# Patient Record
Sex: Male | Born: 1980 | Race: Black or African American | Hispanic: No | Marital: Married | Smoking: Former smoker
Health system: Southern US, Community
[De-identification: ages and names within clinical notes are randomized; demographics above are authoritative.]

## PROBLEM LIST (undated history)

## (undated) ENCOUNTER — Emergency Department (HOSPITAL_COMMUNITY): Admission: EM | Payer: Self-pay | Source: Home / Self Care

## (undated) DIAGNOSIS — F209 Schizophrenia, unspecified: Secondary | ICD-10-CM

## (undated) DIAGNOSIS — I10 Essential (primary) hypertension: Secondary | ICD-10-CM

---

## 2002-01-07 ENCOUNTER — Encounter: Payer: Self-pay | Admitting: Emergency Medicine

## 2002-01-07 ENCOUNTER — Emergency Department (HOSPITAL_COMMUNITY): Admission: EM | Admit: 2002-01-07 | Discharge: 2002-01-08 | Payer: Self-pay | Admitting: Emergency Medicine

## 2003-02-15 ENCOUNTER — Emergency Department (HOSPITAL_COMMUNITY): Admission: EM | Admit: 2003-02-15 | Discharge: 2003-02-15 | Payer: Self-pay

## 2003-06-10 ENCOUNTER — Inpatient Hospital Stay (HOSPITAL_COMMUNITY): Admission: EM | Admit: 2003-06-10 | Discharge: 2003-06-12 | Payer: Self-pay | Admitting: Emergency Medicine

## 2003-06-16 ENCOUNTER — Encounter: Admission: RE | Admit: 2003-06-16 | Discharge: 2003-08-20 | Payer: Self-pay | Admitting: Orthopedic Surgery

## 2003-08-12 ENCOUNTER — Ambulatory Visit (HOSPITAL_BASED_OUTPATIENT_CLINIC_OR_DEPARTMENT_OTHER): Admission: RE | Admit: 2003-08-12 | Discharge: 2003-08-12 | Payer: Self-pay | Admitting: Orthopedic Surgery

## 2003-08-12 ENCOUNTER — Ambulatory Visit (HOSPITAL_COMMUNITY): Admission: RE | Admit: 2003-08-12 | Discharge: 2003-08-12 | Payer: Self-pay | Admitting: Orthopedic Surgery

## 2003-09-03 ENCOUNTER — Encounter: Admission: RE | Admit: 2003-09-03 | Discharge: 2003-12-02 | Payer: Self-pay | Admitting: Orthopedic Surgery

## 2003-09-23 ENCOUNTER — Emergency Department (HOSPITAL_COMMUNITY): Admission: EM | Admit: 2003-09-23 | Discharge: 2003-09-24 | Payer: Self-pay | Admitting: Emergency Medicine

## 2003-12-21 ENCOUNTER — Emergency Department (HOSPITAL_COMMUNITY): Admission: EM | Admit: 2003-12-21 | Discharge: 2003-12-21 | Payer: Self-pay | Admitting: Emergency Medicine

## 2004-02-01 ENCOUNTER — Emergency Department (HOSPITAL_COMMUNITY): Admission: EM | Admit: 2004-02-01 | Discharge: 2004-02-02 | Payer: Self-pay | Admitting: Emergency Medicine

## 2004-02-11 ENCOUNTER — Emergency Department (HOSPITAL_COMMUNITY): Admission: EM | Admit: 2004-02-11 | Discharge: 2004-02-11 | Payer: Self-pay

## 2004-05-30 ENCOUNTER — Emergency Department (HOSPITAL_COMMUNITY): Admission: EM | Admit: 2004-05-30 | Discharge: 2004-05-30 | Payer: Self-pay | Admitting: Emergency Medicine

## 2004-09-11 ENCOUNTER — Emergency Department (HOSPITAL_COMMUNITY): Admission: EM | Admit: 2004-09-11 | Discharge: 2004-09-11 | Payer: Self-pay | Admitting: Emergency Medicine

## 2004-10-19 ENCOUNTER — Emergency Department (HOSPITAL_COMMUNITY): Admission: EM | Admit: 2004-10-19 | Discharge: 2004-10-19 | Payer: Self-pay | Admitting: Emergency Medicine

## 2004-10-21 ENCOUNTER — Emergency Department (HOSPITAL_COMMUNITY): Admission: EM | Admit: 2004-10-21 | Discharge: 2004-10-21 | Payer: Self-pay | Admitting: Emergency Medicine

## 2004-10-23 ENCOUNTER — Emergency Department (HOSPITAL_COMMUNITY): Admission: EM | Admit: 2004-10-23 | Discharge: 2004-10-23 | Payer: Self-pay | Admitting: Emergency Medicine

## 2004-12-13 ENCOUNTER — Emergency Department (HOSPITAL_COMMUNITY): Admission: EM | Admit: 2004-12-13 | Discharge: 2004-12-13 | Payer: Self-pay | Admitting: Emergency Medicine

## 2005-03-29 IMAGING — CR DG ANKLE COMPLETE 3+V*R*
2 series · 2 of 2 positions shown · non-contrast
Comparison: 09/24/03.

CLINICAL DATA: Injury to ankle with pain, swelling.
 RIGHT ANKLE THREE VIEWS

[view not recorded (1 of 2)]
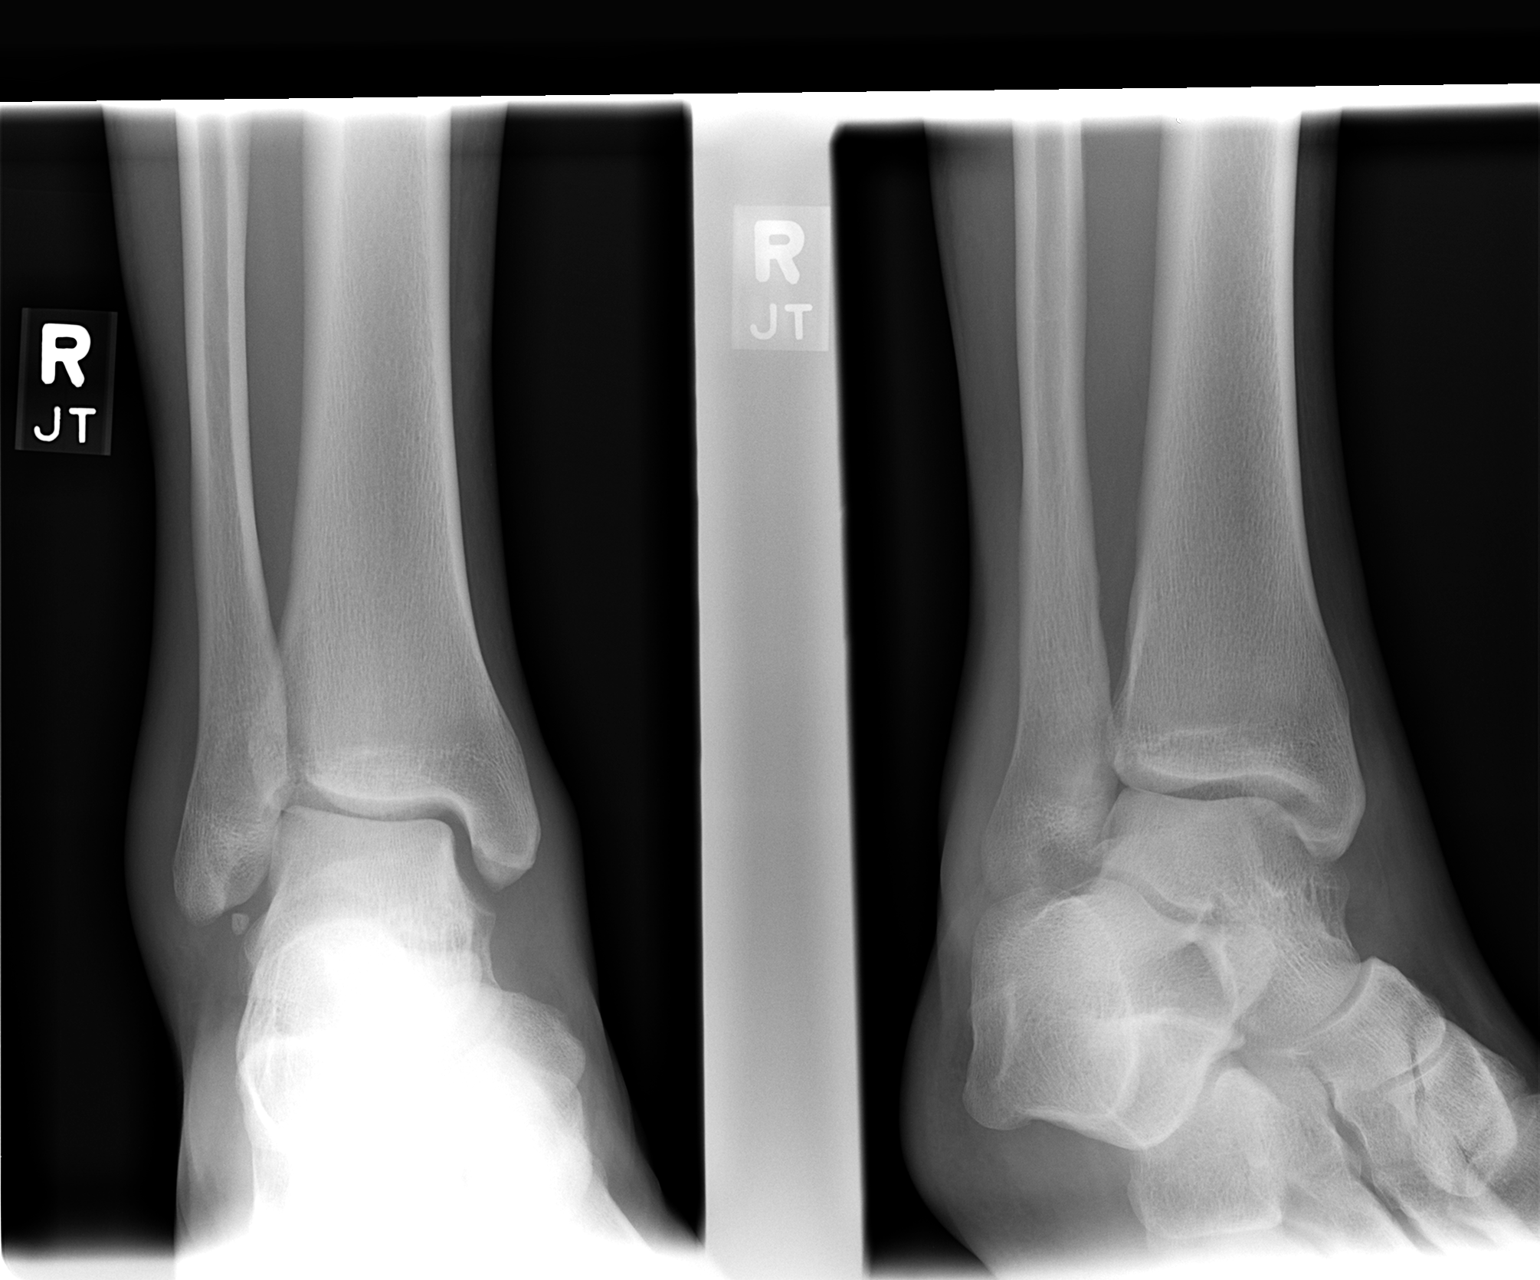

[view not recorded (2 of 2)]
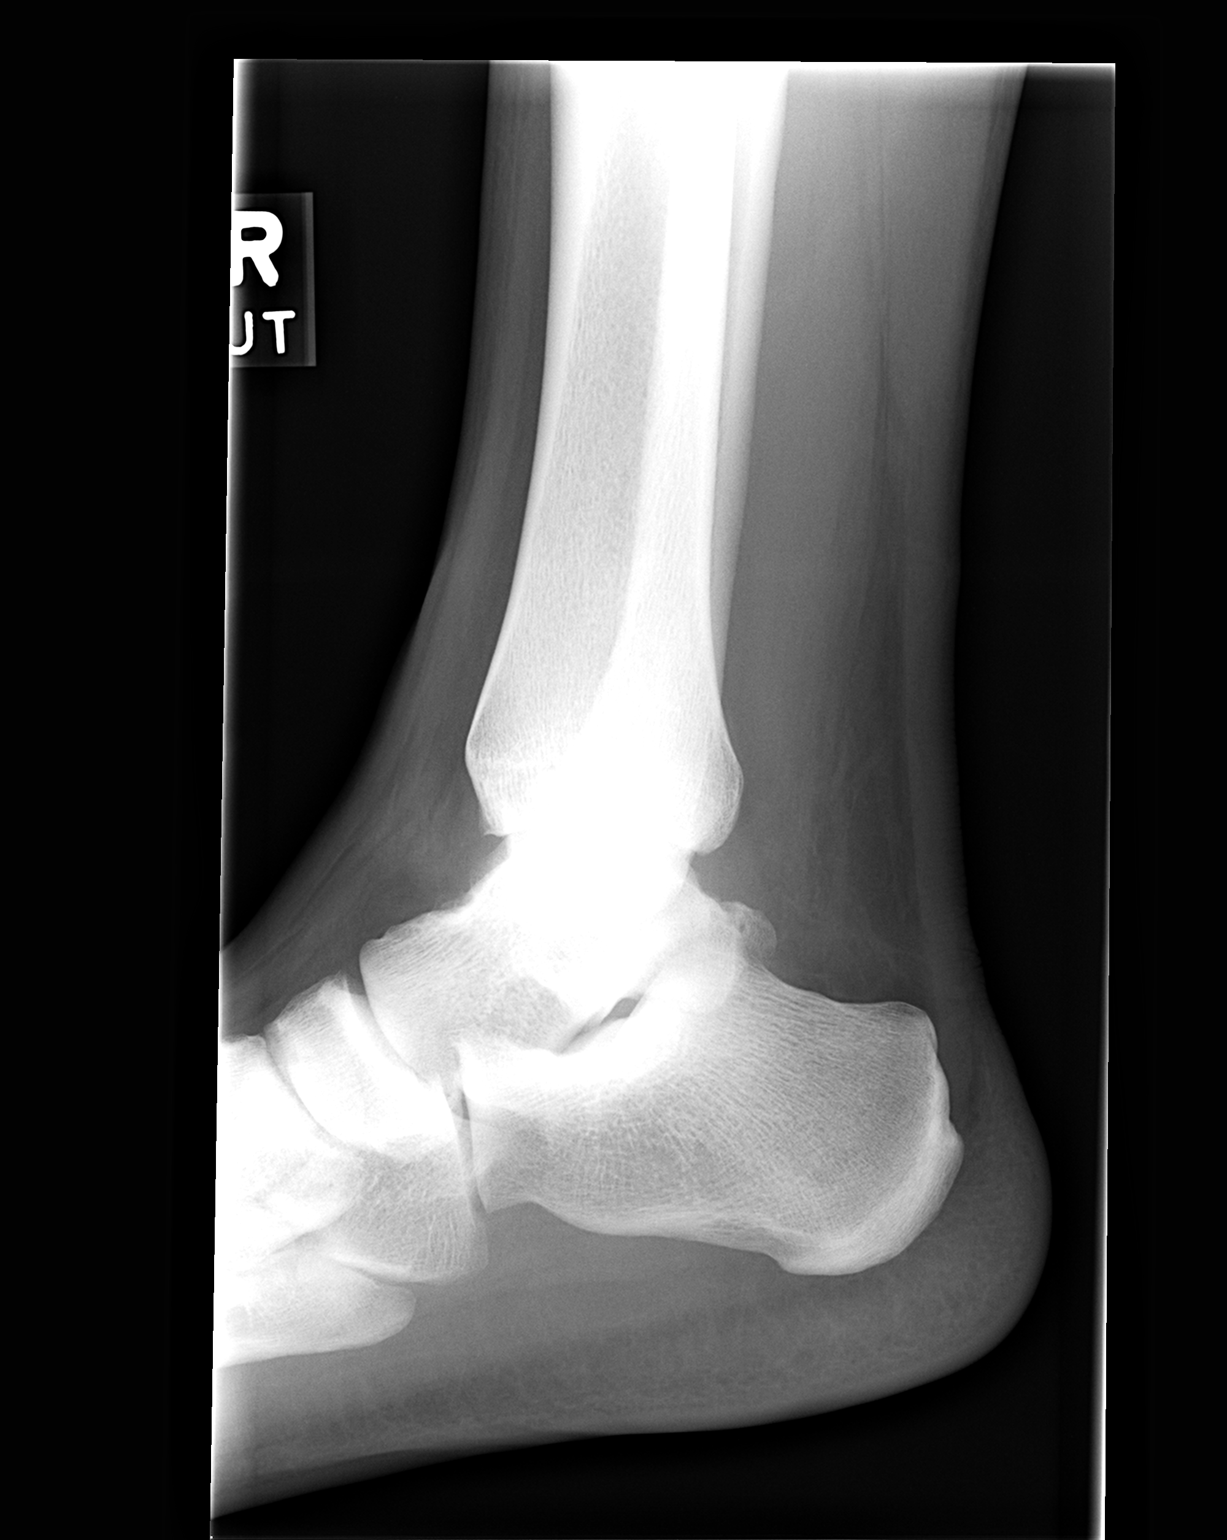

[2 of 2 positions shown; findings below may reference images not displayed]

Soft tissue swelling is noted without evidence of acute fracture, subluxation or dislocation.  Ankle effusion is noted.  Small bony density at the tip of the fibula is unchanged.
 IMPRESSION
 Soft tissue swelling and ankle effusion without evidence of acute bony abnormality.

## 2006-08-06 ENCOUNTER — Emergency Department (HOSPITAL_COMMUNITY): Admission: EM | Admit: 2006-08-06 | Discharge: 2006-08-06 | Payer: Self-pay | Admitting: Emergency Medicine

## 2006-08-20 ENCOUNTER — Emergency Department (HOSPITAL_COMMUNITY): Admission: EM | Admit: 2006-08-20 | Discharge: 2006-08-20 | Payer: Self-pay | Admitting: Emergency Medicine

## 2006-08-21 ENCOUNTER — Emergency Department (HOSPITAL_COMMUNITY): Admission: EM | Admit: 2006-08-21 | Discharge: 2006-08-22 | Payer: Self-pay | Admitting: Emergency Medicine

## 2006-09-24 ENCOUNTER — Emergency Department (HOSPITAL_COMMUNITY): Admission: EM | Admit: 2006-09-24 | Discharge: 2006-09-24 | Payer: Self-pay | Admitting: Emergency Medicine

## 2006-09-30 ENCOUNTER — Emergency Department (HOSPITAL_COMMUNITY): Admission: EM | Admit: 2006-09-30 | Discharge: 2006-09-30 | Payer: Self-pay | Admitting: Emergency Medicine

## 2019-02-20 ENCOUNTER — Encounter (HOSPITAL_COMMUNITY): Payer: Self-pay | Admitting: Emergency Medicine

## 2019-02-20 ENCOUNTER — Other Ambulatory Visit: Payer: Self-pay

## 2019-02-20 ENCOUNTER — Emergency Department (HOSPITAL_COMMUNITY)
Admission: EM | Admit: 2019-02-20 | Discharge: 2019-02-20 | Disposition: A | Discharge status: Home / Self Care | Payer: Self-pay | Attending: Emergency Medicine | Admitting: Emergency Medicine

## 2019-02-20 DIAGNOSIS — F17210 Nicotine dependence, cigarettes, uncomplicated: Secondary | ICD-10-CM | POA: Insufficient documentation

## 2019-02-20 DIAGNOSIS — X58XXXA Exposure to other specified factors, initial encounter: Secondary | ICD-10-CM | POA: Insufficient documentation

## 2019-02-20 DIAGNOSIS — Y929 Unspecified place or not applicable: Secondary | ICD-10-CM | POA: Insufficient documentation

## 2019-02-20 DIAGNOSIS — I10 Essential (primary) hypertension: Secondary | ICD-10-CM | POA: Insufficient documentation

## 2019-02-20 DIAGNOSIS — T18198A Other foreign object in esophagus causing other injury, initial encounter: Secondary | ICD-10-CM | POA: Insufficient documentation

## 2019-02-20 DIAGNOSIS — T18108A Unspecified foreign body in esophagus causing other injury, initial encounter: Secondary | ICD-10-CM

## 2019-02-20 DIAGNOSIS — Y9389 Activity, other specified: Secondary | ICD-10-CM | POA: Insufficient documentation

## 2019-02-20 DIAGNOSIS — Y998 Other external cause status: Secondary | ICD-10-CM | POA: Insufficient documentation

## 2019-02-20 HISTORY — DX: Essential (primary) hypertension: I10

## 2019-02-20 HISTORY — DX: Schizophrenia, unspecified: F20.9

## 2019-02-20 NOTE — ED Notes (Signed)
Pt arrived during downtime. Please refer to paper chart for triage and progress notes.

## 2019-02-20 NOTE — ED Provider Notes (Signed)
   [facility] DEPT Provider Note: Georgena Spurling, MD, FACEP  CSN: [id number] MRN: [id number] ARRIVAL: 02/20/19 at [facility]: RESB/RESB   CHIEF COMPLAINT  Foreign Body in [place]  02/20/19 2:00 AM Aaron Rubio is a 38 y.o. male with a history of schizophrenia.  He states he has a piece of sausage caught in his upper throat.  He is having discomfort associated with this which he rates as an 8 out of 10.  He has not been able to relieve the obstruction despite attempting to drink fluids or by vomiting.  He has had no difficulty breathing and EMS reports he has been talkative prior to arrival.    Past Medical History:  Diagnosis Date  . Hypertension   . Schizophrenia ([place])     History reviewed. No pertinent surgical history.  No family history on file.  Social History   Tobacco Use  . Smoking status: Current Every Day Smoker    Packs/day: 0.30    Types: Cigarettes  Substance Use Topics  . Alcohol use: Not Currently  . Drug use: Not Currently    Prior to Admission medications   Not on File    Allergies Patient has no allergy information on record.   REVIEW OF SYSTEMS  Negative except as noted here or in the History of Present Illness.   PHYSICAL EXAMINATION  Initial Vital Signs Blood pressure (!) 93/53, pulse 74, resp. rate (!) 24, SpO2 97 %.  Examination General: Well-developed, well-nourished male in no acute distress; appearance consistent with age of record HENT: normocephalic; atraumatic; no dysphonia; no stridor; no foreign body seen in oropharynx Eyes: Normal appearance Neck: supple Heart: regular rate and rhythm Lungs: clear to auscultation bilaterally Abdomen: soft; nondistended; nontender; bowel sounds present Extremities: No deformity; full range of motion Neurologic: Awake, alert; motor function intact in all extremities and symmetric; no facial droop Skin: Warm and dry Psychiatric: Mildly agitated    RESULTS  Summary of this visit's results, reviewed by myself:   EKG Interpretation  Date/Time:    Ventricular Rate:    PR Interval:    QRS Duration:   QT Interval:    QTC Calculation:   R Axis:     Text Interpretation:        Laboratory Studies: No results found for this or any previous visit (from the past 24 hour(s)). Imaging Studies: No results found.  ED COURSE and MDM  Nursing notes and initial vitals signs, including pulse oximetry, reviewed.  Vitals:   02/20/19 0537  BP: (!) 93/53  Pulse: 74  Resp: (!) 24  SpO2: 97%   6:08 AM Patient's obstruction relieved with IV glucagon.  He is now sleeping peacefully and has been able to drink fluids without difficulty.  PROCEDURES    ED DIAGNOSES     ICD-10-CM   1. Foreign body in esophagus, initial encounter  T18.108A        Shanon Rosser, MD 02/20/19 [phone]

## 2019-02-20 NOTE — ED Notes (Signed)
Pt given water. Able to tolerate well.

## 2021-07-14 ENCOUNTER — Emergency Department (HOSPITAL_COMMUNITY)
Admission: EM | Admit: 2021-07-14 | Discharge: 2021-07-15 | Disposition: A | Discharge status: Home / Self Care | Payer: Self-pay | Attending: Emergency Medicine | Admitting: Emergency Medicine

## 2021-07-14 ENCOUNTER — Other Ambulatory Visit: Payer: Self-pay

## 2021-07-14 ENCOUNTER — Emergency Department (HOSPITAL_COMMUNITY): Payer: Self-pay

## 2021-07-14 ENCOUNTER — Encounter (HOSPITAL_COMMUNITY): Payer: Self-pay

## 2021-07-14 DIAGNOSIS — R42 Dizziness and giddiness: Secondary | ICD-10-CM | POA: Insufficient documentation

## 2021-07-14 DIAGNOSIS — T148XXA Other injury of unspecified body region, initial encounter: Secondary | ICD-10-CM

## 2021-07-14 DIAGNOSIS — X58XXXA Exposure to other specified factors, initial encounter: Secondary | ICD-10-CM | POA: Insufficient documentation

## 2021-07-14 DIAGNOSIS — S90829A Blister (nonthermal), unspecified foot, initial encounter: Secondary | ICD-10-CM

## 2021-07-14 DIAGNOSIS — S90821A Blister (nonthermal), right foot, initial encounter: Secondary | ICD-10-CM | POA: Insufficient documentation

## 2021-07-14 DIAGNOSIS — S90822A Blister (nonthermal), left foot, initial encounter: Secondary | ICD-10-CM | POA: Insufficient documentation

## 2021-07-14 LAB — CBC WITH DIFFERENTIAL/PLATELET
Abs Immature Granulocytes: 0.1 10*3/uL — ABNORMAL HIGH (ref 0.00–0.07)
Basophils Absolute: 0.1 10*3/uL (ref 0.0–0.1)
Basophils Relative: 0 %
Eosinophils Absolute: 0 10*3/uL (ref 0.0–0.5)
Eosinophils Relative: 0 %
HCT: 46.2 % (ref 39.0–52.0)
Hemoglobin: 14.4 g/dL (ref 13.0–17.0)
Immature Granulocytes: 1 %
Lymphocytes Relative: 8 %
Lymphs Abs: 1.5 10*3/uL (ref 0.7–4.0)
MCH: 25.9 pg — ABNORMAL LOW (ref 26.0–34.0)
MCHC: 31.2 g/dL (ref 30.0–36.0)
MCV: 83.1 fL (ref 80.0–100.0)
Monocytes Absolute: 1.8 10*3/uL — ABNORMAL HIGH (ref 0.1–1.0)
Monocytes Relative: 10 %
Neutro Abs: 14 10*3/uL — ABNORMAL HIGH (ref 1.7–7.7)
Neutrophils Relative %: 81 %
Platelets: 222 10*3/uL (ref 150–400)
RBC: 5.56 MIL/uL (ref 4.22–5.81)
RDW: 13.1 % (ref 11.5–15.5)
WBC: 17.4 10*3/uL — ABNORMAL HIGH (ref 4.0–10.5)
nRBC: 0 % (ref 0.0–0.2)

## 2021-07-14 LAB — BASIC METABOLIC PANEL
Anion gap: 11 (ref 5–15)
BUN: 30 mg/dL — ABNORMAL HIGH (ref 6–20)
CO2: 22 mmol/L (ref 22–32)
Calcium: 9 mg/dL (ref 8.9–10.3)
Chloride: 104 mmol/L (ref 98–111)
Creatinine, Ser: 1.38 mg/dL — ABNORMAL HIGH (ref 0.61–1.24)
GFR, Estimated: >60 mL/min (ref >60)
Glucose, Bld: 98 mg/dL (ref 70–99)
Potassium: 4 mmol/L (ref 3.5–5.1)
Sodium: 137 mmol/L (ref 135–145)

## 2021-07-14 NOTE — ED Provider Triage Note (Signed)
Emergency Medicine Provider Triage Evaluation Note  Aaron Rubio , a 41 y.o. male  was evaluated in triage.  Pt complains of bilateral leg/foot pain.  Patient states that he was released from jail yesterday around 5:08 PM and walked here from either [place] or [place].  He states while walking he began experiencing pain.  Patient does not elaborate why he was walking here however states that once he started having pain he knew he needed to come to this specific ED for further evaluation of his pain.  He also states that he needs medications.  He states he needs 1 for sleep and 1 for blood pressure however he is unable to tell me what medication he is on.  He states that he was not released from jail with his medications and that someone needs to call to figure out what medicines that he is on.   Review of Systems  Positive: + foot/leg pain Negative: Unable to really decipher ROS. PT poor historian  Physical Exam  BP (!) 159/103 (BP Location: Left Arm)    Pulse (!) 134    Resp 18    SpO2 100%  Gen:   Awake, no distress   Resp:  Normal effort  MSK:   Moves extremities without difficulty  Other:  Blister noted to R toe between 1st and 2nd digits. Mild TTP throughout bilateral feet. 2+ DP Pulses bilaterally.  Tachycardic.   Medical Decision Making  Medically screening exam initiated at 4:47 PM.  Appropriate orders placed.  Wilmar Prabhakar was informed that the remainder of the evaluation will be completed by another provider, this initial triage assessment does not replace that evaluation, and the importance of remaining in the ED until their evaluation is complete.     Tanda Rockers, PA-C 07/14/21 1649

## 2021-07-14 NOTE — ED Triage Notes (Signed)
Pt reports he was released from a [place] jail yesterday 5pm and has been walking to [place] since yesterday. He states around 3 hours of walking yesterday evening he started having leg and foot pain and states it now hurts to walk and reports bruising to his feet. No bruises noted on his feet during triage assessment. Pt continues to change his story, hard to decipher what he is complaining of.

## 2021-07-14 NOTE — ED Provider Notes (Addendum)
[facility]-EMERGENCY DEPT Provider Note   CSN: [id number] Arrival date & time: 07/14/21  1531     History  Chief Complaint  Patient presents with   Dizziness    Leg pain   Leg Pain    Aaron Rubio is a 41 y.o. male.  HPI Patient presents to the ED after walking to [facility] from [facility].  Initially he complained about foot pain.  Later he stated that he was here just to get his medicines.  He was released from jail yesterday.  He cannot recall which medicines he is supposed to take.    Home Medications Prior to Admission medications   Not on File      Allergies    Patient has no known allergies.    Review of Systems   Review of Systems  All other systems reviewed and are negative.  Physical Exam Updated Vital Signs BP 97/60    Pulse (!) 102    Temp 98.5 F (36.9 C) (Oral)    Resp 18    Ht 6\' 2"  (1.88 m)    Wt 79.8 kg    SpO2 99%    BMI 22.60 kg/m  Physical Exam Vitals and nursing note reviewed.  Constitutional:      General: He is not in acute distress.    Appearance: He is well-developed. He is not ill-appearing or diaphoretic.  HENT:     Head: Normocephalic and atraumatic.     Right Ear: External ear normal.     Left Ear: External ear normal.  Eyes:     Conjunctiva/sclera: Conjunctivae normal.     Pupils: Pupils are equal, round, and reactive to light.  Neck:     Trachea: Phonation normal.  Cardiovascular:     Rate and Rhythm: Normal rate.  Pulmonary:     Effort: Pulmonary effort is normal.  Abdominal:     General: There is no distension.     Tenderness: There is abdominal tenderness.  Musculoskeletal:        General: Normal range of motion.     Cervical back: Normal range of motion and neck supple.  Skin:    General: Skin is warm and dry.     Comments: He has a blister on the right great toe and left plantar forefoot.  The blister is intact, not bleeding and around 2 to 3 cm.  Neurological:     Mental  Status: He is alert and oriented to person, place, and time.     Cranial Nerves: No cranial nerve deficit.     Sensory: No sensory deficit.     Motor: No abnormal muscle tone.     Coordination: Coordination normal.     Comments: No dysarthria or aphasia.  Psychiatric:        Mood and Affect: Mood normal.        Behavior: Behavior normal.        Thought Content: Thought content normal.        Judgment: Judgment normal.     Comments: No internal responsiveness    ED Results / Procedures / Treatments   Labs (all labs ordered are listed, but only abnormal results are displayed) Labs Reviewed  BASIC METABOLIC PANEL - Abnormal; Notable for the following components:      Result Value   BUN 30 (*)    Creatinine, Ser 1.38 (*)    All other components within normal limits  CBC WITH DIFFERENTIAL/PLATELET - Abnormal; Notable for the  following components:   WBC 17.4 (*)    MCH 25.9 (*)    Neutro Abs 14.0 (*)    Monocytes Absolute 1.8 (*)    Abs Immature Granulocytes 0.10 (*)    All other components within normal limits  URINALYSIS, ROUTINE W REFLEX MICROSCOPIC    EKG None  Radiology DG Foot Complete Left  Result Date: 07/14/2021 CLINICAL DATA:  Pain without trauma EXAM: LEFT FOOT - COMPLETE 3+ VIEW COMPARISON:  None. FINDINGS: Mild hallux valgus deformity. No acute fracture or dislocation. No periosteal reaction or callus deposition. IMPRESSION: No acute osseous abnormality. Electronically Signed   By: Jeronimo Greaves M.D.   On: 07/14/2021 17:30   DG Foot Complete Right  Result Date: 07/14/2021 CLINICAL DATA:  Bilateral foot pain EXAM: RIGHT FOOT COMPLETE - 3+ VIEW COMPARISON:  None. FINDINGS: There is no evidence of fracture or dislocation. There is no evidence of arthropathy or other focal bone abnormality. Soft tissues are unremarkable. IMPRESSION: Negative. Electronically Signed   By: Larose Hires D.O.   On: 07/14/2021 17:32    Procedures Procedures    Medications Ordered in  ED Medications - No data to display  ED Course/ Medical Decision Making/ A&P                           Medical Decision Making Patient complains of painful feet and being out of his medicine.  He has blisters on his feet.  Amount and/or Complexity of Data Reviewed Labs: ordered.    Details: Mild elevation of BUN and creatinine, White count high. No intervention needed. ECG/medicine tests: ordered and independent interpretation performed.    Details: No fractures Discussion of management or test interpretation with external provider(s): Patient was homeless, has walked long distance and arrived to the ED complaining of foot pain and being out of his medicine.  He is stable for discharge after assistance with medicine prescriptions.  Risk Prescription drug management. Diagnosis or treatment significantly limited by social determinants of health. Risk Details: Homelessness.  Required prescriptions.         Final Clinical Impression(s) / ED Diagnoses Final diagnoses:  Blister of foot, unspecified laterality, initial encounter    Rx / DC Orders ED Discharge Orders     None         Mancel Bale, MD 07/15/21 [phone]

## 2021-07-15 ENCOUNTER — Other Ambulatory Visit: Payer: Self-pay

## 2021-07-15 ENCOUNTER — Emergency Department (HOSPITAL_COMMUNITY)
Admission: EM | Admit: 2021-07-15 | Discharge: 2021-07-15 | Disposition: A | Discharge status: Home / Self Care | Payer: Self-pay | Attending: Emergency Medicine | Admitting: Emergency Medicine

## 2021-07-15 ENCOUNTER — Encounter (HOSPITAL_COMMUNITY): Payer: Self-pay

## 2021-07-15 DIAGNOSIS — E860 Dehydration: Secondary | ICD-10-CM | POA: Insufficient documentation

## 2021-07-15 DIAGNOSIS — R238 Other skin changes: Secondary | ICD-10-CM

## 2021-07-15 MED ORDER — BUSPIRONE HCL 30 MG PO TABS: 30.0000 mg | ORAL_TABLET | Freq: Two times a day (BID) | ORAL | 0 refills | Status: AC

## 2021-07-15 MED ORDER — BENZTROPINE MESYLATE 2 MG PO TABS: 2.0000 mg | ORAL_TABLET | Freq: Two times a day (BID) | ORAL | 0 refills | Status: DC

## 2021-07-15 MED ORDER — HYDROXYZINE HCL 25 MG PO TABS: 25.0000 mg | ORAL_TABLET | ORAL | 0 refills | Status: DC

## 2021-07-15 MED ORDER — METOPROLOL TARTRATE 25 MG PO TABS: 25.0000 mg | ORAL_TABLET | Freq: Every morning | ORAL | 0 refills | Status: DC

## 2021-07-15 NOTE — ED Notes (Signed)
Prescriptions reviewed along with discharge information.

## 2021-07-15 NOTE — ED Provider Notes (Signed)
[facility] DEPT Provider Note   CSN: [id number] Arrival date & time: 07/15/21  2115     History  Chief Complaint  Patient presents with   Dehydration    Graciano Nixson Butta is a 41 y.o. male.  41 year old male returns the emergency room with complaint of blisters to his toes, states that he has delicate skin.  Patient states he is recently released from jail and has nowhere to go.  Patient reports poor hydration and nutrition for several years.  Currently drinking a soda.  Seen in the emergency room and discharged early this morning.      Home Medications Prior to Admission medications   Medication Sig Start Date End Date Taking? Authorizing Provider  benztropine (COGENTIN) 2 MG tablet Take 1 tablet (2 mg total) by mouth 2 (two) times daily. 07/15/21 08/14/21  Long, Wonda Olds, MD  busPIRone (BUSPAR) 30 MG tablet Take 1 tablet (30 mg total) by mouth 2 (two) times daily. 07/15/21 08/14/21  Long, Wonda Olds, MD  hydrOXYzine (ATARAX) 25 MG tablet Take 1-2 tablets (25-50 mg total) by mouth See admin instructions. Take 25 mg by mouth in the morning and 50 mg at bedtime 07/15/21   Long, Wonda Olds, MD  metoprolol tartrate (LOPRESSOR) 25 MG tablet Take 1 tablet (25 mg total) by mouth in the morning. 07/15/21 08/14/21  Long, Wonda Olds, MD      Allergies    Patient has no known allergies.    Review of Systems   Review of Systems  Constitutional:  Negative for fever.  Respiratory:  Negative for shortness of breath.   Cardiovascular:  Negative for chest pain.  Gastrointestinal:  Negative for abdominal pain, nausea and vomiting.  Musculoskeletal:  Negative for arthralgias and myalgias.  Skin:  Positive for wound.  Allergic/Immunologic: Negative for immunocompromised state.  Neurological:  Negative for weakness.  Hematological:  Negative for adenopathy.  Psychiatric/Behavioral:  Negative for confusion.    Physical Exam Updated Vital Signs BP (!) 153/106 (BP  Location: Right Arm)    Pulse (!) 114    Temp 97.8 F (36.6 C) (Oral)    Resp 20    Ht 6\' 2"  (1.88 m)    Wt 81.6 kg    SpO2 100%    BMI 23.11 kg/m  Physical Exam Vitals and nursing note reviewed.  Constitutional:      General: He is not in acute distress.    Appearance: He is well-developed. He is not diaphoretic.  HENT:     Head: Normocephalic and atraumatic.  Cardiovascular:     Pulses: Normal pulses.  Pulmonary:     Effort: Pulmonary effort is normal.  Skin:    General: Skin is warm and dry.     Findings: No erythema or rash.     Comments: Blister to left and right great toe without evidence of secondary infection.  Neurological:     Mental Status: He is alert and oriented to person, place, and time.     Motor: No weakness.     Gait: Gait normal.  Psychiatric:        Behavior: Behavior normal.    ED Results / Procedures / Treatments   Labs (all labs ordered are listed, but only abnormal results are displayed) Labs Reviewed - No data to display  EKG None  Radiology DG Foot Complete Left  Result Date: 07/14/2021 CLINICAL DATA:  Pain without trauma EXAM: LEFT FOOT - COMPLETE 3+ VIEW COMPARISON:  None. FINDINGS: Mild hallux  valgus deformity. No acute fracture or dislocation. No periosteal reaction or callus deposition. IMPRESSION: No acute osseous abnormality. Electronically Signed   By: Abigail Miyamoto M.D.   On: 07/14/2021 17:30   DG Foot Complete Right  Result Date: 07/14/2021 CLINICAL DATA:  Bilateral foot pain EXAM: RIGHT FOOT COMPLETE - 3+ VIEW COMPARISON:  None. FINDINGS: There is no evidence of fracture or dislocation. There is no evidence of arthropathy or other focal bone abnormality. Soft tissues are unremarkable. IMPRESSION: Negative. Electronically Signed   By: Keane Police D.O.   On: 07/14/2021 17:32    Procedures Procedures    Medications Ordered in ED Medications - No data to display  ED Course/ Medical Decision Making/ A&P                            Medical Decision Making 41 year old male returns to the ER with ongoing complaint of blisters to his feet.  X-ray reviewed from yesterday, no acute osseous abnormalities.  Plan is to dress his wounds, provide socks and discharge with shelter resource list.  Patient states that he is here for dinner as he has nowhere else to go.          Final Clinical Impression(s) / ED Diagnoses Final diagnoses:  Blisters of multiple sites    Rx / DC Orders ED Discharge Orders     None         Roque Lias 07/15/21 2212    Daleen Bo, MD 07/15/21 2226

## 2021-07-15 NOTE — Discharge Instructions (Signed)
Try to wear socks and supportive shoes to help your blisters improve.  Follow-up with the doctor of your choice for further care and treatment as needed.

## 2021-07-15 NOTE — ED Triage Notes (Addendum)
Patient feeling dehydrated. Was seen yesterday. He blistered his feet very badly. He is trying to get stomach soother too. He is asking if we fed dinner for tonight yet.

## 2021-07-15 NOTE — ED Provider Notes (Signed)
Blood pressure (!) 95/52, pulse (!) 103, temperature 98.5 F (36.9 C), temperature source Oral, resp. rate 18, height 6\' 2"  (1.88 m), weight 79.8 kg, SpO2 98 %.  Assuming care from Dr. .  In short, Aaron Rubio is a 41 y.o. male with a chief complaint of Dizziness (Leg pain) and Leg Pain .  Refer to the original H&P for additional details.  The current plan of care is to follow up on medication list from jail and d/c with Rx and PCP follow up information.  04:00 AM  Patient's med list faxed over and added to our system. Provided Rx for 30 days of medication and contact information for local PCP. Patient is stable for discharge.     41, MD 07/15/21 [phone]

## 2021-07-26 DIAGNOSIS — I10 Essential (primary) hypertension: Secondary | ICD-10-CM | POA: Insufficient documentation

## 2021-10-19 DIAGNOSIS — F1590 Other stimulant use, unspecified, uncomplicated: Secondary | ICD-10-CM | POA: Insufficient documentation

## 2022-05-10 ENCOUNTER — Encounter (HOSPITAL_COMMUNITY): Payer: Self-pay

## 2022-05-10 ENCOUNTER — Other Ambulatory Visit: Payer: Self-pay

## 2022-05-10 ENCOUNTER — Emergency Department (HOSPITAL_COMMUNITY)
Admission: EM | Admit: 2022-05-10 | Discharge: 2022-05-10 | Discharge status: Against Medical Advice | Payer: Self-pay | Attending: Emergency Medicine | Admitting: Emergency Medicine

## 2022-05-10 DIAGNOSIS — W1839XA Other fall on same level, initial encounter: Secondary | ICD-10-CM | POA: Insufficient documentation

## 2022-05-10 DIAGNOSIS — F1490 Cocaine use, unspecified, uncomplicated: Secondary | ICD-10-CM | POA: Insufficient documentation

## 2022-05-10 DIAGNOSIS — Z5321 Procedure and treatment not carried out due to patient leaving prior to being seen by health care provider: Secondary | ICD-10-CM | POA: Insufficient documentation

## 2022-05-10 LAB — CBC
HCT: 42.4 % (ref 39.0–52.0)
Hemoglobin: 13.8 g/dL (ref 13.0–17.0)
MCH: 27.1 pg (ref 26.0–34.0)
MCHC: 32.5 g/dL (ref 30.0–36.0)
MCV: 83.3 fL (ref 80.0–100.0)
Platelets: 262 10*3/uL (ref 150–400)
RBC: 5.09 MIL/uL (ref 4.22–5.81)
RDW: 13 % (ref 11.5–15.5)
WBC: 10.7 10*3/uL — ABNORMAL HIGH (ref 4.0–10.5)
nRBC: 0 % (ref 0.0–0.2)

## 2022-05-10 LAB — COMPREHENSIVE METABOLIC PANEL
ALT: 27 U/L (ref 0–44)
AST: 48 U/L — ABNORMAL HIGH (ref 15–41)
Albumin: 4.2 g/dL (ref 3.5–5.0)
Alkaline Phosphatase: 71 U/L (ref 38–126)
Anion gap: 6 (ref 5–15)
BUN: 27 mg/dL — ABNORMAL HIGH (ref 6–20)
CO2: 28 mmol/L (ref 22–32)
Calcium: 9.3 mg/dL (ref 8.9–10.3)
Chloride: 107 mmol/L (ref 98–111)
Creatinine, Ser: 1.43 mg/dL — ABNORMAL HIGH (ref 0.61–1.24)
GFR, Estimated: >60 mL/min (ref >60)
Glucose, Bld: 123 mg/dL — ABNORMAL HIGH (ref 70–99)
Potassium: 4.5 mmol/L (ref 3.5–5.1)
Sodium: 141 mmol/L (ref 135–145)
Total Bilirubin: 0.6 mg/dL (ref 0.3–1.2)
Total Protein: 6.8 g/dL (ref 6.5–8.1)

## 2022-05-10 LAB — ETHANOL: Alcohol, Ethyl (B): <10 mg/dL (ref <10)

## 2022-05-10 NOTE — ED Triage Notes (Signed)
[facility] Staff called because patient was acting strange in the lobby. Pt admits to using cocaine. Pt having auditory and visual hallucinations. No aggressive behavior noted. VSS for EMS. Pt reports hurting all over and took the cocaine to help it. EMS states patient had a fall at ground level.

## 2022-05-10 NOTE — ED Notes (Signed)
Pt is not present in the lobby for vital reassessment. Pt called x3.

## 2022-05-10 NOTE — ED Notes (Signed)
Pt seen exiting the ED lobby.

## 2022-05-10 NOTE — ED Notes (Signed)
Pt prompted/reminded urine sample needed.

## 2022-05-11 ENCOUNTER — Emergency Department (HOSPITAL_COMMUNITY): Payer: Self-pay

## 2022-05-11 ENCOUNTER — Other Ambulatory Visit: Payer: Self-pay

## 2022-05-11 ENCOUNTER — Emergency Department (HOSPITAL_COMMUNITY)
Admission: EM | Admit: 2022-05-11 | Discharge: 2022-05-11 | Discharge status: Against Medical Advice | Payer: Self-pay | Attending: Emergency Medicine | Admitting: Emergency Medicine

## 2022-05-11 ENCOUNTER — Emergency Department (HOSPITAL_COMMUNITY)
Admission: EM | Admit: 2022-05-11 | Discharge: 2022-05-11 | Disposition: A | Discharge status: Home / Self Care | Payer: Self-pay | Attending: Emergency Medicine | Admitting: Emergency Medicine

## 2022-05-11 ENCOUNTER — Encounter (HOSPITAL_COMMUNITY): Payer: Self-pay | Admitting: *Deleted

## 2022-05-11 DIAGNOSIS — E860 Dehydration: Secondary | ICD-10-CM | POA: Insufficient documentation

## 2022-05-11 DIAGNOSIS — W19XXXA Unspecified fall, initial encounter: Secondary | ICD-10-CM | POA: Insufficient documentation

## 2022-05-11 DIAGNOSIS — Z5321 Procedure and treatment not carried out due to patient leaving prior to being seen by health care provider: Secondary | ICD-10-CM | POA: Insufficient documentation

## 2022-05-11 DIAGNOSIS — S39012A Strain of muscle, fascia and tendon of lower back, initial encounter: Secondary | ICD-10-CM | POA: Insufficient documentation

## 2022-05-11 DIAGNOSIS — Z79899 Other long term (current) drug therapy: Secondary | ICD-10-CM | POA: Insufficient documentation

## 2022-05-11 DIAGNOSIS — R55 Syncope and collapse: Secondary | ICD-10-CM | POA: Insufficient documentation

## 2022-05-11 DIAGNOSIS — I10 Essential (primary) hypertension: Secondary | ICD-10-CM | POA: Insufficient documentation

## 2022-05-11 LAB — CBC WITH DIFFERENTIAL/PLATELET
Abs Immature Granulocytes: 0.03 10*3/uL (ref 0.00–0.07)
Basophils Absolute: 0 10*3/uL (ref 0.0–0.1)
Basophils Relative: 0 %
Eosinophils Absolute: 0.2 10*3/uL (ref 0.0–0.5)
Eosinophils Relative: 2 %
HCT: 42.9 % (ref 39.0–52.0)
Hemoglobin: 13.4 g/dL (ref 13.0–17.0)
Immature Granulocytes: 0 %
Lymphocytes Relative: 7 %
Lymphs Abs: 0.6 10*3/uL — ABNORMAL LOW (ref 0.7–4.0)
MCH: 26.5 pg (ref 26.0–34.0)
MCHC: 31.2 g/dL (ref 30.0–36.0)
MCV: 85 fL (ref 80.0–100.0)
Monocytes Absolute: 0.7 10*3/uL (ref 0.1–1.0)
Monocytes Relative: 7 %
Neutro Abs: 7.5 10*3/uL (ref 1.7–7.7)
Neutrophils Relative %: 84 %
Platelets: 230 10*3/uL (ref 150–400)
RBC: 5.05 MIL/uL (ref 4.22–5.81)
RDW: 13 % (ref 11.5–15.5)
WBC: 9.1 10*3/uL (ref 4.0–10.5)
nRBC: 0 % (ref 0.0–0.2)

## 2022-05-11 LAB — COMPREHENSIVE METABOLIC PANEL
ALT: 31 U/L (ref 0–44)
AST: 60 U/L — ABNORMAL HIGH (ref 15–41)
Albumin: 4 g/dL (ref 3.5–5.0)
Alkaline Phosphatase: 58 U/L (ref 38–126)
Anion gap: 9 (ref 5–15)
BUN: 21 mg/dL — ABNORMAL HIGH (ref 6–20)
CO2: 24 mmol/L (ref 22–32)
Calcium: 9.2 mg/dL (ref 8.9–10.3)
Chloride: 108 mmol/L (ref 98–111)
Creatinine, Ser: 1.35 mg/dL — ABNORMAL HIGH (ref 0.61–1.24)
GFR, Estimated: >60 mL/min (ref >60)
Glucose, Bld: 91 mg/dL (ref 70–99)
Potassium: 4 mmol/L (ref 3.5–5.1)
Sodium: 141 mmol/L (ref 135–145)
Total Bilirubin: 0.9 mg/dL (ref 0.3–1.2)
Total Protein: 6.3 g/dL — ABNORMAL LOW (ref 6.5–8.1)

## 2022-05-11 LAB — TROPONIN I (HIGH SENSITIVITY): Troponin I (High Sensitivity): 9 ng/L (ref <18)

## 2022-05-11 MED ORDER — NAPROXEN 375 MG PO TABS: 375.0000 mg | ORAL_TABLET | Freq: Two times a day (BID) | ORAL | 0 refills | Status: DC

## 2022-05-11 MED ORDER — SODIUM CHLORIDE 0.9 % IV BOLUS (SEPSIS)
1000.0000 mL | Freq: Once | INTRAVENOUS | Status: AC
  Administered 2022-05-11: 1000 mL via INTRAVENOUS

## 2022-05-11 NOTE — ED Notes (Signed)
Pt provided discharge instructions and prescription information. Pt was given the opportunity to ask questions and questions were answered.   

## 2022-05-11 NOTE — ED Provider Notes (Signed)
[facility] Provider Note   CSN: [id number] Arrival date & time: 05/11/22  1637     History  No chief complaint on file.   [facility] is a 41 y.o. male.  HPI   Patient has a history of hypertension and schizophrenia according to the medical records.  Patient presents to the ED for evaluation of feeling weak and dehydrated.  Patient states he had a syncopal episode.  He thinks it may have been related to the heat.  He has not been eating or drinking much the last couple of days.  Patient denies having any acute mental health problems.  Denies feeling suicidal or homicidal.  Denies feeling depressed  Home Medications Prior to Admission medications   Medication Sig Start Date End Date Taking? Authorizing Provider  naproxen (NAPROSYN) 375 MG tablet Take 1 tablet (375 mg total) by mouth 2 (two) times daily. 05/11/22  Yes Linwood Dibbles, MD  benztropine (COGENTIN) 2 MG tablet Take 1 tablet (2 mg total) by mouth 2 (two) times daily. 07/15/21 08/14/21  Long, Arlyss Repress, MD  hydrOXYzine (ATARAX) 25 MG tablet Take 1-2 tablets (25-50 mg total) by mouth See admin instructions. Take 25 mg by mouth in the morning and 50 mg at bedtime 07/15/21   Long, Arlyss Repress, MD  metoprolol tartrate (LOPRESSOR) 25 MG tablet Take 1 tablet (25 mg total) by mouth in the morning. 07/15/21 08/14/21  Long, Arlyss Repress, MD      Allergies    Patient has no known allergies.    Review of Systems   Review of Systems  Physical Exam Updated Vital Signs BP 127/83   Pulse 90   Temp 99.5 F (37.5 C)   Resp 16   SpO2 100%  Physical Exam Vitals and nursing note reviewed.  Constitutional:      General: He is not in acute distress.    Appearance: He is well-developed.  HENT:     Head: Normocephalic and atraumatic.     Comments: Mucous membranes dry    Right Ear: External ear normal.     Left Ear: External ear normal.  Eyes:     General: No scleral icterus.       Right eye: No  discharge.        Left eye: No discharge.     Conjunctiva/sclera: Conjunctivae normal.  Neck:     Trachea: No tracheal deviation.  Cardiovascular:     Rate and Rhythm: Normal rate and regular rhythm.  Pulmonary:     Effort: Pulmonary effort is normal. No respiratory distress.     Breath sounds: Normal breath sounds. No stridor. No wheezing or rales.  Abdominal:     General: Bowel sounds are normal. There is no distension.     Palpations: Abdomen is soft.     Tenderness: There is no abdominal tenderness. There is no guarding or rebound.  Musculoskeletal:        General: No tenderness or deformity.     Cervical back: Neck supple.     Comments: Mild tenderness palpation lumbar spine right hip  Skin:    General: Skin is warm and dry.     Findings: No rash.  Neurological:     General: No focal deficit present.     Mental Status: He is alert.     Cranial Nerves: No cranial nerve deficit (no facial droop, extraocular movements intact, no slurred speech).     Sensory: No sensory deficit.  Motor: No abnormal muscle tone or seizure activity.     Coordination: Coordination normal.  Psychiatric:        Mood and Affect: Affect is not flat, angry or inappropriate.        Speech: Speech is not delayed, slurred or tangential.        Behavior: Behavior is not aggressive or hyperactive.     Comments: Patient's thought process is somewhat tangential.  Speech is more rapid at times although with direct discussion he will slow down and answer questions appropriately     ED Results / Procedures / Treatments   Labs (all labs ordered are listed, but only abnormal results are displayed) Labs Reviewed  COMPREHENSIVE METABOLIC PANEL - Abnormal; Notable for the following components:      Result Value   BUN 21 (*)    Creatinine, Ser 1.35 (*)    Total Protein 6.3 (*)    AST 60 (*)    All other components within normal limits  CBC WITH DIFFERENTIAL/PLATELET - Abnormal; Notable for the following  components:   Lymphs Abs 0.6 (*)    All other components within normal limits  TROPONIN I (HIGH SENSITIVITY)    EKG EKG Interpretation  Date/Time:  Thursday May 11 2022 18:10:17 EST Ventricular Rate:  95 PR Interval:  147 QRS Duration: 87 QT Interval:  383 QTC Calculation: 482 R Axis:   -8 Text Interpretation: Sinus rhythm Probable left atrial enlargement ST elev, probable normal early repol pattern Borderline prolonged QT interval No significant change since last tracing Confirmed by Linwood Dibbles [phone]) on 05/11/2022 7:47:41 PM  Radiology CT Head Wo Contrast  Result Date: 05/11/2022 CLINICAL DATA:  Mental status change EXAM: CT HEAD WITHOUT CONTRAST TECHNIQUE: Contiguous axial images were obtained from the base of the skull through the vertex without intravenous contrast. RADIATION DOSE REDUCTION: This exam was performed according to the departmental dose-optimization program which includes automated exposure control, adjustment of the mA and/or kV according to patient size and/or use of iterative reconstruction technique. COMPARISON:  09/25/2006 FINDINGS: Brain: No evidence of acute infarction, hemorrhage, mass, mass effect, or midline shift. No hydrocephalus or extra-axial fluid collection. Vascular: No hyperdense vessel. Skull: Normal. Negative for fracture or focal lesion. Sinuses/Orbits: Mucosal thickening in the left-greater-than-right maxillary sinus, ethmoid air cells, and right frontal sinus. The orbits are unremarkable. Other: The mastoid air cells are well aerated. IMPRESSION: No acute intracranial process. Electronically Signed   By: Wiliam Ke M.D.   On: 05/11/2022 20:41   DG Hip Unilat W or Wo Pelvis 2-3 Views Right  Result Date: 05/11/2022 CLINICAL DATA:  Fall, right hip pain EXAM: DG HIP (WITH OR WITHOUT PELVIS) 2-3V RIGHT COMPARISON:  None Available. FINDINGS: There is no evidence of hip fracture or dislocation. There is no evidence of arthropathy or other focal bone  abnormality. IMPRESSION: Negative. Electronically Signed   By: Helyn Numbers M.D.   On: 05/11/2022 19:55   DG Lumbar Spine Complete  Result Date: 05/11/2022 CLINICAL DATA:  Fall, back pain EXAM: LUMBAR SPINE - COMPLETE 4+ VIEW COMPARISON:  None Available. FINDINGS: There is no evidence of lumbar spine fracture. Alignment is normal. Intervertebral disc spaces are maintained. IMPRESSION: Negative. Electronically Signed   By: Helyn Numbers M.D.   On: 05/11/2022 19:53   DG Chest 1 View  Result Date: 05/11/2022 CLINICAL DATA:  Chest pain EXAM: CHEST  1 VIEW COMPARISON:  Chest 08/21/2006 FINDINGS: The heart size and mediastinal contours are within normal limits. Both lungs  are clear. The visualized skeletal structures are unremarkable. IMPRESSION: No active disease. Electronically Signed   By: Marlan Palau M.D.   On: 05/11/2022 17:44    Procedures Procedures    Medications Ordered in ED Medications  sodium chloride 0.9 % bolus 1,000 mL (1,000 mLs Intravenous New Bag/Given 05/11/22 1953)    ED Course/ Medical Decision Making/ A&P Clinical Course as of 05/11/22 2114  Thu May 11, 2022  2055 CBC with Differential(!) CBC normal [JK]  2055 Comprehensive metabolic panel(!) Metabolic panel does show increased BUN/creatinine, this is similar to previous values [JK]  2055 Head CT hip lumbar spine and chest x-ray without acute abnormalities [JK]    Clinical Course User Index [JK] Linwood Dibbles, MD                           Medical Decision Making Problems Addressed: Dehydration: acute illness or injury Strain of lumbar region, initial encounter: acute illness or injury  Amount and/or Complexity of Data Reviewed Labs: ordered. Decision-making details documented in ED Course. Radiology: ordered and independent interpretation performed.  Risk Prescription drug management.   Patient's prior records reviewed.  Appears to have a history of mental health problems.  Also has history of cocaine  use.  Patient initially stated he was hearing voices to triage staff.  Denies that to me.  Patient does not appear to be responding to internal stimuli on exam.  Patient denies having any acute depression or suicidal ideation.  He is not interested in seeing a mental health provider.  I do suspect the patient does have some underlying mental health issues but right now there is no criteria for involuntary commitment.  Patient has been cooperative.  X-rays do not show any acute findings.  Laboratory tests are unremarkable.  Recommend outpatient follow-up with primary care doctor possibly mental health provider if patient is amenable to this.  Evaluation and diagnostic testing in the emergency department does not suggest an emergent condition requiring admission or immediate intervention beyond what has been performed at this time.  The patient is safe for discharge and has been instructed to return immediately for worsening symptoms, change in symptoms or any other concerns.        Final Clinical Impression(s) / ED Diagnoses Final diagnoses:  Dehydration  Strain of lumbar region, initial encounter    Rx / DC Orders ED Discharge Orders          Ordered    naproxen (NAPROSYN) 375 MG tablet  2 times daily        05/11/22 2111              Linwood Dibbles, MD 05/11/22 2114

## 2022-05-11 NOTE — ED Notes (Signed)
Patient transported to X-ray 

## 2022-05-11 NOTE — Discharge Instructions (Addendum)
Make sure to stay well-hydrated.  Follow-up with a primary care doctor.

## 2022-05-11 NOTE — ED Notes (Signed)
Patient transported to CT 

## 2022-05-11 NOTE — ED Notes (Signed)
Patient heard calling for nurse from room, RN entered room and asked patient what he needed. Patient responded with tangential response regarding increase in gun violence, not seeing family, recently returning to town and eventually requested a sandwich and some chips.

## 2022-05-11 NOTE — ED Notes (Signed)
Pt refused EKG.

## 2022-05-11 NOTE — ED Triage Notes (Signed)
Pt says he is here to check his height, weight, and get some water. He denies hallucinations, si or hi. He is cooperative, denies drug or etoh use.

## 2022-05-11 NOTE — ED Triage Notes (Signed)
Pt BIB [facility] d/t a syncopal event. A/Ox2, EMS reports confusion & easily irritated. Would not allow them to put any "electricity on me because I am african american" so no 12L was done & they were able to get a manual BP & it was 114/70 & CBG 100. Does have Hx of substance use & reports he is in the "starvation stage" from no food in 3 days.

## 2022-05-11 NOTE — ED Provider Triage Note (Signed)
Emergency Medicine Provider Triage Evaluation Note  [facility] , a 41 y.o. male  was evaluated in triage.  Pt complains of syncopal event.  Most of history obtained via EMS given tangential nature of patient's history.  Patient reports a syncopal episode earlier today when standing up from a seated position.  He states that he has not eaten or drank anything in the past 2 days.  Reports cocaine use 3 days ago.  States he is unaware of whether or not he did decide.  Repeatedly states that he is hearing voices during exam that are not there.  Denies fever, chills, night sweats, chest pain, shortness of breath, abdominal pain, nausea, vomiting, urinary symptoms, change in bowel habits.  Patient refusing EKG because he is "Asian American."  Review of Systems  Positive:  Negative: See above  Physical Exam  BP 127/83   Pulse 90   Temp 99.5 F (37.5 C)   Resp 16   SpO2 100%  Gen:   Awake, no distress   Resp:  Normal effort  MSK:   Moves extremities without difficulty  Other:    Medical Decision Making  Medically screening exam initiated at 4:58 PM.  Appropriate orders placed.  [facility] was informed that the remainder of the evaluation will be completed by another provider, this initial triage assessment does not replace that evaluation, and the importance of remaining in the ED until their evaluation is complete.     [facility], [place] 05/11/22 1700

## 2022-05-12 ENCOUNTER — Emergency Department (HOSPITAL_COMMUNITY): Payer: Self-pay

## 2022-05-12 ENCOUNTER — Encounter (HOSPITAL_COMMUNITY): Payer: Self-pay

## 2022-05-12 ENCOUNTER — Emergency Department (HOSPITAL_COMMUNITY): Admission: EM | Admit: 2022-05-12 | Discharge: 2022-05-12 | Discharge status: Against Medical Advice | Payer: Self-pay

## 2022-05-12 ENCOUNTER — Other Ambulatory Visit: Payer: Self-pay

## 2022-05-12 ENCOUNTER — Emergency Department (HOSPITAL_COMMUNITY)
Admission: EM | Admit: 2022-05-12 | Discharge: 2022-05-12 | Discharge status: Against Medical Advice | Payer: Self-pay | Attending: Emergency Medicine | Admitting: Emergency Medicine

## 2022-05-12 DIAGNOSIS — Z5321 Procedure and treatment not carried out due to patient leaving prior to being seen by health care provider: Secondary | ICD-10-CM | POA: Insufficient documentation

## 2022-05-12 DIAGNOSIS — W19XXXA Unspecified fall, initial encounter: Secondary | ICD-10-CM | POA: Insufficient documentation

## 2022-05-12 DIAGNOSIS — M79652 Pain in left thigh: Secondary | ICD-10-CM | POA: Insufficient documentation

## 2022-05-12 NOTE — ED Notes (Signed)
Pt not answering to be roomed .  

## 2022-05-12 NOTE — ED Provider Triage Note (Signed)
Emergency Medicine Provider Triage Evaluation Note  Franko Hilliker , a 41 y.o. male  was evaluated in triage.  Pt complains of right thigh pain after fall yesterday.  Denies head injury or LOC.  States leg hurts to move or walk.  Denies numbness/tingling.  Review of Systems  Positive: Leg pain Negative: fever  Physical Exam  BP (!) 139/97 (BP Location: Right Arm)   Pulse 78   Temp 98.6 F (37 C) (Oral)   Resp 18   Ht 6\' 2"  (1.88 m)   Wt 83.9 kg   SpO2 100%   BMI 23.75 kg/m   Gen:   Awake, no distress   Resp:  Normal effort  MSK:   Moves extremities without difficulty  Other:  Leg without acute deformity or shortening, remains ambulatory in triage  Medical Decision Making  Medically screening exam initiated at 5:52 AM.  Appropriate orders placed.  Jyron Turman was informed that the remainder of the evaluation will be completed by another provider, this initial triage assessment does not replace that evaluation, and the importance of remaining in the ED until their evaluation is complete.  Leg pain s/p fall.  No deformity on exam, remains ambulatory.  X-ray ordered.   Ezzard Standing, PA-C 05/12/22 [phone]

## 2022-05-12 NOTE — ED Triage Notes (Signed)
Pt arrived POV from home c/o right leg/thigh pain that started yesterday after he lost his footing and fell. Pt states it is hard to walk. Pt ambulated into triage without any difficulty.

## 2022-05-13 ENCOUNTER — Other Ambulatory Visit: Payer: Self-pay

## 2022-05-13 ENCOUNTER — Emergency Department (HOSPITAL_COMMUNITY)
Admission: EM | Admit: 2022-05-13 | Discharge: 2022-05-13 | Disposition: A | Discharge status: Home / Self Care | Payer: Self-pay | Attending: Emergency Medicine | Admitting: Emergency Medicine

## 2022-05-13 ENCOUNTER — Encounter (HOSPITAL_COMMUNITY): Payer: Self-pay

## 2022-05-13 DIAGNOSIS — Z760 Encounter for issue of repeat prescription: Secondary | ICD-10-CM | POA: Insufficient documentation

## 2022-05-13 DIAGNOSIS — I10 Essential (primary) hypertension: Secondary | ICD-10-CM | POA: Insufficient documentation

## 2022-05-13 DIAGNOSIS — R109 Unspecified abdominal pain: Secondary | ICD-10-CM | POA: Insufficient documentation

## 2022-05-13 DIAGNOSIS — F909 Attention-deficit hyperactivity disorder, unspecified type: Secondary | ICD-10-CM | POA: Insufficient documentation

## 2022-05-13 DIAGNOSIS — M79604 Pain in right leg: Secondary | ICD-10-CM | POA: Insufficient documentation

## 2022-05-13 DIAGNOSIS — Z139 Encounter for screening, unspecified: Secondary | ICD-10-CM | POA: Insufficient documentation

## 2022-05-13 DIAGNOSIS — Z5900 Homelessness unspecified: Secondary | ICD-10-CM | POA: Insufficient documentation

## 2022-05-13 MED ORDER — IBUPROFEN 800 MG PO TABS
800.0000 mg | ORAL_TABLET | Freq: Once | ORAL | Status: AC
Start: 2022-05-13 — End: 2022-05-13
  Administered 2022-05-13: 800 mg via ORAL
  Filled 2022-05-13: qty 1

## 2022-05-13 NOTE — Discharge Instructions (Signed)
Please see resources provided at previous discharge for social work assistance

## 2022-05-13 NOTE — ED Provider Triage Note (Signed)
Emergency Medicine Provider Triage Evaluation Note  [facility] , a 41 y.o. male  was evaluated in triage.  Pt complains of wanting his clothes to be cleaned. Has been evaluated for leg pain and have BM after [employer]. Repeatedly states he is here for clean clothes. Was offered clean scrubs after discharge and declined them.  Review of Systems  Positive: Leg pain Negative: Chest pain, SOB  Physical Exam  BP (!) 154/88 (BP Location: Right Arm)   Pulse 78   Temp 98.2 F (36.8 C) (Oral)   Resp 18   SpO2 99%  Gen:   Awake, no distress   Resp:  Normal effort  MSK:   Moves extremities without difficulty   Medical Decision Making  Medically screening exam initiated at 8:33 PM.  Patient to be discharged.    Pete Pelt, [place] 05/13/22 2035

## 2022-05-13 NOTE — ED Provider Notes (Signed)
[facility] DEPT Provider Note   CSN: [id number] Arrival date & time: 05/13/22  1016     History  Chief Complaint  Patient presents with   Leg Pain    [facility] is a 41 y.o. male.  Patient presents to the hospital with a chief complaint of homelessness.  He states he is here because he has no place to stay and needs a place to have his lunch.  He does complain of right-sided leg pain but states that he does not think he needs evaluation at this time.  He requested an ibuprofen to help with the pain.  Past medical history significant for schizophrenia and hypertension  HPI     Home Medications Prior to Admission medications   Medication Sig Start Date End Date Taking? Authorizing Provider  benztropine (COGENTIN) 2 MG tablet Take 1 tablet (2 mg total) by mouth 2 (two) times daily. 07/15/21 08/14/21  Long, Wonda Olds, MD  hydrOXYzine (ATARAX) 25 MG tablet Take 1-2 tablets (25-50 mg total) by mouth See admin instructions. Take 25 mg by mouth in the morning and 50 mg at bedtime 07/15/21   Long, Wonda Olds, MD  metoprolol tartrate (LOPRESSOR) 25 MG tablet Take 1 tablet (25 mg total) by mouth in the morning. 07/15/21 08/14/21  Long, Wonda Olds, MD  naproxen (NAPROSYN) 375 MG tablet Take 1 tablet (375 mg total) by mouth 2 (two) times daily. 05/11/22   Dorie Rank, MD      Allergies    Patient has no known allergies.    Review of Systems   Review of Systems  Musculoskeletal:  Positive for myalgias.    Physical Exam Updated Vital Signs BP (!) 135/102 (BP Location: Right Arm)   Pulse 80   Temp 98.5 F (36.9 C) (Oral)   Resp 18   SpO2 100%  Physical Exam Vitals and nursing note reviewed.  HENT:     Head: Normocephalic and atraumatic.  Eyes:     Pupils: Pupils are equal, round, and reactive to light.  Cardiovascular:     Rate and Rhythm: Normal rate.  Pulmonary:     Effort: Pulmonary effort is normal. No respiratory distress.  Abdominal:      Palpations: Abdomen is soft.  Musculoskeletal:        General: No signs of injury.     Cervical back: Normal range of motion.  Skin:    General: Skin is dry.  Neurological:     Mental Status: He is alert.  Psychiatric:        Speech: Speech normal.        Behavior: Behavior normal.     ED Results / Procedures / Treatments   Labs (all labs ordered are listed, but only abnormal results are displayed) Labs Reviewed - No data to display  EKG None  Radiology DG Femur Min 2 Views Right  Result Date: 05/12/2022 CLINICAL DATA:  Fall.  Lateral right thigh pain after fall. EXAM: RIGHT FEMUR 2 VIEWS COMPARISON:  None Available. FINDINGS: There is no evidence of fracture or other focal bone lesions. Soft tissues are unremarkable. IMPRESSION: Negative two view femur radiographs. Electronically Signed   By: San Morelle M.D.   On: 05/12/2022 06:21   CT Head Wo Contrast  Result Date: 05/11/2022 CLINICAL DATA:  Mental status change EXAM: CT HEAD WITHOUT CONTRAST TECHNIQUE: Contiguous axial images were obtained from the base of the skull through the vertex without intravenous contrast. RADIATION DOSE REDUCTION: This exam was  performed according to the departmental dose-optimization program which includes automated exposure control, adjustment of the mA and/or kV according to patient size and/or use of iterative reconstruction technique. COMPARISON:  09/25/2006 FINDINGS: Brain: No evidence of acute infarction, hemorrhage, mass, mass effect, or midline shift. No hydrocephalus or extra-axial fluid collection. Vascular: No hyperdense vessel. Skull: Normal. Negative for fracture or focal lesion. Sinuses/Orbits: Mucosal thickening in the left-greater-than-right maxillary sinus, ethmoid air cells, and right frontal sinus. The orbits are unremarkable. Other: The mastoid air cells are well aerated. IMPRESSION: No acute intracranial process. Electronically Signed   By: Wiliam Ke M.D.   On: 05/11/2022  20:41    Procedures Procedures    Medications Ordered in ED Medications  ibuprofen (ADVIL) tablet 800 mg (800 mg Oral Given 05/13/22 1933)    ED Course/ Medical Decision Making/ A&P                           Medical Decision Making Risk Prescription drug management.   Patient presents to the hospital with chief concerns about homelessness.  Patient request placed to do laundry and request pressure close.  I explained that we only have paper scrubs for him to be able to change into at this time.  Patient declines these at this time.  He does complain of right-sided flank pain due to a previous trauma [place] of state.  I offered repeat imaging and evaluation and the patient declined.  He requested an ibuprofen which was provided.  Upon reassessment he was feeling somewhat better.  Being that this is the weekend social work is not available for in person consult.  Plan to discharge patient with resource packet for homeless shelters in the area.  Discharge        Final Clinical Impression(s) / ED Diagnoses Final diagnoses:  Right leg pain  Homeless    Rx / DC Orders ED Discharge Orders     None         Pamala Duffel 05/13/22 1934    Rolan Bucco, MD 05/13/22 2147

## 2022-05-13 NOTE — Discharge Instructions (Signed)
You were seen today due to right sided leg pain. Please take ibuprofen as needed. I have attached packets of information in regards to social services and homeless shelters in the area. Please reach out to these resources

## 2022-05-13 NOTE — ED Notes (Signed)
PT  ambulatory to hallway D, pt reports leg pain, pt states that he is homeless, resps even and unlabored.

## 2022-05-13 NOTE — ED Triage Notes (Signed)
Patient BIB [facility]. He stated his right leg is hurting. He said he was not receiving proper medical treatment from up [place]. Patient ambulatory in triage.

## 2022-05-13 NOTE — ED Triage Notes (Signed)
Pt complaining of having loose stools today after being released from [facility]. Wants refill for adhd medication.

## 2022-05-13 NOTE — ED Provider Notes (Signed)
[facility]-EMERGENCY DEPT Provider Note   CSN: [id number] Arrival date & time: 05/13/22  1952     History  Chief Complaint  Patient presents with   Abdominal Pain    [facility] is a 41 y.o. male here because he wants to have his clothes cleaned. States he was just here and wants his clothes cleaned. He has no complaints other than he wants his ADHD medicine filled.     Home Medications Prior to Admission medications   Medication Sig Start Date End Date Taking? Authorizing Provider  benztropine (COGENTIN) 2 MG tablet Take 1 tablet (2 mg total) by mouth 2 (two) times daily. 07/15/21 08/14/21  Long, Arlyss Repress, MD  hydrOXYzine (ATARAX) 25 MG tablet Take 1-2 tablets (25-50 mg total) by mouth See admin instructions. Take 25 mg by mouth in the morning and 50 mg at bedtime 07/15/21   Long, Arlyss Repress, MD  metoprolol tartrate (LOPRESSOR) 25 MG tablet Take 1 tablet (25 mg total) by mouth in the morning. 07/15/21 08/14/21  Long, Arlyss Repress, MD  naproxen (NAPROSYN) 375 MG tablet Take 1 tablet (375 mg total) by mouth 2 (two) times daily. 05/11/22   Linwood Dibbles, MD      Allergies    Patient has no known allergies.    Review of Systems   Review of Systems  Respiratory:  Negative for shortness of breath.   Gastrointestinal:  Negative for abdominal pain.  Psychiatric/Behavioral:  Negative for hallucinations.     Physical Exam Updated Vital Signs BP (!) 154/88 (BP Location: Right Arm)   Pulse 78   Temp 98.2 F (36.8 C) (Oral)   Resp 18   SpO2 99%  Physical Exam Vitals and nursing note reviewed.  Constitutional:      General: He is not in acute distress.    Appearance: He is well-developed.  HENT:     Head: Normocephalic and atraumatic.  Eyes:     Conjunctiva/sclera: Conjunctivae normal.  Cardiovascular:     Rate and Rhythm: Normal rate and regular rhythm.     Heart sounds: No murmur heard. Pulmonary:     Effort: Pulmonary effort is normal. No respiratory  distress.     Breath sounds: Normal breath sounds.  Abdominal:     Palpations: Abdomen is soft.     Tenderness: There is no abdominal tenderness.  Musculoskeletal:        General: No swelling.     Cervical back: Neck supple.  Skin:    General: Skin is warm and dry.     Capillary Refill: Capillary refill takes less than 2 seconds.  Neurological:     Mental Status: He is alert.  Psychiatric:        Mood and Affect: Mood normal.     ED Results / Procedures / Treatments   Labs (all labs ordered are listed, but only abnormal results are displayed) Labs Reviewed - No data to display  EKG None  Radiology DG Femur Min 2 Views Right  Result Date: 05/12/2022 CLINICAL DATA:  Fall.  Lateral right thigh pain after fall. EXAM: RIGHT FEMUR 2 VIEWS COMPARISON:  None Available. FINDINGS: There is no evidence of fracture or other focal bone lesions. Soft tissues are unremarkable. IMPRESSION: Negative two view femur radiographs. Electronically Signed   By: Marin Roberts M.D.   On: 05/12/2022 06:21    Procedures Procedures    Medications Ordered in ED Medications - No data to display  ED Course/ Medical Decision Making/  A&P                           Medical Decision Making Patient is a 41 y.o. male requesting for his clothes to be cleaned and ADHD medicine to be filled. He was just evaluated in the ED 2 hours prior. He has no medical complaints. I informed him that ADHD prescriptions are not filled. He was provided information on social services and discharged. He had no medical concerns.   Final Clinical Impression(s) / ED Diagnoses Final diagnoses:  Encounter for medical screening examination    Rx / DC Orders ED Discharge Orders     None         Pablo Mathurin, [place], PA 05/13/22 2041    Charlynne Pander, MD 05/13/22 2238

## 2022-05-16 ENCOUNTER — Other Ambulatory Visit: Payer: Self-pay

## 2022-05-16 ENCOUNTER — Encounter (HOSPITAL_COMMUNITY): Payer: Self-pay | Admitting: Emergency Medicine

## 2022-05-16 ENCOUNTER — Emergency Department (HOSPITAL_COMMUNITY)
Admission: EM | Admit: 2022-05-16 | Discharge: 2022-05-17 | Disposition: A | Discharge status: Home / Self Care | Payer: Self-pay | Attending: Emergency Medicine | Admitting: Emergency Medicine

## 2022-05-16 DIAGNOSIS — Z5900 Homelessness unspecified: Secondary | ICD-10-CM | POA: Insufficient documentation

## 2022-05-16 DIAGNOSIS — G4700 Insomnia, unspecified: Secondary | ICD-10-CM | POA: Insufficient documentation

## 2022-05-16 NOTE — ED Triage Notes (Signed)
Pt states that he just wants his BP checked and that he can't sleep because "its hard to sleep on the streets."

## 2022-05-17 ENCOUNTER — Encounter (HOSPITAL_COMMUNITY): Payer: Self-pay | Admitting: Emergency Medicine

## 2022-05-17 ENCOUNTER — Emergency Department (HOSPITAL_COMMUNITY)
Admission: EM | Admit: 2022-05-17 | Discharge: 2022-05-18 | Disposition: A | Discharge status: Home / Self Care | Payer: Self-pay | Attending: Emergency Medicine | Admitting: Emergency Medicine

## 2022-05-17 ENCOUNTER — Other Ambulatory Visit: Payer: Self-pay

## 2022-05-17 DIAGNOSIS — R799 Abnormal finding of blood chemistry, unspecified: Secondary | ICD-10-CM | POA: Insufficient documentation

## 2022-05-17 DIAGNOSIS — Z5900 Homelessness unspecified: Secondary | ICD-10-CM | POA: Insufficient documentation

## 2022-05-17 LAB — CBG MONITORING, ED: Glucose-Capillary: 76 mg/dL (ref 70–99)

## 2022-05-17 NOTE — ED Notes (Signed)
Called patient back to the triage cubby to recheck vitals and so Lawsing, EDP can see him to possibly discharge. Patient irritated with staff. Refusing to answer any questions asked about pain or anything else. Upset with his BP being checked. Called this nurse a cunt.

## 2022-05-17 NOTE — ED Triage Notes (Signed)
Pt states he wants his cbg checked.

## 2022-05-17 NOTE — ED Provider Notes (Signed)
[facility]-EMERGENCY DEPT Provider Note   CSN: [id number] Arrival date & time: 05/16/22  2121     History  Chief Complaint  Patient presents with   Insomnia    Aaron Rubio is a 41 y.o. male.   Insomnia     41 year old male with a hx of homelessness who presents to the ED with a complaint of homelessness. He does not have a PCP. He is requesting a bed. He is being evaluated in the lobby due to significant ED boarding at this time. He had initially requested a check of his BP. Denies any blurry vision, chest pain, abdominal pain, or any other complaints. Not fully cooperative with interview, no mild agitation due to waiting overnight. Denies any other complaints. Requesting assistance with transportation.  Home Medications Prior to Admission medications   Medication Sig Start Date End Date Taking? Authorizing Provider  benztropine (COGENTIN) 2 MG tablet Take 1 tablet (2 mg total) by mouth 2 (two) times daily. 07/15/21 08/14/21  Long, Arlyss Repress, MD  hydrOXYzine (ATARAX) 25 MG tablet Take 1-2 tablets (25-50 mg total) by mouth See admin instructions. Take 25 mg by mouth in the morning and 50 mg at bedtime 07/15/21   Long, Arlyss Repress, MD  metoprolol tartrate (LOPRESSOR) 25 MG tablet Take 1 tablet (25 mg total) by mouth in the morning. 07/15/21 08/14/21  Long, Arlyss Repress, MD  naproxen (NAPROSYN) 375 MG tablet Take 1 tablet (375 mg total) by mouth 2 (two) times daily. 05/11/22   Linwood Dibbles, MD      Allergies    Patient has no known allergies.    Review of Systems   Review of Systems  Psychiatric/Behavioral:  The patient has insomnia.   All other systems reviewed and are negative.   Physical Exam Updated Vital Signs BP 139/83 (BP Location: Right Arm)   Pulse 74   Temp 99.1 F (37.3 C) (Oral)   Resp 16   Ht 6\' 2"  (1.88 m)   Wt 83.9 kg   SpO2 100%   BMI 23.75 kg/m  Physical Exam Vitals and nursing note reviewed.  Constitutional:      General: He is not  in acute distress. HENT:     Head: Normocephalic and atraumatic.  Eyes:     Conjunctiva/sclera: Conjunctivae normal.     Pupils: Pupils are equal, round, and reactive to light.  Cardiovascular:     Rate and Rhythm: Normal rate and regular rhythm.  Pulmonary:     Effort: Pulmonary effort is normal. No respiratory distress.     Breath sounds: Normal breath sounds.  Abdominal:     General: There is no distension.     Tenderness: There is no guarding.  Musculoskeletal:        General: No deformity or signs of injury.     Cervical back: Neck supple.  Skin:    Findings: No lesion or rash.  Neurological:     General: No focal deficit present.     Mental Status: He is alert. Mental status is at baseline.  Psychiatric:        Mood and Affect: Mood normal.        Behavior: Behavior normal.        Thought Content: Thought content normal.     ED Results / Procedures / Treatments   Labs (all labs ordered are listed, but only abnormal results are displayed) Labs Reviewed - No data to display  EKG None  Radiology No  results found.  Procedures Procedures    Medications Ordered in ED Medications - No data to display  ED Course/ Medical Decision Making/ A&P                           Medical Decision Making    41 year old male with a hx of homelessness who presents to the ED with a complaint of homelessness. He does not have a PCP. He is requesting a bed. He is being evaluated in the lobby due to significant ED boarding at this time. He had initially requested a check of his BP. Denies any blurry vision, chest pain, abdominal pain, or any other complaints. Not fully cooperative with interview, no mild agitation due to waiting overnight. Denies any other complaints. Requesting assistance with transportation.  Homeless. Resources provided.  The patient has been appropriately medically screened and/or stabilized in the ED. I have low suspicion for any other emergent medical  condition which would require further screening, evaluation or treatment in the ED or require inpatient management.    Final Clinical Impression(s) / ED Diagnoses Final diagnoses:  Homelessness    Rx / DC Orders ED Discharge Orders     None         Ernie Avena, MD 05/17/22 [phone]

## 2022-05-18 ENCOUNTER — Encounter (HOSPITAL_COMMUNITY): Payer: Self-pay | Admitting: Emergency Medicine

## 2022-05-18 ENCOUNTER — Emergency Department (HOSPITAL_COMMUNITY)
Admission: EM | Admit: 2022-05-18 | Discharge: 2022-05-18 | Disposition: A | Discharge status: Home / Self Care | Payer: Self-pay | Attending: Emergency Medicine | Admitting: Emergency Medicine

## 2022-05-18 DIAGNOSIS — Z5900 Homelessness unspecified: Secondary | ICD-10-CM | POA: Insufficient documentation

## 2022-05-18 DIAGNOSIS — M79672 Pain in left foot: Secondary | ICD-10-CM | POA: Insufficient documentation

## 2022-05-18 DIAGNOSIS — Z765 Malingerer [conscious simulation]: Secondary | ICD-10-CM | POA: Insufficient documentation

## 2022-05-18 DIAGNOSIS — R682 Dry mouth, unspecified: Secondary | ICD-10-CM | POA: Insufficient documentation

## 2022-05-18 DIAGNOSIS — M79671 Pain in right foot: Secondary | ICD-10-CM | POA: Insufficient documentation

## 2022-05-18 MED ORDER — ACETAMINOPHEN 325 MG PO TABS
650.0000 mg | ORAL_TABLET | Freq: Once | ORAL | Status: AC
  Administered 2022-05-18: 650 mg via ORAL
  Filled 2022-05-18: qty 2

## 2022-05-18 NOTE — ED Notes (Signed)
Patient given a sandwich and meds. JRPRN

## 2022-05-18 NOTE — ED Triage Notes (Signed)
  Patient comes in with dry mouth and foot pain.  Patient states he has been walking around a lot and has bilateral foot pain.  Patient also states he hasn't had much to eat.  Patient is homeless.  Pain 6/10, aching in bilateral feet.  Denies any SI/HI. Denies ETOH/drug use.

## 2022-05-18 NOTE — Discharge Instructions (Signed)
I have given resources for places for you to go to help with your situation.  Return if development of any new or worsening symptoms.

## 2022-05-18 NOTE — ED Provider Notes (Signed)
[facility]-EMERGENCY DEPT Provider Note   CSN: [id number] Arrival date & time: 05/17/22  2349     History  Chief Complaint  Patient presents with   Wants CBG checked    Aaron Rubio is a 41 y.o. male.  Patient with no pertinent past medical history presents today initially requesting his blood sugar checked.  He denies any history of diabetes and denies any including polydipsia polyuria or abdominal pain.  When asked further, patient states that he is homeless and just needs a place to stay tonight.  Denies any other plans.  Denies SI/HI or auditory/visual hallucinations. Of note, patient was just here yesterday with similar requests.  The history is provided by the patient. No language interpreter was used.       Home Medications Prior to Admission medications   Medication Sig Start Date End Date Taking? Authorizing Provider  benztropine (COGENTIN) 2 MG tablet Take 1 tablet (2 mg total) by mouth 2 (two) times daily. 07/15/21 08/14/21  Long, Arlyss Repress, MD  hydrOXYzine (ATARAX) 25 MG tablet Take 1-2 tablets (25-50 mg total) by mouth See admin instructions. Take 25 mg by mouth in the morning and 50 mg at bedtime 07/15/21   Long, Arlyss Repress, MD  metoprolol tartrate (LOPRESSOR) 25 MG tablet Take 1 tablet (25 mg total) by mouth in the morning. 07/15/21 08/14/21  Long, Arlyss Repress, MD  naproxen (NAPROSYN) 375 MG tablet Take 1 tablet (375 mg total) by mouth 2 (two) times daily. 05/11/22   Linwood Dibbles, MD      Allergies    Patient has no known allergies.    Review of Systems   Review of Systems  All other systems reviewed and are negative.   Physical Exam Updated Vital Signs BP (!) 157/109   Pulse 73   Temp 98.9 F (37.2 C) (Oral)   Resp 16   SpO2 100%  Physical Exam Vitals and nursing note reviewed.  Constitutional:      General: He is not in acute distress.    Appearance: Normal appearance. He is normal weight. He is not ill-appearing, toxic-appearing or  diaphoretic.  HENT:     Head: Normocephalic and atraumatic.  Cardiovascular:     Rate and Rhythm: Normal rate.  Pulmonary:     Effort: Pulmonary effort is normal. No respiratory distress.  Musculoskeletal:        General: Normal range of motion.     Cervical back: Normal range of motion.  Skin:    General: Skin is warm and dry.  Neurological:     General: No focal deficit present.     Mental Status: He is alert.  Psychiatric:        Mood and Affect: Mood normal.        Behavior: Behavior normal.     ED Results / Procedures / Treatments   Labs (all labs ordered are listed, but only abnormal results are displayed) Labs Reviewed  CBG MONITORING, ED    EKG None  Radiology No results found.  Procedures Procedures    Medications Ordered in ED Medications - No data to display  ED Course/ Medical Decision Making/ A&P                           Medical Decision Making  Patient presents today requesting a CBG check.  Same is 77.  He denies history of diabetes or other complaints.  States that he just needs  a place to stay tonight given that he is homeless.  He is afebrile, nontoxic-appearing, and in no acute distress with reassuring vital signs.  Patient was just seen last night with similar symptoms.  Last laboratory evaluation was on 11/9 and was unremarkable for acute findings.  Patient is stable to be discharged at this time.  Will provide resources for shelters.  Patient is understanding and amenable with plan, educated on red flag symptoms of prompt return.  Patient discharged in stable condition.  Final Clinical Impression(s) / ED Diagnoses Final diagnoses:  Homelessness    Rx / DC Orders ED Discharge Orders     None     An After Visit Summary was printed and given to the patient.     Vear Clock 05/18/22 0006    Palumbo, April, MD 05/18/22 [phone]

## 2022-05-18 NOTE — ED Provider Notes (Signed)
[facility]-EMERGENCY DEPT Provider Note   CSN: [id number] Arrival date & time: 05/18/22  2037     History  Chief Complaint  Patient presents with   Dry mouth   Foot Pain    Aaron Rubio is a 41 y.o. male with past medical history significant for schizophrenia, homelessness, frequent emergency department visits who presents with concern for pain in bilateral feet, dry mouth, and needing a meal.  He does not have any acute complaints today.  He was discharged earlier this morning from the emergency department.   Foot Pain       Home Medications Prior to Admission medications   Medication Sig Start Date End Date Taking? Authorizing Provider  benztropine (COGENTIN) 2 MG tablet Take 1 tablet (2 mg total) by mouth 2 (two) times daily. 07/15/21 08/14/21  Long, Arlyss Repress, MD  hydrOXYzine (ATARAX) 25 MG tablet Take 1-2 tablets (25-50 mg total) by mouth See admin instructions. Take 25 mg by mouth in the morning and 50 mg at bedtime 07/15/21   Long, Arlyss Repress, MD  metoprolol tartrate (LOPRESSOR) 25 MG tablet Take 1 tablet (25 mg total) by mouth in the morning. 07/15/21 08/14/21  Long, Arlyss Repress, MD  naproxen (NAPROSYN) 375 MG tablet Take 1 tablet (375 mg total) by mouth 2 (two) times daily. 05/11/22   Linwood Dibbles, MD      Allergies    Patient has no known allergies.    Review of Systems   Review of Systems  All other systems reviewed and are negative.   Physical Exam Updated Vital Signs BP (!) 134/91 (BP Location: Right Arm)   Pulse 92   Temp 98.3 F (36.8 C) (Oral)   Resp 18   Ht 6\' 2"  (1.88 m)   Wt 83.9 kg   SpO2 100%   BMI 23.75 kg/m  Physical Exam Vitals and nursing note reviewed.  Constitutional:      General: He is not in acute distress.    Appearance: Normal appearance.  HENT:     Head: Normocephalic and atraumatic.  Eyes:     General:        Right eye: No discharge.        Left eye: No discharge.  Cardiovascular:     Rate and Rhythm:  Normal rate and regular rhythm.  Pulmonary:     Effort: Pulmonary effort is normal. No respiratory distress.  Musculoskeletal:        General: No deformity.     Comments: Normal appearance of bilateral feet  Skin:    General: Skin is warm and dry.     Comments: No evidence of open wounds, cellulitis  Neurological:     Mental Status: He is alert and oriented to person, place, and time.  Psychiatric:        Mood and Affect: Mood normal.        Behavior: Behavior normal.     ED Results / Procedures / Treatments   Labs (all labs ordered are listed, but only abnormal results are displayed) Labs Reviewed - No data to display  EKG None  Radiology No results found.  Procedures Procedures    Medications Ordered in ED Medications  acetaminophen (TYLENOL) tablet 650 mg (650 mg Oral Given 05/18/22 2131)    ED Course/ Medical Decision Making/ A&P                           Medical Decision Making  Risk OTC drugs.   This is a patient who is well-known to this emergency department with frequent visits for chronic foot pain, and other chronic conditions, normally wanting food, drink, and a bed for a few minutes.  When attempting to speak to the patient he reports some pain in feet, dry mouth.  He has been given some oral fluids, he is provided a sandwich.  His feet appear normal on my exam, with no evidence of cellulitis, open wounds, or other abnormalities.  Some of his answers are somewhat inappropriate but he does not appear in acute psychosis at this time.  He denies SI, HI, AVH.  Denies alcohol or drug use.  We will discharge in stable condition. Final Clinical Impression(s) / ED Diagnoses Final diagnoses:  Malingering  Pain in both feet  Dry mouth    Rx / DC Orders ED Discharge Orders     None         Olene Floss, PA-C 05/18/22 2139    Virgina Norfolk, DO 05/18/22 2206

## 2022-05-30 ENCOUNTER — Other Ambulatory Visit: Payer: Self-pay

## 2022-05-30 ENCOUNTER — Emergency Department (HOSPITAL_COMMUNITY)
Admission: EM | Admit: 2022-05-30 | Discharge: 2022-05-30 | Discharge status: Against Medical Advice | Payer: Self-pay | Attending: Emergency Medicine | Admitting: Emergency Medicine

## 2022-05-30 DIAGNOSIS — Z5900 Homelessness unspecified: Secondary | ICD-10-CM | POA: Insufficient documentation

## 2022-05-30 DIAGNOSIS — Z5321 Procedure and treatment not carried out due to patient leaving prior to being seen by health care provider: Secondary | ICD-10-CM | POA: Insufficient documentation

## 2022-05-30 DIAGNOSIS — R6883 Chills (without fever): Secondary | ICD-10-CM | POA: Insufficient documentation

## 2022-05-30 DIAGNOSIS — M791 Myalgia, unspecified site: Secondary | ICD-10-CM | POA: Insufficient documentation

## 2022-05-30 NOTE — ED Triage Notes (Signed)
Patient arrived with EMS from street reports pain at both arms and legs this evening due to cold exposure / ambulatory ( homeless) . Denies injury.

## 2022-05-30 NOTE — ED Provider Triage Note (Signed)
Emergency Medicine Provider Triage Evaluation Note  Jermal Dismuke , a 41 y.o. male  was evaluated in triage.  Pt complains of generalized body pain, chills after prolonged cold exposure due to homelessness tonight.  Review of Systems  Positive: As above Negative: chest pain syncope palpitations  Physical Exam  BP 130/86 (BP Location: Right Arm)   Pulse 89   Resp 18   SpO2 100%  Gen:   Awake, no distress   Resp:  Normal effort  MSK:   Moves extremities without difficulty  Other:  RRR no setters G.  Lungs CTA B.  Medical Decision Making  Medically screening exam initiated at 4:21 AM.  Appropriate orders placed.  Trason Shifflet was informed that the remainder of the evaluation will be completed by another provider, this initial triage assessment does not replace that evaluation, and the importance of remaining in the ED until their evaluation is complete.  Temp 98.2 F at time of my evaluation, orally.  Patient without evidence of hypothermia, and well-appearing at this time.  Will provide more warm blankets in the ED, patient to be reevaluated later this morning. This chart was dictated using voice recognition software, Dragon. Despite the best efforts of this provider to proofread and correct errors, errors may still occur which can change documentation meaning.    Paris Lore, PA-C 05/30/22 0423

## 2022-05-30 NOTE — ED Notes (Signed)
Pt cannot be found

## 2022-05-31 ENCOUNTER — Encounter (HOSPITAL_COMMUNITY): Payer: Self-pay | Admitting: Emergency Medicine

## 2022-05-31 ENCOUNTER — Emergency Department (HOSPITAL_COMMUNITY)
Admission: EM | Admit: 2022-05-31 | Discharge: 2022-05-31 | Disposition: A | Discharge status: Home / Self Care | Payer: Self-pay | Attending: Emergency Medicine | Admitting: Emergency Medicine

## 2022-05-31 ENCOUNTER — Emergency Department (HOSPITAL_COMMUNITY)
Admission: EM | Admit: 2022-05-31 | Discharge: 2022-06-01 | Disposition: A | Discharge status: Home / Self Care | Payer: Medicaid - Dental | Attending: Emergency Medicine | Admitting: Emergency Medicine

## 2022-05-31 ENCOUNTER — Emergency Department (HOSPITAL_COMMUNITY)
Admission: EM | Admit: 2022-05-31 | Discharge: 2022-05-31 | Disposition: A | Discharge status: Home / Self Care | Payer: Medicaid - Dental | Attending: Emergency Medicine | Admitting: Emergency Medicine

## 2022-05-31 ENCOUNTER — Other Ambulatory Visit: Payer: Self-pay

## 2022-05-31 DIAGNOSIS — I10 Essential (primary) hypertension: Secondary | ICD-10-CM | POA: Insufficient documentation

## 2022-05-31 DIAGNOSIS — Z1152 Encounter for screening for COVID-19: Secondary | ICD-10-CM | POA: Insufficient documentation

## 2022-05-31 DIAGNOSIS — R519 Headache, unspecified: Secondary | ICD-10-CM | POA: Insufficient documentation

## 2022-05-31 DIAGNOSIS — Z79899 Other long term (current) drug therapy: Secondary | ICD-10-CM | POA: Insufficient documentation

## 2022-05-31 DIAGNOSIS — Z5900 Homelessness unspecified: Secondary | ICD-10-CM | POA: Insufficient documentation

## 2022-05-31 DIAGNOSIS — J069 Acute upper respiratory infection, unspecified: Secondary | ICD-10-CM | POA: Insufficient documentation

## 2022-05-31 LAB — RESP PANEL BY RT-PCR (FLU A&B, COVID) ARPGX2
Influenza A by PCR: NEGATIVE
Influenza B by PCR: NEGATIVE
SARS Coronavirus 2 by RT PCR: NEGATIVE

## 2022-05-31 MED ORDER — ACETAMINOPHEN 500 MG PO TABS
1000.0000 mg | ORAL_TABLET | Freq: Once | ORAL | Status: DC
  Filled 2022-05-31: qty 2

## 2022-05-31 MED ORDER — ACETAMINOPHEN 325 MG PO TABS
325.0000 mg | ORAL_TABLET | Freq: Once | ORAL | Status: AC
  Administered 2022-05-31: 325 mg via ORAL
  Filled 2022-05-31: qty 1

## 2022-05-31 NOTE — ED Triage Notes (Signed)
PT reports wants head examined for headaches and shoulders and legs to be xrayed. He is stating he has some pulled muscle and charlie horses.

## 2022-05-31 NOTE — Discharge Instructions (Signed)
[facility] is available at local shelters due to low temps outside.

## 2022-05-31 NOTE — ED Provider Notes (Signed)
[facility]-EMERGENCY DEPT Provider Note   CSN: [id number] Arrival date & time: 05/31/22  0830     History  Chief Complaint  Patient presents with   URI    Aaron Rubio is a 41 y.o. male.   URI    41 year old male with past medical history significant for schizophrenia, HTN who presents to the emergency department with a chief complaint of URI symptoms.  The patient states that he has had a mildly productive cough for the last 2 days.  He endorses production of yellow sputum.  He denies any fevers or chills.  He denies any chest pain.  He was notably seen in the emergency department overnight at [facility] seeking food and shelter due to homelessness.  He had no acute complaints at that time.  Home Medications Prior to Admission medications   Medication Sig Start Date End Date Taking? Authorizing Provider  benztropine (COGENTIN) 2 MG tablet Take 1 tablet (2 mg total) by mouth 2 (two) times daily. 07/15/21 08/14/21  Long, Arlyss Repress, MD  hydrOXYzine (ATARAX) 25 MG tablet Take 1-2 tablets (25-50 mg total) by mouth See admin instructions. Take 25 mg by mouth in the morning and 50 mg at bedtime 07/15/21   Long, Arlyss Repress, MD  metoprolol tartrate (LOPRESSOR) 25 MG tablet Take 1 tablet (25 mg total) by mouth in the morning. 07/15/21 08/14/21  Long, Arlyss Repress, MD  naproxen (NAPROSYN) 375 MG tablet Take 1 tablet (375 mg total) by mouth 2 (two) times daily. 05/11/22   Linwood Dibbles, MD      Allergies    Patient has no known allergies.    Review of Systems   Review of Systems  All other systems reviewed and are negative.   Physical Exam Updated Vital Signs BP (!) 156/72   Pulse 66   Temp 98 F (36.7 C) (Oral)   Resp 18   Ht 6\' 2"  (1.88 m)   Wt 83.9 kg   SpO2 100%   BMI 23.75 kg/m  Physical Exam Vitals and nursing note reviewed.  Constitutional:      General: He is not in acute distress. HENT:     Head: Normocephalic and atraumatic.  Eyes:      Conjunctiva/sclera: Conjunctivae normal.     Pupils: Pupils are equal, round, and reactive to light.  Cardiovascular:     Rate and Rhythm: Normal rate and regular rhythm.     Heart sounds: Normal heart sounds.  Pulmonary:     Effort: Pulmonary effort is normal. No respiratory distress.     Breath sounds: Normal breath sounds. No wheezing, rhonchi or rales.  Abdominal:     General: There is no distension.     Tenderness: There is no guarding.  Musculoskeletal:        General: No deformity or signs of injury.     Cervical back: Neck supple.  Skin:    Findings: No lesion or rash.  Neurological:     General: No focal deficit present.     Mental Status: He is alert. Mental status is at baseline.  Psychiatric:        Attention and Perception: Attention and perception normal.        Mood and Affect: Mood and affect normal.        Speech: Speech normal.        Behavior: Behavior normal. Behavior is cooperative.        Thought Content: Thought content normal.  ED Results / Procedures / Treatments   Labs (all labs ordered are listed, but only abnormal results are displayed) Labs Reviewed  RESP PANEL BY RT-PCR (FLU A&B, COVID) [id number]    EKG None  Radiology No results found.  Procedures Procedures    Medications Ordered in ED Medications  acetaminophen (TYLENOL) tablet 325 mg (325 mg Oral Given 05/31/22 8563)    ED Course/ Medical Decision Making/ A&P                           Medical Decision Making Risk OTC drugs.    41 year old male with past medical history significant for schizophrenia, HTN who presents to the emergency department with a chief complaint of URI symptoms.  The patient states that he has had a mildly productive cough for the last 2 days.  He endorses production of yellow sputum.  He denies any fevers or chills.  He denies any chest pain.  He was notably seen in the emergency department overnight at [facility] seeking food and shelter due to  homelessness.  He had no acute complaints at that time.  On arrival, the patient was vitally stable.  Complaining of URI symptoms for the past 2 days.  Given the short duration of symptoms, I do not think a chest x-ray is warranted at this time.  Endorses a headache, cough, chills, body aches.  Differential includes viral URI, COVID-19, influenza, less likely bacterial pneumonia given the short duration of symptoms.  No other complaints at this time.  Was recently seen overnight at [facility] without complaint.  Will obtain COVID and influenza PCR testing.  Offered Tylenol for myalgias.  Psychiatrically does not appear to be responding to internal stimuli. No SI, HI, AVH. Asking for food and something to drink.  COVID-19 influenza PCR testing resulted negative.  Patient provided with food and drink and with no further complaints. Overall stable for discharge at this time.  Outpatient resources provided.   Final Clinical Impression(s) / ED Diagnoses Final diagnoses:  Upper respiratory tract infection, unspecified type  Homelessness    Rx / DC Orders ED Discharge Orders     None         Ernie Avena, MD 05/31/22 1029

## 2022-05-31 NOTE — ED Notes (Signed)
Pt called 3x no answer  

## 2022-05-31 NOTE — ED Triage Notes (Signed)
Pt arrives with c/o of needing a warm place to stay and some food to eat because he is currently homeless.

## 2022-05-31 NOTE — ED Notes (Signed)
Pt has received 3 diet ginger ale drinks, 2 cups of ice, 4 packs of graham crackers, and a ham sandwich since 9am this morning.

## 2022-05-31 NOTE — Discharge Instructions (Addendum)
Schizophrenia/Mental Health Resources ?National Suicide Prevention Lifeline ?1-800-273-TALK (8255) ?http://www.suicidepreventionlifeline.org/ ??988?-Mental Health Crisis Line ? ?National Schizophrenia Foundation ?https://sczaction.org/ ?National Institute of Mental Health ?1-866-615-6464 ?nimhinfo@nih.gov (e-mail) ?www.nimh.nih.gov ?Schizophrenia & Psychosis Action Alliance ?800-493-2094 ?info@sczaction.org ?https://sczaction.org/  ?5 Additional Healthline identified ?Best online Schizophrenia Support Groups? ?Students with Psychosis ? ?Schizophrenia Spectrum Support ? ?Supportiv ($30 monthly fee)  ? ?NAMI Connection Recovery Support Group ? ?Schizophrenia Alliance ? ?Local Resources: ?Outpatient:  ? ?[facility]:  ?([phone] ?[address]   [place], [place]  [address] ?  ? ? ?https://www.[place].gov/services/[place]-[facility]-centers#contact ? ? ?[facility]  ?[facility] [place] ?[address] ?[place], [place] [address] ?[phone] ? ?[place]  ?[facility] at [place] ?[address] ?[place], [place]  [address] ?[phone] ?Local Resources ?(Inpatient) ? ?[facility] ?[address]  ?[place], [place] [address] ?[phone] ? ?[facility] ?Behavioral Medicine Unit and Geriatric Psychiatric Unit   ?[address]  ?[place], [place] [address] ?[phone] ? ? ?[facility] ?https://naminwpiedmontnc.org/support-and-education/mental-health-education/ ? ?NAMI [place] ?https://namigu[place]ford.org/ ? ?Community Housing and Support Resources: ? ?[facility]eless[facility] ?https://pehgc.org/ ? ?Interactive Resource Center ?https://www.interactiveresourcecenter.org/ ? ?[facility] ?https://www.greensborourbanministry.org/ ? ?[facility] [place] (adult men's shelter) ?www.opendoorministrieshp.org ?[address] ?[place], [place] [address] ?336-885-0191 ?[facility] [place] [facility] ?[address] [place], [place] [address] ?[phone] ? ? ?VA's National Homeless Call Center ?1-877-4AID VET (1-877-424-3838) ? ?Veterans Crisis Line ?1-800-273-8255 press 1 ?Confidential chat-VeteransCrisisLine.net ?Or Text to 838255 ? ?United Way ?Call 211 or 1-888-892-1162 ?www.NC211.org ? ?Affordable Housing Resources in [place] ?nchousingsearch.com   ?1-877-428-8844 ? ?Shelters ? ?[facility] Housing Hotline ?336-691-9521 ?8:30am-5:30pm ?[facility] ?[address] ?[place], [place]  [address] ?336-886-5594 ?Male veterans 18+ with substance abuse issues ?Eligibility:  By Referral Only ? ?[facility] ?[address] ?[place], [place] [address] ?[phone] Ext. 347 ?Adult Men & Women ?Eligibility: Valid ID & Social Security Card ?www.greensborourbanministry.org ? ?[facility] ?[address] ?[place], [place]  [address] ?[phone] ?Male veterans 18+ with substance abuse issues ?Eligibility:  By Referral Only ? ?[facility] ?[address] ?[place], [place]  [address] ?[phone] ?Single women 18+ without dependents ?Open 6pm-8am ?Eligibility:  Valid ID & Social Security Card ?Call to check availability  ?http://westendministries.org/leslieshouse.aspx ? ?[facility] ?[address] ?[place], [place]  [address] ?[phone] ?Male veterans 18+ with substance abuse/mental ?health issues ?Eligibility:  By Referral Only ? ?[facility] ?[address] ?[place], [place] [address] ?[phone] ?Call to check availability ?Males 18+ ?Eligibility: Valid ID & Social Security Card ?www.odm-hp.org ? ?[facility] [place] ?[address] ?[place], [place] [address] ?[phone] ?Women 18+ & Families with children ?Eligibility:  Valid ID  & Criminal Background Check ?www.salvationarm[facility]olinas.org/commands/[place] ? ? ?[facility] (Care Management): [phone] ?[facility] [place] [facility]: Behavioral Health 24 Hour Phone Line 1-800-853-5163 ?NAMI Hotline 336-370-4264 ?NAMI [place] 919-788-0801 ? ?2-1-1 Referral Service ?United Way 211 ?Call 211 or 1-888-892-1162 ?www.NC211.org ?A free United Way, 24/7 telephone information and referral service to help link citizens who are seeking help with the community resources they need. In [place], [place] and [place] counties, just dial 211 from your phone. ?Mental Health Association in [place] 336-373-1402 ?Support groups for anxiety, depression and bipolar disorder, schizophrenia,   family and friends, aftermath of suicide, and mental wellness for Latinos (in Spanish). ?[facility] [place] 336-883-7480 ?Offers support groups, [facility] program offers. Psychological, vocational, educational and other rehabilitation services to those who suffer from mental health illness of the 18 and up (5 days a week/5hours a day), offer out patient services like diagnostic evaluation, comprehensive clinical assessments, individual counseling, group therapy, psycho-educational workshops, referral to other specialists, referral to a psychiatrist for an evaluation for medication, consultation, and outreach/training. ?[facility] ([facility]) ?([phone] ?Substance Abuse education, prevention and treatment (detox, assessments, intensive outpatient and inpatient counseling and programs). ?[facility] ?[place] (336) 370-0195, [place] (336) [phone] ?Support groups for posttraumatic stress, depression, and schizophrenia? as well as day programs for individuals with severe mental illness. ?[facility] [place] ([phone] ?[facility]-FREE-336-275-7896 ?[facility] 336-429-5600 or www.kellinfoundation.org ?Telehealth platform, Individual counseling across  the lifespan for both mental health and substance use, support groups, advocacy, case management, virtual villages, resource coordination. ?[facility]- 1-800-256-2452 ?[facility]-FREE-336-676-6840 or 1-800-853-5163 ?336-676-6849. Provides mental health services to all residents regardless of ability to pay. [address], [place]. [place] ?Domestic Violence Crisis Line ?[facility] ?Call for shelter and/or safety planning ?[facility] ?336-889-7273 (24/7) ?[facility] ?336-273-7273 (24/7) ? ?VA Homeless Hotline ?877-424-3838 ? ?Veterans Crisis Line ?1-800-273-8255 press 1 ?Confidential chat-VeteransCrisisLine.net ?Or Text to 838255 ? ?[facility] [place] [facility] ?[address] ?[place], [place] [address] ?[phone] ?Hours: Mon-Fri. 8am-5pm ?[facility] Mobile Crisis Management ?Mobile crisis response for mental health, substance abuse or intellectual/developmental disabilities ?1-877-626-1772 ? ? ? ? ?Disclaimer: This resource list is subject to change at any time and is a starting point for resource identification as of 07/01/2021.  ? ?

## 2022-05-31 NOTE — ED Triage Notes (Signed)
Pt reports chills, cough and headache x few days states "I just need a Tylenol" or something"

## 2022-05-31 NOTE — ED Provider Triage Note (Signed)
Emergency Medicine Provider Triage Evaluation Note  Aaron Rubio , a 41 y.o. male  was evaluated in triage.  Pt found in the lobby this evening, asking for sandwich but was noted to not be registered.  He checked in so he could get something to eat and a blanket.  He has no acute complaints.  Review of Systems  Positive: Homeless, hungry Negative: fever  Physical Exam  BP (!) 154/90 (BP Location: Right Arm)   Pulse 81   Temp 98.6 F (37 C)   Resp 16   SpO2 100%  Gen:   Awake, no distress   Resp:  Normal effort  MSK:   Moves extremities without difficulty  Other:    Medical Decision Making  Medically screening exam initiated at 1:41 AM.  Appropriate orders placed.  Aaron Rubio was informed that the remainder of the evaluation will be completed by another provider, this initial triage assessment does not replace that evaluation, and the importance of remaining in the ED until their evaluation is complete.  No acute complaints.  Homeless and hungry.   Garlon Hatchet, PA-C 05/31/22 [phone]

## 2022-05-31 NOTE — ED Provider Notes (Signed)
[facility] EMERGENCY DEPARTMENT Provider Note   CSN: [id number] Arrival date & time: 05/31/22  0124     History  Chief Complaint  Patient presents with   Homeless    Aaron Rubio is a 41 y.o. male.  The history is provided by the patient and medical records.   41 y.o. M here for food and shelter.  He only checked back in as it was found he had been sitting in the lobby for several hours without being registered as a patient.  He has no acute complaints.  He was given meal and drink.  Home Medications Prior to Admission medications   Medication Sig Start Date End Date Taking? Authorizing Provider  benztropine (COGENTIN) 2 MG tablet Take 1 tablet (2 mg total) by mouth 2 (two) times daily. 07/15/21 08/14/21  Long, Arlyss Repress, MD  hydrOXYzine (ATARAX) 25 MG tablet Take 1-2 tablets (25-50 mg total) by mouth See admin instructions. Take 25 mg by mouth in the morning and 50 mg at bedtime 07/15/21   Long, Arlyss Repress, MD  metoprolol tartrate (LOPRESSOR) 25 MG tablet Take 1 tablet (25 mg total) by mouth in the morning. 07/15/21 08/14/21  Long, Arlyss Repress, MD  naproxen (NAPROSYN) 375 MG tablet Take 1 tablet (375 mg total) by mouth 2 (two) times daily. 05/11/22   Linwood Dibbles, MD      Allergies    Patient has no known allergies.    Review of Systems   Review of Systems  Constitutional:        Hungry, homeless  All other systems reviewed and are negative.   Physical Exam Updated Vital Signs BP (!) 166/95 (BP Location: Right Arm)   Pulse 91   Temp 98.4 F (36.9 C) (Oral)   Resp 20   Ht 6\' 2"  (1.88 m)   Wt 83.9 kg   SpO2 100%   BMI 23.75 kg/m   Physical Exam Vitals and nursing note reviewed.  Constitutional:      Appearance: He is well-developed.     Comments: NAD, several bags of belongings with patient  HENT:     Head: Normocephalic and atraumatic.  Eyes:     Conjunctiva/sclera: Conjunctivae normal.     Pupils: Pupils are equal, round, and reactive to  light.  Cardiovascular:     Rate and Rhythm: Normal rate and regular rhythm.     Heart sounds: Normal heart sounds.  Pulmonary:     Effort: Pulmonary effort is normal.     Breath sounds: Normal breath sounds.  Abdominal:     General: Bowel sounds are normal.     Palpations: Abdomen is soft.  Musculoskeletal:        General: Normal range of motion.     Cervical back: Normal range of motion.  Skin:    General: Skin is warm and dry.  Neurological:     Mental Status: He is alert and oriented to person, place, and time.     ED Results / Procedures / Treatments   Labs (all labs ordered are listed, but only abnormal results are displayed) Labs Reviewed - No data to display  EKG None  Radiology No results found.  Procedures Procedures    Medications Ordered in ED Medications - No data to display  ED Course/ Medical Decision Making/ A&P                           Medical Decision Making  41 y.o. M here requesting food and shelter.  Currently homeless, has essentially been in the ER lobby all night.  No acute complaints.  Vitals are stable.  Stable for discharge.  Given local shelter information and bus pass.  Final Clinical Impression(s) / ED Diagnoses Final diagnoses:  Homeless    Rx / DC Orders ED Discharge Orders     None         Garlon Hatchet, PA-C 05/31/22 1194    Shon Baton, MD 06/01/22 [phone]

## 2022-06-01 ENCOUNTER — Emergency Department (HOSPITAL_COMMUNITY)
Admission: EM | Admit: 2022-06-01 | Discharge: 2022-06-01 | Disposition: A | Discharge status: Home / Self Care | Payer: Medicaid - Dental | Attending: Emergency Medicine | Admitting: Emergency Medicine

## 2022-06-01 ENCOUNTER — Other Ambulatory Visit: Payer: Self-pay

## 2022-06-01 ENCOUNTER — Telehealth (HOSPITAL_COMMUNITY): Payer: Self-pay

## 2022-06-01 DIAGNOSIS — M791 Myalgia, unspecified site: Secondary | ICD-10-CM | POA: Insufficient documentation

## 2022-06-01 DIAGNOSIS — Z5900 Homelessness unspecified: Secondary | ICD-10-CM | POA: Insufficient documentation

## 2022-06-01 MED ORDER — IBUPROFEN 800 MG PO TABS
800.0000 mg | ORAL_TABLET | Freq: Once | ORAL | Status: AC
  Administered 2022-06-01: 800 mg via ORAL
  Filled 2022-06-01: qty 1

## 2022-06-01 MED ORDER — HYDROXYZINE HCL 25 MG PO TABS
25.0000 mg | ORAL_TABLET | Freq: Once | ORAL | Status: AC
  Administered 2022-06-01: 25 mg via ORAL
  Filled 2022-06-01: qty 1

## 2022-06-01 NOTE — ED Triage Notes (Signed)
Pt BIB EMS with reports of body aches from walking.

## 2022-06-01 NOTE — Discharge Instructions (Signed)
He may take Tylenol and ibuprofen for body aches.  Please try and get in touch with your outpatient doctor.  We hope you feel better.

## 2022-06-01 NOTE — ED Provider Notes (Signed)
[facility]-EMERGENCY DEPT Provider Note   CSN: [id number] Arrival date & time: 06/01/22  2231     History  Chief Complaint  Patient presents with   Generalized Body Aches    Aaron Rubio is a 41 y.o. male presenting due to to body aches from walking and being cold.  HPI     Home Medications Prior to Admission medications   Medication Sig Start Date End Date Taking? Authorizing Provider  benztropine (COGENTIN) 2 MG tablet Take 1 tablet (2 mg total) by mouth 2 (two) times daily. 07/15/21 08/14/21  Long, Arlyss Repress, MD  hydrOXYzine (ATARAX) 25 MG tablet Take 1-2 tablets (25-50 mg total) by mouth See admin instructions. Take 25 mg by mouth in the morning and 50 mg at bedtime 07/15/21   Long, Arlyss Repress, MD  metoprolol tartrate (LOPRESSOR) 25 MG tablet Take 1 tablet (25 mg total) by mouth in the morning. 07/15/21 08/14/21  Long, Arlyss Repress, MD  naproxen (NAPROSYN) 375 MG tablet Take 1 tablet (375 mg total) by mouth 2 (two) times daily. 05/11/22   Linwood Dibbles, MD      Allergies    Patient has no known allergies.    Review of Systems   Review of Systems  Physical Exam Updated Vital Signs BP (!) 146/103 (BP Location: Right Arm)   Pulse 73   Temp 98.5 F (36.9 C) (Oral)   Resp 16   SpO2 98%  Physical Exam Vitals and nursing note reviewed.  Constitutional:      Appearance: Normal appearance.  HENT:     Head: Normocephalic and atraumatic.  Eyes:     General: No scleral icterus.    Conjunctiva/sclera: Conjunctivae normal.  Pulmonary:     Effort: Pulmonary effort is normal. No respiratory distress.  Skin:    Findings: No rash.  Neurological:     Mental Status: He is alert.  Psychiatric:        Mood and Affect: Mood normal.     Comments: Pressured speech     ED Results / Procedures / Treatments   Labs (all labs ordered are listed, but only abnormal results are displayed) Labs Reviewed - No data to display  EKG None  Radiology No results  found.  Procedures Procedures   Medications Ordered in ED Medications  hydrOXYzine (ATARAX) tablet 25 mg (has no administration in time range)  ibuprofen (ADVIL) tablet 800 mg (has no administration in time range)    ED Course/ Medical Decision Making/ A&P                           Medical Decision Making Risk Prescription drug management.   41 year old male discharged from the hospital today presenting due to body aches and feeling cold.  Speech is very pressured however does not appear to be responding to internal stimuli.  Medication: Given Atarax and ibuprofen  MDM/disposition: 41 year old male who presented today due to homelessness.  Per chart review he was seen earlier today and twice yesterday.  He says that he will be locked up if he is discharged.  I told him we do not have a medical indication to keep him in the hospital.  To be discharged at this time.  Final Clinical Impression(s) / ED Diagnoses Final diagnoses:  Homelessness    Rx / DC Orders ED Discharge Orders     None      Results and diagnoses were explained to the patient. Return  precautions discussed in full. Patient had no additional questions and expressed complete understanding.   This chart was dictated using voice recognition software.  Despite best efforts to proofread,  errors can occur which can change the documentation meaning.     Woodroe Chen 06/01/22 2244    Wynetta Fines, MD 06/01/22 2249

## 2022-06-01 NOTE — Discharge Instructions (Addendum)
I have given you resources for shelters within the area please follow-up.  I will see him resources for behavioral health unit please follow-up with them.  Come back to the emergency department if you develop chest pain, shortness of breath, severe abdominal pain, uncontrolled nausea, vomiting, diarrhea.

## 2022-06-01 NOTE — ED Provider Notes (Signed)
[facility] EMERGENCY DEPARTMENT Provider Note   CSN: [id number] Arrival date & time: 05/31/22  2150     History  Chief Complaint  Patient presents with   Headache    Aaron Rubio is a 41 y.o. male.  HPI   Patient with medical history including Rubio, Aaron Rubio, states this is not recent, and would like a CT scan of his Rubio, when asked him if he is having any headaches  he says he just  wants a scan.  He also notes that he has pain in his legs and arms as he has to walk around due to being homeless, no trauma,.  Patient is a poor historian and Requesting  imaging as well as something to eat or drink.  I reviewed patient's chart has been seen in the past, he was most recently seen today on 2 separate occasions, first time was at 1 AM, he was sitting in the lobby for hours and then told him that he just needed a place to stay and have some to eat and drink.  He was discharged home shortly after.  Patient rechecked and at 10:30 AM who complains of a URI-like symptoms, states he had a slight cough for 2 days, with yellow sputum, and a benign physical exam and was discharged again.    Home Medications Prior to Admission medications   Medication Sig Start Date End Date Taking? Authorizing Provider  benztropine (COGENTIN) 2 MG tablet Take 1 tablet (2 mg total) by mouth 2 (two) times daily. 07/15/21 08/14/21  Long, Arlyss Repress, MD  hydrOXYzine (ATARAX) 25 MG tablet Take 1-2 tablets (25-50 mg total) by mouth See admin instructions. Take 25 mg by mouth in the morning and 50 mg at bedtime 07/15/21   Long, Arlyss Repress, MD  metoprolol tartrate (LOPRESSOR) 25 MG tablet Take 1 tablet (25 mg total) by mouth in the morning. 07/15/21 08/14/21  Long, Arlyss Repress, MD  naproxen (NAPROSYN) 375 MG tablet Take 1 tablet (375 mg total) by mouth 2 (two) times daily.  05/11/22   Linwood Dibbles, MD      Allergies    Patient has no known allergies.    Review of Systems   Review of Systems  Constitutional:  Negative for chills and fever.  Respiratory:  Negative for shortness of breath.   Cardiovascular:  Negative for chest pain.  Gastrointestinal:  Negative for abdominal pain.  Neurological:  Negative for headaches.    Physical Exam Updated Vital Signs BP (!) 152/102 (BP Location: Left Arm)   Pulse 85   Temp (!) 97.4 F (36.3 C) (Oral)   Resp 16   SpO2 100%  Physical Exam Vitals and nursing note reviewed.  Constitutional:      General: He is not in acute distress.    Appearance: He is not ill-appearing.     Comments: Not ill-appearing, no acute distress  HENT:     Rubio: Normocephalic and atraumatic.     Comments: There is no deformity of the Rubio present no raccoon eyes or Battle sign noted.    Nose: No congestion.  Eyes:     Conjunctiva/sclera: Conjunctivae normal.  Cardiovascular:     Rate and Rhythm: Normal rate and regular rhythm.     Pulses: Normal pulses.  Pulmonary:     Effort: Pulmonary effort is normal.  Skin:  General: Skin is warm and dry.  Neurological:     Mental Status: He is alert.     Comments: No facial asymmetry no difficulty with word finding following two-step commands there is no unilateral weakness present.  Patient ambulating without difficulty.  Psychiatric:        Mood and Affect: Mood normal.     Comments: Not endorsing  suicidal /homicidal ideations does not appear to be respond to internal stimuli.     ED Results / Procedures / Treatments   Labs (all labs ordered are listed, but only abnormal results are displayed) Labs Reviewed - No data to display  EKG None  Radiology No results found.  Procedures Procedures    Medications Ordered in ED Medications - No data to display  ED Course/ Medical Decision Making/ A&P                           Medical Decision Making  This patient presents  to the ED for concern of multiple complaints, this involves an extensive number of treatment options, and is a complaint that carries with it a high risk of complications and morbidity.  The differential diagnosis includes CVA, intracranial Rubio bleed, psychiatric emergency    Additional history obtained:  Additional history obtained from N/A External records from outside source obtained and reviewed including previous ER notes   Co morbidities that complicate the patient evaluation  N/A  Social Determinants of Health:  Homeless    Lab Tests:  I Ordered, and personally interpreted labs.  The pertinent results include:  n/a   Imaging Studies ordered:  I ordered imaging studies including n/a  I independently visualized and interpreted imaging which showed n/a I agree with the radiologist interpretation   Cardiac Monitoring:  The patient was maintained on a cardiac monitor.  I personally viewed and interpreted the cardiac monitored which showed an underlying rhythm of: n/a   Medicines ordered and prescription drug management:  I ordered medication including n/a I have reviewed the patients home medicines and have made adjustments as needed  Critical Interventions:  M/a   Reevaluation:  Presents with multiple complaints, he is in no acute distress, he is asking for food and soda, will provide him with both and monitor.  Patient was allowed to resign in the ED until the morning, he is stable at this time and is ready for discharge.  Consultations Obtained:  N/a   Test Considered:  CT Rubio-deferred as my suspicion for intracranial Rubio bleed is low not on anticoag, no recent trauma, there is no focal deficit present my exam.    Rule out Suspicion for psychiatric emergency is low, does not appear to respond to internal stimuli, not endorsing suicidal homicidal ideations.  I doubt withdrawals as he is nontremulous my exam nontoxic appearing, vital signs are  reassuring    Dispostion and problem list  After consideration of the diagnostic results and the patients response to treatment, I feel that the patent would benefit from discharge.  Homeless-special patient is here because he is homeless, will provide him with resources for shelters within the area.            Final Clinical Impression(s) / ED Diagnoses Final diagnoses:  Homeless    Rx / DC Orders ED Discharge Orders     None         Barnie Del 06/01/22 3790    Sabas Sous, MD 06/01/22 [phone]

## 2022-06-01 NOTE — ED Notes (Signed)
Patient states his head isn't hurting any longer states after he got something to eat and drink earlier he felt better. However when he thought he may be discharged states his head is hurting and he needs any xray. Patient laughs inappropriately and is talking to the air.

## 2022-06-01 NOTE — Telephone Encounter (Signed)
Patient walked in requesting rehousing, phone cards, grocery cards, and multiple bus passes to travel. Patient had discharge papers from 05/11/22 along with RX scripts. Patient appeared to not be in a healthy state of mind. Patient was prompt to get medication management at the request of last provider in [facility]. Patient declined services provided to him at the time of walk in. Patient request multiple passes for the bus. Patient was informed that we only supply one pass per patient. Patient was given 1 pass.Patient verbally stated he understand he was declining service, and nothing further was needed at the time of walk in. FJE

## 2022-06-01 NOTE — ED Notes (Signed)
Discharge instructions given , patient refused to let RN go over instructions. Papers given .

## 2022-06-01 NOTE — ED Notes (Signed)
[place] sandwich and 2 cokes given.

## 2022-06-03 ENCOUNTER — Other Ambulatory Visit: Payer: Self-pay

## 2022-06-03 ENCOUNTER — Emergency Department (HOSPITAL_COMMUNITY)
Admission: EM | Admit: 2022-06-03 | Discharge: 2022-06-04 | Disposition: A | Discharge status: Home / Self Care | Payer: Medicaid Other | Attending: Emergency Medicine | Admitting: Emergency Medicine

## 2022-06-03 DIAGNOSIS — Z79899 Other long term (current) drug therapy: Secondary | ICD-10-CM | POA: Insufficient documentation

## 2022-06-03 DIAGNOSIS — J101 Influenza due to other identified influenza virus with other respiratory manifestations: Secondary | ICD-10-CM | POA: Insufficient documentation

## 2022-06-03 DIAGNOSIS — R519 Headache, unspecified: Secondary | ICD-10-CM | POA: Diagnosis present

## 2022-06-03 DIAGNOSIS — I10 Essential (primary) hypertension: Secondary | ICD-10-CM | POA: Insufficient documentation

## 2022-06-03 DIAGNOSIS — Z20822 Contact with and (suspected) exposure to covid-19: Secondary | ICD-10-CM | POA: Insufficient documentation

## 2022-06-03 NOTE — ED Triage Notes (Signed)
Pt presents with sore throat x 2 days. States tonight while smoking a cigarette he felt dizzy but has since subsided.

## 2022-06-04 ENCOUNTER — Emergency Department (HOSPITAL_COMMUNITY): Payer: Medicaid Other

## 2022-06-04 LAB — CBC WITH DIFFERENTIAL/PLATELET
Abs Immature Granulocytes: 0.02 10*3/uL (ref 0.00–0.07)
Basophils Absolute: 0 10*3/uL (ref 0.0–0.1)
Basophils Relative: 0 %
Eosinophils Absolute: 0.1 10*3/uL (ref 0.0–0.5)
Eosinophils Relative: 1 %
HCT: 38.6 % — ABNORMAL LOW (ref 39.0–52.0)
Hemoglobin: 12.5 g/dL — ABNORMAL LOW (ref 13.0–17.0)
Immature Granulocytes: 1 %
Lymphocytes Relative: 11 %
Lymphs Abs: 0.5 10*3/uL — ABNORMAL LOW (ref 0.7–4.0)
MCH: 27.2 pg (ref 26.0–34.0)
MCHC: 32.4 g/dL (ref 30.0–36.0)
MCV: 84.1 fL (ref 80.0–100.0)
Monocytes Absolute: 0.6 10*3/uL (ref 0.1–1.0)
Monocytes Relative: 15 %
Neutro Abs: 3.1 10*3/uL (ref 1.7–7.7)
Neutrophils Relative %: 72 %
Platelets: 169 10*3/uL (ref 150–400)
RBC: 4.59 MIL/uL (ref 4.22–5.81)
RDW: 13.2 % (ref 11.5–15.5)
WBC: 4.3 10*3/uL (ref 4.0–10.5)
nRBC: 0 % (ref 0.0–0.2)

## 2022-06-04 LAB — RESP PANEL BY RT-PCR (FLU A&B, COVID) ARPGX2
Influenza A by PCR: POSITIVE — AB
Influenza B by PCR: NEGATIVE
SARS Coronavirus 2 by RT PCR: NEGATIVE

## 2022-06-04 LAB — BASIC METABOLIC PANEL
Anion gap: 8 (ref 5–15)
BUN: 11 mg/dL (ref 6–20)
CO2: 25 mmol/L (ref 22–32)
Calcium: 8.9 mg/dL (ref 8.9–10.3)
Chloride: 106 mmol/L (ref 98–111)
Creatinine, Ser: 1.11 mg/dL (ref 0.61–1.24)
GFR, Estimated: >60 mL/min (ref >60)
Glucose, Bld: 90 mg/dL (ref 70–99)
Potassium: 3.4 mmol/L — ABNORMAL LOW (ref 3.5–5.1)
Sodium: 139 mmol/L (ref 135–145)

## 2022-06-04 MED ORDER — ONDANSETRON HCL 4 MG PO TABS: 4.0000 mg | ORAL_TABLET | Freq: Three times a day (TID) | ORAL | 0 refills | Status: DC | PRN

## 2022-06-04 MED ORDER — BENZONATATE 100 MG PO CAPS: 100.0000 mg | ORAL_CAPSULE | Freq: Three times a day (TID) | ORAL | 0 refills | Status: DC | PRN

## 2022-06-04 MED ORDER — CYCLOBENZAPRINE HCL 10 MG PO TABS: 10.0000 mg | ORAL_TABLET | Freq: Two times a day (BID) | ORAL | 0 refills | Status: DC | PRN

## 2022-06-04 NOTE — Discharge Instructions (Signed)
Your history, exam, work-up today confirmed you have influenza A as the cause of your symptoms today.  Your CT head was reassuring and your labs also did not show concerning findings.  Please use the cough medicine to help with your cough and you may also use the nausea medicine if you develop GI symptoms which can happen with the fluid.  Please rest and stay hydrated and follow-up with a primary doctor.  If any symptoms change or worsen acutely, please return to the nearest emergency department.

## 2022-06-04 NOTE — ED Provider Notes (Signed)
[facility] EMERGENCY DEPARTMENT Provider Note   CSN: [id number] Arrival date & time: 06/03/22  2310     History  Chief Complaint  Patient presents with   Sore Throat    Aaron Rubio is a 41 y.o. male.  The history is provided by the patient and medical records. No language interpreter was used.  Sore Throat This is a new problem. The current episode started more than 2 days ago. The problem occurs constantly. The problem has not changed since onset.Associated symptoms include headaches. Pertinent negatives include no chest pain, no abdominal pain and no shortness of breath. Nothing aggravates the symptoms. Nothing relieves the symptoms. He has tried nothing for the symptoms. The treatment provided no relief.       Home Medications Prior to Admission medications   Medication Sig Start Date End Date Taking? Authorizing Provider  benztropine (COGENTIN) 2 MG tablet Take 1 tablet (2 mg total) by mouth 2 (two) times daily. 07/15/21 08/14/21  Long, Arlyss Repress, MD  hydrOXYzine (ATARAX) 25 MG tablet Take 1-2 tablets (25-50 mg total) by mouth See admin instructions. Take 25 mg by mouth in the morning and 50 mg at bedtime 07/15/21   Long, Arlyss Repress, MD  metoprolol tartrate (LOPRESSOR) 25 MG tablet Take 1 tablet (25 mg total) by mouth in the morning. 07/15/21 08/14/21  Long, Arlyss Repress, MD  naproxen (NAPROSYN) 375 MG tablet Take 1 tablet (375 mg total) by mouth 2 (two) times daily. 05/11/22   Linwood Dibbles, MD      Allergies    Patient has no known allergies.    Review of Systems   Review of Systems  Constitutional:  Positive for chills, fatigue and fever.  HENT:  Positive for congestion.   Respiratory:  Positive for cough. Negative for chest tightness, shortness of breath and wheezing.   Cardiovascular:  Negative for chest pain, palpitations and leg swelling.  Gastrointestinal:  Negative for abdominal pain, constipation, diarrhea, nausea and vomiting.  Genitourinary:   Negative for dysuria and flank pain.  Musculoskeletal:  Negative for back pain and neck pain.  Skin:  Negative for rash.  Neurological:  Positive for headaches.  Psychiatric/Behavioral:  Negative for agitation and confusion.   All other systems reviewed and are negative.   Physical Exam Updated Vital Signs BP (!) 166/104 (BP Location: Right Arm)   Pulse 84   Temp 99.4 F (37.4 C) (Oral)   Resp 16   SpO2 100%  Physical Exam Vitals and nursing note reviewed.  Constitutional:      General: He is not in acute distress.    Appearance: He is well-developed. He is not ill-appearing, toxic-appearing or diaphoretic.  HENT:     Head: Normocephalic and atraumatic.     Nose: Congestion and rhinorrhea present.     Mouth/Throat:     Mouth: Mucous membranes are moist.     Pharynx: No oropharyngeal exudate or posterior oropharyngeal erythema.  Eyes:     Extraocular Movements: Extraocular movements intact.     Conjunctiva/sclera: Conjunctivae normal.     Pupils: Pupils are equal, round, and reactive to light.  Cardiovascular:     Rate and Rhythm: Normal rate and regular rhythm.     Pulses: Normal pulses.     Heart sounds: No murmur heard. Pulmonary:     Effort: Pulmonary effort is normal. No respiratory distress.     Breath sounds: Normal breath sounds. No wheezing, rhonchi or rales.  Chest:     Chest  wall: No tenderness.  Abdominal:     General: Abdomen is flat.     Palpations: Abdomen is soft.     Tenderness: There is no abdominal tenderness.  Musculoskeletal:        General: No swelling or tenderness.     Cervical back: Neck supple. No tenderness.     Right lower leg: No edema.     Left lower leg: No edema.  Skin:    General: Skin is warm and dry.     Capillary Refill: Capillary refill takes less than 2 seconds.     Findings: No erythema or rash.  Neurological:     General: No focal deficit present.     Mental Status: He is alert.  Psychiatric:        Mood and Affect: Mood  normal.     ED Results / Procedures / Treatments   Labs (all labs ordered are listed, but only abnormal results are displayed) Labs Reviewed  RESP PANEL BY RT-PCR (FLU A&B, COVID) ARPGX2 - Abnormal; Notable for the following components:      Result Value   Influenza A by PCR POSITIVE (*)    All other components within normal limits  CBC WITH DIFFERENTIAL/PLATELET - Abnormal; Notable for the following components:   Hemoglobin 12.5 (*)    HCT 38.6 (*)    Lymphs Abs 0.5 (*)    All other components within normal limits  BASIC METABOLIC PANEL - Abnormal; Notable for the following components:   Potassium 3.4 (*)    All other components within normal limits    EKG None  Radiology CT Head Wo Contrast  Result Date: 06/04/2022 CLINICAL DATA:  New onset headache EXAM: CT HEAD WITHOUT CONTRAST TECHNIQUE: Contiguous axial images were obtained from the base of the skull through the vertex without intravenous contrast. RADIATION DOSE REDUCTION: This exam was performed according to the departmental dose-optimization program which includes automated exposure control, adjustment of the mA and/or kV according to patient size and/or use of iterative reconstruction technique. COMPARISON:  05/11/2022 FINDINGS: Brain: There is no mass, hemorrhage or extra-axial collection. The size and configuration of the ventricles and extra-axial CSF spaces are normal. The brain parenchyma is normal, without acute or chronic infarction. Vascular: No abnormal hyperdensity of the major intracranial arteries or dural venous sinuses. No intracranial atherosclerosis. Skull: The visualized skull base, calvarium and extracranial soft tissues are normal. Sinuses/Orbits: No fluid levels or advanced mucosal thickening of the visualized paranasal sinuses. No mastoid or middle ear effusion. The orbits are normal. IMPRESSION: Normal head CT. Electronically Signed   By: Ulyses Jarred M.D.   On: 06/04/2022 01:21    Procedures Procedures     Medications Ordered in ED Medications - No data to display  ED Course/ Medical Decision Making/ A&P                           Medical Decision Making   Aaron Rubio is a 41 y.o. male with a past medical history significant for schizophrenia and hypertension who presents for continued URI symptoms for the last few days.  He reports that he was feeling some lightheadedness and headaches and checked in for patient.  Patient reports congestion, cough, and diffuse myalgias and malaise.  He has had the symptoms going on for the last several days.  He reports no nausea or vomiting but has been eating and drinking less.  He denies any acute trauma.  He  is not reporting current chest pain, shortness of breath, or focal neurologic deficits.  On exam, lungs clear and chest nontender.  He is coughing.  Abdomen nontender.  He is moving all extremities.  Mucous membranes are moist and he does not appear clinically dehydrated.  Patient otherwise resting comfortably with his cough.  Patient had work-up starting in triage over 9 hours ago.  Patient had a head CT that was reassuring and had labs that confirmed he does indeed have flu a.  He does not have leukocytosis and has mild anemia.  Metabolic panel overall reassuring.  Suspect his influenza is causing all of his symptoms.  Patient was informed this finding and he agrees this makes sense.  He agrees to getting a prescription for some cough medicine and will also give prescription for nausea medicine if he gets more nausea.  We will allow a p.o. challenge initially to make sure he can tolerate eating and drinking.  Given his reassuring work-up otherwise, not feel needs admission or further work-up at this time.  Patient agrees.  Anticipate discharge after he passes a p.o. challenge with prescription for nausea medicine and Tessalon.  We discussed that as his symptoms are going on for at least 4 days he is outside any window for Tamiflu at this  time.  Anticipate discharge shortly.  Patient will be discharged with a prescription for Tessalon and nausea medicine.  He will follow-up with PCP and understood return precautions.  Patient discharged in stable condition.        Final Clinical Impression(s) / ED Diagnoses Final diagnoses:  Influenza A    Clinical Impression: 1. Influenza A     Disposition: Discharge  Condition: Good  I have discussed the results, Dx and Tx plan with the pt(& family if present). He/she/they expressed understanding and agree(s) with the plan. Discharge instructions discussed at great length. Strict return precautions discussed and pt &/or family have verbalized understanding of the instructions. No further questions at time of discharge.    New Prescriptions   BENZONATATE (TESSALON) 100 MG CAPSULE    Take 1 capsule (100 mg total) by mouth 3 (three) times daily as needed for cough.   ONDANSETRON (ZOFRAN) 4 MG TABLET    Take 1 tablet (4 mg total) by mouth every 8 (eight) hours as needed for nausea or vomiting.    Follow Up: [facility] [place] [phone] Follow up PLEASE call and make a appointment to establish a primary care doctor, they offer finacial counselling and a pharmacy  [facility] [address] [place]        Kazia Grisanti, Gwenyth Allegra, MD 06/04/22 1036

## 2022-06-04 NOTE — ED Notes (Signed)
PO challenged passed.

## 2022-06-04 NOTE — ED Provider Triage Note (Signed)
Emergency Medicine Provider Triage Evaluation Note  Aaron Rubio , a 41 y.o. male  was evaluated in triage.  Patient with tree of homelessness, malingering, schizophrenia presents today for evaluation of multiple complaints including pharyngitis ongoing for couple days.  He also states he is having dizziness that started around 11 AM today and has persisted up till now.  He is not endorses pressure of his head along with this.  Denies visual change.  He also states he had issue with his balance.  Denies any weakness, numbness, or paresthesias.  Review of Systems  Positive: As above Negative: As above  Physical Exam  BP (!) 151/98 (BP Location: Right Arm)   Pulse 83   Temp 99.4 F (37.4 C)   Resp 16   SpO2 98%  Gen:   Awake, no distress   Resp:  Normal effort  MSK:   Moves extremities without difficulty  Other:  Cranial nerves III through XII intact.  Full range of motion of bilateral upper and lower extremities with 5/5 strength.  Tongue midline.  Without pronator drift.  Pupils equal round reactive to light.  Medical Decision Making  Medically screening exam initiated at 12:11 AM.  Appropriate orders placed.  Jani Ploeger was informed that the remainder of the evaluation will be completed by another provider, this initial triage assessment does not replace that evaluation, and the importance of remaining in the ED until their evaluation is complete.     Marita Kansas, PA-C 06/04/22 [phone]

## 2022-06-04 NOTE — ED Notes (Signed)
Pt provided with meal, beverage, and snacks.

## 2022-06-06 ENCOUNTER — Emergency Department (HOSPITAL_COMMUNITY): Payer: Medicaid Other

## 2022-06-06 ENCOUNTER — Emergency Department (HOSPITAL_COMMUNITY)
Admission: EM | Admit: 2022-06-06 | Discharge: 2022-06-07 | Disposition: A | Discharge status: Home / Self Care | Payer: Medicaid Other | Attending: Emergency Medicine | Admitting: Emergency Medicine

## 2022-06-06 ENCOUNTER — Other Ambulatory Visit: Payer: Self-pay

## 2022-06-06 ENCOUNTER — Encounter (HOSPITAL_COMMUNITY): Payer: Self-pay

## 2022-06-06 DIAGNOSIS — R5383 Other fatigue: Secondary | ICD-10-CM | POA: Diagnosis not present

## 2022-06-06 DIAGNOSIS — R531 Weakness: Secondary | ICD-10-CM | POA: Diagnosis not present

## 2022-06-06 DIAGNOSIS — R630 Anorexia: Secondary | ICD-10-CM | POA: Diagnosis not present

## 2022-06-06 DIAGNOSIS — R42 Dizziness and giddiness: Secondary | ICD-10-CM | POA: Diagnosis not present

## 2022-06-06 DIAGNOSIS — J029 Acute pharyngitis, unspecified: Secondary | ICD-10-CM | POA: Diagnosis not present

## 2022-06-06 DIAGNOSIS — Z5321 Procedure and treatment not carried out due to patient leaving prior to being seen by health care provider: Secondary | ICD-10-CM | POA: Diagnosis not present

## 2022-06-06 LAB — COMPREHENSIVE METABOLIC PANEL
ALT: 22 U/L (ref 0–44)
AST: 30 U/L (ref 15–41)
Albumin: 3.4 g/dL — ABNORMAL LOW (ref 3.5–5.0)
Alkaline Phosphatase: 57 U/L (ref 38–126)
Anion gap: 8 (ref 5–15)
BUN: 17 mg/dL (ref 6–20)
CO2: 23 mmol/L (ref 22–32)
Calcium: 8.4 mg/dL — ABNORMAL LOW (ref 8.9–10.3)
Chloride: 107 mmol/L (ref 98–111)
Creatinine, Ser: 1.09 mg/dL (ref 0.61–1.24)
GFR, Estimated: >60 mL/min (ref >60)
Glucose, Bld: 87 mg/dL (ref 70–99)
Potassium: 3.5 mmol/L (ref 3.5–5.1)
Sodium: 138 mmol/L (ref 135–145)
Total Bilirubin: 0.4 mg/dL (ref 0.3–1.2)
Total Protein: 5.8 g/dL — ABNORMAL LOW (ref 6.5–8.1)

## 2022-06-06 LAB — CBC WITH DIFFERENTIAL/PLATELET
Abs Immature Granulocytes: 0 10*3/uL (ref 0.00–0.07)
Basophils Absolute: 0 10*3/uL (ref 0.0–0.1)
Basophils Relative: 0 %
Eosinophils Absolute: 0 10*3/uL (ref 0.0–0.5)
Eosinophils Relative: 2 %
HCT: 38 % — ABNORMAL LOW (ref 39.0–52.0)
Hemoglobin: 12.1 g/dL — ABNORMAL LOW (ref 13.0–17.0)
Immature Granulocytes: 0 %
Lymphocytes Relative: 30 %
Lymphs Abs: 0.8 10*3/uL (ref 0.7–4.0)
MCH: 26.7 pg (ref 26.0–34.0)
MCHC: 31.8 g/dL (ref 30.0–36.0)
MCV: 83.7 fL (ref 80.0–100.0)
Monocytes Absolute: 0.4 10*3/uL (ref 0.1–1.0)
Monocytes Relative: 17 %
Neutro Abs: 1.3 10*3/uL — ABNORMAL LOW (ref 1.7–7.7)
Neutrophils Relative %: 51 %
Platelets: 142 10*3/uL — ABNORMAL LOW (ref 150–400)
RBC: 4.54 MIL/uL (ref 4.22–5.81)
RDW: 13.2 % (ref 11.5–15.5)
WBC: 2.5 10*3/uL — ABNORMAL LOW (ref 4.0–10.5)
nRBC: 0 % (ref 0.0–0.2)

## 2022-06-06 NOTE — ED Triage Notes (Signed)
Pt walked into triage complaining of body aches and needing a place to stay and some food   Pt with limited responses to questions from staff.

## 2022-06-06 NOTE — ED Provider Triage Note (Signed)
Emergency Medicine Provider Triage Evaluation Note  Aaron Rubio , a 42 y.o. male  was evaluated in triage.  Pt complains of generalized weakness, lightheadedness, sore throat, fatigue.  Diagnosed with flu 2 days ago.  Reports poor appetite with dizziness and lightheadedness.  No chest pain or shortness of breath.  No vomiting.  Interested in getting a place to stay..  Review of Systems  Positive: Fatigue, dizziness, lightheadedness, body aches and chills Negative: Chest pain  Physical Exam  There were no vitals taken for this visit. Gen:   Awake, no distress   Resp:  Normal effort  MSK:   Moves extremities without difficulty  Other:  Generalized weakness  Medical Decision Making  Medically screening exam initiated at 9:31 PM.  Appropriate orders placed.  Aaron Rubio was informed that the remainder of the evaluation will be completed by another provider, this initial triage assessment does not replace that evaluation, and the importance of remaining in the ED until their evaluation is complete.  Flu positive with weakness and lightheadedness   Aaron Octave, MD 06/06/22 2132

## 2022-06-07 ENCOUNTER — Emergency Department (HOSPITAL_COMMUNITY): Payer: Medicaid Other

## 2022-06-07 ENCOUNTER — Emergency Department (HOSPITAL_COMMUNITY)
Admission: EM | Admit: 2022-06-07 | Discharge: 2022-06-07 | Disposition: A | Discharge status: Home / Self Care | Payer: Medicaid Other | Attending: Emergency Medicine | Admitting: Emergency Medicine

## 2022-06-07 ENCOUNTER — Other Ambulatory Visit: Payer: Self-pay

## 2022-06-07 ENCOUNTER — Emergency Department (HOSPITAL_COMMUNITY)
Admission: EM | Admit: 2022-06-07 | Discharge: 2022-06-08 | Disposition: A | Discharge status: Home / Self Care | Payer: Medicaid Other | Source: Home / Self Care | Attending: Emergency Medicine | Admitting: Emergency Medicine

## 2022-06-07 DIAGNOSIS — I10 Essential (primary) hypertension: Secondary | ICD-10-CM | POA: Insufficient documentation

## 2022-06-07 DIAGNOSIS — J111 Influenza due to unidentified influenza virus with other respiratory manifestations: Secondary | ICD-10-CM

## 2022-06-07 DIAGNOSIS — Z5900 Homelessness unspecified: Secondary | ICD-10-CM | POA: Insufficient documentation

## 2022-06-07 DIAGNOSIS — Z79899 Other long term (current) drug therapy: Secondary | ICD-10-CM | POA: Insufficient documentation

## 2022-06-07 DIAGNOSIS — J101 Influenza due to other identified influenza virus with other respiratory manifestations: Secondary | ICD-10-CM | POA: Insufficient documentation

## 2022-06-07 NOTE — ED Triage Notes (Signed)
Patient reports productive cough for several days .

## 2022-06-07 NOTE — ED Provider Notes (Signed)
[facility] DEPT Provider Note   CSN: [id number] Arrival date & time: 06/07/22  [id number]     History  Chief Complaint  Patient presents with   Influenza    Aaron Rubio is a 41 y.o. male.   Influenza    Patient with history of unstable housing, hypertension, schizophrenia presents to the ED due to chills.  Patient states she was diagnosed with the flu recently, states she is still feeling cold outside requesting somewhere warm to be.  Denies any chest pain or shortness of breath.  Home Medications Prior to Admission medications   Medication Sig Start Date End Date Taking? Authorizing Provider  benzonatate (TESSALON) 100 MG capsule Take 1 capsule (100 mg total) by mouth 3 (three) times daily as needed for cough. 06/04/22   Tegeler, Gwenyth Allegra, MD  benztropine (COGENTIN) 2 MG tablet Take 1 tablet (2 mg total) by mouth 2 (two) times daily. 07/15/21 08/14/21  Long, Wonda Olds, MD  cyclobenzaprine (FLEXERIL) 10 MG tablet Take 1 tablet (10 mg total) by mouth 2 (two) times daily as needed for muscle spasms. 06/04/22   Tegeler, Gwenyth Allegra, MD  hydrOXYzine (ATARAX) 25 MG tablet Take 1-2 tablets (25-50 mg total) by mouth See admin instructions. Take 25 mg by mouth in the morning and 50 mg at bedtime 07/15/21   Long, Wonda Olds, MD  metoprolol tartrate (LOPRESSOR) 25 MG tablet Take 1 tablet (25 mg total) by mouth in the morning. 07/15/21 08/14/21  Long, Wonda Olds, MD  naproxen (NAPROSYN) 375 MG tablet Take 1 tablet (375 mg total) by mouth 2 (two) times daily. 05/11/22   Dorie Rank, MD  ondansetron (ZOFRAN) 4 MG tablet Take 1 tablet (4 mg total) by mouth every 8 (eight) hours as needed for nausea or vomiting. 06/04/22   Tegeler, Gwenyth Allegra, MD      Allergies    Patient has no known allergies.    Review of Systems   Review of Systems  Physical Exam Updated Vital Signs BP (!) 144/102 (BP Location: Left Arm)   Pulse 72   Temp 99 F (37.2 C) (Oral)   Resp 18    SpO2 100%  Physical Exam Vitals and nursing note reviewed. Exam conducted with a chaperone present.  Constitutional:      Appearance: Normal appearance.  HENT:     Head: Normocephalic and atraumatic.  Eyes:     General: No scleral icterus.       Right eye: No discharge.        Left eye: No discharge.     Extraocular Movements: Extraocular movements intact.     Pupils: Pupils are equal, round, and reactive to light.  Cardiovascular:     Rate and Rhythm: Normal rate and regular rhythm.     Pulses: Normal pulses.     Heart sounds: Normal heart sounds. No murmur heard.    No friction rub. No gallop.  Pulmonary:     Effort: Pulmonary effort is normal. No respiratory distress.     Breath sounds: Normal breath sounds.  Abdominal:     General: Abdomen is flat. Bowel sounds are normal. There is no distension.     Palpations: Abdomen is soft.     Tenderness: There is no abdominal tenderness.  Skin:    General: Skin is warm and dry.     Coloration: Skin is not jaundiced.  Neurological:     Mental Status: He is alert. Mental status is at baseline.  Coordination: Coordination normal.     ED Results / Procedures / Treatments   Labs (all labs ordered are listed, but only abnormal results are displayed) Labs Reviewed - No data to display  EKG None  Radiology DG Chest 2 View  Result Date: 06/06/2022 CLINICAL DATA:  Cough and fever. Body aches. EXAM: CHEST - 2 VIEW COMPARISON:  05/11/2022 FINDINGS: The cardiomediastinal contours are normal. Minimal patchy right infrahilar airspace disease. Pulmonary vasculature is normal. No pleural effusion or pneumothorax. No acute osseous abnormalities are seen. IMPRESSION: Minimal patchy right infrahilar airspace disease, atelectasis versus pneumonia. Electronically Signed   By: Narda Rutherford M.D.   On: 06/06/2022 22:07    Procedures Procedures    Medications Ordered in ED Medications - No data to display  ED Course/ Medical Decision  Making/ A&P                           Medical Decision Making  This is a 41 year old male with unfortunate history of homelessness presenting to the ED due to chills.  Patient was diagnosed with influenza A 06/03/2022, he states it is cold outside and he is requesting somewhere to stay because he has the flu.    He does not have any chest pain or shortness of breath or systemic symptoms, unfortunately I do think there is a bit of malingering secondary to desire for warmth as it is cold outside. Shelter Facilities manager.  He is not hypoxic, vital signs are stable and I do not not think any additional work-up is indicated at this time.        Final Clinical Impression(s) / ED Diagnoses Final diagnoses:  Influenza    Rx / DC Orders ED Discharge Orders     None         Theron Arista, Cordelia Poche 06/07/22 1944    Glynn Octave, MD 06/07/22 [phone]

## 2022-06-07 NOTE — ED Triage Notes (Signed)
Pt reports he was dx with the flu couple days ago. Pt reports dry mouth and cough.

## 2022-06-07 NOTE — ED Provider Triage Note (Signed)
Emergency Medicine Provider Triage Evaluation Note  Aaron Rubio , a 41 y.o. male  was evaluated in triage.  Pt complains of cough and fever.  Homeless.  Recent flu diagnosis.  Review of Systems  Positive: Fever, cough, fatigue Negative: vomiting  Physical Exam  BP (!) 140/87 (BP Location: Right Arm)   Pulse 77   Temp 98.6 F (37 C)   Resp 18   SpO2 98%  Gen:   Awake, no distress   Resp:  Normal effort  MSK:   Moves extremities without difficulty  Other:    Medical Decision Making  Medically screening exam initiated at 10:09 PM.  Appropriate orders placed.  Aaron Rubio was informed that the remainder of the evaluation will be completed by another provider, this initial triage assessment does not replace that evaluation, and the importance of remaining in the ED until their evaluation is complete.     Roxy Horseman, PA-C 06/07/22 2210

## 2022-06-08 MED ORDER — IBUPROFEN 800 MG PO TABS
800.0000 mg | ORAL_TABLET | Freq: Once | ORAL | Status: AC
  Administered 2022-06-08: 800 mg via ORAL
  Filled 2022-06-08: qty 1

## 2022-06-08 NOTE — ED Notes (Signed)
Patient states he is still dealing with the flu, c/o generalized bodyaches was seen at another hospital yest for same.

## 2022-06-08 NOTE — Discharge Instructions (Addendum)
Your chest x-ray shows an improving process.  We recommend Tylenol and/or ibuprofen for management of symptoms as an outpatient.  Follow-up with a primary doctor.  Return for new or concerning symptoms.

## 2022-06-08 NOTE — ED Provider Notes (Signed)
[facility] EMERGENCY DEPARTMENT Provider Note   CSN: [id number] Arrival date & time: 06/07/22  2151     History  Chief Complaint  Patient presents with   Cough    Aaron Rubio is a 41 y.o. male.  41 year old male presents to the emergency department complaining of generalized bodyaches.  He was diagnosed with the flu 4 days ago.  He has not been taking any medications because he cannot afford it.  He is presently homeless.  He denies any pain shortness of breath, chest pain, vomiting.  The history is provided by the patient. No language interpreter was used.  Cough      Home Medications Prior to Admission medications   Medication Sig Start Date End Date Taking? Authorizing Provider  benzonatate (TESSALON) 100 MG capsule Take 1 capsule (100 mg total) by mouth 3 (three) times daily as needed for cough. 06/04/22   Tegeler, Canary Brim, MD  benztropine (COGENTIN) 2 MG tablet Take 1 tablet (2 mg total) by mouth 2 (two) times daily. 07/15/21 08/14/21  Long, Arlyss Repress, MD  cyclobenzaprine (FLEXERIL) 10 MG tablet Take 1 tablet (10 mg total) by mouth 2 (two) times daily as needed for muscle spasms. 06/04/22   Tegeler, Canary Brim, MD  hydrOXYzine (ATARAX) 25 MG tablet Take 1-2 tablets (25-50 mg total) by mouth See admin instructions. Take 25 mg by mouth in the morning and 50 mg at bedtime 07/15/21   Long, Arlyss Repress, MD  metoprolol tartrate (LOPRESSOR) 25 MG tablet Take 1 tablet (25 mg total) by mouth in the morning. 07/15/21 08/14/21  Long, Arlyss Repress, MD  naproxen (NAPROSYN) 375 MG tablet Take 1 tablet (375 mg total) by mouth 2 (two) times daily. 05/11/22   Linwood Dibbles, MD  ondansetron (ZOFRAN) 4 MG tablet Take 1 tablet (4 mg total) by mouth every 8 (eight) hours as needed for nausea or vomiting. 06/04/22   Tegeler, Canary Brim, MD      Allergies    Patient has no known allergies.    Review of Systems   Review of Systems  Respiratory:  Positive for cough.   Ten  systems reviewed and are negative for acute change, except as noted in the HPI.    Physical Exam Updated Vital Signs BP 139/83 (BP Location: Right Arm)   Pulse 64   Temp 97.9 F (36.6 C)   Resp 16   SpO2 95%   Physical Exam Vitals and nursing note reviewed.  Constitutional:      General: He is not in acute distress.    Appearance: He is well-developed. He is not diaphoretic.     Comments: Nontoxic appearing and in NAD  HENT:     Head: Normocephalic and atraumatic.  Eyes:     General: No scleral icterus.    Conjunctiva/sclera: Conjunctivae normal.  Cardiovascular:     Rate and Rhythm: Normal rate and regular rhythm.     Pulses: Normal pulses.  Pulmonary:     Effort: Pulmonary effort is normal. No respiratory distress.     Breath sounds: No stridor. No wheezing or rales.     Comments: Lungs CTAB. Respirations even and unlabored. Musculoskeletal:        General: Normal range of motion.     Cervical back: Normal range of motion.  Skin:    General: Skin is warm and dry.     Coloration: Skin is not pale.     Findings: No erythema or rash.  Neurological:  Mental Status: He is alert and oriented to person, place, and time.     Coordination: Coordination normal.  Psychiatric:        Behavior: Behavior normal.     ED Results / Procedures / Treatments   Labs (all labs ordered are listed, but only abnormal results are displayed) Labs Reviewed - No data to display  EKG None  Radiology DG Chest 2 View  Result Date: 06/07/2022 CLINICAL DATA:  Cough and fever. EXAM: CHEST - 2 VIEW COMPARISON:  06/06/2022. FINDINGS: The heart size and mediastinal contours are within normal limits. Minimal airspace disease is noted in the medial aspect of the right lung base, improved from the prior exam. The left lung is clear. No effusion or pneumothorax. No acute osseous abnormality. IMPRESSION: Mild airspace disease at the right lung base, slightly improved from the prior exam.  Electronically Signed   By: Thornell Sartorius M.D.   On: 06/07/2022 22:28   DG Chest 2 View  Result Date: 06/06/2022 CLINICAL DATA:  Cough and fever. Body aches. EXAM: CHEST - 2 VIEW COMPARISON:  05/11/2022 FINDINGS: The cardiomediastinal contours are normal. Minimal patchy right infrahilar airspace disease. Pulmonary vasculature is normal. No pleural effusion or pneumothorax. No acute osseous abnormalities are seen. IMPRESSION: Minimal patchy right infrahilar airspace disease, atelectasis versus pneumonia. Electronically Signed   By: Narda Rutherford M.D.   On: 06/06/2022 22:07    Procedures Procedures    Medications Ordered in ED Medications  ibuprofen (ADVIL) tablet 800 mg (has no administration in time range)    ED Course/ Medical Decision Making/ A&P                           Medical Decision Making Risk Prescription drug management.   This patient presents to the ED for concern of body aches, this involves an extensive number of treatment options, and is a complaint that carries with it a high risk of complications and morbidity.  The differential diagnosis includes flu-like illness vs rhabdomyolysis vs muscle strain   Co morbidities that complicate the patient evaluation  HTN Schizophrenia   Additional history obtained:  External records from outside source obtained and reviewed including positive influenza test on 06/04/22   Imaging Studies ordered:  I ordered imaging studies including CXR  I independently visualized and interpreted imaging which showed improved appearance of respiratory illness compared to 48 hours prior. I agree with the radiologist interpretation   Cardiac Monitoring:  The patient was maintained on a cardiac monitor.  I personally viewed and interpreted the cardiac monitored which showed an underlying rhythm of: NSR   Medicines ordered and prescription drug management:  I ordered medication including ibuprofen for body aches  Reevaluation of  the patient after these medicines showed that the patient stayed the same I have reviewed the patients home medicines and have made adjustments as needed   Test Considered:  CK   Reevaluation:  After the interventions noted above, I reevaluated the patient and found that they have :stayed the same   Social Determinants of Health:  Homelessness    Dispostion:  After consideration of the diagnostic results and the patients response to treatment, I feel that the patent would benefit from continued outpatient supportive measures. Encouraged ibuprofen or APAP for fever, body aches. Return precautions discussed and provided. Patient discharged in stable condition with no unaddressed concerns.          Final Clinical Impression(s) / ED Diagnoses Final  diagnoses:  Influenza    Rx / DC Orders ED Discharge Orders     None         Antony Madura, PA-C 06/08/22 4098    Marily Memos, MD 06/08/22 [phone]

## 2022-06-08 NOTE — ED Notes (Signed)
Patient discharged however upset that we don't have any [place] sandwiches in the department at present.

## 2022-06-15 ENCOUNTER — Other Ambulatory Visit: Payer: Self-pay

## 2022-06-15 ENCOUNTER — Emergency Department (HOSPITAL_COMMUNITY)
Admission: EM | Admit: 2022-06-15 | Discharge: 2022-06-15 | Disposition: A | Discharge status: Home / Self Care | Payer: Medicaid Other | Attending: Emergency Medicine | Admitting: Emergency Medicine

## 2022-06-15 ENCOUNTER — Emergency Department (HOSPITAL_COMMUNITY): Payer: Medicaid Other

## 2022-06-15 DIAGNOSIS — J45909 Unspecified asthma, uncomplicated: Secondary | ICD-10-CM | POA: Insufficient documentation

## 2022-06-15 DIAGNOSIS — R0602 Shortness of breath: Secondary | ICD-10-CM | POA: Diagnosis present

## 2022-06-15 DIAGNOSIS — Z5900 Homelessness unspecified: Secondary | ICD-10-CM | POA: Diagnosis not present

## 2022-06-15 DIAGNOSIS — J181 Lobar pneumonia, unspecified organism: Secondary | ICD-10-CM | POA: Diagnosis not present

## 2022-06-15 DIAGNOSIS — J189 Pneumonia, unspecified organism: Secondary | ICD-10-CM

## 2022-06-15 LAB — CBC WITH DIFFERENTIAL/PLATELET
Abs Immature Granulocytes: 0.06 10*3/uL (ref 0.00–0.07)
Basophils Absolute: 0 10*3/uL (ref 0.0–0.1)
Basophils Relative: 1 %
Eosinophils Absolute: 0.5 10*3/uL (ref 0.0–0.5)
Eosinophils Relative: 9 %
HCT: 38.1 % — ABNORMAL LOW (ref 39.0–52.0)
Hemoglobin: 12.1 g/dL — ABNORMAL LOW (ref 13.0–17.0)
Immature Granulocytes: 1 %
Lymphocytes Relative: 20 %
Lymphs Abs: 1.2 10*3/uL (ref 0.7–4.0)
MCH: 26.2 pg (ref 26.0–34.0)
MCHC: 31.8 g/dL (ref 30.0–36.0)
MCV: 82.6 fL (ref 80.0–100.0)
Monocytes Absolute: 0.8 10*3/uL (ref 0.1–1.0)
Monocytes Relative: 14 %
Neutro Abs: 3.2 10*3/uL (ref 1.7–7.7)
Neutrophils Relative %: 55 %
Platelets: 263 10*3/uL (ref 150–400)
RBC: 4.61 MIL/uL (ref 4.22–5.81)
RDW: 12.8 % (ref 11.5–15.5)
WBC: 5.9 10*3/uL (ref 4.0–10.5)
nRBC: 0 % (ref 0.0–0.2)

## 2022-06-15 LAB — BASIC METABOLIC PANEL
Anion gap: 5 (ref 5–15)
BUN: 9 mg/dL (ref 6–20)
CO2: 29 mmol/L (ref 22–32)
Calcium: 8.7 mg/dL — ABNORMAL LOW (ref 8.9–10.3)
Chloride: 106 mmol/L (ref 98–111)
Creatinine, Ser: 1.05 mg/dL (ref 0.61–1.24)
GFR, Estimated: >60 mL/min (ref >60)
Glucose, Bld: 83 mg/dL (ref 70–99)
Potassium: 3.3 mmol/L — ABNORMAL LOW (ref 3.5–5.1)
Sodium: 140 mmol/L (ref 135–145)

## 2022-06-15 LAB — TROPONIN I (HIGH SENSITIVITY)
Troponin I (High Sensitivity): 7 ng/L (ref <18)
Troponin I (High Sensitivity): 5 ng/L (ref <18)

## 2022-06-15 MED ORDER — POTASSIUM CHLORIDE CRYS ER 20 MEQ PO TBCR
40.0000 meq | EXTENDED_RELEASE_TABLET | Freq: Once | ORAL | Status: AC
  Administered 2022-06-15: 40 meq via ORAL
  Filled 2022-06-15: qty 2

## 2022-06-15 MED ORDER — AZITHROMYCIN 250 MG PO TABS
500.0000 mg | ORAL_TABLET | Freq: Once | ORAL | Status: AC
  Administered 2022-06-15: 500 mg via ORAL
  Filled 2022-06-15: qty 2

## 2022-06-15 MED ORDER — AZITHROMYCIN 250 MG PO TABS: 250.0000 mg | ORAL_TABLET | Freq: Every day | ORAL | 0 refills | Status: DC

## 2022-06-15 MED ORDER — ONDANSETRON 4 MG PO TBDP: 4.0000 mg | ORAL_TABLET | Freq: Three times a day (TID) | ORAL | 0 refills | Status: DC | PRN

## 2022-06-15 NOTE — ED Provider Notes (Signed)
[facility] EMERGENCY DEPARTMENT Provider Note   CSN: [id number] Arrival date & time: 06/15/22  0008     History  Chief Complaint  Patient presents with   Influenza    Aaron Rubio is a 41 y.o. male.  HPI 41 year old male who is homeless presents with cough and shortness of breath.  He states that he has flu and pneumonia.  His timeframe is a little odd as he states this all has been going on for the past couple days but then when I discussed his most recent ED evaluations he admits that he has been dealing with the flu for several weeks but feels like all of his symptoms are worse over the last couple days.  He is having cough, shortness of breath, on and off chest pain, vomiting, chills and numbness in fingers and toes.  He states that being outside of the hospital seems to be making all of his symptoms worse.  He denies any significant past medical history though he states he thinks he had asthma as a child.  Home Medications Prior to Admission medications   Medication Sig Start Date End Date Taking? Authorizing Provider  azithromycin (ZITHROMAX) 250 MG tablet Take 1 tablet (250 mg total) by mouth daily. 06/16/22  Yes Pricilla Loveless, MD  ondansetron (ZOFRAN-ODT) 4 MG disintegrating tablet Take 1 tablet (4 mg total) by mouth every 8 (eight) hours as needed for nausea or vomiting. 06/15/22  Yes Pricilla Loveless, MD  benzonatate (TESSALON) 100 MG capsule Take 1 capsule (100 mg total) by mouth 3 (three) times daily as needed for cough. 06/04/22   Tegeler, Canary Brim, MD  benztropine (COGENTIN) 2 MG tablet Take 1 tablet (2 mg total) by mouth 2 (two) times daily. 07/15/21 08/14/21  Long, Arlyss Repress, MD  cyclobenzaprine (FLEXERIL) 10 MG tablet Take 1 tablet (10 mg total) by mouth 2 (two) times daily as needed for muscle spasms. 06/04/22   Tegeler, Canary Brim, MD  hydrOXYzine (ATARAX) 25 MG tablet Take 1-2 tablets (25-50 mg total) by mouth See admin instructions. Take  25 mg by mouth in the morning and 50 mg at bedtime 07/15/21   Long, Arlyss Repress, MD  metoprolol tartrate (LOPRESSOR) 25 MG tablet Take 1 tablet (25 mg total) by mouth in the morning. 07/15/21 08/14/21  Long, Arlyss Repress, MD  naproxen (NAPROSYN) 375 MG tablet Take 1 tablet (375 mg total) by mouth 2 (two) times daily. 05/11/22   Linwood Dibbles, MD      Allergies    Patient has no known allergies.    Review of Systems   Review of Systems  Constitutional:  Positive for chills and fever.  HENT:  Positive for rhinorrhea.   Respiratory:  Positive for cough and shortness of breath.   Cardiovascular:  Positive for chest pain.  Gastrointestinal:  Positive for vomiting. Negative for abdominal pain.    Physical Exam Updated Vital Signs BP (!) 142/98 (BP Location: Left Arm)   Pulse 64   Temp 98.3 F (36.8 C)   Resp 18   SpO2 100%  Physical Exam Vitals and nursing note reviewed.  Constitutional:      General: He is not in acute distress.    Appearance: He is well-developed. He is not ill-appearing or diaphoretic.  HENT:     Head: Normocephalic and atraumatic.  Cardiovascular:     Rate and Rhythm: Normal rate and regular rhythm.     Heart sounds: Normal heart sounds.  Pulmonary:  Effort: Pulmonary effort is normal.     Breath sounds: Normal breath sounds. No wheezing.  Abdominal:     General: There is no distension.     Palpations: Abdomen is soft.     Tenderness: There is no abdominal tenderness.  Skin:    General: Skin is warm and dry.  Neurological:     Mental Status: He is alert.     ED Results / Procedures / Treatments   Labs (all labs ordered are listed, but only abnormal results are displayed) Labs Reviewed  BASIC METABOLIC PANEL - Abnormal; Notable for the following components:      Result Value   Potassium 3.3 (*)    Calcium 8.7 (*)    All other components within normal limits  CBC WITH DIFFERENTIAL/PLATELET - Abnormal; Notable for the following components:   Hemoglobin 12.1  (*)    HCT 38.1 (*)    All other components within normal limits  TROPONIN I (HIGH SENSITIVITY)  TROPONIN I (HIGH SENSITIVITY)    EKG EKG Interpretation  Date/Time:  Thursday June 15 2022 00:02:42 EST Ventricular Rate:  66 PR Interval:  152 QRS Duration: 86 QT Interval:  436 QTC Calculation: 457 R Axis:   28 Text Interpretation: Normal sinus rhythm with sinus arrhythmia Nonspecific T wave abnormality  nonspecific T waves similar to Jun 06 2022 Confirmed by Pricilla Loveless [phone]) on 06/15/2022 8:28:29 AM  Radiology DG Chest 2 View  Result Date: 06/15/2022 CLINICAL DATA:  Cough pain EXAM: CHEST - 2 VIEW COMPARISON:  06/07/2022 FINDINGS: Improving right infrahilar airspace disease. No new or progressive consolidation. Normal heart size and mediastinal contours. No pneumothorax or pleural effusion. Stable osseous structures. IMPRESSION: Improving right infrahilar airspace disease. No new abnormality. Electronically Signed   By: Narda Rutherford M.D.   On: 06/15/2022 01:24    Procedures Procedures    Medications Ordered in ED Medications  potassium chloride SA (KLOR-CON M) CR tablet 40 mEq (has no administration in time range)  azithromycin (ZITHROMAX) tablet 500 mg (has no administration in time range)    ED Course/ Medical Decision Making/ A&P                           Medical Decision Making Amount and/or Complexity of Data Reviewed External Data Reviewed: notes. Labs:     Details: Labs show no leukocytosis.  Chronic anemia.  Mild hypokalemia.  Normal troponins. Radiology: independent interpretation performed.    Details: Subtle right middle lobe infiltrate, similar or improved compared to prior. ECG/medicine tests: independent interpretation performed.    Details: No acute ischemia.  Risk Prescription drug management. Diagnosis or treatment significantly limited by social determinants of health.   Patient presents with concern for pneumonia/flu.  Chart review  shows he tested positive for the flu at the beginning of this month, almost 2 weeks ago.  He has been in and out of the ER ever since.  He is hemodynamically stable here, has a benign abdominal exam and is not hypoxic.  He questionably has pneumonia on top of his previous influenza.  However typically that would cause him to be quite ill which she does not appear to be.  However think is reasonable with a subtle infiltrate on the x-ray to treat with a short course of antibiotics.  Will treat with azithromycin and will give ondansetron prescription.  He is asking for food, will be given food and discharged.  Otherwise, will give homeless shelter resources  and advise of return precautions.        Final Clinical Impression(s) / ED Diagnoses Final diagnoses:  Community acquired pneumonia of right middle lobe of lung    Rx / DC Orders ED Discharge Orders          Ordered    azithromycin (ZITHROMAX) 250 MG tablet  Daily        06/15/22 0846    ondansetron (ZOFRAN-ODT) 4 MG disintegrating tablet  Every 8 hours PRN        06/15/22 0846              Pricilla Loveless, MD 06/15/22 [phone]

## 2022-06-15 NOTE — ED Triage Notes (Signed)
Pt dx with flu 2 days ago. Px cough meds but did not get it filled. Reports SOB.

## 2022-06-15 NOTE — ED Provider Triage Note (Signed)
Emergency Medicine Provider Triage Evaluation Note  Rey Dansby , a 41 y.o. male  was evaluated in triage.  Pt complains of URI-like symptoms, going on for about a week's time, states he was diagnosed with the flu last week and his symptoms have not resolved, he states he has productive sputum, states he will chest pain, states he feels slightly short of breath, no leg swelling, no cardiac history no history PEs or DVTs currently on hormone therapy..  Review of Systems  Positive: Cough, chest pain Negative: Abdominal pain nausea vomiting  Physical Exam  BP (!) 140/85 (BP Location: Right Arm)   Pulse 61   Temp 98.3 F (36.8 C)   Resp 19   SpO2 100%  Gen:   Awake, no distress   Resp:  Normal effort  MSK:   Moves extremities without difficulty  Other:    Medical Decision Making  Medically screening exam initiated at 12:56 AM.  Appropriate orders placed.  Robben Jagiello was informed that the remainder of the evaluation will be completed by another provider, this initial triage assessment does not replace that evaluation, and the importance of remaining in the ED until their evaluation is complete.  Lab or imaging have been ordered will need further workup.   Carroll Sage, PA-C 06/15/22 [phone]

## 2022-06-15 NOTE — Discharge Instructions (Addendum)
You were given your first dose of antibiotics here in the emergency department.  Start your antibiotic prescription (azithromycin) tomorrow, 12/15.  If you develop high fever, uncontrolled vomiting, coughing up blood, or any other new/concerning symptoms then return to the ER for evaluation.

## 2022-06-17 ENCOUNTER — Emergency Department (HOSPITAL_COMMUNITY)
Admission: EM | Admit: 2022-06-17 | Discharge: 2022-06-17 | Disposition: A | Discharge status: Home / Self Care | Payer: Medicaid Other | Attending: Emergency Medicine | Admitting: Emergency Medicine

## 2022-06-17 ENCOUNTER — Other Ambulatory Visit: Payer: Self-pay

## 2022-06-17 DIAGNOSIS — Z5900 Homelessness unspecified: Secondary | ICD-10-CM | POA: Diagnosis not present

## 2022-06-17 DIAGNOSIS — Z765 Malingerer [conscious simulation]: Secondary | ICD-10-CM | POA: Insufficient documentation

## 2022-06-17 DIAGNOSIS — X31XXXA Exposure to excessive natural cold, initial encounter: Secondary | ICD-10-CM | POA: Diagnosis not present

## 2022-06-17 NOTE — ED Triage Notes (Signed)
Patient arrived with EMS from street , homeless , no complaints , reports needs a place to stay /exposed to cold weather this evening .

## 2022-06-17 NOTE — Discharge Instructions (Signed)
Please use the resource guide for shelters that I printed for you it is attached to the back of this packet

## 2022-06-17 NOTE — ED Provider Notes (Signed)
[facility] EMERGENCY DEPARTMENT Provider Note   CSN: [id number] Arrival date & time: 06/17/22  0301     History  Chief Complaint  Patient presents with   Cold Exposure ( Homeless)     Aaron Rubio is a 41 y.o. male.  HPI  Patient is a 41 year old male presenting emergency room today with complaints of feeling cold.  He denies any pain lightheadedness dizziness shortness of breath cough congestion or fevers.  No other associate symptoms.     Home Medications Prior to Admission medications   Medication Sig Start Date End Date Taking? Authorizing Provider  azithromycin (ZITHROMAX) 250 MG tablet Take 1 tablet (250 mg total) by mouth daily. 06/16/22   Pricilla Loveless, MD  benzonatate (TESSALON) 100 MG capsule Take 1 capsule (100 mg total) by mouth 3 (three) times daily as needed for cough. 06/04/22   Tegeler, Canary Brim, MD  benztropine (COGENTIN) 2 MG tablet Take 1 tablet (2 mg total) by mouth 2 (two) times daily. 07/15/21 08/14/21  Long, Arlyss Repress, MD  cyclobenzaprine (FLEXERIL) 10 MG tablet Take 1 tablet (10 mg total) by mouth 2 (two) times daily as needed for muscle spasms. 06/04/22   Tegeler, Canary Brim, MD  hydrOXYzine (ATARAX) 25 MG tablet Take 1-2 tablets (25-50 mg total) by mouth See admin instructions. Take 25 mg by mouth in the morning and 50 mg at bedtime 07/15/21   Long, Arlyss Repress, MD  metoprolol tartrate (LOPRESSOR) 25 MG tablet Take 1 tablet (25 mg total) by mouth in the morning. 07/15/21 08/14/21  Long, Arlyss Repress, MD  naproxen (NAPROSYN) 375 MG tablet Take 1 tablet (375 mg total) by mouth 2 (two) times daily. 05/11/22   Linwood Dibbles, MD  ondansetron (ZOFRAN-ODT) 4 MG disintegrating tablet Take 1 tablet (4 mg total) by mouth every 8 (eight) hours as needed for nausea or vomiting. 06/15/22   Pricilla Loveless, MD      Allergies    Patient has no known allergies.    Review of Systems   Review of Systems  Physical Exam Updated Vital Signs BP  139/88   Pulse 84   Temp 98 F (36.7 C)   Resp 17   SpO2 98%  Physical Exam Vitals and nursing note reviewed.  Constitutional:      General: He is not in acute distress. HENT:     Head: Normocephalic and atraumatic.     Nose: Nose normal.  Eyes:     General: No scleral icterus. Cardiovascular:     Rate and Rhythm: Normal rate and regular rhythm.     Pulses: Normal pulses.     Heart sounds: Normal heart sounds.  Pulmonary:     Effort: Pulmonary effort is normal. No respiratory distress.     Breath sounds: No wheezing.  Abdominal:     Palpations: Abdomen is soft.     Tenderness: There is no abdominal tenderness.     Comments: NTTP   Musculoskeletal:     Cervical back: Normal range of motion.     Right lower leg: No edema.     Left lower leg: No edema.  Skin:    General: Skin is warm and dry.     Capillary Refill: Capillary refill takes less than 2 seconds.     Comments: Skin is warm, cap refill less than 2 seconds  Neurological:     Mental Status: He is alert. Mental status is at baseline.  Psychiatric:  Mood and Affect: Mood normal.        Behavior: Behavior normal.     ED Results / Procedures / Treatments   Labs (all labs ordered are listed, but only abnormal results are displayed) Labs Reviewed - No data to display  EKG None  Radiology No results found.  Procedures Procedures    Medications Ordered in ED Medications - No data to display  ED Course/ Medical Decision Making/ A&P                           Medical Decision Making  Patient is a 41 year old male presenting emergency room today with complaints of feeling cold.  He denies any pain lightheadedness dizziness shortness of breath cough congestion or fevers.  No other associate symptoms.  Per my chart review patient has been printed the shelter resource guide in the past it seems that most recently he was provided since 12/6  Patient is alert and oriented.  He is nontoxic-appearing  has warm skin and is moving all extremities well.  Oral mucosa is moist.  I printed patient a copy of the shelter resource list.  Will discharge patient home.   He notably was requesting I call a PCP he has seen to ask him to come to the ER to see him. Upon informing him that he will be discharged he began endorsing hypothermia as well as saying that he had a seizure earlier.  I reviewed patient's past ER visits and seems that he comes to the ER regularly.  He has been acutely psychotic at times which she does not appear to be experiencing at this time.  He also has numerous visits for homelessness and also for malingering which is consistent w his presentation at this time. Return precautions discussed. DC from ED.   Final Clinical Impression(s) / ED Diagnoses Final diagnoses:  Malingering    Rx / DC Orders ED Discharge Orders     None         Gailen Shelter, [place] 06/17/22 0536    Palumbo, April, MD 06/17/22 [phone]

## 2022-06-18 ENCOUNTER — Emergency Department (HOSPITAL_COMMUNITY)
Admission: EM | Admit: 2022-06-18 | Discharge: 2022-06-18 | Disposition: A | Discharge status: Home / Self Care | Payer: Medicaid Other | Attending: Emergency Medicine | Admitting: Emergency Medicine

## 2022-06-18 ENCOUNTER — Other Ambulatory Visit: Payer: Self-pay

## 2022-06-18 DIAGNOSIS — Z5900 Homelessness unspecified: Secondary | ICD-10-CM | POA: Insufficient documentation

## 2022-06-18 DIAGNOSIS — I10 Essential (primary) hypertension: Secondary | ICD-10-CM | POA: Insufficient documentation

## 2022-06-18 DIAGNOSIS — Z79899 Other long term (current) drug therapy: Secondary | ICD-10-CM | POA: Insufficient documentation

## 2022-06-18 DIAGNOSIS — R519 Headache, unspecified: Secondary | ICD-10-CM | POA: Insufficient documentation

## 2022-06-18 DIAGNOSIS — Z765 Malingerer [conscious simulation]: Secondary | ICD-10-CM | POA: Diagnosis present

## 2022-06-18 MED ORDER — ACETAMINOPHEN 500 MG PO TABS
1000.0000 mg | ORAL_TABLET | ORAL | Status: AC
  Administered 2022-06-18: 1000 mg via ORAL
  Filled 2022-06-18: qty 2

## 2022-06-18 NOTE — ED Notes (Signed)
Patient arrived with EMS from street requesting a place to stay due to cold weather .

## 2022-06-18 NOTE — ED Provider Notes (Signed)
[facility] EMERGENCY DEPARTMENT Provider Note   CSN: [id number] Arrival date & time: 06/18/22  0122     History  Chief Complaint  Patient presents with   Cold Exposure    Aaron Rubio is a 41 y.o. male.  HPI  Patient is a 41 year old male history of schizophrenia hypertension homelessness and malingering  He has presented to the emergency room today states he has no pain but that he is cold he is currently living outside because he is homeless.  I have provided him the shelter resource handout yesterday.  He is not particularly forthcoming about why he is resistant to staying in a shelter.    Home Medications Prior to Admission medications   Medication Sig Start Date End Date Taking? Authorizing Provider  azithromycin (ZITHROMAX) 250 MG tablet Take 1 tablet (250 mg total) by mouth daily. 06/16/22   Pricilla Loveless, MD  benzonatate (TESSALON) 100 MG capsule Take 1 capsule (100 mg total) by mouth 3 (three) times daily as needed for cough. 06/04/22   Tegeler, Canary Brim, MD  benztropine (COGENTIN) 2 MG tablet Take 1 tablet (2 mg total) by mouth 2 (two) times daily. 07/15/21 08/14/21  Long, Arlyss Repress, MD  cyclobenzaprine (FLEXERIL) 10 MG tablet Take 1 tablet (10 mg total) by mouth 2 (two) times daily as needed for muscle spasms. 06/04/22   Tegeler, Canary Brim, MD  hydrOXYzine (ATARAX) 25 MG tablet Take 1-2 tablets (25-50 mg total) by mouth See admin instructions. Take 25 mg by mouth in the morning and 50 mg at bedtime 07/15/21   Long, Arlyss Repress, MD  metoprolol tartrate (LOPRESSOR) 25 MG tablet Take 1 tablet (25 mg total) by mouth in the morning. 07/15/21 08/14/21  Long, Arlyss Repress, MD  naproxen (NAPROSYN) 375 MG tablet Take 1 tablet (375 mg total) by mouth 2 (two) times daily. 05/11/22   Linwood Dibbles, MD  ondansetron (ZOFRAN-ODT) 4 MG disintegrating tablet Take 1 tablet (4 mg total) by mouth every 8 (eight) hours as needed for nausea or vomiting. 06/15/22    Pricilla Loveless, MD      Allergies    Patient has no known allergies.    Review of Systems   Review of Systems  Physical Exam Updated Vital Signs BP (!) 142/109   Pulse 90   Temp 98.5 F (36.9 C)   Resp 16   SpO2 100%  Physical Exam Vitals and nursing note reviewed.  Constitutional:      General: He is not in acute distress. HENT:     Head: Normocephalic and atraumatic.     Nose: Nose normal.     Mouth/Throat:     Mouth: Mucous membranes are moist.  Eyes:     General: No scleral icterus. Cardiovascular:     Rate and Rhythm: Normal rate and regular rhythm.     Pulses: Normal pulses.     Heart sounds: Normal heart sounds.  Pulmonary:     Effort: Pulmonary effort is normal.  Abdominal:     Palpations: Abdomen is soft.     Tenderness: There is no abdominal tenderness.  Musculoskeletal:     Cervical back: Normal range of motion.     Right lower leg: No edema.     Left lower leg: No edema.  Skin:    General: Skin is warm and dry.     Capillary Refill: Capillary refill takes less than 2 seconds.  Neurological:     Mental Status: He is alert. Mental status  is at baseline.  Psychiatric:        Mood and Affect: Mood normal.        Behavior: Behavior normal.     Comments: Pleasant, euthymic, not erratic behavior     ED Results / Procedures / Treatments   Labs (all labs ordered are listed, but only abnormal results are displayed) Labs Reviewed - No data to display  EKG None  Radiology No results found.  Procedures Procedures    Medications Ordered in ED Medications  acetaminophen (TYLENOL) tablet 1,000 mg (1,000 mg Oral Given 06/18/22 0215)    ED Course/ Medical Decision Making/ A&P                           Medical Decision Making Risk OTC drugs.  Patient is a 41 year old male history of schizophrenia hypertension homelessness and malingering  He has presented to the emergency room today states he has no pain but that he is cold he is currently  living outside because he is homeless.  I have provided him the shelter resource handout yesterday.  He is not particularly forthcoming about why he is resistant to staying in a shelter.   On further questioning patient states that he does have a mild headache we will provide him with 1 g of Tylenol, [place] sandwich, something to drink and discharged to the waiting room.  He is he understands that he will need to leave the waiting room in the morning.     Final Clinical Impression(s) / ED Diagnoses Final diagnoses:  Malingering    Rx / DC Orders ED Discharge Orders     None         Gailen Shelter, [place] 06/18/22 0233    Melene Plan, DO 06/18/22 [phone]

## 2022-06-19 ENCOUNTER — Emergency Department (HOSPITAL_COMMUNITY)
Admission: EM | Admit: 2022-06-19 | Discharge: 2022-06-20 | Disposition: A | Discharge status: Home / Self Care | Payer: Medicaid Other | Attending: Emergency Medicine | Admitting: Emergency Medicine

## 2022-06-19 DIAGNOSIS — M25552 Pain in left hip: Secondary | ICD-10-CM | POA: Insufficient documentation

## 2022-06-19 DIAGNOSIS — Z79899 Other long term (current) drug therapy: Secondary | ICD-10-CM | POA: Diagnosis not present

## 2022-06-19 DIAGNOSIS — X501XXA Overexertion from prolonged static or awkward postures, initial encounter: Secondary | ICD-10-CM | POA: Insufficient documentation

## 2022-06-19 DIAGNOSIS — Y9301 Activity, walking, marching and hiking: Secondary | ICD-10-CM | POA: Insufficient documentation

## 2022-06-19 DIAGNOSIS — I10 Essential (primary) hypertension: Secondary | ICD-10-CM | POA: Insufficient documentation

## 2022-06-19 NOTE — ED Triage Notes (Signed)
Pt to ED reports was "kicked out" this morning from the ED, homeless, reports exposed to cold weather. Argumentative in triage.

## 2022-06-20 ENCOUNTER — Encounter (HOSPITAL_COMMUNITY): Payer: Self-pay

## 2022-06-20 ENCOUNTER — Emergency Department (HOSPITAL_COMMUNITY)
Admission: EM | Admit: 2022-06-20 | Discharge: 2022-06-20 | Disposition: A | Discharge status: Home / Self Care | Payer: Medicaid Other | Source: Home / Self Care

## 2022-06-20 ENCOUNTER — Other Ambulatory Visit: Payer: Self-pay

## 2022-06-20 ENCOUNTER — Emergency Department (HOSPITAL_COMMUNITY)
Admission: EM | Admit: 2022-06-20 | Discharge: 2022-06-20 | Disposition: A | Discharge status: Home / Self Care | Payer: Medicaid Other | Attending: Emergency Medicine | Admitting: Emergency Medicine

## 2022-06-20 DIAGNOSIS — Z0279 Encounter for issue of other medical certificate: Secondary | ICD-10-CM | POA: Insufficient documentation

## 2022-06-20 DIAGNOSIS — Z5900 Homelessness unspecified: Secondary | ICD-10-CM | POA: Diagnosis not present

## 2022-06-20 DIAGNOSIS — Z765 Malingerer [conscious simulation]: Secondary | ICD-10-CM | POA: Insufficient documentation

## 2022-06-20 DIAGNOSIS — M25552 Pain in left hip: Secondary | ICD-10-CM | POA: Insufficient documentation

## 2022-06-20 DIAGNOSIS — M545 Low back pain, unspecified: Secondary | ICD-10-CM | POA: Diagnosis not present

## 2022-06-20 DIAGNOSIS — F209 Schizophrenia, unspecified: Secondary | ICD-10-CM | POA: Insufficient documentation

## 2022-06-20 MED ORDER — NAPROXEN 500 MG PO TABS: 500.0000 mg | ORAL_TABLET | Freq: Two times a day (BID) | ORAL | 0 refills | Status: DC

## 2022-06-20 MED ORDER — NAPROXEN 250 MG PO TABS
500.0000 mg | ORAL_TABLET | Freq: Once | ORAL | Status: AC
  Administered 2022-06-20: 500 mg via ORAL
  Filled 2022-06-20: qty 2

## 2022-06-20 NOTE — Discharge Instructions (Signed)
Seen today for your hip pain.  You are given medications for pain.  Pick up your prescription already have the pharmacy, come back to the ER for new or worsening symptoms.

## 2022-06-20 NOTE — Discharge Instructions (Addendum)
Please take tylenol/ibuprofen for pain. Use resource guide shelters to find a shelter and avoid cold weather. I recommend close follow-up with PCP for reevaluation.  Please do not hesitate to return to emergency department if worrisome signs symptoms we discussed become apparent.

## 2022-06-20 NOTE — ED Notes (Signed)
Patient is screaming out to another patient that was d/c . Pt is currently sitting at this time.

## 2022-06-20 NOTE — ED Provider Notes (Signed)
[facility] EMERGENCY DEPARTMENT Provider Note   CSN: [id number] Arrival date & time: 06/19/22  2005     History  No chief complaint on file.   Aaron Rubio is a 41 y.o. male with a past medical history of hypertension, schizophrenia presenting to the Emergency Department for evaluation of left hip pain.  Patient reports left hip pain started yesterday when he was walking, he twisted his left hip and it has been hurting since.  Patient will recently discharge 2 days ago. States he is homeless and feeling cold outside.  Shelter resources and was given to patient.  HPI    Past Medical History:  Diagnosis Date   Hypertension    Schizophrenia (HCC)    No past surgical history on file.   Home Medications Prior to Admission medications   Medication Sig Start Date End Date Taking? Authorizing Provider  naproxen (NAPROSYN) 500 MG tablet Take 1 tablet (500 mg total) by mouth 2 (two) times daily. 06/20/22  Yes Jeanelle Malling, PA  azithromycin (ZITHROMAX) 250 MG tablet Take 1 tablet (250 mg total) by mouth daily. 06/16/22   Pricilla Loveless, MD  benzonatate (TESSALON) 100 MG capsule Take 1 capsule (100 mg total) by mouth 3 (three) times daily as needed for cough. 06/04/22   Tegeler, Canary Brim, MD  benztropine (COGENTIN) 2 MG tablet Take 1 tablet (2 mg total) by mouth 2 (two) times daily. 07/15/21 08/14/21  Long, Arlyss Repress, MD  cyclobenzaprine (FLEXERIL) 10 MG tablet Take 1 tablet (10 mg total) by mouth 2 (two) times daily as needed for muscle spasms. 06/04/22   Tegeler, Canary Brim, MD  hydrOXYzine (ATARAX) 25 MG tablet Take 1-2 tablets (25-50 mg total) by mouth See admin instructions. Take 25 mg by mouth in the morning and 50 mg at bedtime 07/15/21   Long, Arlyss Repress, MD  metoprolol tartrate (LOPRESSOR) 25 MG tablet Take 1 tablet (25 mg total) by mouth in the morning. 07/15/21 08/14/21  Long, Arlyss Repress, MD  naproxen (NAPROSYN) 375 MG tablet Take 1 tablet (375 mg total) by mouth  2 (two) times daily. 05/11/22   Linwood Dibbles, MD  ondansetron (ZOFRAN-ODT) 4 MG disintegrating tablet Take 1 tablet (4 mg total) by mouth every 8 (eight) hours as needed for nausea or vomiting. 06/15/22   Pricilla Loveless, MD      Allergies    Patient has no known allergies.    Review of Systems   Negative except as per HPI.   Physical Exam Updated Vital Signs BP (!) 136/98 (BP Location: Right Arm)   Pulse 62   Temp 98 F (36.7 C) (Oral)   Resp 16   Ht 6\' 1"  (1.854 m)   Wt 81.2 kg   SpO2 100%   BMI 23.62 kg/m  Physical Exam Vitals and nursing note reviewed.  Constitutional:      Appearance: Normal appearance.  HENT:     Head: Normocephalic and atraumatic.     Mouth/Throat:     Mouth: Mucous membranes are moist.  Eyes:     General: No scleral icterus. Cardiovascular:     Rate and Rhythm: Normal rate and regular rhythm.     Pulses: Normal pulses.     Heart sounds: Normal heart sounds.  Pulmonary:     Effort: Pulmonary effort is normal.     Breath sounds: Normal breath sounds.  Abdominal:     General: Abdomen is flat.     Palpations: Abdomen is soft.  Tenderness: There is no abdominal tenderness.  Musculoskeletal:        General: No deformity.     Comments: TTP to L hip.  Skin:    General: Skin is warm.     Findings: No rash.  Neurological:     General: No focal deficit present.     Mental Status: He is alert.  Psychiatric:        Mood and Affect: Mood normal.     ED Results / Procedures / Treatments   Labs (all labs ordered are listed, but only abnormal results are displayed) Labs Reviewed - No data to display  EKG None  Radiology No results found.  Procedures Procedures    Medications Ordered in ED Medications  naproxen (NAPROSYN) tablet 500 mg (500 mg Oral Given 06/20/22 1216)    ED Course/ Medical Decision Making/ A&P                           Medical Decision Making Risk Prescription drug management.   This patient presents to the  ED for left hip pain, this involves an extensive number of treatment options, and is a complaint that carries with a high risk of complications and morbidity.  The differential diagnosis includes musculoskeletal pain, hip fracture, dislocation.  This is not an exhaustive list.  Comorbidities that complicate the patient evaluation See HPI  Social determinants of health NA  Additional history obtained: External records from outside source obtained and reviewed including: Chart review including previous notes, labs, imaging.  Cardiac monitoring/EKG: The patient was maintained on a cardiac monitor.  I personally reviewed and interpreted the cardiac monitor which showed an underlying rhythm of: Sinus rhythm.  Lab tests:   Imaging studies:   Problem list/ ED course/ Critical interventions/ Medical management: HPI: See above Vital signs within normal range and stable throughout visit. Laboratory/imaging studies significant for: See above. On physical examination, patient is afebrile and appears in no acute distress. Vial sign stable throughout his visit in the ER. Patient complaining of left hip pain which began yesterday when he was walking outside under a bridge. He denies having any fall. There was mild tenderness to palpation to the left hip.  Patient was able to stand and ambulate.  I do not feel imaging studies is needed at this point given patient had no fall or apparent injury. Based on patient's clinical presentations and laboratory/imaging studies I suspect pain.  I ordered naproxen for pain.  Patient has been in and out of the ER frequently recently.  I provided shelter resources document so patient can find a shelter after this visit. I have reviewed the patient home medicines and have made adjustments as needed.  Consultations obtained:  Disposition Continued outpatient therapy. Follow-up with PCP recommended for reevaluation of symptoms. Treatment plan discussed with patient.  Pt  acknowledged understanding was agreeable to the plan. Worrisome signs and symptoms were discussed with patient, and patient acknowledged understanding to return to the ED if they noticed these signs and symptoms. Patient was stable upon discharge.   This chart was dictated using voice recognition software.  Despite best efforts to proofread,  errors can occur which can change the documentation meaning.          Final Clinical Impression(s) / ED Diagnoses Final diagnoses:  Left hip pain    Rx / DC Orders ED Discharge Orders          Ordered    naproxen (  NAPROSYN) 500 MG tablet  2 times daily        06/20/22 1222              Jeanelle Malling, [place] 06/20/22 1350    Mardene Sayer, MD 06/20/22 1538

## 2022-06-20 NOTE — ED Notes (Signed)
Pt A&OX4 ambulatory at d/c with independent steady gait. Pt verbalized understanding of d/c instructions and follow up care. 

## 2022-06-20 NOTE — ED Triage Notes (Signed)
Pt arrives with multiple complaints: L hip popped two days ago while he was walking and is still painful, R great toenail is regrowing after falling off and is causing him pain, and lower back pain.

## 2022-06-20 NOTE — ED Provider Notes (Signed)
[facility] EMERGENCY DEPARTMENT Provider Note   CSN: [id number] Arrival date & time: 06/20/22  1939     History  No chief complaint on file.   Aaron Rubio is a 41 y.o. male who presents emergency department with multiple complaints. The patient has had 26 visits in the last 6 months.  He was seen earlier today and discharged.  He returns today for the second time.  Patient states that he wants a head CT and a chest x-ray.  He states that he needs to be given antibiotics.  He was recently diagnosed with the flu.  He has not had any change in his symptoms.  He wants a sandwich.  HPI     Home Medications Prior to Admission medications   Medication Sig Start Date End Date Taking? Authorizing Provider  azithromycin (ZITHROMAX) 250 MG tablet Take 1 tablet (250 mg total) by mouth daily. 06/16/22   Pricilla Loveless, MD  benzonatate (TESSALON) 100 MG capsule Take 1 capsule (100 mg total) by mouth 3 (three) times daily as needed for cough. 06/04/22   Tegeler, Canary Brim, MD  benztropine (COGENTIN) 2 MG tablet Take 1 tablet (2 mg total) by mouth 2 (two) times daily. 07/15/21 08/14/21  Long, Arlyss Repress, MD  cyclobenzaprine (FLEXERIL) 10 MG tablet Take 1 tablet (10 mg total) by mouth 2 (two) times daily as needed for muscle spasms. 06/04/22   Tegeler, Canary Brim, MD  hydrOXYzine (ATARAX) 25 MG tablet Take 1-2 tablets (25-50 mg total) by mouth See admin instructions. Take 25 mg by mouth in the morning and 50 mg at bedtime 07/15/21   Long, Arlyss Repress, MD  metoprolol tartrate (LOPRESSOR) 25 MG tablet Take 1 tablet (25 mg total) by mouth in the morning. 07/15/21 08/14/21  Long, Arlyss Repress, MD  naproxen (NAPROSYN) 375 MG tablet Take 1 tablet (375 mg total) by mouth 2 (two) times daily. 05/11/22   Linwood Dibbles, MD  naproxen (NAPROSYN) 500 MG tablet Take 1 tablet (500 mg total) by mouth 2 (two) times daily. 06/20/22   Jeanelle Malling, PA  ondansetron (ZOFRAN-ODT) 4 MG disintegrating tablet Take 1  tablet (4 mg total) by mouth every 8 (eight) hours as needed for nausea or vomiting. 06/15/22   Pricilla Loveless, MD      Allergies    Patient has no known allergies.    Review of Systems   Review of Systems  Physical Exam Updated Vital Signs BP (!) 141/90 (BP Location: Right Arm)   Pulse 60   Temp 98.2 F (36.8 C)   Resp 15   SpO2 100%  Physical Exam Vitals and nursing note reviewed.  Constitutional:      General: He is not in acute distress.    Appearance: He is well-developed. He is not diaphoretic.  HENT:     Head: Normocephalic and atraumatic.  Eyes:     General: No scleral icterus.    Conjunctiva/sclera: Conjunctivae normal.  Cardiovascular:     Rate and Rhythm: Normal rate and regular rhythm.     Heart sounds: Normal heart sounds.  Pulmonary:     Effort: Pulmonary effort is normal. No respiratory distress.     Breath sounds: Normal breath sounds.  Abdominal:     Palpations: Abdomen is soft.     Tenderness: There is no abdominal tenderness.  Musculoskeletal:     Cervical back: Normal range of motion and neck supple.  Skin:    General: Skin is warm and dry.  Neurological:  Mental Status: He is alert.  Psychiatric:        Behavior: Behavior normal.     ED Results / Procedures / Treatments   Labs (all labs ordered are listed, but only abnormal results are displayed) Labs Reviewed - No data to display  EKG None  Radiology No results found.  Procedures Procedures    Medications Ordered in ED Medications - No data to display  ED Course/ Medical Decision Making/ A&P                           Medical Decision Making  Patient is slightly disorganized and tangential.  He does not have any complaints that I think we can help him with in the ER.  I advised him that if he is cold he may sit in the lobby and have given him outpatient resources.  Suggested that he purchase over-the-counter DayQuil and NyQuil for his cold symptoms        Final  Clinical Impression(s) / ED Diagnoses Final diagnoses:  Malingering    Rx / DC Orders ED Discharge Orders     None         Arthor Captain, PA-C 06/20/22 2228    Benjiman Core, MD 06/20/22 [phone]

## 2022-06-20 NOTE — Discharge Instructions (Signed)
Please go to the [facility].  They have homeless services and a health clinic as a warming center.

## 2022-06-20 NOTE — ED Triage Notes (Signed)
Patient reports wanting his head scanned and a [place] sandwich. Patient was recently discharged and states he was sent by his Dr.

## 2022-06-20 NOTE — ED Notes (Signed)
Discharge instructions discussed with pt. Verbalized understanding. VSS. No questions or concerns regarding discharge  

## 2022-06-20 NOTE — ED Provider Notes (Signed)
[facility] EMERGENCY DEPARTMENT Provider Note   CSN: [id number] Arrival date & time: 06/20/22  1715     History  Chief Complaint  Patient presents with   Hip Pain   Toe Injury   Back Pain    Aaron Rubio is a 41 y.o. male.  This is a 41 year old male with history of schizophrenia who presents the ED.  He is complaining of left hip pain.  He states a couple days ago it popped when he was walking and has been having some soreness with walking since then.  He was seen yesterday for the same thing.  He states that he has medications to the pharmacy but did not pick them up and wants something here for pain asking for something to eat because he has not had anything to eat today.  He denies fever or chills, denies any trauma to the area.  Reports some mild low back pain.  He denies trauma.  He is able to ambulate without difficulty, he denies saddle anesthesia or paresthesia, no bowel or bladder incontinence.  He has no or chills, and no weight loss.   Hip Pain Pertinent negatives include no chest pain, no abdominal pain and no shortness of breath.  Back Pain Associated symptoms: no abdominal pain, no chest pain, no dysuria and no fever        Home Medications Prior to Admission medications   Medication Sig Start Date End Date Taking? Authorizing Provider  azithromycin (ZITHROMAX) 250 MG tablet Take 1 tablet (250 mg total) by mouth daily. 06/16/22   Pricilla Loveless, MD  benzonatate (TESSALON) 100 MG capsule Take 1 capsule (100 mg total) by mouth 3 (three) times daily as needed for cough. 06/04/22   Tegeler, Canary Brim, MD  benztropine (COGENTIN) 2 MG tablet Take 1 tablet (2 mg total) by mouth 2 (two) times daily. 07/15/21 08/14/21  Long, Arlyss Repress, MD  cyclobenzaprine (FLEXERIL) 10 MG tablet Take 1 tablet (10 mg total) by mouth 2 (two) times daily as needed for muscle spasms. 06/04/22   Tegeler, Canary Brim, MD  hydrOXYzine (ATARAX) 25 MG tablet Take 1-2  tablets (25-50 mg total) by mouth See admin instructions. Take 25 mg by mouth in the morning and 50 mg at bedtime 07/15/21   Long, Arlyss Repress, MD  metoprolol tartrate (LOPRESSOR) 25 MG tablet Take 1 tablet (25 mg total) by mouth in the morning. 07/15/21 08/14/21  Long, Arlyss Repress, MD  naproxen (NAPROSYN) 375 MG tablet Take 1 tablet (375 mg total) by mouth 2 (two) times daily. 05/11/22   Linwood Dibbles, MD  naproxen (NAPROSYN) 500 MG tablet Take 1 tablet (500 mg total) by mouth 2 (two) times daily. 06/20/22   Jeanelle Malling, PA  ondansetron (ZOFRAN-ODT) 4 MG disintegrating tablet Take 1 tablet (4 mg total) by mouth every 8 (eight) hours as needed for nausea or vomiting. 06/15/22   Pricilla Loveless, MD      Allergies    Patient has no known allergies.    Review of Systems   Review of Systems  Constitutional:  Negative for chills and fever.  HENT:  Negative for ear pain and sore throat.   Eyes:  Negative for pain and visual disturbance.  Respiratory:  Negative for cough and shortness of breath.   Cardiovascular:  Negative for chest pain and palpitations.  Gastrointestinal:  Negative for abdominal pain and vomiting.  Genitourinary:  Negative for dysuria and hematuria.  Musculoskeletal:  Positive for back pain. Negative for  arthralgias.  Skin:  Negative for color change and rash.  Neurological:  Negative for seizures and syncope.  All other systems reviewed and are negative.   Physical Exam Updated Vital Signs BP (!) 162/103 (BP Location: Right Arm)   Pulse 74   Temp 98.3 F (36.8 C) (Oral)   Resp 16   SpO2 100%  Physical Exam Vitals and nursing note reviewed.  Constitutional:      General: He is not in acute distress.    Appearance: He is well-developed.  HENT:     Head: Normocephalic and atraumatic.  Eyes:     Conjunctiva/sclera: Conjunctivae normal.  Cardiovascular:     Rate and Rhythm: Normal rate and regular rhythm.     Heart sounds: No murmur heard. Pulmonary:     Effort: Pulmonary effort  is normal. No respiratory distress.     Breath sounds: Normal breath sounds.  Abdominal:     Palpations: Abdomen is soft.     Tenderness: There is no abdominal tenderness.  Musculoskeletal:        General: No swelling.     Cervical back: Neck supple.     Comments: No pain or swelling on palpation to the left hip.  Patient can fully flex and extend internally and externally rotate without difficulty but states he does have some mild pain with flexion.  No tenderness of knee or ankle.  DP and PT pulses intact, sensation intact. Number spine is nontender with no swelling.  Normal range of motion with mild pain on flexion  Skin:    General: Skin is warm and dry.     Capillary Refill: Capillary refill takes less than 2 seconds.  Neurological:     General: No focal deficit present.     Mental Status: He is alert.     Sensory: No sensory deficit.     Motor: No weakness.     Coordination: Coordination normal.     Gait: Gait normal.  Psychiatric:        Mood and Affect: Mood normal.     ED Results / Procedures / Treatments   Labs (all labs ordered are listed, but only abnormal results are displayed) Labs Reviewed - No data to display  EKG None  Radiology No results found.  Procedures Procedures    Medications Ordered in ED Medications  naproxen (NAPROSYN) tablet 500 mg (has no administration in time range)    ED Course/ Medical Decision Making/ A&P                           Medical Decision Making This patient presents to the ED for concern of left hip pain, low back pain, this involves an extensive number of treatment options, and is a complaint that carries with it a high risk of complications and morbidity.  The differential diagnosis includes sprain, strain, fracture, HNP, other   Co morbidities that complicate the patient evaluation  Schizophrenia  Cardiac Monitoring: / EKG:    Problem List / ED Course / Critical interventions / Medication management  Left  hip pain and low back pain, no trauma, seen yesterday for the same.  He had no imaging yesterday I ordered medication including Naprosyn for pain.  He was given food as requested.  He was seen this week also due to being cold and homeless, he was given homeless shelter resources at that time.  Reevaluation of the patient after these medicines showed that the patient stayed  the same I have reviewed the patients home medicines and have made adjustments as needed   Social Determinants of Health: Patient is homeless   Test / Admission - Considered:  Considered imaging as patient was here yesterday and did not have an x-ray though he had no new trauma or trauma initially at all.  He is able to ambulate, there is no sign of infection, he came in today only because he did not get his medications to the pharmacy and is requesting a dose of pain medication and something to eat.    Risk Prescription drug management.           Final Clinical Impression(s) / ED Diagnoses Final diagnoses:  Pain of left hip    Rx / DC Orders ED Discharge Orders     None         Josem Kaufmann 06/20/22 1740    Benjiman Core, MD 06/20/22 [phone]

## 2022-10-21 IMAGING — CR DG FOOT COMPLETE 3+V*R*
3 series · 3 of 3 positions shown · non-contrast
Comparison: None.

CLINICAL DATA: Bilateral foot pain

EXAM:
RIGHT FOOT COMPLETE - 3+ VIEW

[x foot ap right]
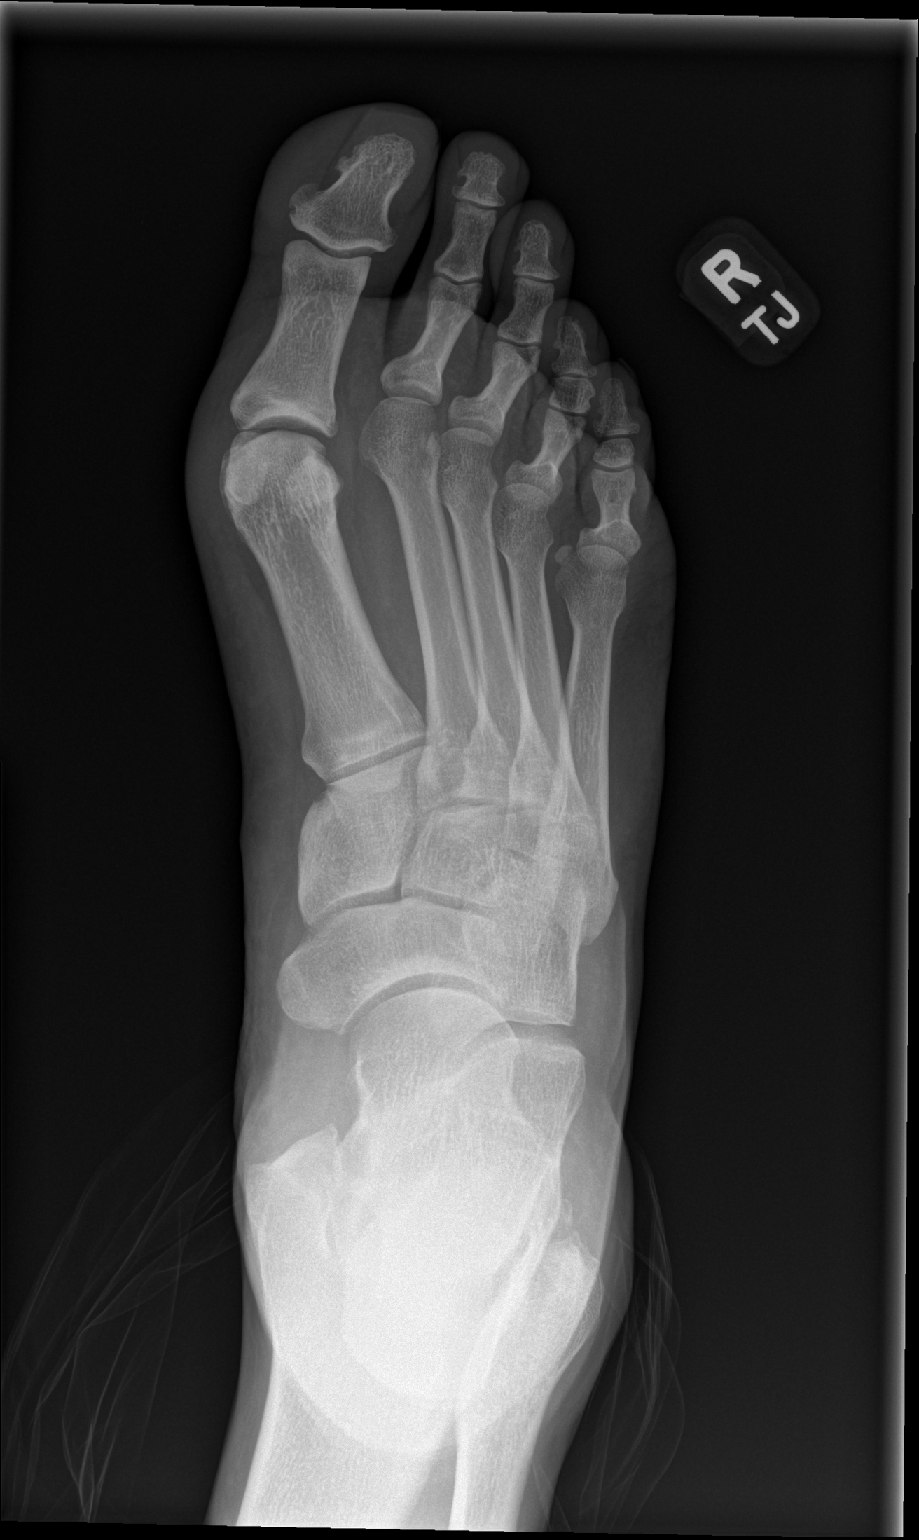

[x foot obl right]
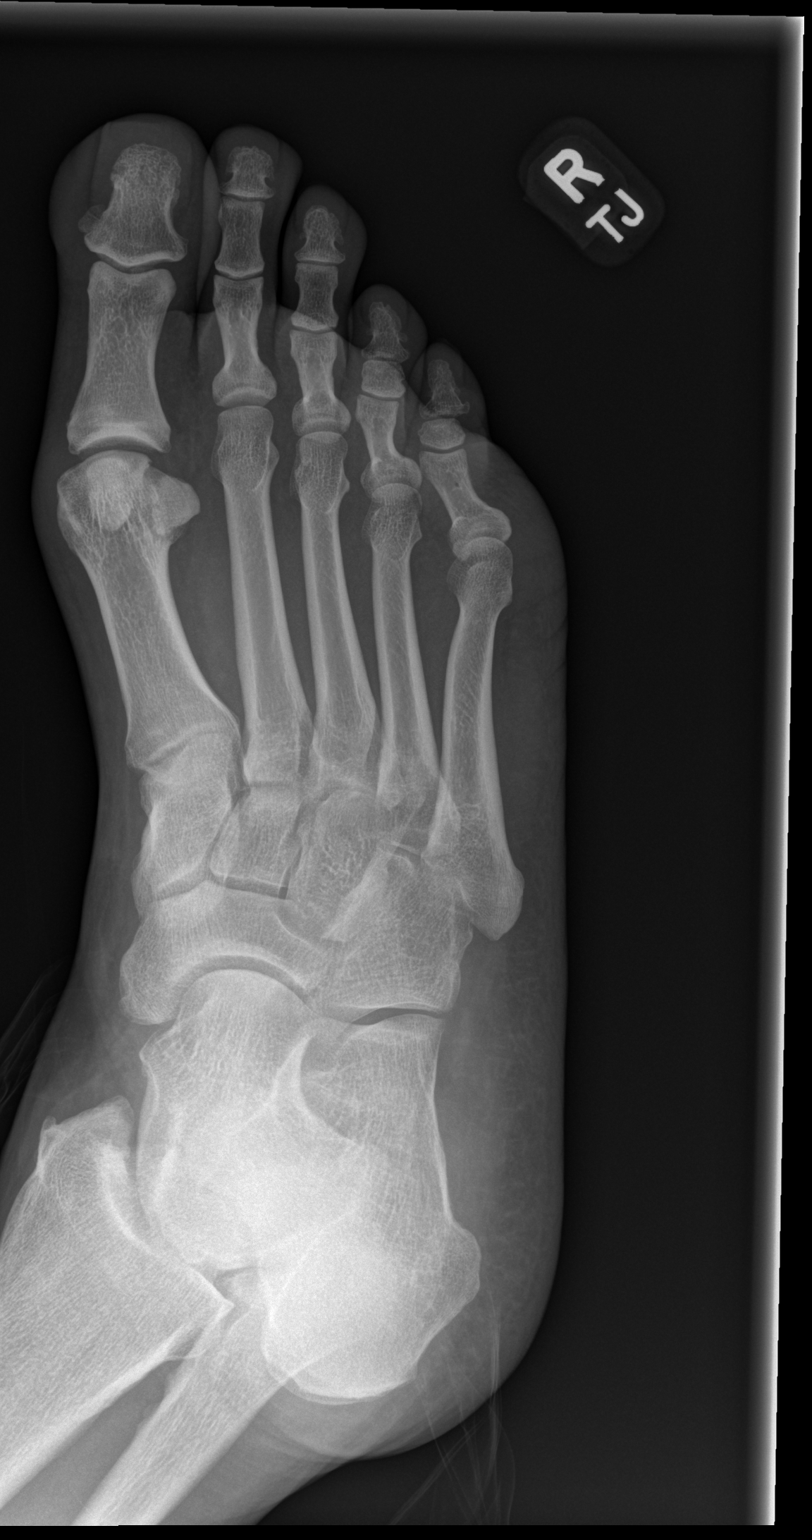

[x foot lat right]
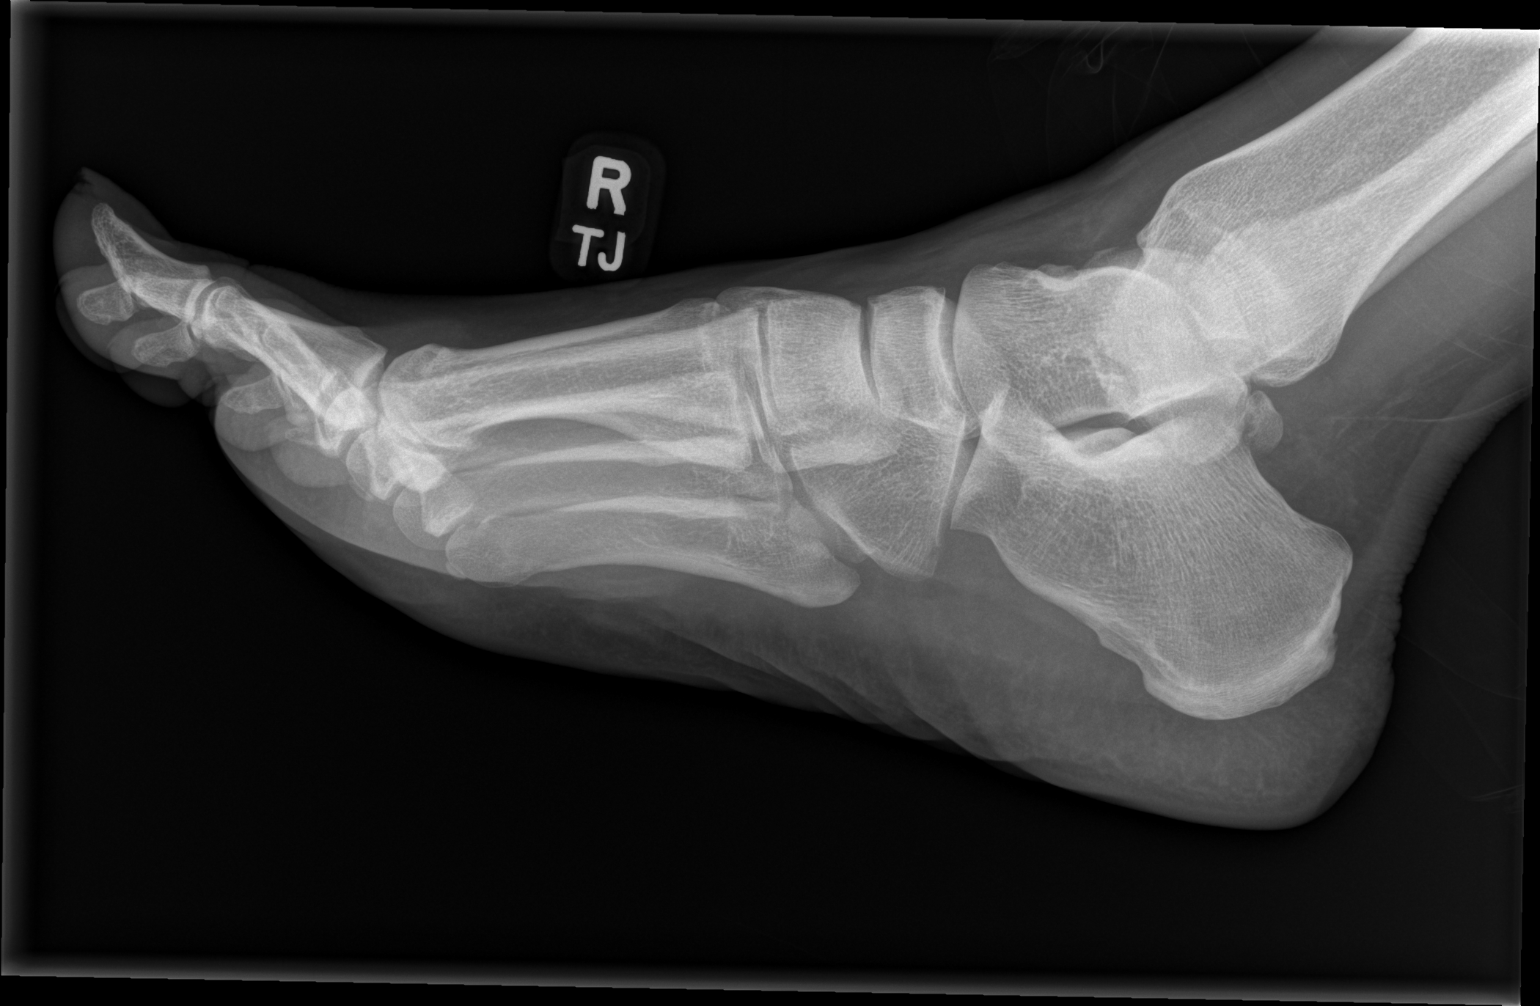

[3 of 3 positions shown; findings below may reference images not displayed]

FINDINGS: There is no evidence of fracture or dislocation. There is no
evidence of arthropathy or other focal bone abnormality. Soft
tissues are unremarkable.
IMPRESSION: Negative.

## 2023-04-25 ENCOUNTER — Emergency Department (HOSPITAL_COMMUNITY)
Admission: EM | Admit: 2023-04-25 | Discharge: 2023-04-25 | Discharge status: Against Medical Advice | Payer: Medicaid - Dental

## 2023-04-26 ENCOUNTER — Encounter (HOSPITAL_COMMUNITY): Payer: Self-pay | Admitting: Emergency Medicine

## 2023-04-26 ENCOUNTER — Other Ambulatory Visit: Payer: Self-pay

## 2023-04-26 ENCOUNTER — Emergency Department (HOSPITAL_COMMUNITY)
Admission: EM | Admit: 2023-04-26 | Discharge: 2023-04-26 | Disposition: A | Discharge status: Home / Self Care | Payer: Medicaid - Dental | Attending: Emergency Medicine | Admitting: Emergency Medicine

## 2023-04-26 DIAGNOSIS — Z20822 Contact with and (suspected) exposure to covid-19: Secondary | ICD-10-CM | POA: Insufficient documentation

## 2023-04-26 DIAGNOSIS — F209 Schizophrenia, unspecified: Secondary | ICD-10-CM | POA: Insufficient documentation

## 2023-04-26 DIAGNOSIS — F17210 Nicotine dependence, cigarettes, uncomplicated: Secondary | ICD-10-CM | POA: Insufficient documentation

## 2023-04-26 DIAGNOSIS — I10 Essential (primary) hypertension: Secondary | ICD-10-CM | POA: Insufficient documentation

## 2023-04-26 LAB — RESP PANEL BY RT-PCR (RSV, FLU A&B, COVID)  RVPGX2
Influenza A by PCR: NEGATIVE
Influenza B by PCR: NEGATIVE
Resp Syncytial Virus by PCR: NEGATIVE
SARS Coronavirus 2 by RT PCR: NEGATIVE

## 2023-04-26 NOTE — ED Provider Notes (Signed)
EMERGENCY DEPARTMENT AT [facility] Provider Note  CSN: [id number] Arrival date & time: 04/26/23 1954  Chief Complaint(s) Covid test  HPI Aaron Rubio is a 42 y.o. male history of hypertension, schizophrenia presenting to the emergency department requesting COVID test.  He reports that he wanted to get tested for COVID because "I am wearing the same clothes and I am worried about developing a virus".    Denies any trouble breathing, chest pain, abdominal pain, headaches, or any other new symptoms.  No runny nose or sore throat.   Past Medical History Past Medical History:  Diagnosis Date   Hypertension    Schizophrenia (HCC)    There are no problems to display for this patient.  Home Medication(s) Prior to Admission medications   Medication Sig Start Date End Date Taking? Authorizing Provider  azithromycin (ZITHROMAX) 250 MG tablet Take 1 tablet (250 mg total) by mouth daily. 06/16/22   Pricilla Loveless, MD  benzonatate (TESSALON) 100 MG capsule Take 1 capsule (100 mg total) by mouth 3 (three) times daily as needed for cough. 06/04/22   Tegeler, Canary Brim, MD  benztropine (COGENTIN) 2 MG tablet Take 1 tablet (2 mg total) by mouth 2 (two) times daily. 07/15/21 08/14/21  Long, Arlyss Repress, MD  cyclobenzaprine (FLEXERIL) 10 MG tablet Take 1 tablet (10 mg total) by mouth 2 (two) times daily as needed for muscle spasms. 06/04/22   Tegeler, Canary Brim, MD  hydrOXYzine (ATARAX) 25 MG tablet Take 1-2 tablets (25-50 mg total) by mouth See admin instructions. Take 25 mg by mouth in the morning and 50 mg at bedtime 07/15/21   Long, Arlyss Repress, MD  metoprolol tartrate (LOPRESSOR) 25 MG tablet Take 1 tablet (25 mg total) by mouth in the morning. 07/15/21 08/14/21  Long, Arlyss Repress, MD  naproxen (NAPROSYN) 375 MG tablet Take 1 tablet (375 mg total) by mouth 2 (two) times daily. 05/11/22   Linwood Dibbles, MD  naproxen (NAPROSYN) 500 MG tablet Take 1 tablet (500 mg total) by mouth 2  (two) times daily. 06/20/22   Jeanelle Malling, PA  ondansetron (ZOFRAN-ODT) 4 MG disintegrating tablet Take 1 tablet (4 mg total) by mouth every 8 (eight) hours as needed for nausea or vomiting. 06/15/22   Pricilla Loveless, MD                                                                                                                                    Past Surgical History History reviewed. No pertinent surgical history. Family History History reviewed. No pertinent family history.  Social History Social History   Tobacco Use   Smoking status: Every Day    Current packs/day: 0.30    Types: Cigarettes  Substance Use Topics   Alcohol use: Not Currently   Drug use: Not Currently   Allergies Patient has no known allergies.  Review of Systems Review of Systems  All other  systems reviewed and are negative.   Physical Exam Vital Signs  I have reviewed the triage vital signs BP (!) 139/99 (BP Location: Left Arm)   Pulse (!) 103   Temp 98.6 F (37 C) (Oral)   Resp 18   SpO2 100%  Physical Exam Vitals and nursing note reviewed.  Constitutional:      General: He is not in acute distress.    Appearance: Normal appearance.  HENT:     Mouth/Throat:     Mouth: Mucous membranes are moist.  Eyes:     Conjunctiva/sclera: Conjunctivae normal.  Cardiovascular:     Rate and Rhythm: Normal rate and regular rhythm.  Pulmonary:     Effort: Pulmonary effort is normal. No respiratory distress.     Breath sounds: Normal breath sounds.  Abdominal:     General: Abdomen is flat.     Palpations: Abdomen is soft.     Tenderness: There is no abdominal tenderness.  Musculoskeletal:     Right lower leg: No edema.     Left lower leg: No edema.  Skin:    General: Skin is warm and dry.     Capillary Refill: Capillary refill takes less than 2 seconds.  Neurological:     Mental Status: He is alert and oriented to person, place, and time. Mental status is at baseline.  Psychiatric:        Mood and  Affect: Mood normal.        Behavior: Behavior normal.     Comments: Mildly disorganized train of thought, but no homicidal or suicidal ideation, able to attend to conversation     ED Results and Treatments Labs (all labs ordered are listed, but only abnormal results are displayed) Labs Reviewed  RESP PANEL BY RT-PCR (RSV, FLU A&B, COVID)  RVPGX2                                                                                                                          Radiology No results found.  Pertinent labs & imaging results that were available during my care of the patient were reviewed by me and considered in my medical decision making (see MDM for details).  Medications Ordered in ED Medications - No data to display  Procedures Procedures  (including critical care time)  Medical Decision Making / ED Course   MDM:  42 year old with history of schizophrenia presenting requesting COVID test.  He does not really seem to have any medical symptoms.  His train of thought is a little disorganized but he is well-groomed and able to attend to conversation.  No suicidal or homicidal ideation.  COVID test was ordered.   Initially with mild tachycardia which improved on recheck.  Encouraged fluid intake.  Patient provided with food and fluids to drink. Will discharge patient to home. All questions answered. Patient comfortable with plan of discharge. Return precautions discussed with patient and specified on the after visit summary.      Additional history obtained:  -External records from outside source obtained and reviewed including: Chart review including previous notes, labs, imaging, consultation notes including prior ER visits    Lab Tests: -I ordered, reviewed, and interpreted labs.   The pertinent results include:   Labs Reviewed  RESP PANEL  BY RT-PCR (RSV, FLU A&B, COVID)  RVPGX2     Medicines ordered and prescription drug management: No orders of the defined types were placed in this encounter.   -I have reviewed the patients home medicines and have made adjustments as needed   Social Determinants of Health:  Diagnosis or treatment significantly limited by social determinants of health: homelessness  Co morbidities that complicate the patient evaluation  Past Medical History:  Diagnosis Date   Hypertension    Schizophrenia (HCC)       Dispostion: Disposition decision including need for hospitalization was considered, and patient discharged from emergency department.    Final Clinical Impression(s) / ED Diagnoses Final diagnoses:  Encounter for laboratory testing for COVID-19 virus  Chronic schizophrenia (HCC)     This chart was dictated using voice recognition software.  Despite best efforts to proofread,  errors can occur which can change the documentation meaning.    Lonell Grandchild, MD 04/26/23 2018

## 2023-04-26 NOTE — ED Triage Notes (Signed)
Pt arriving with request for covid test. No symptoms reported.

## 2023-04-26 NOTE — Discharge Instructions (Signed)
We tested you for COVID. Please check the results in mychart. Drink lots of fluids.

## 2023-04-27 ENCOUNTER — Other Ambulatory Visit: Payer: Self-pay

## 2023-04-27 ENCOUNTER — Emergency Department (HOSPITAL_COMMUNITY)
Admission: EM | Admit: 2023-04-27 | Discharge: 2023-04-28 | Disposition: A | Discharge status: Home / Self Care | Payer: Medicaid Other | Attending: Emergency Medicine | Admitting: Emergency Medicine

## 2023-04-27 DIAGNOSIS — M545 Low back pain, unspecified: Secondary | ICD-10-CM | POA: Insufficient documentation

## 2023-04-27 NOTE — ED Triage Notes (Signed)
Patient reports pain at lower back this week from lying on hard floor , denies injury or fall /ambulatory . No urinary discomfort .

## 2023-04-28 ENCOUNTER — Encounter (HOSPITAL_COMMUNITY): Payer: Self-pay

## 2023-04-28 MED ORDER — ACETAMINOPHEN 500 MG PO TABS
1000.0000 mg | ORAL_TABLET | Freq: Once | ORAL | Status: AC
  Administered 2023-04-28: 1000 mg via ORAL
  Filled 2023-04-28: qty 2

## 2023-04-28 MED ORDER — IBUPROFEN 800 MG PO TABS
800.0000 mg | ORAL_TABLET | Freq: Once | ORAL | Status: AC
  Administered 2023-04-28: 800 mg via ORAL
  Filled 2023-04-28: qty 1

## 2023-04-28 NOTE — ED Provider Notes (Signed)
[place] EMERGENCY DEPARTMENT AT [facility] Provider Note   CSN: [id number] Arrival date & time: 04/27/23  2054     History  Chief Complaint  Patient presents with   Back Pain    Aaron Rubio is a 42 y.o. male.  Presents to the emergency department stating that he is having pain in his lower back.  Patient also concerned because he had an episode of diarrhea after eating [employer] today.  Patient denies direct trauma to the back.  He thinks it might be from sleeping on the floor.  Pain does not radiate to legs, no change in bowel or bladder function.  No loss of strength or sensation in lower extremities.       Home Medications Prior to Admission medications   Medication Sig Start Date End Date Taking? Authorizing Provider  azithromycin (ZITHROMAX) 250 MG tablet Take 1 tablet (250 mg total) by mouth daily. 06/16/22   Pricilla Loveless, MD  benzonatate (TESSALON) 100 MG capsule Take 1 capsule (100 mg total) by mouth 3 (three) times daily as needed for cough. 06/04/22   Tegeler, Canary Brim, MD  benztropine (COGENTIN) 2 MG tablet Take 1 tablet (2 mg total) by mouth 2 (two) times daily. 07/15/21 08/14/21  Long, Arlyss Repress, MD  cyclobenzaprine (FLEXERIL) 10 MG tablet Take 1 tablet (10 mg total) by mouth 2 (two) times daily as needed for muscle spasms. 06/04/22   Tegeler, Canary Brim, MD  hydrOXYzine (ATARAX) 25 MG tablet Take 1-2 tablets (25-50 mg total) by mouth See admin instructions. Take 25 mg by mouth in the morning and 50 mg at bedtime 07/15/21   Long, Arlyss Repress, MD  metoprolol tartrate (LOPRESSOR) 25 MG tablet Take 1 tablet (25 mg total) by mouth in the morning. 07/15/21 08/14/21  Long, Arlyss Repress, MD  naproxen (NAPROSYN) 375 MG tablet Take 1 tablet (375 mg total) by mouth 2 (two) times daily. 05/11/22   Linwood Dibbles, MD  naproxen (NAPROSYN) 500 MG tablet Take 1 tablet (500 mg total) by mouth 2 (two) times daily. 06/20/22   Jeanelle Malling, PA  ondansetron (ZOFRAN-ODT) 4 MG  disintegrating tablet Take 1 tablet (4 mg total) by mouth every 8 (eight) hours as needed for nausea or vomiting. 06/15/22   Pricilla Loveless, MD      Allergies    Patient has no known allergies.    Review of Systems   Review of Systems  Physical Exam Updated Vital Signs BP 125/83   Pulse 92   Temp 98.3 F (36.8 C)   Resp 14   Ht 6\' 1"  (1.854 m)   Wt 81 kg   SpO2 97%   BMI 23.56 kg/m  Physical Exam Vitals and nursing note reviewed.  Constitutional:      General: He is not in acute distress.    Appearance: He is well-developed.  HENT:     Head: Normocephalic and atraumatic.     Mouth/Throat:     Mouth: Mucous membranes are moist.  Eyes:     General: Vision grossly intact. Gaze aligned appropriately.     Extraocular Movements: Extraocular movements intact.     Conjunctiva/sclera: Conjunctivae normal.  Cardiovascular:     Rate and Rhythm: Normal rate and regular rhythm.     Pulses: Normal pulses.     Heart sounds: Normal heart sounds, S1 normal and S2 normal. No murmur heard.    No friction rub. No gallop.  Pulmonary:     Effort: Pulmonary effort is normal.  No respiratory distress.     Breath sounds: Normal breath sounds.  Abdominal:     Palpations: Abdomen is soft.     Tenderness: There is no abdominal tenderness. There is no guarding or rebound.     Hernia: No hernia is present.  Musculoskeletal:        General: No swelling.     Cervical back: Full passive range of motion without pain, normal range of motion and neck supple. No pain with movement, spinous process tenderness or muscular tenderness. Normal range of motion.       Back:     Right lower leg: No edema.     Left lower leg: No edema.  Skin:    General: Skin is warm and dry.     Capillary Refill: Capillary refill takes less than 2 seconds.     Findings: No ecchymosis, erythema, lesion or wound.  Neurological:     Mental Status: He is alert and oriented to person, place, and time.     GCS: GCS eye  subscore is 4. GCS verbal subscore is 5. GCS motor subscore is 6.     Cranial Nerves: Cranial nerves 2-12 are intact.     Sensory: Sensation is intact.     Motor: Motor function is intact. No weakness or abnormal muscle tone.     Coordination: Coordination is intact.  Psychiatric:        Mood and Affect: Mood normal.        Speech: Speech normal.        Behavior: Behavior normal.     ED Results / Procedures / Treatments   Labs (all labs ordered are listed, but only abnormal results are displayed) Labs Reviewed - No data to display  EKG None  Radiology No results found.  Procedures Procedures    Medications Ordered in ED Medications  ibuprofen (ADVIL) tablet 800 mg (800 mg Oral Given 04/28/23 0419)  acetaminophen (TYLENOL) tablet 1,000 mg (1,000 mg Oral Given 04/28/23 0419)    ED Course/ Medical Decision Making/ A&P                                 Medical Decision Making  Examination reveals tenderness in the area of the tailbone.  No deformity palpated.  No induration fluctuance or signs of abscess.  Patient is homeless.  He is asking for food.  I suspect he is here because he has nowhere else to go.  He appears well, vital signs unremarkable.  Examination without concerning features.  Reassured, fed.        Final Clinical Impression(s) / ED Diagnoses Final diagnoses:  Acute midline low back pain without sciatica    Rx / DC Orders ED Discharge Orders     None         Allenmichael Mcpartlin, Canary Brim, MD 04/28/23 [phone]

## 2023-04-29 ENCOUNTER — Other Ambulatory Visit: Payer: Self-pay

## 2023-04-29 ENCOUNTER — Encounter (HOSPITAL_COMMUNITY): Payer: Self-pay

## 2023-04-29 ENCOUNTER — Emergency Department (HOSPITAL_COMMUNITY): Payer: Medicaid Other

## 2023-04-29 ENCOUNTER — Emergency Department (HOSPITAL_COMMUNITY)
Admission: EM | Admit: 2023-04-29 | Discharge: 2023-04-29 | Disposition: A | Discharge status: Home / Self Care | Payer: Medicaid Other | Attending: Emergency Medicine | Admitting: Emergency Medicine

## 2023-04-29 DIAGNOSIS — M545 Low back pain, unspecified: Secondary | ICD-10-CM | POA: Insufficient documentation

## 2023-04-29 DIAGNOSIS — R42 Dizziness and giddiness: Secondary | ICD-10-CM | POA: Diagnosis present

## 2023-04-29 DIAGNOSIS — M7918 Myalgia, other site: Secondary | ICD-10-CM

## 2023-04-29 LAB — CBC
HCT: 42.9 % (ref 39.0–52.0)
Hemoglobin: 13.7 g/dL (ref 13.0–17.0)
MCH: 27.5 pg (ref 26.0–34.0)
MCHC: 31.9 g/dL (ref 30.0–36.0)
MCV: 86 fL (ref 80.0–100.0)
Platelets: 219 10*3/uL (ref 150–400)
RBC: 4.99 MIL/uL (ref 4.22–5.81)
RDW: 12.6 % (ref 11.5–15.5)
WBC: 9.9 10*3/uL (ref 4.0–10.5)
nRBC: 0 % (ref 0.0–0.2)

## 2023-04-29 LAB — BASIC METABOLIC PANEL
Anion gap: 11 (ref 5–15)
BUN: 20 mg/dL (ref 6–20)
CO2: 28 mmol/L (ref 22–32)
Calcium: 9.5 mg/dL (ref 8.9–10.3)
Chloride: 101 mmol/L (ref 98–111)
Creatinine, Ser: 1.09 mg/dL (ref 0.61–1.24)
GFR, Estimated: >60 mL/min (ref >60)
Glucose, Bld: 88 mg/dL (ref 70–99)
Potassium: 3.4 mmol/L — ABNORMAL LOW (ref 3.5–5.1)
Sodium: 140 mmol/L (ref 135–145)

## 2023-04-29 MED ORDER — NAPROXEN 500 MG PO TABS
500.0000 mg | ORAL_TABLET | Freq: Once | ORAL | Status: AC
  Administered 2023-04-29: 500 mg via ORAL
  Filled 2023-04-29: qty 1

## 2023-04-29 MED ORDER — NAPROXEN 375 MG PO TABS: 375.0000 mg | ORAL_TABLET | Freq: Two times a day (BID) | ORAL | 0 refills | Status: DC

## 2023-04-29 NOTE — ED Notes (Signed)
Pt made aware of needing urine sample.

## 2023-04-29 NOTE — Discharge Instructions (Signed)
You have been seen and discharged from the emergency department.  Your x-rays were normal.  Your blood work was normal.  You were given a dose of the pain medicine here in the department and a prescription has been sent.  Please establish care with an orthopedic/spine doctor for further evaluation of chronic low back pain.  Follow-up with your primary provider for further evaluation and further care. Take home medications as prescribed. If you have any worsening symptoms or further concerns for your health please return to an emergency department for further evaluation.

## 2023-04-29 NOTE — ED Provider Notes (Signed)
[place] EMERGENCY DEPARTMENT AT [facility] [address] [address] Provider Note   CSN: [id number] Arrival date & time: 04/29/23  5284     History  Chief Complaint  Patient presents with   Tailbone Pain   Dizziness    Aaron Rubio is a 42 y.o. male.  HPI   42 year old male presents emergency department complaining of right sided SI/tailbone discomfort.  This has been a chronic complaint for the patient.  Patient has been seen for similar complaints in the past.  He has not established outpatient care with orthopedic/spine.  He does not have any advancing red flag symptoms including difficulty with urination, saddle anesthesia, numbness or weakness of the lower extremities.  He is ambulating at baseline.  He denies any acute trauma to the area.  Otherwise has been feeling slightly fatigued but denies any acute complaints including headache, chest pain, shortness of breath, fever.  Home Medications Prior to Admission medications   Medication Sig Start Date End Date Taking? Authorizing Provider  azithromycin (ZITHROMAX) 250 MG tablet Take 1 tablet (250 mg total) by mouth daily. 06/16/22   Pricilla Loveless, MD  benzonatate (TESSALON) 100 MG capsule Take 1 capsule (100 mg total) by mouth 3 (three) times daily as needed for cough. 06/04/22   Tegeler, Canary Brim, MD  benztropine (COGENTIN) 2 MG tablet Take 1 tablet (2 mg total) by mouth 2 (two) times daily. 07/15/21 08/14/21  Long, Arlyss Repress, MD  cyclobenzaprine (FLEXERIL) 10 MG tablet Take 1 tablet (10 mg total) by mouth 2 (two) times daily as needed for muscle spasms. 06/04/22   Tegeler, Canary Brim, MD  hydrOXYzine (ATARAX) 25 MG tablet Take 1-2 tablets (25-50 mg total) by mouth See admin instructions. Take 25 mg by mouth in the morning and 50 mg at bedtime 07/15/21   Long, Arlyss Repress, MD  metoprolol tartrate (LOPRESSOR) 25 MG tablet Take 1 tablet (25 mg total) by mouth in the morning. 07/15/21 08/14/21  Long, Arlyss Repress, MD  naproxen (NAPROSYN)  375 MG tablet Take 1 tablet (375 mg total) by mouth 2 (two) times daily. 05/11/22   Linwood Dibbles, MD  naproxen (NAPROSYN) 500 MG tablet Take 1 tablet (500 mg total) by mouth 2 (two) times daily. 06/20/22   Jeanelle Malling, PA  ondansetron (ZOFRAN-ODT) 4 MG disintegrating tablet Take 1 tablet (4 mg total) by mouth every 8 (eight) hours as needed for nausea or vomiting. 06/15/22   Pricilla Loveless, MD      Allergies    Patient has no known allergies.    Review of Systems   Review of Systems  Constitutional:  Negative for fever.  Respiratory:  Negative for shortness of breath.   Cardiovascular:  Negative for chest pain.  Gastrointestinal:  Negative for abdominal pain, diarrhea and vomiting.  Genitourinary:  Negative for difficulty urinating.  Musculoskeletal:  Positive for back pain.  Skin:  Negative for rash.  Neurological:  Negative for weakness, numbness and headaches.    Physical Exam Updated Vital Signs BP (!) 161/105 (BP Location: Left Arm)   Pulse 60   Temp 97.8 F (36.6 C) (Oral)   Resp 18   Ht 6\' 2"  (1.88 m)   Wt 84.8 kg   SpO2 100%   BMI 24.01 kg/m  Physical Exam Vitals and nursing note reviewed.  Constitutional:      General: He is not in acute distress.    Appearance: Normal appearance.  HENT:     Head: Normocephalic.     Mouth/Throat:  Mouth: Mucous membranes are moist.  Cardiovascular:     Rate and Rhythm: Normal rate.  Pulmonary:     Effort: Pulmonary effort is normal. No respiratory distress.  Abdominal:     Palpations: Abdomen is soft.     Tenderness: There is no abdominal tenderness.  Musculoskeletal:     Comments: Mild tenderness to palpation in the right but looks around the SI area.  No midline spinal tenderness to palpation, pelvis is stable and nontender, he has full range of motion and is neuro vascularly intact in the lower extremities.  Skin:    General: Skin is warm.  Neurological:     Mental Status: He is alert and oriented to person, place, and  time. Mental status is at baseline.  Psychiatric:        Mood and Affect: Mood normal.     ED Results / Procedures / Treatments   Labs (all labs ordered are listed, but only abnormal results are displayed) Labs Reviewed  BASIC METABOLIC PANEL - Abnormal; Notable for the following components:      Result Value   Potassium 3.4 (*)    All other components within normal limits  CBC  URINALYSIS, ROUTINE W REFLEX MICROSCOPIC    EKG EKG Interpretation Date/Time:  Sunday April 29 2023 06:41:03 EDT Ventricular Rate:  76 PR Interval:  153 QRS Duration:  87 QT Interval:  406 QTC Calculation: 457 R Axis:   -3  Text Interpretation: Sinus rhythm Confirmed by Coralee Pesa [phone]) on 04/29/2023 7:54:51 AM  Radiology DG Sacrum/Coccyx  Result Date: 04/29/2023 CLINICAL DATA:  42 year old male with history of pain in the tailbone since yesterday evening. EXAM: SACRUM AND COCCYX - 2+ VIEW COMPARISON:  No priors. FINDINGS: There is no evidence of fracture or other focal bone lesions. IMPRESSION: Negative. Electronically Signed   By: Trudie Reed M.D.   On: 04/29/2023 07:40    Procedures Procedures    Medications Ordered in ED Medications - No data to display  ED Course/ Medical Decision Making/ A&P                                 Medical Decision Making Amount and/or Complexity of Data Reviewed Labs: ordered. Radiology: ordered.  Risk Prescription drug management.   42 year old male presents emergency department with lower back/buttock pain.  This appears to be a chronic complaint for the patient.  He has reproducible tenderness in the right lower back and buttock/SI area.  He has no other red flag symptoms involving the lower back/lower extremities.  Vitals are normal and stable.  He is neurovascularly intact.  Has been ambulatory per baseline.  Had some nonspecific complaints earlier but his blood work is normal.  Has been able to tolerate food/drink and a dose of  naproxen.  Feels improved.  I have encouraged the patient to establish care with orthopedics/spine for his ongoing lower back/buttocks complaints.  Patient at this time appears safe and stable for discharge and close outpatient follow up. Discharge plan and strict return to ED precautions discussed, patient verbalizes understanding and agreement.        Final Clinical Impression(s) / ED Diagnoses Final diagnoses:  None    Rx / DC Orders ED Discharge Orders     None         Rozelle Logan, DO 04/29/23 1033

## 2023-04-29 NOTE — ED Triage Notes (Signed)
Pt BIB [facility] d/t tail bone pain and pt states he has had some dizziness since mid day yesterday.  Pt has hz of anemia.

## 2023-04-29 NOTE — ED Notes (Signed)
Ambulated with pt in the hallway.

## 2023-05-03 ENCOUNTER — Emergency Department (HOSPITAL_COMMUNITY): Payer: Medicaid Other

## 2023-05-03 ENCOUNTER — Encounter (HOSPITAL_COMMUNITY): Payer: Self-pay

## 2023-05-03 ENCOUNTER — Other Ambulatory Visit: Payer: Self-pay

## 2023-05-03 ENCOUNTER — Emergency Department (HOSPITAL_COMMUNITY)
Admission: EM | Admit: 2023-05-03 | Discharge: 2023-05-04 | Disposition: A | Discharge status: Home / Self Care | Payer: Medicaid Other | Attending: Emergency Medicine | Admitting: Emergency Medicine

## 2023-05-03 DIAGNOSIS — J029 Acute pharyngitis, unspecified: Secondary | ICD-10-CM | POA: Diagnosis present

## 2023-05-03 DIAGNOSIS — Z5901 Sheltered homelessness: Secondary | ICD-10-CM | POA: Insufficient documentation

## 2023-05-03 DIAGNOSIS — F1910 Other psychoactive substance abuse, uncomplicated: Secondary | ICD-10-CM | POA: Insufficient documentation

## 2023-05-03 DIAGNOSIS — Z79899 Other long term (current) drug therapy: Secondary | ICD-10-CM | POA: Diagnosis not present

## 2023-05-03 DIAGNOSIS — Z1152 Encounter for screening for COVID-19: Secondary | ICD-10-CM | POA: Diagnosis not present

## 2023-05-03 DIAGNOSIS — F17200 Nicotine dependence, unspecified, uncomplicated: Secondary | ICD-10-CM | POA: Diagnosis not present

## 2023-05-03 DIAGNOSIS — J069 Acute upper respiratory infection, unspecified: Secondary | ICD-10-CM | POA: Diagnosis not present

## 2023-05-03 DIAGNOSIS — R440 Auditory hallucinations: Secondary | ICD-10-CM | POA: Insufficient documentation

## 2023-05-03 DIAGNOSIS — I10 Essential (primary) hypertension: Secondary | ICD-10-CM | POA: Diagnosis not present

## 2023-05-03 LAB — SARS CORONAVIRUS 2 BY RT PCR: SARS Coronavirus 2 by RT PCR: NEGATIVE

## 2023-05-03 MED ORDER — BENZONATATE 100 MG PO CAPS: 100.0000 mg | ORAL_CAPSULE | Freq: Three times a day (TID) | ORAL | 0 refills | Status: DC

## 2023-05-03 MED ORDER — IBUPROFEN 400 MG PO TABS
600.0000 mg | ORAL_TABLET | Freq: Once | ORAL | Status: AC
  Administered 2023-05-04: 600 mg via ORAL
  Filled 2023-05-03: qty 1

## 2023-05-03 NOTE — Discharge Instructions (Signed)
Your symptoms today are consistent with an upper respiratory infection.  I have prescribed Tessalon Perles to help with cough.  Please take over-the-counter medicine for the body aches including acetaminophen or ibuprofen.  If you develop any life-threatening symptoms please return to the emergency department.

## 2023-05-03 NOTE — ED Triage Notes (Signed)
Pt is coming in with covid/flu like symptoms with unspecified time of when they started, has no chest pain no shortness of breath and is otherwise cooperative in triage.

## 2023-05-03 NOTE — ED Provider Notes (Signed)
[name] Provider Note   CSN: [id number] Arrival date & time: 05/03/23  2047     History  Chief Complaint  Patient presents with   Cough    [name] is a 42 y.o. male.  Patient presents to the emergency department complaining of cough, body aches, sore throat for the past 2 days.  He denies any known fevers.  Patient denies chest pain, shortness of breath, known sick contacts.  Past medical history significant for schizophrenia, hypertension, tobacco usage   Cough      Home Medications Prior to Admission medications   Medication Sig Start Date End Date Taking? Authorizing Provider  benzonatate (TESSALON) 100 MG capsule Take 1 capsule (100 mg total) by mouth every 8 (eight) hours. 05/03/23  Yes Darrick Grinder, PA-C  azithromycin (ZITHROMAX) 250 MG tablet Take 1 tablet (250 mg total) by mouth daily. 06/16/22   Pricilla Loveless, MD  benztropine (COGENTIN) 2 MG tablet Take 1 tablet (2 mg total) by mouth 2 (two) times daily. 07/15/21 08/14/21  Long, Arlyss Repress, MD  cyclobenzaprine (FLEXERIL) 10 MG tablet Take 1 tablet (10 mg total) by mouth 2 (two) times daily as needed for muscle spasms. 06/04/22   Tegeler, Canary Brim, MD  hydrOXYzine (ATARAX) 25 MG tablet Take 1-2 tablets (25-50 mg total) by mouth See admin instructions. Take 25 mg by mouth in the morning and 50 mg at bedtime 07/15/21   Long, Arlyss Repress, MD  metoprolol tartrate (LOPRESSOR) 25 MG tablet Take 1 tablet (25 mg total) by mouth in the morning. 07/15/21 08/14/21  Long, Arlyss Repress, MD  naproxen (NAPROSYN) 375 MG tablet Take 1 tablet (375 mg total) by mouth 2 (two) times daily. 04/29/23   Horton, Danford Bad M, DO  ondansetron (ZOFRAN-ODT) 4 MG disintegrating tablet Take 1 tablet (4 mg total) by mouth every 8 (eight) hours as needed for nausea or vomiting. 06/15/22   Pricilla Loveless, MD      Allergies    Patient has no known allergies.    Review of Systems   Review of  Systems  Respiratory:  Positive for cough.     Physical Exam Updated Vital Signs BP (!) 131/93   Pulse 91   Temp 98.2 F (36.8 C)   Resp 14   SpO2 100%  Physical Exam Vitals and nursing note reviewed.  Constitutional:      General: He is not in acute distress.    Appearance: He is well-developed.  HENT:     Head: Normocephalic and atraumatic.     Mouth/Throat:     Pharynx: Oropharynx is clear. No oropharyngeal exudate or posterior oropharyngeal erythema.  Eyes:     Conjunctiva/sclera: Conjunctivae normal.  Cardiovascular:     Rate and Rhythm: Normal rate and regular rhythm.  Pulmonary:     Effort: Pulmonary effort is normal. No respiratory distress.     Breath sounds: Normal breath sounds.  Musculoskeletal:        General: No swelling.     Cervical back: Neck supple.  Skin:    General: Skin is warm and dry.     Capillary Refill: Capillary refill takes less than 2 seconds.  Neurological:     Mental Status: He is alert.  Psychiatric:        Mood and Affect: Mood normal.     ED Results / Procedures / Treatments   Labs (all labs ordered are listed, but only abnormal results are displayed) Labs Reviewed  SARS CORONAVIRUS 2 BY RT PCR    EKG None  Radiology DG Chest 2 View  Result Date: 05/03/2023 CLINICAL DATA:  Cough, shortness of breath and left-sided chest pain x 14 hours. EXAM: CHEST - 2 VIEW COMPARISON:  June 15, 2022 FINDINGS: The heart size and mediastinal contours are within normal limits. Both lungs are clear. The visualized skeletal structures are unremarkable. IMPRESSION: No active cardiopulmonary disease. Electronically Signed   By: Aram Candela M.D.   On: 05/03/2023 22:43    Procedures Procedures    Medications Ordered in ED Medications - No data to display  ED Course/ Medical Decision Making/ A&P                                 Medical Decision Making Amount and/or Complexity of Data Reviewed Radiology: ordered.   This patient  presents to the ED for concern of bodyaches, sore throat, cough, this involves an extensive number of treatment options, and is a complaint that carries with it a high risk of complications and morbidity.  The differential diagnosis includes COVID, influenza, other viral illness, strep throat, others   Co morbidities that complicate the patient evaluation  Schizophrenia, hypertension   Additional history obtained:   External records from outside source obtained and reviewed including emergency department notes.  Patient with 4 previous emergency department visits over the past 8 days including visit for laboratory testing, midline low back pain, buttock pain   Lab Tests:  I Ordered, and personally interpreted labs.  The pertinent results include: Negative COVID test.  No tonsillar exudate, no cervical lymphadenopathy, cough present.  Centor score 0.  No indication for strep testing or antibiotics.  Plan to discharge with prescription for Tessalon Perles and recommendations for supportive care at home.   Imaging Studies ordered:  I ordered imaging studies including chest x-ray I independently visualized and interpreted imaging which showed no acute findings I agree with the radiologist interpretation    Social Determinants of Health:  Patient has no health insurance, is a daily smoker   Test / Admission - Considered:  Patient with symptoms consistent with upper respiratory infection.  No red flag symptoms at this time.         Final Clinical Impression(s) / ED Diagnoses Final diagnoses:  Acute upper respiratory infection    Rx / DC Orders ED Discharge Orders          Ordered    benzonatate (TESSALON) 100 MG capsule  Every 8 hours        05/03/23 2258              Pamala Duffel 05/03/23 2300    Jacalyn Lefevre, MD 05/04/23 1900

## 2023-05-04 ENCOUNTER — Emergency Department (HOSPITAL_COMMUNITY)
Admission: EM | Admit: 2023-05-04 | Discharge: 2023-05-05 | Disposition: A | Discharge status: Home / Self Care | Payer: Medicaid Other | Attending: Emergency Medicine | Admitting: Emergency Medicine

## 2023-05-04 DIAGNOSIS — R443 Hallucinations, unspecified: Secondary | ICD-10-CM

## 2023-05-04 DIAGNOSIS — F1910 Other psychoactive substance abuse, uncomplicated: Secondary | ICD-10-CM | POA: Insufficient documentation

## 2023-05-04 DIAGNOSIS — R440 Auditory hallucinations: Secondary | ICD-10-CM | POA: Insufficient documentation

## 2023-05-04 DIAGNOSIS — F29 Unspecified psychosis not due to a substance or known physiological condition: Secondary | ICD-10-CM

## 2023-05-04 LAB — CBC WITH DIFFERENTIAL/PLATELET
Abs Immature Granulocytes: 0.04 10*3/uL (ref 0.00–0.07)
Basophils Absolute: 0.1 10*3/uL (ref 0.0–0.1)
Basophils Relative: 1 %
Eosinophils Absolute: 0.5 10*3/uL (ref 0.0–0.5)
Eosinophils Relative: 6 %
HCT: 40.7 % (ref 39.0–52.0)
Hemoglobin: 12.6 g/dL — ABNORMAL LOW (ref 13.0–17.0)
Immature Granulocytes: 1 %
Lymphocytes Relative: 19 %
Lymphs Abs: 1.5 10*3/uL (ref 0.7–4.0)
MCH: 27.3 pg (ref 26.0–34.0)
MCHC: 31 g/dL (ref 30.0–36.0)
MCV: 88.3 fL (ref 80.0–100.0)
Monocytes Absolute: 0.6 10*3/uL (ref 0.1–1.0)
Monocytes Relative: 8 %
Neutro Abs: 5.1 10*3/uL (ref 1.7–7.7)
Neutrophils Relative %: 65 %
Platelets: 226 10*3/uL (ref 150–400)
RBC: 4.61 MIL/uL (ref 4.22–5.81)
RDW: 12.6 % (ref 11.5–15.5)
WBC: 7.8 10*3/uL (ref 4.0–10.5)
nRBC: 0 % (ref 0.0–0.2)

## 2023-05-04 LAB — ETHANOL: Alcohol, Ethyl (B): <10 mg/dL (ref <10)

## 2023-05-04 LAB — COMPREHENSIVE METABOLIC PANEL
ALT: 44 U/L (ref 0–44)
AST: 59 U/L — ABNORMAL HIGH (ref 15–41)
Albumin: 3.7 g/dL (ref 3.5–5.0)
Alkaline Phosphatase: 55 U/L (ref 38–126)
Anion gap: 10 (ref 5–15)
BUN: 18 mg/dL (ref 6–20)
CO2: 28 mmol/L (ref 22–32)
Calcium: 9.5 mg/dL (ref 8.9–10.3)
Chloride: 98 mmol/L (ref 98–111)
Creatinine, Ser: 1.11 mg/dL (ref 0.61–1.24)
GFR, Estimated: >60 mL/min (ref >60)
Glucose, Bld: 110 mg/dL — ABNORMAL HIGH (ref 70–99)
Potassium: 3.7 mmol/L (ref 3.5–5.1)
Sodium: 136 mmol/L (ref 135–145)
Total Bilirubin: 0.6 mg/dL (ref 0.3–1.2)
Total Protein: 6.1 g/dL — ABNORMAL LOW (ref 6.5–8.1)

## 2023-05-04 NOTE — ED Notes (Signed)
Gave pt ham sandwich and ginger ale

## 2023-05-04 NOTE — BH Assessment (Signed)
Clinician spoke to [facility] IRIS to complete TTS assessment. Clinician provided pt's name MRN, location, age, provider name and room number. Secure message will be sent for the time of the assessment. Secure message completed.      Redmond Pulling, MS, [facility], [facility] Triage Specialist

## 2023-05-04 NOTE — ED Triage Notes (Signed)
Patient here from street reporting auditory hallucinations. Hx of schizophrenia.

## 2023-05-04 NOTE — ED Provider Notes (Addendum)
[place] EMERGENCY DEPARTMENT AT [facility] Provider Note   CSN: [id number] Arrival date & time: 05/04/23  1749     History  Chief Complaint  Patient presents with   Hallucinations    Fermin Minas Bonser is a 42 y.o. male.  Pt is a 42 yo male with pmhx significant for schizophrenia.  Pt is not taking his psych medications.  He said he was having a lot of auditory hallucinations.  He said he's been talking back to them.  He denies si.         Home Medications Prior to Admission medications   Medication Sig Start Date End Date Taking? Authorizing Provider  TYLENOL 500 MG tablet Take 500-1,000 mg by mouth every 6 (six) hours as needed for mild pain (pain score 1-3) or headache.   Yes [provider]  azithromycin (ZITHROMAX) 250 MG tablet Take 1 tablet (250 mg total) by mouth daily. Patient not taking: Reported on 05/04/2023 06/16/22   Pricilla Loveless, MD  benzonatate (TESSALON) 100 MG capsule Take 1 capsule (100 mg total) by mouth every 8 (eight) hours. Patient not taking: Reported on 05/04/2023 05/03/23   Darrick Grinder, PA-C  benztropine (COGENTIN) 2 MG tablet Take 1 tablet (2 mg total) by mouth 2 (two) times daily. Patient not taking: Reported on 05/04/2023 07/15/21 05/04/23  Long, Arlyss Repress, MD  cyclobenzaprine (FLEXERIL) 10 MG tablet Take 1 tablet (10 mg total) by mouth 2 (two) times daily as needed for muscle spasms. Patient not taking: Reported on 05/04/2023 06/04/22   Tegeler, Canary Brim, MD  hydrOXYzine (ATARAX) 25 MG tablet Take 1-2 tablets (25-50 mg total) by mouth See admin instructions. Take 25 mg by mouth in the morning and 50 mg at bedtime Patient not taking: Reported on 05/04/2023 07/15/21   Long, Arlyss Repress, MD  metoprolol tartrate (LOPRESSOR) 25 MG tablet Take 1 tablet (25 mg total) by mouth in the morning. Patient not taking: Reported on 05/04/2023 07/15/21 05/04/23  Maia Plan, MD  naproxen (NAPROSYN) 375 MG tablet Take 1 tablet (375 mg  total) by mouth 2 (two) times daily. Patient not taking: Reported on 05/04/2023 04/29/23   Horton, Danford Bad M, DO  ondansetron (ZOFRAN-ODT) 4 MG disintegrating tablet Take 1 tablet (4 mg total) by mouth every 8 (eight) hours as needed for nausea or vomiting. Patient not taking: Reported on 05/04/2023 06/15/22   Pricilla Loveless, MD      Allergies    Patient has no known allergies.    Review of Systems   Review of Systems  Psychiatric/Behavioral:  Positive for hallucinations.   All other systems reviewed and are negative.   Physical Exam Updated Vital Signs BP (!) 138/93 (BP Location: Right Arm)   Pulse 94   Temp 98.7 F (37.1 C) (Oral)   Resp 18   SpO2 99%  Physical Exam Vitals and nursing note reviewed.  Constitutional:      Appearance: Normal appearance.  HENT:     Head: Normocephalic and atraumatic.     Right Ear: External ear normal.     Left Ear: External ear normal.     Nose: Nose normal.     Mouth/Throat:     Mouth: Mucous membranes are moist.     Pharynx: Oropharynx is clear.  Eyes:     Extraocular Movements: Extraocular movements intact.     Conjunctiva/sclera: Conjunctivae normal.     Pupils: Pupils are equal, round, and reactive to light.  Cardiovascular:  Rate and Rhythm: Normal rate and regular rhythm.     Pulses: Normal pulses.     Heart sounds: Normal heart sounds.  Pulmonary:     Effort: Pulmonary effort is normal.     Breath sounds: Normal breath sounds.  Abdominal:     General: Abdomen is flat. Bowel sounds are normal.     Palpations: Abdomen is soft.  Musculoskeletal:        General: Normal range of motion.     Cervical back: Normal range of motion and neck supple.  Skin:    General: Skin is warm.     Capillary Refill: Capillary refill takes less than 2 seconds.  Neurological:     General: No focal deficit present.     Mental Status: He is alert and oriented to person, place, and time.  Psychiatric:        Attention and Perception: He  perceives auditory hallucinations.     ED Results / Procedures / Treatments   Labs (all labs ordered are listed, but only abnormal results are displayed) Labs Reviewed  COMPREHENSIVE METABOLIC PANEL - Abnormal; Notable for the following components:      Result Value   Glucose, Bld 110 (*)    Total Protein 6.1 (*)    AST 59 (*)    All other components within normal limits  CBC WITH DIFFERENTIAL/PLATELET - Abnormal; Notable for the following components:   Hemoglobin 12.6 (*)    All other components within normal limits  ETHANOL  RAPID URINE DRUG SCREEN, HOSP PERFORMED  CBC WITH DIFFERENTIAL/PLATELET    EKG None  Radiology DG Chest 2 View  Result Date: 05/03/2023 CLINICAL DATA:  Cough, shortness of breath and left-sided chest pain x 14 hours. EXAM: CHEST - 2 VIEW COMPARISON:  June 15, 2022 FINDINGS: The heart size and mediastinal contours are within normal limits. Both lungs are clear. The visualized skeletal structures are unremarkable. IMPRESSION: No active cardiopulmonary disease. Electronically Signed   By: Aram Candela M.D.   On: 05/03/2023 22:43    Procedures Procedures    Medications Ordered in ED Medications - No data to display  ED Course/ Medical Decision Making/ A&P                                 Medical Decision Making Amount and/or Complexity of Data Reviewed Labs: ordered.   This patient presents to the ED for concern of hallucinations, this involves an extensive number of treatment options, and is a complaint that carries with it a high risk of complications and morbidity.  The differential diagnosis includes schizophrenia, infection, electrolyte abn   Co morbidities that complicate the patient evaluation  schizophrenia   Additional history obtained:  Additional history obtained from epic chart review  Lab Tests:  I Ordered, and personally interpreted labs.  The pertinent results include:  cbc nl, cmp nl    Medicines ordered and  prescription drug management:  I have reviewed the patients home medicines and have made adjustments as needed   Consultations Obtained:  I requested consultation with TTS,  and discussed lab and imaging findings as well as pertinent plan -consult pending   Problem List / ED Course:  Hallucinations:  pt said he has a hx of schizophrenia, but this is questionable.  Psych has seen him and done a chart review.  She thinks it is due to polysubstance abuse.  At this time, pt does not  meet criteria for inpatient.  He is stable for d/c.  Return if worse.    Reevaluation:  After the interventions noted above, I reevaluated the patient and found that they have :improved   Social Determinants of Health:  Lives at home   Dispostion:  home        Final Clinical Impression(s) / ED Diagnoses Final diagnoses:  Polysubstance abuse (HCC)  Hallucinations    Rx / DC Orders ED Discharge Orders     None         Jacalyn Lefevre, MD 05/04/23 2004    Jacalyn Lefevre, MD 05/04/23 2340

## 2023-05-04 NOTE — Consult Note (Signed)
[facility] Consult Note  Patient Name: Aaron Rubio MRN: [id number] DOB: 1981-04-18 DATE OF Consult: 05/04/2023  PRIMARY PSYCHIATRIC DIAGNOSES  1.  Unspecified induced psychosis 2.  R/o substance induced psychosis  3.  R/o secondary gain   RECOMMENDATIONS  Recommendations: Medication recommendations: None at this time Non-Medication/therapeutic recommendations: -Resources for shelter; Resources for outpatient therapy and psychiatry; Resources for outpatient substance use treatment; crisis line information; ED return precautions  Is inpatient psychiatric hospitalization recommended for this patient? No (Explain why): Denies suicidal and homicidal ideation, linear and goal directed, able to articulate a plan for self care Follow-Up Telepsychiatry C/L services: We will sign off for now. Please re-consult our service if needed for any concerning changes in the patient's condition, discharge planning, or questions. Communication: Treatment team members (and family members if applicable) who were involved in treatment/care discussions and planning, and with whom we spoke or engaged with via secure text/chat, include the following: Dr. Particia Nearing  Thank you for involving [place] in the care of this patient. If you have any additional questions or concerns, please call [phone] and ask for me or the provider on-call.  TELEPSYCHIATRY ATTESTATION & CONSENT  As the provider for this telehealth consult, I attest that I verified the patient's identity using two separate identifiers, introduced myself to the patient, provided my credentials, disclosed my location, and performed this encounter via a HIPAA-compliant, real-time, face-to-face, two-way, interactive audio and video platform and with the full consent and agreement of the patient (or guardian as applicable.)  Patient physical location: ED in [facility] provider physical location: home office in state of [place]    Video start time: 2309 EST Video end time: 2326 EST   IDENTIFYING DATA  Aaron Rubio is a 42 y.o. year-old male for whom a psychiatric consultation has been ordered by the primary provider. The patient was identified using two separate identifiers.  CHIEF COMPLAINT/REASON FOR CONSULT  Auditory hallucinations   HISTORY OF PRESENT ILLNESS (HPI)  Aaron Rubio is a 42 year old male, houseless, with a history of schizophrenia, substance induced psychosis, alcohol, opioid, and stimulant use disorders, and hypertension who presents to the ED endorsing auditory hallucinations. Per chart review, patient with frequent ED presentations  at multiple emergency rooms for pain and shelter seeking with escalation of behavior when his perceived needs are not met. No UDS or ethanol resulted. Psychiatry consulted for disposition recommendations.   On evaluation, patient mostly cooperative, becomes irritable with questioning and indicates he wants to sleep rather than participate, labile, linear, goal-directed, not appearing internally preoccupied, not responding to internal stimuli, alert and oriented x 4. Patient states he came to the ED because, "I'm hearing voices and talking to myself." Patient reports he has been living in a shelter but he learned that shelter is no longer letting people stay there so he came to the ED. Patient reports prior to ED presentation he was talking to himself "very loud and out of place." Patient states of the voices, "I'm being told what I am doing as I am doing it." Patient denies command auditory hallucinations. Patient states, "This has been going on but I am coming clean about it now." Patient does not know the reason he decided to present to the ED tonight. Patient denies paranoia, ideas of reference. Patient denies depressed mood, other depressive symptoms. Denies suicidal ideation, intent, and plan. Denies symptoms consistent with mania/hypomania, homicidal ideation. Patient  reports he has not been on medication in many months, does  not know exactly how long. Reports he was previously getting medication when he was in jail, uncertain what he was taking. Patient endorses the use of cocaine, last use today, states he uses daily, does not know how much he uses. Uses meth, does not know when he last used. States he uses Percocet that he buys on the street, does not know the amount he uses, reports he uses every few days. Reports he drinks 2-3 beers daily. Patient declines substance use treatment. States he thinks he can stay at a different shelter.     PAST PSYCHIATRIC HISTORY  Current psych meds: Denies  Prior psych meds: Haldol, cogentin  Outpatient: Denies currently  Inpatient: Once  Non-suicidal self injury: Denies  Suicide attempts: Denies Violence: Yes Drugs/alcohol: cocaine, last use today, states he uses daily, does not know how much he uses. Uses meth, does not know when he last used. Uses Percocet that he buys on the street, does not know the amount he uses, reports he uses every few days. Reports he drinks 2-3 beers daily.  Otherwise as per HPI above.  PAST MEDICAL HISTORY  Past Medical History:  Diagnosis Date   Hypertension    Schizophrenia [facility])      HOME MEDICATIONS  Facility Ordered Medications  Medication   [COMPLETED] ibuprofen (ADVIL) tablet 600 mg   PTA Medications  Medication Sig   benztropine (COGENTIN) 2 MG tablet Take 1 tablet (2 mg total) by mouth 2 (two) times daily. (Patient not taking: Reported on 05/04/2023)   hydrOXYzine (ATARAX) 25 MG tablet Take 1-2 tablets (25-50 mg total) by mouth See admin instructions. Take 25 mg by mouth in the morning and 50 mg at bedtime (Patient not taking: Reported on 05/04/2023)   metoprolol tartrate (LOPRESSOR) 25 MG tablet Take 1 tablet (25 mg total) by mouth in the morning. (Patient not taking: Reported on 05/04/2023)   cyclobenzaprine (FLEXERIL) 10 MG tablet Take 1 tablet (10 mg total) by mouth 2 (two)  times daily as needed for muscle spasms. (Patient not taking: Reported on 05/04/2023)   azithromycin (ZITHROMAX) 250 MG tablet Take 1 tablet (250 mg total) by mouth daily. (Patient not taking: Reported on 05/04/2023)   ondansetron (ZOFRAN-ODT) 4 MG disintegrating tablet Take 1 tablet (4 mg total) by mouth every 8 (eight) hours as needed for nausea or vomiting. (Patient not taking: Reported on 05/04/2023)   naproxen (NAPROSYN) 375 MG tablet Take 1 tablet (375 mg total) by mouth 2 (two) times daily. (Patient not taking: Reported on 05/04/2023)   benzonatate (TESSALON) 100 MG capsule Take 1 capsule (100 mg total) by mouth every 8 (eight) hours. (Patient not taking: Reported on 05/04/2023)     ALLERGIES  No Known Allergies  SOCIAL & SUBSTANCE USE HISTORY  Social History   Socioeconomic History   Marital status: Single    Spouse name: Not on file   Number of children: Not on file   Years of education: Not on file   Highest education level: Not on file  Occupational History   Not on file  Tobacco Use   Smoking status: Every Day    Current packs/day: 0.30    Types: Cigarettes   Smokeless tobacco: Not on file  Substance and Sexual Activity   Alcohol use: Not Currently   Drug use: Not Currently   Sexual activity: Not on file  Other Topics Concern   Not on file  Social History Narrative   Not on file   Social Determinants of Health  Financial Resource Strain: Not on file  Food Insecurity: Not on file  Transportation Needs: Not on file  Physical Activity: Not on file  Stress: Not on file  Social Connections: Unknown (05/08/2022)   Received from [facility]   Social Network    Social Network: Not on file   Social History   Tobacco Use  Smoking Status Every Day   Current packs/day: 0.30   Types: Cigarettes  Smokeless Tobacco Not on file   Social History   Substance and Sexual Activity  Alcohol Use Not Currently   Social History   Substance and Sexual Activity  Drug Use  Not Currently    Additional pertinent information  cocaine, last use today, states he uses daily, does not know how much he uses. Uses meth, does not know when he last used. States he uses Percocet that he buys on the street, does not know the amount he uses, reports he uses every few days. Reports he drinks 2-3 beers daily.   FAMILY HISTORY   Family Psychiatric History (if known):  Denies   MENTAL STATUS EXAM (MSE)  Presentation  General Appearance:  Appropriate for Environment  Eye Contact: Intermittent   Speech: Normal Rate  Speech Volume: Normal   Mood and Affect  Mood: Mildly irritable  Affect: Labile   Thought Process  Thought Processes: Coherent  Descriptions of Associations: Intact  Orientation: Full (Time, Place and Person)  Thought Content: Computation  History of Schizophrenia/Schizoaffective disorder: Yes Duration of Psychotic Symptoms: Unknown Hallucinations: Hallucinations: Auditory  Ideas of Reference: None  Suicidal Thoughts: Suicidal Thoughts: No  Homicidal Thoughts: Homicidal Thoughts: No   Sensorium  Memory: Immediate Good; Recent Good; Remote Good  Judgment: Fair  Insight: Fair   Art therapist  Concentration: Good  Attention Span: Good  Recall: Good  Fund of Knowledge: Fair  Language: Good   Psychomotor Activity  Psychomotor Activity: Psychomotor Activity: Normal   Assets  Assets: Communication Skills   VITALS  Blood pressure (!) 138/93, pulse 94, temperature 98.7 F (37.1 C), temperature source Oral, resp. rate 18, SpO2 99%.  LABS  Admission on 05/04/2023  Component Date Value Ref Range Status   Sodium 05/04/2023 136  135 - 145 mmol/L Final   Potassium 05/04/2023 3.7  3.5 - 5.1 mmol/L Final   Chloride 05/04/2023 98  98 - 111 mmol/L Final   CO2 05/04/2023 28  22 - 32 mmol/L Final   Glucose, Bld 05/04/2023 110 (H)  70 - 99 mg/dL Final   Glucose reference range applies only to samples  taken after fasting for at least 8 hours.   BUN 05/04/2023 18  6 - 20 mg/dL Final   Creatinine, Ser 05/04/2023 1.11  0.61 - 1.24 mg/dL Final   Calcium 16/04/9603 9.5  8.9 - 10.3 mg/dL Final   Total Protein 54/03/8118 6.1 (L)  6.5 - 8.1 g/dL Final   Albumin 14/78/2956 3.7  3.5 - 5.0 g/dL Final   AST 21/30/8657 59 (H)  15 - 41 U/L Final   ALT 05/04/2023 44  0 - 44 U/L Final   Alkaline Phosphatase 05/04/2023 55  38 - 126 U/L Final   Total Bilirubin 05/04/2023 0.6  0.3 - 1.2 mg/dL Final   GFR, Estimated 05/04/2023 >60  >60 mL/min Final   Comment: (NOTE) Calculated using the CKD-EPI Creatinine Equation (2021)    Anion gap 05/04/2023 10  5 - 15 Final   Performed at [facility], [address] [place], [place] [address]  Alcohol, Ethyl (B) 05/04/2023 <10  <10 mg/dL Final   Comment: (NOTE) Lowest detectable limit for serum alcohol is 10 mg/dL.  For medical purposes only. Performed at [facility], [address] [place], [place] [address]    WBC 05/04/2023 7.8  4.0 - 10.5 K/uL Final   RBC 05/04/2023 4.61  4.22 - 5.81 MIL/uL Final   Hemoglobin 05/04/2023 12.6 (L)  13.0 - 17.0 g/dL Final   HCT 13/24/4010 40.7  39.0 - 52.0 % Final   MCV 05/04/2023 88.3  80.0 - 100.0 fL Final   MCH 05/04/2023 27.3  26.0 - 34.0 pg Final   MCHC 05/04/2023 31.0  30.0 - 36.0 g/dL Final   RDW 27/25/3664 12.6  11.5 - 15.5 % Final   Platelets 05/04/2023 226  150 - 400 K/uL Final   nRBC 05/04/2023 0.0  0.0 - 0.2 % Final   Neutrophils Relative % 05/04/2023 65  % Final   Neutro Abs 05/04/2023 5.1  1.7 - 7.7 K/uL Final   Lymphocytes Relative 05/04/2023 19  % Final   Lymphs Abs 05/04/2023 1.5  0.7 - 4.0 K/uL Final   Monocytes Relative 05/04/2023 8  % Final   Monocytes Absolute 05/04/2023 0.6  0.1 - 1.0 K/uL Final   Eosinophils Relative 05/04/2023 6  % Final   Eosinophils Absolute 05/04/2023 0.5  0.0 - 0.5 K/uL Final   Basophils Relative 05/04/2023 1  % Final   Basophils  Absolute 05/04/2023 0.1  0.0 - 0.1 K/uL Final   Immature Granulocytes 05/04/2023 1  % Final   Abs Immature Granulocytes 05/04/2023 0.04  0.00 - 0.07 K/uL Final   Performed at [facility], [address] [place], [place] [address]  Admission on 05/03/2023, Discharged on 05/04/2023  Component Date Value Ref Range Status   SARS Coronavirus 2 by RT PCR 05/03/2023 NEGATIVE  NEGATIVE Final   Performed at [facility], [address], [place], [place] [address]    PSYCHIATRIC REVIEW OF SYSTEMS (ROS)  ROS: Notable for the following relevant positive findings: Review of Systems  Psychiatric/Behavioral:  Positive for hallucinations and substance abuse.     Additional findings:      Musculoskeletal: No abnormal movements observed      Gait & Station: Laying/Sitting      Pain Screening: Denies      Nutrition & Dental Concerns: None  RISK FORMULATION/ASSESSMENT  Is the patient experiencing any suicidal or homicidal ideations: No       Explain if yes: Denies suicidal and homicidal ideation  Protective factors considered for safety management: Help seeking, ability to access resources, future oriented, identifies reasons to live   Risk factors/concerns considered for safety management:  Substance abuse/dependence Male gender Unmarried  Is there a safety management plan with the patient and treatment team to minimize risk factors and promote protective factors: Yes           Explain: Resources for shelter; resources for outpatient therapy and psychiatry; resources for outpatient substance use treatment; crisis line information; ED return precautions  Is crisis care placement or psychiatric hospitalization recommended: No     Based on my current evaluation and risk assessment, patient is determined at this time to be at:  Low risk  *RISK ASSESSMENT Risk assessment is a dynamic process; it is possible that this patient's condition, and risk level, may change. This  should be re-evaluated and managed over time as appropriate. Please re-consult psychiatric consult services if additional assistance is  needed in terms of risk assessment and management. If your team decides to discharge this patient, please advise the patient how to best access emergency psychiatric services, or to call 911, if their condition worsens or they feel unsafe in any way.  Aaron Rubio is a 42 year old male, houseless, with a history of schizophrenia, substance induced psychosis, alcohol, opioid, and stimulant use disorders, and hypertension who presents to the ED endorsing auditory hallucinations. Per chart review, patient with frequent ED presentations  at multiple emergency rooms for pain and shelter seeking with escalation of behavior when his perceived needs are not met. No UDS or ethanol resulted. Psychiatry consulted for disposition recommendations. On evaluation, patient mostly cooperative, becomes irritable with questioning and indicates he wants to sleep rather than participate, labile, linear, goal-directed, not appearing internally preoccupied, not responding to internal stimuli, alert and oriented x 4. Patient endorses auditory hallucinations and noticing himself responding to internal stimuli, which notably he is not doing on exam. Patient denies command hallucinations. Denies symptoms consistent with depression, mania/hypomania. Denies paranoia, ideas of reference. Denies suicidal and homicidal ideation. Patient endorses daily cocaine use, intermittent meth use, Percocet use every few days, alcohol use daily. Patient declines substance use treatment. States he thinks he can stay at a different shelter. Patient's presentation is concerning for unspecified psychosis, rule out substance induced psychosis, rule out secondary gain. Patient with report of auditory hallucinations, no obvious signs hallucinations, linear, organized, not appearing internally preoccupied or responding and without  other affective or psychotic symptoms, suspect contribution to presentation from substance use and secondary gain, but cannot rule out primary psychotic illness. Patient denies suicidal ideation, intent, and plan. Patient currently low risk for suicide due to protective factors including help seeking, ability to access resources, future oriented, identifies reasons to live. Therefore, inpatient psychiatric hospitalization is not recommended.    Adria Dill, MD Telepsychiatry Consult Services

## 2023-05-05 ENCOUNTER — Emergency Department (HOSPITAL_COMMUNITY)
Admission: EM | Admit: 2023-05-05 | Discharge: 2023-05-05 | Disposition: A | Discharge status: Home / Self Care | Payer: Medicaid Other | Attending: Emergency Medicine | Admitting: Emergency Medicine

## 2023-05-05 ENCOUNTER — Encounter (HOSPITAL_COMMUNITY): Payer: Self-pay

## 2023-05-05 ENCOUNTER — Other Ambulatory Visit: Payer: Self-pay

## 2023-05-05 DIAGNOSIS — R45851 Suicidal ideations: Secondary | ICD-10-CM

## 2023-05-05 DIAGNOSIS — F19959 Other psychoactive substance use, unspecified with psychoactive substance-induced psychotic disorder, unspecified: Secondary | ICD-10-CM | POA: Diagnosis present

## 2023-05-05 DIAGNOSIS — F29 Unspecified psychosis not due to a substance or known physiological condition: Secondary | ICD-10-CM

## 2023-05-05 DIAGNOSIS — F1910 Other psychoactive substance abuse, uncomplicated: Secondary | ICD-10-CM

## 2023-05-05 NOTE — ED Notes (Signed)
Pollina MD at bedside. 

## 2023-05-05 NOTE — ED Provider Notes (Signed)
[place] EMERGENCY DEPARTMENT AT [facility] Provider Note   CSN: [id number] Arrival date & time: 05/05/23  0153     History  Chief Complaint  Patient presents with   Psychiatric Evaluation    Aaron Rubio is a 42 y.o. male.  Return to the emergency department stating "I am going through something".  Patient reports "I do not feel safe".  Patient reports that he is feeling suicidal, has attempted suicide in the past.       Home Medications Prior to Admission medications   Medication Sig Start Date End Date Taking? Authorizing Provider  azithromycin (ZITHROMAX) 250 MG tablet Take 1 tablet (250 mg total) by mouth daily. Patient not taking: Reported on 05/04/2023 06/16/22   Pricilla Loveless, MD  benzonatate (TESSALON) 100 MG capsule Take 1 capsule (100 mg total) by mouth every 8 (eight) hours. Patient not taking: Reported on 05/04/2023 05/03/23   Darrick Grinder, PA-C  benztropine (COGENTIN) 2 MG tablet Take 1 tablet (2 mg total) by mouth 2 (two) times daily. Patient not taking: Reported on 05/04/2023 07/15/21 05/04/23  Long, Arlyss Repress, MD  cyclobenzaprine (FLEXERIL) 10 MG tablet Take 1 tablet (10 mg total) by mouth 2 (two) times daily as needed for muscle spasms. Patient not taking: Reported on 05/04/2023 06/04/22   Tegeler, Canary Brim, MD  hydrOXYzine (ATARAX) 25 MG tablet Take 1-2 tablets (25-50 mg total) by mouth See admin instructions. Take 25 mg by mouth in the morning and 50 mg at bedtime Patient not taking: Reported on 05/04/2023 07/15/21   Long, Arlyss Repress, MD  metoprolol tartrate (LOPRESSOR) 25 MG tablet Take 1 tablet (25 mg total) by mouth in the morning. Patient not taking: Reported on 05/04/2023 07/15/21 05/04/23  Maia Plan, MD  naproxen (NAPROSYN) 375 MG tablet Take 1 tablet (375 mg total) by mouth 2 (two) times daily. Patient not taking: Reported on 05/04/2023 04/29/23   Horton, Danford Bad M, DO  ondansetron (ZOFRAN-ODT) 4 MG disintegrating tablet Take 1  tablet (4 mg total) by mouth every 8 (eight) hours as needed for nausea or vomiting. Patient not taking: Reported on 05/04/2023 06/15/22   Pricilla Loveless, MD  TYLENOL 500 MG tablet Take 500-1,000 mg by mouth every 6 (six) hours as needed for mild pain (pain score 1-3) or headache.    [provider]      Allergies    Patient has no known allergies.    Review of Systems   Review of Systems  Physical Exam Updated Vital Signs BP (!) 136/97 (BP Location: Right Arm)   Pulse 93   Temp 98.1 F (36.7 C)   Resp 16   SpO2 99%  Physical Exam Vitals and nursing note reviewed.  Constitutional:      General: He is not in acute distress.    Appearance: He is well-developed.  HENT:     Head: Normocephalic and atraumatic.     Mouth/Throat:     Mouth: Mucous membranes are moist.  Eyes:     General: Vision grossly intact. Gaze aligned appropriately.     Extraocular Movements: Extraocular movements intact.     Conjunctiva/sclera: Conjunctivae normal.  Cardiovascular:     Rate and Rhythm: Normal rate and regular rhythm.     Pulses: Normal pulses.     Heart sounds: Normal heart sounds, S1 normal and S2 normal. No murmur heard.    No friction rub. No gallop.  Pulmonary:     Effort: Pulmonary effort is normal. No  respiratory distress.     Breath sounds: Normal breath sounds.  Abdominal:     Palpations: Abdomen is soft.     Tenderness: There is no abdominal tenderness. There is no guarding or rebound.     Hernia: No hernia is present.  Musculoskeletal:        General: No swelling.     Cervical back: Full passive range of motion without pain, normal range of motion and neck supple. No pain with movement, spinous process tenderness or muscular tenderness. Normal range of motion.     Right lower leg: No edema.     Left lower leg: No edema.  Skin:    General: Skin is warm and dry.     Capillary Refill: Capillary refill takes less than 2 seconds.     Findings: No ecchymosis, erythema,  lesion or wound.  Neurological:     Mental Status: He is alert and oriented to person, place, and time.     GCS: GCS eye subscore is 4. GCS verbal subscore is 5. GCS motor subscore is 6.     Cranial Nerves: Cranial nerves 2-12 are intact.     Sensory: Sensation is intact.     Motor: Motor function is intact. No weakness or abnormal muscle tone.     Coordination: Coordination is intact.  Psychiatric:        Mood and Affect: Mood normal.        Speech: Speech normal.        Behavior: Behavior normal.        Thought Content: Thought content includes suicidal ideation.     ED Results / Procedures / Treatments   Labs (all labs ordered are listed, but only abnormal results are displayed) Labs Reviewed  CBC WITH DIFFERENTIAL/PLATELET  COMPREHENSIVE METABOLIC PANEL  ETHANOL  RAPID URINE DRUG SCREEN, HOSP PERFORMED    EKG None  Radiology DG Chest 2 View  Result Date: 05/03/2023 CLINICAL DATA:  Cough, shortness of breath and left-sided chest pain x 14 hours. EXAM: CHEST - 2 VIEW COMPARISON:  June 15, 2022 FINDINGS: The heart size and mediastinal contours are within normal limits. Both lungs are clear. The visualized skeletal structures are unremarkable. IMPRESSION: No active cardiopulmonary disease. Electronically Signed   By: Aram Candela M.D.   On: 05/03/2023 22:43    Procedures Procedures    Medications Ordered in ED Medications - No data to display  ED Course/ Medical Decision Making/ A&P                                 Medical Decision Making Amount and/or Complexity of Data Reviewed Labs: ordered.   Patient presents with thoughts of harming himself.  He reports that he is feeling suicidal and has attempted suicide in the past.  Patient with auditory hallucinations, seen at [facility] yesterday for same.  Patient reports that he does not feel safe.        Final Clinical Impression(s) / ED Diagnoses Final diagnoses:  Suicidal ideation    Rx / DC  Orders ED Discharge Orders     None         Leyland Kenna, Canary Brim, MD 05/05/23 [phone]

## 2023-05-05 NOTE — ED Notes (Signed)
Pt provided with discharge instructions and belongings.  Pt asking about breakfast tray, will allow pt to wait for breakfast before d/c.

## 2023-05-05 NOTE — Consult Note (Signed)
[facility] Consult Note  Patient Name: Aaron Rubio MRN: [id number] DOB: [birth year] DATE OF Consult: 05/05/2023  PRIMARY PSYCHIATRIC DIAGNOSES  1.  Unspecified induced psychosis 2.  R/o substance induced psychosis  3.  R/o secondary gain  4, Passive SI without a plan or intent  RECOMMENDATIONS  Patient's current condition does not meet the criteria for psychiatric admission.  Non-Medication/therapeutic recommendations: Initiate referral outpatient mental health and substance abuse treatment.  Provider the patient with resources for outpatient services, crisis line and shelter options.  Communication: Treatment team members (and family members if applicable) who were involved in treatment/care discussions and planning, and with whom we spoke or engaged with via secure text/chat, include the following:  treatment team Delford Field RN, [place] RN, Blinda Leatherwood MD, Marylene Land [facility])  Thank you for involving [place] in the care of this patient. If you have any additional questions or concerns, please call [phone] and ask for me or the provider on-call.  TELEPSYCHIATRY ATTESTATION & CONSENT  As the provider for this telehealth consult, I attest that I verified the patient's identity using two separate identifiers, introduced myself to the patient, provided my credentials, disclosed my location, and performed this encounter via a HIPAA-compliant, real-time, face-to-face, two-way, interactive audio and video platform and with the full consent and agreement of the patient (or guardian as applicable.)  Patient physical location: [facility]. Telehealth provider physical location: home office in state of [place].  Video start time: 0523 (Central Time) Video end time: 0535 (Central Time)  IDENTIFYING DATA  Aaron Rubio is a 42 y.o. year-old male for whom a psychiatric consultation has been ordered by the primary provider. The patient was identified using two separate identifiers.  CHIEF  COMPLAINT/REASON FOR CONSULT  Experiencing that life is not important due to condition Decisions being made by unknown people Feeling lost and seeking help Expressing thoughts of self-harm and not liking living anymore but does not report having a plan or intent. Feeling threatened by others forcing their ways upon him  HISTORY OF PRESENT ILLNESS (HPI)  Per ER notes, Return to the emergency department stating "I am going through something".  Patient reports "I do not feel safe".  Patient reports that he is feeling suicidal, has attempted suicide in the past.   Patient is a 42 year old male who presented with complaints of feeling that his life is not important and experiencing a sense of loss of control over decisions being made by others. He described a pervasive sense of being surrounded by people he does not recognize, which has led to significant distress. The patient expressed feelings of wanting to harm himself due to the overwhelming nature of these experiences and a belief that he is a threat to others because of his inability to control these external influences.   The patient reported a history of schizophrenia and paranoia, stating that he has been trying to manage these conditions throughout his life but feels that his efforts are no longer effective. Despite these claims, he did not exhibit signs of acute psychosis, such as responding to internal stimuli or disorganized behavior, during the evaluation.   The patient was recently discharged from [facility] ER earlier tonight and is currently seeking further assistance at [facility]. He expressed frustration with the care he received at previous facilities, feeling that his concerns were not adequately addressed. He mentioned a desire for help but was unable to clearly articulate the specific type of assistance he needs, often speaking in a circular and evasive  manner.   It was noted that the patient appears to be homeless and has been  moving between emergency rooms seeking help. He denied being comfortable with the idea of staying in a shelter, expressing a preference for a more stable living situation. He reports having passive suicidal ideations but denies having plan or intent. Despite his claims of schizophrenia and paranoia, his presentation did not align with acute psychiatric symptoms that would necessitate inpatient admission.   PAST PSYCHIATRIC HISTORY  Patient reports having a history of schizophrenia and paranoia. He mentioned being previously treated at [facility] and [facility]. Has a history of being prescribed Haldol, Cogentin Per chart review, has no history of suicide attempt Has a history of using Percocet, cocaine and methamphetamines He was recently discharged from [facility] ER before presenting at [facility].  PAST MEDICAL HISTORY  Past Medical History:  Diagnosis Date   Hypertension    Schizophrenia (HCC)      HOME MEDICATIONS  PTA Medications  Medication Sig   benztropine (COGENTIN) 2 MG tablet Take 1 tablet (2 mg total) by mouth 2 (two) times daily. (Patient not taking: Reported on 05/04/2023)   hydrOXYzine (ATARAX) 25 MG tablet Take 1-2 tablets (25-50 mg total) by mouth See admin instructions. Take 25 mg by mouth in the morning and 50 mg at bedtime (Patient not taking: Reported on 05/04/2023)   metoprolol tartrate (LOPRESSOR) 25 MG tablet Take 1 tablet (25 mg total) by mouth in the morning. (Patient not taking: Reported on 05/04/2023)   cyclobenzaprine (FLEXERIL) 10 MG tablet Take 1 tablet (10 mg total) by mouth 2 (two) times daily as needed for muscle spasms. (Patient not taking: Reported on 05/04/2023)   azithromycin (ZITHROMAX) 250 MG tablet Take 1 tablet (250 mg total) by mouth daily. (Patient not taking: Reported on 05/04/2023)   ondansetron (ZOFRAN-ODT) 4 MG disintegrating tablet Take 1 tablet (4 mg total) by mouth every 8 (eight) hours as needed for nausea or vomiting. (Patient  not taking: Reported on 05/04/2023)   naproxen (NAPROSYN) 375 MG tablet Take 1 tablet (375 mg total) by mouth 2 (two) times daily. (Patient not taking: Reported on 05/04/2023)   benzonatate (TESSALON) 100 MG capsule Take 1 capsule (100 mg total) by mouth every 8 (eight) hours. (Patient not taking: Reported on 05/04/2023)   TYLENOL 500 MG tablet Take 500-1,000 mg by mouth every 6 (six) hours as needed for mild pain (pain score 1-3) or headache.     ALLERGIES  No Known Allergies  SOCIAL & SUBSTANCE USE HISTORY  Social History   Socioeconomic History   Marital status: Single    Spouse name: Not on file   Number of children: Not on file   Years of education: Not on file   Highest education level: Not on file  Occupational History   Not on file  Tobacco Use   Smoking status: Every Day    Current packs/day: 0.30    Types: Cigarettes   Smokeless tobacco: Not on file  Substance and Sexual Activity   Alcohol use: Not Currently   Drug use: Not Currently   Sexual activity: Not on file  Other Topics Concern   Not on file  Social History Narrative   Not on file   Social Determinants of Health   Financial Resource Strain: Not on file  Food Insecurity: Not on file  Transportation Needs: Not on file  Physical Activity: Not on file  Stress: Not on file  Social Connections: Unknown (05/08/2022)  Received from [facility]   Social Network    Social Network: Not on file   Social History   Tobacco Use  Smoking Status Every Day   Current packs/day: 0.30   Types: Cigarettes  Smokeless Tobacco Not on file   Social History   Substance and Sexual Activity  Alcohol Use Not Currently   Social History   Substance and Sexual Activity  Drug Use Not Currently    Additional pertinent information Per chart review, uses cocaine daily, uses meth and percocet off the street  FAMILY HISTORY  History reviewed. No pertinent family history. Family Psychiatric History (if known):  per chart  review none  MENTAL STATUS EXAM (MSE)  Presentation  General Appearance:  Appropriate for Environment  Eye Contact: Fleeting  Speech: Clear and Coherent  Speech Volume: Normal  Handedness:No data recorded  Mood and Affect  Mood: Irritable  Affect: Congruent   Thought Process  Thought Processes: Coherent  Descriptions of Associations: Intact  Orientation: Full (Time, Place and Person)  Thought Content: Logical  History of Schizophrenia/Schizoaffective disorder:No data recorded Duration of Psychotic Symptoms:No data recorded Hallucinations: Hallucinations: None  Ideas of Reference: -- (reports paranoia)  Suicidal Thoughts: Suicidal Thoughts: Yes, Passive SI Passive Intent and/or Plan: Without Intent; Without Plan  Homicidal Thoughts: Homicidal Thoughts: No   Sensorium  Memory: Immediate Good; Recent Good; Remote Good  Judgment: Fair  Insight: Fair   Art therapist  Concentration: Good  Attention Span: Good  Recall: Good  Fund of Knowledge: Fair  Language: Good   Psychomotor Activity  Psychomotor Activity: Psychomotor Activity: Normal   Assets  Assets: Communication Skills   Sleep  Sleep:No data recorded  VITALS  Blood pressure (!) 136/97, pulse 93, temperature 98.1 F (36.7 C), resp. rate 16, SpO2 99%.  LABS  Admission on 05/04/2023, Discharged on 05/05/2023  Component Date Value Ref Range Status   Sodium 05/04/2023 136  135 - 145 mmol/L Final   Potassium 05/04/2023 3.7  3.5 - 5.1 mmol/L Final   Chloride 05/04/2023 98  98 - 111 mmol/L Final   CO2 05/04/2023 28  22 - 32 mmol/L Final   Glucose, Bld 05/04/2023 110 (H)  70 - 99 mg/dL Final   Glucose reference range applies only to samples taken after fasting for at least 8 hours.   BUN 05/04/2023 18  6 - 20 mg/dL Final   Creatinine, Ser 05/04/2023 1.11  0.61 - 1.24 mg/dL Final   Calcium 16/04/9603 9.5  8.9 - 10.3 mg/dL Final   Total Protein 54/03/8118 6.1 (L)   6.5 - 8.1 g/dL Final   Albumin 14/78/2956 3.7  3.5 - 5.0 g/dL Final   AST 21/30/8657 59 (H)  15 - 41 U/L Final   ALT 05/04/2023 44  0 - 44 U/L Final   Alkaline Phosphatase 05/04/2023 55  38 - 126 U/L Final   Total Bilirubin 05/04/2023 0.6  0.3 - 1.2 mg/dL Final   GFR, Estimated 05/04/2023 >60  >60 mL/min Final   Comment: (NOTE) Calculated using the CKD-EPI Creatinine Equation (2021)    Anion gap 05/04/2023 10  5 - 15 Final   Performed at [facility], [address], [place], [place] [address]   Alcohol, Ethyl (B) 05/04/2023 <10  <10 mg/dL Final   Comment: (NOTE) Lowest detectable limit for serum alcohol is 10 mg/dL.  For medical purposes only. Performed at [facility], [address], [place], [place] [address]    WBC 05/04/2023 7.8  4.0 -  10.5 K/uL Final   RBC 05/04/2023 4.61  4.22 - 5.81 MIL/uL Final   Hemoglobin 05/04/2023 12.6 (L)  13.0 - 17.0 g/dL Final   HCT 16/04/9603 40.7  39.0 - 52.0 % Final   MCV 05/04/2023 88.3  80.0 - 100.0 fL Final   MCH 05/04/2023 27.3  26.0 - 34.0 pg Final   MCHC 05/04/2023 31.0  30.0 - 36.0 g/dL Final   RDW 54/03/8118 12.6  11.5 - 15.5 % Final   Platelets 05/04/2023 226  150 - 400 K/uL Final   nRBC 05/04/2023 0.0  0.0 - 0.2 % Final   Neutrophils Relative % 05/04/2023 65  % Final   Neutro Abs 05/04/2023 5.1  1.7 - 7.7 K/uL Final   Lymphocytes Relative 05/04/2023 19  % Final   Lymphs Abs 05/04/2023 1.5  0.7 - 4.0 K/uL Final   Monocytes Relative 05/04/2023 8  % Final   Monocytes Absolute 05/04/2023 0.6  0.1 - 1.0 K/uL Final   Eosinophils Relative 05/04/2023 6  % Final   Eosinophils Absolute 05/04/2023 0.5  0.0 - 0.5 K/uL Final   Basophils Relative 05/04/2023 1  % Final   Basophils Absolute 05/04/2023 0.1  0.0 - 0.1 K/uL Final   Immature Granulocytes 05/04/2023 1  % Final   Abs Immature Granulocytes 05/04/2023 0.04  0.00 - 0.07 K/uL Final   Performed at [facility], [address]  [place], [place] [address]    PSYCHIATRIC REVIEW OF SYSTEMS (ROS)  Psychiatric: reports paranoia,  Reports passive suicidal ideation without plan or intent  Additional findings:      Musculoskeletal: No abnormal movements observed      Gait & Station: Laying/Sitting      Pain Screening: Denies      Nutrition & Dental Concerns:  none reported  RISK FORMULATION/ASSESSMENT  Is the patient experiencing any suicidal or homicidal ideations: Yes       Explain if yes: Reports passive suicidal ideation without plan or intent  Protective factors considered for safety management: access to appropriate clinical interventions, ability to seek help, able to communicate needs.  Risk factors/concerns considered for safety management:  Substance abuse/dependence Male gender Unmarried  Is there a safety management plan with the patient and treatment team to minimize risk factors and promote protective factors: Yes           Explain: outpatient services, resources for shelter, crisis line Is crisis care placement or psychiatric hospitalization recommended: No     Based on my current evaluation and risk assessment, patient is determined at this time to be at:  Moderate Risk  *RISK ASSESSMENT Risk assessment is a dynamic process; it is possible that this patient's condition, and risk level, may change. This should be re-evaluated and managed over time as appropriate. Please re-consult psychiatric consult services if additional assistance is needed in terms of risk assessment and management. If your team decides to discharge this patient, please advise the patient how to best access emergency psychiatric services, or to call 911, if their condition worsens or they feel unsafe in any way.  The patient is a 42 year old male who is currently unemployed and homeless experiencing distress due to his condition. He mentioned a history of paranoia and feeling threatened by others. Despite his claims of schizophrenia and  paranoia, his presentation did not align with acute psychiatric symptoms that would necessitate inpatient admission. He also has a history of substance use, specifically cocaine, meth and Percocet. The patient expressed passive SI but  reports no plan or intent. Recommendations includes providing the patient with resources for outpatient services and shelter options. Patient's current condition does not meet the criteria for psychiatric admission, and outpatient care would be more appropriate for his needs.   Norval Morton, NP Telepsychiatry Consult Services

## 2023-05-05 NOTE — ED Triage Notes (Signed)
Pt presents via EMS c/o suicidal ideations. Reports having auditory hallucinations. Pt Calm and cooperative at this time. Reports evaluated at [facility] yesterday and wanting a 2nd opinion. Pt A&O x4. Ambulatory to triage.

## 2023-05-05 NOTE — ED Notes (Signed)
Pt dressed in hospital attire, belongings collected, and wanded by security.

## 2023-05-05 NOTE — ED Provider Notes (Signed)
Behavioral health evaluation has been reviewed.  Patient felt to be safe for discharge.   Gilda Crease, MD 05/05/23 [phone]

## 2023-05-05 NOTE — BH Assessment (Signed)
Clinician spoke to [facility] to complete TTS assessment. Clinician provided pt's name MRN, location, age, provider name and room number. Secure message will be sent for the time of the assessment. Secure message completed.     Redmond Pulling, MS, [facility], [facility] Triage Specialist

## 2023-05-06 ENCOUNTER — Emergency Department (HOSPITAL_COMMUNITY): Payer: Medicaid Other

## 2023-05-06 ENCOUNTER — Other Ambulatory Visit: Payer: Self-pay

## 2023-05-06 ENCOUNTER — Encounter (HOSPITAL_COMMUNITY): Payer: Self-pay | Admitting: *Deleted

## 2023-05-06 ENCOUNTER — Emergency Department (HOSPITAL_COMMUNITY)
Admission: EM | Admit: 2023-05-06 | Discharge: 2023-05-07 | Disposition: A | Discharge status: Home / Self Care | Payer: Medicaid Other | Attending: Emergency Medicine | Admitting: Emergency Medicine

## 2023-05-06 DIAGNOSIS — R059 Cough, unspecified: Secondary | ICD-10-CM | POA: Diagnosis present

## 2023-05-06 DIAGNOSIS — F17210 Nicotine dependence, cigarettes, uncomplicated: Secondary | ICD-10-CM | POA: Diagnosis not present

## 2023-05-06 DIAGNOSIS — I10 Essential (primary) hypertension: Secondary | ICD-10-CM | POA: Diagnosis not present

## 2023-05-06 DIAGNOSIS — J069 Acute upper respiratory infection, unspecified: Secondary | ICD-10-CM | POA: Insufficient documentation

## 2023-05-06 NOTE — ED Triage Notes (Signed)
The pt has had a cough for one week and he has nasal drainage. No temp

## 2023-05-07 MED ORDER — NAPROXEN 375 MG PO TABS: 375.0000 mg | ORAL_TABLET | Freq: Two times a day (BID) | ORAL | 0 refills | Status: DC

## 2023-05-07 MED ORDER — METOPROLOL TARTRATE 25 MG PO TABS: 25.0000 mg | ORAL_TABLET | Freq: Every morning | ORAL | 0 refills | Status: DC

## 2023-05-07 NOTE — ED Provider Notes (Signed)
[facility] Emergency Department Provider Note MRN:  [id number]  Arrival date & time: 05/07/23     Chief Complaint   Cough   History of Present Illness   Aaron Rubio is a 42 y.o. year-old male with a history of hypertension, schizophrenia presenting to the ED with chief complaint of cough.  Cough and stuffy nose and runny nose for 2 or 3 days.  Blood pressure has been high, has not been taking his medicines.  No shortness of breath, no other complaints.  Review of Systems  A thorough review of systems was obtained and all systems are negative except as noted in the HPI and PMH.   Patient's Health History    Past Medical History:  Diagnosis Date   Hypertension    Schizophrenia (HCC)     History reviewed. No pertinent surgical history.  History reviewed. No pertinent family history.  Social History   Socioeconomic History   Marital status: Single    Spouse name: Not on file   Number of children: Not on file   Years of education: Not on file   Highest education level: Not on file  Occupational History   Not on file  Tobacco Use   Smoking status: Every Day    Current packs/day: 0.30    Types: Cigarettes   Smokeless tobacco: Not on file  Substance and Sexual Activity   Alcohol use: Not Currently   Drug use: Not Currently   Sexual activity: Not on file  Other Topics Concern   Not on file  Social History Narrative   Not on file   Social Determinants of Health   Financial Resource Strain: Not on file  Food Insecurity: Not on file  Transportation Needs: Not on file  Physical Activity: Not on file  Stress: Not on file  Social Connections: Unknown (05/08/2022)   Received from [facility]   Social Network    Social Network: Not on file  Intimate Partner Violence: Unknown (05/08/2022)   Received from [facility]   HITS    Physically Hurt: Not on file    Insult or Talk Down To: Not on file    Threaten Physical Harm: Not on file     Scream or Curse: Not on file     Physical Exam   Vitals:   05/06/23 2300  BP: (!) 178/106  Pulse: 90  Resp: 16  Temp: 97.7 F (36.5 C)  SpO2: 100%    CONSTITUTIONAL: Well-appearing, NAD NEURO/PSYCH:  Alert and oriented x 3, no focal deficits EYES:  eyes equal and reactive ENT/NECK:  no LAD, no JVD CARDIO: Regular rate, well-perfused, normal S1 and S2 PULM:  CTAB no wheezing or rhonchi GI/GU:  non-distended, non-tender MSK/SPINE:  No gross deformities, no edema SKIN:  no rash, atraumatic   *Additional and/or pertinent findings included in MDM below  Diagnostic and Interventional Summary    EKG Interpretation Date/Time:    Ventricular Rate:    PR Interval:    QRS Duration:    QT Interval:    QTC Calculation:   R Axis:      Text Interpretation:         Labs Reviewed - No data to display  DG Chest 2 View  Final Result      Medications - No data to display   Procedures  /  Critical Care Procedures  ED Course and Medical Decision Making  Initial Impression and Ddx Patient is resting comfortably in no acute distress,  hypertensive but otherwise reassuring vital signs.  Not having any chest pain or shortness of breath, no leg pain or swelling, symptoms seem most suggestive of a URI.  Right nasal turbinates are a bit inflamed.  Has some mild sore throat but the oropharynx appears normal.  Lungs are clear.  Past medical/surgical history that increases complexity of ED encounter: Schizophrenia, frequent ED visitor  Interpretation of Diagnostics I personally reviewed the Chest Xray and my interpretation is as follows: No lobar opacity or pneumothorax    Patient Reassessment and Ultimate Disposition/Management     Discharge  Patient management required discussion with the following services or consulting groups:  None  Complexity of Problems Addressed Acute complicated illness or Injury  Additional Data Reviewed and Analyzed Further history obtained  from: Prior ED visit notes and Prior labs/imaging results  Additional Factors Impacting ED Encounter Risk Prescriptions  Elmer Sow. Pilar Plate, MD [facility] Emergency Medicine [facility] [email or web address]  Final Clinical Impressions(s) / ED Diagnoses     ICD-10-CM   1. Upper respiratory tract infection, unspecified type  J06.9       ED Discharge Orders          Ordered    naproxen (NAPROSYN) 375 MG tablet  2 times daily        05/07/23 0055    metoprolol tartrate (LOPRESSOR) 25 MG tablet  Every morning        05/07/23 0055             Discharge Instructions Discussed with and Provided to Patient:    Discharge Instructions      You were evaluated in the Emergency Department and after careful evaluation, we did not find any emergent condition requiring admission or further testing in the hospital.  Your exam/testing today was overall reassuring.  Symptoms likely due to an upper respiratory infection.  Take the Naprosyn anti-inflammatory twice daily for discomfort.  Restart your metoprolol.  Please return to the Emergency Department if you experience any worsening of your condition.  Thank you for allowing [place] to be a part of your care.       Sabas Sous, MD 05/07/23 [phone]

## 2023-05-07 NOTE — Discharge Instructions (Signed)
You were evaluated in the Emergency Department and after careful evaluation, we did not find any emergent condition requiring admission or further testing in the hospital.  Your exam/testing today was overall reassuring.  Symptoms likely due to an upper respiratory infection.  Take the Naprosyn anti-inflammatory twice daily for discomfort.  Restart your metoprolol.  Please return to the Emergency Department if you experience any worsening of your condition.  Thank you for allowing [place] to be a part of your care.

## 2023-05-07 NOTE — ED Notes (Signed)
Attempted to discharge pt, pt denied to get discharged stated that provider had told him he needed abx, pt has been informed by EDP that he does not need abx and his chest xray came back normal. Pt has prescription of naproxen and blood pressure medication that he can pick up at his pharmacy. Pt stated that he needs to be seen by a provider in regarding about his rash on his face and in his nose, pt did not have a rash on his face that this RN could see. EDP made aware. Another RN attempted to discharge pt, and he was discharged in a wheel chair, but pt is A/Ox4 and ambulatory since he was able to ambulate to the bathroom.

## 2023-05-09 ENCOUNTER — Emergency Department (HOSPITAL_COMMUNITY)
Admission: EM | Admit: 2023-05-09 | Discharge: 2023-05-09 | Disposition: A | Discharge status: Home / Self Care | Payer: Medicaid Other | Attending: Emergency Medicine | Admitting: Emergency Medicine

## 2023-05-09 ENCOUNTER — Other Ambulatory Visit: Payer: Self-pay

## 2023-05-09 DIAGNOSIS — M545 Low back pain, unspecified: Secondary | ICD-10-CM | POA: Diagnosis present

## 2023-05-09 DIAGNOSIS — I10 Essential (primary) hypertension: Secondary | ICD-10-CM | POA: Insufficient documentation

## 2023-05-09 DIAGNOSIS — Z79899 Other long term (current) drug therapy: Secondary | ICD-10-CM | POA: Insufficient documentation

## 2023-05-09 MED ORDER — IBUPROFEN 400 MG PO TABS
600.0000 mg | ORAL_TABLET | Freq: Once | ORAL | Status: AC
  Administered 2023-05-09: 600 mg via ORAL
  Filled 2023-05-09: qty 1

## 2023-05-09 MED ORDER — NAPROXEN 500 MG PO TABS: 500.0000 mg | ORAL_TABLET | Freq: Two times a day (BID) | ORAL | 0 refills | Status: DC

## 2023-05-09 MED ORDER — LIDOCAINE 5 % EX PTCH
1.0000 | MEDICATED_PATCH | CUTANEOUS | Status: DC
  Administered 2023-05-09: 1 via TRANSDERMAL
  Filled 2023-05-09: qty 1

## 2023-05-09 NOTE — ED Notes (Addendum)
Food/drink provided, pt would like to speak to provider again, message sent

## 2023-05-09 NOTE — Discharge Instructions (Signed)
It was a pleasure taking care of you today.  As discussed, your x-ray on 10/27 did not show any broken bones.  You were treated with pain medication here in the ER.  I am sending you home with pain medication.  Take as needed for pain.  You may also purchase over-the-counter Lidoderm patches and Voltaren gel for added pain relief.  I have included the number of [facility].  Please call to schedule an appointment to establish care.  Return to the ER for new or worsening symptoms.

## 2023-05-09 NOTE — ED Triage Notes (Signed)
Pt c/o buttocks pain since 2015. No injury/trauma.

## 2023-05-09 NOTE — ED Provider Notes (Signed)
[place] EMERGENCY DEPARTMENT AT [facility] Provider Note   CSN: [id number] Arrival date & time: 05/09/23  8657     History  No chief complaint on file.   Aaron Rubio is a 42 y.o. male with a past medical history significant for schizophrenia, hypertension, and polysubstance abuse who presents to the ED due to "tailbone pain" since 2015.  No recent injury.  Patient states it hurts because he was sitting at [place] bus stop overnight.  No fever or chills.  Normal bowel movements.  No recent rectal penetration.  Seen on 10/27 for the same complaint where he had an x-ray at that time which was negative.  History obtained from patient and past medical records. No interpreter used during encounter.       Home Medications Prior to Admission medications   Medication Sig Start Date End Date Taking? Authorizing Provider  naproxen (NAPROSYN) 500 MG tablet Take 1 tablet (500 mg total) by mouth 2 (two) times daily. 05/09/23  Yes Mikaya Bunner C, PA-C  azithromycin (ZITHROMAX) 250 MG tablet Take 1 tablet (250 mg total) by mouth daily. Patient not taking: Reported on 05/04/2023 06/16/22   Pricilla Loveless, MD  benzonatate (TESSALON) 100 MG capsule Take 1 capsule (100 mg total) by mouth every 8 (eight) hours. Patient not taking: Reported on 05/04/2023 05/03/23   Darrick Grinder, PA-C  benztropine (COGENTIN) 2 MG tablet Take 1 tablet (2 mg total) by mouth 2 (two) times daily. Patient not taking: Reported on 05/04/2023 07/15/21 05/04/23  Long, Arlyss Repress, MD  cyclobenzaprine (FLEXERIL) 10 MG tablet Take 1 tablet (10 mg total) by mouth 2 (two) times daily as needed for muscle spasms. Patient not taking: Reported on 05/04/2023 06/04/22   Tegeler, Canary Brim, MD  hydrOXYzine (ATARAX) 25 MG tablet Take 1-2 tablets (25-50 mg total) by mouth See admin instructions. Take 25 mg by mouth in the morning and 50 mg at bedtime Patient not taking: Reported on 05/04/2023 07/15/21   Long, Arlyss Repress, MD   metoprolol tartrate (LOPRESSOR) 25 MG tablet Take 1 tablet (25 mg total) by mouth in the morning. 05/07/23 06/06/23  Sabas Sous, MD  naproxen (NAPROSYN) 375 MG tablet Take 1 tablet (375 mg total) by mouth 2 (two) times daily. 05/07/23   Sabas Sous, MD  ondansetron (ZOFRAN-ODT) 4 MG disintegrating tablet Take 1 tablet (4 mg total) by mouth every 8 (eight) hours as needed for nausea or vomiting. Patient not taking: Reported on 05/04/2023 06/15/22   Pricilla Loveless, MD  TYLENOL 500 MG tablet Take 500-1,000 mg by mouth every 6 (six) hours as needed for mild pain (pain score 1-3) or headache.    [provider]      Allergies    Patient has no known allergies.    Review of Systems   Review of Systems  Constitutional:  Negative for fever.  Gastrointestinal:  Negative for abdominal pain.  Musculoskeletal:  Positive for back pain. Negative for gait problem.    Physical Exam Updated Vital Signs BP (!) 136/101 (BP Location: Right Arm)   Pulse 85   Temp 98.1 F (36.7 C) (Oral)   Resp 18   Ht 6\' 2"  (1.88 m)   Wt 84.4 kg   SpO2 100%   BMI 23.88 kg/m  Physical Exam Vitals and nursing note reviewed.  Constitutional:      General: He is not in acute distress.    Appearance: He is not ill-appearing.  HENT:  Head: Normocephalic.  Eyes:     Pupils: Pupils are equal, round, and reactive to light.  Cardiovascular:     Rate and Rhythm: Normal rate and regular rhythm.     Pulses: Normal pulses.     Heart sounds: Normal heart sounds. No murmur heard.    No friction rub. No gallop.  Pulmonary:     Effort: Pulmonary effort is normal.     Breath sounds: Normal breath sounds.  Abdominal:     General: Abdomen is flat. There is no distension.     Palpations: Abdomen is soft.     Tenderness: There is no abdominal tenderness. There is no guarding or rebound.  Genitourinary:    Comments: Rectal exam performed with chaperone in room.  No evidence of an abscess. Musculoskeletal:         General: Normal range of motion.     Cervical back: Neck supple.     Comments: No thoracic or lumbar midline tenderness.  Moving all 4 extremities without difficulty.  Skin:    General: Skin is warm and dry.  Neurological:     General: No focal deficit present.     Mental Status: He is alert.  Psychiatric:        Mood and Affect: Mood normal.        Behavior: Behavior normal.     ED Results / Procedures / Treatments   Labs (all labs ordered are listed, but only abnormal results are displayed) Labs Reviewed - No data to display  EKG None  Radiology No results found.  Procedures Procedures    Medications Ordered in ED Medications  lidocaine (LIDODERM) 5 % 1 patch (has no administration in time range)  ibuprofen (ADVIL) tablet 600 mg (has no administration in time range)    ED Course/ Medical Decision Making/ A&P                                 Medical Decision Making  42 year old male presents to the ED due to low back/tailbone pain since 2015. No recent injury. Seen on 10/27 for the same complaint where his x-ray was negative for any bony fractures.  Patient notes it hurts more today because he was sitting at the bus stop all night.  Denies saddle anesthesia, bowel/bladder incontinence, lower extremity numbness/tingling, lower extremity weakness.  No tenderness on exam.  Moving all 4 extremities without difficulty.  No abscess noted on exam.  Patient given ibuprofen and Lidoderm patch placed.  Food given to patient.  Low suspicion for cauda equina or central cord compression.  No infectious symptoms to suggest infectious etiology.  Patient discharged with pain medication and follow-up.  Patient stable for discharge. Strict ED precautions discussed with patient. Patient states understanding and agrees to plan. Patient discharged home in no acute distress and stable vitals         Final Clinical Impression(s) / ED Diagnoses Final diagnoses:  Acute bilateral low  back pain without sciatica    Rx / DC Orders ED Discharge Orders          Ordered    naproxen (NAPROSYN) 500 MG tablet  2 times daily        05/09/23 0725              Mannie Stabile, PA-C 05/09/23 0727    Glendora Score, MD 05/10/23 1204

## 2023-05-12 ENCOUNTER — Encounter (HOSPITAL_COMMUNITY): Payer: Self-pay

## 2023-05-12 ENCOUNTER — Emergency Department (HOSPITAL_COMMUNITY)
Admission: EM | Admit: 2023-05-12 | Discharge: 2023-05-13 | Disposition: A | Discharge status: Home / Self Care | Payer: Medicaid Other | Attending: Emergency Medicine | Admitting: Emergency Medicine

## 2023-05-12 DIAGNOSIS — Z1152 Encounter for screening for COVID-19: Secondary | ICD-10-CM | POA: Diagnosis not present

## 2023-05-12 DIAGNOSIS — R6883 Chills (without fever): Secondary | ICD-10-CM | POA: Insufficient documentation

## 2023-05-12 DIAGNOSIS — D649 Anemia, unspecified: Secondary | ICD-10-CM | POA: Diagnosis not present

## 2023-05-12 DIAGNOSIS — R0981 Nasal congestion: Secondary | ICD-10-CM | POA: Insufficient documentation

## 2023-05-12 DIAGNOSIS — I10 Essential (primary) hypertension: Secondary | ICD-10-CM | POA: Diagnosis not present

## 2023-05-12 DIAGNOSIS — R059 Cough, unspecified: Secondary | ICD-10-CM | POA: Diagnosis not present

## 2023-05-12 DIAGNOSIS — Z79899 Other long term (current) drug therapy: Secondary | ICD-10-CM | POA: Diagnosis not present

## 2023-05-12 DIAGNOSIS — M791 Myalgia, unspecified site: Secondary | ICD-10-CM | POA: Diagnosis present

## 2023-05-12 DIAGNOSIS — B349 Viral infection, unspecified: Secondary | ICD-10-CM | POA: Diagnosis not present

## 2023-05-12 DIAGNOSIS — Z5900 Homelessness unspecified: Secondary | ICD-10-CM | POA: Diagnosis not present

## 2023-05-12 DIAGNOSIS — R6889 Other general symptoms and signs: Secondary | ICD-10-CM

## 2023-05-12 DIAGNOSIS — J3489 Other specified disorders of nose and nasal sinuses: Secondary | ICD-10-CM | POA: Insufficient documentation

## 2023-05-12 DIAGNOSIS — Z765 Malingerer [conscious simulation]: Secondary | ICD-10-CM | POA: Insufficient documentation

## 2023-05-12 NOTE — ED Triage Notes (Signed)
Patient arrives stating he has delicate skin and has pain and general body aches. Patient has flight of ideas and rushed speech. Denies SI/HI.

## 2023-05-13 ENCOUNTER — Emergency Department (HOSPITAL_COMMUNITY)
Admission: EM | Admit: 2023-05-13 | Discharge: 2023-05-14 | Disposition: A | Discharge status: Home / Self Care | Payer: Medicaid Other | Source: Home / Self Care | Attending: Emergency Medicine | Admitting: Emergency Medicine

## 2023-05-13 ENCOUNTER — Emergency Department (HOSPITAL_COMMUNITY): Payer: Medicaid Other

## 2023-05-13 ENCOUNTER — Other Ambulatory Visit: Payer: Self-pay

## 2023-05-13 DIAGNOSIS — Z765 Malingerer [conscious simulation]: Secondary | ICD-10-CM | POA: Insufficient documentation

## 2023-05-13 DIAGNOSIS — Z79899 Other long term (current) drug therapy: Secondary | ICD-10-CM | POA: Insufficient documentation

## 2023-05-13 DIAGNOSIS — Z5900 Homelessness unspecified: Secondary | ICD-10-CM | POA: Insufficient documentation

## 2023-05-13 DIAGNOSIS — B349 Viral infection, unspecified: Secondary | ICD-10-CM | POA: Insufficient documentation

## 2023-05-13 DIAGNOSIS — D649 Anemia, unspecified: Secondary | ICD-10-CM | POA: Insufficient documentation

## 2023-05-13 DIAGNOSIS — I10 Essential (primary) hypertension: Secondary | ICD-10-CM | POA: Insufficient documentation

## 2023-05-13 LAB — CBC
HCT: 40.1 % (ref 39.0–52.0)
Hemoglobin: 12.5 g/dL — ABNORMAL LOW (ref 13.0–17.0)
MCH: 27.2 pg (ref 26.0–34.0)
MCHC: 31.2 g/dL (ref 30.0–36.0)
MCV: 87.4 fL (ref 80.0–100.0)
Platelets: 308 10*3/uL (ref 150–400)
RBC: 4.59 MIL/uL (ref 4.22–5.81)
RDW: 12.9 % (ref 11.5–15.5)
WBC: 6.8 10*3/uL (ref 4.0–10.5)
nRBC: 0 % (ref 0.0–0.2)

## 2023-05-13 LAB — BASIC METABOLIC PANEL
Anion gap: 10 (ref 5–15)
BUN: 17 mg/dL (ref 6–20)
CO2: 24 mmol/L (ref 22–32)
Calcium: 9.2 mg/dL (ref 8.9–10.3)
Chloride: 105 mmol/L (ref 98–111)
Creatinine, Ser: 1.02 mg/dL (ref 0.61–1.24)
GFR, Estimated: >60 mL/min (ref >60)
Glucose, Bld: 108 mg/dL — ABNORMAL HIGH (ref 70–99)
Potassium: 3.8 mmol/L (ref 3.5–5.1)
Sodium: 139 mmol/L (ref 135–145)

## 2023-05-13 LAB — RESP PANEL BY RT-PCR (RSV, FLU A&B, COVID)  RVPGX2
Influenza A by PCR: NEGATIVE
Influenza B by PCR: NEGATIVE
Resp Syncytial Virus by PCR: NEGATIVE
SARS Coronavirus 2 by RT PCR: NEGATIVE

## 2023-05-13 MED ORDER — IBUPROFEN 200 MG PO TABS
600.0000 mg | ORAL_TABLET | Freq: Once | ORAL | Status: AC
  Administered 2023-05-13: 600 mg via ORAL
  Filled 2023-05-13: qty 3

## 2023-05-13 NOTE — ED Provider Notes (Signed)
[place] EMERGENCY DEPARTMENT AT [facility] Provider Note   CSN: [id number] Arrival date & time: 05/12/23  2200     History  Chief Complaint  Patient presents with   Generalized Body Aches    Aaron Rubio is a 42 y.o. male.  HPI Patient presents for flulike symptoms.  Medical history includes HTN, schizophrenia, polysubstance abuse.  He has had frequent ED visits.  Most recently, he was seen 4 days ago for tailbone pain.  Today, patient endorses runny nose, congestion, cough, chills.  He has pain in his heels which he attributes to walking throughout the days.  He denies any other new symptoms.  He does ask for food.    Home Medications Prior to Admission medications   Medication Sig Start Date End Date Taking? Authorizing Provider  azithromycin (ZITHROMAX) 250 MG tablet Take 1 tablet (250 mg total) by mouth daily. Patient not taking: Reported on 05/04/2023 06/16/22   Pricilla Loveless, MD  benzonatate (TESSALON) 100 MG capsule Take 1 capsule (100 mg total) by mouth every 8 (eight) hours. Patient not taking: Reported on 05/04/2023 05/03/23   Darrick Grinder, PA-C  benztropine (COGENTIN) 2 MG tablet Take 1 tablet (2 mg total) by mouth 2 (two) times daily. Patient not taking: Reported on 05/04/2023 07/15/21 05/04/23  Long, Arlyss Repress, MD  cyclobenzaprine (FLEXERIL) 10 MG tablet Take 1 tablet (10 mg total) by mouth 2 (two) times daily as needed for muscle spasms. Patient not taking: Reported on 05/04/2023 06/04/22   Tegeler, Canary Brim, MD  hydrOXYzine (ATARAX) 25 MG tablet Take 1-2 tablets (25-50 mg total) by mouth See admin instructions. Take 25 mg by mouth in the morning and 50 mg at bedtime Patient not taking: Reported on 05/04/2023 07/15/21   Long, Arlyss Repress, MD  metoprolol tartrate (LOPRESSOR) 25 MG tablet Take 1 tablet (25 mg total) by mouth in the morning. 05/07/23 06/06/23  Sabas Sous, MD  naproxen (NAPROSYN) 375 MG tablet Take 1 tablet (375 mg total) by mouth  2 (two) times daily. 05/07/23   Sabas Sous, MD  naproxen (NAPROSYN) 500 MG tablet Take 1 tablet (500 mg total) by mouth 2 (two) times daily. 05/09/23   Mannie Stabile, PA-C  ondansetron (ZOFRAN-ODT) 4 MG disintegrating tablet Take 1 tablet (4 mg total) by mouth every 8 (eight) hours as needed for nausea or vomiting. Patient not taking: Reported on 05/04/2023 06/15/22   Pricilla Loveless, MD  TYLENOL 500 MG tablet Take 500-1,000 mg by mouth every 6 (six) hours as needed for mild pain (pain score 1-3) or headache.    [provider]      Allergies    Patient has no known allergies.    Review of Systems   Review of Systems  Constitutional:  Positive for chills.  HENT:  Positive for congestion and rhinorrhea.   Respiratory:  Positive for cough.   Musculoskeletal:  Positive for myalgias.  All other systems reviewed and are negative.   Physical Exam Updated Vital Signs BP (!) 140/96 (BP Location: Left Arm)   Pulse 99   Temp 97.7 F (36.5 C) (Oral)   Resp 18   SpO2 100%  Physical Exam Vitals and nursing note reviewed.  Constitutional:      General: He is not in acute distress.    Appearance: Normal appearance. He is well-developed. He is not ill-appearing, toxic-appearing or diaphoretic.  HENT:     Head: Normocephalic and atraumatic.     Right Ear:  External ear normal.     Left Ear: External ear normal.     Nose: Nose normal.     Mouth/Throat:     Mouth: Mucous membranes are moist.  Eyes:     Extraocular Movements: Extraocular movements intact.     Conjunctiva/sclera: Conjunctivae normal.  Cardiovascular:     Rate and Rhythm: Normal rate and regular rhythm.  Pulmonary:     Effort: Pulmonary effort is normal. No respiratory distress.     Breath sounds: No wheezing or rales.  Abdominal:     General: There is no distension.     Palpations: Abdomen is soft.     Tenderness: There is no abdominal tenderness.  Musculoskeletal:        General: No swelling or  deformity. Normal range of motion.     Cervical back: Normal range of motion and neck supple.     Comments: Mild swelling to plantar aspects of both feet.  Skin is intact.  Skin:    General: Skin is warm and dry.     Coloration: Skin is not jaundiced or pale.  Neurological:     General: No focal deficit present.     Mental Status: He is alert and oriented to person, place, and time.  Psychiatric:        Mood and Affect: Mood normal.        Behavior: Behavior normal.     ED Results / Procedures / Treatments   Labs (all labs ordered are listed, but only abnormal results are displayed) Labs Reviewed  RESP PANEL BY RT-PCR (RSV, FLU A&B, COVID)  [id number]    EKG None  Radiology No results found.  Procedures Procedures    Medications Ordered in ED Medications  ibuprofen (ADVIL) tablet 600 mg (600 mg Oral Given 05/13/23 0610)    ED Course/ Medical Decision Making/ A&P                                 Medical Decision Making Amount and/or Complexity of Data Reviewed Radiology: ordered.  Risk OTC drugs.   Patient presents to the ED endorsing recent flulike symptoms.  Symptoms include cough, congestion, rhinorrhea, chills, and myalgias.  Vital signs on arrival are notable for hypertension.  Patient is well-appearing on exam.  Breathing is unlabored.  He endorses pain in the bilateral heels.  He does have some swelling to the plantar aspects of feet but skin is intact and not concerning for infection.  Viral swab and chest x-ray were ordered.  Ibuprofen was ordered for symptomatic relief.  Patient requested food, which was provided.  COVID/flu testing were negative.  Chest x-ray unremarkable.  Patient was discharged in stable condition.        Final Clinical Impression(s) / ED Diagnoses Final diagnoses:  Flu-like symptoms    Rx / DC Orders ED Discharge Orders     None         Gloris Manchester, MD 05/13/23 [phone]

## 2023-05-13 NOTE — Discharge Instructions (Signed)
Your COVID and flu testing were negative.  X-ray does not show pneumonia.  Take ibuprofen and Tylenol as needed for chills and aches.  Return to the emergency department for any new or worsening symptoms of concern.

## 2023-05-13 NOTE — ED Triage Notes (Addendum)
Pt arrives to ED c/o cough and congestion x 4 days. Pt reports that sputum is green in color. Pt also endorses body aches. Pt afebrile and denies CP and SOB

## 2023-05-14 MED ORDER — ACETAMINOPHEN 325 MG PO TABS
650.0000 mg | ORAL_TABLET | Freq: Once | ORAL | Status: AC
  Administered 2023-05-14: 650 mg via ORAL
  Filled 2023-05-14: qty 2

## 2023-05-14 NOTE — ED Provider Notes (Signed)
[place] EMERGENCY DEPARTMENT AT [facility] Provider Note   CSN: [id number] Arrival date & time: 05/13/23  1913     History  Chief Complaint  Patient presents with   Cough    Aaron Rubio is a 42 y.o. male with hx of polysubstance use and homelessness who presents as his 6th visit this month with concern for viral symptoms x 4 days, for which he has been seen multiple times in the past few days. States this evening that he was cold outside, feeling poorly, and the blister on the bottom of his left foot was hurting prompting ED visit. States he just needs some rest because he can't sit still outside because he is homeless and it is cold.     HPI     Home Medications Prior to Admission medications   Medication Sig Start Date End Date Taking? Authorizing Provider  azithromycin (ZITHROMAX) 250 MG tablet Take 1 tablet (250 mg total) by mouth daily. Patient not taking: Reported on 05/04/2023 06/16/22   Pricilla Loveless, MD  benzonatate (TESSALON) 100 MG capsule Take 1 capsule (100 mg total) by mouth every 8 (eight) hours. Patient not taking: Reported on 05/04/2023 05/03/23   Darrick Grinder, PA-C  benztropine (COGENTIN) 2 MG tablet Take 1 tablet (2 mg total) by mouth 2 (two) times daily. Patient not taking: Reported on 05/04/2023 07/15/21 05/04/23  Long, Arlyss Repress, MD  cyclobenzaprine (FLEXERIL) 10 MG tablet Take 1 tablet (10 mg total) by mouth 2 (two) times daily as needed for muscle spasms. Patient not taking: Reported on 05/04/2023 06/04/22   Tegeler, Canary Brim, MD  hydrOXYzine (ATARAX) 25 MG tablet Take 1-2 tablets (25-50 mg total) by mouth See admin instructions. Take 25 mg by mouth in the morning and 50 mg at bedtime Patient not taking: Reported on 05/04/2023 07/15/21   Long, Arlyss Repress, MD  metoprolol tartrate (LOPRESSOR) 25 MG tablet Take 1 tablet (25 mg total) by mouth in the morning. 05/07/23 06/06/23  Sabas Sous, MD  naproxen (NAPROSYN) 375 MG tablet Take 1  tablet (375 mg total) by mouth 2 (two) times daily. 05/07/23   Sabas Sous, MD  naproxen (NAPROSYN) 500 MG tablet Take 1 tablet (500 mg total) by mouth 2 (two) times daily. 05/09/23   Mannie Stabile, PA-C  ondansetron (ZOFRAN-ODT) 4 MG disintegrating tablet Take 1 tablet (4 mg total) by mouth every 8 (eight) hours as needed for nausea or vomiting. Patient not taking: Reported on 05/04/2023 06/15/22   Pricilla Loveless, MD  TYLENOL 500 MG tablet Take 500-1,000 mg by mouth every 6 (six) hours as needed for mild pain (pain score 1-3) or headache.    [provider]      Allergies    Patient has no known allergies.    Review of Systems   Review of Systems  Constitutional:  Positive for chills and fatigue.  HENT:  Positive for congestion.   Respiratory:  Positive for cough. Negative for shortness of breath.   Cardiovascular: Negative.   Musculoskeletal:  Positive for myalgias.    Physical Exam Updated Vital Signs BP (!) 133/98 (BP Location: Left Arm)   Pulse 73   Temp 98.4 F (36.9 C) (Oral)   Resp 17   Ht 6\' 2"  (1.88 m)   Wt 98.4 kg   SpO2 100%   BMI 27.86 kg/m  Physical Exam Vitals and nursing note reviewed.  Constitutional:      Appearance: He is not ill-appearing or  toxic-appearing.  HENT:     Head: Normocephalic and atraumatic.     Mouth/Throat:     Mouth: Mucous membranes are moist.     Pharynx: No oropharyngeal exudate or posterior oropharyngeal erythema.  Eyes:     General:        Right eye: No discharge.        Left eye: No discharge.     Conjunctiva/sclera: Conjunctivae normal.  Cardiovascular:     Rate and Rhythm: Normal rate and regular rhythm.     Pulses: Normal pulses.     Heart sounds: Normal heart sounds. No murmur heard. Pulmonary:     Effort: Pulmonary effort is normal. No respiratory distress.     Breath sounds: Normal breath sounds. No wheezing or rales.  Abdominal:     General: Bowel sounds are normal. There is no distension.      Palpations: Abdomen is soft.     Tenderness: There is no abdominal tenderness. There is no guarding or rebound.  Musculoskeletal:        General: No deformity.     Cervical back: Neck supple.     Right lower leg: No edema.     Left lower leg: No edema.       Feet:  Skin:    General: Skin is warm and dry.     Capillary Refill: Capillary refill takes less than 2 seconds.  Neurological:     General: No focal deficit present.     Mental Status: He is alert and oriented to person, place, and time. Mental status is at baseline.  Psychiatric:        Mood and Affect: Mood normal.     ED Results / Procedures / Treatments   Labs (all labs ordered are listed, but only abnormal results are displayed) Labs Reviewed  CBC - Abnormal; Notable for the following components:      Result Value   Hemoglobin 12.5 (*)    All other components within normal limits  BASIC METABOLIC PANEL - Abnormal; Notable for the following components:   Glucose, Bld 108 (*)    All other components within normal limits    EKG None  Radiology DG Chest 1 View  Result Date: 05/13/2023 CLINICAL DATA:  Cough and congestion x4 days. EXAM: CHEST  1 VIEW COMPARISON:  May 13, 2023 FINDINGS: The heart size and mediastinal contours are within normal limits. Both lungs are clear. The visualized skeletal structures are unremarkable. IMPRESSION: No acute cardiopulmonary disease. Electronically Signed   By: Aram Candela M.D.   On: 05/13/2023 20:51   DG Chest Portable 1 View  Result Date: 05/13/2023 CLINICAL DATA:  Cough.  05/06/2023 EXAM: PORTABLE CHEST 1 VIEW COMPARISON:  05/06/2023 FINDINGS: The heart size and mediastinal contours are within normal limits. Both lungs are clear. The visualized skeletal structures are unremarkable. IMPRESSION: No active disease. Electronically Signed   By: Kennith Center M.D.   On: 05/13/2023 07:43    Procedures Procedures    Medications Ordered in ED Medications  acetaminophen  (TYLENOL) tablet 650 mg (650 mg Oral Given 05/14/23 0058)    ED Course/ Medical Decision Making/ A&P                                 Medical Decision Making 42 y/o male with homelessness who presents with concern for viral syndrome and proceed with a place to sleep.  HTN on intake, VS  Signs otherwise normal.  CouldHypertensive on intake, vital, and abdominal exams are benign.  Patient is well-appearing.  Blistering of on the left foot without evidence of infection.    Amount and/or Complexity of Data Reviewed Labs: ordered.    Details:   CBC with mild anemia with hemoglobin of 12.5 near patient's baseline.  BMP unremarkable.  Chest x-ray negative for acute cardiopulmonary disease, visualized by this provider.  Patient did have RVP yesterday which was pan negative. Radiology: ordered.  Risk OTC drugs.   Clinical picture consistent with acute viral syndrome without evidence of emergent underlying etiology that would warrant further ED workup or inpatient management.  Also feel there is a heavy component of malingering with pursuit of secondary gain of place to sleep.  Patient offered something to eat, however given his reassuring workup and physical exam, no further reason to keep him in the emergency department at this time.  Aaron Rubio  voiced understanding of his medical evaluation and treatment plan. Each of their questions answered to their expressed satisfaction.  Return precautions were given.  Patient is well-appearing, stable, and was discharged in good condition.  This chart was dictated using voice recognition software, Dragon. Despite the best efforts of this provider to proofread and correct errors, errors may still occur which can change documentation meaning.         Final Clinical Impression(s) / ED Diagnoses Final diagnoses:  Viral syndrome  Homelessness  Malingering    Rx / DC Orders ED Discharge Orders     None         Sherrilee Gilles 05/14/23 0321    Mesner, Barbara Cower, MD 05/14/23 2257

## 2023-05-14 NOTE — ED Notes (Signed)
AVS provided by edp was discussed with pt. Pt verbalized understanding with no additional questions at this time.

## 2023-05-14 NOTE — ED Notes (Signed)
Pt provided with curlex per pa request

## 2023-05-14 NOTE — Discharge Instructions (Signed)
Please see the attached housing resources. Return to the ER with any new severe symptoms.

## 2023-05-15 ENCOUNTER — Other Ambulatory Visit: Payer: Self-pay

## 2023-05-15 ENCOUNTER — Emergency Department (HOSPITAL_COMMUNITY)
Admission: EM | Admit: 2023-05-15 | Discharge: 2023-05-16 | Disposition: A | Discharge status: Home / Self Care | Payer: Medicaid Other | Attending: Emergency Medicine | Admitting: Emergency Medicine

## 2023-05-15 ENCOUNTER — Encounter (HOSPITAL_COMMUNITY): Payer: Self-pay

## 2023-05-15 DIAGNOSIS — F29 Unspecified psychosis not due to a substance or known physiological condition: Secondary | ICD-10-CM | POA: Diagnosis not present

## 2023-05-15 DIAGNOSIS — I10 Essential (primary) hypertension: Secondary | ICD-10-CM | POA: Insufficient documentation

## 2023-05-15 DIAGNOSIS — R45851 Suicidal ideations: Secondary | ICD-10-CM | POA: Insufficient documentation

## 2023-05-15 DIAGNOSIS — Z79899 Other long term (current) drug therapy: Secondary | ICD-10-CM | POA: Insufficient documentation

## 2023-05-15 DIAGNOSIS — F22 Delusional disorders: Secondary | ICD-10-CM | POA: Diagnosis present

## 2023-05-15 DIAGNOSIS — F17210 Nicotine dependence, cigarettes, uncomplicated: Secondary | ICD-10-CM | POA: Insufficient documentation

## 2023-05-15 LAB — CBC WITH DIFFERENTIAL/PLATELET
Abs Immature Granulocytes: 0.03 10*3/uL (ref 0.00–0.07)
Basophils Absolute: 0 10*3/uL (ref 0.0–0.1)
Basophils Relative: 1 %
Eosinophils Absolute: 0.1 10*3/uL (ref 0.0–0.5)
Eosinophils Relative: 2 %
HCT: 39.2 % (ref 39.0–52.0)
Hemoglobin: 12.3 g/dL — ABNORMAL LOW (ref 13.0–17.0)
Immature Granulocytes: 0 %
Lymphocytes Relative: 20 %
Lymphs Abs: 1.5 10*3/uL (ref 0.7–4.0)
MCH: 27.9 pg (ref 26.0–34.0)
MCHC: 31.4 g/dL (ref 30.0–36.0)
MCV: 88.9 fL (ref 80.0–100.0)
Monocytes Absolute: 0.7 10*3/uL (ref 0.1–1.0)
Monocytes Relative: 9 %
Neutro Abs: 5.1 10*3/uL (ref 1.7–7.7)
Neutrophils Relative %: 68 %
Platelets: 273 10*3/uL (ref 150–400)
RBC: 4.41 MIL/uL (ref 4.22–5.81)
RDW: 12.8 % (ref 11.5–15.5)
WBC: 7.5 10*3/uL (ref 4.0–10.5)
nRBC: 0 % (ref 0.0–0.2)

## 2023-05-15 LAB — BASIC METABOLIC PANEL
Anion gap: 8 (ref 5–15)
BUN: 8 mg/dL (ref 6–20)
CO2: 27 mmol/L (ref 22–32)
Calcium: 9.1 mg/dL (ref 8.9–10.3)
Chloride: 102 mmol/L (ref 98–111)
Creatinine, Ser: 0.95 mg/dL (ref 0.61–1.24)
GFR, Estimated: >60 mL/min (ref >60)
Glucose, Bld: 89 mg/dL (ref 70–99)
Potassium: 3.5 mmol/L (ref 3.5–5.1)
Sodium: 137 mmol/L (ref 135–145)

## 2023-05-15 LAB — ACETAMINOPHEN LEVEL: Acetaminophen (Tylenol), Serum: <10 ug/mL — ABNORMAL LOW (ref 10–30)

## 2023-05-15 LAB — SALICYLATE LEVEL: Salicylate Lvl: <7 mg/dL — ABNORMAL LOW (ref 7.0–30.0)

## 2023-05-15 LAB — ETHANOL: Alcohol, Ethyl (B): <10 mg/dL (ref <10)

## 2023-05-15 NOTE — ED Notes (Signed)
Provider at bedside

## 2023-05-15 NOTE — ED Provider Notes (Signed)
[facility] AT [facility] Provider Note   CSN: [id number] Arrival date & time: 05/15/23  1846     History  Chief Complaint  Patient presents with   Psychiatric Evaluation    Aaron Rubio is a 42 y.o. male.  42 year old male with prior medical history as detailed below presents for evaluation.  Patient is requesting psychiatric evaluation.  Patient reports increased paranoia.  He reports increased thoughts of self-harm.  He denies any specific plan to hurt himself.  He is here voluntarily.  The history is provided by the patient and medical records.       Home Medications Prior to Admission medications   Medication Sig Start Date End Date Taking? Authorizing Provider  azithromycin (ZITHROMAX) 250 MG tablet Take 1 tablet (250 mg total) by mouth daily. Patient not taking: Reported on 05/04/2023 06/16/22   Pricilla Loveless, MD  benzonatate (TESSALON) 100 MG capsule Take 1 capsule (100 mg total) by mouth every 8 (eight) hours. Patient not taking: Reported on 05/04/2023 05/03/23   Darrick Grinder, PA-C  benztropine (COGENTIN) 2 MG tablet Take 1 tablet (2 mg total) by mouth 2 (two) times daily. Patient not taking: Reported on 05/04/2023 07/15/21 05/04/23  Long, Arlyss Repress, MD  cyclobenzaprine (FLEXERIL) 10 MG tablet Take 1 tablet (10 mg total) by mouth 2 (two) times daily as needed for muscle spasms. Patient not taking: Reported on 05/04/2023 06/04/22   Tegeler, Canary Brim, MD  hydrOXYzine (ATARAX) 25 MG tablet Take 1-2 tablets (25-50 mg total) by mouth See admin instructions. Take 25 mg by mouth in the morning and 50 mg at bedtime Patient not taking: Reported on 05/04/2023 07/15/21   Long, Arlyss Repress, MD  metoprolol tartrate (LOPRESSOR) 25 MG tablet Take 1 tablet (25 mg total) by mouth in the morning. 05/07/23 06/06/23  Sabas Sous, MD  naproxen (NAPROSYN) 375 MG tablet Take 1 tablet (375 mg total) by mouth 2 (two) times daily. 05/07/23   Sabas Sous, MD  naproxen (NAPROSYN) 500 MG tablet Take 1 tablet (500 mg total) by mouth 2 (two) times daily. 05/09/23   Mannie Stabile, PA-C  ondansetron (ZOFRAN-ODT) 4 MG disintegrating tablet Take 1 tablet (4 mg total) by mouth every 8 (eight) hours as needed for nausea or vomiting. Patient not taking: Reported on 05/04/2023 06/15/22   Pricilla Loveless, MD  TYLENOL 500 MG tablet Take 500-1,000 mg by mouth every 6 (six) hours as needed for mild pain (pain score 1-3) or headache.    [provider]      Allergies    Patient has no known allergies.    Review of Systems   Review of Systems  All other systems reviewed and are negative.   Physical Exam Updated Vital Signs BP (!) 153/101 (BP Location: Left Arm)   Pulse 89   Temp 99.1 F (37.3 C) (Oral)   Resp 20   Wt 98 kg   SpO2 100%   BMI 27.74 kg/m  Physical Exam Vitals and nursing note reviewed.  Constitutional:      General: He is not in acute distress.    Appearance: Normal appearance. He is well-developed.  HENT:     Head: Normocephalic and atraumatic.  Eyes:     Conjunctiva/sclera: Conjunctivae normal.     Pupils: Pupils are equal, round, and reactive to light.  Cardiovascular:     Rate and Rhythm: Normal rate and regular rhythm.     Heart sounds: Normal heart  sounds.  Pulmonary:     Effort: Pulmonary effort is normal. No respiratory distress.     Breath sounds: Normal breath sounds.  Abdominal:     General: There is no distension.     Palpations: Abdomen is soft.     Tenderness: There is no abdominal tenderness.  Musculoskeletal:        General: No deformity. Normal range of motion.     Cervical back: Normal range of motion and neck supple.  Skin:    General: Skin is warm and dry.  Neurological:     General: No focal deficit present.     Mental Status: He is alert and oriented to person, place, and time.     ED Results / Procedures / Treatments   Labs (all labs ordered are listed, but only  abnormal results are displayed) Labs Reviewed  CBC WITH DIFFERENTIAL/PLATELET - Abnormal; Notable for the following components:      Result Value   Hemoglobin 12.3 (*)    All other components within normal limits  ACETAMINOPHEN LEVEL - Abnormal; Notable for the following components:   Acetaminophen (Tylenol), Serum <10 (*)    All other components within normal limits  SALICYLATE LEVEL - Abnormal; Notable for the following components:   Salicylate Lvl <7.0 (*)    All other components within normal limits  ETHANOL  BASIC METABOLIC PANEL  RAPID URINE DRUG SCREEN, HOSP PERFORMED    EKG None  Radiology No results found.  Procedures Procedures    Medications Ordered in ED Medications - No data to display  ED Course/ Medical Decision Making/ A&P                                 Medical Decision Making Amount and/or Complexity of Data Reviewed Labs: ordered.    Medical Screen Complete  This patient presented to the ED with complaint of paranoia, suicidal ideation.  This complaint involves an extensive number of treatment options. The initial differential diagnosis includes, but is not limited to, mental health emergency  This presentation is: Acute, Chronic, Self-Limited, Previously Undiagnosed, Uncertain Prognosis, Complicated, Systemic Symptoms, and Threat to Life/Bodily Function  Patient is presenting with complaint of increased paranoia and increased thoughts of self-harm.  Patient is medically clear for pending psychiatric evaluation.  Final disposition dependent upon TTS/psychiatric plan of care.  Additional history obtained:  External records from outside sources obtained and reviewed including prior ED visits and prior Inpatient records.    Problem List / ED Course:  Paranoia, suicidal ideation   Reevaluation:  After the interventions noted above, I reevaluated the patient and found that they have: stayed the same  Disposition:  After consideration  of the diagnostic results and the patients response to treatment, I feel that the patent would benefit from psychiatric evaluation.          Final Clinical Impression(s) / ED Diagnoses Final diagnoses:  Suicidal ideation    Rx / DC Orders ED Discharge Orders     None         Wynetta Fines, MD 05/15/23 2052

## 2023-05-15 NOTE — ED Triage Notes (Signed)
C/o auditory hallucinations that make him feel worthless.  Pt states "the voices make me feel suicidal but I dont think I can do that."  Denies HI Hx schizophrenia.

## 2023-05-15 NOTE — ED Notes (Addendum)
Pt being escorted top TCU by Bobby,RN.

## 2023-05-16 DIAGNOSIS — F29 Unspecified psychosis not due to a substance or known physiological condition: Secondary | ICD-10-CM

## 2023-05-16 NOTE — Consult Note (Addendum)
[facility] Consult Note  Patient Name: Aaron Rubio MRN: [id number] DOB: 1980/07/29 DATE OF Consult: 05/16/2023  PRIMARY PSYCHIATRIC DIAGNOSES  1.  Unspecified psychosis 2.  R/o substance induced psychosis  3.  R/o secondary gain  RECOMMENDATIONS  Recommendations: Medication recommendations: None at this time Non-Medication/therapeutic recommendations: -Resources for shelter; Resources for outpatient therapy and psychiatry; Resources for outpatient substance use treatment; crisis line information; ED return precautions  Is inpatient psychiatric hospitalization recommended for this patient? No (Explain why): Passive suicidal ideation, able to articulate a plan for self care outside of the hospital, does not meet criteria for inpatient psychiatric hospitalization  Follow-Up Telepsychiatry C/L services: We will sign off for now. Please re-consult our service if needed for any concerning changes in the patient's condition, discharge planning, or questions. Communication: Treatment team members (and family members if applicable) who were involved in treatment/care discussions and planning, and with whom we spoke or engaged with via secure text/chat, include the following: Dr. Eudelia Bunch; Reita Cliche   Thank you for involving [place] in the care of this patient. If you have any additional questions or concerns, please call [phone] and ask for me or the provider on-call.  TELEPSYCHIATRY ATTESTATION & CONSENT  As the provider for this telehealth consult, I attest that I verified the patient's identity using two separate identifiers, introduced myself to the patient, provided my credentials, disclosed my location, and performed this encounter via a HIPAA-compliant, real-time, face-to-face, two-way, interactive audio and video platform and with the full consent and agreement of the patient (or guardian as applicable.)  Patient physical location: ED in [facility]  Telehealth provider  physical location: home office in state of [place]   Video start time: 0010 AM EST Video end time: 0031 AM EST   IDENTIFYING DATA  Aaron Rubio is a 42 y.o. year-old male for whom a psychiatric consultation has been ordered by the primary provider. The patient was identified using two separate identifiers.  CHIEF COMPLAINT/REASON FOR CONSULT  Auditory hallucinations and passive suicidal ideation    HISTORY OF PRESENT ILLNESS (HPI)  Aaron Rubio is a 42 year old male, houseless, with a history of schizophrenia, substance induced psychosis, alcohol, opioid, and stimulant use disorders, and hypertension who presents to the ED endorsing auditory hallucinations and passive suicidal ideation. Patient with frequent ED presentations at multiple emergency rooms for pain and shelter seeking with escalation of behavior when his perceived needs are not met. Seen by this writer 11/1 and when told he was being discharged escalated his behavior and said he was suicidal with a plan. He then re-presented to another [facility] ED and was evaluated by a different psychiatrist and discharged. No UDS resulted, ethanol negative. Psychiatry consulted for disposition recommendations.   On evaluation, patient noted to be resting comfortably with his eyes closed, in no distress.Patient calm, cooperative, normoverbal, euthymic, linear with some underlying thought disorganization, not appearing internally preoccupied, not responding to internal stimuli, alert and oriented x 4. Patient reports he has been on the streets for a couple of months. He states, "I realized I am hearing voices and I'm playing a dangerous game with my life." Patient states, "I need someone to rely on to vent about my life." When inquire about how patient is in danger, he states he walked up to an argument earlier. Endorses vague thoughts that he might be in danger from an unknown set of people. Patient endorses passive suicidal ideation, denies  suicidal intent and plan. When inquire about auditory  hallucinations, patient describes auditory hallucinations and struggles to describe what the voices say to him. He states, "Life is not simple and you shouldn't give up." Denies command auditory hallucinations. Patient denies depressed mood, hopelessness, other depressive symptoms. Denies symptoms consistent with mania/hypomania, paranoia, homicidal ideation. Patient reports he used cocaine today, uses as often as he can. Denies the use of alcohol or percocet in the last several day. Patient reports he was last taking medication for his mental health > 1 year ago when [place]. Patient states he doesn't know if medication was helpful. Patient reports recently one shelter was closed and the other shelter said that you have to have an ID to stay there. Patient states he was thinking of going to the jail to get a printout of himself so he had a photo ID. Patient states his body hurts "because I've been running it down on the streets."    PAST PSYCHIATRIC HISTORY  Current psych meds: Denies  Prior psych meds: Haldol, cogentin, Buspar Outpatient: Denies currently  Inpatient: Once  Non-suicidal self injury: Denies  Suicide attempts: Denies Violence: Yes Drugs/alcohol: cocaine, last use today, states he uses daily, does not know how much he uses. Uses meth, does not know when he last used. Uses Percocet that he buys on the street, does not know the amount he uses, reports he uses every few days. Reports he drinks 2-3 beers daily, denies alcohol use today  Otherwise as per HPI above.  PAST MEDICAL HISTORY  Past Medical History:  Diagnosis Date   Hypertension    Schizophrenia (HCC)    Stimulant use disorder Alcohol use disorder Opioid use disorder  HOME MEDICATIONS  PTA Medications  Medication Sig   hydrOXYzine (ATARAX) 25 MG tablet Take 1-2 tablets (25-50 mg total) by mouth See admin instructions. Take 25 mg by mouth  in the morning and 50 mg at bedtime (Patient not taking: Reported on 05/04/2023)   cyclobenzaprine (FLEXERIL) 10 MG tablet Take 1 tablet (10 mg total) by mouth 2 (two) times daily as needed for muscle spasms. (Patient not taking: Reported on 05/04/2023)   azithromycin (ZITHROMAX) 250 MG tablet Take 1 tablet (250 mg total) by mouth daily. (Patient not taking: Reported on 05/04/2023)   ondansetron (ZOFRAN-ODT) 4 MG disintegrating tablet Take 1 tablet (4 mg total) by mouth every 8 (eight) hours as needed for nausea or vomiting. (Patient not taking: Reported on 05/04/2023)   benzonatate (TESSALON) 100 MG capsule Take 1 capsule (100 mg total) by mouth every 8 (eight) hours. (Patient not taking: Reported on 05/04/2023)   TYLENOL 500 MG tablet Take 500-1,000 mg by mouth every 6 (six) hours as needed for mild pain (pain score 1-3) or headache.   naproxen (NAPROSYN) 375 MG tablet Take 1 tablet (375 mg total) by mouth 2 (two) times daily.   metoprolol tartrate (LOPRESSOR) 25 MG tablet Take 1 tablet (25 mg total) by mouth in the morning.   naproxen (NAPROSYN) 500 MG tablet Take 1 tablet (500 mg total) by mouth 2 (two) times daily.     ALLERGIES  No Known Allergies  SOCIAL & SUBSTANCE USE HISTORY  Social History   Socioeconomic History   Marital status: Single    Spouse name: Not on file   Number of children: Not on file   Years of education: Not on file   Highest education level: Not on file  Occupational History   Not on file  Tobacco Use  Smoking status: Every Day    Current packs/day: 0.30    Types: Cigarettes   Smokeless tobacco: Not on file  Substance and Sexual Activity   Alcohol use: Not Currently   Drug use: Not Currently   Sexual activity: Not on file  Other Topics Concern   Not on file  Social History Narrative   Not on file   Social Determinants of Health   Financial Resource Strain: Not on file  Food Insecurity: Not on file  Transportation Needs: Not on file  Physical  Activity: Not on file  Stress: Not on file  Social Connections: Unknown (05/08/2022)   [facility]   Social Network    Social Network: Not on file   Social History   Tobacco Use  Smoking Status Every Day   Current packs/day: 0.30   Types: Cigarettes  Smokeless Tobacco Not on file   Social History   Substance and Sexual Activity  Alcohol Use Not Currently   Social History   Substance and Sexual Activity  Drug Use Not Currently    Additional pertinent information cocaine, last use today, states he uses daily, does not know how much he uses. Uses meth, does not know when he last used. Uses Percocet that he buys on the street, does not know the amount he uses, reports he uses every few days. Reports he drinks 2-3 beers daily, denies alcohol use today    FAMILY HISTORY   Family Psychiatric History (if known):  Denies   MENTAL STATUS EXAM (MSE)  Presentation  General Appearance:  Appropriate for Environment  Eye Contact: Poor  Speech: Normal Rate  Speech Volume: Normal   Mood and Affect  Mood: Euthymic  Affect: Appropriate   Thought Process  Thought Processes: Coherent  Descriptions of Associations: Intact  Orientation: Full (Time, Place and Person)  Thought Content: Computation  History of Schizophrenia/Schizoaffective disorder: Yes Duration of Psychotic Symptoms: Unknown Hallucinations: Hallucinations: Auditory  Ideas of Reference: Denies   Suicidal Thoughts: Suicidal Thoughts: Yes, Passive  Homicidal Thoughts: Homicidal Thoughts: No   Sensorium  Memory: Immediate Good; Recent Good; Remote Good  Judgment: Fair  Insight: Shallow   Executive Functions  Concentration: Fair  Attention Span: Fair  Recall: Fair  Fund of Knowledge: Fair  Language: Good   Psychomotor Activity  Psychomotor Activity: Psychomotor Activity: Normal   Assets  Assets: Communication Skills    VITALS  Blood pressure  (!) 153/101, pulse 89, temperature 99.1 F (37.3 C), temperature source Oral, resp. rate 20, weight 98 kg, SpO2 100%.  LABS  Admission on 05/15/2023  Component Date Value Ref Range Status   WBC 05/15/2023 7.5  4.0 - 10.5 K/uL Final   RBC 05/15/2023 4.41  4.22 - 5.81 MIL/uL Final   Hemoglobin 05/15/2023 12.3 (L)  13.0 - 17.0 g/dL Final   HCT 75/64/3329 39.2  39.0 - 52.0 % Final   MCV 05/15/2023 88.9  80.0 - 100.0 fL Final   MCH 05/15/2023 27.9  26.0 - 34.0 pg Final   MCHC 05/15/2023 31.4  30.0 - 36.0 g/dL Final   RDW 51/88/4166 12.8  11.5 - 15.5 % Final   Platelets 05/15/2023 273  150 - 400 K/uL Final   nRBC 05/15/2023 0.0  0.0 - 0.2 % Final   Neutrophils Relative % 05/15/2023 68  % Final   Neutro Abs 05/15/2023 5.1  1.7 - 7.7 K/uL Final   Lymphocytes Relative 05/15/2023 20  % Final   Lymphs Abs 05/15/2023 1.5  0.7 -  4.0 K/uL Final   Monocytes Relative 05/15/2023 9  % Final   Monocytes Absolute 05/15/2023 0.7  0.1 - 1.0 K/uL Final   Eosinophils Relative 05/15/2023 2  % Final   Eosinophils Absolute 05/15/2023 0.1  0.0 - 0.5 K/uL Final   Basophils Relative 05/15/2023 1  % Final   Basophils Absolute 05/15/2023 0.0  0.0 - 0.1 K/uL Final   Immature Granulocytes 05/15/2023 0  % Final   Abs Immature Granulocytes 05/15/2023 0.03  0.00 - 0.07 K/uL Final   Performed at [facility], [address] [place], [place] [address]   Alcohol, Ethyl (B) 05/15/2023 <10  <10 mg/dL Final   Comment: (NOTE) Lowest detectable limit for serum alcohol is 10 mg/dL.  For medical purposes only. Performed at [facility], [address], [place], [place] [address]    Sodium 05/15/2023 137  135 - 145 mmol/L Final   Potassium 05/15/2023 3.5  3.5 - 5.1 mmol/L Final   Chloride 05/15/2023 102  98 - 111 mmol/L Final   CO2 05/15/2023 27  22 - 32 mmol/L Final   Glucose, Bld 05/15/2023 89  70 - 99 mg/dL Final   Glucose reference range applies only to samples taken after fasting  for at least 8 hours.   BUN 05/15/2023 8  6 - 20 mg/dL Final   Creatinine, Ser 05/15/2023 0.95  0.61 - 1.24 mg/dL Final   Calcium 09/81/1914 9.1  8.9 - 10.3 mg/dL Final   GFR, Estimated 05/15/2023 >60  >60 mL/min Final   Comment: (NOTE) Calculated using the CKD-EPI Creatinine Equation (2021)    Anion gap 05/15/2023 8  5 - 15 Final   Performed at [facility], [address], [place], [place] [address]   Acetaminophen (Tylenol), Serum 05/15/2023 <10 (L)  10 - 30 ug/mL Final   Comment: (NOTE) Therapeutic concentrations vary significantly. A range of 10-30 ug/mL  may be an effective concentration for many patients. However, some  are best treated at concentrations outside of this range. Acetaminophen concentrations >150 ug/mL at 4 hours after ingestion  and >50 ug/mL at 12 hours after ingestion are often associated with  toxic reactions.  Performed at [facility], [address] [place], [place] [address]    Salicylate Lvl 05/15/2023 <7.0 (L)  7.0 - 30.0 mg/dL Final   Performed at [facility], [address], [place], [place] [address]    PSYCHIATRIC REVIEW OF SYSTEMS (ROS)  ROS: Notable for the following relevant positive findings: Review of Systems  Psychiatric/Behavioral:  Positive for hallucinations, substance abuse and suicidal ideas.     Additional findings:      Musculoskeletal: No abnormal movements observed      Gait & Station: Laying/Sitting      Pain Screening: Denies      Nutrition & Dental Concerns: Denies  RISK FORMULATION/ASSESSMENT  Is the patient experiencing any suicidal or homicidal ideations: Yes       Explain if yes: Endorses passive suicidal ideation, denies suicidal intent and plan. Denies homicidal ideation  Protective factors considered for safety management: Help seeking, ability to access resources, future oriented, identifies reasons to live   Risk factors/concerns considered for safety  management:  Substance abuse/dependence Male gender Unmarried  Is there a safety management plan with the patient and treatment team to minimize risk factors and promote protective factors: Yes           Explain: Resources for shelter; resources for outpatient  therapy and psychiatry; resources for outpatient substance use treatment; crisis line information; ED return precautions  Is crisis care placement or psychiatric hospitalization recommended: No     Based on my current evaluation and risk assessment, patient is determined at this time to be at:  Low risk  *RISK ASSESSMENT Risk assessment is a dynamic process; it is possible that this patient's condition, and risk level, may change. This should be re-evaluated and managed over time as appropriate. Please re-consult psychiatric consult services if additional assistance is needed in terms of risk assessment and management. If your team decides to discharge this patient, please advise the patient how to best access emergency psychiatric services, or to call 911, if their condition worsens or they feel unsafe in any way.  Aaron Rubio is a 42 year old male, houseless, with a history of schizophrenia, substance induced psychosis, alcohol, opioid, and stimulant use disorders, and hypertension who presents to the ED endorsing auditory hallucinations and passive suicidal ideation. Patient with frequent ED presentations at multiple emergency rooms for pain and shelter seeking with escalation of behavior when his perceived needs are not met. Seen by this writer 11/1 and when told he was being discharged escalated his behavior and said he was suicidal with a plan. He then re-presented to another [facility] hospital ED and was evaluated by a different psychiatrist and discharged. No UDS resulted, ethanol negative. Psychiatry consulted for disposition recommendations. On evaluation, patient noted to be resting comfortably with his eyes closed, in no distress.Patient  calm, cooperative, normoverbal, euthymic, linear with some underlying thought disorganization, not appearing internally preoccupied, not responding to internal stimuli, alert and oriented x 4. Patient endorses vague auditory hallucinations and paranoia. Denies command auditory hallucinations. Endorses passive suicidal ideation, denies suicidal intent and plan. Reports cocaine use today. States he has been unable to find shelter because he does not have an ID which the shelter requires. Patient's presentation concerning for unspecified psychosis with likely contribution from substance use and concern for secondary gain, with some evidence of thought disorganization, endorses auditory hallucinations though does not appear internally preoccupied, not responding to internal stimuli, and endorses vague paranoia. Patient reports passive suicidal ideation, denies suicidal intent and plan. Patient is a low risk for suicide due to protective factors including help seeking, ability to access resources, future oriented, identifies reasons to live. Patient able to articulate a plan for self care, not currently grossly disorganized. Therefore, inpatient psychiatric hospitalization is not recommended.    Adria Dill, MD Telepsychiatry Consult Services

## 2023-05-16 NOTE — ED Provider Notes (Signed)
I assumed care of this patient from previous provider.  Please see their note for further details of history, exam, and MDM.   Briefly patient is a 42 y.o. male who presented for psych evaluation.  Patient was medically cleared by Dr. Rodena Medin.  Evaluated by telepsychiatry and cleared for discharge.    The patient appears reasonably screened and/or stabilized for discharge and I doubt any other medical condition or other [facility] requiring further screening, evaluation, or treatment in the ED at this time. I have discussed the findings, Dx and Tx plan with the patient/family who expressed understanding and agree(s) with the plan. Discharge instructions discussed at length. The patient/family was given strict return precautions who verbalized understanding of the instructions. No further questions at time of discharge.  Disposition: Discharge  Condition: Good  ED Discharge Orders     None        Follow Up: [facility] Address: [address] [place], [place] [address] Hours: Open 24 hours Monday through Sunday Phone: [phone] Go to  as needed      Nira Conn, MD 05/16/23 [phone]

## 2023-05-17 ENCOUNTER — Encounter (HOSPITAL_COMMUNITY): Payer: Self-pay

## 2023-05-17 ENCOUNTER — Emergency Department (HOSPITAL_COMMUNITY)
Admission: EM | Admit: 2023-05-17 | Discharge: 2023-05-17 | Disposition: A | Discharge status: Home / Self Care | Payer: Medicaid Other | Attending: Emergency Medicine | Admitting: Emergency Medicine

## 2023-05-17 ENCOUNTER — Other Ambulatory Visit: Payer: Self-pay

## 2023-05-17 DIAGNOSIS — Z79899 Other long term (current) drug therapy: Secondary | ICD-10-CM | POA: Diagnosis not present

## 2023-05-17 DIAGNOSIS — R45851 Suicidal ideations: Secondary | ICD-10-CM | POA: Insufficient documentation

## 2023-05-17 DIAGNOSIS — I10 Essential (primary) hypertension: Secondary | ICD-10-CM | POA: Diagnosis not present

## 2023-05-17 DIAGNOSIS — R440 Auditory hallucinations: Secondary | ICD-10-CM | POA: Insufficient documentation

## 2023-05-17 DIAGNOSIS — F29 Unspecified psychosis not due to a substance or known physiological condition: Secondary | ICD-10-CM | POA: Diagnosis not present

## 2023-05-17 LAB — CBC
HCT: 39.6 % (ref 39.0–52.0)
Hemoglobin: 12.2 g/dL — ABNORMAL LOW (ref 13.0–17.0)
MCH: 27.4 pg (ref 26.0–34.0)
MCHC: 30.8 g/dL (ref 30.0–36.0)
MCV: 88.8 fL (ref 80.0–100.0)
Platelets: 263 10*3/uL (ref 150–400)
RBC: 4.46 MIL/uL (ref 4.22–5.81)
RDW: 12.9 % (ref 11.5–15.5)
WBC: 6.2 10*3/uL (ref 4.0–10.5)
nRBC: 0 % (ref 0.0–0.2)

## 2023-05-17 LAB — COMPREHENSIVE METABOLIC PANEL
ALT: 27 U/L (ref 0–44)
AST: 29 U/L (ref 15–41)
Albumin: 3.1 g/dL — ABNORMAL LOW (ref 3.5–5.0)
Alkaline Phosphatase: 61 U/L (ref 38–126)
Anion gap: 6 (ref 5–15)
BUN: 15 mg/dL (ref 6–20)
CO2: 26 mmol/L (ref 22–32)
Calcium: 8.7 mg/dL — ABNORMAL LOW (ref 8.9–10.3)
Chloride: 105 mmol/L (ref 98–111)
Creatinine, Ser: 1.29 mg/dL — ABNORMAL HIGH (ref 0.61–1.24)
GFR, Estimated: >60 mL/min (ref >60)
Glucose, Bld: 85 mg/dL (ref 70–99)
Potassium: 3.5 mmol/L (ref 3.5–5.1)
Sodium: 137 mmol/L (ref 135–145)
Total Bilirubin: 0.4 mg/dL (ref <1.2)
Total Protein: 5.6 g/dL — ABNORMAL LOW (ref 6.5–8.1)

## 2023-05-17 LAB — ETHANOL: Alcohol, Ethyl (B): <10 mg/dL (ref <10)

## 2023-05-17 NOTE — ED Provider Notes (Signed)
[place] EMERGENCY DEPARTMENT AT [facility] Provider Note  CSN: [id number] Arrival date & time: 05/17/23 0025  Chief Complaint(s) Psychiatric Evaluation  HPI Aaron Rubio is a 42 y.o. male with a past medical history listed below who presents to the emergency department for several days of suicidal ideations and auditory hallucinations.  No active plan and patient states that he is not brave enough to go through with it.  Denies any other physical complaints.  This is the patient is 13 ER visit in the last 3 weeks for various chief complaints.  He was actually seen and evaluated by psychiatry within the last 48 hours for the same and cleared for outpatient management.  HPI  Past Medical History Past Medical History:  Diagnosis Date   Hypertension    Schizophrenia [facility])    Patient Active Problem List   Diagnosis Date Noted   Passive suicidal ideations 05/05/2023   Psychosis (HCC) 05/05/2023   Polysubstance abuse (HCC) 05/05/2023   Home Medication(s) Prior to Admission medications   Medication Sig Start Date End Date Taking? Authorizing Provider  azithromycin (ZITHROMAX) 250 MG tablet Take 1 tablet (250 mg total) by mouth daily. Patient not taking: Reported on 05/04/2023 06/16/22   Pricilla Loveless, MD  benzonatate (TESSALON) 100 MG capsule Take 1 capsule (100 mg total) by mouth every 8 (eight) hours. Patient not taking: Reported on 05/04/2023 05/03/23   Darrick Grinder, PA-C  benztropine (COGENTIN) 2 MG tablet Take 1 tablet (2 mg total) by mouth 2 (two) times daily. Patient not taking: Reported on 05/04/2023 07/15/21 05/04/23  Long, Arlyss Repress, MD  cyclobenzaprine (FLEXERIL) 10 MG tablet Take 1 tablet (10 mg total) by mouth 2 (two) times daily as needed for muscle spasms. Patient not taking: Reported on 05/04/2023 06/04/22   Tegeler, Canary Brim, MD  hydrOXYzine (ATARAX) 25 MG tablet Take 1-2 tablets (25-50 mg total) by mouth See admin instructions. Take 25 mg by  mouth in the morning and 50 mg at bedtime Patient not taking: Reported on 05/04/2023 07/15/21   Long, Arlyss Repress, MD  metoprolol tartrate (LOPRESSOR) 25 MG tablet Take 1 tablet (25 mg total) by mouth in the morning. 05/07/23 06/06/23  Sabas Sous, MD  naproxen (NAPROSYN) 375 MG tablet Take 1 tablet (375 mg total) by mouth 2 (two) times daily. 05/07/23   Sabas Sous, MD  naproxen (NAPROSYN) 500 MG tablet Take 1 tablet (500 mg total) by mouth 2 (two) times daily. 05/09/23   Mannie Stabile, PA-C  ondansetron (ZOFRAN-ODT) 4 MG disintegrating tablet Take 1 tablet (4 mg total) by mouth every 8 (eight) hours as needed for nausea or vomiting. Patient not taking: Reported on 05/04/2023 06/15/22   Pricilla Loveless, MD  TYLENOL 500 MG tablet Take 500-1,000 mg by mouth every 6 (six) hours as needed for mild pain (pain score 1-3) or headache.    [provider]  Allergies Patient has no known allergies.  Review of Systems Review of Systems As noted in HPI  Physical Exam Vital Signs  I have reviewed the triage vital signs BP (!) 149/107 (BP Location: Right Arm)   Pulse 78   Temp 98.3 F (36.8 C)   Resp 16   SpO2 100%   Physical Exam Vitals reviewed.  Constitutional:      General: He is not in acute distress.    Appearance: He is well-developed. He is not diaphoretic.  HENT:     Head: Normocephalic and atraumatic.     Right Ear: External ear normal.     Left Ear: External ear normal.     Nose: Nose normal.     Mouth/Throat:     Mouth: Mucous membranes are moist.  Eyes:     General: No scleral icterus.    Conjunctiva/sclera: Conjunctivae normal.  Neck:     Trachea: Phonation normal.  Cardiovascular:     Rate and Rhythm: Normal rate and regular rhythm.  Pulmonary:     Effort: Pulmonary effort is normal. No respiratory distress.     Breath sounds:  No stridor.  Abdominal:     General: There is no distension.  Musculoskeletal:        General: Normal range of motion.     Cervical back: Normal range of motion.  Neurological:     Mental Status: He is alert and oriented to person, place, and time.  Psychiatric:        Behavior: Behavior normal.     ED Results and Treatments Labs (all labs ordered are listed, but only abnormal results are displayed) Labs Reviewed  COMPREHENSIVE METABOLIC PANEL - Abnormal; Notable for the following components:      Result Value   Creatinine, Ser 1.29 (*)    Calcium 8.7 (*)    Total Protein 5.6 (*)    Albumin 3.1 (*)    All other components within normal limits  CBC - Abnormal; Notable for the following components:   Hemoglobin 12.2 (*)    All other components within normal limits  ETHANOL  RAPID URINE DRUG SCREEN, HOSP PERFORMED                                                                                                                         EKG  EKG Interpretation Date/Time:    Ventricular Rate:    PR Interval:    QRS Duration:    QT Interval:    QTC Calculation:   R Axis:      Text Interpretation:         Radiology No results found.  Medications Ordered in ED Medications - No data to display Procedures Procedures  (including critical care time) Medical Decision Making / ED Course   Medical Decision Making Amount and/or Complexity of Data Reviewed Labs: ordered.    Patient presents for passive SI and hallucinations. Patient has already been cleared by psychiatry for the same within  the last 24 hours. Frequent visits raise concern for secondary gain for sheltering. Patient does not appear to be an immediate threat to himself or others at this time. No need for repeat [facility] emergent evaluation in the ER at this time. Medical screening labs from triage are reassuring.      Final Clinical Impression(s) / ED Diagnoses Final diagnoses:  Suicidal ideation   The  patient appears reasonably screened and/or stabilized for discharge and I doubt any other medical condition or other [facility] requiring further screening, evaluation, or treatment in the ED at this time. I have discussed the findings, Dx and Tx plan with the patient/family who expressed understanding and agree(s) with the plan. Discharge instructions discussed at length. The patient/family was given strict return precautions who verbalized understanding of the instructions. No further questions at time of discharge.  Disposition: Discharge  Condition: Good  ED Discharge Orders     None        Follow Up: [facility] Address: [address] [place], [place] [address] Hours: Open 24 hours Monday through Sunday Phone: [phone] Go to  as needed    This chart was dictated using voice recognition software.  Despite best efforts to proofread,  errors can occur which can change the documentation meaning.    Nira Conn, MD 05/17/23 [phone]

## 2023-05-17 NOTE — ED Triage Notes (Addendum)
Patient complaint of auditory hallucinations x 1 day.   Patient reports voices are sometimes telling him to do things but patient will not elaborate.   Patient also wants abrasion on right cheek evaluated.   Patient denies SI & HI thoughts.   Patient seen at [facility] on 05/15/23 for same.

## 2023-05-17 NOTE — ED Notes (Signed)
Pt wanded by PD.

## 2023-05-19 ENCOUNTER — Emergency Department (HOSPITAL_COMMUNITY): Payer: Medicaid Other

## 2023-05-19 ENCOUNTER — Emergency Department (HOSPITAL_COMMUNITY)
Admission: EM | Admit: 2023-05-19 | Discharge: 2023-05-19 | Disposition: A | Discharge status: Home / Self Care | Payer: No Typology Code available for payment source | Attending: Emergency Medicine | Admitting: Emergency Medicine

## 2023-05-19 ENCOUNTER — Other Ambulatory Visit: Payer: Self-pay

## 2023-05-19 ENCOUNTER — Encounter (HOSPITAL_COMMUNITY): Payer: Self-pay | Admitting: Emergency Medicine

## 2023-05-19 ENCOUNTER — Emergency Department (HOSPITAL_COMMUNITY)
Admission: EM | Admit: 2023-05-19 | Discharge: 2023-05-19 | Disposition: A | Discharge status: Home / Self Care | Payer: Medicaid Other | Attending: Emergency Medicine | Admitting: Emergency Medicine

## 2023-05-19 DIAGNOSIS — G8929 Other chronic pain: Secondary | ICD-10-CM | POA: Diagnosis not present

## 2023-05-19 DIAGNOSIS — M79605 Pain in left leg: Secondary | ICD-10-CM | POA: Diagnosis present

## 2023-05-19 DIAGNOSIS — M79604 Pain in right leg: Secondary | ICD-10-CM | POA: Diagnosis not present

## 2023-05-19 DIAGNOSIS — Z79899 Other long term (current) drug therapy: Secondary | ICD-10-CM | POA: Diagnosis not present

## 2023-05-19 DIAGNOSIS — M545 Low back pain, unspecified: Secondary | ICD-10-CM | POA: Insufficient documentation

## 2023-05-19 DIAGNOSIS — I10 Essential (primary) hypertension: Secondary | ICD-10-CM

## 2023-05-19 DIAGNOSIS — R030 Elevated blood-pressure reading, without diagnosis of hypertension: Secondary | ICD-10-CM

## 2023-05-19 DIAGNOSIS — R600 Localized edema: Secondary | ICD-10-CM | POA: Insufficient documentation

## 2023-05-19 DIAGNOSIS — R7989 Other specified abnormal findings of blood chemistry: Secondary | ICD-10-CM | POA: Diagnosis not present

## 2023-05-19 LAB — CBC WITH DIFFERENTIAL/PLATELET
Abs Immature Granulocytes: 0.02 10*3/uL (ref 0.00–0.07)
Basophils Absolute: 0.1 10*3/uL (ref 0.0–0.1)
Basophils Relative: 1 %
Eosinophils Absolute: 0.3 10*3/uL (ref 0.0–0.5)
Eosinophils Relative: 5 %
HCT: 40 % (ref 39.0–52.0)
Hemoglobin: 12.3 g/dL — ABNORMAL LOW (ref 13.0–17.0)
Immature Granulocytes: 0 %
Lymphocytes Relative: 26 %
Lymphs Abs: 1.5 10*3/uL (ref 0.7–4.0)
MCH: 26.7 pg (ref 26.0–34.0)
MCHC: 30.8 g/dL (ref 30.0–36.0)
MCV: 86.8 fL (ref 80.0–100.0)
Monocytes Absolute: 0.6 10*3/uL (ref 0.1–1.0)
Monocytes Relative: 11 %
Neutro Abs: 3.3 10*3/uL (ref 1.7–7.7)
Neutrophils Relative %: 57 %
Platelets: 225 10*3/uL (ref 150–400)
RBC: 4.61 MIL/uL (ref 4.22–5.81)
RDW: 12.9 % (ref 11.5–15.5)
WBC: 5.8 10*3/uL (ref 4.0–10.5)
nRBC: 0 % (ref 0.0–0.2)

## 2023-05-19 LAB — BASIC METABOLIC PANEL
Anion gap: 6 (ref 5–15)
BUN: 13 mg/dL (ref 6–20)
CO2: 28 mmol/L (ref 22–32)
Calcium: 9.1 mg/dL (ref 8.9–10.3)
Chloride: 105 mmol/L (ref 98–111)
Creatinine, Ser: 0.97 mg/dL (ref 0.61–1.24)
GFR, Estimated: >60 mL/min (ref >60)
Glucose, Bld: 86 mg/dL (ref 70–99)
Potassium: 3.8 mmol/L (ref 3.5–5.1)
Sodium: 139 mmol/L (ref 135–145)

## 2023-05-19 LAB — CBG MONITORING, ED: Glucose-Capillary: 76 mg/dL (ref 70–99)

## 2023-05-19 LAB — BRAIN NATRIURETIC PEPTIDE: B Natriuretic Peptide: 9.5 pg/mL (ref 0.0–100.0)

## 2023-05-19 LAB — TROPONIN I (HIGH SENSITIVITY)
Troponin I (High Sensitivity): 20 ng/L — ABNORMAL HIGH (ref <18)
Troponin I (High Sensitivity): 18 ng/L — ABNORMAL HIGH (ref <18)

## 2023-05-19 MED ORDER — ACETAMINOPHEN 500 MG PO TABS
1000.0000 mg | ORAL_TABLET | Freq: Once | ORAL | Status: AC
  Administered 2023-05-19: 1000 mg via ORAL
  Filled 2023-05-19: qty 2

## 2023-05-19 MED ORDER — IBUPROFEN 400 MG PO TABS
600.0000 mg | ORAL_TABLET | Freq: Once | ORAL | Status: AC
  Administered 2023-05-19: 600 mg via ORAL
  Filled 2023-05-19: qty 1

## 2023-05-19 MED ORDER — NAPROXEN 500 MG PO TABS: 500.0000 mg | ORAL_TABLET | Freq: Two times a day (BID) | ORAL | 0 refills | Status: DC

## 2023-05-19 NOTE — Discharge Instructions (Addendum)
Please read and follow all provided instructions.  Your diagnoses today include:  1. Chronic low back pain without sciatica, unspecified back pain laterality   2. Bilateral lower extremity edema   3. Elevated troponin     Tests performed today include: An EKG of your heart: no problems A chest x-ray was normal Cardiac enzymes - a blood test for heart muscle damage were slightly high, but not rising Blood counts and electrolytes Blood test for heart failure was normal Vital signs. See below for your results today.   Medications prescribed:  Naproxen - anti-inflammatory pain medication Do not exceed 500mg  naproxen every 12 hours, take with food  You have been prescribed an anti-inflammatory medication or NSAID. Take with food. Take smallest effective dose for the shortest duration needed for your pain. Stop taking if you experience stomach pain or vomiting.   Take any prescribed medications only as directed.  Follow-up instructions: Please follow-up with your primary care provider as soon as you can for further evaluation of your symptoms.   Return instructions:  SEEK IMMEDIATE MEDICAL ATTENTION IF: You have severe chest pain, especially if the pain is crushing or pressure-like and spreads to the arms, back, neck, or jaw, or if you have sweating, nausea or vomiting, or trouble with breathing. THIS IS AN EMERGENCY. Do not wait to see if the pain will go away. Get medical help at once. Call 911. DO NOT drive yourself to the hospital.  Your chest pain gets worse and does not go away after a few minutes of rest.  You have an attack of chest pain lasting longer than what you usually experience.  You have significant dizziness, if you pass out, or have trouble walking.  You have chest pain not typical of your usual pain for which you originally saw your caregiver.  You have any other emergent concerns regarding your health.  Your vital signs today were: BP 130/75 (BP Location: Right Arm)    Pulse 84   Temp 98.4 F (36.9 C)   Resp 16   Ht 6\' 2"  (1.88 m)   Wt 90.7 kg   SpO2 100%   BMI 25.68 kg/m  If your blood pressure (BP) was elevated above 135/85 this visit, please have this repeated by your doctor within one month. --------------

## 2023-05-19 NOTE — ED Provider Notes (Signed)
[place] EMERGENCY DEPARTMENT AT [facility] Provider Note   CSN: [id number] Arrival date & time: 05/19/23  2042     History  Chief Complaint  Patient presents with   Leg Swelling    Procopio Leonce Jett is a 42 y.o. male.  Patient c/o chronic intermittent pain to bilateral feet and legs. States when he is up on feet a lot he will get pain.  Symptoms occur even at rest - not related to walking, no claudication. Denies any focal area of swelling or redness. Denies specific injury. No back or radicular pain. No leg numbness or weakness. No fever or chills.   The history is provided by the patient and medical records. The history is limited by the condition of the patient.       Home Medications Prior to Admission medications   Medication Sig Start Date End Date Taking? Authorizing Provider  metoprolol tartrate (LOPRESSOR) 25 MG tablet Take 1 tablet (25 mg total) by mouth in the morning. 05/07/23 06/06/23  Sabas Sous, MD  naproxen (NAPROSYN) 500 MG tablet Take 1 tablet (500 mg total) by mouth 2 (two) times daily. 05/19/23   Renne Crigler, PA-C  TYLENOL 500 MG tablet Take 500-1,000 mg by mouth every 6 (six) hours as needed for mild pain (pain score 1-3) or headache.    [provider]      Allergies    Patient has no known allergies.    Review of Systems   Review of Systems  Constitutional:  Negative for fever.  Respiratory:  Negative for shortness of breath.   Cardiovascular:  Negative for chest pain.  Musculoskeletal:  Negative for back pain and neck pain.  Skin:  Negative for rash.  Neurological:  Negative for weakness and numbness.    Physical Exam Updated Vital Signs BP (!) 142/96   Pulse 76   Temp 98.2 F (36.8 C) (Oral)   Resp 15   Ht 1.88 m (6\' 2" )   Wt 81.6 kg   SpO2 100%   BMI 23.11 kg/m  Physical Exam Vitals and nursing note reviewed.  Constitutional:      Appearance: Normal appearance. He is well-developed.  HENT:      Head: Atraumatic.     Nose: Nose normal.     Mouth/Throat:     Mouth: Mucous membranes are moist.  Eyes:     General: No scleral icterus.    Conjunctiva/sclera: Conjunctivae normal.  Neck:     Trachea: No tracheal deviation.  Cardiovascular:     Rate and Rhythm: Normal rate.     Pulses: Normal pulses.  Pulmonary:     Effort: Pulmonary effort is normal. No accessory muscle usage or respiratory distress.     Breath sounds: Normal breath sounds.  Musculoskeletal:        General: No swelling or tenderness.     Cervical back: Neck supple.     Comments: Bilateral legs and feet of normal color and warmth. No swelling noted. Distal pulses palp bil. Good rom bil lower extremities without pain. No focal bony tenderness.   Skin:    General: Skin is warm and dry.     Findings: No rash.  Neurological:     Mental Status: He is alert.     Comments: Alert, speech clear. Motor/sens grossly intact bil. Steady gait.   Psychiatric:        Mood and Affect: Mood normal.     ED Results / Procedures / Treatments  Labs (all labs ordered are listed, but only abnormal results are displayed) Labs Reviewed - No data to display  EKG None  Radiology DG Chest 2 View  Result Date: 05/19/2023 CLINICAL DATA:  42 year old male with history of shortness of breath and cough. EXAM: CHEST - 2 VIEW COMPARISON:  Chest x-ray 05/13/2023. FINDINGS: Lung volumes are normal. No consolidative airspace disease. No pleural effusions. No pneumothorax. No pulmonary nodule or mass noted. Pulmonary vasculature and the cardiomediastinal silhouette are within normal limits. IMPRESSION: No radiographic evidence of acute cardiopulmonary disease. Electronically Signed   By: Trudie Reed M.D.   On: 05/19/2023 07:47    Procedures Procedures    Medications Ordered in ED Medications  acetaminophen (TYLENOL) tablet 1,000 mg (has no administration in time range)    ED Course/ Medical Decision Making/ A&P                                  Medical Decision Making Problems Addressed: Bilateral leg pain: acute illness or injury    Details: Acute/chronic Elevated blood pressure reading: acute illness or injury Essential hypertension: chronic illness or injury with exacerbation, progression, or side effects of treatment that poses a threat to life or bodily functions  Amount and/or Complexity of Data Reviewed External Data Reviewed: labs and notes. Labs: ordered. Decision-making details documented in ED Course.  Risk OTC drugs.   Reviewed nursing notes and prior charts for additional history.   Recent labs from today reviewed/interpreted by me - chem normal. Wbc and hct normal. Bnp normal.   Acetaminophen po. Po fluids/food.   Pt appears stable for d/c.   Rec pcp f/u.          Final Clinical Impression(s) / ED Diagnoses Final diagnoses:  None    Rx / DC Orders ED Discharge Orders     None         Cathren Laine, MD 05/19/23 2130

## 2023-05-19 NOTE — ED Triage Notes (Signed)
Pt reports leg swelling since yesterday. Reports hx of HTN but not taking medications for it. Pt reports some SOB with exertion and cough. Denies recent fevers.

## 2023-05-19 NOTE — ED Provider Notes (Signed)
[place] EMERGENCY DEPARTMENT AT [facility] Provider Note   CSN: [id number] Arrival date & time: 05/19/23  7829     History  Chief Complaint  Patient presents with   Leg Swelling    Aaron Rubio is a 42 y.o. male.  Patient presents to the emergency department today for evaluation of leg swelling and lower back pain.  Patient reports left-sided lower back pain.  He is concerned that he has something wrong with his tailbone.  He had imaging about 3 weeks ago which was negative.  Denies any new falls or injuries.  He also states that he has had some swelling of his legs.  Endorses standing a lot.  No chest pain or shortness of breath.  No history of heart failure. Patient denies risk factors for pulmonary embolism including: unilateral leg swelling, history of DVT/PE/other blood clots, use of exogenous hormones, recent immobilizations, recent surgery, recent travel (>4hr segment), malignancy, hemoptysis. Patient denies warning symptoms of back pain including: fecal incontinence, urinary retention or overflow incontinence, night sweats, waking from sleep with back pain, unexplained fevers or weight loss, h/o cancer, IVDU, recent trauma.            Home Medications Prior to Admission medications   Medication Sig Start Date End Date Taking? Authorizing Provider  azithromycin (ZITHROMAX) 250 MG tablet Take 1 tablet (250 mg total) by mouth daily. Patient not taking: Reported on 05/04/2023 06/16/22   Pricilla Loveless, MD  benzonatate (TESSALON) 100 MG capsule Take 1 capsule (100 mg total) by mouth every 8 (eight) hours. Patient not taking: Reported on 05/04/2023 05/03/23   Darrick Grinder, PA-C  benztropine (COGENTIN) 2 MG tablet Take 1 tablet (2 mg total) by mouth 2 (two) times daily. Patient not taking: Reported on 05/04/2023 07/15/21 05/04/23  Long, Arlyss Repress, MD  cyclobenzaprine (FLEXERIL) 10 MG tablet Take 1 tablet (10 mg total) by mouth 2 (two) times daily as needed  for muscle spasms. Patient not taking: Reported on 05/04/2023 06/04/22   Tegeler, Canary Brim, MD  hydrOXYzine (ATARAX) 25 MG tablet Take 1-2 tablets (25-50 mg total) by mouth See admin instructions. Take 25 mg by mouth in the morning and 50 mg at bedtime Patient not taking: Reported on 05/04/2023 07/15/21   Long, Arlyss Repress, MD  metoprolol tartrate (LOPRESSOR) 25 MG tablet Take 1 tablet (25 mg total) by mouth in the morning. 05/07/23 06/06/23  Sabas Sous, MD  naproxen (NAPROSYN) 375 MG tablet Take 1 tablet (375 mg total) by mouth 2 (two) times daily. 05/07/23   Sabas Sous, MD  naproxen (NAPROSYN) 500 MG tablet Take 1 tablet (500 mg total) by mouth 2 (two) times daily. 05/09/23   Mannie Stabile, PA-C  ondansetron (ZOFRAN-ODT) 4 MG disintegrating tablet Take 1 tablet (4 mg total) by mouth every 8 (eight) hours as needed for nausea or vomiting. Patient not taking: Reported on 05/04/2023 06/15/22   Pricilla Loveless, MD  TYLENOL 500 MG tablet Take 500-1,000 mg by mouth every 6 (six) hours as needed for mild pain (pain score 1-3) or headache.    [provider]      Allergies    Patient has no known allergies.    Review of Systems   Review of Systems  Physical Exam Updated Vital Signs BP (!) 146/108 (BP Location: Right Arm)   Pulse 86   Temp 98.4 F (36.9 C)   Resp 16   Ht 6\' 2"  (1.88 m)   Wt 90.7  kg   SpO2 100%   BMI 25.68 kg/m  Physical Exam Vitals and nursing note reviewed.  Constitutional:      General: He is not in acute distress.    Appearance: He is well-developed.  HENT:     Head: Normocephalic and atraumatic.  Eyes:     General:        Right eye: No discharge.        Left eye: No discharge.     Conjunctiva/sclera: Conjunctivae normal.  Cardiovascular:     Rate and Rhythm: Normal rate and regular rhythm.     Heart sounds: Normal heart sounds.  Pulmonary:     Effort: Pulmonary effort is normal.     Breath sounds: Normal breath sounds.  Abdominal:      Palpations: Abdomen is soft.     Tenderness: There is no abdominal tenderness. There is no right CVA tenderness or left CVA tenderness.  Musculoskeletal:        General: Normal range of motion.     Cervical back: Normal range of motion and neck supple.     Lumbar back: Tenderness present. No bony tenderness. Normal range of motion.       Back:     Right lower leg: Edema present.     Left lower leg: Edema present.     Comments: Patient with trace-1+ pitting edema to the bilateral ankles and lower legs.  No erythema.  No tenderness.  Left sided paraspinous tenderness in the sacral area.  No sign of abscess.  No skin signs of trauma.  No point tenderness over the lumbar spine or sacrum.  Skin:    General: Skin is warm and dry.  Neurological:     Mental Status: He is alert.     Sensory: No sensory deficit.     Motor: No abnormal muscle tone.     Deep Tendon Reflexes: Reflexes are normal and symmetric.     Comments: 5/5 strength in entire lower extremities bilaterally. No sensation deficit.   Psychiatric:        Mood and Affect: Mood normal.     ED Results / Procedures / Treatments   Labs (all labs ordered are listed, but only abnormal results are displayed) Labs Reviewed  CBC WITH DIFFERENTIAL/PLATELET - Abnormal; Notable for the following components:      Result Value   Hemoglobin 12.3 (*)    All other components within normal limits  TROPONIN I (HIGH SENSITIVITY) - Abnormal; Notable for the following components:   Troponin I (High Sensitivity) 20 (*)    All other components within normal limits  TROPONIN I (HIGH SENSITIVITY) - Abnormal; Notable for the following components:   Troponin I (High Sensitivity) 18 (*)    All other components within normal limits  BASIC METABOLIC PANEL  BRAIN NATRIURETIC PEPTIDE    EKG EKG Interpretation Date/Time:  Saturday May 19 2023 07:28:36 EST Ventricular Rate:  70 PR Interval:  180 QRS Duration:  84 QT Interval:  398 QTC  Calculation: 429 R Axis:   14  Text Interpretation: Normal sinus rhythm Normal ECG When compared with ECG of 05-May-2023 02:08, No significant change since last tracing Confirmed by Meridee Score [phone]) on 05/19/2023 7:45:00 AM  Radiology DG Chest 2 View  Result Date: 05/19/2023 CLINICAL DATA:  42 year old male with history of shortness of breath and cough. EXAM: CHEST - 2 VIEW COMPARISON:  Chest x-ray 05/13/2023. FINDINGS: Lung volumes are normal. No consolidative airspace disease. No pleural effusions. No pneumothorax.  No pulmonary nodule or mass noted. Pulmonary vasculature and the cardiomediastinal silhouette are within normal limits. IMPRESSION: No radiographic evidence of acute cardiopulmonary disease. Electronically Signed   By: Trudie Reed M.D.   On: 05/19/2023 07:47    Procedures Procedures    Medications Ordered in ED Medications  ibuprofen (ADVIL) tablet 600 mg (has no administration in time range)    ED Course/ Medical Decision Making/ A&P    Patient seen and examined. History obtained directly from patient. Work-up including labs, imaging, EKG ordered in triage, if performed, were reviewed.    Labs/EKG: Independently reviewed and interpreted.  This included: CBC with differential, shows normal white blood cell count and hemoglobin slightly low at 12.3 which is patient's baseline; basic metabolic panel is unremarkable; troponin elevated at 20.  Will need to check second troponin.  EKG without ischemic findings.  Imaging: Independently visualized and interpreted.  This included: Chest x-ray, agree negative.  Medications/Fluids: Ordered: P.o. ibuprofen  Most recent vital signs reviewed and are as follows: BP (!) 146/108 (BP Location: Right Arm)   Pulse 86   Temp 98.4 F (36.9 C)   Resp 16   Ht 6\' 2"  (1.88 m)   Wt 90.7 kg   SpO2 100%   BMI 25.68 kg/m   Initial impression: Lower leg swelling, bilateral, low concern for bilateral DVT.  No signs of  cellulitis.  Likely dependent edema.  Also lower back pain without red flags.  Will treat routinely.  Patient denies chest pain or shortness of breath or other potential anginal equivalents, but given elevated troponin will need to check second and will send BNP.  10:36 AM Reassessment performed. Patient appears stable, resting.  Labs personally reviewed and interpreted including: Troponin flat 20 >> 18.  BNP was normal at 9.5.  Reviewed pertinent lab work and imaging with patient at bedside. Questions answered.   Most current vital signs reviewed and are as follows: BP 130/75 (BP Location: Right Arm)   Pulse 84   Temp 98.4 F (36.9 C)   Resp 16   Ht 6\' 2"  (1.88 m)   Wt 90.7 kg   SpO2 100%   BMI 25.68 kg/m   Plan: Discharge to home.   Prescriptions written for: Naproxen  Other home care instructions discussed: Elevate legs  ED return instructions discussed: New or worsening symptoms, development of chest pain or shortness of breath.  Follow-up instructions discussed: Patient encouraged to follow-up with their PCP in 3 days.                                 Medical Decision Making Amount and/or Complexity of Data Reviewed Labs: ordered. Radiology: ordered.  Risk Prescription drug management.   Patient with lower back and sacral pain.  He has been seen for this in the past.  He had x-rays which were negative about a month ago.  No red flags lower back pain today.  He also complains of bilateral lower extremity edema.  Admits to spending a lot of time on his feet.  No signs of cellulitis.  Low concern for bilateral DVT.  Troponin was ordered in triage which was found to be elevated at 20.  This was rechecked and found to be flat with second value 18.  Given lower extremity edema, BNP was checked and this was normal.  Patient does not appear to be fluid overloaded.  His lungs are clear on auscultation and x-ray  is clear without signs of edema.  Feel that edema is likely dependent  in nature.  Encourage PCP follow-up.  Encouraged NSAIDs for lower back pain.  Normal renal function.  Liver function was not checked however do not suspect liver dysfunction given no history of cirrhosis.  LFTs were normal 2 days ago.        Final Clinical Impression(s) / ED Diagnoses Final diagnoses:  Chronic low back pain without sciatica, unspecified back pain laterality  Bilateral lower extremity edema  Elevated troponin    Rx / DC Orders ED Discharge Orders          Ordered    naproxen (NAPROSYN) 500 MG tablet  2 times daily        05/19/23 1013              Renne Crigler, PA-C 05/19/23 1040    Terrilee Files, MD 05/19/23 [phone]

## 2023-05-19 NOTE — ED Triage Notes (Signed)
Patient bib ems complaining of bilateral leg swelling and foot pain for 2-3 days. Patient seen at [facility] earlier today for same but states pain came back after walking outside.   BP 142/120 CBG 65, Spo2 99%

## 2023-05-19 NOTE — Discharge Instructions (Addendum)
It was our pleasure to provide your ER care today - we hope that you feel better.  Elevate legs when not up and about. Keep legs feel warm/dry. Take acetaminophen as need.   Your blood pressure is high today - follow up with primary care doctor in the next 1-2 weeks.   Return to ER if worse, new symptoms, fevers, severe leg pain, chest pain, trouble breathing, or other concern.

## 2023-05-19 NOTE — ED Notes (Signed)
Pt did not want to stay had to go to work.

## 2023-05-20 ENCOUNTER — Encounter (HOSPITAL_COMMUNITY): Payer: Self-pay

## 2023-05-20 ENCOUNTER — Ambulatory Visit (HOSPITAL_COMMUNITY)
Admission: EM | Admit: 2023-05-20 | Discharge: 2023-05-20 | Disposition: A | Discharge status: Home / Self Care | Payer: Medicaid Other | Attending: Psychiatry | Admitting: Psychiatry

## 2023-05-20 ENCOUNTER — Emergency Department (HOSPITAL_COMMUNITY)
Admission: EM | Admit: 2023-05-20 | Discharge: 2023-05-20 | Disposition: A | Discharge status: Home / Self Care | Payer: Medicaid Other | Attending: Emergency Medicine | Admitting: Emergency Medicine

## 2023-05-20 DIAGNOSIS — F32A Depression, unspecified: Secondary | ICD-10-CM | POA: Insufficient documentation

## 2023-05-20 DIAGNOSIS — G8929 Other chronic pain: Secondary | ICD-10-CM | POA: Insufficient documentation

## 2023-05-20 DIAGNOSIS — I10 Essential (primary) hypertension: Secondary | ICD-10-CM | POA: Insufficient documentation

## 2023-05-20 DIAGNOSIS — M7989 Other specified soft tissue disorders: Secondary | ICD-10-CM

## 2023-05-20 DIAGNOSIS — M79662 Pain in left lower leg: Secondary | ICD-10-CM | POA: Insufficient documentation

## 2023-05-20 DIAGNOSIS — K0889 Other specified disorders of teeth and supporting structures: Secondary | ICD-10-CM | POA: Diagnosis not present

## 2023-05-20 DIAGNOSIS — R5383 Other fatigue: Secondary | ICD-10-CM | POA: Insufficient documentation

## 2023-05-20 DIAGNOSIS — M79671 Pain in right foot: Secondary | ICD-10-CM

## 2023-05-20 DIAGNOSIS — M79604 Pain in right leg: Secondary | ICD-10-CM | POA: Insufficient documentation

## 2023-05-20 DIAGNOSIS — Z5900 Homelessness unspecified: Secondary | ICD-10-CM | POA: Insufficient documentation

## 2023-05-20 DIAGNOSIS — R2243 Localized swelling, mass and lump, lower limb, bilateral: Secondary | ICD-10-CM | POA: Insufficient documentation

## 2023-05-20 DIAGNOSIS — F209 Schizophrenia, unspecified: Secondary | ICD-10-CM | POA: Insufficient documentation

## 2023-05-20 DIAGNOSIS — M79661 Pain in right lower leg: Secondary | ICD-10-CM | POA: Diagnosis not present

## 2023-05-20 DIAGNOSIS — M79605 Pain in left leg: Secondary | ICD-10-CM | POA: Insufficient documentation

## 2023-05-20 DIAGNOSIS — Z79899 Other long term (current) drug therapy: Secondary | ICD-10-CM | POA: Insufficient documentation

## 2023-05-20 MED ORDER — IBUPROFEN 800 MG PO TABS
800.0000 mg | ORAL_TABLET | Freq: Once | ORAL | Status: AC
  Administered 2023-05-20: 800 mg via ORAL
  Filled 2023-05-20: qty 1

## 2023-05-20 MED ORDER — IBUPROFEN 200 MG PO TABS
600.0000 mg | ORAL_TABLET | Freq: Once | ORAL | Status: AC
  Administered 2023-05-20: 600 mg via ORAL
  Filled 2023-05-20: qty 3

## 2023-05-20 NOTE — Discharge Instructions (Addendum)
Please arrive tomorrow morning at 7:30am for a walk in appointment in the outpatient clinic upstairs.

## 2023-05-20 NOTE — ED Provider Notes (Signed)
  [place] EMERGENCY DEPARTMENT AT [facility] Provider Note   CSN: [id number] Arrival date & time: 05/20/23  1821     History {Add pertinent medical, surgical, social history, OB history to HPI:1} Chief Complaint  Patient presents with  . Leg Pain  . Leg Swelling    Aaron Rubio is a 42 y.o. male.   Leg Pain      Home Medications Prior to Admission medications   Medication Sig Start Date End Date Taking? Authorizing Provider  metoprolol tartrate (LOPRESSOR) 25 MG tablet Take 1 tablet (25 mg total) by mouth in the morning. 05/07/23 06/06/23  Sabas Sous, MD  naproxen (NAPROSYN) 500 MG tablet Take 1 tablet (500 mg total) by mouth 2 (two) times daily. 05/19/23   Renne Crigler, PA-C  TYLENOL 500 MG tablet Take 500-1,000 mg by mouth every 6 (six) hours as needed for mild pain (pain score 1-3) or headache.    [provider]      Allergies    Patient has no known allergies.    Review of Systems   Review of Systems  Physical Exam Updated Vital Signs BP (!) 154/93   Pulse 70   Temp 98.2 F (36.8 C) (Oral)   Resp 16   Ht 6\' 2"  (1.88 m)   Wt 81.6 kg   SpO2 98%   BMI 23.11 kg/m  Physical Exam  ED Results / Procedures / Treatments   Labs (all labs ordered are listed, but only abnormal results are displayed) Labs Reviewed - No data to display  EKG None  Radiology DG Chest 2 View  Result Date: 05/19/2023 CLINICAL DATA:  42 year old male with history of shortness of breath and cough. EXAM: CHEST - 2 VIEW COMPARISON:  Chest x-ray 05/13/2023. FINDINGS: Lung volumes are normal. No consolidative airspace disease. No pleural effusions. No pneumothorax. No pulmonary nodule or mass noted. Pulmonary vasculature and the cardiomediastinal silhouette are within normal limits. IMPRESSION: No radiographic evidence of acute cardiopulmonary disease. Electronically Signed   By: Trudie Reed M.D.   On: 05/19/2023 07:47    Procedures Procedures   {Document cardiac monitor, telemetry assessment procedure when appropriate:1}  Medications Ordered in ED Medications  ibuprofen (ADVIL) tablet 800 mg (has no administration in time range)    ED Course/ Medical Decision Making/ A&P   {   Click here for ABCD2, HEART and other calculatorsREFRESH Note before signing :1}                              Medical Decision Making Risk Prescription drug management.   ***  {Document critical care time when appropriate:1} {Document review of labs and clinical decision tools ie heart score, Chads2Vasc2 etc:1}  {Document your independent review of radiology images, and any outside records:1} {Document your discussion with family members, caretakers, and with consultants:1} {Document social determinants of health affecting pt's care:1} {Document your decision making why or why not admission, treatments were needed:1} Final Clinical Impression(s) / ED Diagnoses Final diagnoses:  Chronic pain of both lower extremities  Pain, dental    Rx / DC Orders ED Discharge Orders     None

## 2023-05-20 NOTE — ED Provider Notes (Signed)
Behavioral Health Urgent Care Medical Screening Exam  Patient Name: Eliott Amor MRN: [id number] Date of Evaluation: 05/20/23 Chief Complaint:  "I'm dealing with some depression" Diagnosis:  Final diagnoses:  Depression, unspecified depression type  Homelessness    History of Present illness: Harsha Alcivar Hepburn 42 y.o., male patient presented to [facility] as a walk in with complaints of fatigue, bilateral lower extremity pain and "dealing with some depression".  Patient says he spent 16 years in prison and has been in the [place] area since July.  He is homeless and is frustrated that the emergency departments are not letting him stay.  He has made more than 14 trips to the emergency department in the last 3 weeks.  Patient said he thinks he was diagnosed with schizophrenia, but was unable to offer details of symptoms or treatments.  Ezzard Standing, 42 y.o., male patient seen face to face by this provider, consulted with Dr. Dairl Ponder; and chart reviewed on 05/20/23.  On evaluation Harden Purgason reports he came to [facility] after a visit to [facility] Emergency Department earlier today.  Patient complained of chronic bilateral lower extremity pain, and lower back pain.  He also stated he is dealing with some depression and fatigue.  Patient expressed frustration that the hospitals won't let him stay.  He says he doesn't drink alcohol, but endorsed some cocaine use a couple days ago.  He said it is not a regular thing for him.  Patient said he is frustrated that he needs to use a phone an people don't want to help him.  He said he wants to get a job and doesn't know how to do that.  Patient is educated about outpatient services in general, outpatient walk in appointments at [facility] specifically, provided a list of area shelter resources, and provided information on IOP programs.     Patient specifically stated he was not suicidal or homicidal. He said "no, nothing like that".  He said  he is not experiencing any hallucinations.  Patient did ask what things people say when they are admitted.  Patient was told that staff "assess for admission criteria" and refused to offer specific suggestions.    During evaluation Demarko Lumbard is sitting in a chair in no acute distress.  He is alert, oriented x 4, calm, cooperative and attentive.  His mood is euthymic with congruent affect.  He has normal speech, and behavior.  Objectively there is no evidence of psychosis/mania or delusional thinking.  Patient is able to converse coherently, goal directed thoughts, no distractibility, or pre-occupation.  He denies suicidal/self-harm/homicidal ideation, psychosis, and paranoia.  Patient answered questions appropriately.    Patient is not suicidal or homicidal.  He is not psychotic and he is not intoxicated.  He does not meet criteria for inpatient psychiatric hospitalization.  Patient is psychiatrically cleared.  Flowsheet Row ED from 05/20/2023 in [facility] Most recent reading at 05/20/2023  2:40 PM ED from 05/20/2023 in [facility] Emergency Department at [facility] Most recent reading at 05/20/2023  7:19 AM ED from 05/19/2023 in [facility] Emergency Department at [facility] Most recent reading at 05/19/2023  8:52 PM  C-SSRS RISK CATEGORY No Risk No Risk No Risk       Psychiatric Specialty Exam  Presentation  General Appearance:Appropriate for Environment  Eye Contact:Good  Speech:Clear and Coherent; Normal Rate  Speech Volume:Normal  Handedness:Right   Mood and Affect  Mood: Euthymic  Affect:  Appropriate   Thought Process  Thought Processes: Coherent  Descriptions of Associations:Intact  Orientation:Full (Time, Place and Person)  Thought Content:Logical    Hallucinations:None  Ideas of Reference:None  Suicidal Thoughts:No Without Intent; Without Plan  Homicidal Thoughts:No   Sensorium   Memory: Immediate Good; Recent Good; Remote Good  Judgment: Fair  Insight: Shallow   Executive Functions  Concentration: Fair  Attention Span: Fair  Recall: Fair  Fund of Knowledge: Fair  Language: Fair   Psychomotor Activity  Psychomotor Activity: Normal   Assets  Assets: Manufacturing systems engineer; Leisure Time   Sleep  Sleep: Fair  Number of hours:  5   Physical Exam: Physical Exam Vitals and nursing note reviewed.  Eyes:     Pupils: Pupils are equal, round, and reactive to light.  Pulmonary:     Effort: Pulmonary effort is normal.  Skin:    General: Skin is dry.  Neurological:     Mental Status: He is alert and oriented to person, place, and time.    Review of Systems  Musculoskeletal:  Positive for back pain.       Bilateral lower extremity pain.  Psychiatric/Behavioral:  Positive for depression.   All other systems reviewed and are negative.  Blood pressure 139/77, pulse 80, temperature 98.6 F (37 C), temperature source Oral, resp. rate 18, SpO2 100%. There is no height or weight on file to calculate BMI.  Musculoskeletal: Strength & Muscle Tone: within normal limits Gait & Station: normal Patient leans: N/A   [facility] MSE Discharge Disposition for Follow up and Recommendations: Based on my evaluation the patient does not appear to have an emergency medical condition and can be discharged with resources and follow up care in outpatient services for Medication Management and Individual Therapy   Thomes Lolling, NP 05/20/2023, 2:54 PM

## 2023-05-20 NOTE — ED Triage Notes (Signed)
Pt continues to c/o recurrent BLE pain/swelling and nail discoloration x5 days.  Pain score 7/10.  Pt has been seen numerous times for same, including this morning.   Pt reports "I was seen at the [facility] and they told me to come back to the ED to spend the night."

## 2023-05-20 NOTE — Progress Notes (Signed)
   05/20/23 1422  [facility] Triage Screening (Walk-ins at [facility] only)  How Did You Hear About [place]? Hospital Discharge  What Is the Reason for Your Visit/Call Today? Aaron Rubio is a 42 year old male who presents voluntarily to [facility] unaccompanied seeking an evaluation. Pt states he was referred to this facility by [name] for psychiatric services. He reports he was seen at [facility] and discharged on 11/14. Pt states he is trying to get re-established with psychiatric services because he was incarcerated for 16 years and was released last year. He states he has not been established with outpatient or psychiatry services. Pt states he believes he was diagnosed with Schizophrenia but is unsure of the specific diagnosis. Pt reports fatigue and leg swelling from walking. Pt reports cocaine use 2 days ago. He denies any other substance use. Pt states he is homeless. Pt denies SI/HI and AVH.  How Long Has This Been Causing You Problems? <Week  Have You Recently Had Any Thoughts About Hurting Yourself? No  Are You Planning to Commit Suicide/Harm Yourself At This time? No  Have you Recently Had Thoughts About Hurting Someone Karolee Ohs? No  Are You Planning To Harm Someone At This Time? No  Are you currently experiencing any auditory, visual or other hallucinations? No  Have You Used Any Alcohol or Drugs in the Past 24 Hours? No  How long ago did you use Drugs or Alcohol? 2 days ago  What Did You Use and How Much? cocaine use  Do you have any current medical co-morbidities that require immediate attention? No  Clinician description of patient physical appearance/behavior: calm, cooperative  What Do You Feel Would Help You the Most Today? Treatment for Depression or other mood problem;Housing Assistance  If access to [facility] Urgent Care was not available, would you have sought care in the Emergency Department? No  Determination of Need Routine (7 days)  Options For Referral Outpatient Therapy;Medication Management

## 2023-05-20 NOTE — ED Triage Notes (Addendum)
Pt c/o recurrent bilateral low extremity swelling and nail discoloration x5days.  Pain score 10/10.  Pt has been seen numerous times for same.    Pt easily speaking full sentences.  NAD noted.

## 2023-05-20 NOTE — Discharge Instructions (Signed)
Please follow-up with [facility] wellness when you are able.  Keep your legs elevated when able.  Use over-the-counter medications such as Tylenol, ibuprofen or Aleve for pain.  Try to keep your feet dry.

## 2023-05-20 NOTE — ED Provider Notes (Signed)
[place] EMERGENCY DEPARTMENT AT [facility] Provider Note   CSN: [id number] Arrival date & time: 05/20/23  0703     History  Chief Complaint  Patient presents with   Leg Pain    Aaron Rubio is a 42 y.o. male.  Patient presents to the emergency department today for evaluation of continued lower extremity swelling and bilateral foot pain.  Patient was seen by myself yesterday morning and had lab workup performed which showed mildly elevated troponins but flat trend, and normal BNP.  Patient was seen in the emergency department last night and discharged.  He slept in the lobby and then when asked to leave this morning, he checked back in with the same complaint.  Denies any new symptoms.  He reports bilateral foot pain.  He is concerned that his feet and toenails are discolored.  He states that hurts to walk.  Patient currently has housing instability.       Home Medications Prior to Admission medications   Medication Sig Start Date End Date Taking? Authorizing Provider  metoprolol tartrate (LOPRESSOR) 25 MG tablet Take 1 tablet (25 mg total) by mouth in the morning. 05/07/23 06/06/23  Sabas Sous, MD  naproxen (NAPROSYN) 500 MG tablet Take 1 tablet (500 mg total) by mouth 2 (two) times daily. 05/19/23   Renne Crigler, PA-C  TYLENOL 500 MG tablet Take 500-1,000 mg by mouth every 6 (six) hours as needed for mild pain (pain score 1-3) or headache.    [provider]      Allergies    Patient has no known allergies.    Review of Systems   Review of Systems  Physical Exam Updated Vital Signs Ht 6\' 2"  (1.88 m)   Wt 81.6 kg   BMI 23.11 kg/m  Physical Exam Vitals and nursing note reviewed.  Constitutional:      Appearance: He is well-developed.  HENT:     Head: Normocephalic and atraumatic.  Eyes:     Conjunctiva/sclera: Conjunctivae normal.  Cardiovascular:     Pulses:          Dorsalis pedis pulses are 2+ on the right side and 2+ on the  left side.  Pulmonary:     Effort: No respiratory distress.  Musculoskeletal:     Cervical back: Normal range of motion and neck supple.  Skin:    General: Skin is warm and dry.     Comments: Mild erythema of bilateral feet without signs of cellulitis.  Patient has chronic appearing discoloration, possible subungual hematoma that is not acute, of the great toes and second toes bilaterally.  I do not see any significant tinea pedis.  Do not see any open wounds or sores.  Patient has trace to 1+ edema of the feet and ankles which is consistent with exam yesterday.  No signs of lymphangitis.  Neurological:     Mental Status: He is alert.     ED Results / Procedures / Treatments   Labs (all labs ordered are listed, but only abnormal results are displayed) Labs Reviewed - No data to display  EKG None  Radiology DG Chest 2 View  Result Date: 05/19/2023 CLINICAL DATA:  42 year old male with history of shortness of breath and cough. EXAM: CHEST - 2 VIEW COMPARISON:  Chest x-ray 05/13/2023. FINDINGS: Lung volumes are normal. No consolidative airspace disease. No pleural effusions. No pneumothorax. No pulmonary nodule or mass noted. Pulmonary vasculature and the cardiomediastinal silhouette are within normal limits. IMPRESSION: No  radiographic evidence of acute cardiopulmonary disease. Electronically Signed   By: Trudie Reed M.D.   On: 05/19/2023 07:47    Procedures Procedures    Medications Ordered in ED Medications  ibuprofen (ADVIL) tablet 600 mg (600 mg Oral Given 05/20/23 3244)    ED Course/ Medical Decision Making/ A&P    Patient seen and examined. History obtained directly from patient.  Labs/EKG: None ordered  Imaging: None ordered  Medications/Fluids: Ordered: Ibuprofen 600 mg p.o.  Most recent vital signs reviewed and are as follows: BP (!) 150/95 (BP Location: Left Arm)   Pulse 69   Temp 98.3 F (36.8 C)   Resp 16   Ht 6\' 2"  (1.88 m)   Wt 81.6 kg   SpO2  100%   BMI 23.11 kg/m   Initial impression: Bilateral foot pain and lower extremity edema, chronic  Home treatment plan: NSAIDs, elevation of the legs  Return instructions discussed with patient: New or worsening symptoms  Follow-up instructions discussed with patient: Community health and wellness                                Medical Decision Making Risk OTC drugs.   Patient with bilateral foot pain and lower extremity swelling.  Likely dependent edema.  Low concern for bilateral DVT.  No significant signs of cellulitis today.  Lower extremities are well-perfused.  No open sores or wounds noted.  No indication for antibiotics.  Patient's condition is affected by his housing instability which makes it difficult to obtain routine care.  Discussed with patient that I do not feel he has an emergency medical condition today that would require further stabilization or workup.  Will plan for discharge.        Final Clinical Impression(s) / ED Diagnoses Final diagnoses:  Pain in both feet  Leg swelling    Rx / DC Orders ED Discharge Orders     None         Renne Crigler, PA-C 05/20/23 0102    Wynetta Fines, MD 05/20/23 [phone]

## 2023-05-20 NOTE — ED Notes (Signed)
ED Provider at bedside. 

## 2023-10-19 ENCOUNTER — Other Ambulatory Visit: Payer: Self-pay

## 2023-10-19 ENCOUNTER — Ambulatory Visit (HOSPITAL_COMMUNITY)
Admission: EM | Admit: 2023-10-19 | Discharge: 2023-10-22 | Disposition: A | Discharge status: Home / Self Care | Attending: Psychiatry | Admitting: Psychiatry

## 2023-10-19 DIAGNOSIS — Z560 Unemployment, unspecified: Secondary | ICD-10-CM | POA: Insufficient documentation

## 2023-10-19 DIAGNOSIS — Z5900 Homelessness unspecified: Secondary | ICD-10-CM | POA: Insufficient documentation

## 2023-10-19 DIAGNOSIS — Z652 Problems related to release from prison: Secondary | ICD-10-CM | POA: Insufficient documentation

## 2023-10-19 DIAGNOSIS — R9431 Abnormal electrocardiogram [ECG] [EKG]: Secondary | ICD-10-CM | POA: Insufficient documentation

## 2023-10-19 DIAGNOSIS — Z79899 Other long term (current) drug therapy: Secondary | ICD-10-CM | POA: Insufficient documentation

## 2023-10-19 DIAGNOSIS — F29 Unspecified psychosis not due to a substance or known physiological condition: Secondary | ICD-10-CM | POA: Insufficient documentation

## 2023-10-19 LAB — CBC WITH DIFFERENTIAL/PLATELET
Abs Immature Granulocytes: 0.1 10*3/uL — ABNORMAL HIGH (ref 0.00–0.07)
Basophils Absolute: 0.1 10*3/uL (ref 0.0–0.1)
Basophils Relative: 1 %
Eosinophils Absolute: 0.6 10*3/uL — ABNORMAL HIGH (ref 0.0–0.5)
Eosinophils Relative: 5 %
HCT: 46.1 % (ref 39.0–52.0)
Hemoglobin: 14.6 g/dL (ref 13.0–17.0)
Immature Granulocytes: 1 %
Lymphocytes Relative: 21 %
Lymphs Abs: 2.3 10*3/uL (ref 0.7–4.0)
MCH: 26.1 pg (ref 26.0–34.0)
MCHC: 31.7 g/dL (ref 30.0–36.0)
MCV: 82.5 fL (ref 80.0–100.0)
Monocytes Absolute: 1 10*3/uL (ref 0.1–1.0)
Monocytes Relative: 10 %
Neutro Abs: 6.8 10*3/uL (ref 1.7–7.7)
Neutrophils Relative %: 62 %
Platelets: 275 10*3/uL (ref 150–400)
RBC: 5.59 MIL/uL (ref 4.22–5.81)
RDW: 13.2 % (ref 11.5–15.5)
WBC: 10.8 10*3/uL — ABNORMAL HIGH (ref 4.0–10.5)
nRBC: 0 % (ref 0.0–0.2)

## 2023-10-19 LAB — COMPREHENSIVE METABOLIC PANEL WITH GFR
ALT: 74 U/L — ABNORMAL HIGH (ref 0–44)
AST: 117 U/L — ABNORMAL HIGH (ref 15–41)
Albumin: 4.3 g/dL (ref 3.5–5.0)
Alkaline Phosphatase: 55 U/L (ref 38–126)
Anion gap: 12 (ref 5–15)
BUN: 15 mg/dL (ref 6–20)
CO2: 28 mmol/L (ref 22–32)
Calcium: 10.6 mg/dL — ABNORMAL HIGH (ref 8.9–10.3)
Chloride: 100 mmol/L (ref 98–111)
Creatinine, Ser: 1.19 mg/dL (ref 0.61–1.24)
GFR, Estimated: >60 mL/min (ref >60)
Glucose, Bld: 81 mg/dL (ref 70–99)
Potassium: 4.8 mmol/L (ref 3.5–5.1)
Sodium: 140 mmol/L (ref 135–145)
Total Bilirubin: 0.9 mg/dL (ref 0.0–1.2)
Total Protein: 7 g/dL (ref 6.5–8.1)

## 2023-10-19 LAB — MAGNESIUM: Magnesium: 2.2 mg/dL (ref 1.7–2.4)

## 2023-10-19 LAB — LIPID PANEL
Cholesterol: 152 mg/dL (ref 0–200)
HDL: 57 mg/dL (ref >40)
LDL Cholesterol: 66 mg/dL (ref 0–99)
Total CHOL/HDL Ratio: 2.7 ratio
Triglycerides: 144 mg/dL (ref <150)
VLDL: 29 mg/dL (ref 0–40)

## 2023-10-19 MED ORDER — LORAZEPAM 2 MG/ML IJ SOLN: 2.0000 mg | Freq: Three times a day (TID) | INTRAMUSCULAR | Status: DC | PRN

## 2023-10-19 MED ORDER — DIPHENHYDRAMINE HCL 50 MG/ML IJ SOLN: 50.0000 mg | Freq: Three times a day (TID) | INTRAMUSCULAR | Status: DC | PRN

## 2023-10-19 MED ORDER — HYDROXYZINE HCL 25 MG PO TABS
25.0000 mg | ORAL_TABLET | Freq: Three times a day (TID) | ORAL | Status: DC | PRN
  Administered 2023-10-20: 25 mg via ORAL
  Filled 2023-10-19: qty 1

## 2023-10-19 MED ORDER — LORAZEPAM 1 MG PO TABS
2.0000 mg | ORAL_TABLET | Freq: Once | ORAL | Status: AC
  Administered 2023-10-19: 2 mg via ORAL
  Filled 2023-10-19: qty 2

## 2023-10-19 MED ORDER — MAGNESIUM HYDROXIDE 400 MG/5ML PO SUSP: 30.0000 mL | Freq: Every day | ORAL | Status: DC | PRN

## 2023-10-19 MED ORDER — HALOPERIDOL 5 MG PO TABS: 5.0000 mg | ORAL_TABLET | Freq: Three times a day (TID) | ORAL | Status: DC | PRN

## 2023-10-19 MED ORDER — ALUM & MAG HYDROXIDE-SIMETH 200-200-20 MG/5ML PO SUSP: 30.0000 mL | ORAL | Status: DC | PRN

## 2023-10-19 MED ORDER — DIPHENHYDRAMINE HCL 50 MG PO CAPS: 50.0000 mg | ORAL_CAPSULE | Freq: Three times a day (TID) | ORAL | Status: DC | PRN

## 2023-10-19 MED ORDER — ACETAMINOPHEN 325 MG PO TABS: 650.0000 mg | ORAL_TABLET | Freq: Four times a day (QID) | ORAL | Status: DC | PRN

## 2023-10-19 MED ORDER — TRAZODONE HCL 50 MG PO TABS: 50.0000 mg | ORAL_TABLET | Freq: Every evening | ORAL | Status: DC | PRN

## 2023-10-19 MED ORDER — HALOPERIDOL LACTATE 5 MG/ML IJ SOLN: 5.0000 mg | Freq: Three times a day (TID) | INTRAMUSCULAR | Status: DC | PRN

## 2023-10-19 MED ORDER — HALOPERIDOL LACTATE 5 MG/ML IJ SOLN: 10.0000 mg | Freq: Three times a day (TID) | INTRAMUSCULAR | Status: DC | PRN

## 2023-10-19 MED ORDER — HALOPERIDOL 5 MG PO TABS
5.0000 mg | ORAL_TABLET | Freq: Two times a day (BID) | ORAL | Status: DC
  Administered 2023-10-19: 5 mg via ORAL
  Filled 2023-10-19: qty 1

## 2023-10-19 MED ORDER — MIRTAZAPINE 15 MG PO TABS
15.0000 mg | ORAL_TABLET | Freq: Every day | ORAL | Status: DC
  Administered 2023-10-19 – 2023-10-21 (×3): 15 mg via ORAL
  Filled 2023-10-19 (×3): qty 1
  Filled 2023-10-19: qty 7

## 2023-10-19 NOTE — ED Notes (Signed)
 Patient resting quietly in bed with eyes closed. Respirations equal and unlabored, skin warm and dry, NAD. Routine safety checks conducted according to facility protocol. Will continue to monitor for safety.

## 2023-10-19 NOTE — ED Notes (Signed)
 Patient A&Ox4. Patient reports passive SI. Patient states "If I had a plan I could not complete it in here". . Patient contract for safety while on unit. Patient denies HI and AVH. Patient oriented to unit. Meal and beverage given. Patient denies any physical complaints when asked. No acute distress noted. Support and encouragement provided. Routine safety checks conducted according to facility protocol. Encouraged patient to notify staff if thoughts of harm toward self or others arise. Patient verbalize understanding and agreement. Will continue to monitor for safety.

## 2023-10-19 NOTE — ED Provider Notes (Signed)
 [facility] Continuous Assessment Admission H&P  Date: 10/20/23 Patient Name: Aaron Rubio MRN: [id number] Chief Complaint: suicidal ideation  Diagnoses:  Final diagnoses:  Psychosis, unspecified psychosis type (HCC)  Homelessness    HPI:  Aaron Rubio is a 43 y/o male with a history of psychosis, schizophrenia polysubstance abuse, homelessness presenting to [facility] as a walk in unaccompanied with complaints of hallucinations, suicidal ideations and homicidal ideations.   Aaron Rubio, 43 y.o., male patient seen face to face by this Nurse Practitioner and chart reviewed on 10/20/23.  On evaluation Aaron Rubio presents with rambling speech, is very labile and appears to have some thought blocking.  Patient does not answer questions directly and was hyper focused on this writer during the assessment. When asked about his upbringing and where he lived he started questioning this provider about where she lives and wanted to know if I was from this country. Patient made bizarre statements about Trump. Bush and JFK. Patient stated that he saw shadows and the shadows were putting spiders on his arms and then pointed to his arms.  Patient states that he has not been sleeping, eating and that he does not have any family.  Per [facility] review [facility], [facility] with suicidal thoughts with a plan to hang himself.  Patient was recommended for inpatient treatment at that time.  Is unclear if patient received inpatient psychiatric treatment at that time or not.  Patient's medications are listed as Haldol  5 mg twice daily, Remeron  15 mg nightly and Cogentin  1 mg twice daily. Will restart patient medications.   During evaluation Aaron Rubio is sitting in the assessment room in no acute distress.  He is alert, oriented x 4, calm, cooperative and attentive. His mood is agitated with congruent affect.  He has delayed speech and appears  to have thought blocking and is easily distracted. Patient appears to be responding to internal stimuli and is paranoid that shadows are putting spiders on his skin.   Patient recommended for inpatient treatment, but will be admitted to [facility] continuous observation for crisis management, safety and stabilization until an inpatient bed can be identified.    Total Time spent with patient: 20 minutes  Musculoskeletal  Strength & Muscle Tone: within normal limits Gait & Station: normal Patient leans: N/A  Psychiatric Specialty Exam  Presentation General Appearance:  Casual  Eye Contact: Fleeting  Speech: Slow  Speech Volume: Normal  Handedness: Right   Mood and Affect  Mood: Labile  Affect: Blunt   Thought Process  Thought Processes: Disorganized  Descriptions of Associations:Loose  Orientation:Partial  Thought Content:Delusions; Paranoid Ideation; Tangential; Scattered; Illogical  Diagnosis of Schizophrenia or Schizoaffective disorder in past: Yes  Duration of Psychotic Symptoms: N/A  Hallucinations:Hallucinations: Tactile (shadows putting spiders on his skin)  Ideas of Reference:Paranoia  Suicidal Thoughts:Suicidal Thoughts: No  Homicidal Thoughts:Homicidal Thoughts: No   Sensorium  Memory: Immediate Fair  Judgment: Poor  Insight: Poor   Executive Functions  Concentration: Poor  Attention Span: Poor  Recall: Poor  Fund of Knowledge: Poor  Language: Poor   Psychomotor Activity  Psychomotor Activity: Psychomotor Activity: Normal   Assets  Assets: Manufacturing systems engineer; Physical Health; Resilience   Sleep  Sleep: Sleep: Poor Number of Hours of Sleep: 3   Nutritional Assessment (For OBS and FBC admissions only) Has the patient had a weight loss or gain of 10 pounds or more in the last 3 months?:  No Has the patient had a decrease in food intake/or appetite?: No Does the patient have dental problems?: No Does the  patient have eating habits or behaviors that may be indicators of an eating disorder including binging or inducing vomiting?: No Has the patient recently lost weight without trying?: 0 Has the patient been eating poorly because of a decreased appetite?: 0 Malnutrition Screening Tool Score: 0    Physical Exam HENT:     Head: Normocephalic.     Nose: Nose normal.  Eyes:     Pupils: Pupils are equal, round, and reactive to light.  Cardiovascular:     Rate and Rhythm: Normal rate.  Pulmonary:     Effort: Pulmonary effort is normal.  Abdominal:     General: Abdomen is flat.  Musculoskeletal:        General: Normal range of motion.  Skin:    General: Skin is warm.  Neurological:     Mental Status: He is alert and oriented to person, place, and time.  Psychiatric:        Attention and Perception: He is inattentive.        Mood and Affect: Affect is labile.        Speech: Speech is delayed and tangential.        Behavior: Behavior is agitated and slowed.        Thought Content: Thought content is paranoid and delusional.        Judgment: Judgment is impulsive.    Review of Systems  Constitutional: Negative.   HENT: Negative.    Eyes: Negative.   Respiratory: Negative.    Cardiovascular: Negative.   Gastrointestinal: Negative.   Genitourinary: Negative.   Musculoskeletal: Negative.   Skin: Negative.   Neurological: Negative.   Endo/Heme/Allergies: Negative.   Psychiatric/Behavioral:  Positive for hallucinations.     Blood pressure 126/86, pulse 89, temperature 98.3 F (36.8 C), temperature source Oral, resp. rate 20, SpO2 100%. There is no height or weight on file to calculate BMI.  Past Psychiatric History: Psychosis, polysubstance abuse, schizophrenia  Is the patient at risk to self? No  Has the patient been a risk to self in the past 6 months? No .    Has the patient been a risk to self within the distant past? No   Is the patient a risk to others? No   Has the  patient been a risk to others in the past 6 months? No   Has the patient been a risk to others within the distant past? No   Past Medical History: Hypertension,   Family History: Unknown family history  Social History: 43 year old male, homeless, recently released from prison  Last Labs:  Admission on 10/19/2023  Component Date Value Ref Range Status   WBC 10/19/2023 10.8 (H)  4.0 - 10.5 K/uL Final   RBC 10/19/2023 5.59  4.22 - 5.81 MIL/uL Final   Hemoglobin 10/19/2023 14.6  13.0 - 17.0 g/dL Final   HCT 16/04/9603 46.1  39.0 - 52.0 % Final   MCV 10/19/2023 82.5  80.0 - 100.0 fL Final   MCH 10/19/2023 26.1  26.0 - 34.0 pg Final   MCHC 10/19/2023 31.7  30.0 - 36.0 g/dL Final   RDW 54/03/8118 13.2  11.5 - 15.5 % Final   Platelets 10/19/2023 275  150 - 400 K/uL Final   nRBC 10/19/2023 0.0  0.0 - 0.2 % Final   Neutrophils Relative % 10/19/2023 62  % Final   Neutro Abs 10/19/2023  6.8  1.7 - 7.7 K/uL Final   Lymphocytes Relative 10/19/2023 21  % Final   Lymphs Abs 10/19/2023 2.3  0.7 - 4.0 K/uL Final   Monocytes Relative 10/19/2023 10  % Final   Monocytes Absolute 10/19/2023 1.0  0.1 - 1.0 K/uL Final   Eosinophils Relative 10/19/2023 5  % Final   Eosinophils Absolute 10/19/2023 0.6 (H)  0.0 - 0.5 K/uL Final   Basophils Relative 10/19/2023 1  % Final   Basophils Absolute 10/19/2023 0.1  0.0 - 0.1 K/uL Final   Immature Granulocytes 10/19/2023 1  % Final   Abs Immature Granulocytes 10/19/2023 0.10 (H)  0.00 - 0.07 K/uL Final   Performed at [facility], [address] [place], [place] [address]   Sodium 10/19/2023 140  135 - 145 mmol/L Final   Potassium 10/19/2023 4.8  3.5 - 5.1 mmol/L Final   Chloride 10/19/2023 100  98 - 111 mmol/L Final   CO2 10/19/2023 28  22 - 32 mmol/L Final   Glucose, Bld 10/19/2023 81  70 - 99 mg/dL Final   Glucose reference range applies only to samples taken after fasting for at least 8 hours.   BUN 10/19/2023 15  6 - 20 mg/dL Final   Creatinine, Ser  10/19/2023 1.19  0.61 - 1.24 mg/dL Final   Calcium 60/45/4098 10.6 (H)  8.9 - 10.3 mg/dL Final   Total Protein 11/91/4782 7.0  6.5 - 8.1 g/dL Final   Albumin 95/62/1308 4.3  3.5 - 5.0 g/dL Final   AST 65/78/4696 117 (H)  15 - 41 U/L Final   ALT 10/19/2023 74 (H)  0 - 44 U/L Final   Alkaline Phosphatase 10/19/2023 55  38 - 126 U/L Final   Total Bilirubin 10/19/2023 0.9  0.0 - 1.2 mg/dL Final   GFR, Estimated 10/19/2023 >60  >60 mL/min Final   Comment: (NOTE) Calculated using the CKD-EPI Creatinine Equation (2021)    Anion gap 10/19/2023 12  5 - 15 Final   Performed at [facility], [address], [place], [place] [address]   Cholesterol 10/19/2023 152  0 - 200 mg/dL Final   Triglycerides 41/32/4401 144  <150 mg/dL Final   HDL 02/72/5366 57  >40 mg/dL Final   Total CHOL/HDL Ratio 10/19/2023 2.7  RATIO Final   VLDL 10/19/2023 29  0 - 40 mg/dL Final   LDL Cholesterol 10/19/2023 66  0 - 99 mg/dL Final   Comment:        Total Cholesterol/HDL:CHD Risk Coronary Heart Disease Risk Table                     Men   Women  1/2 Average Risk   3.4   3.3  Average Risk       5.0   4.4  2 X Average Risk   9.6   7.1  3 X Average Risk  23.4   11.0        Use the calculated Patient Ratio above and the CHD Risk Table to determine the patient's CHD Risk.        ATP III CLASSIFICATION (LDL):  <100     mg/dL   Optimal  440-347  mg/dL   Near or Above                    Optimal  130-159  mg/dL   Borderline  425-956  mg/dL   High  >387  mg/dL   Very High Performed at [facility], [address], [place], [place] [address]    Magnesium  10/19/2023 2.2  1.7 - 2.4 mg/dL Final   Performed at [facility], [address], [place], [place] [address]  Admission on 05/19/2023, Discharged on 05/19/2023  Component Date Value Ref Range Status   Glucose-Capillary 05/19/2023 76  70 - 99 mg/dL Final   Glucose reference range applies only to samples taken after fasting for at least 8 hours.   Admission on 05/19/2023, Discharged on 05/19/2023  Component Date Value Ref Range Status   WBC 05/19/2023 5.8  4.0 - 10.5 K/uL Final   RBC 05/19/2023 4.61  4.22 - 5.81 MIL/uL Final   Hemoglobin 05/19/2023 12.3 (L)  13.0 - 17.0 g/dL Final   HCT 47/82/9562 40.0  39.0 - 52.0 % Final   MCV 05/19/2023 86.8  80.0 - 100.0 fL Final   MCH 05/19/2023 26.7  26.0 - 34.0 pg Final   MCHC 05/19/2023 30.8  30.0 - 36.0 g/dL Final   RDW 13/01/6577 12.9  11.5 - 15.5 % Final   Platelets 05/19/2023 225  150 - 400 K/uL Final   nRBC 05/19/2023 0.0  0.0 - 0.2 % Final   Neutrophils Relative % 05/19/2023 57  % Final   Neutro Abs 05/19/2023 3.3  1.7 - 7.7 K/uL Final   Lymphocytes Relative 05/19/2023 26  % Final   Lymphs Abs 05/19/2023 1.5  0.7 - 4.0 K/uL Final   Monocytes Relative 05/19/2023 11  % Final   Monocytes Absolute 05/19/2023 0.6  0.1 - 1.0 K/uL Final   Eosinophils Relative 05/19/2023 5  % Final   Eosinophils Absolute 05/19/2023 0.3  0.0 - 0.5 K/uL Final   Basophils Relative 05/19/2023 1  % Final   Basophils Absolute 05/19/2023 0.1  0.0 - 0.1 K/uL Final   Immature Granulocytes 05/19/2023 0  % Final   Abs Immature Granulocytes 05/19/2023 0.02  0.00 - 0.07 K/uL Final   Performed at [facility], [address], [place], [place] [address]   Sodium 05/19/2023 139  135 - 145 mmol/L Final   Potassium 05/19/2023 3.8  3.5 - 5.1 mmol/L Final   Chloride 05/19/2023 105  98 - 111 mmol/L Final   CO2 05/19/2023 28  22 - 32 mmol/L Final   Glucose, Bld 05/19/2023 86  70 - 99 mg/dL Final   Glucose reference range applies only to samples taken after fasting for at least 8 hours.   BUN 05/19/2023 13  6 - 20 mg/dL Final   Creatinine, Ser 05/19/2023 0.97  0.61 - 1.24 mg/dL Final   Calcium 95/28/4132 9.1  8.9 - 10.3 mg/dL Final   GFR, Estimated 05/19/2023 >60  >60 mL/min Final   Comment: (NOTE) Calculated using the CKD-EPI Creatinine Equation (2021)    Anion gap 05/19/2023 6  5 - 15 Final   Performed at [facility], [address] [place], [place] [address]   Troponin I (High Sensitivity) 05/19/2023 20 (H)  <18 ng/L Final   Comment: (NOTE) Elevated high sensitivity troponin I (hsTnI) values and significant  changes across serial measurements may suggest ACS but many other  chronic and acute conditions are known to elevate hsTnI results.  Refer to the "Links" section for chest pain algorithms and additional  guidance. Performed at [facility], [address], [place], [place] [address]    B Natriuretic Peptide 05/19/2023 9.5  0.0 - 100.0 pg/mL  Final   Performed at [facility], [address], [place], [place] [address]   Troponin I (High Sensitivity) 05/19/2023 18 (H)  <18 ng/L Final   Comment: (NOTE) Elevated high sensitivity troponin I (hsTnI) values and significant  changes across serial measurements may suggest ACS but many other  chronic and acute conditions are known to elevate hsTnI results.  Refer to the "Links" section for chest pain algorithms and additional  guidance. Performed at [facility], [address], [place], [place] [address]   Admission on 05/17/2023, Discharged on 05/17/2023  Component Date Value Ref Range Status   Sodium 05/17/2023 137  135 - 145 mmol/L Final   Potassium 05/17/2023 3.5  3.5 - 5.1 mmol/L Final   Chloride 05/17/2023 105  98 - 111 mmol/L Final   CO2 05/17/2023 26  22 - 32 mmol/L Final   Glucose, Bld 05/17/2023 85  70 - 99 mg/dL Final   Glucose reference range applies only to samples taken after fasting for at least 8 hours.   BUN 05/17/2023 15  6 - 20 mg/dL Final   Creatinine, Ser 05/17/2023 1.29 (H)  0.61 - 1.24 mg/dL Final   Calcium 65/78/4696 8.7 (L)  8.9 - 10.3 mg/dL Final   Total Protein 29/52/8413 5.6 (L)  6.5 - 8.1 g/dL Final   Albumin 24/40/1027 3.1 (L)  3.5 - 5.0 g/dL Final   AST 25/36/6440 29  15 - 41 U/L Final   ALT 05/17/2023 27  0 - 44 U/L Final   Alkaline Phosphatase 05/17/2023 61  38 - 126 U/L Final    Total Bilirubin 05/17/2023 0.4  <1.2 mg/dL Final   GFR, Estimated 05/17/2023 >60  >60 mL/min Final   Comment: (NOTE) Calculated using the CKD-EPI Creatinine Equation (2021)    Anion gap 05/17/2023 6  5 - 15 Final   Performed at [facility], [address] [place], [place] [address]   Alcohol, Ethyl (B) 05/17/2023 <10  <10 mg/dL Final   Comment: (NOTE) Lowest detectable limit for serum alcohol is 10 mg/dL.  For medical purposes only. Performed at [facility], [address], [place], [place] [address]    WBC 05/17/2023 6.2  4.0 - 10.5 K/uL Final   RBC 05/17/2023 4.46  4.22 - 5.81 MIL/uL Final   Hemoglobin 05/17/2023 12.2 (L)  13.0 - 17.0 g/dL Final   HCT 87/56/4332 39.6  39.0 - 52.0 % Final   MCV 05/17/2023 88.8  80.0 - 100.0 fL Final   MCH 05/17/2023 27.4  26.0 - 34.0 pg Final   MCHC 05/17/2023 30.8  30.0 - 36.0 g/dL Final   RDW 95/18/8416 12.9  11.5 - 15.5 % Final   Platelets 05/17/2023 263  150 - 400 K/uL Final   nRBC 05/17/2023 0.0  0.0 - 0.2 % Final   Performed at [facility], [address], [place], [place] [address]  Admission on 05/15/2023, Discharged on 05/16/2023  Component Date Value Ref Range Status   WBC 05/15/2023 7.5  4.0 - 10.5 K/uL Final   RBC 05/15/2023 4.41  4.22 - 5.81 MIL/uL Final   Hemoglobin 05/15/2023 12.3 (L)  13.0 - 17.0 g/dL Final   HCT 16/07/930 39.2  39.0 - 52.0 % Final   MCV 05/15/2023 88.9  80.0 - 100.0 fL Final   MCH 05/15/2023 27.9  26.0 - 34.0 pg Final   MCHC 05/15/2023 31.4  30.0 - 36.0 g/dL Final   RDW 35/57/3220 12.8  11.5 - 15.5 % Final   Platelets 05/15/2023 273  150 - 400 K/uL Final   nRBC 05/15/2023 0.0  0.0 - 0.2 % Final   Neutrophils Relative % 05/15/2023 68  % Final   Neutro Abs 05/15/2023 5.1  1.7 - 7.7 K/uL Final   Lymphocytes Relative 05/15/2023 20  % Final   Lymphs Abs 05/15/2023 1.5  0.7 - 4.0 K/uL Final   Monocytes Relative 05/15/2023 9  % Final   Monocytes Absolute 05/15/2023 0.7  0.1 - 1.0 K/uL Final    Eosinophils Relative 05/15/2023 2  % Final   Eosinophils Absolute 05/15/2023 0.1  0.0 - 0.5 K/uL Final   Basophils Relative 05/15/2023 1  % Final   Basophils Absolute 05/15/2023 0.0  0.0 - 0.1 K/uL Final   Immature Granulocytes 05/15/2023 0  % Final   Abs Immature Granulocytes 05/15/2023 0.03  0.00 - 0.07 K/uL Final   Performed at [facility], [address] [place], [place] [address]   Alcohol, Ethyl (B) 05/15/2023 <10  <10 mg/dL Final   Comment: (NOTE) Lowest detectable limit for serum alcohol is 10 mg/dL.  For medical purposes only. Performed at [facility], [address], [place], [place] [address]    Sodium 05/15/2023 137  135 - 145 mmol/L Final   Potassium 05/15/2023 3.5  3.5 - 5.1 mmol/L Final   Chloride 05/15/2023 102  98 - 111 mmol/L Final   CO2 05/15/2023 27  22 - 32 mmol/L Final   Glucose, Bld 05/15/2023 89  70 - 99 mg/dL Final   Glucose reference range applies only to samples taken after fasting for at least 8 hours.   BUN 05/15/2023 8  6 - 20 mg/dL Final   Creatinine, Ser 05/15/2023 0.95  0.61 - 1.24 mg/dL Final   Calcium 09/81/1914 9.1  8.9 - 10.3 mg/dL Final   GFR, Estimated 05/15/2023 >60  >60 mL/min Final   Comment: (NOTE) Calculated using the CKD-EPI Creatinine Equation (2021)    Anion gap 05/15/2023 8  5 - 15 Final   Performed at [facility], [address], [place], [place] [address]   Acetaminophen  (Tylenol ), Serum 05/15/2023 <10 (L)  10 - 30 ug/mL Final   Comment: (NOTE) Therapeutic concentrations vary significantly. A range of 10-30 ug/mL  may be an effective concentration for many patients. However, some  are best treated at concentrations outside of this range. Acetaminophen  concentrations >150 ug/mL at 4 hours after ingestion  and >50 ug/mL at 12 hours after ingestion are often associated with  toxic reactions.  Performed at [facility], [address] [place], [place] [address]    Salicylate Lvl 05/15/2023 <7.0 (L)  7.0 - 30.0 mg/dL Final   Performed at [facility], [address] [place], [place] [address]  Admission on 05/13/2023, Discharged on 05/14/2023  Component Date Value Ref Range Status   WBC 05/13/2023 6.8  4.0 - 10.5 K/uL Final   RBC 05/13/2023 4.59  4.22 - 5.81 MIL/uL Final   Hemoglobin 05/13/2023 12.5 (L)  13.0 - 17.0 g/dL Final   HCT 46/96/2952 40.1  39.0 - 52.0 % Final   MCV 05/13/2023 87.4  80.0 - 100.0 fL Final   MCH 05/13/2023 27.2  26.0 - 34.0 pg Final   MCHC 05/13/2023 31.2  30.0 - 36.0 g/dL Final   RDW 84/13/2440 12.9  11.5 - 15.5 % Final   Platelets 05/13/2023 308  150 - 400 K/uL Final  nRBC 05/13/2023 0.0  0.0 - 0.2 % Final   Performed at [facility], [address] [place], [place] [address]   Sodium 05/13/2023 139  135 - 145 mmol/L Final   Potassium 05/13/2023 3.8  3.5 - 5.1 mmol/L Final   Chloride 05/13/2023 105  98 - 111 mmol/L Final   CO2 05/13/2023 24  22 - 32 mmol/L Final   Glucose, Bld 05/13/2023 108 (H)  70 - 99 mg/dL Final   Glucose reference range applies only to samples taken after fasting for at least 8 hours.   BUN 05/13/2023 17  6 - 20 mg/dL Final   Creatinine, Ser 05/13/2023 1.02  0.61 - 1.24 mg/dL Final   Calcium 62/13/0865 9.2  8.9 - 10.3 mg/dL Final   GFR, Estimated 05/13/2023 >60  >60 mL/min Final   Comment: (NOTE) Calculated using the CKD-EPI Creatinine Equation (2021)    Anion gap 05/13/2023 10  5 - 15 Final   Performed at [facility], [address] [place], [place] [address]  Admission on 05/12/2023, Discharged on 05/13/2023  Component Date Value Ref Range Status   SARS Coronavirus 2 by RT PCR 05/13/2023 NEGATIVE  NEGATIVE Final   Comment: (NOTE) SARS-CoV-2 target nucleic acids are NOT DETECTED.  The SARS-CoV-2 RNA is generally detectable in upper respiratory specimens during the acute phase of infection. The lowest concentration of  SARS-CoV-2 viral copies this assay can detect is 138 copies/mL. A negative result does not preclude SARS-Cov-2 infection and should not be used as the sole basis for treatment or other patient management decisions. A negative result may occur with  improper specimen collection/handling, submission of specimen other than nasopharyngeal swab, presence of viral mutation(s) within the areas targeted by this assay, and inadequate number of viral copies(<138 copies/mL). A negative result must be combined with clinical observations, patient history, and epidemiological information. The expected result is Negative.  Fact Sheet for Patients:  BloggerCourse.com  Fact Sheet for Healthcare Providers:  SeriousBroker.it  This test is no                          t yet approved or cleared by the [place]  FDA and  has been authorized for detection and/or diagnosis of SARS-CoV-2 by FDA under an Emergency Use Authorization (EUA). This EUA will remain  in effect (meaning this test can be used) for the duration of the COVID-19 declaration under Section 564(b)(1) of the Act, 21 U.S.C.section 360bbb-3(b)(1), unless the authorization is terminated  or revoked sooner.       Influenza A by PCR 05/13/2023 NEGATIVE  NEGATIVE Final   Influenza B by PCR 05/13/2023 NEGATIVE  NEGATIVE Final   Comment: (NOTE) The Xpert Xpress SARS-CoV-2/FLU/RSV plus assay is intended as an aid in the diagnosis of influenza from Nasopharyngeal swab specimens and should not be used as a sole basis for treatment. Nasal washings and aspirates are unacceptable for Xpert Xpress SARS-CoV-2/FLU/RSV testing.  Fact Sheet for Patients: BloggerCourse.com  Fact Sheet for Healthcare Providers: SeriousBroker.it  This test is not yet approved or cleared by the [place]  FDA and has been authorized for detection and/or diagnosis of  SARS-CoV-2 by FDA under an Emergency Use Authorization (EUA). This EUA will remain in effect (meaning this test can be used) for the duration of the COVID-19 declaration under Section 564(b)(1) of the Act, 21 U.S.C. section 360bbb-3(b)(1), unless the authorization is terminated or revoked.     Resp Syncytial  Virus by PCR 05/13/2023 NEGATIVE  NEGATIVE Final   Comment: (NOTE) Fact Sheet for Patients: BloggerCourse.com  Fact Sheet for Healthcare Providers: SeriousBroker.it  This test is not yet approved or cleared by the [place]  FDA and has been authorized for detection and/or diagnosis of SARS-CoV-2 by FDA under an Emergency Use Authorization (EUA). This EUA will remain in effect (meaning this test can be used) for the duration of the COVID-19 declaration under Section 564(b)(1) of the Act, 21 U.S.C. section 360bbb-3(b)(1), unless the authorization is terminated or revoked.  Performed at [facility], [address] [place], [place] [address]   Admission on 05/04/2023, Discharged on 05/05/2023  Component Date Value Ref Range Status   Sodium 05/04/2023 136  135 - 145 mmol/L Final   Potassium 05/04/2023 3.7  3.5 - 5.1 mmol/L Final   Chloride 05/04/2023 98  98 - 111 mmol/L Final   CO2 05/04/2023 28  22 - 32 mmol/L Final   Glucose, Bld 05/04/2023 110 (H)  70 - 99 mg/dL Final   Glucose reference range applies only to samples taken after fasting for at least 8 hours.   BUN 05/04/2023 18  6 - 20 mg/dL Final   Creatinine, Ser 05/04/2023 1.11  0.61 - 1.24 mg/dL Final   Calcium 60/45/4098 9.5  8.9 - 10.3 mg/dL Final   Total Protein 11/91/4782 6.1 (L)  6.5 - 8.1 g/dL Final   Albumin 95/62/1308 3.7  3.5 - 5.0 g/dL Final   AST 65/78/4696 59 (H)  15 - 41 U/L Final   ALT 05/04/2023 44  0 - 44 U/L Final   Alkaline Phosphatase 05/04/2023 55  38 - 126 U/L Final   Total Bilirubin 05/04/2023 0.6  0.3 - 1.2 mg/dL Final    GFR, Estimated 05/04/2023 >60  >60 mL/min Final   Comment: (NOTE) Calculated using the CKD-EPI Creatinine Equation (2021)    Anion gap 05/04/2023 10  5 - 15 Final   Performed at [facility], [address] [place], [place] [address]   Alcohol, Ethyl (B) 05/04/2023 <10  <10 mg/dL Final   Comment: (NOTE) Lowest detectable limit for serum alcohol is 10 mg/dL.  For medical purposes only. Performed at [facility], [address] [place], [place] [address]    WBC 05/04/2023 7.8  4.0 - 10.5 K/uL Final   RBC 05/04/2023 4.61  4.22 - 5.81 MIL/uL Final   Hemoglobin 05/04/2023 12.6 (L)  13.0 - 17.0 g/dL Final   HCT 40/04/2724 40.7  39.0 - 52.0 % Final   MCV 05/04/2023 88.3  80.0 - 100.0 fL Final   MCH 05/04/2023 27.3  26.0 - 34.0 pg Final   MCHC 05/04/2023 31.0  30.0 - 36.0 g/dL Final   RDW 36/64/4034 12.6  11.5 - 15.5 % Final   Platelets 05/04/2023 226  150 - 400 K/uL Final   nRBC 05/04/2023 0.0  0.0 - 0.2 % Final   Neutrophils Relative % 05/04/2023 65  % Final   Neutro Abs 05/04/2023 5.1  1.7 - 7.7 K/uL Final   Lymphocytes Relative 05/04/2023 19  % Final   Lymphs Abs 05/04/2023 1.5  0.7 - 4.0 K/uL Final   Monocytes Relative 05/04/2023 8  % Final   Monocytes Absolute 05/04/2023 0.6  0.1 - 1.0 K/uL Final   Eosinophils Relative 05/04/2023 6  % Final   Eosinophils Absolute 05/04/2023 0.5  0.0 - 0.5 K/uL Final   Basophils Relative 05/04/2023 1  % Final   Basophils  Absolute 05/04/2023 0.1  0.0 - 0.1 K/uL Final   Immature Granulocytes 05/04/2023 1  % Final   Abs Immature Granulocytes 05/04/2023 0.04  0.00 - 0.07 K/uL Final   Performed at [facility], [address] [place], [place] [address]  Admission on 05/03/2023, Discharged on 05/04/2023  Component Date Value Ref Range Status   SARS Coronavirus 2 by RT PCR 05/03/2023 NEGATIVE  NEGATIVE Final   Performed at [facility], [address], [place], [place] [address]   Admission on 04/29/2023, Discharged on 04/29/2023  Component Date Value Ref Range Status   Sodium 04/29/2023 140  135 - 145 mmol/L Final   Potassium 04/29/2023 3.4 (L)  3.5 - 5.1 mmol/L Final   Chloride 04/29/2023 101  98 - 111 mmol/L Final   CO2 04/29/2023 28  22 - 32 mmol/L Final   Glucose, Bld 04/29/2023 88  70 - 99 mg/dL Final   Glucose reference range applies only to samples taken after fasting for at least 8 hours.   BUN 04/29/2023 20  6 - 20 mg/dL Final   Creatinine, Ser 04/29/2023 1.09  0.61 - 1.24 mg/dL Final   Calcium 09/81/1914 9.5  8.9 - 10.3 mg/dL Final   GFR, Estimated 04/29/2023 >60  >60 mL/min Final   Comment: (NOTE) Calculated using the CKD-EPI Creatinine Equation (2021)    Anion gap 04/29/2023 11  5 - 15 Final   Performed at [facility], [address] [place], [place] [address]   WBC 04/29/2023 9.9  4.0 - 10.5 K/uL Final   RBC 04/29/2023 4.99  4.22 - 5.81 MIL/uL Final   Hemoglobin 04/29/2023 13.7  13.0 - 17.0 g/dL Final   HCT 62/13/0865 42.9  39.0 - 52.0 % Final   MCV 04/29/2023 86.0  80.0 - 100.0 fL Final   MCH 04/29/2023 27.5  26.0 - 34.0 pg Final   MCHC 04/29/2023 31.9  30.0 - 36.0 g/dL Final   RDW 78/46/9629 12.6  11.5 - 15.5 % Final   Platelets 04/29/2023 219  150 - 400 K/uL Final   nRBC 04/29/2023 0.0  0.0 - 0.2 % Final   Performed at [facility], [address] [place], [place] [address]  There may be more visits with results that are not included.    Allergies: Patient has no known allergies.  Medications:  Facility Ordered Medications  Medication   acetaminophen  (TYLENOL ) tablet 650 mg   alum & mag hydroxide-simeth (MAALOX/MYLANTA) 200-200-20 MG/5ML suspension 30 mL   magnesium  hydroxide (MILK OF MAGNESIA) suspension 30 mL   haloperidol  (HALDOL ) tablet 5 mg   And   diphenhydrAMINE  (BENADRYL ) capsule 50 mg   haloperidol  lactate (HALDOL ) injection 5 mg   And   diphenhydrAMINE  (BENADRYL ) injection 50 mg    And   LORazepam  (ATIVAN ) injection 2 mg   haloperidol  lactate (HALDOL ) injection 10 mg   And   diphenhydrAMINE  (BENADRYL ) injection 50 mg   And   LORazepam  (ATIVAN ) injection 2 mg   traZODone  (DESYREL ) tablet 50 mg   hydrOXYzine  (ATARAX ) tablet 25 mg   haloperidol  (HALDOL ) tablet 5 mg   [COMPLETED] LORazepam  (ATIVAN ) tablet 2 mg   mirtazapine  (REMERON ) tablet 15 mg   PTA Medications  Medication Sig   TYLENOL  500 MG tablet Take 500-1,000 mg by mouth every 6 (six) hours as needed for mild pain (pain score 1-3) or headache.   naproxen  (NAPROSYN ) 500 MG tablet Take 1 tablet (500 mg total) by mouth 2 (  two) times daily.      Medical Decision Making  Aaron Rubio, 42 y.o., male patient seen face to face by this Nurse Practitioner and chart reviewed on 10/20/23.  On evaluation Aaron Rubio presents with rambling speech, is very labile and appears to have some thought blocking.  Patient does not answer questions directly and was hyper focused on this writer during the assessment. When asked about his upbringing and where he lived he started questioning this provider about where she lives and wanted to know if I was from this country. Patient made bizarre statement about Trump. Bush and JFK. Patient stated that he saw shadows and the shadows were putting spiders on his arms and then pointed to his arms.  Patient states that he has not been sleeping, eating and that he does not have any family.    Recommendations  Based on my evaluation the patient does not appear to have an emergency medical condition. Patient recommended for inpatient treatment, but will be admitted to [facility] continuous observation for crisis management, safety and stabilization until an inpatient bed can be identified.   Vijay Durflinger E Kristyne Woodring, NP 10/20/23  5:42 AM

## 2023-10-19 NOTE — Progress Notes (Signed)
   10/19/23 1834  [facility] Triage Screening (Walk-ins at [facility] only)  How Did You Hear About Us ? Self  What Is the Reason for Your Visit/Call Today? Aaron Rubio is a 43 year old male presenting to [facility] unaccompanied. Pt reports he is hallucinating at this time. PT states "I am seeing spiders all over myself". Pt denies substance use in the past 24 hours. Pt endorses SI and HI currently. Pt also is seeing things and hearing voices. Pt is looking for inpatient.  How Long Has This Been Causing You Problems? > than 6 months  Have You Recently Had Any Thoughts About Hurting Yourself? Yes  How long ago did you have thoughts about hurting yourself? today  Are You Planning to Commit Suicide/Harm Yourself At This time? Yes  Have you Recently Had Thoughts About Hurting Someone [name]? Yes  How long ago did you have thoughts of harming others? today  Are You Planning To Harm Someone At This Time? No  Physical Abuse Denies  Verbal Abuse Denies  Sexual Abuse Denies  Exploitation of patient/patient's resources Denies  Self-Neglect Denies  Possible abuse reported to: Other (Comment)  Are you currently experiencing any auditory, visual or other hallucinations? No  Have You Used Any Alcohol or Drugs in the Past 24 Hours? No  Do you have any current medical co-morbidities that require immediate attention? No  What Do You Feel Would Help You the Most Today? Treatment for Depression or other mood problem  If access to [facility] was not available, would you have sought care in the Emergency Department? No  Determination of Need Urgent (48 hours)  Options For Referral Inpatient Hospitalization  Determination of Need filed? Yes

## 2023-10-19 NOTE — BH Assessment (Addendum)
 Comprehensive Clinical Assessment (CCA) Note  10/19/2023 Aaron Rubio [id number]  Disposition: Aaron Olp, NP recommends inpatient treatment. CSW to seek placement.   The patient demonstrates the following risk factors for suicide: Chronic risk factors for suicide include: psychiatric disorder of Psychosis, unspecified . Acute risk factors for suicide include:  Pt did not answer SI questions during the assessment . Protective factors for this patient include:  UTA . Considering these factors, the overall suicide risk at this point appears to be high. Patient is not appropriate for outpatient follow up.  Aaron Rubio is a 43 year old male who presents voluntary and unaccompanied to [facility] ([facility]). Pt reports, it's been a Therapist, Music of a day, design by certain people against people. Pt reports, he's in a crisis, shadows putting spiders on him, don't know if kids listen to Democracy; Bush, JFK, Trump. Pt reports, people are trying to put ointments on my mouth, there's a canister with stool in it. 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 Clinician asked the pt if he was suicidal, pt replied, Witchery...have you ever seen the [facility], being hunted by someone. Pt reports, he was [employer] as a kid. Clinician asked the pt if he wants to hurt someone, pt replied, NP (while the NP was present.) Pt was focused on the Nurse Practitioner (NP), her role, what she does, the pt asked questions about time. Pt asked the NP random questions such as where are you from, are you from this country, do you know the DEA in [place] and [place] , as responses. Pt reports, he [place] on the [address], from [place] to [place] . Pt did not answer questions related to SI, self-injurious behaviors and access to weapons .   Pt's UDS is pending. Pt reports, he's not taking his medications but was unable to provide  information of what he was taking how long he's been off he's medications. Pt has previous inpatient admissions.   During the assessment, the pt was poor historian he did not answer many questions. Pt presents disorganized, thought blocking, fair eye contact and slow speech. Pt's mood was labile. Pt's affect was blunted. Pt's insight and judgement are poor.   Chief Complaint:  Chief Complaint  Patient presents with   Hallucinations   Homicidal   Suicidal   Visit Diagnosis: Psychosis, unspecified.   CCA Screening, Triage and Referral (STR)  Patient Reported Information How did you hear about us ? Self  What Is the Reason for Your Visit/Call Today? Aaron Rubio is a 43 year old male presenting to [facility] unaccompanied. Pt reports he is hallucinating at this time. PT states I am seeing spiders all over myself. Pt denies substance use in the past 24 hours. Pt endorses SI and HI currently. Pt also is seeing things and hearing voices. Pt is looking for inpatient.  How Long Has This Been Causing You Problems? > than 6 months  What Do You Feel Would Help You the Most Today? Treatment for Depression or other mood problem   Have You Recently Had Any Thoughts About Hurting Yourself? Yes  Are You Planning to Commit Suicide/Harm Yourself At This time? Yes   Flowsheet Row ED from 10/19/2023 in [facility] Most recent reading at 10/19/2023 10:22 PM ED from 05/20/2023 in [facility] Emergency Department at [facility] Most recent reading at 05/20/2023  7:07 PM ED from 05/20/2023 in [facility] Most recent reading at 05/20/2023  2:40 PM  C-SSRS RISK CATEGORY High Risk No Risk No Risk       Have you Recently Had Thoughts About Hurting Someone [name]? Yes  Are You Planning to Harm Someone at This Time? No  Explanation: Pt reports, the NP.   Have You Used Any Alcohol or Drugs in the Past 24 Hours? No  How Long Ago Did You Use Drugs or  Alcohol? NA What Did You Use and How Much? cocaine use   Do You Currently Have a Therapist/Psychiatrist? No  Name of Therapist/Psychiatrist:    Have You Been Recently Discharged From Any Office Practice or Programs? No  Explanation of Discharge From Practice/Program: UTA    CCA Screening Triage Referral Assessment Type of Contact: Face-to-Face  Telemedicine Service Delivery:   Is this Initial or Reassessment?   Date Telepsych consult ordered in CHL:    Time Telepsych consult ordered in CHL:    Location of Assessment: [facility] Services  Provider Location: [facility] Assessment Services   Collateral Involvement: None.   Does Patient Have a Automotive Engineer Guardian? No  Legal Guardian Contact Information: Pt is his own guardian.  Copy of Legal Guardianship Form: -- (Pt is his own guardian.)  Legal Guardian Notified of Arrival: -- (Pt is his own guardian.)  Legal Guardian Notified of Pending Discharge: -- (Pt is his own guardian.)  If Minor and Not Living with Parent(s), Who has Custody? Pt is an adult.  Is CPS involved or ever been involved? -- (UTA)  Is APS involved or ever been involved? -- (UTA)   Patient Determined To Be At Risk for Harm To Self or Others Based on Review of Patient Reported Information or Presenting Complaint? -- (UTA)  Method: -- (UTA)  Availability of Means: -- (UTA)  Intent: -- (UTA)  Notification Required: Identifiable person is aware (During the assessment pt was asked if he wanted to hurt anyone, pt reports, the NP (with the NP present.))  Additional Information for Danger to Others Potential: -- (Delusional.)  Additional Comments for Danger to Others Potential: Pt is disorganized.  Are There Guns or Other Weapons in Your Home? -- (UTA)  Types of Guns/Weapons: UTA  Are These Weapons Safely Secured?                            -- (UTA)  Who Could Verify You Are Able To Have These Secured: UTA  Do You Have any  Outstanding Charges, Pending Court Dates, Parole/Probation? UTA  Contacted To Inform of Risk of Harm To Self or Others: Other: Comment (UTA)    Does Patient Present under Involuntary Commitment? -- Aaron Rubio)    [place] of Residence: [place]   Patient Currently Receiving the Following Services: Not Receiving Services   Determination of Need: Urgent (48 hours)   Options For Referral: Inpatient Hospitalization     CCA Biopsychosocial Patient Reported Schizophrenia/Schizoaffective Diagnosis in Past: Yes   Strengths: Pt reports, he came in because he's in a crisis.   Mental Health Symptoms Depression:  Sleep (too much or little); Irritability; Difficulty Concentrating; Increase/decrease in appetite   Duration of Depressive symptoms: Duration of Depressive Symptoms: N/A   Mania:  Irritability; Racing thoughts   Anxiety:   Tension; Restlessness   Psychosis:  Delusions; Hallucinations   Duration of Psychotic symptoms: Duration of Psychotic Symptoms: N/A   Trauma:  -- (UTA)   Obsessions:  Poor insight   Compulsions:  Poor Insight   Inattention:  Disorganized   Hyperactivity/Impulsivity:  Feeling of restlessness   Oppositional/Defiant Behaviors:  Argumentative   Emotional Irregularity:  Potentially harmful impulsivity   Other Mood/Personality Symptoms:  UTA    Mental Status Exam Appearance and self-care  Stature:  Tall   Weight:  Average weight   Clothing:  Casual   Grooming:  Normal   Cosmetic use:  None   Posture/gait:  Slumped   Motor activity:  Restless   Sensorium  Attention:  Confused   Concentration:  Focuses on irrelevancies   Orientation:  Person; Place   Recall/memory:  Defective in Immediate   Affect and Mood  Affect:  Blunted   Mood:  Other (Comment) (Labile.)   Relating  Eye contact:  Staring (At times.)   Facial expression:  -- (Blunted.)   Attitude toward examiner:  Guarded; Irritable   Thought and Language  Speech  flow: Slow   Thought content:  Delusions   Preoccupation:  Other (Comment) (Delusions.)   Hallucinations:  Tactile   Organization:  Disorganized   Company Secretary of Knowledge:  Poor   Intelligence:  Average   Abstraction:  Concrete   Judgement:  Poor   Reality Testing:  Distorted   Insight:  Lacking   Decision Making:  Impulsive   Social Functioning  Social Maturity:  Impulsive   Social Judgement:  Chief Of Staff; Heedless   Stress  Stressors:  Other (Comment) (UTA)   Coping Ability:  Deficient supports   Skill Deficits:  Communication; Decision making; Interpersonal; Responsibility; Self-control; Self-care   Supports:  Support needed     Religion: Religion/Spirituality Are You A Religious Person?:  (UTA) How Might This Affect Treatment?: UTA  Leisure/Recreation: Leisure / Recreation Do You Have Hobbies?:  (UTA)  Exercise/Diet: Exercise/Diet Do You Exercise?:  (UTA) Have You Gained or Lost A Significant Amount of Weight in the Past Six Months?:  (UTA) Do You Follow a Special Diet?:  (UTA) Do You Have Any Trouble Sleeping?: Yes Explanation of Sleeping Difficulties: Pt reports, not getting much sleep.   CCA Employment/Education Employment/Work Situation: Employment / Work Situation Employment Situation:  INDUSTRIAL/PRODUCT DESIGNER) Patient's Job has Been Impacted by Current Illness:  (UTA) Has Patient ever Been in the [employer]?:  (UTA)  Education: Education Is Patient Currently Attending School?:  (UTA) Last Grade Completed:  (UTA) Did You Attend College?:  (UTA) Did You Have An Individualized Education Program (IIEP):  (UTA) Did You Have Any Difficulty At School?:  (UTA) Patient's Education Has Been Impacted by Current Illness:  (UTA)   CCA Family/Childhood History Family and Relationship History: Family history Marital status: Other (comment) (UTA) Does patient have children?:  (UTA)  Childhood History:  Childhood History By whom was/is the  patient raised?: Other (Comment) (UTA) Did patient suffer any verbal/emotional/physical/sexual abuse as a child?:  (UTA) Did patient suffer from severe childhood neglect?:  (UTA) Has patient ever been sexually abused/assaulted/raped as an adolescent or adult?:  (UTA) Was the patient ever a victim of a crime or a disaster?:  (UTA) Witnessed domestic violence?:  (UTA) Has patient been affected by domestic violence as an adult?:  (UTA)   CCA Substance Use Alcohol/Drug Use: Alcohol / Drug Use Pain Medications: See MAR Prescriptions: See MAR Over the Counter: See MAR History of alcohol / drug use?:  (UTA) Longest period of sobriety (when/how long): UTA Negative Consequences of Use:  (UTA) Withdrawal Symptoms:  (UTA)    ASAM's:  Six  Dimensions of Multidimensional Assessment  Dimension 1:  Acute Intoxication and/or Withdrawal Potential:      Dimension 2:  Biomedical Conditions and Complications:      Dimension 3:  Emotional, Behavioral, or Cognitive Conditions and Complications:     Dimension 4:  Readiness to Change:     Dimension 5:  Relapse, Continued use, or Continued Problem Potential:     Dimension 6:  Recovery/Living Environment:     ASAM Severity Score:    ASAM Recommended Level of Treatment:     Substance use Disorder (SUD)    Recommendations for Services/Supports/Treatments: Recommendations for Services/Supports/Treatments Recommendations For Services/Supports/Treatments: Inpatient Hospitalization  Disposition Recommendation per psychiatric provider: We recommend inpatient psychiatric hospitalization when medically cleared. Patient is under voluntary admission status at this time; please IVC if attempts to leave hospital.   DSM5 Diagnoses: Patient Active Problem List   Diagnosis Date Noted   Passive suicidal ideations 05/05/2023   Psychosis (HCC) 05/05/2023   Polysubstance abuse (HCC) 05/05/2023     Referrals to Alternative Service(s): Referred to Alternative  Service(s):   Place:   Date:   Time:    Referred to Alternative Service(s):   Place:   Date:   Time:    Referred to Alternative Service(s):   Place:   Date:   Time:    Referred to Alternative Service(s):   Place:   Date:   Time:     Jackson JONETTA Broach, [facility] Comprehensive Clinical Assessment (CCA) Screening, Triage and Referral Note  10/19/2023 Aaron Rubio [id number]  Chief Complaint:  Chief Complaint  Patient presents with   Hallucinations   Homicidal   Suicidal   Visit Diagnosis:   Patient Reported Information How did you hear about us ? Self  What Is the Reason for Your Visit/Call Today? Aaron Rubio is a 43 year old male presenting to [facility] unaccompanied. Pt reports he is hallucinating at this time. PT states I am seeing spiders all over myself. Pt denies substance use in the past 24 hours. Pt endorses SI and HI currently. Pt also is seeing things and hearing voices. Pt is looking for inpatient.  How Long Has This Been Causing You Problems? > than 6 months  What Do You Feel Would Help You the Most Today? Treatment for Depression or other mood problem   Have You Recently Had Any Thoughts About Hurting Yourself? Yes  Are You Planning to Commit Suicide/Harm Yourself At This time? Yes   Have you Recently Had Thoughts About Hurting Someone [name]? Yes  Are You Planning to Harm Someone at This Time? No  Explanation: Pt reports, the NP.   Have You Used Any Alcohol or Drugs in the Past 24 Hours? No  How Long Ago Did You Use Drugs or Alcohol? UTA What Did You Use and How Much? cocaine use   Do You Currently Have a Therapist/Psychiatrist? No  Name of Therapist/Psychiatrist: No.  Have You Been Recently Discharged From Any Office Practice or Programs? No  Explanation of Discharge From Practice/Program: NA   CCA Screening Triage Referral Assessment Type of Contact: Face-to-Face  Telemedicine Service Delivery:   Is this Initial or Reassessment?   Date Telepsych consult  ordered in CHL:    Time Telepsych consult ordered in CHL:    Location of Assessment: [facility] Assessment Services  Provider Location: [facility] Assessment Services    Collateral Involvement: None.   Does Patient Have a Automotive Engineer Guardian? No. Name and Contact of Legal Guardian: Pt is his own  guardian.  If Minor and Not Living with Parent(s), Who has Custody? Pt is an adult.  Is CPS involved or ever been involved? -- (UTA)  Is APS involved or ever been involved? -- (UTA)   Patient Determined To Be At Risk for Harm To Self or Others Based on Review of Patient Reported Information or Presenting Complaint? -- (UTA)  Method: -- (UTA)  Availability of Means: -- (UTA)  Intent: -- (UTA)  Notification Required: Identifiable person is aware (During the assessment pt was asked if he wanted to hurt anyone, pt reports, the NP (with the NP present.))  Additional Information for Danger to Others Potential: -- (Delusional.)  Additional Comments for Danger to Others Potential: Pt is disorganized.  Are There Guns or Other Weapons in Your Home? -- (UTA)  Types of Guns/Weapons: UTA  Are These Weapons Safely Secured?                            -- (UTA)  Who Could Verify You Are Able To Have These Secured: UTA  Do You Have any Outstanding Charges, Pending Court Dates, Parole/Probation? UTA  Contacted To Inform of Risk of Harm To Self or Others: Other: Comment (UTA)   Does Patient Present under Involuntary Commitment? -- Aaron Rubio)    [place] of Residence: [place]   Patient Currently Receiving the Following Services: Not Receiving Services   Determination of Need: Urgent (48 hours)   Options For Referral: Inpatient Hospitalization   Disposition Recommendation per psychiatric provider: We recommend inpatient psychiatric hospitalization when medically cleared. Patient is under voluntary admission status at this time; please IVC if attempts to leave hospital.  Jackson JONETTA Broach, [facility]   Jackson JONETTA Broach, MS, [facility], [facility] Triage Specialist [phone]

## 2023-10-20 ENCOUNTER — Encounter (HOSPITAL_COMMUNITY): Payer: Self-pay | Admitting: Emergency Medicine

## 2023-10-20 LAB — POCT URINE DRUG SCREEN - MANUAL ENTRY (I-SCREEN)
POC Amphetamine UR: NOT DETECTED
POC Buprenorphine (BUP): NOT DETECTED
POC Cocaine UR: NOT DETECTED
POC Marijuana UR: NOT DETECTED
POC Methadone UR: NOT DETECTED
POC Methamphetamine UR: NOT DETECTED
POC Morphine: NOT DETECTED
POC Oxazepam (BZO): NOT DETECTED
POC Oxycodone UR: NOT DETECTED
POC Secobarbital (BAR): NOT DETECTED

## 2023-10-20 LAB — SARS CORONAVIRUS 2 BY RT PCR: SARS Coronavirus 2 by RT PCR: NEGATIVE

## 2023-10-20 MED ORDER — OLANZAPINE 10 MG IM SOLR: 5.0000 mg | Freq: Three times a day (TID) | INTRAMUSCULAR | Status: DC | PRN

## 2023-10-20 MED ORDER — NAPROXEN 500 MG PO TABS: 500.0000 mg | ORAL_TABLET | Freq: Two times a day (BID) | ORAL | Status: DC | PRN

## 2023-10-20 MED ORDER — OLANZAPINE 10 MG IM SOLR: 10.0000 mg | Freq: Three times a day (TID) | INTRAMUSCULAR | Status: DC | PRN

## 2023-10-20 MED ORDER — QUETIAPINE FUMARATE 25 MG PO TABS
25.0000 mg | ORAL_TABLET | Freq: Two times a day (BID) | ORAL | Status: DC
  Administered 2023-10-20 – 2023-10-22 (×5): 25 mg via ORAL
  Filled 2023-10-20: qty 1
  Filled 2023-10-20: qty 14
  Filled 2023-10-20 (×4): qty 1

## 2023-10-20 MED ORDER — CYCLOBENZAPRINE HCL 10 MG PO TABS
5.0000 mg | ORAL_TABLET | Freq: Two times a day (BID) | ORAL | Status: DC | PRN
Start: 2023-10-20 — End: 2023-10-22
  Administered 2023-10-20: 5 mg via ORAL
  Filled 2023-10-20: qty 1

## 2023-10-20 MED ORDER — OLANZAPINE 5 MG PO TBDP
5.0000 mg | ORAL_TABLET | Freq: Three times a day (TID) | ORAL | Status: DC | PRN
  Administered 2023-10-20: 5 mg via ORAL
  Filled 2023-10-20: qty 1

## 2023-10-20 MED ORDER — BENZTROPINE MESYLATE 1 MG PO TABS
1.0000 mg | ORAL_TABLET | Freq: Two times a day (BID) | ORAL | Status: DC
Start: 2023-10-20 — End: 2023-10-21
  Administered 2023-10-20 (×2): 1 mg via ORAL
  Filled 2023-10-20 (×2): qty 1

## 2023-10-20 NOTE — ED Notes (Signed)
Pt resting quietly.  Breathing even and unlabored. In view of nursing station.  

## 2023-10-20 NOTE — ED Notes (Signed)
 Pt is in the restroom he is calm and cooperative

## 2023-10-20 NOTE — ED Notes (Signed)
Pt resting quietly.  Breathing even and unlabored. No distress noted.   

## 2023-10-20 NOTE — ED Notes (Signed)
 Patient resting quietly in bed with eyes closed. Respirations equal and unlabored, skin warm and dry, NAD. Routine safety checks conducted according to facility protocol. Will continue to monitor for safety.

## 2023-10-20 NOTE — ED Notes (Signed)
 Pt sitting up in bed eating dinner.  No distress noted.

## 2023-10-20 NOTE — Progress Notes (Signed)
 Patient has been denied by [facility] due to no appropriate beds available. Patient meets BH inpatient criteria per [name], NP. Patient has been faxed out to the following facilities:   [facility] [address] [place] [address] [phone]  [facility] [address] Johnella Naas [place] [address] [phone]  [facility] [address], [place] [address] [phone]  [facility] [address] [place], [place] [address] [phone]  [facility] Patient Pl[facility], [place] [phone]  [facility] [address] [place] [address] [phone]  [facility] [address] [place] [address] [phone]  [facility] EFAX [address], [place] [phone]  [facility] [address] [place] [address] [phone]  [facility] [address] [place] [address] [phone]  [facility] [address] [place], [place] [address] [phone]  [facility] [address] [place] [address] [phone]  [facility] [address], [place] [address] [phone]  [facility] [address] [place] [address] [phone]  [facility] [address] [place] [address] [phone]  [facility] [address], [place] [address] [phone]  [facility] [address] [place] [address] [phone]  [facility] Josephina Nicks [place] [address] [phone]  [facility] [address], [place] [address] [phone]   Phares Brasher, MSW, LCSW-A  11:15 AM 10/20/2023

## 2023-10-20 NOTE — ED Notes (Signed)
 Pt calm and cooperative pt has been assessed and observed sitting on recliner he denies SI/HI/AVH no pain or distress will continue to monitor for safety
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
## 2023-10-21 ENCOUNTER — Other Ambulatory Visit: Payer: Self-pay

## 2023-10-21 NOTE — ED Notes (Signed)
 Pt resting in bed, eating dinner. No s/sx of distress. No further concerns at this time.

## 2023-10-21 NOTE — ED Notes (Signed)
 Pt sleeping@this  time breathing even and unlabored will continue to monitor for safety

## 2023-10-21 NOTE — Progress Notes (Signed)
 Patient has been denied by [facility] due to no appropriate beds available. Patient meets BH inpatient criteria per Aaron Alpers, NP. Patient has been faxed out to the following facilities:   [facility] [address] [place] [address] [phone]  [facility] [address] Johnella Naas [place] [address] [phone]  [facility] [address] [place] [address] [phone]  [facility] [address] [place], [place] [address] [phone]  [facility] Patient Placement [facility], [place] [phone]  [facility] [address] [place] [address] [phone]  [facility] [address] [place] [address] [phone]  [facility] EFAX [address] [place] [phone]  [facility] [address], [name] [address] [phone]  [facility] [address] [place] [address] [phone]  [facility] [address] [place], [place] [address] [phone]  [facility] [address] [place] [address] [phone]  [facility] [address], [place] [address] [phone]  [facility] [address] [place] [address] [phone]  [facility] [address] [place] [address] [phone]  [facility] [address] [place] [address] [phone]  [facility] [address] [place] [address] [phone]  [facility] [address] [facility] Josephina Nicks [place] [address] [phone]  [facility] [address], [place] [address] [phone]   Phares Brasher, MSW, LCSW-A  10:10 AM 10/21/2023

## 2023-10-21 NOTE — ED Notes (Signed)
 Pt awake, eating lunch. Sitting in bed, no s/sx of distress. No further concerns.

## 2023-10-21 NOTE — ED Notes (Signed)
 Pt lying back in bed, resting. Resp even and unlabored. No s/sx of distress. No concerns at this time.

## 2023-10-21 NOTE — ED Provider Notes (Addendum)
 Behavioral Health Progress Note  Date and Time: 10/21/2023 9:11 AM Name: Aaron Rubio MRN:  [id number]  HPI: Aaron Rubio is a 43 y.o. AA male with a  prior mental health history of Schizophrenia who initially presented to this urgent care on 4/18 with complaints of tactile & visual hallucinations. Noted to disorganized on admission, & recommended for inpatient behavioral health treatment and stabilization of his mental status.  Pt was restarted on his home medications by admitting provider: Haldol  5 mg twice daily, Remeron  15 mg nightly and Cogentin  1 mg twice daily. On 4/19, pt refused Haldol  stating that this medication had been discontinued, and it was discontinued and Seroquel  25 mg BID for mood stabilization started, which was confirmed on chart review by provider.  Today's patient assessment: On assessment today, pt is lying in bed with no signs of distress, he is calm, but answering questions at times in a non linear manner, presents with delusions of persecution, is circumstantial, presents with visual & tactile hallucinations; talks about someone he met yesterday putting "spiders all over me because he was making an approval of something." He states "he did not make himself seen, but I seen the spiders on me. He did that to harm me." He talks about hearing voices prior to arrival to this facility, but states that he has not seen any since being here. He talks about being in [place]  at another mental heaLth facility, and that when he left from there, he took the [place] bus to [place], and from [place], he took a train to [place] because he "needed to do something at the social services here." Pt shares that he does not have any family members in this area.  Pt reports a good appetite, reports a poor sleep quality, states that he has not been taking medications since leaving facility in [facility], but states that his medications are Seroquel , Remeron  & Tegretol. Writer  educated patient on the fact that he has been recommended for inpatient behavioral heALth hospitalization for treatment and stabilization of his mental status, and he verbalizes understanding and is agreeable to go on a voluntary basis. Pt denies SI/HI/AH at this time. He is presenting with paranoia & delusions of persecution-that person who put spiders on him is out to harm him.  Continuing to recommend inpatient behavioral health hospitalization for treatment and stabilization of mental status. Continuing current medication regimen while awaiting an inpatient bed.  Diagnosis:  Final diagnoses:  Psychosis, unspecified psychosis type (HCC)  Homelessness   Total Time spent with patient: 45 minutes  Past Psychiatric History: Schizophrenia  Past Medical History: Denies  Family History: not provided Family Psychiatric  History: not provided Social History: Denies substance use, homeless.  Additional Social History:    Pain Medications: See MAR Prescriptions: See MAR Over the Counter: See MAR History of alcohol / drug use?:  (UTA) Longest period of sobriety (when/how long): UTA Negative Consequences of Use:  (UTA) Withdrawal Symptoms:  (UTA)   Sleep: Poor  Appetite:  Fair  Current Medications:  Current Facility-Administered Medications  Medication Dose Route Frequency Provider Last Rate Last Admin   alum & mag hydroxide-simeth (MAALOX/MYLANTA) 200-200-20 MG/5ML suspension 30 mL  30 mL Oral Q4H PRN Bobbitt, Shalon E, NP       cyclobenzaprine  (FLEXERIL ) tablet 5 mg  5 mg Oral BID PRN Brent, Amanda C, NP   5 mg at 10/20/23 6962   hydrOXYzine  (ATARAX ) tablet 25 mg  25 mg Oral TID PRN Bobbitt, Shalon  E, NP   25 mg at 10/20/23 3086   magnesium  hydroxide (MILK OF MAGNESIA) suspension 30 mL  30 mL Oral Daily PRN Bobbitt, Shalon E, NP       mirtazapine  (REMERON ) tablet 15 mg  15 mg Oral QHS Bobbitt, Shalon E, NP   15 mg at 10/20/23 2113   naproxen  (NAPROSYN ) tablet 500 mg  500 mg Oral BID  PRN Brent, Amanda C, NP       OLANZapine  (ZYPREXA ) injection 10 mg  10 mg Intramuscular TID PRN Brent, Amanda C, NP       OLANZapine  (ZYPREXA ) injection 5 mg  5 mg Intramuscular TID PRN Brent, Amanda C, NP       OLANZapine  zydis (ZYPREXA ) disintegrating tablet 5 mg  5 mg Oral TID PRN Brent, Amanda C, NP   5 mg at 10/20/23 2113   QUEtiapine  (SEROQUEL ) tablet 25 mg  25 mg Oral BID Brent, Amanda C, NP   25 mg at 10/20/23 2113   Current Outpatient Medications  Medication Sig Dispense Refill   mirtazapine  (REMERON ) 7.5 MG tablet Take 7.5 mg by mouth at bedtime.     QUEtiapine  Fumarate (SEROQUEL  PO) Take by mouth 2 (two) times daily. Patient unable to specify a dose      Labs  Lab Results:  Admission on 10/19/2023  Component Date Value Ref Range Status   WBC 10/19/2023 10.8 (H)  4.0 - 10.5 K/uL Final   RBC 10/19/2023 5.59  4.22 - 5.81 MIL/uL Final   Hemoglobin 10/19/2023 14.6  13.0 - 17.0 g/dL Final   HCT 57/84/6962 46.1  39.0 - 52.0 % Final   MCV 10/19/2023 82.5  80.0 - 100.0 fL Final   MCH 10/19/2023 26.1  26.0 - 34.0 pg Final   MCHC 10/19/2023 31.7  30.0 - 36.0 g/dL Final   RDW 95/28/4132 13.2  11.5 - 15.5 % Final   Platelets 10/19/2023 275  150 - 400 K/uL Final   nRBC 10/19/2023 0.0  0.0 - 0.2 % Final   Neutrophils Relative % 10/19/2023 62  % Final   Neutro Abs 10/19/2023 6.8  1.7 - 7.7 K/uL Final   Lymphocytes Relative 10/19/2023 21  % Final   Lymphs Abs 10/19/2023 2.3  0.7 - 4.0 K/uL Final   Monocytes Relative 10/19/2023 10  % Final   Monocytes Absolute 10/19/2023 1.0  0.1 - 1.0 K/uL Final   Eosinophils Relative 10/19/2023 5  % Final   Eosinophils Absolute 10/19/2023 0.6 (H)  0.0 - 0.5 K/uL Final   Basophils Relative 10/19/2023 1  % Final   Basophils Absolute 10/19/2023 0.1  0.0 - 0.1 K/uL Final   Immature Granulocytes 10/19/2023 1  % Final   Abs Immature Granulocytes 10/19/2023 0.10 (H)  0.00 - 0.07 K/uL Final   Performed at [facility], [address] [place],  [place] [address]   Sodium 10/19/2023 140  135 - 145 mmol/L Final   Potassium 10/19/2023 4.8  3.5 - 5.1 mmol/L Final   Chloride 10/19/2023 100  98 - 111 mmol/L Final   CO2 10/19/2023 28  22 - 32 mmol/L Final   Glucose, Bld 10/19/2023 81  70 - 99 mg/dL Final   Glucose reference range applies only to samples taken after fasting for at least 8 hours.   BUN 10/19/2023 15  6 - 20 mg/dL Final   Creatinine, Ser 10/19/2023 1.19  0.61 - 1.24 mg/dL Final   Calcium 27/25/3664 10.6 (H)  8.9 - 10.3 mg/dL Final  Total Protein 10/19/2023 7.0  6.5 - 8.1 g/dL Final   Albumin 82/95/6213 4.3  3.5 - 5.0 g/dL Final   AST 08/65/7846 117 (H)  15 - 41 U/L Final   ALT 10/19/2023 74 (H)  0 - 44 U/L Final   Alkaline Phosphatase 10/19/2023 55  38 - 126 U/L Final   Total Bilirubin 10/19/2023 0.9  0.0 - 1.2 mg/dL Final   GFR, Estimated 10/19/2023 >60  >60 mL/min Final   Comment: (NOTE) Calculated using the CKD-EPI Creatinine Equation (2021)    Anion gap 10/19/2023 12  5 - 15 Final   Performed at [facility], [address], [place], [place] [address]   Cholesterol 10/19/2023 152  0 - 200 mg/dL Final   Triglycerides 28/41/3244 144  <150 mg/dL Final   HDL 07/05/7251 57  >40 mg/dL Final   Total CHOL/HDL Ratio 10/19/2023 2.7  RATIO Final   VLDL 10/19/2023 29  0 - 40 mg/dL Final   LDL Cholesterol 10/19/2023 66  0 - 99 mg/dL Final   Comment:        Total Cholesterol/HDL:CHD Risk Coronary Heart Disease Risk Table                     Men   Women  1/2 Average Risk   3.4   3.3  Average Risk       5.0   4.4  2 X Average Risk   9.6   7.1  3 X Average Risk  23.4   11.0        Use the calculated Patient Ratio above and the CHD Risk Table to determine the patient's CHD Risk.        ATP III CLASSIFICATION (LDL):  <100     mg/dL   Optimal  664-403  mg/dL   Near or Above                    Optimal  130-159  mg/dL   Borderline  474-259  mg/dL   High  >563     mg/dL   Very High Performed at [facility],  [address] [place], [place] [address]    Magnesium  10/19/2023 2.2  1.7 - 2.4 mg/dL Final   Performed at [facility], [address] [place], [place] [address]   POC Amphetamine UR 10/20/2023 None Detected  NONE DETECTED (Cut Off Level 1000 ng/mL) Final   POC Secobarbital (BAR) 10/20/2023 None Detected  NONE DETECTED (Cut Off Level 300 ng/mL) Final   POC Buprenorphine (BUP) 10/20/2023 None Detected  NONE DETECTED (Cut Off Level 10 ng/mL) Final   POC Oxazepam (BZO) 10/20/2023 None Detected  NONE DETECTED (Cut Off Level 300 ng/mL) Final   POC Cocaine UR 10/20/2023 None Detected  NONE DETECTED (Cut Off Level 300 ng/mL) Final   POC Methamphetamine UR 10/20/2023 None Detected  NONE DETECTED (Cut Off Level 1000 ng/mL) Final   POC Morphine 10/20/2023 None Detected  NONE DETECTED (Cut Off Level 300 ng/mL) Final   POC Methadone UR 10/20/2023 None Detected  NONE DETECTED (Cut Off Level 300 ng/mL) Final   POC Oxycodone UR 10/20/2023 None Detected  NONE DETECTED (Cut Off Level 100 ng/mL) Final   POC Marijuana UR 10/20/2023 None Detected  NONE DETECTED (Cut Off Level 50 ng/mL) Final   SARS Coronavirus 2 by RT PCR 10/20/2023 NEGATIVE  NEGATIVE Final   Performed at [facility], [address] [place],  [place] [address]  Admission on 05/19/2023, Discharged on 05/19/2023  Component Date Value Ref Range Status   Glucose-Capillary 05/19/2023 76  70 - 99 mg/dL Final   Glucose reference range applies only to samples taken after fasting for at least 8 hours.  Admission on 05/19/2023, Discharged on 05/19/2023  Component Date Value Ref Range Status   WBC 05/19/2023 5.8  4.0 - 10.5 K/uL Final   RBC 05/19/2023 4.61  4.22 - 5.81 MIL/uL Final   Hemoglobin 05/19/2023 12.3 (L)  13.0 - 17.0 g/dL Final   HCT 53/66/4403 40.0  39.0 - 52.0 % Final   MCV 05/19/2023 86.8  80.0 - 100.0 fL Final   MCH 05/19/2023 26.7  26.0 - 34.0 pg Final   MCHC 05/19/2023 30.8  30.0 - 36.0 g/dL Final   RDW 47/42/5956 12.9   11.5 - 15.5 % Final   Platelets 05/19/2023 225  150 - 400 K/uL Final   nRBC 05/19/2023 0.0  0.0 - 0.2 % Final   Neutrophils Relative % 05/19/2023 57  % Final   Neutro Abs 05/19/2023 3.3  1.7 - 7.7 K/uL Final   Lymphocytes Relative 05/19/2023 26  % Final   Lymphs Abs 05/19/2023 1.5  0.7 - 4.0 K/uL Final   Monocytes Relative 05/19/2023 11  % Final   Monocytes Absolute 05/19/2023 0.6  0.1 - 1.0 K/uL Final   Eosinophils Relative 05/19/2023 5  % Final   Eosinophils Absolute 05/19/2023 0.3  0.0 - 0.5 K/uL Final   Basophils Relative 05/19/2023 1  % Final   Basophils Absolute 05/19/2023 0.1  0.0 - 0.1 K/uL Final   Immature Granulocytes 05/19/2023 0  % Final   Abs Immature Granulocytes 05/19/2023 0.02  0.00 - 0.07 K/uL Final   Performed at [facility], [address], [place], [place] [address]   Sodium 05/19/2023 139  135 - 145 mmol/L Final   Potassium 05/19/2023 3.8  3.5 - 5.1 mmol/L Final   Chloride 05/19/2023 105  98 - 111 mmol/L Final   CO2 05/19/2023 28  22 - 32 mmol/L Final   Glucose, Bld 05/19/2023 86  70 - 99 mg/dL Final   Glucose reference range applies only to samples taken after fasting for at least 8 hours.   BUN 05/19/2023 13  6 - 20 mg/dL Final   Creatinine, Ser 05/19/2023 0.97  0.61 - 1.24 mg/dL Final   Calcium 43/32/9518 9.1  8.9 - 10.3 mg/dL Final   GFR, Estimated 05/19/2023 >60  >60 mL/min Final   Comment: (NOTE) Calculated using the CKD-EPI Creatinine Equation (2021)    Anion gap 05/19/2023 6  5 - 15 Final   Performed at [facility], [address] [place], [place] [address]   Troponin I (High Sensitivity) 05/19/2023 20 (H)  <18 ng/L Final   Comment: (NOTE) Elevated high sensitivity troponin I (hsTnI) values and significant  changes across serial measurements may suggest ACS but many other  chronic and acute conditions are known to elevate hsTnI results.  Refer to the "Links" section for chest pain algorithms and additional  guidance. Performed at [facility], [address] [place], [place] [address]    B Natriuretic Peptide 05/19/2023 9.5  0.0 - 100.0 pg/mL Final   Performed at [facility], [address] [place], [place] [address]   Troponin I (High Sensitivity) 05/19/2023 18 (H)  <18 ng/L Final   Comment: (NOTE) Elevated high sensitivity troponin I (hsTnI) values and significant  changes across serial measurements may suggest ACS but many other  chronic and acute conditions are known to elevate hsTnI results.  Refer to the "Links" section for chest pain algorithms and additional  guidance. Performed at [facility], [address] [place], [place] [address]   Admission on 05/17/2023, Discharged on 05/17/2023  Component Date Value Ref Range Status   Sodium 05/17/2023 137  135 - 145 mmol/L Final   Potassium 05/17/2023 3.5  3.5 - 5.1 mmol/L Final   Chloride 05/17/2023 105  98 - 111 mmol/L Final   CO2 05/17/2023 26  22 - 32 mmol/L Final   Glucose, Bld 05/17/2023 85  70 - 99 mg/dL Final   Glucose reference range applies only to samples taken after fasting for at least 8 hours.   BUN 05/17/2023 15  6 - 20 mg/dL Final   Creatinine, Ser 05/17/2023 1.29 (H)  0.61 - 1.24 mg/dL Final   Calcium 57/84/6962 8.7 (L)  8.9 - 10.3 mg/dL Final   Total Protein 95/28/4132 5.6 (L)  6.5 - 8.1 g/dL Final   Albumin 44/07/270 3.1 (L)  3.5 - 5.0 g/dL Final   AST 53/66/4403 29  15 - 41 U/L Final   ALT 05/17/2023 27  0 - 44 U/L Final   Alkaline Phosphatase 05/17/2023 61  38 - 126 U/L Final   Total Bilirubin 05/17/2023 0.4  <1.2 mg/dL Final   GFR, Estimated 05/17/2023 >60  >60 mL/min Final   Comment: (NOTE) Calculated using the CKD-EPI Creatinine Equation (2021)    Anion gap 05/17/2023 6  5 - 15 Final   Performed at [facility], [address] [place], [place] [address]   Alcohol, Ethyl (B) 05/17/2023 <10  <10 mg/dL Final   Comment: (NOTE) Lowest detectable limit for serum alcohol is 10 mg/dL.  For medical purposes  only. Performed at [facility], [address] [place], [place] [address]    WBC 05/17/2023 6.2  4.0 - 10.5 K/uL Final   RBC 05/17/2023 4.46  4.22 - 5.81 MIL/uL Final   Hemoglobin 05/17/2023 12.2 (L)  13.0 - 17.0 g/dL Final   HCT 75/64/3329 39.6  39.0 - 52.0 % Final   MCV 05/17/2023 88.8  80.0 - 100.0 fL Final   MCH 05/17/2023 27.4  26.0 - 34.0 pg Final   MCHC 05/17/2023 30.8  30.0 - 36.0 g/dL Final   RDW 51/88/4166 12.9  11.5 - 15.5 % Final   Platelets 05/17/2023 263  150 - 400 K/uL Final   nRBC 05/17/2023 0.0  0.0 - 0.2 % Final   Performed at [facility], [address] [place], [place] [address]  Admission on 05/15/2023, Discharged on 05/16/2023  Component Date Value Ref Range Status   WBC 05/15/2023 7.5  4.0 - 10.5 K/uL Final   RBC 05/15/2023 4.41  4.22 - 5.81 MIL/uL Final   Hemoglobin 05/15/2023 12.3 (L)  13.0 - 17.0 g/dL Final   HCT 60/04/9322 39.2  39.0 - 52.0 % Final   MCV 05/15/2023 88.9  80.0 - 100.0 fL Final   MCH 05/15/2023 27.9  26.0 - 34.0 pg Final   MCHC 05/15/2023 31.4  30.0 - 36.0 g/dL Final   RDW 55/73/2202 12.8  11.5 - 15.5 % Final   Platelets 05/15/2023 273  150 - 400 K/uL Final   nRBC 05/15/2023 0.0  0.0 - 0.2 % Final   Neutrophils Relative % 05/15/2023 68  % Final   Neutro Abs 05/15/2023 5.1  1.7 - 7.7 K/uL Final   Lymphocytes Relative 05/15/2023 20  % Final   Lymphs Abs 05/15/2023 1.5  0.7 - 4.0 K/uL Final   Monocytes Relative 05/15/2023 9  % Final   Monocytes Absolute 05/15/2023 0.7  0.1 - 1.0 K/uL Final   Eosinophils Relative 05/15/2023 2  % Final   Eosinophils Absolute 05/15/2023 0.1  0.0 - 0.5 K/uL Final   Basophils Relative 05/15/2023 1  % Final   Basophils Absolute 05/15/2023 0.0  0.0 - 0.1 K/uL Final   Immature Granulocytes 05/15/2023 0  % Final   Abs Immature Granulocytes 05/15/2023 0.03  0.00 - 0.07 K/uL Final   Performed at [facility], [address] [place], [place] [address]   Alcohol, Ethyl (B)  05/15/2023 <10  <10 mg/dL Final   Comment: (NOTE) Lowest detectable limit for serum alcohol is 10 mg/dL.  For medical purposes only. Performed at [facility], [address], [place], [place] [address]    Sodium 05/15/2023 137  135 - 145 mmol/L Final   Potassium 05/15/2023 3.5  3.5 - 5.1 mmol/L Final   Chloride 05/15/2023 102  98 - 111 mmol/L Final   CO2 05/15/2023 27  22 - 32 mmol/L Final   Glucose, Bld 05/15/2023 89  70 - 99 mg/dL Final   Glucose reference range applies only to samples taken after fasting for at least 8 hours.   BUN 05/15/2023 8  6 - 20 mg/dL Final   Creatinine, Ser 05/15/2023 0.95  0.61 - 1.24 mg/dL Final   Calcium 09/81/1914 9.1  8.9 - 10.3 mg/dL Final   GFR, Estimated 05/15/2023 >60  >60 mL/min Final   Comment: (NOTE) Calculated using the CKD-EPI Creatinine Equation (2021)    Anion gap 05/15/2023 8  5 - 15 Final   Performed at [facility], [address] [place], [place] [address]   Acetaminophen  (Tylenol ), Serum 05/15/2023 <10 (L)  10 - 30 ug/mL Final   Comment: (NOTE) Therapeutic concentrations vary significantly. A range of 10-30 ug/mL  may be an effective concentration for many patients. However, some  are best treated at concentrations outside of this range. Acetaminophen  concentrations >150 ug/mL at 4 hours after ingestion  and >50 ug/mL at 12 hours after ingestion are often associated with  toxic reactions.  Performed at [facility], [address] [place], [place] [address]    Salicylate Lvl 05/15/2023 <7.0 (L)  7.0 - 30.0 mg/dL Final   Performed at [facility], [address] [place], [place] [address]  Admission on 05/13/2023, Discharged on 05/14/2023  Component Date Value Ref Range Status   WBC 05/13/2023 6.8  4.0 - 10.5 K/uL Final   RBC 05/13/2023 4.59  4.22 - 5.81 MIL/uL Final   Hemoglobin 05/13/2023 12.5 (L)  13.0 - 17.0 g/dL Final   HCT 46/96/2952 40.1   39.0 - 52.0 % Final   MCV 05/13/2023 87.4  80.0 - 100.0 fL Final   MCH 05/13/2023 27.2  26.0 - 34.0 pg Final   MCHC 05/13/2023 31.2  30.0 - 36.0 g/dL Final   RDW 84/13/2440 12.9  11.5 - 15.5 % Final   Platelets 05/13/2023 308  150 - 400 K/uL Final   nRBC 05/13/2023 0.0  0.0 - 0.2 % Final   Performed at [facility], [address], [place], [place] [address]   Sodium 05/13/2023 139  135 - 145 mmol/L Final   Potassium 05/13/2023 3.8  3.5 -  5.1 mmol/L Final   Chloride 05/13/2023 105  98 - 111 mmol/L Final   CO2 05/13/2023 24  22 - 32 mmol/L Final   Glucose, Bld 05/13/2023 108 (H)  70 - 99 mg/dL Final   Glucose reference range applies only to samples taken after fasting for at least 8 hours.   BUN 05/13/2023 17  6 - 20 mg/dL Final   Creatinine, Ser 05/13/2023 1.02  0.61 - 1.24 mg/dL Final   Calcium 21/30/8657 9.2  8.9 - 10.3 mg/dL Final   GFR, Estimated 05/13/2023 >60  >60 mL/min Final   Comment: (NOTE) Calculated using the CKD-EPI Creatinine Equation (2021)    Anion gap 05/13/2023 10  5 - 15 Final   Performed at [facility], [address] [place], [place] [address]  Admission on 05/12/2023, Discharged on 05/13/2023  Component Date Value Ref Range Status   SARS Coronavirus 2 by RT PCR 05/13/2023 NEGATIVE  NEGATIVE Final   Comment: (NOTE) SARS-CoV-2 target nucleic acids are NOT DETECTED.  The SARS-CoV-2 RNA is generally detectable in upper respiratory specimens during the acute phase of infection. The lowest concentration of SARS-CoV-2 viral copies this assay can detect is 138 copies/mL. A negative result does not preclude SARS-Cov-2 infection and should not be used as the sole basis for treatment or other patient management decisions. A negative result may occur with  improper specimen collection/handling, submission of specimen other than nasopharyngeal swab, presence of viral mutation(s) within the areas targeted by this assay, and inadequate number of  viral copies(<138 copies/mL). A negative result must be combined with clinical observations, patient history, and epidemiological information. The expected result is Negative.  Fact Sheet for Patients:  BloggerCourse.com  Fact Sheet for Healthcare Providers:  SeriousBroker.it  This test is no                          t yet approved or cleared by the [place]  FDA and  has been authorized for detection and/or diagnosis of SARS-CoV-2 by FDA under an Emergency Use Authorization (EUA). This EUA will remain  in effect (meaning this test can be used) for the duration of the COVID-19 declaration under Section 564(b)(1) of the Act, 21 U.S.C.section 360bbb-3(b)(1), unless the authorization is terminated  or revoked sooner.       Influenza A by PCR 05/13/2023 NEGATIVE  NEGATIVE Final   Influenza B by PCR 05/13/2023 NEGATIVE  NEGATIVE Final   Comment: (NOTE) The Xpert Xpress SARS-CoV-2/FLU/RSV plus assay is intended as an aid in the diagnosis of influenza from Nasopharyngeal swab specimens and should not be used as a sole basis for treatment. Nasal washings and aspirates are unacceptable for Xpert Xpress SARS-CoV-2/FLU/RSV testing.  Fact Sheet for Patients: BloggerCourse.com  Fact Sheet for Healthcare Providers: SeriousBroker.it  This test is not yet approved or cleared by the [place]  FDA and has been authorized for detection and/or diagnosis of SARS-CoV-2 by FDA under an Emergency Use Authorization (EUA). This EUA will remain in effect (meaning this test can be used) for the duration of the COVID-19 declaration under Section 564(b)(1) of the Act, 21 U.S.C. section 360bbb-3(b)(1), unless the authorization is terminated or revoked.     Resp Syncytial Virus by PCR 05/13/2023 NEGATIVE  NEGATIVE Final   Comment: (NOTE) Fact Sheet for  Patients: BloggerCourse.com  Fact Sheet for Healthcare Providers: SeriousBroker.it  This test is not yet approved or cleared by the [place]  FDA and has been authorized for  detection and/or diagnosis of SARS-CoV-2 by FDA under an Emergency Use Authorization (EUA). This EUA will remain in effect (meaning this test can be used) for the duration of the COVID-19 declaration under Section 564(b)(1) of the Act, 21 U.S.C. section 360bbb-3(b)(1), unless the authorization is terminated or revoked.  Performed at [facility], [address], [place], [place] [address]   Admission on 05/04/2023, Discharged on 05/05/2023  Component Date Value Ref Range Status   Sodium 05/04/2023 136  135 - 145 mmol/L Final   Potassium 05/04/2023 3.7  3.5 - 5.1 mmol/L Final   Chloride 05/04/2023 98  98 - 111 mmol/L Final   CO2 05/04/2023 28  22 - 32 mmol/L Final   Glucose, Bld 05/04/2023 110 (H)  70 - 99 mg/dL Final   Glucose reference range applies only to samples taken after fasting for at least 8 hours.   BUN 05/04/2023 18  6 - 20 mg/dL Final   Creatinine, Ser 05/04/2023 1.11  0.61 - 1.24 mg/dL Final   Calcium 60/45/4098 9.5  8.9 - 10.3 mg/dL Final   Total Protein 11/91/4782 6.1 (L)  6.5 - 8.1 g/dL Final   Albumin 95/62/1308 3.7  3.5 - 5.0 g/dL Final   AST 65/78/4696 59 (H)  15 - 41 U/L Final   ALT 05/04/2023 44  0 - 44 U/L Final   Alkaline Phosphatase 05/04/2023 55  38 - 126 U/L Final   Total Bilirubin 05/04/2023 0.6  0.3 - 1.2 mg/dL Final   GFR, Estimated 05/04/2023 >60  >60 mL/min Final   Comment: (NOTE) Calculated using the CKD-EPI Creatinine Equation (2021)    Anion gap 05/04/2023 10  5 - 15 Final   Performed at [facility], [address] [place], [place] [address]   Alcohol, Ethyl (B) 05/04/2023 <10  <10 mg/dL Final   Comment: (NOTE) Lowest detectable limit for serum alcohol is 10 mg/dL.  For medical  purposes only. Performed at [facility], [address], [place], [place] [address]    WBC 05/04/2023 7.8  4.0 - 10.5 K/uL Final   RBC 05/04/2023 4.61  4.22 - 5.81 MIL/uL Final   Hemoglobin 05/04/2023 12.6 (L)  13.0 - 17.0 g/dL Final   HCT 40/04/2724 40.7  39.0 - 52.0 % Final   MCV 05/04/2023 88.3  80.0 - 100.0 fL Final   MCH 05/04/2023 27.3  26.0 - 34.0 pg Final   MCHC 05/04/2023 31.0  30.0 - 36.0 g/dL Final   RDW 36/64/4034 12.6  11.5 - 15.5 % Final   Platelets 05/04/2023 226  150 - 400 K/uL Final   nRBC 05/04/2023 0.0  0.0 - 0.2 % Final   Neutrophils Relative % 05/04/2023 65  % Final   Neutro Abs 05/04/2023 5.1  1.7 - 7.7 K/uL Final   Lymphocytes Relative 05/04/2023 19  % Final   Lymphs Abs 05/04/2023 1.5  0.7 - 4.0 K/uL Final   Monocytes Relative 05/04/2023 8  % Final   Monocytes Absolute 05/04/2023 0.6  0.1 - 1.0 K/uL Final   Eosinophils Relative 05/04/2023 6  % Final   Eosinophils Absolute 05/04/2023 0.5  0.0 - 0.5 K/uL Final   Basophils Relative 05/04/2023 1  % Final   Basophils Absolute 05/04/2023 0.1  0.0 - 0.1 K/uL Final   Immature Granulocytes 05/04/2023 1  % Final   Abs Immature Granulocytes 05/04/2023 0.04  0.00 - 0.07 K/uL Final   Performed at [facility], [address]  [place], [place] [address]  Admission on 05/03/2023, Discharged on 05/04/2023  Component Date Value Ref Range Status   SARS Coronavirus 2 by RT PCR 05/03/2023 NEGATIVE  NEGATIVE Final   Performed at [facility], [address] [place], [place] [address]  Admission on 04/29/2023, Discharged on 04/29/2023  Component Date Value Ref Range Status   Sodium 04/29/2023 140  135 - 145 mmol/L Final   Potassium 04/29/2023 3.4 (L)  3.5 - 5.1 mmol/L Final   Chloride 04/29/2023 101  98 - 111 mmol/L Final   CO2 04/29/2023 28  22 - 32 mmol/L Final   Glucose, Bld 04/29/2023 88  70 - 99 mg/dL Final   Glucose reference range applies only to samples taken after fasting  for at least 8 hours.   BUN 04/29/2023 20  6 - 20 mg/dL Final   Creatinine, Ser 04/29/2023 1.09  0.61 - 1.24 mg/dL Final   Calcium 30/86/5784 9.5  8.9 - 10.3 mg/dL Final   GFR, Estimated 04/29/2023 >60  >60 mL/min Final   Comment: (NOTE) Calculated using the CKD-EPI Creatinine Equation (2021)    Anion gap 04/29/2023 11  5 - 15 Final   Performed at [facility], [address] [place], [place] [address]   WBC 04/29/2023 9.9  4.0 - 10.5 K/uL Final   RBC 04/29/2023 4.99  4.22 - 5.81 MIL/uL Final   Hemoglobin 04/29/2023 13.7  13.0 - 17.0 g/dL Final   HCT 52/84/1324 42.9  39.0 - 52.0 % Final   MCV 04/29/2023 86.0  80.0 - 100.0 fL Final   MCH 04/29/2023 27.5  26.0 - 34.0 pg Final   MCHC 04/29/2023 31.9  30.0 - 36.0 g/dL Final   RDW 40/04/2724 12.6  11.5 - 15.5 % Final   Platelets 04/29/2023 219  150 - 400 K/uL Final   nRBC 04/29/2023 0.0  0.0 - 0.2 % Final   Performed at [facility], [address], [place], [place] [address]  There may be more visits with results that are not included.   Blood Alcohol level:  Lab Results  Component Value Date   ETH <10 05/17/2023   ETH <10 05/15/2023   Metabolic Disorder Labs: No results found for: "HGBA1C", "MPG" No results found for: "PROLACTIN" Lab Results  Component Value Date   CHOL 152 10/19/2023   TRIG 144 10/19/2023   HDL 57 10/19/2023   CHOLHDL 2.7 10/19/2023   VLDL 29 10/19/2023   LDLCALC 66 10/19/2023   Therapeutic Lab Levels: No results found for: "LITHIUM" No results found for: "VALPROATE" No results found for: "CBMZ"  Physical Findings   Flowsheet Row ED from 10/19/2023 in [facility] Most recent reading at 10/19/2023 10:22 PM ED from 05/20/2023 in [facility] Emergency Department at [facility] Most recent reading at 05/20/2023  7:07 PM ED from 05/20/2023 in [facility] Most recent reading at 05/20/2023  2:40 PM   C-SSRS RISK CATEGORY High Risk No Risk No Risk       Musculoskeletal  Strength & Muscle Tone: within normal limits Gait & Station: normal Patient leans: N/A  Psychiatric Specialty Exam  Presentation  General Appearance:  Casual; Disheveled  Eye Contact: Fair  Speech: Normal Rate  Speech Volume: Decreased  Handedness: Right  Mood and Affect  Mood: Depressed  Affect: Congruent  Thought Process  Thought Processes: Disorganized  Descriptions of Associations:Circumstantial  Orientation:Partial  Thought Content:Illogical  Diagnosis of Schizophrenia or Schizoaffective disorder  in past: Yes  Duration of Psychotic Symptoms: Greater than six months   Hallucinations:Hallucinations: Visual; Tactile Description of Visual Hallucinations: seeing and feeling spiders crawling on him  Ideas of Reference:Paranoia; Percusatory  Suicidal Thoughts:Suicidal Thoughts: No SI Passive Intent and/or Plan: Without Intent; Without Plan  Homicidal Thoughts:Homicidal Thoughts: No  Sensorium  Memory: Immediate Fair  Judgment: Poor  Insight: Poor  Executive Functions  Concentration: Poor  Attention Span: Poor  Recall: [employer] of Knowledge: Fair  Language: Poor   Psychomotor Activity  Psychomotor Activity: Psychomotor Activity: Normal  Assets  Assets: Resilience  Sleep  Sleep: Sleep: Poor Number of Hours of Sleep: 6  No data recorded  Physical Exam  Physical Exam Constitutional:      Appearance: Normal appearance.  Musculoskeletal:        General: Normal range of motion.     Cervical back: Normal range of motion.  Neurological:     General: No focal deficit present.     Mental Status: He is alert.    Review of Systems  Psychiatric/Behavioral:  Positive for depression and hallucinations. Negative for memory loss, substance abuse and suicidal ideas. The patient is nervous/anxious and has insomnia.   All other systems reviewed and are  negative.  Blood pressure 103/60, pulse 76, temperature 98.3 F (36.8 C), temperature source Oral, resp. rate 18, SpO2 98%. There is no height or weight on file to calculate BMI.  Treatment Plan Summary: Daily contact with patient to assess and evaluate symptoms and progress in treatment and Medication management Continuing to recommend inpatient behavioral health hospitalization for treatment and stabilization of mental status. Continuing current medication regimen while awaiting an inpatient bed: -Continue Remeron  15 mg nightly for sleep/depressive symptoms -Continue Seroquel  25 mg BID for mood stabilization -Continue Zyprexa  PRN-See MAR for details -Continue Hydroxyzine  25 mg TID PRN for anxiety -Continue Flexeril  PRN -Discontinue Cogentin  1 mg since Haldol  was discontinued -Continue Naproxen /MOM/Maalox PRN-See MAR for details  Robet Chiquito, NP 10/21/2023 9:11 AM

## 2023-10-21 NOTE — ED Notes (Signed)
Pt laying in bed calm and cooperative no pain or distress noted will continue to monitor for safety

## 2023-10-22 MED ORDER — QUETIAPINE FUMARATE 25 MG PO TABS: 25.0000 mg | ORAL_TABLET | Freq: Every day | ORAL | Status: DC

## 2023-10-22 MED ORDER — QUETIAPINE FUMARATE 25 MG PO TABS: 25.0000 mg | ORAL_TABLET | Freq: Two times a day (BID) | ORAL | 0 refills | Status: DC

## 2023-10-22 MED ORDER — MIRTAZAPINE 15 MG PO TABS: 15.0000 mg | ORAL_TABLET | Freq: Every day | ORAL | 0 refills | Status: DC

## 2023-10-22 MED ORDER — MIRTAZAPINE 15 MG PO TABS
15.0000 mg | ORAL_TABLET | Freq: Every day | ORAL | 0 refills | Status: DC
Start: 2023-10-22 — End: 2023-10-31

## 2023-10-22 NOTE — ED Notes (Signed)
 Pt sleeping@this  time breathing even and unlabored will continue to monitor for safety

## 2023-10-22 NOTE — ED Notes (Signed)
 Patient discharged in no acute distress. Belongings returned from secured locker. AVS given and reviewed. 7-day prescriptions given. Pt advised to follow-up upstairs this week as he only has one week of samples and two weeks supply was sent to the pharmacy. Pt voiced understanding. Bus pass provided.

## 2023-10-22 NOTE — ED Provider Notes (Signed)
 FBC/OBS ASAP Discharge Summary  Date and Time: 10/22/2023 10:50 PM  Name: Aaron Rubio  MRN:  [id number]   Discharge Diagnoses:  Final diagnoses:  Psychosis, unspecified psychosis type [facility])  Homelessness   HPI: Aaron Rubio is a 43 y.o. AA male with a  prior mental health history of Schizophrenia who initially presented to this urgent care on 4/18 with complaints of tactile & visual hallucinations .  Was admitted to the continuous assessment unit and patient initially recommended for inpatient psychiatric admission and was faxed out.  Face-to-face by this provider, chart reviewed, and case consulted with Dr. Docia Freeman on 10/22/2023  Subjective:   Currently on assessment patient is observed laying in his bed awake.  He is pleasant upon approach.  He is alert/oriented x 4, cooperative, and attentive.  He explains that he was in a mental health facility in [place]  and upon discharge he requested a bus ticket ended up in the [place] area.  He has family in the area of which he has not seen since coming to the area.  States once he arrived in [place] he requested to be brought to a mental health facility because he was out of medications.  He admits upon admission he was having some visual and tactile hallucinations but states that only happens when he feels overwhelmed.  Reports there have been people while on the unit that have been loud and difficult for staff to control and this has caused him much distress.  However yesterday he states they move the disruptive people onto the other side of the unit and it has been helpful.  He is currently denying any auditory or visual hallucinations.  He is continuing to deny any suicidal homicidal ideations.  He denies access to firearms/weapons.  He is able to verbally contract for safety.  Has any concerns with appetite or sleep.  He admits to feeling some paranoia throughout the day but states that is normal for him.  He does not appear  to be responding to internal/external stimuli.  Discussed recommendation of inpatient psychiatric admission and patient is declining.  He is requesting to be discharged.  States, "I have to meet up with my family, I have lots to do".  He currently does not meet any criteria for involuntary commitment  Does not want any collateral to be contacted.  He has not spoken to his family since he got out of prison in January and does not want them to be contacted by this provider.  Stay Summary:   Aaron Rubio was admitted to observation for story and tactile hallucinations.  He was treated with the following medications Seroquel  25 mg twice daily and Remeron  15 mg nightly.Medications were tolerated with no adverse reactions.  Aaron Rubio was discharged with current medication and was instructed on how to take medications as prescribed.  He was provided 7-day sample of medications and instructed to follow-up [facility] for open access walk-in hours.   Upon completion of this admission the Aaron Rubio was both mentally and medically stable for discharge denying suicidal/homicidal ideation, auditory/visual/tactile hallucinations, delusional thoughts and paranoia.      Total Time spent with patient: 20 minutes  Past Psychiatric History: see H&P Past Medical History: see H&P Family History: see H&P Family Psychiatric History: see H&P Social History: see H&P Tobacco Cessation:  N/A, patient does not currently use tobacco products  Current Medications:  No current facility-administered medications for this encounter.   Current Outpatient  Medications  Medication Sig Dispense Refill   mirtazapine  (REMERON ) 15 MG tablet Take 1 tablet (15 mg total) by mouth at bedtime. 7 tablet 0   QUEtiapine  (SEROQUEL ) 25 MG tablet Take 1 tablet (25 mg total) by mouth at bedtime.     mirtazapine  (REMERON ) 15 MG tablet Take 1 tablet (15 mg total) by mouth at bedtime. 14 tablet 0   QUEtiapine  (SEROQUEL )  25 MG tablet Take 1 tablet (25 mg total) by mouth 2 (two) times daily. 28 tablet 0    PTA Medications:  PTA Medications  Medication Sig   mirtazapine  (REMERON ) 15 MG tablet Take 1 tablet (15 mg total) by mouth at bedtime.   QUEtiapine  (SEROQUEL ) 25 MG tablet Take 1 tablet (25 mg total) by mouth at bedtime.   mirtazapine  (REMERON ) 15 MG tablet Take 1 tablet (15 mg total) by mouth at bedtime.   QUEtiapine  (SEROQUEL ) 25 MG tablet Take 1 tablet (25 mg total) by mouth 2 (two) times daily.   Facility Ordered Medications  Medication   [COMPLETED] LORazepam  (ATIVAN ) tablet 2 mg        No data to display          Flowsheet Row ED from 10/19/2023 in [facility] Most recent reading at 10/19/2023 10:22 PM ED from 05/20/2023 in [facility] Emergency Department at [facility] Most recent reading at 05/20/2023  7:07 PM ED from 05/20/2023 in [facility] Most recent reading at 05/20/2023  2:40 PM  C-SSRS RISK CATEGORY High Risk No Risk No Risk       Musculoskeletal  Strength & Muscle Tone: within normal limits Gait & Station: normal Patient leans: N/A  Psychiatric Specialty Exam  Presentation  General Appearance:  Appropriate for Environment; Casual  Eye Contact: Good  Speech: Clear and Coherent; Normal Rate  Speech Volume: Normal  Handedness: Right   Mood and Affect  Mood: Anxious; Depressed  Affect: Congruent   Thought Process  Thought Processes: Coherent  Descriptions of Associations:Circumstantial  Orientation:Full (Time, Place and Person)  Thought Content:Logical  Diagnosis of Schizophrenia or Schizoaffective disorder in past: Yes  Duration of Psychotic Symptoms: Greater than six months   Hallucinations:Hallucinations: None Description of Visual Hallucinations: seeing and feeling spiders crawling on him  Ideas of Reference:None  Suicidal Thoughts:Suicidal Thoughts: No  Homicidal  Thoughts:Homicidal Thoughts: No   Sensorium  Memory: Immediate Fair; Recent Fair; Remote Fair  Judgment: Good  Insight: Good   Executive Functions  Concentration: Good  Attention Span: Good  Recall: Good  Fund of Knowledge: Good  Language: Good   Psychomotor Activity  Psychomotor Activity: Psychomotor Activity: Normal   Assets  Assets: Resilience; Social Support; Talents/Skills   Sleep  Sleep: Sleep: Fair   No data recorded  Physical Exam  Physical Exam Vitals and nursing note reviewed.  Constitutional:      Appearance: Normal appearance.  Eyes:     General:        Right eye: No discharge.        Left eye: No discharge.  Pulmonary:     Effort: Pulmonary effort is normal.  Musculoskeletal:        General: Normal range of motion.     Cervical back: Normal range of motion.  Skin:    Coloration: Skin is not jaundiced or pale.  Neurological:     Mental Status: He is alert and oriented to person, place, and time.  Psychiatric:        Attention  and Perception: Attention and perception normal.        Mood and Affect: Mood is anxious.        Speech: Speech normal.        Behavior: Behavior is cooperative.        Thought Content: Thought content normal.        Cognition and Memory: Cognition normal.        Judgment: Judgment is impulsive.    Review of Systems  Constitutional:  Negative for chills and fever.  HENT:  Negative for hearing loss.   Respiratory:  Negative for cough and shortness of breath.   Cardiovascular:  Negative for chest pain.  Musculoskeletal: Negative.   Psychiatric/Behavioral:  The patient is nervous/anxious.    Blood pressure 133/84, pulse 65, temperature 98 F (36.7 C), temperature source Oral, resp. rate 19, SpO2 98%. There is no height or weight on file to calculate BMI.  Demographic Factors:  Male, Low socioeconomic status, and Unemployed  Loss Factors: Legal issues and Financial problems/change in socioeconomic  status  Historical Factors: Impulsivity  Risk Reduction Factors:   Sense of responsibility to family, Positive social support, Positive therapeutic relationship, and Positive coping skills or problem solving skills  Continued Clinical Symptoms:  Severe Anxiety and/or Agitation Depression:   Impulsivity  Cognitive Features That Contribute To Risk:  None    Suicide Risk:  Minimal: No identifiable suicidal ideation.  Patients presenting with no risk factors but with morbid ruminations; may be classified as minimal risk based on the severity of the depressive symptoms  Plan Of Care/Follow-up recommendations:  Activity:  as tolerated  Diet:  regular  Disposition: Discharge patient  Provided outpatient psychiatric resources for medication management and therapy.  Also discussed open access walk-in hours for [facility] on second floor  Provided 7-day sample for Seroquel  25 mg twice daily and Remeron  15 mg nightly.   Costella Dirks, NP 10/22/2023, 10:50 PM

## 2023-10-22 NOTE — Discharge Instructions (Signed)
 Discharge recommendations:   Medications: Patient is to take medications as prescribed. The patient or patient's guardian is to contact a medical professional and/or outpatient provider to address any new side effects that develop. The patient or the patient's guardian should update outpatient providers of any new medications and/or medication changes.    Outpatient Follow up: Please review list of outpatient resources for psychiatry and counseling. Please follow up with your primary care provider for all medical related needs.    Therapy: We recommend that patient participate in individual therapy to address mental health concerns.   Atypical antipsychotics: If you are prescribed an atypical antipsychotic, it is recommended that your height, weight, BMI, blood pressure, fasting lipid panel, and fasting blood sugar be monitored by your outpatient providers.  Safety:   The following safety precautions should be taken:   No sharp objects. This includes scissors, razors, scrapers, and putty knives.   Chemicals should be removed and locked up.   Medications should be removed and locked up.   Weapons should be removed and locked up. This includes firearms, knives and instruments that can be used to cause injury.   The patient should abstain from use of illicit substances/drugs and abuse of any medications.  If symptoms worsen or do not continue to improve or if the patient becomes actively suicidal or homicidal then it is recommended that the patient return to the closest hospital emergency department, the [facility], or call 911 for further evaluation and treatment. National Suicide Prevention Lifeline 1-800-SUICIDE or 207-281-0968.  About 988 988 offers 24/7 access to trained crisis counselors who can help people experiencing mental health-related distress. People can call or text 988 or chat 988lifeline.org for themselves or if they are worried about a  loved one who may need crisis support.   [facility]  [facility] [address] [place], [place], [address] [phone] phone  Offers food and emergency or transitional housing to men, women, or families in need. Clients participate in programs and workshops developed to promote self-sufficiency and personal development.Call or walk in. Applications are accepted Monday, Wednesday, and Friday by appointment only. Need photo ID and proof of income.  [facility] [address], [place], [place] [address] [phone] Population served: Adult men & women (58 years old and older, able to perform activities for daily living) Documents required: Valid ID & Social Security Card   [facility] Florance Hun [address], [place], [place]  [address] [phone] Population served: Male veterans 18+ with substance abuse/mental health issues Eligibility: By referral only  [facility] [address] [place], [place] [address] [phone] Population served: Males 18+ Documents required: Valid ID & Social Security Card  If you are in need of housing through the shelter, contact the [employer] at [phone].              Based on what you have shared, a list of resources for outpatient therapy and psychiatry is provided below to get you started back on treatment.  It is imperative that you follow through with treatment within 5-7 days from the day of discharge to prevent any further risk to your safety or mental well-being.  You are not limited to the list provided.  In case of an urgent crisis, you may contact the Mobile Crisis Unit with [facility] at [phone]) [phone].        Outpatient Services for Therapy and  Medication Management for [facility] [address], [place], [address] [phone] phone  New Patient Assessment/Therapy Walk-ins Monday and  Wednesday: 8am until slots are full. Every 1st and 2nd Friday: 1pm - 5pm  NO ASSESSMENT/THERAPY WALK-INS ON TUESDAYS OR THURSDAYS  New Patient Psychiatry/Medication Management Walk-ins Monday-Friday: 8am-11am  For all walk-ins, we ask that you arrive by 7:30am because patient will be seen in the order of arrival.  Availability is limited; therefore, you may not be seen on the same day that you walk-in.  Our goal is to serve and meet the needs of our community to the best of our ability.   [facility] [address] [place], [place], [address] [phone] phone  [facility] [address] [place], [place], [address] [phone] phone [phone] phone ([address], Anthem/Elevance, [address], [place], [address], [address], Healthy [place], [place], [place], [address], [employer], [place], UHC, [employer], [place], Out of Network)  [employer], [place] [address] [place], [place], [address] [phone] phone ([place], Anthem/Elevance, [employer] Options/Carelon, BCBS, [address], [place], [place], [place], [place], [employer], [place], [place], [place], [facility])  [employer] [address] [place], [place], [address] [phone] phone (Medicaid, ask about other insurance)  [facility] [address], [address] [place], [place], [address] [phone] phone [phone] fax ([address] [place] , [place], [place], [place] Health Choice, UHC, General Electric, Self-Pay)  Sarah Lempka [address] [place], [place], [address] [phone] phone ([name] Anthem/Elevance, [address], [address], [place], [employer], [place], [place], [place], [employer], [place], [place], [place], [facility])  [facility] - 6-8 MONTH WAIT FOR THERAPY; [facility]NAG[facility] [address], [address] [place], [place],  [address] [phone] phone ([address] [employer] [address]  [place], [address], [place], [place], Friday Health Plans, [address], BCBS Healthy [place], [place], [address], [place], [place], [place], [place], Tricare, UHC, Safeco Corporation, [place])  [facility] [address] [place], [place], [address] [phone] phone  [facility] [address] [place], [place], [address] [phone] phone  [facility] [address], [address] [place], [place], [address] [phone] phone  [facility] of the [facility] - THERAPY ONLY [address]  [place], [place], [address] [phone] phone  [facility], [place] [address], [place], [address] [phone] phone  [facility] [address] [place], [place], [address] [phone] phone [phone] fax  [facility] [address] [place], [place], [address] [phone] phone [phone] fax  [facility] [phone] [address] [place], [place], [address] [phone] phone  Arnold Bicker [address] [place], [place], [address]  [phone] phone  [facility]  (Adults Only) [address] [place], [place], [address]  [phone] phone [phone] fax

## 2023-10-22 NOTE — ED Notes (Signed)
 Patient A&Ox4. Has been calm, cooperative, and appropriate with peers. He denies intent to harm self/others. Denies A/VH. Patient denies any physical pain or discomfort. No acute distress observed. Routine safety checks conducted according to facility protocol. Patient agreed to notify staff should thoughts of harm toward self or others arise. We will continue to monitor for safety.

## 2023-10-24 ENCOUNTER — Ambulatory Visit (HOSPITAL_COMMUNITY)
Admission: EM | Admit: 2023-10-24 | Discharge: 2023-10-25 | Disposition: A | Discharge status: Other Acute Inpatient Hospital | Attending: Nurse Practitioner | Admitting: Nurse Practitioner

## 2023-10-24 ENCOUNTER — Other Ambulatory Visit: Payer: Self-pay

## 2023-10-24 DIAGNOSIS — Z91148 Patient's other noncompliance with medication regimen for other reason: Secondary | ICD-10-CM | POA: Diagnosis not present

## 2023-10-24 DIAGNOSIS — Z560 Unemployment, unspecified: Secondary | ICD-10-CM | POA: Insufficient documentation

## 2023-10-24 DIAGNOSIS — Z5900 Homelessness unspecified: Secondary | ICD-10-CM | POA: Diagnosis not present

## 2023-10-24 DIAGNOSIS — Z79899 Other long term (current) drug therapy: Secondary | ICD-10-CM | POA: Insufficient documentation

## 2023-10-24 DIAGNOSIS — F201 Disorganized schizophrenia: Secondary | ICD-10-CM | POA: Insufficient documentation

## 2023-10-24 DIAGNOSIS — R45851 Suicidal ideations: Secondary | ICD-10-CM | POA: Insufficient documentation

## 2023-10-24 LAB — POCT URINE DRUG SCREEN - MANUAL ENTRY (I-SCREEN)
POC Amphetamine UR: NOT DETECTED
POC Buprenorphine (BUP): NOT DETECTED
POC Cocaine UR: POSITIVE — AB
POC Marijuana UR: NOT DETECTED
POC Methadone UR: NOT DETECTED
POC Methamphetamine UR: NOT DETECTED
POC Morphine: NOT DETECTED
POC Oxazepam (BZO): NOT DETECTED
POC Oxycodone UR: NOT DETECTED
POC Secobarbital (BAR): NOT DETECTED

## 2023-10-24 MED ORDER — LORAZEPAM 2 MG/ML IJ SOLN: 2.0000 mg | Freq: Three times a day (TID) | INTRAMUSCULAR | Status: DC | PRN

## 2023-10-24 MED ORDER — ACETAMINOPHEN 325 MG PO TABS: 650.0000 mg | ORAL_TABLET | Freq: Four times a day (QID) | ORAL | Status: DC | PRN

## 2023-10-24 MED ORDER — HALOPERIDOL LACTATE 5 MG/ML IJ SOLN: 10.0000 mg | Freq: Three times a day (TID) | INTRAMUSCULAR | Status: DC | PRN

## 2023-10-24 MED ORDER — MIRTAZAPINE 15 MG PO TABS
15.0000 mg | ORAL_TABLET | Freq: Every day | ORAL | Status: DC
  Administered 2023-10-24: 15 mg via ORAL
  Filled 2023-10-24: qty 1

## 2023-10-24 MED ORDER — ALUM & MAG HYDROXIDE-SIMETH 200-200-20 MG/5ML PO SUSP: 30.0000 mL | ORAL | Status: DC | PRN

## 2023-10-24 MED ORDER — DIPHENHYDRAMINE HCL 50 MG PO CAPS: 50.0000 mg | ORAL_CAPSULE | Freq: Three times a day (TID) | ORAL | Status: DC | PRN

## 2023-10-24 MED ORDER — DIPHENHYDRAMINE HCL 50 MG/ML IJ SOLN: 50.0000 mg | Freq: Three times a day (TID) | INTRAMUSCULAR | Status: DC | PRN

## 2023-10-24 MED ORDER — HALOPERIDOL LACTATE 5 MG/ML IJ SOLN: 5.0000 mg | Freq: Three times a day (TID) | INTRAMUSCULAR | Status: DC | PRN

## 2023-10-24 MED ORDER — TRAZODONE HCL 50 MG PO TABS: 50.0000 mg | ORAL_TABLET | Freq: Every evening | ORAL | Status: DC | PRN

## 2023-10-24 MED ORDER — HALOPERIDOL 5 MG PO TABS
5.0000 mg | ORAL_TABLET | Freq: Three times a day (TID) | ORAL | Status: DC | PRN
  Administered 2023-10-24: 5 mg via ORAL
  Filled 2023-10-24: qty 1

## 2023-10-24 MED ORDER — HYDROXYZINE HCL 25 MG PO TABS: 25.0000 mg | ORAL_TABLET | Freq: Three times a day (TID) | ORAL | Status: DC | PRN

## 2023-10-24 MED ORDER — MAGNESIUM HYDROXIDE 400 MG/5ML PO SUSP: 30.0000 mL | Freq: Every day | ORAL | Status: DC | PRN

## 2023-10-24 MED ORDER — QUETIAPINE FUMARATE 25 MG PO TABS
25.0000 mg | ORAL_TABLET | Freq: Two times a day (BID) | ORAL | Status: DC
  Administered 2023-10-24 – 2023-10-25 (×3): 25 mg via ORAL
  Filled 2023-10-24 (×3): qty 1
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
## 2023-10-24 NOTE — ED Provider Notes (Signed)
 [facility] Continuous Assessment Admission H&P  Date: 10/24/23 Patient Name: Aaron Rubio MRN: [id number] Chief Complaint: " I ate the plan"  Diagnoses:  Final diagnoses:  Disorganized schizophrenia (HCC)  Homelessness  Non compliance w medication regimen    HPI: Aaron Rubio is a 43 year old male with psychiatric history of schizophrenia, who presented voluntarily as a walk in to [facility] with complaints of homeless and SI.  Patient was seen face to face by this provider and chart reviewed. Per chart review, patient was admitted to the [facility] between 4/18-4/21 due to complaints of visual and Tactile hallucinations.  Patient has had 20 ED visits in the past 6  months with different medical and psychiatric complaints. When stabilized, he will benefit from an ACT Team and Long acting injectable antipsychotic medication as it appears he is not medication compliant with oral meds, and does not follow up with treatment recommendations.   He is homeless and not established with an outpatient psychiatric provider. He denies illicit substance use tonight.   On presentation appears to be responding to internal stimuli as he is observed mumbling and talking to himself continuously and staring wide eyed at this provider. He is inattentive and veers off on an incoherent tangent while talking to provider. He is asked the purpose of his [facility] visit, and he responds " I tried to survive being homeless, people stole my information.........incoherent ramble".   Patient is asked when was the last time he took his medications and the name of his medications and he responds "I don't know".   Patient was asked about his recent [facility] stay between 10/19/23-10/22/23, and he responds " I haven't been here before, I just got out of [place], [place] in April........I'm trying to figure out how to get more medicine".   Patient is asked about his plan after his [facility] stay, and he responds " I ate the  plan.............".  He however denies SI/HI and when asked if he was experiencing AVH, he responds "no, I'm talking to myself...., more incoherent rambling".   On evaluation, patient is alert, oriented to name, and cooperative with provider. Speech is clear and illogical. Pt appears disheveled. Eye contact is bizarre. Mood is anxious, affect is congruent with mood. Thought process is disorganized and thought content is tangential. Pt denies SI/HI/AVH. There is objective indication that the patient is responding to internal stimuli as he is observed talking and mumbling to himself continuously during the assessment. No delusions elicited during this assessment.     Total Time spent with patient: 30 minutes  Musculoskeletal  Strength & Muscle Tone: within normal limits Gait & Station: normal Patient leans: N/A  Psychiatric Specialty Exam  Presentation General Appearance:  Disheveled  Eye Contact: Other (comment) (Bizarre)  Speech: Normal Rate  Speech Volume: Normal  Handedness: Right   Mood and Affect  Mood: Anxious  Affect: Congruent   Thought Process  Thought Processes: Disorganized  Descriptions of Associations:Tangential  Orientation:Partial  Thought Content:Illogical; Tangential  Diagnosis of Schizophrenia or Schizoaffective disorder in past: Yes  Duration of Psychotic Symptoms: Greater than six months  Hallucinations:Hallucinations: None  Ideas of Reference:None  Suicidal Thoughts:Suicidal Thoughts: No  Homicidal Thoughts:Homicidal Thoughts: No   Sensorium  Memory: Immediate Poor  Judgment: Poor  Insight: Lacking   Executive Functions  Concentration: Poor  Attention Span: Poor  Recall: Poor  Fund of Knowledge: Poor  Language: Poor   Psychomotor Activity  Psychomotor Activity: Psychomotor Activity: Mannerisms   Assets  Assets:  Desire for Improvement   Sleep  Sleep: Sleep: Poor   Nutritional Assessment (For OBS  and FBC admissions only) Has the patient had a weight loss or gain of 10 pounds or more in the last 3 months?: No Has the patient had a decrease in food intake/or appetite?: No Does the patient have dental problems?: No Does the patient have eating habits or behaviors that may be indicators of an eating disorder including binging or inducing vomiting?: No Has the patient recently lost weight without trying?: 0 Has the patient been eating poorly because of a decreased appetite?: 0 Malnutrition Screening Tool Score: 0    Physical Exam Constitutional:      Appearance: He is not toxic-appearing or diaphoretic.  HENT:     Nose: No congestion.  Pulmonary:     Effort: No respiratory distress.  Chest:     Chest wall: No tenderness.  Neurological:     Mental Status: He is alert. Mental status is at baseline.  Psychiatric:        Attention and Perception: Attention and perception normal.        Mood and Affect: Mood is anxious. Affect is inappropriate.        Speech: Speech normal.        Behavior: Behavior is cooperative.    Review of Systems  Constitutional:  Negative for chills, diaphoresis and fever.  HENT:  Negative for congestion.   Respiratory:  Negative for cough, shortness of breath and wheezing.   Cardiovascular:  Negative for chest pain and palpitations.  Gastrointestinal:  Negative for diarrhea, nausea and vomiting.  Neurological:  Negative for dizziness, seizures and headaches.  Psychiatric/Behavioral:  The patient is nervous/anxious.     Blood pressure 107/83, pulse (!) 107, temperature 98.3 F (36.8 C), temperature source Oral, resp. rate 18, SpO2 99%. There is no height or weight on file to calculate BMI.  Past Psychiatric History: See H & P   Is the patient at risk to self? Yes  Has the patient been a risk to self in the past 6 months? Yes .    Has the patient been a risk to self within the distant past? Yes   Is the patient a risk to others? Yes   Has the  patient been a risk to others in the past 6 months? Yes   Has the patient been a risk to others within the distant past? Yes   Past Medical History: See Chart  Family History: N/A  Social History: N/A  Last Labs:  Admission on 10/19/2023, Discharged on 10/22/2023  Component Date Value Ref Range Status   WBC 10/19/2023 10.8 (H)  4.0 - 10.5 K/uL Final   RBC 10/19/2023 5.59  4.22 - 5.81 MIL/uL Final   Hemoglobin 10/19/2023 14.6  13.0 - 17.0 g/dL Final   HCT 16/04/9603 46.1  39.0 - 52.0 % Final   MCV 10/19/2023 82.5  80.0 - 100.0 fL Final   MCH 10/19/2023 26.1  26.0 - 34.0 pg Final   MCHC 10/19/2023 31.7  30.0 - 36.0 g/dL Final   RDW 54/03/8118 13.2  11.5 - 15.5 % Final   Platelets 10/19/2023 275  150 - 400 K/uL Final   nRBC 10/19/2023 0.0  0.0 - 0.2 % Final   Neutrophils Relative % 10/19/2023 62  % Final   Neutro Abs 10/19/2023 6.8  1.7 - 7.7 K/uL Final   Lymphocytes Relative 10/19/2023 21  % Final   Lymphs Abs 10/19/2023 2.3  0.7 -  4.0 K/uL Final   Monocytes Relative 10/19/2023 10  % Final   Monocytes Absolute 10/19/2023 1.0  0.1 - 1.0 K/uL Final   Eosinophils Relative 10/19/2023 5  % Final   Eosinophils Absolute 10/19/2023 0.6 (H)  0.0 - 0.5 K/uL Final   Basophils Relative 10/19/2023 1  % Final   Basophils Absolute 10/19/2023 0.1  0.0 - 0.1 K/uL Final   Immature Granulocytes 10/19/2023 1  % Final   Abs Immature Granulocytes 10/19/2023 0.10 (H)  0.00 - 0.07 K/uL Final   Performed at [facility], [address] [place], [place] [address]   Sodium 10/19/2023 140  135 - 145 mmol/L Final   Potassium 10/19/2023 4.8  3.5 - 5.1 mmol/L Final   Chloride 10/19/2023 100  98 - 111 mmol/L Final   CO2 10/19/2023 28  22 - 32 mmol/L Final   Glucose, Bld 10/19/2023 81  70 - 99 mg/dL Final   Glucose reference range applies only to samples taken after fasting for at least 8 hours.   BUN 10/19/2023 15  6 - 20 mg/dL Final   Creatinine, Ser 10/19/2023 1.19  0.61 - 1.24 mg/dL Final    Calcium 96/29/5284 10.6 (H)  8.9 - 10.3 mg/dL Final   Total Protein 13/24/4010 7.0  6.5 - 8.1 g/dL Final   Albumin 27/25/3664 4.3  3.5 - 5.0 g/dL Final   AST 40/34/7425 117 (H)  15 - 41 U/L Final   ALT 10/19/2023 74 (H)  0 - 44 U/L Final   Alkaline Phosphatase 10/19/2023 55  38 - 126 U/L Final   Total Bilirubin 10/19/2023 0.9  0.0 - 1.2 mg/dL Final   GFR, Estimated 10/19/2023 >60  >60 mL/min Final   Comment: (NOTE) Calculated using the CKD-EPI Creatinine Equation (2021)    Anion gap 10/19/2023 12  5 - 15 Final   Performed at [facility], [address] [place], [place] [address]   Cholesterol 10/19/2023 152  0 - 200 mg/dL Final   Triglycerides 75/64/3329 144  <150 mg/dL Final   HDL 51/88/4166 57  >40 mg/dL Final   Total CHOL/HDL Ratio 10/19/2023 2.7  RATIO Final   VLDL 10/19/2023 29  0 - 40 mg/dL Final   LDL Cholesterol 10/19/2023 66  0 - 99 mg/dL Final   Comment:        Total Cholesterol/HDL:CHD Risk Coronary Heart Disease Risk Table                     Men   Women  1/2 Average Risk   3.4   3.3  Average Risk       5.0   4.4  2 X Average Risk   9.6   7.1  3 X Average Risk  23.4   11.0        Use the calculated Patient Ratio above and the CHD Risk Table to determine the patient's CHD Risk.        ATP III CLASSIFICATION (LDL):  <100     mg/dL   Optimal  063-016  mg/dL   Near or Above                    Optimal  130-159  mg/dL   Borderline  010-932  mg/dL   High  >355     mg/dL   Very High Performed at [facility], [address], [place], [place] [address]    Magnesium  10/19/2023 2.2  1.7 - 2.4 mg/dL Final   Performed at [facility], [address], [place], [place] [address]   POC Amphetamine UR 10/20/2023 None Detected  NONE DETECTED (Cut Off Level 1000 ng/mL) Final   POC Secobarbital (BAR) 10/20/2023 None Detected  NONE DETECTED (Cut Off Level 300 ng/mL) Final   POC Buprenorphine (BUP) 10/20/2023 None Detected  NONE DETECTED (Cut Off Level 10 ng/mL)  Final   POC Oxazepam (BZO) 10/20/2023 None Detected  NONE DETECTED (Cut Off Level 300 ng/mL) Final   POC Cocaine UR 10/20/2023 None Detected  NONE DETECTED (Cut Off Level 300 ng/mL) Final   POC Methamphetamine UR 10/20/2023 None Detected  NONE DETECTED (Cut Off Level 1000 ng/mL) Final   POC Morphine 10/20/2023 None Detected  NONE DETECTED (Cut Off Level 300 ng/mL) Final   POC Methadone UR 10/20/2023 None Detected  NONE DETECTED (Cut Off Level 300 ng/mL) Final   POC Oxycodone UR 10/20/2023 None Detected  NONE DETECTED (Cut Off Level 100 ng/mL) Final   POC Marijuana UR 10/20/2023 None Detected  NONE DETECTED (Cut Off Level 50 ng/mL) Final   SARS Coronavirus 2 by RT PCR 10/20/2023 NEGATIVE  NEGATIVE Final   Performed at [facility], [address] [place], [place] [address]  Admission on 05/19/2023, Discharged on 05/19/2023  Component Date Value Ref Range Status   Glucose-Capillary 05/19/2023 76  70 - 99 mg/dL Final   Glucose reference range applies only to samples taken after fasting for at least 8 hours.  Admission on 05/19/2023, Discharged on 05/19/2023  Component Date Value Ref Range Status   WBC 05/19/2023 5.8  4.0 - 10.5 K/uL Final   RBC 05/19/2023 4.61  4.22 - 5.81 MIL/uL Final   Hemoglobin 05/19/2023 12.3 (L)  13.0 - 17.0 g/dL Final   HCT 95/28/4132 40.0  39.0 - 52.0 % Final   MCV 05/19/2023 86.8  80.0 - 100.0 fL Final   MCH 05/19/2023 26.7  26.0 - 34.0 pg Final   MCHC 05/19/2023 30.8  30.0 - 36.0 g/dL Final   RDW 44/07/270 12.9  11.5 - 15.5 % Final   Platelets 05/19/2023 225  150 - 400 K/uL Final   nRBC 05/19/2023 0.0  0.0 - 0.2 % Final   Neutrophils Relative % 05/19/2023 57  % Final   Neutro Abs 05/19/2023 3.3  1.7 - 7.7 K/uL Final   Lymphocytes Relative 05/19/2023 26  % Final   Lymphs Abs 05/19/2023 1.5  0.7 - 4.0 K/uL Final   Monocytes Relative 05/19/2023 11  % Final   Monocytes Absolute 05/19/2023 0.6  0.1 - 1.0 K/uL Final   Eosinophils Relative 05/19/2023 5  %  Final   Eosinophils Absolute 05/19/2023 0.3  0.0 - 0.5 K/uL Final   Basophils Relative 05/19/2023 1  % Final   Basophils Absolute 05/19/2023 0.1  0.0 - 0.1 K/uL Final   Immature Granulocytes 05/19/2023 0  % Final   Abs Immature Granulocytes 05/19/2023 0.02  0.00 - 0.07 K/uL Final   Performed at [facility], [address] [place], [place] [address]   Sodium 05/19/2023 139  135 - 145 mmol/L Final   Potassium 05/19/2023 3.8  3.5 - 5.1 mmol/L Final   Chloride 05/19/2023 105  98 - 111 mmol/L Final   CO2 05/19/2023 28  22 - 32 mmol/L Final   Glucose, Bld 05/19/2023 86  70 - 99 mg/dL Final   Glucose reference range applies only to samples taken after fasting for at least  8 hours.   BUN 05/19/2023 13  6 - 20 mg/dL Final   Creatinine, Ser 05/19/2023 0.97  0.61 - 1.24 mg/dL Final   Calcium 40/98/1191 9.1  8.9 - 10.3 mg/dL Final   GFR, Estimated 05/19/2023 >60  >60 mL/min Final   Comment: (NOTE) Calculated using the CKD-EPI Creatinine Equation (2021)    Anion gap 05/19/2023 6  5 - 15 Final   Performed at [facility], [address], [place], [place] [address]   Troponin I (High Sensitivity) 05/19/2023 20 (H)  <18 ng/L Final   Comment: (NOTE) Elevated high sensitivity troponin I (hsTnI) values and significant  changes across serial measurements may suggest ACS but many other  chronic and acute conditions are known to elevate hsTnI results.  Refer to the "Links" section for chest pain algorithms and additional  guidance. Performed at [facility], [address] [place], [place] [address]    B Natriuretic Peptide 05/19/2023 9.5  0.0 - 100.0 pg/mL Final   Performed at [facility], [address], [place], [place] [address]   Troponin I (High Sensitivity) 05/19/2023 18 (H)  <18 ng/L Final   Comment: (NOTE) Elevated high sensitivity troponin I (hsTnI) values and significant  changes across serial measurements may suggest ACS but many other  chronic and acute  conditions are known to elevate hsTnI results.  Refer to the "Links" section for chest pain algorithms and additional  guidance. Performed at [facility], [address], [place], [place] [address]   Admission on 05/17/2023, Discharged on 05/17/2023  Component Date Value Ref Range Status   Sodium 05/17/2023 137  135 - 145 mmol/L Final   Potassium 05/17/2023 3.5  3.5 - 5.1 mmol/L Final   Chloride 05/17/2023 105  98 - 111 mmol/L Final   CO2 05/17/2023 26  22 - 32 mmol/L Final   Glucose, Bld 05/17/2023 85  70 - 99 mg/dL Final   Glucose reference range applies only to samples taken after fasting for at least 8 hours.   BUN 05/17/2023 15  6 - 20 mg/dL Final   Creatinine, Ser 05/17/2023 1.29 (H)  0.61 - 1.24 mg/dL Final   Calcium 29/52/8413 8.7 (L)  8.9 - 10.3 mg/dL Final   Total Protein 24/40/1027 5.6 (L)  6.5 - 8.1 g/dL Final   Albumin 25/36/6440 3.1 (L)  3.5 - 5.0 g/dL Final   AST 34/74/2595 29  15 - 41 U/L Final   ALT 05/17/2023 27  0 - 44 U/L Final   Alkaline Phosphatase 05/17/2023 61  38 - 126 U/L Final   Total Bilirubin 05/17/2023 0.4  <1.2 mg/dL Final   GFR, Estimated 05/17/2023 >60  >60 mL/min Final   Comment: (NOTE) Calculated using the CKD-EPI Creatinine Equation (2021)    Anion gap 05/17/2023 6  5 - 15 Final   Performed at [facility], [address] [place], [place] [address]   Alcohol, Ethyl (B) 05/17/2023 <10  <10 mg/dL Final   Comment: (NOTE) Lowest detectable limit for serum alcohol is 10 mg/dL.  For medical purposes only. Performed at [facility], [address] [place], [place] [address]    WBC 05/17/2023 6.2  4.0 - 10.5 K/uL Final   RBC 05/17/2023 4.46  4.22 - 5.81 MIL/uL Final   Hemoglobin 05/17/2023 12.2 (L)  13.0 - 17.0 g/dL Final   HCT 95/18/8416 39.6  39.0 - 52.0 % Final   MCV 05/17/2023 88.8  80.0 -  100.0 fL Final   MCH 05/17/2023 27.4  26.0 - 34.0 pg Final   MCHC 05/17/2023 30.8  30.0 - 36.0 g/dL Final   RDW 78/29/5621 12.9  11.5  - 15.5 % Final   Platelets 05/17/2023 263  150 - 400 K/uL Final   nRBC 05/17/2023 0.0  0.0 - 0.2 % Final   Performed at [facility], [address] [place], [place] [address]  Admission on 05/15/2023, Discharged on 05/16/2023  Component Date Value Ref Range Status   WBC 05/15/2023 7.5  4.0 - 10.5 K/uL Final   RBC 05/15/2023 4.41  4.22 - 5.81 MIL/uL Final   Hemoglobin 05/15/2023 12.3 (L)  13.0 - 17.0 g/dL Final   HCT 78/46/9629 39.2  39.0 - 52.0 % Final   MCV 05/15/2023 88.9  80.0 - 100.0 fL Final   MCH 05/15/2023 27.9  26.0 - 34.0 pg Final   MCHC 05/15/2023 31.4  30.0 - 36.0 g/dL Final   RDW 52/84/1324 12.8  11.5 - 15.5 % Final   Platelets 05/15/2023 273  150 - 400 K/uL Final   nRBC 05/15/2023 0.0  0.0 - 0.2 % Final   Neutrophils Relative % 05/15/2023 68  % Final   Neutro Abs 05/15/2023 5.1  1.7 - 7.7 K/uL Final   Lymphocytes Relative 05/15/2023 20  % Final   Lymphs Abs 05/15/2023 1.5  0.7 - 4.0 K/uL Final   Monocytes Relative 05/15/2023 9  % Final   Monocytes Absolute 05/15/2023 0.7  0.1 - 1.0 K/uL Final   Eosinophils Relative 05/15/2023 2  % Final   Eosinophils Absolute 05/15/2023 0.1  0.0 - 0.5 K/uL Final   Basophils Relative 05/15/2023 1  % Final   Basophils Absolute 05/15/2023 0.0  0.0 - 0.1 K/uL Final   Immature Granulocytes 05/15/2023 0  % Final   Abs Immature Granulocytes 05/15/2023 0.03  0.00 - 0.07 K/uL Final   Performed at [facility], [address], [place], [place] [address]   Alcohol, Ethyl (B) 05/15/2023 <10  <10 mg/dL Final   Comment: (NOTE) Lowest detectable limit for serum alcohol is 10 mg/dL.  For medical purposes only. Performed at [facility], [address], [place], [place] [address]    Sodium 05/15/2023 137  135 - 145 mmol/L Final   Potassium 05/15/2023 3.5  3.5 - 5.1 mmol/L Final   Chloride 05/15/2023 102  98 - 111 mmol/L Final   CO2 05/15/2023 27  22 - 32 mmol/L Final   Glucose, Bld 05/15/2023 89  70 -  99 mg/dL Final   Glucose reference range applies only to samples taken after fasting for at least 8 hours.   BUN 05/15/2023 8  6 - 20 mg/dL Final   Creatinine, Ser 05/15/2023 0.95  0.61 - 1.24 mg/dL Final   Calcium 64/40/3474 9.1  8.9 - 10.3 mg/dL Final   GFR, Estimated 05/15/2023 >60  >60 mL/min Final   Comment: (NOTE) Calculated using the CKD-EPI Creatinine Equation (2021)    Anion gap 05/15/2023 8  5 - 15 Final   Performed at [facility], [address] [place], [place] [address]   Acetaminophen  (Tylenol ), Serum 05/15/2023 <10 (L)  10 - 30 ug/mL Final   Comment: (NOTE) Therapeutic concentrations vary significantly. A range of 10-30 ug/mL  may be an effective concentration for many patients. However, some  are best treated at concentrations outside of this range. Acetaminophen  concentrations >150 ug/mL at 4 hours after ingestion  and >  50 ug/mL at 12 hours after ingestion are often associated with  toxic reactions.  Performed at [facility], [address] [place], [place] [address]    Salicylate Lvl 05/15/2023 <7.0 (L)  7.0 - 30.0 mg/dL Final   Performed at [facility], [address] [place], [place] [address]  Admission on 05/13/2023, Discharged on 05/14/2023  Component Date Value Ref Range Status   WBC 05/13/2023 6.8  4.0 - 10.5 K/uL Final   RBC 05/13/2023 4.59  4.22 - 5.81 MIL/uL Final   Hemoglobin 05/13/2023 12.5 (L)  13.0 - 17.0 g/dL Final   HCT 09/81/1914 40.1  39.0 - 52.0 % Final   MCV 05/13/2023 87.4  80.0 - 100.0 fL Final   MCH 05/13/2023 27.2  26.0 - 34.0 pg Final   MCHC 05/13/2023 31.2  30.0 - 36.0 g/dL Final   RDW 78/29/5621 12.9  11.5 - 15.5 % Final   Platelets 05/13/2023 308  150 - 400 K/uL Final   nRBC 05/13/2023 0.0  0.0 - 0.2 % Final   Performed at [facility], [address], [place], [place] [address]   Sodium 05/13/2023 139  135 - 145 mmol/L Final   Potassium 05/13/2023 3.8  3.5 - 5.1  mmol/L Final   Chloride 05/13/2023 105  98 - 111 mmol/L Final   CO2 05/13/2023 24  22 - 32 mmol/L Final   Glucose, Bld 05/13/2023 108 (H)  70 - 99 mg/dL Final   Glucose reference range applies only to samples taken after fasting for at least 8 hours.   BUN 05/13/2023 17  6 - 20 mg/dL Final   Creatinine, Ser 05/13/2023 1.02  0.61 - 1.24 mg/dL Final   Calcium 78/46/9629 9.2  8.9 - 10.3 mg/dL Final   GFR, Estimated 05/13/2023 >60  >60 mL/min Final   Comment: (NOTE) Calculated using the CKD-EPI Creatinine Equation (2021)    Anion gap 05/13/2023 10  5 - 15 Final   Performed at [facility], [address], [place], [place] [address]  Admission on 05/12/2023, Discharged on 05/13/2023  Component Date Value Ref Range Status   SARS Coronavirus 2 by RT PCR 05/13/2023 NEGATIVE  NEGATIVE Final   Comment: (NOTE) SARS-CoV-2 target nucleic acids are NOT DETECTED.  The SARS-CoV-2 RNA is generally detectable in upper respiratory specimens during the acute phase of infection. The lowest concentration of SARS-CoV-2 viral copies this assay can detect is 138 copies/mL. A negative result does not preclude SARS-Cov-2 infection and should not be used as the sole basis for treatment or other patient management decisions. A negative result may occur with  improper specimen collection/handling, submission of specimen other than nasopharyngeal swab, presence of viral mutation(s) within the areas targeted by this assay, and inadequate number of viral copies(<138 copies/mL). A negative result must be combined with clinical observations, patient history, and epidemiological information. The expected result is Negative.  Fact Sheet for Patients:  BloggerCourse.com  Fact Sheet for Healthcare Providers:  SeriousBroker.it  This test is no                          t yet approved or cleared by the [place]  FDA and  has been authorized for detection  and/or diagnosis of SARS-CoV-2 by FDA under an Emergency Use Authorization (EUA). This EUA will remain  in effect (meaning this test can be used) for the duration of the COVID-19 declaration under Section 564(b)(1) of  the Act, 21 U.S.C.section 360bbb-3(b)(1), unless the authorization is terminated  or revoked sooner.       Influenza A by PCR 05/13/2023 NEGATIVE  NEGATIVE Final   Influenza B by PCR 05/13/2023 NEGATIVE  NEGATIVE Final   Comment: (NOTE) The Xpert Xpress SARS-CoV-2/FLU/RSV plus assay is intended as an aid in the diagnosis of influenza from Nasopharyngeal swab specimens and should not be used as a sole basis for treatment. Nasal washings and aspirates are unacceptable for Xpert Xpress SARS-CoV-2/FLU/RSV testing.  Fact Sheet for Patients: BloggerCourse.com  Fact Sheet for Healthcare Providers: SeriousBroker.it  This test is not yet approved or cleared by the [place]  FDA and has been authorized for detection and/or diagnosis of SARS-CoV-2 by FDA under an Emergency Use Authorization (EUA). This EUA will remain in effect (meaning this test can be used) for the duration of the COVID-19 declaration under Section 564(b)(1) of the Act, 21 U.S.C. section 360bbb-3(b)(1), unless the authorization is terminated or revoked.     Resp Syncytial Virus by PCR 05/13/2023 NEGATIVE  NEGATIVE Final   Comment: (NOTE) Fact Sheet for Patients: BloggerCourse.com  Fact Sheet for Healthcare Providers: SeriousBroker.it  This test is not yet approved or cleared by the [place]  FDA and has been authorized for detection and/or diagnosis of SARS-CoV-2 by FDA under an Emergency Use Authorization (EUA). This EUA will remain in effect (meaning this test can be used) for the duration of the COVID-19 declaration under Section 564(b)(1) of the Act, 21 U.S.C. section 360bbb-3(b)(1), unless  the authorization is terminated or revoked.  Performed at [facility], [address], [place], [place] [address]   Admission on 05/04/2023, Discharged on 05/05/2023  Component Date Value Ref Range Status   Sodium 05/04/2023 136  135 - 145 mmol/L Final   Potassium 05/04/2023 3.7  3.5 - 5.1 mmol/L Final   Chloride 05/04/2023 98  98 - 111 mmol/L Final   CO2 05/04/2023 28  22 - 32 mmol/L Final   Glucose, Bld 05/04/2023 110 (H)  70 - 99 mg/dL Final   Glucose reference range applies only to samples taken after fasting for at least 8 hours.   BUN 05/04/2023 18  6 - 20 mg/dL Final   Creatinine, Ser 05/04/2023 1.11  0.61 - 1.24 mg/dL Final   Calcium 40/98/1191 9.5  8.9 - 10.3 mg/dL Final   Total Protein 47/82/9562 6.1 (L)  6.5 - 8.1 g/dL Final   Albumin 13/01/6577 3.7  3.5 - 5.0 g/dL Final   AST 46/96/2952 59 (H)  15 - 41 U/L Final   ALT 05/04/2023 44  0 - 44 U/L Final   Alkaline Phosphatase 05/04/2023 55  38 - 126 U/L Final   Total Bilirubin 05/04/2023 0.6  0.3 - 1.2 mg/dL Final   GFR, Estimated 05/04/2023 >60  >60 mL/min Final   Comment: (NOTE) Calculated using the CKD-EPI Creatinine Equation (2021)    Anion gap 05/04/2023 10  5 - 15 Final   Performed at [facility], [address] [place], [place] [address]   Alcohol, Ethyl (B) 05/04/2023 <10  <10 mg/dL Final   Comment: (NOTE) Lowest detectable limit for serum alcohol is 10 mg/dL.  For medical purposes only. Performed at [facility], [address] [place], [place] [address]    WBC 05/04/2023 7.8  4.0 - 10.5 K/uL Final   RBC 05/04/2023 4.61  4.22 - 5.81 MIL/uL Final   Hemoglobin 05/04/2023 12.6 (L)  13.0 - 17.0 g/dL Final  HCT 05/04/2023 40.7  39.0 - 52.0 % Final   MCV 05/04/2023 88.3  80.0 - 100.0 fL Final   MCH 05/04/2023 27.3  26.0 - 34.0 pg Final   MCHC 05/04/2023 31.0  30.0 - 36.0 g/dL Final   RDW 67/34/1937 12.6  11.5 - 15.5 % Final   Platelets 05/04/2023 226   150 - 400 K/uL Final   nRBC 05/04/2023 0.0  0.0 - 0.2 % Final   Neutrophils Relative % 05/04/2023 65  % Final   Neutro Abs 05/04/2023 5.1  1.7 - 7.7 K/uL Final   Lymphocytes Relative 05/04/2023 19  % Final   Lymphs Abs 05/04/2023 1.5  0.7 - 4.0 K/uL Final   Monocytes Relative 05/04/2023 8  % Final   Monocytes Absolute 05/04/2023 0.6  0.1 - 1.0 K/uL Final   Eosinophils Relative 05/04/2023 6  % Final   Eosinophils Absolute 05/04/2023 0.5  0.0 - 0.5 K/uL Final   Basophils Relative 05/04/2023 1  % Final   Basophils Absolute 05/04/2023 0.1  0.0 - 0.1 K/uL Final   Immature Granulocytes 05/04/2023 1  % Final   Abs Immature Granulocytes 05/04/2023 0.04  0.00 - 0.07 K/uL Final   Performed at [facility], [address], [place], [place] [address]  Admission on 05/03/2023, Discharged on 05/04/2023  Component Date Value Ref Range Status   SARS Coronavirus 2 by RT PCR 05/03/2023 NEGATIVE  NEGATIVE Final   Performed at [facility], [address], [place], [place] [address]  Admission on 04/29/2023, Discharged on 04/29/2023  Component Date Value Ref Range Status   Sodium 04/29/2023 140  135 - 145 mmol/L Final   Potassium 04/29/2023 3.4 (L)  3.5 - 5.1 mmol/L Final   Chloride 04/29/2023 101  98 - 111 mmol/L Final   CO2 04/29/2023 28  22 - 32 mmol/L Final   Glucose, Bld 04/29/2023 88  70 - 99 mg/dL Final   Glucose reference range applies only to samples taken after fasting for at least 8 hours.   BUN 04/29/2023 20  6 - 20 mg/dL Final   Creatinine, Ser 04/29/2023 1.09  0.61 - 1.24 mg/dL Final   Calcium 29/92/4268 9.5  8.9 - 10.3 mg/dL Final   GFR, Estimated 04/29/2023 >60  >60 mL/min Final   Comment: (NOTE) Calculated using the CKD-EPI Creatinine Equation (2021)    Anion gap 04/29/2023 11  5 - 15 Final   Performed at [facility], [address], [place], [place] [address]   WBC 04/29/2023 9.9  4.0 - 10.5 K/uL Final   RBC 04/29/2023 4.99  4.22 - 5.81  MIL/uL Final   Hemoglobin 04/29/2023 13.7  13.0 - 17.0 g/dL Final   HCT 22/29/7989 42.9  39.0 - 52.0 % Final   MCV 04/29/2023 86.0  80.0 - 100.0 fL Final   MCH 04/29/2023 27.5  26.0 - 34.0 pg Final   MCHC 04/29/2023 31.9  30.0 - 36.0 g/dL Final   RDW 21/19/4174 12.6  11.5 - 15.5 % Final   Platelets 04/29/2023 219  150 - 400 K/uL Final   nRBC 04/29/2023 0.0  0.0 - 0.2 % Final   Performed at [facility], [address] [place], [place] [address]  There may be more visits with results that are not included.    Allergies: Patient has no known allergies.  Medications:  Facility Ordered Medications  Medication   acetaminophen  (TYLENOL ) tablet 650 mg   alum & mag hydroxide-simeth (MAALOX/MYLANTA)  200-200-20 MG/5ML suspension 30 mL   magnesium  hydroxide (MILK OF MAGNESIA) suspension 30 mL   haloperidol  (HALDOL ) tablet 5 mg   And   diphenhydrAMINE  (BENADRYL ) capsule 50 mg   haloperidol  lactate (HALDOL ) injection 5 mg   And   diphenhydrAMINE  (BENADRYL ) injection 50 mg   And   LORazepam  (ATIVAN ) injection 2 mg   haloperidol  lactate (HALDOL ) injection 10 mg   And   diphenhydrAMINE  (BENADRYL ) injection 50 mg   And   LORazepam  (ATIVAN ) injection 2 mg   hydrOXYzine  (ATARAX ) tablet 25 mg   traZODone  (DESYREL ) tablet 50 mg   QUEtiapine  (SEROQUEL ) tablet 25 mg   mirtazapine  (REMERON ) tablet 15 mg   PTA Medications  Medication Sig   mirtazapine  (REMERON ) 15 MG tablet Take 1 tablet (15 mg total) by mouth at bedtime.   QUEtiapine  (SEROQUEL ) 25 MG tablet Take 1 tablet (25 mg total) by mouth 2 (two) times daily.   mirtazapine  (REMERON ) 15 MG tablet Take 1 tablet (15 mg total) by mouth at bedtime.   QUEtiapine  (SEROQUEL ) 25 MG tablet Take 1 tablet (25 mg total) by mouth at bedtime.      Medical Decision Making  Recommend inpatient psychiatric admission for stabilization and treatment.  Patient presents as very disorganized with rambling speech.  He is also inattentive  and unable to remember when he last took his medications after he was discharged from the [facility] 4/21. He is currently experiencing homelessness and denies illicit substance use.  Given his current presentation, patient is a danger to himself and others and will benefit from inpatient psychiatric hospitalization.  When stabilized, patient will benefit from and ACT Team and use of LAI due to non-compliance with oral medications and treatment plan after discharge. He has history of frequent ED/UC visits.  Patient will be admitted to the continuous observation unit for safety monitoring pending transfer to an inpatient psychiatric unit. LCSW will seek bed placement.  Patient had labs drawn recently while inpatient at the [facility] between 10/19/23-10/22/23.  Lab Orders         POCT Urine Drug Screen - (I-Screen)      Home medications restarted -Seroquel  25 mg p.o. twice daily for mood stabilization/schizophrenia -Remeron  50 mg p.o. daily at bedtime depressive symptoms  Other PRNs -Trazodone  50 mg p.o. nightly as needed insomnia -Atarax  25 mg p.o. 3 times daily as needed anxiety -MOM 30 mL p.o. daily as needed constipation -Maalox 30 mL p.o. daily as needed indigestion -Tylenol  650 mg p.o. every 6 hours as needed pain  - As needed agitation protocol medications   Recommendations  Based on my evaluation the patient does not appear to have an emergency medical condition.  Recommend inpatient psychiatric admission for stabilization and treatment.  Sandralee Crow, NP 10/24/23  6:08 AM

## 2023-10-24 NOTE — ED Notes (Signed)
 Pt is currently asleep, resp are even and unlabored. There are no apparent s/sx of distress. No further concerns at this time.
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
## 2023-10-24 NOTE — ED Notes (Signed)
 Pts BP still low, rechecked twice. NP Brian Campanile made aware of latest BP readings.

## 2023-10-24 NOTE — ED Notes (Signed)
 Attempting to push PO fluids for pt.

## 2023-10-24 NOTE — ED Notes (Signed)
 Pt sleeping@this  time breathing even and unlabored will continue to monitor for safety

## 2023-10-24 NOTE — ED Notes (Signed)
 Pt resting in bed, eating dinner. Denies pain or any concerns. No s/sx of distress at this time.

## 2023-10-24 NOTE — ED Notes (Signed)
 Patient is sitting in bed, A/O x4, patient appears to be in no acute distress , respirations are even and unlabored, will continue to monitor patient for safety.

## 2023-10-24 NOTE — ED Provider Notes (Signed)
 Behavioral Health Progress Note  Date and Time: 10/24/2023 3:26 PM Name: Aaron Rubio MRN:  [id number]  Subjective:   Aaron Rubio is a 43 year old male with psychiatric history of schizophrenia, who presented voluntarily as a walk in to [facility] with complaints of homeless and SI.   Patient is seen and examined face-to-face sitting up on bed.  Chart reviewed and findings shared with the treatment team and consults with the attending psychiatrist Dr. Gertrude Kuba.  As per chart review, pt is currently homeless and non-compliant with medications, and has had 20 ED visits in the past 6  months with different medical and psychiatric complaints. Patient may benefit from an ACT Team and Long acting injectable antipsychotic medication when stabilized.  Objective: Patient presents alert and oriented to name and place only, but disorganized and scattered. He appears disheveled & malodorous. Speech is disorganized. He reports anxiety as #7/10 and depression as #8/10, with 10 being high severity. He reports appetite is good and slept about 8 hours last night. Thought process and thought content illogical, with delusional thinking and paranoia. He denies SI, HI, or AVH. Reports last auditory hallucination was 2 days ago, when he heard voices telling him he is no good and needed to kill himself.  Diagnosis:  Final diagnoses:  Disorganized schizophrenia (HCC)  Homelessness  Non compliance w medication regimen   Total Time spent with patient: 45 minutes  Past Psychiatric History: See H&P Past Medical History: See H&P Family History: See H&P Family Psychiatric  History: See H&P Social History: See H&P  Additional Social History:    Pain Medications: See MAR Prescriptions: See MAR Over the Counter: See MAR History of alcohol / drug use?:  (UTA) Longest period of sobriety (when/how long): UTA Negative Consequences of Use:  (UTA) Withdrawal Symptoms:  (UTA)   Sleep: Good  Appetite:   Good  Current Medications:  Current Facility-Administered Medications  Medication Dose Route Frequency Provider Last Rate Last Admin   acetaminophen  (TYLENOL ) tablet 650 mg  650 mg Oral Q6H PRN Onuoha, Chinwendu V, NP       alum & mag hydroxide-simeth (MAALOX/MYLANTA) 200-200-20 MG/5ML suspension 30 mL  30 mL Oral Q4H PRN Onuoha, Chinwendu V, NP       haloperidol  (HALDOL ) tablet 5 mg  5 mg Oral TID PRN Onuoha, Chinwendu V, NP   5 mg at 10/24/23 0554   And   diphenhydrAMINE  (BENADRYL ) capsule 50 mg  50 mg Oral TID PRN Onuoha, Chinwendu V, NP       haloperidol  lactate (HALDOL ) injection 5 mg  5 mg Intramuscular TID PRN Onuoha, Chinwendu V, NP       And   diphenhydrAMINE  (BENADRYL ) injection 50 mg  50 mg Intramuscular TID PRN Onuoha, Chinwendu V, NP       And   LORazepam  (ATIVAN ) injection 2 mg  2 mg Intramuscular TID PRN Onuoha, Chinwendu V, NP       haloperidol  lactate (HALDOL ) injection 10 mg  10 mg Intramuscular TID PRN Onuoha, Chinwendu V, NP       And   diphenhydrAMINE  (BENADRYL ) injection 50 mg  50 mg Intramuscular TID PRN Onuoha, Chinwendu V, NP       And   LORazepam  (ATIVAN ) injection 2 mg  2 mg Intramuscular TID PRN Onuoha, Chinwendu V, NP       hydrOXYzine  (ATARAX ) tablet 25 mg  25 mg Oral TID PRN Onuoha, Chinwendu V, NP       magnesium  hydroxide (MILK OF MAGNESIA)  suspension 30 mL  30 mL Oral Daily PRN Onuoha, Chinwendu V, NP       mirtazapine  (REMERON ) tablet 15 mg  15 mg Oral QHS Onuoha, Chinwendu V, NP       QUEtiapine  (SEROQUEL ) tablet 25 mg  25 mg Oral BID Onuoha, Chinwendu V, NP   25 mg at 10/24/23 0554   traZODone  (DESYREL ) tablet 50 mg  50 mg Oral QHS PRN Onuoha, Chinwendu V, NP       Current Outpatient Medications  Medication Sig Dispense Refill   mirtazapine  (REMERON ) 15 MG tablet Take 1 tablet (15 mg total) by mouth at bedtime. 14 tablet 0   QUEtiapine  (SEROQUEL ) 25 MG tablet Take 1 tablet (25 mg total) by mouth 2 (two) times daily. 28 tablet 0   Labs  Lab Results:   Admission on 10/19/2023, Discharged on 10/22/2023  Component Date Value Ref Range Status   WBC 10/19/2023 10.8 (H)  4.0 - 10.5 K/uL Final   RBC 10/19/2023 5.59  4.22 - 5.81 MIL/uL Final   Hemoglobin 10/19/2023 14.6  13.0 - 17.0 g/dL Final   HCT 40/98/1191 46.1  39.0 - 52.0 % Final   MCV 10/19/2023 82.5  80.0 - 100.0 fL Final   MCH 10/19/2023 26.1  26.0 - 34.0 pg Final   MCHC 10/19/2023 31.7  30.0 - 36.0 g/dL Final   RDW 47/82/9562 13.2  11.5 - 15.5 % Final   Platelets 10/19/2023 275  150 - 400 K/uL Final   nRBC 10/19/2023 0.0  0.0 - 0.2 % Final   Neutrophils Relative % 10/19/2023 62  % Final   Neutro Abs 10/19/2023 6.8  1.7 - 7.7 K/uL Final   Lymphocytes Relative 10/19/2023 21  % Final   Lymphs Abs 10/19/2023 2.3  0.7 - 4.0 K/uL Final   Monocytes Relative 10/19/2023 10  % Final   Monocytes Absolute 10/19/2023 1.0  0.1 - 1.0 K/uL Final   Eosinophils Relative 10/19/2023 5  % Final   Eosinophils Absolute 10/19/2023 0.6 (H)  0.0 - 0.5 K/uL Final   Basophils Relative 10/19/2023 1  % Final   Basophils Absolute 10/19/2023 0.1  0.0 - 0.1 K/uL Final   Immature Granulocytes 10/19/2023 1  % Final   Abs Immature Granulocytes 10/19/2023 0.10 (H)  0.00 - 0.07 K/uL Final   Performed at [facility], [address], [place], [place] [address]   Sodium 10/19/2023 140  135 - 145 mmol/L Final   Potassium 10/19/2023 4.8  3.5 - 5.1 mmol/L Final   Chloride 10/19/2023 100  98 - 111 mmol/L Final   CO2 10/19/2023 28  22 - 32 mmol/L Final   Glucose, Bld 10/19/2023 81  70 - 99 mg/dL Final   Glucose reference range applies only to samples taken after fasting for at least 8 hours.   BUN 10/19/2023 15  6 - 20 mg/dL Final   Creatinine, Ser 10/19/2023 1.19  0.61 - 1.24 mg/dL Final   Calcium 57/84/6962 10.6 (H)  8.9 - 10.3 mg/dL Final   Total Protein 95/28/4132 7.0  6.5 - 8.1 g/dL Final   Albumin 44/07/270 4.3  3.5 - 5.0 g/dL Final   AST 53/66/4403 117 (H)  15 - 41 U/L Final   ALT 10/19/2023 74 (H)  0 -  44 U/L Final   Alkaline Phosphatase 10/19/2023 55  38 - 126 U/L Final   Total Bilirubin 10/19/2023 0.9  0.0 - 1.2 mg/dL Final   GFR, Estimated 10/19/2023 >60  >60 mL/min Final  Comment: (NOTE) Calculated using the CKD-EPI Creatinine Equation (2021)    Anion gap 10/19/2023 12  5 - 15 Final   Performed at [facility], [address], [place], [place] [address]   Cholesterol 10/19/2023 152  0 - 200 mg/dL Final   Triglycerides 60/45/4098 144  <150 mg/dL Final   HDL 11/91/4782 57  >40 mg/dL Final   Total CHOL/HDL Ratio 10/19/2023 2.7  RATIO Final   VLDL 10/19/2023 29  0 - 40 mg/dL Final   LDL Cholesterol 10/19/2023 66  0 - 99 mg/dL Final   Comment:        Total Cholesterol/HDL:CHD Risk Coronary Heart Disease Risk Table                     Men   Women  1/2 Average Risk   3.4   3.3  Average Risk       5.0   4.4  2 X Average Risk   9.6   7.1  3 X Average Risk  23.4   11.0        Use the calculated Patient Ratio above and the CHD Risk Table to determine the patient's CHD Risk.        ATP III CLASSIFICATION (LDL):  <100     mg/dL   Optimal  956-213  mg/dL   Near or Above                    Optimal  130-159  mg/dL   Borderline  086-578  mg/dL   High  >469     mg/dL   Very High Performed at [facility], [address], [place], [place] [address]    Magnesium  10/19/2023 2.2  1.7 - 2.4 mg/dL Final   Performed at [facility], [address], [place], [place] [address]   POC Amphetamine UR 10/20/2023 None Detected  NONE DETECTED (Cut Off Level 1000 ng/mL) Final   POC Secobarbital (BAR) 10/20/2023 None Detected  NONE DETECTED (Cut Off Level 300 ng/mL) Final   POC Buprenorphine (BUP) 10/20/2023 None Detected  NONE DETECTED (Cut Off Level 10 ng/mL) Final   POC Oxazepam (BZO) 10/20/2023 None Detected  NONE DETECTED (Cut Off Level 300 ng/mL) Final   POC Cocaine UR 10/20/2023 None Detected  NONE DETECTED (Cut Off Level 300 ng/mL) Final   POC Methamphetamine UR 10/20/2023  None Detected  NONE DETECTED (Cut Off Level 1000 ng/mL) Final   POC Morphine 10/20/2023 None Detected  NONE DETECTED (Cut Off Level 300 ng/mL) Final   POC Methadone UR 10/20/2023 None Detected  NONE DETECTED (Cut Off Level 300 ng/mL) Final   POC Oxycodone UR 10/20/2023 None Detected  NONE DETECTED (Cut Off Level 100 ng/mL) Final   POC Marijuana UR 10/20/2023 None Detected  NONE DETECTED (Cut Off Level 50 ng/mL) Final   SARS Coronavirus 2 by RT PCR 10/20/2023 NEGATIVE  NEGATIVE Final   Performed at [facility], [address], [place], [place] [address]  Admission on 05/19/2023, Discharged on 05/19/2023  Component Date Value Ref Range Status   Glucose-Capillary 05/19/2023 76  70 - 99 mg/dL Final   Glucose reference range applies only to samples taken after fasting for at least 8 hours.  Admission on 05/19/2023, Discharged on 05/19/2023  Component Date Value Ref Range Status   WBC 05/19/2023 5.8  4.0 - 10.5 K/uL Final   RBC 05/19/2023 4.61  4.22 - 5.81 MIL/uL Final  Hemoglobin 05/19/2023 12.3 (L)  13.0 - 17.0 g/dL Final   HCT 16/04/9603 40.0  39.0 - 52.0 % Final   MCV 05/19/2023 86.8  80.0 - 100.0 fL Final   MCH 05/19/2023 26.7  26.0 - 34.0 pg Final   MCHC 05/19/2023 30.8  30.0 - 36.0 g/dL Final   RDW 54/03/8118 12.9  11.5 - 15.5 % Final   Platelets 05/19/2023 225  150 - 400 K/uL Final   nRBC 05/19/2023 0.0  0.0 - 0.2 % Final   Neutrophils Relative % 05/19/2023 57  % Final   Neutro Abs 05/19/2023 3.3  1.7 - 7.7 K/uL Final   Lymphocytes Relative 05/19/2023 26  % Final   Lymphs Abs 05/19/2023 1.5  0.7 - 4.0 K/uL Final   Monocytes Relative 05/19/2023 11  % Final   Monocytes Absolute 05/19/2023 0.6  0.1 - 1.0 K/uL Final   Eosinophils Relative 05/19/2023 5  % Final   Eosinophils Absolute 05/19/2023 0.3  0.0 - 0.5 K/uL Final   Basophils Relative 05/19/2023 1  % Final   Basophils Absolute 05/19/2023 0.1  0.0 - 0.1 K/uL Final   Immature Granulocytes 05/19/2023 0  % Final   Abs  Immature Granulocytes 05/19/2023 0.02  0.00 - 0.07 K/uL Final   Performed at [facility], [address], [place], [place] [address]   Sodium 05/19/2023 139  135 - 145 mmol/L Final   Potassium 05/19/2023 3.8  3.5 - 5.1 mmol/L Final   Chloride 05/19/2023 105  98 - 111 mmol/L Final   CO2 05/19/2023 28  22 - 32 mmol/L Final   Glucose, Bld 05/19/2023 86  70 - 99 mg/dL Final   Glucose reference range applies only to samples taken after fasting for at least 8 hours.   BUN 05/19/2023 13  6 - 20 mg/dL Final   Creatinine, Ser 05/19/2023 0.97  0.61 - 1.24 mg/dL Final   Calcium 95/62/1308 9.1  8.9 - 10.3 mg/dL Final   GFR, Estimated 05/19/2023 >60  >60 mL/min Final   Comment: (NOTE) Calculated using the CKD-EPI Creatinine Equation (2021)    Anion gap 05/19/2023 6  5 - 15 Final   Performed at [facility], [address] [place], [place] [address]   Troponin I (High Sensitivity) 05/19/2023 20 (H)  <18 ng/L Final   Comment: (NOTE) Elevated high sensitivity troponin I (hsTnI) values and significant  changes across serial measurements may suggest ACS but many other  chronic and acute conditions are known to elevate hsTnI results.  Refer to the "Links" section for chest pain algorithms and additional  guidance. Performed at [facility], [address] [place], [place] [address]    B Natriuretic Peptide 05/19/2023 9.5  0.0 - 100.0 pg/mL Final   Performed at [facility], [address] [place], [place] [address]   Troponin I (High Sensitivity) 05/19/2023 18 (H)  <18 ng/L Final   Comment: (NOTE) Elevated high sensitivity troponin I (hsTnI) values and significant  changes across serial measurements may suggest ACS but many other  chronic and acute conditions are known to elevate hsTnI results.  Refer to the "Links" section for chest pain algorithms and additional  guidance. Performed at [facility], [address] [place], [place] [address]   Admission on  05/17/2023, Discharged on 05/17/2023  Component Date Value Ref Range Status   Sodium 05/17/2023 137  135 - 145 mmol/L Final   Potassium 05/17/2023 3.5  3.5 -  5.1 mmol/L Final   Chloride 05/17/2023 105  98 - 111 mmol/L Final   CO2 05/17/2023 26  22 - 32 mmol/L Final   Glucose, Bld 05/17/2023 85  70 - 99 mg/dL Final   Glucose reference range applies only to samples taken after fasting for at least 8 hours.   BUN 05/17/2023 15  6 - 20 mg/dL Final   Creatinine, Ser 05/17/2023 1.29 (H)  0.61 - 1.24 mg/dL Final   Calcium 09/81/1914 8.7 (L)  8.9 - 10.3 mg/dL Final   Total Protein 78/29/5621 5.6 (L)  6.5 - 8.1 g/dL Final   Albumin 30/86/5784 3.1 (L)  3.5 - 5.0 g/dL Final   AST 69/62/9528 29  15 - 41 U/L Final   ALT 05/17/2023 27  0 - 44 U/L Final   Alkaline Phosphatase 05/17/2023 61  38 - 126 U/L Final   Total Bilirubin 05/17/2023 0.4  <1.2 mg/dL Final   GFR, Estimated 05/17/2023 >60  >60 mL/min Final   Comment: (NOTE) Calculated using the CKD-EPI Creatinine Equation (2021)    Anion gap 05/17/2023 6  5 - 15 Final   Performed at [facility], [address], [place], [place] [address]   Alcohol, Ethyl (B) 05/17/2023 <10  <10 mg/dL Final   Comment: (NOTE) Lowest detectable limit for serum alcohol is 10 mg/dL.  For medical purposes only. Performed at [facility], [address] [place], [place] [address]    WBC 05/17/2023 6.2  4.0 - 10.5 K/uL Final   RBC 05/17/2023 4.46  4.22 - 5.81 MIL/uL Final   Hemoglobin 05/17/2023 12.2 (L)  13.0 - 17.0 g/dL Final   HCT 72/53/6644 39.6  39.0 - 52.0 % Final   MCV 05/17/2023 88.8  80.0 - 100.0 fL Final   MCH 05/17/2023 27.4  26.0 - 34.0 pg Final   MCHC 05/17/2023 30.8  30.0 - 36.0 g/dL Final   RDW 03/47/4259 12.9  11.5 - 15.5 % Final   Platelets 05/17/2023 263  150 - 400 K/uL Final   nRBC 05/17/2023 0.0  0.0 - 0.2 % Final   Performed at [facility], [address], [place], [place] [address]  Admission on 05/15/2023, Discharged  on 05/16/2023  Component Date Value Ref Range Status   WBC 05/15/2023 7.5  4.0 - 10.5 K/uL Final   RBC 05/15/2023 4.41  4.22 - 5.81 MIL/uL Final   Hemoglobin 05/15/2023 12.3 (L)  13.0 - 17.0 g/dL Final   HCT 56/43/3295 39.2  39.0 - 52.0 % Final   MCV 05/15/2023 88.9  80.0 - 100.0 fL Final   MCH 05/15/2023 27.9  26.0 - 34.0 pg Final   MCHC 05/15/2023 31.4  30.0 - 36.0 g/dL Final   RDW 18/84/1660 12.8  11.5 - 15.5 % Final   Platelets 05/15/2023 273  150 - 400 K/uL Final   nRBC 05/15/2023 0.0  0.0 - 0.2 % Final   Neutrophils Relative % 05/15/2023 68  % Final   Neutro Abs 05/15/2023 5.1  1.7 - 7.7 K/uL Final   Lymphocytes Relative 05/15/2023 20  % Final   Lymphs Abs 05/15/2023 1.5  0.7 - 4.0 K/uL Final   Monocytes Relative 05/15/2023 9  % Final   Monocytes Absolute 05/15/2023 0.7  0.1 - 1.0 K/uL Final   Eosinophils Relative 05/15/2023 2  % Final   Eosinophils Absolute 05/15/2023 0.1  0.0 - 0.5 K/uL Final   Basophils Relative 05/15/2023 1  % Final   Basophils Absolute  05/15/2023 0.0  0.0 - 0.1 K/uL Final   Immature Granulocytes 05/15/2023 0  % Final   Abs Immature Granulocytes 05/15/2023 0.03  0.00 - 0.07 K/uL Final   Performed at [facility], [address] [place], [place] [address]   Alcohol, Ethyl (B) 05/15/2023 <10  <10 mg/dL Final   Comment: (NOTE) Lowest detectable limit for serum alcohol is 10 mg/dL.  For medical purposes only. Performed at [facility], [address], [place], [place] [address]    Sodium 05/15/2023 137  135 - 145 mmol/L Final   Potassium 05/15/2023 3.5  3.5 - 5.1 mmol/L Final   Chloride 05/15/2023 102  98 - 111 mmol/L Final   CO2 05/15/2023 27  22 - 32 mmol/L Final   Glucose, Bld 05/15/2023 89  70 - 99 mg/dL Final   Glucose reference range applies only to samples taken after fasting for at least 8 hours.   BUN 05/15/2023 8  6 - 20 mg/dL Final   Creatinine, Ser 05/15/2023 0.95  0.61 - 1.24 mg/dL Final   Calcium  09/81/1914 9.1  8.9 - 10.3 mg/dL Final   GFR, Estimated 05/15/2023 >60  >60 mL/min Final   Comment: (NOTE) Calculated using the CKD-EPI Creatinine Equation (2021)    Anion gap 05/15/2023 8  5 - 15 Final   Performed at [facility], [address], [place], [place] [address]   Acetaminophen  (Tylenol ), Serum 05/15/2023 <10 (L)  10 - 30 ug/mL Final   Comment: (NOTE) Therapeutic concentrations vary significantly. A range of 10-30 ug/mL  may be an effective concentration for many patients. However, some  are best treated at concentrations outside of this range. Acetaminophen  concentrations >150 ug/mL at 4 hours after ingestion  and >50 ug/mL at 12 hours after ingestion are often associated with  toxic reactions.  Performed at [facility], [address] [place], [place] [address]    Salicylate Lvl 05/15/2023 <7.0 (L)  7.0 - 30.0 mg/dL Final   Performed at [facility], [address] [place], [place] [address]  Admission on 05/13/2023, Discharged on 05/14/2023  Component Date Value Ref Range Status   WBC 05/13/2023 6.8  4.0 - 10.5 K/uL Final   RBC 05/13/2023 4.59  4.22 - 5.81 MIL/uL Final   Hemoglobin 05/13/2023 12.5 (L)  13.0 - 17.0 g/dL Final   HCT 46/96/2952 40.1  39.0 - 52.0 % Final   MCV 05/13/2023 87.4  80.0 - 100.0 fL Final   MCH 05/13/2023 27.2  26.0 - 34.0 pg Final   MCHC 05/13/2023 31.2  30.0 - 36.0 g/dL Final   RDW 84/13/2440 12.9  11.5 - 15.5 % Final   Platelets 05/13/2023 308  150 - 400 K/uL Final   nRBC 05/13/2023 0.0  0.0 - 0.2 % Final   Performed at [facility], [address], [place], [place] [address]   Sodium 05/13/2023 139  135 - 145 mmol/L Final   Potassium 05/13/2023 3.8  3.5 - 5.1 mmol/L Final   Chloride 05/13/2023 105  98 - 111 mmol/L Final   CO2 05/13/2023 24  22 - 32 mmol/L Final   Glucose, Bld 05/13/2023 108 (H)  70 - 99 mg/dL Final   Glucose reference range applies only to samples taken  after fasting for at least 8 hours.   BUN 05/13/2023 17  6 - 20 mg/dL Final   Creatinine, Ser 05/13/2023 1.02  0.61 - 1.24 mg/dL Final  Calcium 05/13/2023 9.2  8.9 - 10.3 mg/dL Final   GFR, Estimated 05/13/2023 >60  >60 mL/min Final   Comment: (NOTE) Calculated using the CKD-EPI Creatinine Equation (2021)    Anion gap 05/13/2023 10  5 - 15 Final   Performed at [facility], [address], [place], [place] [address]  Admission on 05/12/2023, Discharged on 05/13/2023  Component Date Value Ref Range Status   SARS Coronavirus 2 by RT PCR 05/13/2023 NEGATIVE  NEGATIVE Final   Comment: (NOTE) SARS-CoV-2 target nucleic acids are NOT DETECTED.  The SARS-CoV-2 RNA is generally detectable in upper respiratory specimens during the acute phase of infection. The lowest concentration of SARS-CoV-2 viral copies this assay can detect is 138 copies/mL. A negative result does not preclude SARS-Cov-2 infection and should not be used as the sole basis for treatment or other patient management decisions. A negative result may occur with  improper specimen collection/handling, submission of specimen other than nasopharyngeal swab, presence of viral mutation(s) within the areas targeted by this assay, and inadequate number of viral copies(<138 copies/mL). A negative result must be combined with clinical observations, patient history, and epidemiological information. The expected result is Negative.  Fact Sheet for Patients:  BloggerCourse.com  Fact Sheet for Healthcare Providers:  SeriousBroker.it  This test is no                          t yet approved or cleared by the [place]  FDA and  has been authorized for detection and/or diagnosis of SARS-CoV-2 by FDA under an Emergency Use Authorization (EUA). This EUA will remain  in effect (meaning this test can be used) for the duration of the COVID-19 declaration under Section 564(b)(1) of  the Act, 21 U.S.C.section 360bbb-3(b)(1), unless the authorization is terminated  or revoked sooner.       Influenza A by PCR 05/13/2023 NEGATIVE  NEGATIVE Final   Influenza B by PCR 05/13/2023 NEGATIVE  NEGATIVE Final   Comment: (NOTE) The Xpert Xpress SARS-CoV-2/FLU/RSV plus assay is intended as an aid in the diagnosis of influenza from Nasopharyngeal swab specimens and should not be used as a sole basis for treatment. Nasal washings and aspirates are unacceptable for Xpert Xpress SARS-CoV-2/FLU/RSV testing.  Fact Sheet for Patients: BloggerCourse.com  Fact Sheet for Healthcare Providers: SeriousBroker.it  This test is not yet approved or cleared by the [place]  FDA and has been authorized for detection and/or diagnosis of SARS-CoV-2 by FDA under an Emergency Use Authorization (EUA). This EUA will remain in effect (meaning this test can be used) for the duration of the COVID-19 declaration under Section 564(b)(1) of the Act, 21 U.S.C. section 360bbb-3(b)(1), unless the authorization is terminated or revoked.     Resp Syncytial Virus by PCR 05/13/2023 NEGATIVE  NEGATIVE Final   Comment: (NOTE) Fact Sheet for Patients: BloggerCourse.com  Fact Sheet for Healthcare Providers: SeriousBroker.it  This test is not yet approved or cleared by the [place]  FDA and has been authorized for detection and/or diagnosis of SARS-CoV-2 by FDA under an Emergency Use Authorization (EUA). This EUA will remain in effect (meaning this test can be used) for the duration of the COVID-19 declaration under Section 564(b)(1) of the Act, 21 U.S.C. section 360bbb-3(b)(1), unless the authorization is terminated or revoked.  Performed at [facility], [address], [place], [place] [address]   Admission on 05/04/2023, Discharged on 05/05/2023  Component Date Value Ref  Range Status  Sodium 05/04/2023 136  135 - 145 mmol/L Final   Potassium 05/04/2023 3.7  3.5 - 5.1 mmol/L Final   Chloride 05/04/2023 98  98 - 111 mmol/L Final   CO2 05/04/2023 28  22 - 32 mmol/L Final   Glucose, Bld 05/04/2023 110 (H)  70 - 99 mg/dL Final   Glucose reference range applies only to samples taken after fasting for at least 8 hours.   BUN 05/04/2023 18  6 - 20 mg/dL Final   Creatinine, Ser 05/04/2023 1.11  0.61 - 1.24 mg/dL Final   Calcium 16/04/9603 9.5  8.9 - 10.3 mg/dL Final   Total Protein 54/03/8118 6.1 (L)  6.5 - 8.1 g/dL Final   Albumin 14/78/2956 3.7  3.5 - 5.0 g/dL Final   AST 21/30/8657 59 (H)  15 - 41 U/L Final   ALT 05/04/2023 44  0 - 44 U/L Final   Alkaline Phosphatase 05/04/2023 55  38 - 126 U/L Final   Total Bilirubin 05/04/2023 0.6  0.3 - 1.2 mg/dL Final   GFR, Estimated 05/04/2023 >60  >60 mL/min Final   Comment: (NOTE) Calculated using the CKD-EPI Creatinine Equation (2021)    Anion gap 05/04/2023 10  5 - 15 Final   Performed at [facility], [address] [place], [place] [address]   Alcohol, Ethyl (B) 05/04/2023 <10  <10 mg/dL Final   Comment: (NOTE) Lowest detectable limit for serum alcohol is 10 mg/dL.  For medical purposes only. Performed at [facility], [address], [place], [place] [address]    WBC 05/04/2023 7.8  4.0 - 10.5 K/uL Final   RBC 05/04/2023 4.61  4.22 - 5.81 MIL/uL Final   Hemoglobin 05/04/2023 12.6 (L)  13.0 - 17.0 g/dL Final   HCT 41/32/4401 40.7  39.0 - 52.0 % Final   MCV 05/04/2023 88.3  80.0 - 100.0 fL Final   MCH 05/04/2023 27.3  26.0 - 34.0 pg Final   MCHC 05/04/2023 31.0  30.0 - 36.0 g/dL Final   RDW 02/72/5366 12.6  11.5 - 15.5 % Final   Platelets 05/04/2023 226  150 - 400 K/uL Final   nRBC 05/04/2023 0.0  0.0 - 0.2 % Final   Neutrophils Relative % 05/04/2023 65  % Final   Neutro Abs 05/04/2023 5.1  1.7 - 7.7 K/uL Final   Lymphocytes Relative 05/04/2023 19  % Final    Lymphs Abs 05/04/2023 1.5  0.7 - 4.0 K/uL Final   Monocytes Relative 05/04/2023 8  % Final   Monocytes Absolute 05/04/2023 0.6  0.1 - 1.0 K/uL Final   Eosinophils Relative 05/04/2023 6  % Final   Eosinophils Absolute 05/04/2023 0.5  0.0 - 0.5 K/uL Final   Basophils Relative 05/04/2023 1  % Final   Basophils Absolute 05/04/2023 0.1  0.0 - 0.1 K/uL Final   Immature Granulocytes 05/04/2023 1  % Final   Abs Immature Granulocytes 05/04/2023 0.04  0.00 - 0.07 K/uL Final   Performed at [facility], [address] [place], [place] [address]  Admission on 05/03/2023, Discharged on 05/04/2023  Component Date Value Ref Range Status   SARS Coronavirus 2 by RT PCR 05/03/2023 NEGATIVE  NEGATIVE Final   Performed at [facility], [address], [place], [place] [address]  Admission on 04/29/2023, Discharged on 04/29/2023  Component Date Value Ref Range Status   Sodium 04/29/2023 140  135 - 145 mmol/L Final   Potassium 04/29/2023 3.4 (L)  3.5 - 5.1  mmol/L Final   Chloride 04/29/2023 101  98 - 111 mmol/L Final   CO2 04/29/2023 28  22 - 32 mmol/L Final   Glucose, Bld 04/29/2023 88  70 - 99 mg/dL Final   Glucose reference range applies only to samples taken after fasting for at least 8 hours.   BUN 04/29/2023 20  6 - 20 mg/dL Final   Creatinine, Ser 04/29/2023 1.09  0.61 - 1.24 mg/dL Final   Calcium 61/95/0932 9.5  8.9 - 10.3 mg/dL Final   GFR, Estimated 04/29/2023 >60  >60 mL/min Final   Comment: (NOTE) Calculated using the CKD-EPI Creatinine Equation (2021)    Anion gap 04/29/2023 11  5 - 15 Final   Performed at [facility], [address] [place], [place] [address]   WBC 04/29/2023 9.9  4.0 - 10.5 K/uL Final   RBC 04/29/2023 4.99  4.22 - 5.81 MIL/uL Final   Hemoglobin 04/29/2023 13.7  13.0 - 17.0 g/dL Final   HCT 58/03/9832 42.9  39.0 - 52.0 % Final   MCV 04/29/2023 86.0  80.0 - 100.0 fL Final   MCH 04/29/2023 27.5  26.0 - 34.0 pg Final   MCHC  04/29/2023 31.9  30.0 - 36.0 g/dL Final   RDW 82/50/5397 12.6  11.5 - 15.5 % Final   Platelets 04/29/2023 219  150 - 400 K/uL Final   nRBC 04/29/2023 0.0  0.0 - 0.2 % Final   Performed at [facility], [address] [place], [place] [address]  There may be more visits with results that are not included.   Blood Alcohol level:  Lab Results  Component Value Date   ETH <10 05/17/2023   ETH <10 05/15/2023   Metabolic Disorder Labs: No results found for: "HGBA1C", "MPG" No results found for: "PROLACTIN" Lab Results  Component Value Date   CHOL 152 10/19/2023   TRIG 144 10/19/2023   HDL 57 10/19/2023   CHOLHDL 2.7 10/19/2023   VLDL 29 10/19/2023   LDLCALC 66 10/19/2023   Therapeutic Lab Levels: No results found for: "LITHIUM" No results found for: "VALPROATE" No results found for: "CBMZ"  Physical Findings   Flowsheet Row ED from 10/24/2023 in [facility] ED from 10/19/2023 in [facility] ED from 05/20/2023 in [facility] Emergency Department at [facility]  C-SSRS RISK CATEGORY No Risk High Risk No Risk      Musculoskeletal  Strength & Muscle Tone: within normal limits Gait & Station: normal Patient leans: N/A  Psychiatric Specialty Exam  Presentation  General Appearance:  Disheveled  Eye Contact: Other (comment) (Bizarre)  Speech: Normal Rate  Speech Volume: Normal  Handedness: Right  Mood and Affect  Mood: Anxious  Affect: Congruent  Thought Process  Thought Processes: Disorganized  Descriptions of Associations:Tangential  Orientation:Partial  Thought Content:Illogical; Tangential  Diagnosis of Schizophrenia or Schizoaffective disorder in past: Yes  Duration of Psychotic Symptoms: Greater than six months   Hallucinations:Hallucinations: None  Ideas of Reference:None  Suicidal Thoughts:Suicidal Thoughts: No  Homicidal Thoughts:Homicidal Thoughts:  No  Sensorium  Memory: Immediate Poor  Judgment: Poor  Insight: Lacking  Executive Functions  Concentration: Poor  Attention Span: Poor  Recall: Poor  Fund of Knowledge: Poor  Language: Poor  Psychomotor Activity  Psychomotor Activity: Psychomotor Activity: Mannerisms  Assets  Assets: Desire for Improvement  Sleep  Sleep: Sleep: Poor  Nutritional Assessment (For OBS and FBC admissions only) Has the patient had a weight loss or  gain of 10 pounds or more in the last 3 months?: No Has the patient had a decrease in food intake/or appetite?: No Does the patient have dental problems?: No Does the patient have eating habits or behaviors that may be indicators of an eating disorder including binging or inducing vomiting?: No Has the patient recently lost weight without trying?: 0 Has the patient been eating poorly because of a decreased appetite?: 0 Malnutrition Screening Tool Score: 0   Physical Exam  Physical Exam Vitals and nursing note reviewed.  Constitutional:      Appearance: He is normal weight.  HENT:     Head: Normocephalic.     Right Ear: External ear normal.     Left Ear: External ear normal.     Nose: Nose normal.     Mouth/Throat:     Mouth: Mucous membranes are moist.  Eyes:     Extraocular Movements: Extraocular movements intact.  Cardiovascular:     Rate and Rhythm: Normal rate.     Pulses: Normal pulses.  Pulmonary:     Effort: Pulmonary effort is normal.  Abdominal:     Comments: Deferred  Genitourinary:    Comments: Deferred  Musculoskeletal:        General: Normal range of motion.     Cervical back: Normal range of motion.  Skin:    General: Skin is warm.  Neurological:     General: No focal deficit present.     Mental Status: He is alert and oriented to person, place, and time.  Psychiatric:        Mood and Affect: Mood normal.        Behavior: Behavior normal.    Review of Systems  Constitutional:  Negative for  chills and fever.  HENT:  Negative for sore throat.   Eyes:  Negative for blurred vision.  Respiratory:  Negative for cough, sputum production, shortness of breath and wheezing.   Cardiovascular:  Negative for chest pain and palpitations.  Gastrointestinal:  Negative for heartburn and nausea.  Genitourinary:  Negative for dysuria, frequency and urgency.  Musculoskeletal:  Negative for myalgias.  Skin:  Negative for rash.  Neurological:  Negative for dizziness and headaches.  Endo/Heme/Allergies:        See allergy listing   Blood pressure 109/68, pulse 92, temperature 99.3 F (37.4 C), temperature source Oral, resp. rate 18, SpO2 100%. There is no height or weight on file to calculate BMI.  Treatment Plan Summary: Daily contact with patient to assess and evaluate symptoms and progress in treatment and Medication management  Treatment Plan: See [facility]  Laurence Pons, FNP 10/24/2023 3:26 PM

## 2023-10-25 ENCOUNTER — Encounter (HOSPITAL_COMMUNITY): Payer: Self-pay | Admitting: Psychiatry

## 2023-10-25 ENCOUNTER — Other Ambulatory Visit: Payer: Self-pay

## 2023-10-25 ENCOUNTER — Inpatient Hospital Stay (HOSPITAL_COMMUNITY)
Admission: AD | Admit: 2023-10-25 | Discharge: 2023-10-31 | DRG: 885 | Disposition: A | Discharge status: Home / Self Care | Source: Intra-hospital | Attending: Psychiatry | Admitting: Psychiatry

## 2023-10-25 DIAGNOSIS — G4700 Insomnia, unspecified: Secondary | ICD-10-CM | POA: Diagnosis present

## 2023-10-25 DIAGNOSIS — F200 Paranoid schizophrenia: Secondary | ICD-10-CM | POA: Diagnosis present

## 2023-10-25 DIAGNOSIS — F17210 Nicotine dependence, cigarettes, uncomplicated: Secondary | ICD-10-CM | POA: Diagnosis present

## 2023-10-25 DIAGNOSIS — I10 Essential (primary) hypertension: Secondary | ICD-10-CM | POA: Diagnosis present

## 2023-10-25 DIAGNOSIS — Z5900 Homelessness unspecified: Secondary | ICD-10-CM

## 2023-10-25 DIAGNOSIS — F4323 Adjustment disorder with mixed anxiety and depressed mood: Secondary | ICD-10-CM | POA: Diagnosis present

## 2023-10-25 DIAGNOSIS — F1490 Cocaine use, unspecified, uncomplicated: Secondary | ICD-10-CM | POA: Diagnosis present

## 2023-10-25 DIAGNOSIS — R45851 Suicidal ideations: Secondary | ICD-10-CM | POA: Diagnosis present

## 2023-10-25 DIAGNOSIS — F203 Undifferentiated schizophrenia: Principal | ICD-10-CM | POA: Insufficient documentation

## 2023-10-25 DIAGNOSIS — F19959 Other psychoactive substance use, unspecified with psychoactive substance-induced psychotic disorder, unspecified: Secondary | ICD-10-CM | POA: Diagnosis present

## 2023-10-25 DIAGNOSIS — Z79899 Other long term (current) drug therapy: Secondary | ICD-10-CM

## 2023-10-25 DIAGNOSIS — Z91199 Patient's noncompliance with other medical treatment and regimen due to unspecified reason: Secondary | ICD-10-CM

## 2023-10-25 DIAGNOSIS — Z5941 Food insecurity: Secondary | ICD-10-CM

## 2023-10-25 MED ORDER — DIPHENHYDRAMINE HCL 50 MG/ML IJ SOLN: 50.0000 mg | Freq: Three times a day (TID) | INTRAMUSCULAR | Status: DC | PRN

## 2023-10-25 MED ORDER — ALUM & MAG HYDROXIDE-SIMETH 200-200-20 MG/5ML PO SUSP: 30.0000 mL | ORAL | Status: DC | PRN

## 2023-10-25 MED ORDER — MIRTAZAPINE 15 MG PO TABS
15.0000 mg | ORAL_TABLET | Freq: Every day | ORAL | Status: DC
  Administered 2023-10-25: 15 mg via ORAL
  Filled 2023-10-25 (×4): qty 1

## 2023-10-25 MED ORDER — TRAZODONE HCL 50 MG PO TABS
50.0000 mg | ORAL_TABLET | Freq: Every evening | ORAL | Status: DC | PRN
  Administered 2023-10-27 – 2023-10-30 (×4): 50 mg via ORAL
  Filled 2023-10-25: qty 14
  Filled 2023-10-25 (×5): qty 1

## 2023-10-25 MED ORDER — DIPHENHYDRAMINE HCL 25 MG PO CAPS: 50.0000 mg | ORAL_CAPSULE | Freq: Three times a day (TID) | ORAL | Status: DC | PRN

## 2023-10-25 MED ORDER — ACETAMINOPHEN 325 MG PO TABS: 650.0000 mg | ORAL_TABLET | Freq: Four times a day (QID) | ORAL | Status: DC | PRN

## 2023-10-25 MED ORDER — HALOPERIDOL LACTATE 5 MG/ML IJ SOLN: 5.0000 mg | Freq: Three times a day (TID) | INTRAMUSCULAR | Status: DC | PRN

## 2023-10-25 MED ORDER — QUETIAPINE FUMARATE 25 MG PO TABS
25.0000 mg | ORAL_TABLET | Freq: Two times a day (BID) | ORAL | Status: DC
  Administered 2023-10-25 – 2023-10-31 (×12): 25 mg via ORAL
  Filled 2023-10-25 (×7): qty 1
  Filled 2023-10-25: qty 28
  Filled 2023-10-25 (×6): qty 1
  Filled 2023-10-25: qty 28
  Filled 2023-10-25 (×3): qty 1

## 2023-10-25 MED ORDER — HYDROXYZINE HCL 25 MG PO TABS
25.0000 mg | ORAL_TABLET | Freq: Three times a day (TID) | ORAL | Status: DC | PRN
  Administered 2023-10-25 – 2023-10-30 (×3): 25 mg via ORAL
  Filled 2023-10-25 (×2): qty 1
  Filled 2023-10-25: qty 20
  Filled 2023-10-25: qty 1

## 2023-10-25 MED ORDER — MAGNESIUM HYDROXIDE 400 MG/5ML PO SUSP: 30.0000 mL | Freq: Every day | ORAL | Status: DC | PRN

## 2023-10-25 MED ORDER — LORAZEPAM 2 MG/ML IJ SOLN: 2.0000 mg | Freq: Three times a day (TID) | INTRAMUSCULAR | Status: DC | PRN

## 2023-10-25 MED ORDER — HALOPERIDOL LACTATE 5 MG/ML IJ SOLN: 10.0000 mg | Freq: Three times a day (TID) | INTRAMUSCULAR | Status: DC | PRN

## 2023-10-25 MED ORDER — HALOPERIDOL 5 MG PO TABS: 5.0000 mg | ORAL_TABLET | Freq: Three times a day (TID) | ORAL | Status: DC | PRN

## 2023-10-25 NOTE — ED Provider Notes (Signed)
 FBC/OBS ASAP Discharge Summary  Date and Time: 10/25/2023 11:43 AM  Name: Aaron Rubio  MRN:  [id number]   Discharge Diagnoses:  Final diagnoses:  Disorganized schizophrenia (HCC)  Homelessness  Non compliance w medication regimen   Subjective: Aaron Rubio is a 43 year old male with psychiatric history of disorganized schizophrenia, who presented voluntarily as a walk in to [facility] with complaints of homeless and SI.   Stay Summary: Patient presented as a walk-in on 10/19/2023 to [facility] behavioral health urgent care with prior psychiatric diagnoses significant for disorganized schizophrenia, homelessness, noncompliance to medication regimen, for worsening suicidal ideations.  Patient was admitted for observation and treated with Seroquel  and Remeron  for mood stabilization.  Patient continued to be evaluated for safety every 15 minutes until appropriate placement at the inpatient psychiatric hospital becomes available.  Total Time spent with patient: 45 minutes  Past Psychiatric History: See H&P Past Medical History: See H&P Family History: See H&P Family Psychiatric History: See NP Social History: See H Tobacco Cessation:  N/A, patient does not currently use tobacco products  Current Medications:  Current Facility-Administered Medications  Medication Dose Route Frequency Provider Last Rate Last Admin   acetaminophen  (TYLENOL ) tablet 650 mg  650 mg Oral Q6H PRN Onuoha, Chinwendu V, NP       alum & mag hydroxide-simeth (MAALOX/MYLANTA) 200-200-20 MG/5ML suspension 30 mL  30 mL Oral Q4H PRN Onuoha, Chinwendu V, NP       haloperidol  (HALDOL ) tablet 5 mg  5 mg Oral TID PRN Onuoha, Chinwendu V, NP   5 mg at 10/24/23 0554   And   diphenhydrAMINE  (BENADRYL ) capsule 50 mg  50 mg Oral TID PRN Onuoha, Chinwendu V, NP       haloperidol  lactate (HALDOL ) injection 5 mg  5 mg Intramuscular TID PRN Onuoha, Chinwendu V, NP       And   diphenhydrAMINE  (BENADRYL ) injection 50 mg  50  mg Intramuscular TID PRN Onuoha, Chinwendu V, NP       And   LORazepam  (ATIVAN ) injection 2 mg  2 mg Intramuscular TID PRN Onuoha, Chinwendu V, NP       haloperidol  lactate (HALDOL ) injection 10 mg  10 mg Intramuscular TID PRN Onuoha, Chinwendu V, NP       And   diphenhydrAMINE  (BENADRYL ) injection 50 mg  50 mg Intramuscular TID PRN Onuoha, Chinwendu V, NP       And   LORazepam  (ATIVAN ) injection 2 mg  2 mg Intramuscular TID PRN Onuoha, Chinwendu V, NP       hydrOXYzine  (ATARAX ) tablet 25 mg  25 mg Oral TID PRN Onuoha, Chinwendu V, NP       magnesium  hydroxide (MILK OF MAGNESIA) suspension 30 mL  30 mL Oral Daily PRN Onuoha, Chinwendu V, NP       mirtazapine  (REMERON ) tablet 15 mg  15 mg Oral QHS Onuoha, Chinwendu V, NP   15 mg at 10/24/23 2116   QUEtiapine  (SEROQUEL ) tablet 25 mg  25 mg Oral BID Onuoha, Chinwendu V, NP   25 mg at 10/25/23 0940   traZODone  (DESYREL ) tablet 50 mg  50 mg Oral QHS PRN Onuoha, Chinwendu V, NP       Current Outpatient Medications  Medication Sig Dispense Refill   mirtazapine  (REMERON ) 15 MG tablet Take 1 tablet (15 mg total) by mouth at bedtime. 14 tablet 0   QUEtiapine  (SEROQUEL ) 25 MG tablet Take 1 tablet (25 mg total) by mouth 2 (two) times daily. 28  tablet 0    PTA Medications:  Facility Ordered Medications  Medication   acetaminophen  (TYLENOL ) tablet 650 mg   alum & mag hydroxide-simeth (MAALOX/MYLANTA) 200-200-20 MG/5ML suspension 30 mL   magnesium  hydroxide (MILK OF MAGNESIA) suspension 30 mL   haloperidol  (HALDOL ) tablet 5 mg   And   diphenhydrAMINE  (BENADRYL ) capsule 50 mg   haloperidol  lactate (HALDOL ) injection 5 mg   And   diphenhydrAMINE  (BENADRYL ) injection 50 mg   And   LORazepam  (ATIVAN ) injection 2 mg   haloperidol  lactate (HALDOL ) injection 10 mg   And   diphenhydrAMINE  (BENADRYL ) injection 50 mg   And   LORazepam  (ATIVAN ) injection 2 mg   hydrOXYzine  (ATARAX ) tablet 25 mg   traZODone  (DESYREL ) tablet 50 mg   QUEtiapine   (SEROQUEL ) tablet 25 mg   mirtazapine  (REMERON ) tablet 15 mg   PTA Medications  Medication Sig   mirtazapine  (REMERON ) 15 MG tablet Take 1 tablet (15 mg total) by mouth at bedtime.   QUEtiapine  (SEROQUEL ) 25 MG tablet Take 1 tablet (25 mg total) by mouth 2 (two) times daily.        No data to display          Flowsheet Row ED from 10/24/2023 in [facility] ED from 10/19/2023 in [facility] ED from 05/20/2023 in [facility] Emergency Department at [facility]  C-SSRS RISK CATEGORY No Risk High Risk No Risk       Musculoskeletal  Strength & Muscle Tone: within normal limits Gait & Station: normal Patient leans: N/A  Psychiatric Specialty Exam  Presentation  General Appearance:  Disheveled  Eye Contact: Fair  Speech: Clear and Coherent  Speech Volume: Normal  Handedness: Right  Mood and Affect  Mood: Anxious; Depressed; Dysphoric  Affect: Congruent; Constricted  Thought Process  Thought Processes: Disorganized (Patient reporst "I cannot think for myself")  Descriptions of Associations:Tangential  Orientation:Partial  Thought Content:Illogical  Diagnosis of Schizophrenia or Schizoaffective disorder in past: Yes  Duration of Psychotic Symptoms: Greater than six months   Hallucinations:Hallucinations: None Description of Visual Hallucinations: Patient reports, "seeing cars driving back and forth"  Ideas of Reference:None  Suicidal Thoughts:Suicidal Thoughts: No SI Passive Intent and/or Plan: -- (Denies)  Homicidal Thoughts:Homicidal Thoughts: No  Sensorium  Memory: Immediate Poor; Recent Poor (Patient reports," I cannot think for myself")  Judgment: Poor  Insight: Poor  Executive Functions  Concentration: Fair  Attention Span: Fair  Recall: Poor  Fund of Knowledge: Poor  Language: Fair  Psychomotor Activity  Psychomotor Activity: Psychomotor Activity:  Normal  Assets  Assets: Physical Health; Resilience  Sleep  Sleep: Sleep: Good Number of Hours of Sleep: 8  Nutritional Assessment (For OBS and FBC admissions only) Has the patient had a weight loss or gain of 10 pounds or more in the last 3 months?: No Has the patient had a decrease in food intake/or appetite?: No Does the patient have dental problems?: No Does the patient have eating habits or behaviors that may be indicators of an eating disorder including binging or inducing vomiting?: No Has the patient recently lost weight without trying?: 0 Has the patient been eating poorly because of a decreased appetite?: 0 Malnutrition Screening Tool Score: 0   Physical Exam  Physical Exam Vitals and nursing note reviewed.  Constitutional:      General: He is not in acute distress.    Appearance: He is not ill-appearing or toxic-appearing.  HENT:     Head: Normocephalic.  Right Ear: External ear normal.     Left Ear: External ear normal.     Nose: Nose normal.     Mouth/Throat:     Mouth: Mucous membranes are moist.     Pharynx: Oropharynx is clear.  Eyes:     Extraocular Movements: Extraocular movements intact.  Cardiovascular:     Rate and Rhythm: Normal rate.     Pulses: Normal pulses.  Pulmonary:     Effort: Pulmonary effort is normal.  Abdominal:     Comments: Deferred  Genitourinary:    Comments: Deferred Musculoskeletal:        General: Normal range of motion.     Cervical back: Normal range of motion.  Skin:    General: Skin is warm.  Neurological:     General: No focal deficit present.     Mental Status: He is alert and oriented to person, place, and time.  Psychiatric:        Mood and Affect: Mood normal.        Behavior: Behavior normal.    Review of Systems  Constitutional:  Negative for chills and fever.  HENT:  Negative for sore throat.   Eyes:  Negative for blurred vision.  Respiratory:  Negative for cough, sputum production, shortness of  breath and wheezing.   Cardiovascular:  Negative for chest pain and palpitations.  Gastrointestinal:  Negative for heartburn, nausea and vomiting.  Genitourinary:  Negative for dysuria.  Musculoskeletal:  Negative for myalgias.  Skin:  Negative for itching and rash.  Neurological:  Negative for dizziness and headaches.  Endo/Heme/Allergies:        See allergy listing  Psychiatric/Behavioral:  Positive for depression. Negative for hallucinations, substance abuse and suicidal ideas. The patient is nervous/anxious. The patient does not have insomnia.    Blood pressure (!) 126/94, pulse 78, temperature 98.1 F (36.7 C), temperature source Oral, resp. rate 18, SpO2 99%. There is no height or weight on file to calculate BMI.  Demographic Factors:  Male, Low socioeconomic status, and Unemployed  Loss Factors: Financial problems/change in socioeconomic status  Historical Factors: NA  Risk Reduction Factors:   NA  Continued Clinical Symptoms:  Depression:   Anhedonia Delusional Severe Schizophrenia:   Command hallucinatons Paranoid or undifferentiated type More than one psychiatric diagnosis Previous Psychiatric Diagnoses and Treatments Medical Diagnoses and Treatments/Surgeries  Cognitive Features That Contribute To Risk:  Polarized thinking    Suicide Risk:  Severe:  Frequent, intense, and enduring suicidal ideation, specific plan, no subjective intent, but some objective markers of intent (i.e., choice of lethal method), the method is accessible, some limited preparatory behavior, evidence of impaired self-control, severe dysphoria/symptomatology, multiple risk factors present, and few if any protective factors, particularly a lack of social support.  Plan Of Care/Follow-up recommendations:  Discharge Recommendations:  The patient is being discharged to [facility]. Patient is to take his discharge medications as ordered.  See follow up above. We  recommend that he participates in individual therapy to target uncontrollable agitation and substance abuse.  We recommend that he participates in therapy to improve communication skills and conflict resolution skills.  We recommend that he get AIMS scale, height, weight, blood pressure, fasting lipid panel, fasting blood sugar in three months from discharge as he's on atypical antipsychotics.  Patient will benefit from monitoring of recurrent suicidal ideation since patient is on antidepressant medication. The patient should abstain from all illicit substances and alcohol.  If the patient's symptoms worsen or  do not continue to improve or if the patient becomes actively suicidal or homicidal then it is recommended that the patient return to the closest hospital emergency room or call 911 for further evaluation and treatment. National Suicide Prevention Lifeline 1800-SUICIDE or [phone]) 076-9853. Please follow up with your primary medical doctor for all other medical needs.  The patient has been educated on the possible side effects to medications and he is to contact a medical professional and inform outpatient provider of any new side effects of medication. He is to take regular diet and activity as tolerated.  Will benefit from moderate daily exercise. Patient was educated about removing/locking any firearms, medications or dangerous products from the home. Activity:  As tolerated Diet:  Regular  Disposition: Discharged to [facility]  Dorinda Garland [place], [place] 10/25/2023, 11:43 AM

## 2023-10-25 NOTE — [facility] Group Notes (Signed)
Patient did not attend the Wrap-up group. 

## 2023-10-25 NOTE — Plan of Care (Signed)
   Problem: Activity: Goal: Sleeping patterns will improve Outcome: Progressing   Problem: Safety: Goal: Periods of time without injury will increase Outcome: Progressing

## 2023-10-25 NOTE — ED Notes (Signed)
 Patient discharged to [facility]. Transported by [employer].

## 2023-10-25 NOTE — Progress Notes (Signed)
   10/25/23 1600  Psych Admission Type (Psych Patients Only)  Admission Status Voluntary  Psychosocial Assessment  Patient Complaints Depression  Eye Contact Fair  Facial Expression Flat  Affect Flat  Speech Logical/coherent  Interaction Poor  Motor Activity Slow  Appearance/Hygiene In scrubs  Behavior Characteristics Cooperative;Appropriate to situation  Mood Pleasant  Thought Process  Coherency WDL  Content WDL  Delusions None reported or observed  Perception WDL  Hallucination None reported or observed  Judgment Impaired  Confusion None  Danger to Self  Current suicidal ideation? Denies  Agreement Not to Harm Self Yes  Description of Agreement verbal  Danger to Others  Danger to Others None reported or observed

## 2023-10-25 NOTE — ED Notes (Signed)
 Patient A&Ox4. He has been calm, cooperative, and appropriate with peers. He endorses passive SI with no plan and intermittent auditor hallucinations.Denies. Patient denies any physical pain or discomfort. No acute distress observed. Routine safety checks conducted according to facility protocol. Patient agreed to notify staff should thoughts of harm toward self or others arise. We will continue to monitor for safety.

## 2023-10-25 NOTE — ED Notes (Signed)
 Call placed X 2 to give report. Will try again.

## 2023-10-25 NOTE — Plan of Care (Signed)
   Problem: Education: Goal: Knowledge of [facility] Education information/materials will improve Outcome: Progressing Goal: Emotional status will improve Outcome: Progressing Goal: Mental status will improve Outcome: Progressing Goal: Verbalization of understanding the information provided will improve Outcome: Progressing   Problem: Activity: Goal: Interest or engagement in activities will improve Outcome: Progressing Goal: Sleeping patterns will improve Outcome: Progressing   Problem: Coping: Goal: Ability to verbalize frustrations and anger appropriately will improve Outcome: Progressing

## 2023-10-25 NOTE — ED Notes (Signed)
 Report given to Alexa, RN. [facility] called to transport pt.

## 2023-10-25 NOTE — Progress Notes (Signed)
   10/25/23 2215  Psych Admission Type (Psych Patients Only)  Admission Status Voluntary  Psychosocial Assessment  Patient Complaints Suspiciousness  Eye Contact Avertive  Facial Expression Flat  Affect Flat  Speech Logical/coherent  Interaction Forwards little  Motor Activity Slow  Appearance/Hygiene In scrubs  Behavior Characteristics Appropriate to situation  Mood Suspicious;Preoccupied  Aggressive Behavior  Effect No apparent injury  Thought Process  Coherency WDL  Content WDL  Delusions WDL  Perception WDL  Hallucination None reported or observed  Judgment WDL  Confusion WDL  Danger to Self  Current suicidal ideation? Denies  Danger to Others  Danger to Others None reported or observed

## 2023-10-26 ENCOUNTER — Encounter (HOSPITAL_COMMUNITY): Payer: Self-pay

## 2023-10-26 DIAGNOSIS — F203 Undifferentiated schizophrenia: Principal | ICD-10-CM | POA: Insufficient documentation

## 2023-10-26 DIAGNOSIS — F4323 Adjustment disorder with mixed anxiety and depressed mood: Secondary | ICD-10-CM | POA: Diagnosis present

## 2023-10-26 MED ORDER — QUETIAPINE FUMARATE 100 MG PO TABS
100.0000 mg | ORAL_TABLET | Freq: Every day | ORAL | Status: DC
  Administered 2023-10-26 – 2023-10-27 (×2): 100 mg via ORAL
  Filled 2023-10-26 (×3): qty 1

## 2023-10-26 MED ORDER — FLUOXETINE HCL 10 MG PO CAPS
10.0000 mg | ORAL_CAPSULE | Freq: Every day | ORAL | Status: DC
Start: 2023-10-26 — End: 2023-10-31
  Administered 2023-10-26 – 2023-10-31 (×6): 10 mg via ORAL
  Filled 2023-10-26 (×4): qty 1
  Filled 2023-10-26: qty 14
  Filled 2023-10-26 (×4): qty 1

## 2023-10-26 NOTE — [facility] Group Notes (Signed)
 Psychoeducational Group Note  Date:  10/26/2023 Time:  2000  Group Topic/Focus:  Alcoholics Anonymous Meeting  Participation Level: Did Not Attend  Participation Quality:  Not Applicable  Affect:  Not Applicable  Cognitive:  Not Applicable  Insight:  Not Applicable  Engagement in Group: Not Applicable  Additional Comments:  Did not attend.  Catharine Clock 10/26/2023, 10:25 PM

## 2023-10-26 NOTE — Progress Notes (Signed)
   10/26/23 1650  Spiritual Encounters  Type of Visit Initial  Care provided to: Patient  Referral source Clinical staff   I met briefly with Mr. Aaron Rubio to offer spiritual care support.  [name] appreciated the visit but was reserved. I established a relationship of care and let him know that I would follow up at earliest opportunity if desired. I offered encouragement and let him know support remains available; staff may page chaplain over weekend at number below.  Amyla Heffner L. Minetta Aly, M.Div [phone]

## 2023-10-26 NOTE — Plan of Care (Signed)
   Problem: Activity: Goal: Interest or engagement in activities will improve Outcome: Progressing   Problem: Coping: Goal: Ability to verbalize frustrations and anger appropriately will improve Outcome: Progressing   Problem: Safety: Goal: Periods of time without injury will increase Outcome: Progressing

## 2023-10-26 NOTE — H&P (Signed)
 Psychiatric Admission Assessment Adult  Patient Identification: Aaron Rubio  MRN:  [id number]  Date of Evaluation:  10/26/2023  Chief Complaint: Suicidal ideations & auditory hallucinations.  Principal Diagnosis: Undifferentiated schizophrenia (HCC)  Diagnosis:  Principal Problem:   Undifferentiated schizophrenia (HCC) Active Problems:   Adjustment disorder with mixed anxiety and depressed mood  History of Present Illness: This is the first psychiatric admission in this [facility] for this 43 year old African-American male with probable history of mental health issues/substance abuse issues as well. Patient was admitted to the [facility] from the [facility] with complaints of auditory hallucinations and suicidal ideations. He was admitted at the observational unit at [facility] where he was observed and treated for 4 days prior to his arrival to [facility] for further psychiatric evaluation/treatments. Patient current lab results has been reviewed.  His liver enzymes, both (AST & ALT) were elevated.  However, there were no results for BAL. His UDS was positive for cocaine, although patient denies any drug use including alcohol.  His answers to the assessment questions seem a bit evaded/guarded. During this evaluation, patient reports,   "I walked to the [facility] yesterday by myself. I have been going through depression since coming out of penitentiary last year, 2024.  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 I have been struggling to adjust to life outside the prison system since coming out of prison last year. I have people that are supportive, I have run them off because I did not know how to talk to people anymore.  I just was not communicative like I should.  That is what prison does to you or anyone that has been there. I have siblings, but they are living in [place]  area. I live here in [place] .  When I was in prison, I was taking medications, Haldol  & Cogentin  for hearing voices and talking to myself.  I also had mood swings while in prison.  I was diagnosed with schizophrenia in while prison in 2015.  The reason was because I was talking to myself, seeing stuff and hearing voices. The Haldol  in prison with Cogentin  did not help me with my mood or other symptoms I was having at the time. That is why they switched me to Seroquel  & BuSpar . The Seroquel  I was taking in the morning & at nighttime because it does help me with my mood & sleep. But I did not see any need use for the BuSpar  because it was not helping, so I stopped taking it. I was also on mirtazapine  which helped me to sleep as well. I was also on Percocet because the lower part of my back is always hurting.  I have been depressed off and on many years. I do not use any drugs or substances including alcohol. I need to get back on my Seroquel  and I want you guess to be patient with me so that you can figure out what exactly is going on with me & help.  Objective: Aaron Rubio presents alert, oriented x 3 & away of situation.  He presents with a restricted/flat affect. He is making a fair eye contact. Chart review reports indicated that patient may have been at the [facility] for over 5 days prior to being transferred to the [facility]. However, during this evaluation, he reports that he just walked up to the [facility] yesterday.  Although complaining of psychiatric symptoms, patient's admission here may be to his own secondary gain. Chart review also indicated that the patient is  homeless. It is very unsure whether patient lives in [place]  or passing by. His answers to the assessment questions seem vague. He currently denies any suicidal or homicidal ideations, AVH, delusional thoughts or paranoia. He does not appear to be responding to any internal stimuli. His UDS was positive for UDS. This case is discussed with the attending psychiatrist.  The treatment team has decided to start patient on Seroquel  100 mg at nighttime, he was already on Seroquel  25 mg twice daily for  agitation.  Because he is complaining of depression, we added Prozac  10 mg to his plan of care to manage his depressive symptoms.  At this time, patient is unable to explain the reason his liver enzymes were elevated. He denies any other symptoms or concerns. Will recheck his CMP. Will also obtain HGBa1c, Urinalysis & TSH.  Associated Signs/Symptoms:  Depression Symptoms:  depressed mood, insomnia, hopelessness, anxiety,  (Hypo) Manic Symptoms:  Irritable Mood,  Anxiety Symptoms:  Excessive Worry,  Psychotic Symptoms:   Patient reports hx of AVH, but denies any AVH during this evaluation.  PTSD Symptoms: Denies any PTSD symptoms or events. NA  Total Time spent with patient: 1.5 hours  Past Psychiatric History: Schizophrenia since  . Is the patient at risk to self? No.  Has the patient been a risk to self in the past 6 months? Yes.    Has the patient been a risk to self within the distant past? No.  Is the patient a risk to others? No.  Has the patient been a risk to others in the past 6 months? No.  Has the patient been a risk to others within the distant past? No.   [place] Scale:  Flowsheet Row [facility] ([facility]) [facility] from 10/24/2023 in [facility] ED from 10/19/2023 in [facility]  C-SSRS RISK CATEGORY Low Risk No Risk High Risk      Prior Inpatient Therapy: No. If yes, describe: Patient denies.  Prior Outpatient Therapy: Yes.   If yes, describe: Says he unable to remember.   Alcohol Screening: 1. How often do you have a drink containing alcohol?: Never 2. How many drinks containing alcohol do you have on a typical day when you are drinking?: 1 or 2 3. How often do you have six or more drinks on one occasion?: Never AUDIT-C Score: 0 4. How often during the last year have you found that you were not able to stop drinking once you had started?: Never 5.  How often during the last year have you failed to do what was normally expected from you because of drinking?: Never 6. How often during the last year have you needed a first drink in the morning to get yourself going after a heavy drinking session?: Never 7. How often during the last year have you had a feeling of guilt of remorse after drinking?: Never 8. How often during the last year have you been unable to remember what happened the night before because you had been drinking?: Never 9. Have you or someone else been injured as a result of your drinking?: No 10. Has a relative or friend or a doctor or another health worker been concerned about your drinking or suggested you cut down?: No Alcohol Use Disorder Identification Test Final Score (AUDIT): 0 Alcohol Brief Interventions/Follow-up: Alcohol education/Brief advice  Substance Abuse History in the last 12 months:  Yes.    Consequences of  Substance Abuse: Discussed with patient during this admission evaluation. Medical Consequences:  Liver damage, Possible death by overdose Legal Consequences:  Arrests, jail time, Loss of driving privilege. Family Consequences:  Family discord, divorce and or separation.  Previous Psychotropic Medications: Yes   Psychological Evaluations: Yes   Past Medical History:  Past Medical History:  Diagnosis Date   Hypertension    Schizophrenia (HCC)    History reviewed. No pertinent surgical history.  Family History: History reviewed. No pertinent family history.  Family Psychiatric  History: Patient denies any familial hx of mental illnesses,  Tobacco Screening:  Social History   Tobacco Use  Smoking Status Every Day   Current packs/day: 0.30   Types: Cigarettes  Smokeless Tobacco Not on file    BH Tobacco Counseling     Are you interested in Tobacco Cessation Medications?  No, patient refused Counseled patient on smoking cessation:  Refused/Declined practical counseling Reason Tobacco  Screening Not Completed: Patient Refused Screening       Social History:  Social History   Substance and Sexual Activity  Alcohol Use Not Currently     Social History   Substance and Sexual Activity  Drug Use Not Currently    Additional Social History:  Allergies:  No Known Allergies Lab Results:  Results for orders placed or performed during the hospital encounter of 10/24/23 (from the past 48 hours)  POCT Urine Drug Screen - (I-Screen)     Status: Abnormal   Collection Time: 10/24/23 10:01 PM  Result Value Ref Range   POC Amphetamine UR None Detected NONE DETECTED (Cut Off Level 1000 ng/mL)   POC Secobarbital (BAR) None Detected NONE DETECTED (Cut Off Level 300 ng/mL)   POC Buprenorphine (BUP) None Detected NONE DETECTED (Cut Off Level 10 ng/mL)   POC Oxazepam (BZO) None Detected NONE DETECTED (Cut Off Level 300 ng/mL)   POC Cocaine UR Positive (A) NONE DETECTED (Cut Off Level 300 ng/mL)   POC Methamphetamine UR None Detected NONE DETECTED (Cut Off Level 1000 ng/mL)   POC Morphine None Detected NONE DETECTED (Cut Off Level 300 ng/mL)   POC Methadone UR None Detected NONE DETECTED (Cut Off Level 300 ng/mL)   POC Oxycodone UR None Detected NONE DETECTED (Cut Off Level 100 ng/mL)   POC Marijuana UR None Detected NONE DETECTED (Cut Off Level 50 ng/mL)    Blood Alcohol level:  Lab Results  Component Value Date   ETH <10 05/17/2023   ETH <10 05/15/2023    Metabolic Disorder Labs:  No results found for: "HGBA1C", "MPG" No results found for: "PROLACTIN" Lab Results  Component Value Date   CHOL 152 10/19/2023   TRIG 144 10/19/2023   HDL 57 10/19/2023   CHOLHDL 2.7 10/19/2023   VLDL 29 10/19/2023   LDLCALC 66 10/19/2023    Current Medications: Current Facility-Administered Medications  Medication Dose Route Frequency Provider Last Rate Last Admin   acetaminophen  (TYLENOL ) tablet 650 mg  650 mg Oral Q6H PRN Ntuen, Tina C, FNP       alum & mag hydroxide-simeth  (MAALOX/MYLANTA) 200-200-20 MG/5ML suspension 30 mL  30 mL Oral Q4H PRN Ntuen, Tina C, FNP       haloperidol  (HALDOL ) tablet 5 mg  5 mg Oral TID PRN Ntuen, Tina C, FNP       And   diphenhydrAMINE  (BENADRYL ) capsule 50 mg  50 mg Oral TID PRN Ntuen, Tina C, FNP       haloperidol  lactate (HALDOL ) injection 10 mg  10  mg Intramuscular TID PRN Ntuen, Tina C, FNP       And   diphenhydrAMINE  (BENADRYL ) injection 50 mg  50 mg Intramuscular TID PRN Ntuen, Tina C, FNP       And   LORazepam  (ATIVAN ) injection 2 mg  2 mg Intramuscular TID PRN Ntuen, Tina C, FNP       haloperidol  lactate (HALDOL ) injection 5 mg  5 mg Intramuscular TID PRN Ntuen, Tina C, FNP       And   diphenhydrAMINE  (BENADRYL ) injection 50 mg  50 mg Intramuscular TID PRN Ntuen, Tina C, FNP       And   LORazepam  (ATIVAN ) injection 2 mg  2 mg Intramuscular TID PRN Ntuen, Tina C, FNP       FLUoxetine  (PROZAC ) capsule 10 mg  10 mg Oral Daily [place] Giammona I, NP   10 mg at 10/26/23 1525   hydrOXYzine  (ATARAX ) tablet 25 mg  25 mg Oral TID PRN Ntuen, Tina C, FNP   25 mg at 10/25/23 2113   magnesium  hydroxide (MILK OF MAGNESIA) suspension 30 mL  30 mL Oral Daily PRN Ntuen, Tina C, FNP       QUEtiapine  (SEROQUEL ) tablet 100 mg  100 mg Oral QHS Skippy Marhefka I, NP       QUEtiapine  (SEROQUEL ) tablet 25 mg  25 mg Oral BID Ntuen, Tina C, FNP   25 mg at 10/26/23 0827   traZODone  (DESYREL ) tablet 50 mg  50 mg Oral QHS PRN Ntuen, Tina C, FNP       PTA Medications: Medications Prior to Admission  Medication Sig Dispense Refill Last Dose/Taking   mirtazapine  (REMERON ) 15 MG tablet Take 1 tablet (15 mg total) by mouth at bedtime. 14 tablet 0    QUEtiapine  (SEROQUEL ) 25 MG tablet Take 1 tablet (25 mg total) by mouth 2 (two) times daily. 28 tablet 0    Musculoskeletal: Strength & Muscle Tone: within normal limits Gait & Station: normal Patient leans: N/A  Psychiatric Specialty Exam:  Presentation  General Appearance:  Casual; Fairly Groomed  Eye  Contact: Fair  Speech: Clear and Coherent; Normal Rate  Speech Volume: Normal  Handedness: Right   Mood and Affect  Mood: Depressed; Anxious  Affect: Congruent   Thought Process  Thought Processes: Coherent; Goal Directed; Linear   Days.Duration of Psychotic Symptoms: Greater than   Past Diagnosis of Schizophrenia or Psychoactive disorder: Yes  Descriptions of Associations:Intact  Orientation:Full (Time, Place and Person)  Thought Content:Logical  Hallucinations:Hallucinations: None Description of Visual Hallucinations: Denies.  Ideas of Reference:None  Suicidal Thoughts:Suicidal Thoughts: No SI Passive Intent and/or Plan: -- (Denies)  Homicidal Thoughts:Homicidal Thoughts: No   Sensorium  Memory: Immediate Good; Recent Good; Remote Good  Judgment: Fair  Insight: Fair   Art therapist  Concentration: Good  Attention Span: Good  Recall: Good  Fund of Knowledge: Fair  Language: Good   Psychomotor Activity  Psychomotor Activity: Psychomotor Activity: Normal   Assets  Assets: Desire for Improvement; Communication Skills; Physical Health   Sleep  Sleep: Sleep: Good Number of Hours of Sleep: 8.75  Physical Exam: Physical Exam Vitals and nursing note reviewed.  HENT:     Head: Normocephalic.     Nose: Nose normal.  Cardiovascular:     Rate and Rhythm: Normal rate.     Pulses: Normal pulses.  Pulmonary:     Effort: Pulmonary effort is normal.  Genitourinary:    Comments: Deferred Musculoskeletal:        General:  Normal range of motion.     Cervical back: Normal range of motion.  Skin:    General: Skin is dry.  Neurological:     General: No focal deficit present.     Mental Status: He is alert and oriented to person, place, and time.    Review of Systems  Constitutional:  Negative for chills and fever.  HENT:  Negative for congestion and sore throat.   Respiratory:  Negative for cough, shortness of breath  and wheezing.   Cardiovascular:  Negative for chest pain and palpitations.  Gastrointestinal:  Negative for abdominal pain, constipation, diarrhea, heartburn, nausea and vomiting.  Genitourinary:  Negative for dysuria.  Musculoskeletal:  Negative for joint pain and myalgias.  Neurological:  Negative for dizziness, tingling, tremors, sensory change, speech change, focal weakness, seizures, loss of consciousness, weakness and headaches.  Endo/Heme/Allergies:        NKDA  Psychiatric/Behavioral:  Positive for depression. Negative for suicidal ideas.    Blood pressure 119/75, pulse 84, temperature 98.7 F (37.1 C), temperature source Oral, resp. rate 18, height 6\' 2"  (1.88 m), weight 98.9 kg, SpO2 99%. Body mass index is 27.99 kg/m.  Treatment Plan Summary: Daily contact with patient to assess and evaluate symptoms and progress in treatment and Medication management.   Principal/active diagnoses.  Principal Problem: Undifferentiated schizophrenia.  Major depressive disorder with psychotic features..  Rule out:  Bipolar disorder, depressed-type.  Substance induced psychosis. Adjustment disorder with mixed anxiety and depressed mood  Plan: The risks/benefits/side-effects/alternatives to the medications in use were discussed in detail with the patient and time was given for patient's questions. The patient consents to medication trial.   -Initiated Seroquel  100 mg po Q bedtime for mood control.  -Initiated Fluoxetine  10 mg po daily for depression. -Continue Seroquel  25 mg po bid for day time agitation.  -Continue Trazodone  50 mg po Q bedtime for insomnia.  -Continue Hydroxyzine  25 mg po tid prn for anxiety.   Agitation protocols.  -Continue as recommended. See MAR.  Labs: To be obtained; TSH. CMP, Hgba1c, U/a.  Other PRNS -Continue Tylenol  650 mg every 6 hours PRN for mild pain -Continue Maalox 30 ml Q 4 hrs PRN for indigestion -Continue MOM 30 ml po Q 6 hrs for  constipation  Safety and Monitoring: Voluntary admission to inpatient psychiatric unit for safety, stabilization and treatment Daily contact with patient to assess and evaluate symptoms and progress in treatment Patient's case to be discussed in multi-disciplinary team meeting Observation Level : q15 minute checks Vital signs: q12 hours Precautions: Safety  Discharge Planning: Social work and case management to assist with discharge planning and identification of hospital follow-up needs prior to discharge Estimated LOS: 5-7 days Discharge Concerns: Need to establish a safety plan; Medication compliance and effectiveness Discharge Goals: Return home with outpatient referrals for mental health follow-up including medication management/psychotherapy  Observation Level/Precautions:  15 minute checks  Laboratory:   Per ED  Psychotherapy: Enrolled in the group sessions.  Medications: See MAR.   Consultations: As needed.  Discharge Concerns: Safety, mood stability.  Estimated LOS: 3-5 days.  Other: NA.    Physician Treatment Plan for Primary Diagnosis: Undifferentiated schizophrenia (HCC) Long Term Goal(s): Improvement in symptoms so as ready for discharge  Short Term Goals: Ability to identify changes in lifestyle to reduce recurrence of condition will improve, Ability to verbalize feelings will improve, Ability to disclose and discuss suicidal ideas, and Ability to demonstrate self-control will improve  Physician Treatment Plan for Secondary Diagnosis:  Principal Problem:   Undifferentiated schizophrenia (HCC) Active Problems:   Adjustment disorder with mixed anxiety and depressed mood  Long Term Goal(s): Improvement in symptoms so as ready for discharge  Short Term Goals: Ability to identify changes in lifestyle to reduce recurrence of condition will improve, Ability to verbalize feelings will improve, Ability to disclose and discuss suicidal ideas, and Ability to demonstrate  self-control will improve  I certify that inpatient services furnished can reasonably be expected to improve the patient's condition.    Asuncion Layer, NP, pmhnp, fnp-bc. 4/25/20254:13 PM - days.

## 2023-10-26 NOTE — Progress Notes (Signed)
  Aaron Rubio   Type of Note: Social Services  Pt given number and address to social services to call and check status on disability check.  Signed:  Daimien Patmon, LCSW-A 10/26/2023  1:55 PM

## 2023-10-26 NOTE — Progress Notes (Signed)
   10/26/23 0800  Psych Admission Type (Psych Patients Only)  Admission Status Voluntary  Psychosocial Assessment  Patient Complaints Suspiciousness;Isolation;Depression  Eye Contact Avertive  Facial Expression Flat  Affect Sad;Flat  Speech Logical/coherent  Interaction Cautious;Guarded  Motor Activity Slow  Appearance/Hygiene In scrubs  Behavior Characteristics Appropriate to situation  Mood Suspicious;Preoccupied  Aggressive Behavior  Effect No apparent injury  Thought Process  Coherency WDL  Content WDL  Delusions WDL  Perception WDL  Hallucination None reported or observed  Judgment WDL  Confusion WDL  Danger to Self  Current suicidal ideation? Denies  Agreement Not to Harm Self Yes  Description of Agreement Verbal  Danger to Others  Danger to Others None reported or observed

## 2023-10-26 NOTE — BH IP Treatment Plan (Signed)
 Interdisciplinary Treatment and Diagnostic Plan Update  10/26/2023 Time of Session: 10:45 AM Aaron Rubio MRN: [id number]  Principal Diagnosis: Undifferentiated schizophrenia [facility])  Secondary Diagnoses: Principal Problem:   Undifferentiated schizophrenia (HCC) Active Problems:   Adjustment disorder with mixed anxiety and depressed mood   Current Medications:  Current Facility-Administered Medications  Medication Dose Route Frequency Provider Last Rate Last Admin   acetaminophen  (TYLENOL ) tablet 650 mg  650 mg Oral Q6H PRN Ntuen, Tina C, FNP       alum & mag hydroxide-simeth (MAALOX/MYLANTA) 200-200-20 MG/5ML suspension 30 mL  30 mL Oral Q4H PRN Ntuen, Tina C, FNP       haloperidol  (HALDOL ) tablet 5 mg  5 mg Oral TID PRN Ntuen, Tina C, FNP       And   diphenhydrAMINE  (BENADRYL ) capsule 50 mg  50 mg Oral TID PRN Ntuen, Tina C, FNP       haloperidol  lactate (HALDOL ) injection 10 mg  10 mg Intramuscular TID PRN Ntuen, Tina C, FNP       And   diphenhydrAMINE  (BENADRYL ) injection 50 mg  50 mg Intramuscular TID PRN Ntuen, Tina C, FNP       And   LORazepam  (ATIVAN ) injection 2 mg  2 mg Intramuscular TID PRN Ntuen, Tina C, FNP       haloperidol  lactate (HALDOL ) injection 5 mg  5 mg Intramuscular TID PRN Ntuen, Tina C, FNP       And   diphenhydrAMINE  (BENADRYL ) injection 50 mg  50 mg Intramuscular TID PRN Ntuen, Tina C, FNP       And   LORazepam  (ATIVAN ) injection 2 mg  2 mg Intramuscular TID PRN Ntuen, Tina C, FNP       FLUoxetine  (PROZAC ) capsule 10 mg  10 mg Oral Daily Nwoko, Agnes I, NP   10 mg at 10/26/23 1525   hydrOXYzine  (ATARAX ) tablet 25 mg  25 mg Oral TID PRN Ntuen, Tina C, FNP   25 mg at 10/25/23 2113   magnesium  hydroxide (MILK OF MAGNESIA) suspension 30 mL  30 mL Oral Daily PRN Ntuen, Tina C, FNP       QUEtiapine  (SEROQUEL ) tablet 100 mg  100 mg Oral QHS Nwoko, Agnes I, NP       QUEtiapine  (SEROQUEL ) tablet 25 mg  25 mg Oral BID Ntuen, Tina C, FNP   25 mg at 10/26/23  3086   traZODone  (DESYREL ) tablet 50 mg  50 mg Oral QHS PRN Ntuen, Tina C, FNP       PTA Medications: Medications Prior to Admission  Medication Sig Dispense Refill Last Dose/Taking   mirtazapine  (REMERON ) 15 MG tablet Take 1 tablet (15 mg total) by mouth at bedtime. 14 tablet 0    QUEtiapine  (SEROQUEL ) 25 MG tablet Take 1 tablet (25 mg total) by mouth 2 (two) times daily. 28 tablet 0     Patient Stressors:    Patient Strengths:    Treatment Modalities: Medication Management, Group therapy, Case management,  1 to 1 session with clinician, Psychoeducation, Recreational therapy.   Physician Treatment Plan for Primary Diagnosis: Undifferentiated schizophrenia (HCC) Long Term Goal(s): Improvement in symptoms so as ready for discharge   Short Term Goals: Ability to identify changes in lifestyle to reduce recurrence of condition will improve Ability to verbalize feelings will improve Ability to disclose and discuss suicidal ideas Ability to demonstrate self-control will improve  Medication Management: Evaluate patient's response, side effects, and tolerance of medication regimen.  Therapeutic Interventions: 1 to 1  sessions, Unit Group sessions and Medication administration.  Evaluation of Outcomes: Not Progressing  Physician Treatment Plan for Secondary Diagnosis: Principal Problem:   Undifferentiated schizophrenia (HCC) Active Problems:   Adjustment disorder with mixed anxiety and depressed mood  Long Term Goal(s): Improvement in symptoms so as ready for discharge   Short Term Goals: Ability to identify changes in lifestyle to reduce recurrence of condition will improve Ability to verbalize feelings will improve Ability to disclose and discuss suicidal ideas Ability to demonstrate self-control will improve     Medication Management: Evaluate patient's response, side effects, and tolerance of medication regimen.  Therapeutic Interventions: 1 to 1 sessions, Unit Group sessions  and Medication administration.  Evaluation of Outcomes: Not Progressing   RN Treatment Plan for Primary Diagnosis: Undifferentiated schizophrenia (HCC) Long Term Goal(s): Knowledge of disease and therapeutic regimen to maintain health will improve  Short Term Goals: Ability to remain free from injury will improve, Ability to verbalize frustration and anger appropriately will improve, Ability to demonstrate self-control, Ability to participate in decision making will improve, Ability to verbalize feelings will improve, Ability to disclose and discuss suicidal ideas, Ability to identify and develop effective coping behaviors will improve, and Compliance with prescribed medications will improve  Medication Management: RN will administer medications as ordered by provider, will assess and evaluate patient's response and provide education to patient for prescribed medication. RN will report any adverse and/or side effects to prescribing provider.  Therapeutic Interventions: 1 on 1 counseling sessions, Psychoeducation, Medication administration, Evaluate responses to treatment, Monitor vital signs and CBGs as ordered, Perform/monitor CIWA, COWS, AIMS and Fall Risk screenings as ordered, Perform wound care treatments as ordered.  Evaluation of Outcomes: Not Progressing   LCSW Treatment Plan for Primary Diagnosis: Undifferentiated schizophrenia (HCC) Long Term Goal(s): Safe transition to appropriate next level of care at discharge, Engage patient in therapeutic group addressing interpersonal concerns.  Short Term Goals: Engage patient in aftercare planning with referrals and resources, Increase social support, Increase ability to appropriately verbalize feelings, Increase emotional regulation, Facilitate acceptance of mental health diagnosis and concerns, Facilitate patient progression through stages of change regarding substance use diagnoses and concerns, Identify triggers associated with mental  health/substance abuse issues, and Increase skills for wellness and recovery  Therapeutic Interventions: Assess for all discharge needs, 1 to 1 time with Social worker, Explore available resources and support systems, Assess for adequacy in community support network, Educate family and significant other(s) on suicide prevention, Complete Psychosocial Assessment, Interpersonal group therapy.  Evaluation of Outcomes: Not Progressing   Progress in Treatment: Attending groups: No. Participating in groups: No. Taking medication as prescribed: Yes. Toleration medication: Yes. Family/Significant other contact made: patient declined consents Patient understands diagnosis: Yes. Discussing patient identified problems/goals with staff: Yes. Medical problems stabilized or resolved: Yes. Denies suicidal/homicidal ideation: Yes. Issues/concerns per patient self-inventory: No.  New problem(s) identified:  No  New Short Term/Long Term Goal(s):    medication stabilization, elimination of SI thoughts, development of comprehensive mental wellness plan.   Patient Goals:  "I want to re-establish contact with my family and focus on giving and receiving encouragement."    Discharge Plan or Barriers:  Patient recently admitted. CSW will continue to follow and assess for appropriate referrals and possible discharge planning.     Reason for Continuation of Hospitalization: Hallucinations Medication stabilization Suicidal ideation  Estimated Length of Stay:  5 - 7 days  Last 3 [place] Suicide Severity Risk Score: Flowsheet Row [facility]24/2023 in [facility] from 10[facility]19/2023 in [facility]  C-SSRS RISK CATEGORY Low Risk No Risk High Risk       Last [facility] 2/9 Scores:     No data to display          Scribe for Treatment Team: Rudell Marlowe O Tressie Ragin,  LCSWA 10/26/2023 5:37 PM

## 2023-10-26 NOTE — Group Note (Signed)
 Date:  10/26/2023 Time:  1:34 PM  Group Topic/Focus:  Goals Group:   The focus of this group is to help patients establish daily goals to achieve during treatment and discuss how the patient can incorporate goal setting into their daily lives to aide in recovery. Orientation:   The focus of this group is to educate the patient on the purpose and policies of crisis stabilization and provide a format to answer questions about their admission.  The group details unit policies and expectations of patients while admitted.    Participation Level:  Did Not Attend    [name] 10/26/2023, 1:34 PM

## 2023-10-26 NOTE — BHH Suicide Risk Assessment (Addendum)
 Suicide Risk Assessment  Admission Assessment    [facility] Admission Suicide Risk Assessment   Nursing information obtained from:     Demographic factors:  Male, Low socioeconomic status, Unemployed  Current Mental Status:  Suicidal ideation indicated by others  Loss Factors:  Financial problems / change in socioeconomic status  Historical Factors:  Impulsivity  Risk Reduction Factors:  NA  Total Time spent with patient: 1.5 hours  Principal Problem: <principal problem not specified>  Diagnosis:  Active Problems:   * No active hospital problems. *  Subjective Data: See H&P.  Continued Clinical Symptoms:  Alcohol Use Disorder Identification Test Final Score (AUDIT): 0 The "Alcohol Use Disorders Identification Test", Guidelines for Use in Primary Care, Second Edition.  World Science writer [facility]). Score between 0-7:  no or low risk or alcohol related problems. Score between 8-15:  moderate risk of alcohol related problems. Score between 16-19:  high risk of alcohol related problems. Score 20 or above:  warrants further diagnostic evaluation for alcohol dependence and treatment.  CLINICAL FACTORS:   Depression:   Insomnia Schizophrenia:   Depressive state Chronic Pain More than one psychiatric diagnosis Previous Psychiatric Diagnoses and Treatments  Musculoskeletal: Strength & Muscle Tone: within normal limits Gait & Station: normal Patient leans: N/A  Psychiatric Specialty Exam:  Presentation  General Appearance:  Disheveled  Eye Contact: Fair  Speech: Clear and Coherent  Speech Volume: Normal  Handedness: Right   Mood and Affect  Mood: Anxious; Depressed; Dysphoric  Affect: Congruent; Constricted   Thought Process  Thought Processes: Disorganized (Patient reporst "I cannot think for myself")  Descriptions of Associations:Tangential  Orientation:Partial  Thought Content:Illogical  History of Schizophrenia/Schizoaffective  disorder:Yes  Duration of Psychotic Symptoms:Greater than six months  Hallucinations:Hallucinations: None Description of Visual Hallucinations: Patient reports, "seeing cars driving back and forth"  Ideas of Reference:None  Suicidal Thoughts:Suicidal Thoughts: No SI Passive Intent and/or Plan: -- (Denies)  Homicidal Thoughts:Homicidal Thoughts: No   Sensorium  Memory: Immediate Poor; Recent Poor (Patient reports," I cannot think for myself")  Judgment: Poor  Insight: Poor   Executive Functions  Concentration: Fair  Attention Span: Fair  Recall: Poor  Fund of Knowledge: Poor  Language: Fair  Psychomotor Activity  Psychomotor Activity: Psychomotor Activity: Normal  Assets  Assets: Physical Health; Resilience  Sleep  Sleep: Sleep: Good Number of Hours of Sleep: 8  Physical Exam: See H&P.  Blood pressure 119/75, pulse 84, temperature 98.7 F (37.1 C), temperature source Oral, resp. rate 18, height 6\' 2"  (1.88 m), weight 98.9 kg, SpO2 99%. Body mass index is 27.99 kg/m.  COGNITIVE FEATURES THAT CONTRIBUTE TO RISK:  None    SUICIDE RISK:   Moderate:  Frequent suicidal ideation with limited intensity, and duration, some specificity in terms of plans, no associated intent, good self-control, limited dysphoria/symptomatology, some risk factors present, and identifiable protective factors, including available and accessible social support.  PLAN OF CARE: See H&P.  I certify that inpatient services furnished can reasonably be expected to improve the patient's condition.   Asuncion Layer, NP, pmhno, fnp-bc. 10/26/2023, 11:50 AM

## 2023-10-26 NOTE — Group Note (Signed)
 Recreation Therapy Group Note   Group Topic:Communication  Group Date: 10/26/2023 Start Time: 1610 End Time: 1004 Facilitators: Malley Hauter-McCall, LRT,CTRS Location: [address] Dayroom   Group Topic: Communication, Problem Solving   Goal Area(s) Addresses:  Patient will effectively listen to complete activity.  Patient will identify communication skills used to make activity successful.  Patient will identify how skills used during activity can be used to reach post d/c goals.    Intervention: Building surveyor Activity - Geometric pattern cards, pencils, blank paper    Activity: Geometric Drawings.  Three volunteers from the peer group will be shown an abstract picture with a particular arrangement of geometrical shapes.  Each round, one 'speaker' will describe the pattern, as accurately as possible without revealing the image to the group.  The remaining group members will listen and draw the picture to reflect how it is described to them. Patients with the role of 'listener' cannot ask clarifying questions but, may request that the speaker repeat a direction. Once the drawings are complete, the presenter will show the rest of the group the picture and compare how close each person came to drawing the picture. LRT will facilitate a post-activity discussion regarding effective communication and the importance of planning, listening, and asking for clarification in daily interactions with others.  Education: Environmental consultant, Active listening, Support systems, Discharge planning  Education Outcome: Acknowledges understanding/In group clarification offered/Needs additional education.    Affect/Mood: N/A   Participation Level: Did not attend    Clinical Observations/Individualized Feedback:    Plan: Continue to engage patient in RT group sessions 2-3x/week.   Aaron Rubio, LRT,CTRS 10/26/2023 12:47 PM

## 2023-10-26 NOTE — Progress Notes (Signed)
 Patient alert and oriented. Denies SI, HI, AVH, and pain. Patient endorses depression rated 7/10. Scheduled medications administered to patient, per provider orders. Support and encouragement provided. Routine safety checks conducted every 15 minutes. Patient verbally contracts for safety and remains safe on the unit.    10/26/23 2117  Psych Admission Type (Psych Patients Only)  Admission Status Voluntary  Psychosocial Assessment  Patient Complaints Suspiciousness;Isolation;Depression  Eye Contact Brief  Facial Expression Flat  Affect Flat  Speech Logical/coherent  Interaction Cautious;Guarded  Motor Activity Slow  Appearance/Hygiene Unremarkable  Behavior Characteristics Cooperative  Mood Suspicious;Preoccupied;Depressed  Thought Process  Coherency WDL  Content WDL  Delusions WDL  Perception WDL  Hallucination None reported or observed  Judgment WDL  Confusion WDL  Danger to Self  Current suicidal ideation? Denies  Self-Injurious Behavior No self-injurious ideation or behavior indicators observed or expressed   Agreement Not to Harm Self Yes  Description of Agreement verbal  Danger to Others  Danger to Others None reported or observed

## 2023-10-26 NOTE — Group Note (Signed)
 Date:  10/26/2023 Time:  1:46 PM  Group Topic/Focus:  Healthy Communication:   The focus of this group is to discuss boundaries and healthy ways to communicate with others.    Participation Level:  Did Not Attend   [name] 10/26/2023, 1:46 PM

## 2023-10-26 NOTE — BHH Counselor (Signed)
 Adult Comprehensive Assessment  Patient ID: Aaron Rubio, male   DOB: Apr 28, 1981, 43 y.o.   MRN: [id number]  Information Source: Information source: Patient  Current Stressors:  Patient states their primary concerns and needs for treatment are:: "Depression and suicidal thoughts" Patient states their goals for this hospitilization and ongoing recovery are:: "I want to work on anger and letting things go that I need to, finding stability" Educational / Learning stressors: None reported Employment / Job issues: None reported Family Relationships: "They really aren't there for me" Financial / Lack of resources (include bankruptcy): "Yeah" Housing / Lack of housing: Homeless "I sleep at the bus stop sometimes" Physical health (include injuries & life threatening diseases): "Lower back pain" Social relationships: None reported Substance abuse: None reported Bereavement / Loss: None reported  Living/Environment/Situation:  Living Arrangements: Alone Living conditions (as described by patient or guardian): Homeless Who else lives in the home?: NA How long has patient lived in current situation?: Since 2019 What is atmosphere in current home: Chaotic, Dangerous, Temporary  Family History:  Marital status: Single Are you sexually active?: No Has your sexual activity been affected by drugs, alcohol, medication, or emotional stress?: No Does patient have children?: No  Childhood History:  Additional childhood history information: Raised by great grandparents Description of patient's relationship with caregiver when they were a child: "It was fair" Patient's description of current relationship with people who raised him/her: "They are alright" How were you disciplined when you got in trouble as a child/adolescent?: UTA Does patient have siblings?: No Did patient suffer any verbal/emotional/physical/sexual abuse as a child?: No Did patient suffer from severe childhood neglect?:  No Has patient ever been sexually abused/assaulted/raped as an adolescent or adult?: No Was the patient ever a victim of a crime or a disaster?: No Witnessed domestic violence?: No Has patient been affected by domestic violence as an adult?: No  Education:  Highest grade of school patient has completed: 12 Currently a Consulting civil engineer?: No Learning disability?: No  Employment/Work Situation:   Employment Situation: Unemployed Patient's Job has Been Impacted by Current Illness: No What is the Longest Time Patient has Held a Job?: 1.5 years 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?: No  Financial Resources:   Surveyor, quantity resources: No income, Medicaid, Food stamps Does patient have a Lawyer or guardian?: No  Alcohol/Substance Abuse:   What has been your use of drugs/alcohol within the last 12 months?: None If attempted suicide, did drugs/alcohol play a role in this?: No Alcohol/Substance Abuse Treatment Hx: Denies past history Has alcohol/substance abuse ever caused legal problems?: No  Social Support System:   Conservation officer, nature Support System: Poor Describe Community Support System: "No one really" Type of faith/religion: None How does patient's faith help to cope with current illness?: None  Leisure/Recreation:   Do You Have Hobbies?: No  Strengths/Needs:   What is the patient's perception of their strengths?: "I don't know" Patient states they can use these personal strengths during their treatment to contribute to their recovery: NA Patient states these barriers may affect/interfere with their treatment: None reported Patient states these barriers may affect their return to the community: "I have no where to go"  Discharge Plan:   Currently receiving community mental health services: No Patient states concerns and preferences for aftercare planning are: No therapist or psychiatrist Patient  states they will know when they are safe and ready for discharge when: "When I  am stable" Does patient have access to transportation?: No Does patient have financial barriers related to discharge medications?: No Plan for no access to transportation at discharge: CSW to arrange Will patient be returning to same living situation after discharge?:  (Unsure at this time)  Summary/Recommendations:   Summary and Recommendations (to be completed by the evaluator): Aaron Rubio is a 43yo male who is voluntarily admitted to [facility] secondary to [facility] due to SI with a plan to overdose on medication. Presented to [facility] paranoid and reported seeing spiders crawling on himself recently. Stressors include experiencing homelessness since 2019, no family support, financial stress, and lower back pain. Denies drug and alcohol use (UDS -). Denies AVH and HI, endorses passive SI but contracts for safety. Reports recently getting out of prison and thought he would get a disability check but has not received one at this point. Number given to pt to reach out regarding this. Does not follow up with any mental health providers but is interested in services at [facility]. While here, [name] can benefit from crisis stabilization, medication management, therapeutic milieu, and referrals for services.   Aaron Rubio. 10/26/2023

## 2023-10-27 DIAGNOSIS — F203 Undifferentiated schizophrenia: Secondary | ICD-10-CM | POA: Diagnosis not present

## 2023-10-27 LAB — COMPREHENSIVE METABOLIC PANEL WITH GFR
ALT: 27 U/L (ref 0–44)
AST: 25 U/L (ref 15–41)
Albumin: 3.4 g/dL — ABNORMAL LOW (ref 3.5–5.0)
Alkaline Phosphatase: 61 U/L (ref 38–126)
Anion gap: 9 (ref 5–15)
BUN: 19 mg/dL (ref 6–20)
CO2: 23 mmol/L (ref 22–32)
Calcium: 8.8 mg/dL — ABNORMAL LOW (ref 8.9–10.3)
Chloride: 108 mmol/L (ref 98–111)
Creatinine, Ser: 1.14 mg/dL (ref 0.61–1.24)
GFR, Estimated: >60 mL/min (ref >60)
Glucose, Bld: 95 mg/dL (ref 70–99)
Potassium: 4 mmol/L (ref 3.5–5.1)
Sodium: 140 mmol/L (ref 135–145)
Total Bilirubin: 0.3 mg/dL (ref 0.0–1.2)
Total Protein: 6 g/dL — ABNORMAL LOW (ref 6.5–8.1)

## 2023-10-27 LAB — TSH: TSH: 0.962 u[IU]/mL (ref 0.350–4.500)

## 2023-10-27 LAB — HEMOGLOBIN A1C
Hgb A1c MFr Bld: 5 % (ref 4.8–5.6)
Mean Plasma Glucose: 96.8 mg/dL

## 2023-10-27 NOTE — Progress Notes (Signed)
   10/27/23 2207  Psych Admission Type (Psych Patients Only)  Admission Status Voluntary  Psychosocial Assessment  Patient Complaints Anxiety  Eye Contact Brief  Facial Expression Flat  Affect Appropriate to circumstance  Speech Logical/coherent  Interaction Guarded  Motor Activity Other (Comment) (WDL)  Appearance/Hygiene Unremarkable  Behavior Characteristics Appropriate to situation  Mood Depressed  Thought Process  Coherency WDL  Content WDL  Delusions None reported or observed  Perception WDL  Hallucination None reported or observed  Judgment Poor  Confusion None  Danger to Self  Current suicidal ideation? Denies  Self-Injurious Behavior No self-injurious ideation or behavior indicators observed or expressed   Agreement Not to Harm Self Yes  Description of Agreement verbal  Danger to Others  Danger to Others None reported or observed

## 2023-10-27 NOTE — Progress Notes (Signed)
   10/27/23 0900  Psych Admission Type (Psych Patients Only)  Admission Status Voluntary  Psychosocial Assessment  Patient Complaints Suspiciousness;Isolation;Depression  Eye Contact Brief  Facial Expression Flat  Affect Flat  Speech Logical/coherent  Interaction Cautious;Guarded  Motor Activity Slow  Appearance/Hygiene Unremarkable  Behavior Characteristics Cooperative  Mood Suspicious;Preoccupied;Depressed  Thought Process  Coherency WDL  Content WDL  Delusions None reported or observed  Perception WDL  Hallucination None reported or observed  Judgment WDL  Confusion WDL  Danger to Self  Current suicidal ideation? Denies  Self-Injurious Behavior No self-injurious ideation or behavior indicators observed or expressed   Agreement Not to Harm Self Yes  Description of Agreement verbal  Danger to Others  Danger to Others None reported or observed

## 2023-10-27 NOTE — Plan of Care (Signed)

## 2023-10-27 NOTE — Plan of Care (Signed)
  Problem: Education: Goal: Mental status will improve Outcome: Progressing Goal: Verbalization of understanding the information provided will improve Outcome: Progressing   Problem: Safety: Goal: Periods of time without injury will increase Outcome: Progressing   

## 2023-10-27 NOTE — Group Note (Unsigned)
 Date:  10/28/2023 Time:  5:06 AM  Group Topic/Focus:  Wrap-Up Group:   The focus of this group is to help patients review their daily goal of treatment and discuss progress on daily workbooks.    Participation Level:  Active  Participation Quality:  Appropriate and Sharing  Affect:  Appropriate  Cognitive:  Appropriate  Insight: Appropriate  Engagement in Group:  Engaged  Modes of Intervention:  Activity and Socialization  Additional Comments:  Patients used wrap up group sheets. Patient rated his day a "6 out of 10". Patient did not shared a goal for today. Patient shared coping skills that he finds most helpful, "self talk". Patient shared something he likes about himself, "all the above".   Dillard Frame 10/28/2023, 5:06 AM

## 2023-10-27 NOTE — Progress Notes (Signed)
 [facility] MD Progress Note  10/27/2023 3:54 PM Aaron Rubio  MRN:  [id number] Subjective:  Patient tolerating Seroquel  and Prozac  well and states he feels calmer and slept well last night. Denies SI, HI or AVH today. Patient isolative to room today and states he feels on edge around others. Patient notes difficulty adjusting to life after correctional environment and feeling like noone will help. States he has felt hopeless and like all the doors are closed and has been depressed the past 2 weeks. Tolerating medications well and denies any side effects. Principal Problem: Undifferentiated schizophrenia (HCC) Diagnosis: Principal Problem:   Undifferentiated schizophrenia (HCC) Active Problems:   Adjustment disorder with mixed anxiety and depressed mood  Total Time spent with patient: 30 minutes   Past Medical History:  Past Medical History:  Diagnosis Date   Hypertension    Schizophrenia (HCC)    History reviewed. No pertinent surgical history. Family History: History reviewed. No pertinent family history. Family Psychiatric  History: Patient denies any familial hx of mental illnesses,  Social History:  Social History   Substance and Sexual Activity  Alcohol Use Not Currently     Social History   Substance and Sexual Activity  Drug Use Not Currently    Social History   Socioeconomic History   Marital status: Single    Spouse name: Not on file   Number of children: Not on file   Years of education: Not on file   Highest education level: Not on file  Occupational History   Not on file  Tobacco Use   Smoking status: Every Day    Current packs/day: 0.30    Types: Cigarettes   Smokeless tobacco: Not on file  Substance and Sexual Activity   Alcohol use: Not Currently   Drug use: Not Currently   Sexual activity: Not on file  Other Topics Concern   Not on file  Social History Narrative   Not on file   Social Drivers of Health   Financial Resource Strain: Not on file   Food Insecurity: Food Insecurity Present (10/25/2023)   Hunger Vital Sign    Worried About Running Out of Food in the Last Year: Often true    Ran Out of Food in the Last Year: Often true  Transportation Needs: No Transportation Needs (10/25/2023)   PRAPARE - Administrator, Civil Service (Medical): No    Lack of Transportation (Non-Medical): No  Physical Activity: Not on file  Stress: Not on file  Social Connections: Unknown (05/08/2022)   Received from [facility]   Social Network    Social Network: Not on file   Additional Social History:                         Sleep: Fair  Appetite:  Fair  Current Medications: Current Facility-Administered Medications  Medication Dose Route Frequency Provider Last Rate Last Admin   acetaminophen  (TYLENOL ) tablet 650 mg  650 mg Oral Q6H PRN Ntuen, Tina C, FNP       alum & mag hydroxide-simeth (MAALOX/MYLANTA) 200-200-20 MG/5ML suspension 30 mL  30 mL Oral Q4H PRN Ntuen, Tina C, FNP       haloperidol  (HALDOL ) tablet 5 mg  5 mg Oral TID PRN Ntuen, Tina C, FNP       And   diphenhydrAMINE  (BENADRYL ) capsule 50 mg  50 mg Oral TID PRN Ntuen, Tina C, FNP       haloperidol  lactate (HALDOL ) injection  10 mg  10 mg Intramuscular TID PRN Ntuen, Tina C, FNP       And   diphenhydrAMINE  (BENADRYL ) injection 50 mg  50 mg Intramuscular TID PRN Ntuen, Tina C, FNP       And   LORazepam  (ATIVAN ) injection 2 mg  2 mg Intramuscular TID PRN Ntuen, Tina C, FNP       haloperidol  lactate (HALDOL ) injection 5 mg  5 mg Intramuscular TID PRN Ntuen, Tina C, FNP       And   diphenhydrAMINE  (BENADRYL ) injection 50 mg  50 mg Intramuscular TID PRN Ntuen, Tina C, FNP       And   LORazepam  (ATIVAN ) injection 2 mg  2 mg Intramuscular TID PRN Ntuen, Tina C, FNP       FLUoxetine  (PROZAC ) capsule 10 mg  10 mg Oral Daily Nwoko, Agnes I, NP   10 mg at 10/27/23 6045   hydrOXYzine  (ATARAX ) tablet 25 mg  25 mg Oral TID PRN Ntuen, Tina C, FNP   25 mg at 10/25/23  2113   magnesium  hydroxide (MILK OF MAGNESIA) suspension 30 mL  30 mL Oral Daily PRN Ntuen, Tina C, FNP       QUEtiapine  (SEROQUEL ) tablet 100 mg  100 mg Oral QHS Asuncion Layer I, NP   100 mg at 10/26/23 2117   QUEtiapine  (SEROQUEL ) tablet 25 mg  25 mg Oral BID Ntuen, Tina C, FNP   25 mg at 10/27/23 0744   traZODone  (DESYREL ) tablet 50 mg  50 mg Oral QHS PRN Ntuen, Tina C, FNP        Lab Results:  Results for orders placed or performed during the hospital encounter of 10/25/23 (from the past 48 hours)  Comprehensive metabolic panel     Status: Abnormal   Collection Time: 10/27/23  6:20 AM  Result Value Ref Range   Sodium 140 135 - 145 mmol/L   Potassium 4.0 3.5 - 5.1 mmol/L   Chloride 108 98 - 111 mmol/L   CO2 23 22 - 32 mmol/L   Glucose, Bld 95 70 - 99 mg/dL    Comment: Glucose reference range applies only to samples taken after fasting for at least 8 hours.   BUN 19 6 - 20 mg/dL   Creatinine, Ser 4.09 0.61 - 1.24 mg/dL   Calcium 8.8 (L) 8.9 - 10.3 mg/dL   Total Protein 6.0 (L) 6.5 - 8.1 g/dL   Albumin 3.4 (L) 3.5 - 5.0 g/dL   AST 25 15 - 41 U/L   ALT 27 0 - 44 U/L   Alkaline Phosphatase 61 38 - 126 U/L   Total Bilirubin 0.3 0.0 - 1.2 mg/dL   GFR, Estimated >81 >19 mL/min    Comment: (NOTE) Calculated using the CKD-EPI Creatinine Equation (2021)    Anion gap 9 5 - 15    Comment: Performed at [facility], [address], [place], [place] [address]  TSH     Status: None   Collection Time: 10/27/23  6:20 AM  Result Value Ref Range   TSH 0.962 0.350 - 4.500 uIU/mL    Comment: Performed by a 3rd Generation assay with a functional sensitivity of <=0.01 uIU/mL. Performed at [facility], [address], [place], [place] [address]   Hemoglobin A1c     Status: None   Collection Time: 10/27/23  6:20 AM  Result Value Ref Range   Hgb A1c MFr Bld 5.0 4.8 - 5.6 %    Comment: (  NOTE) Pre diabetes:          5.7%-6.4%  Diabetes:               >6.4%  Glycemic control for   <7.0% adults with diabetes    Mean Plasma Glucose 96.8 mg/dL    Comment: Performed at [facility], [address], [place], [place] [address]    Blood Alcohol level:  Lab Results  Component Value Date   ETH <10 05/17/2023   ETH <10 05/15/2023    Metabolic Disorder Labs: Lab Results  Component Value Date   HGBA1C 5.0 10/27/2023   MPG 96.8 10/27/2023   No results found for: "PROLACTIN" Lab Results  Component Value Date   CHOL 152 10/19/2023   TRIG 144 10/19/2023   HDL 57 10/19/2023   CHOLHDL 2.7 10/19/2023   VLDL 29 10/19/2023   LDLCALC 66 10/19/2023    Physical Findings: AIMS:  , ,  ,  ,    CIWA:    COWS:     Musculoskeletal: Strength & Muscle Tone: within normal limits Gait & Station: normal Patient leans: N/A  Psychiatric Specialty Exam:  Presentation  General Appearance:  Casual; Fairly Groomed  Eye Contact: Fair  Speech: Clear and Coherent; Normal Rate  Speech Volume: Normal  Handedness: Right   Mood and Affect  Mood: Depressed; Anxious  Affect: Congruent   Thought Process  Thought Processes: Coherent; Goal Directed; Linear  Descriptions of Associations:Intact  Orientation:Full (Time, Place and Person)  Thought Content:Logical  History of Schizophrenia/Schizoaffective disorder:Yes  Duration of Psychotic Symptoms:Greater than six months  Hallucinations:Hallucinations: None Description of Visual Hallucinations: Denies.  Ideas of Reference:None  Suicidal Thoughts:Suicidal Thoughts: No  Homicidal Thoughts:Homicidal Thoughts: No   Sensorium  Memory: Immediate Good; Recent Good; Remote Good  Judgment: Fair  Insight: Fair   Art therapist  Concentration: Good  Attention Span: Good  Recall: Good  Fund of Knowledge: Fair  Language: Good   Psychomotor Activity  Psychomotor Activity: Psychomotor Activity: Normal   Assets  Assets: Desire for Improvement;  Communication Skills; Physical Health   Sleep  Sleep: Sleep: Good Number of Hours of Sleep: 8.75    Physical Exam: Physical exam: Please see exam on admit note. General: Well developed, well nourished.  Pupils: Normal at 3mm Respiratory: Breathing is unlabored.  Cardiovascular: No edema.  Language: No anomia, no aphasia Muscle strength and tone-pt moving all extremities.  Gait not assessed as pt remained in bed.  Neuro: Facial muscles are symmetric. Pt without tremor, no evidence of hyperarousal.  Review of Systems  Constitutional: Negative.   HENT: Negative.    Eyes: Negative.   Respiratory: Negative.    Cardiovascular: Negative.   Gastrointestinal: Negative.   Genitourinary: Negative.   Musculoskeletal: Negative.   Skin: Negative.   Neurological: Negative.   Endo/Heme/Allergies: Negative.   Psychiatric/Behavioral:  Positive for depression. The patient is nervous/anxious.    Blood pressure (!) 124/90, pulse 80, temperature 98 F (36.7 C), temperature source Oral, resp. rate 18, height 6\' 2"  (1.88 m), weight 98.9 kg, SpO2 99%. Body mass index is 27.99 kg/m.   Treatment Plan Summary: 4/26: patient reporting worsening depression since being released from [facility] with no resources and feeling hopeless. Present as dyphoric. Denies SI or HI today. Patient tolerating restarting medications and notes benefit. Denies AVH today.  Working with case management.   Daily contact with patient to assess and evaluate symptoms and progress in treatment, Medication management, and Plan Cont Seroquel  25 mg BID +  100 mg at bedtime, continue Prozac  10 mg qdaily  Ric Rosenberg, MD 10/27/2023, 3:54 PM

## 2023-10-27 NOTE — Group Note (Signed)
 LCSW Group Therapy Note   Group Date: 10/27/2023 Start Time: 1030 End Time: 1145   Type of Therapy and Topic:  Group Therapy: Healthy Mind Set   Participation Level:  Did Not Attend  Description of Group: Group today was about growth mind set how we can change the way we think about ourselves. We also spoke about how self esteem plays a part with our mind set.    Therapeutic Goals:   1. Changing mind set to to help with daily decisions as well coping skills.      Summary of Patient Progress:     Patient did not attend    Therapeutic Modalities:   MI / CBT    Mariano Shiver, LCSWA 10/27/2023  1:28 PM

## 2023-10-28 DIAGNOSIS — F203 Undifferentiated schizophrenia: Secondary | ICD-10-CM | POA: Diagnosis not present

## 2023-10-28 MED ORDER — QUETIAPINE FUMARATE 200 MG PO TABS
200.0000 mg | ORAL_TABLET | Freq: Every day | ORAL | Status: DC
  Administered 2023-10-28 – 2023-10-30 (×3): 200 mg via ORAL
  Filled 2023-10-28: qty 1
  Filled 2023-10-28: qty 14
  Filled 2023-10-28 (×3): qty 1

## 2023-10-28 NOTE — Progress Notes (Signed)
   10/28/23 0530  15 Minute Checks  Location Bedroom  Visual Appearance Calm  Behavior Sleeping  Sleep (Behavioral Health Patients Only)  Calculate sleep? (Click Yes once per 24 hr at 0600 safety check) Yes  Documented sleep last 24 hours 5.75

## 2023-10-28 NOTE — Group Note (Signed)
 Date:  10/28/2023 Time:  2:14 PM  Group Topic/Focus:  Developing a Wellness Toolbox:   The focus of this group is to help patients develop a "wellness toolbox" with skills and strategies to promote recovery upon discharge.    Participation Level:  Did Not Attend   Aaron Rubio 10/28/2023, 2:14 PM

## 2023-10-28 NOTE — Progress Notes (Signed)
 [facility] MD Progress Note  10/28/2023 2:38 PM Aaron Rubio  MRN:  [id number]  Nursing staff reports that patient is reporting AVH.   Subjective:  Patient tolerating Seroquel  and Prozac . Reports depressed mood and VH of "visions of things" last night and states he didn't sleep well. Agreeable to further Seroquel  increase and denies side effects. Denies SI, HI. Patient isolative to room today and states he feels on edge around others.   Principal Problem: Undifferentiated schizophrenia (HCC) Diagnosis: Principal Problem:   Undifferentiated schizophrenia (HCC) Active Problems:   Adjustment disorder with mixed anxiety and depressed mood  Total Time spent with patient: 30 minutes   Past Medical History:  Past Medical History:  Diagnosis Date   Hypertension    Schizophrenia (HCC)    History reviewed. No pertinent surgical history. Family History: History reviewed. No pertinent family history. Family Psychiatric  History: Patient denies any familial hx of mental illnesses,  Social History:  Social History   Substance and Sexual Activity  Alcohol Use Not Currently     Social History   Substance and Sexual Activity  Drug Use Not Currently    Social History   Socioeconomic History   Marital status: Single    Spouse name: Not on file   Number of children: Not on file   Years of education: Not on file   Highest education level: Not on file  Occupational History   Not on file  Tobacco Use   Smoking status: Every Day    Current packs/day: 0.30    Types: Cigarettes   Smokeless tobacco: Not on file  Substance and Sexual Activity   Alcohol use: Not Currently   Drug use: Not Currently   Sexual activity: Not on file  Other Topics Concern   Not on file  Social History Narrative   Not on file   Social Drivers of Health   Financial Resource Strain: Not on file  Food Insecurity: Food Insecurity Present (10/25/2023)   Hunger Vital Sign    Worried About Running Out of Food  in the Last Year: Often true    Ran Out of Food in the Last Year: Often true  Transportation Needs: No Transportation Needs (10/25/2023)   PRAPARE - Administrator, Civil Service (Medical): No    Lack of Transportation (Non-Medical): No  Physical Activity: Not on file  Stress: Not on file  Social Connections: Unknown (05/08/2022)   Received from [facility]   Social Network    Social Network: Not on file   Additional Social History:                         Sleep: Fair  Appetite:  Fair  Current Medications: Current Facility-Administered Medications  Medication Dose Route Frequency Provider Last Rate Last Admin   acetaminophen  (TYLENOL ) tablet 650 mg  650 mg Oral Q6H PRN Ntuen, Tina C, FNP       alum & mag hydroxide-simeth (MAALOX/MYLANTA) 200-200-20 MG/5ML suspension 30 mL  30 mL Oral Q4H PRN Ntuen, Tina C, FNP       haloperidol  (HALDOL ) tablet 5 mg  5 mg Oral TID PRN Ntuen, Tina C, FNP       And   diphenhydrAMINE  (BENADRYL ) capsule 50 mg  50 mg Oral TID PRN Ntuen, Tina C, FNP       haloperidol  lactate (HALDOL ) injection 10 mg  10 mg Intramuscular TID PRN Ntuen, Tina C, FNP  And   diphenhydrAMINE  (BENADRYL ) injection 50 mg  50 mg Intramuscular TID PRN Ntuen, Tina C, FNP       And   LORazepam  (ATIVAN ) injection 2 mg  2 mg Intramuscular TID PRN Ntuen, Tina C, FNP       haloperidol  lactate (HALDOL ) injection 5 mg  5 mg Intramuscular TID PRN Ntuen, Tina C, FNP       And   diphenhydrAMINE  (BENADRYL ) injection 50 mg  50 mg Intramuscular TID PRN Ntuen, Tina C, FNP       And   LORazepam  (ATIVAN ) injection 2 mg  2 mg Intramuscular TID PRN Ntuen, Tina C, FNP       FLUoxetine  (PROZAC ) capsule 10 mg  10 mg Oral Daily Nwoko, Agnes I, NP   10 mg at 10/28/23 0745   hydrOXYzine  (ATARAX ) tablet 25 mg  25 mg Oral TID PRN Ntuen, Tina C, FNP   25 mg at 10/27/23 2112   magnesium  hydroxide (MILK OF MAGNESIA) suspension 30 mL  30 mL Oral Daily PRN Ntuen, Tina C, FNP        QUEtiapine  (SEROQUEL ) tablet 200 mg  200 mg Oral QHS Anzlee Hinesley, MD       QUEtiapine  (SEROQUEL ) tablet 25 mg  25 mg Oral BID Ntuen, Tina C, FNP   25 mg at 10/28/23 0745   traZODone  (DESYREL ) tablet 50 mg  50 mg Oral QHS PRN Ntuen, Tina C, FNP   50 mg at 10/27/23 2112    Lab Results:  Results for orders placed or performed during the hospital encounter of 10/25/23 (from the past 48 hours)  Comprehensive metabolic panel     Status: Abnormal   Collection Time: 10/27/23  6:20 AM  Result Value Ref Range   Sodium 140 135 - 145 mmol/L   Potassium 4.0 3.5 - 5.1 mmol/L   Chloride 108 98 - 111 mmol/L   CO2 23 22 - 32 mmol/L   Glucose, Bld 95 70 - 99 mg/dL    Comment: Glucose reference range applies only to samples taken after fasting for at least 8 hours.   BUN 19 6 - 20 mg/dL   Creatinine, Ser 7.82 0.61 - 1.24 mg/dL   Calcium 8.8 (L) 8.9 - 10.3 mg/dL   Total Protein 6.0 (L) 6.5 - 8.1 g/dL   Albumin 3.4 (L) 3.5 - 5.0 g/dL   AST 25 15 - 41 U/L   ALT 27 0 - 44 U/L   Alkaline Phosphatase 61 38 - 126 U/L   Total Bilirubin 0.3 0.0 - 1.2 mg/dL   GFR, Estimated >95 >62 mL/min    Comment: (NOTE) Calculated using the CKD-EPI Creatinine Equation (2021)    Anion gap 9 5 - 15    Comment: Performed at [facility], [address], [place], [place] [address]  TSH     Status: None   Collection Time: 10/27/23  6:20 AM  Result Value Ref Range   TSH 0.962 0.350 - 4.500 uIU/mL    Comment: Performed by a 3rd Generation assay with a functional sensitivity of <=0.01 uIU/mL. Performed at [facility], [address], [place], [place] [address]   Hemoglobin A1c     Status: None   Collection Time: 10/27/23  6:20 AM  Result Value Ref Range   Hgb A1c MFr Bld 5.0 4.8 - 5.6 %    Comment: (NOTE) Pre diabetes:          5.7%-6.4%  Diabetes:              >  6.4%  Glycemic control for   <7.0% adults with diabetes    Mean Plasma Glucose 96.8 mg/dL    Comment: Performed at  [facility], [address] [place], [place] [address]    Blood Alcohol level:  Lab Results  Component Value Date   ETH <10 05/17/2023   ETH <10 05/15/2023    Metabolic Disorder Labs: Lab Results  Component Value Date   HGBA1C 5.0 10/27/2023   MPG 96.8 10/27/2023   No results found for: "PROLACTIN" Lab Results  Component Value Date   CHOL 152 10/19/2023   TRIG 144 10/19/2023   HDL 57 10/19/2023   CHOLHDL 2.7 10/19/2023   VLDL 29 10/19/2023   LDLCALC 66 10/19/2023    Physical Findings: AIMS:  , ,  ,  ,    CIWA:    COWS:     Musculoskeletal: Strength & Muscle Tone: within normal limits Gait & Station: normal Patient leans: N/A  Psychiatric Specialty Exam:  Presentation  General Appearance:  Casual; Fairly Groomed  Eye Contact: Fair  Speech: Clear and Coherent; Normal Rate  Speech Volume: Normal  Handedness: Right   Mood and Affect  Mood: Depressed; Anxious  Affect: Congruent   Thought Process  Thought Processes: Coherent; Goal Directed; Linear  Descriptions of Associations:Intact  Orientation:Full (Time, Place and Person)  Thought Content:Logical  History of Schizophrenia/Schizoaffective disorder:Yes  Duration of Psychotic Symptoms:Greater than six months  Hallucinations:No data recorded  Ideas of Reference:None  Suicidal Thoughts:No data recorded  Homicidal Thoughts:No data recorded   Sensorium  Memory: Immediate Good; Recent Good; Remote Good  Judgment: Fair  Insight: Fair   Art therapist  Concentration: Good  Attention Span: Good  Recall: Good  Fund of Knowledge: Fair  Language: Good   Psychomotor Activity  Psychomotor Activity: No data recorded   Assets  Assets: Desire for Improvement; Communication Skills; Physical Health   Sleep  Sleep: No data recorded    Physical Exam: Physical exam: Please see exam on admit note. General: Well developed, well nourished.  Pupils:  Normal at 3mm Respiratory: Breathing is unlabored.  Cardiovascular: No edema.  Language: No anomia, no aphasia Muscle strength and tone-pt moving all extremities.  Gait not assessed as pt remained in bed.  Neuro: Facial muscles are symmetric. Pt without tremor, no evidence of hyperarousal.  Review of Systems  Constitutional: Negative.   HENT: Negative.    Eyes: Negative.   Respiratory: Negative.    Cardiovascular: Negative.   Gastrointestinal: Negative.   Genitourinary: Negative.   Musculoskeletal: Negative.   Skin: Negative.   Neurological: Negative.   Endo/Heme/Allergies: Negative.   Psychiatric/Behavioral:  Positive for depression. The patient is nervous/anxious.    Blood pressure (!) 136/99, pulse 74, temperature 98 F (36.7 C), temperature source Oral, resp. rate 18, height 6\' 2"  (1.88 m), weight 98.9 kg, SpO2 99%. Body mass index is 27.99 kg/m.   Treatment Plan Summary: 4/26: patient reporting worsening depression since being [facility] from [facility] with no resources and feeling hopeless. Present as dyphoric. Denies SI or HI today. Patient tolerating restarting medications and notes benefit. Denies AVH today.  Working with case management.   4/27: patient reporting VH and depression. Denies SI or HI. Agreeable to further Seroquel  increase. Feels paranoid and on edge around others. Future oriented on where he will go on discharge.  Daily contact with patient to assess and evaluate symptoms and progress in treatment, Medication management, and Plan Cont Seroquel  25 mg BID + increase to 200 mg  at bedtime for reported paranoid and hallucinations as well as depressed mood, continue Prozac  10 mg qdaily  Honor Frison, MD 10/28/2023, 2:38 PM

## 2023-10-28 NOTE — Progress Notes (Signed)
   10/28/23 2300  Psych Admission Type (Psych Patients Only)  Admission Status Voluntary  Psychosocial Assessment  Patient Complaints Depression;Isolation  Eye Contact Brief  Facial Expression Flat  Affect Sullen  Speech Logical/coherent  Interaction Isolative;Guarded  Motor Activity Other (Comment) (WNL)  Appearance/Hygiene Unremarkable  Behavior Characteristics Cooperative;Appropriate to situation  Mood Depressed;Suspicious  Thought Process  Coherency WDL  Content WDL  Delusions None reported or observed  Perception WDL  Hallucination None reported or observed  Judgment Limited  Confusion None  Danger to Self  Current suicidal ideation? Denies  Description of Suicide Plan None  Self-Injurious Behavior No self-injurious ideation or behavior indicators observed or expressed   Agreement Not to Harm Self Yes  Description of Agreement Verbal contract for safety

## 2023-10-28 NOTE — Plan of Care (Signed)

## 2023-10-28 NOTE — Progress Notes (Signed)
 Adult Psychoeducational Group Note  Date:  10/28/2023 Time:  8:42 PM  Group Topic/Focus:  Wrap-Up Group:   The focus of this group is to help patients review their daily goal of treatment and discuss progress on daily workbooks.  Participation Level:  Active  Participation Quality:  Appropriate  Affect:  Appropriate  Cognitive:  Appropriate  Insight: Appropriate  Engagement in Group:  Engaged  Modes of Intervention:  Discussion  Additional Comments:  Pt stated his day was restful.  Pt goal for the day was to get rest.  Pt met goal.  Aaron Rubio 10/28/2023, 8:42 PM

## 2023-10-28 NOTE — Progress Notes (Signed)
   10/28/23 0800  Psych Admission Type (Psych Patients Only)  Admission Status Voluntary  Psychosocial Assessment  Patient Complaints Isolation;Depression  Eye Contact Brief  Facial Expression Flat  Affect Flat  Speech Logical/coherent  Interaction Guarded  Motor Activity Other (Comment) (WNL)  Appearance/Hygiene Unremarkable  Behavior Characteristics Cooperative;Appropriate to situation  Mood Depressed;Preoccupied  Thought Process  Coherency WDL  Content WDL  Delusions None reported or observed  Perception Hallucinations  Hallucination Auditory;Visual  Judgment Limited  Confusion None  Danger to Self  Current suicidal ideation? Denies  Self-Injurious Behavior No self-injurious ideation or behavior indicators observed or expressed   Agreement Not to Harm Self Yes  Description of Agreement verbal  Danger to Others  Danger to Others None reported or observed

## 2023-10-28 NOTE — Group Note (Signed)
 Date:  10/28/2023 Time:  8:49 AM  Group Topic/Focus:  Goals Group:   The focus of this group is to help patients establish daily goals to achieve during treatment and discuss how the patient can incorporate goal setting into their daily lives to aide in recovery.    Participation Level:  Did Not Attend   Ellan Gunner 10/28/2023, 8:49 AM

## 2023-10-28 NOTE — Plan of Care (Signed)

## 2023-10-28 NOTE — Plan of Care (Signed)
   Problem: Education: Goal: Knowledge of [facility] Education information/materials will improve Outcome: Progressing   Problem: Education: Goal: Verbalization of understanding the information provided will improve Outcome: Progressing

## 2023-10-29 DIAGNOSIS — F203 Undifferentiated schizophrenia: Secondary | ICD-10-CM | POA: Diagnosis not present

## 2023-10-29 NOTE — Progress Notes (Signed)
 [facility] MD Progress Note  10/29/2023 12:51 PM Aaron Rubio  MRN:  [id number]   Subjective:  Patient tolerating increase of Seroquel  and reports improved sleep last night. States he feels less irritable and on edge around others. Patient is future oriented on anxious on where he will go when he is discharged. Denies AVH today.  Denies SI, HI. Encouraged patient to call sober living houses as well as shelters.    Principal Problem: Undifferentiated schizophrenia (HCC) Diagnosis: Principal Problem:   Undifferentiated schizophrenia (HCC) Active Problems:   Adjustment disorder with mixed anxiety and depressed mood  Total Time spent with patient: 30 minutes   Past Medical History:  Past Medical History:  Diagnosis Date   Hypertension    Schizophrenia (HCC)    History reviewed. No pertinent surgical history. Family History: History reviewed. No pertinent family history. Family Psychiatric  History: Patient denies any familial hx of mental illnesses,  Social History:  Social History   Substance and Sexual Activity  Alcohol Use Not Currently     Social History   Substance and Sexual Activity  Drug Use Not Currently    Social History   Socioeconomic History   Marital status: Single    Spouse name: Not on file   Number of children: Not on file   Years of education: Not on file   Highest education level: Not on file  Occupational History   Not on file  Tobacco Use   Smoking status: Every Day    Current packs/day: 0.30    Types: Cigarettes   Smokeless tobacco: Not on file  Substance and Sexual Activity   Alcohol use: Not Currently   Drug use: Not Currently   Sexual activity: Not on file  Other Topics Concern   Not on file  Social History Narrative   Not on file   Social Drivers of Health   Financial Resource Strain: Not on file  Food Insecurity: Food Insecurity Present (10/25/2023)   Hunger Vital Sign    Worried About Running Out of Food in the Last Year: Often  true    Ran Out of Food in the Last Year: Often true  Transportation Needs: No Transportation Needs (10/25/2023)   PRAPARE - Administrator, Civil Service (Medical): No    Lack of Transportation (Non-Medical): No  Physical Activity: Not on file  Stress: Not on file  Social Connections: Unknown (05/08/2022)   Received from [facility]   Social Network    Social Network: Not on file   Additional Social History:                         Sleep: Fair  Appetite:  Fair  Current Medications: Current Facility-Administered Medications  Medication Dose Route Frequency Provider Last Rate Last Admin   acetaminophen  (TYLENOL ) tablet 650 mg  650 mg Oral Q6H PRN Ntuen, Tina C, FNP       alum & mag hydroxide-simeth (MAALOX/MYLANTA) 200-200-20 MG/5ML suspension 30 mL  30 mL Oral Q4H PRN Ntuen, Tina C, FNP       haloperidol  (HALDOL ) tablet 5 mg  5 mg Oral TID PRN Ntuen, Tina C, FNP       And   diphenhydrAMINE  (BENADRYL ) capsule 50 mg  50 mg Oral TID PRN Ntuen, Tina C, FNP       haloperidol  lactate (HALDOL ) injection 10 mg  10 mg Intramuscular TID PRN Ntuen, Dorinda Garland, FNP       And  diphenhydrAMINE  (BENADRYL ) injection 50 mg  50 mg Intramuscular TID PRN Ntuen, Tina C, FNP       And   LORazepam  (ATIVAN ) injection 2 mg  2 mg Intramuscular TID PRN Ntuen, Tina C, FNP       haloperidol  lactate (HALDOL ) injection 5 mg  5 mg Intramuscular TID PRN Ntuen, Tina C, FNP       And   diphenhydrAMINE  (BENADRYL ) injection 50 mg  50 mg Intramuscular TID PRN Ntuen, Tina C, FNP       And   LORazepam  (ATIVAN ) injection 2 mg  2 mg Intramuscular TID PRN Ntuen, Tina C, FNP       FLUoxetine  (PROZAC ) capsule 10 mg  10 mg Oral Daily Nwoko, Devra Fontana I, NP   10 mg at 10/29/23 0825   hydrOXYzine  (ATARAX ) tablet 25 mg  25 mg Oral TID PRN Ntuen, Tina C, FNP   25 mg at 10/27/23 2112   magnesium  hydroxide (MILK OF MAGNESIA) suspension 30 mL  30 mL Oral Daily PRN Ntuen, Tina C, FNP       QUEtiapine  (SEROQUEL )  tablet 200 mg  200 mg Oral QHS Rakiya Krawczyk, MD   200 mg at 10/28/23 2102   QUEtiapine  (SEROQUEL ) tablet 25 mg  25 mg Oral BID Ntuen, Tina C, FNP   25 mg at 10/29/23 0825   traZODone  (DESYREL ) tablet 50 mg  50 mg Oral QHS PRN Ntuen, Tina C, FNP   50 mg at 10/28/23 2102    Lab Results:  No results found for this or any previous visit (from the past 48 hours).   Blood Alcohol level:  Lab Results  Component Value Date   ETH <10 05/17/2023   ETH <10 05/15/2023    Metabolic Disorder Labs: Lab Results  Component Value Date   HGBA1C 5.0 10/27/2023   MPG 96.8 10/27/2023   No results found for: "PROLACTIN" Lab Results  Component Value Date   CHOL 152 10/19/2023   TRIG 144 10/19/2023   HDL 57 10/19/2023   CHOLHDL 2.7 10/19/2023   VLDL 29 10/19/2023   LDLCALC 66 10/19/2023    Physical Findings: AIMS:  , ,  ,  ,    CIWA:    COWS:     Musculoskeletal: Strength & Muscle Tone: within normal limits Gait & Station: normal Patient leans: N/A  Psychiatric Specialty Exam:  Presentation  General Appearance:  Casual; Fairly Groomed  Eye Contact: Fair  Speech: Clear and Coherent; Normal Rate  Speech Volume: Normal  Handedness: Right   Mood and Affect  Mood: Depressed; Anxious  Affect: Congruent   Thought Process  Thought Processes: Coherent; Goal Directed; Linear  Descriptions of Associations:Intact  Orientation:Full (Time, Place and Person)  Thought Content:Logical  History of Schizophrenia/Schizoaffective disorder:Yes  Duration of Psychotic Symptoms:Greater than six months  Hallucinations:No data recorded  Ideas of Reference:None  Suicidal Thoughts:No data recorded  Homicidal Thoughts:No data recorded   Sensorium  Memory: Immediate Good; Recent Good; Remote Good  Judgment: Fair  Insight: Fair   Art therapist  Concentration: Good  Attention Span: Good  Recall: Good  Fund of  Knowledge: Fair  Language: Good   Psychomotor Activity  Psychomotor Activity: No data recorded   Assets  Assets: Desire for Improvement; Communication Skills; Physical Health   Sleep  Sleep: No data recorded    Physical Exam: Physical exam: Please see exam on admit note. General: Well developed, well nourished.  Pupils: Normal at 3mm Respiratory: Breathing is unlabored.  Cardiovascular: No edema.  Language: No anomia, no aphasia Muscle strength and tone-pt moving all extremities.  Gait not assessed as pt remained in bed.  Neuro: Facial muscles are symmetric. Pt without tremor, no evidence of hyperarousal.  Review of Systems  Constitutional: Negative.   HENT: Negative.    Eyes: Negative.   Respiratory: Negative.    Cardiovascular: Negative.   Gastrointestinal: Negative.   Genitourinary: Negative.   Musculoskeletal: Negative.   Skin: Negative.   Neurological: Negative.   Endo/Heme/Allergies: Negative.   Psychiatric/Behavioral:  Positive for depression. The patient is nervous/anxious.    Blood pressure (!) 143/90, pulse 61, temperature 98 F (36.7 C), temperature source Oral, resp. rate 18, height 6\' 2"  (1.88 m), weight 98.9 kg, SpO2 100%. Body mass index is 27.99 kg/m.   Treatment Plan Summary: 4/26: patient reporting worsening depression since [facility] with no resources and feeling hopeless. Present as dyphoric. Denies SI or HI today. Patient tolerating restarting medications and notes benefit. Denies AVH today.  Working with case management.   4/27: patient reporting VH and depression. Denies SI or HI. Agreeable to further Seroquel  increase. Feels paranoid and on edge around others. Future oriented on where he will go on discharge.  4/28: patient tolerating Seroquel  increase and reports benefit. Reported to be suspicious by staff. Denies SI or HI Today, denies wanting to be dead. Declines further medication changes. Plan for discharge on  Wednesday if remains stable.   Daily contact with patient to assess and evaluate symptoms and progress in treatment, Medication management, and Plan Cont Seroquel  25 mg BID + cont Seroquel  200 mg at bedtime for reported paranoid and hallucinations as well as depressed mood, continue Prozac  10 mg qdaily  Robby Bulkley, MD 10/29/2023, 12:51 PM

## 2023-10-29 NOTE — Plan of Care (Signed)
  Problem: Activity: Goal: Interest or engagement in activities will improve Outcome: Progressing   Problem: Coping: Goal: Ability to verbalize frustrations and anger appropriately will improve Outcome: Progressing   Problem: Physical Regulation: Goal: Ability to maintain clinical measurements within normal limits will improve Outcome: Progressing   

## 2023-10-29 NOTE — Plan of Care (Signed)
  Problem: Education: Goal: Emotional status will improve Outcome: Progressing   Problem: Activity: Goal: Interest or engagement in activities will improve Outcome: Progressing Goal: Sleeping patterns will improve Outcome: Progressing

## 2023-10-29 NOTE — Group Note (Signed)
 Recreation Therapy Group Note   Group Topic:Stress Management  Group Date: 10/29/2023 Start Time: 0930 End Time: 6160 Facilitators: Ceazia Harb-McCall, LRT,CTRS Location: [address] Dayroom   Group Topic: Stress Management  Goal Area(s) Addresses:  Patient will identify positive stress management techniques. Patient will identify benefits of using stress management post d/c.  Intervention: Insight Timer App  Activity: Morning Meditation. LRT played a meditation that focused on preparing for the day ahead. The meditation guided patients to envision being on the beach and breathing with the flow of the water washing onto the sand. The meditation also encouraged patients to picture the positives they should expect during the day.    Education:  Stress Management, Discharge Planning.   Education Outcome: Acknowledges Education   Affect/Mood: N/A   Participation Level: Did not attend    Clinical Observations/Individualized Feedback:     Plan: Continue to engage patient in RT group sessions 2-3x/week.   Aaron Rubio, LRT,CTRS 10/29/2023 12:54 PM

## 2023-10-29 NOTE — Progress Notes (Signed)
   10/29/23 0800  Psych Admission Type (Psych Patients Only)  Admission Status Voluntary  Psychosocial Assessment  Patient Complaints Depression;Isolation  Eye Contact Brief  Facial Expression Flat  Affect Depressed  Speech Logical/coherent  Interaction Isolative;Guarded  Motor Activity Other (Comment) (WNL)  Appearance/Hygiene Unremarkable  Behavior Characteristics Cooperative;Appropriate to situation  Mood Depressed;Suspicious  Thought Process  Coherency WDL  Content WDL  Delusions None reported or observed  Perception WDL  Hallucination None reported or observed  Judgment Limited  Confusion None  Danger to Self  Current suicidal ideation? Denies  Self-Injurious Behavior No self-injurious ideation or behavior indicators observed or expressed   Agreement Not to Harm Self Yes  Description of Agreement Verbal Contracts for safety  Danger to Others  Danger to Others None reported or observed

## 2023-10-29 NOTE — Group Note (Signed)
 Date:  10/29/2023 Time:  9:27 AM  Group Topic/Focus:  Goals Group:   The focus of this group is to help patients establish daily goals to achieve during treatment and discuss how the patient can incorporate goal setting into their daily lives to aide in recovery.    Participation Level:  Did Not Attend  Linnell Richardson 10/29/2023, 9:27 AM

## 2023-10-29 NOTE — Plan of Care (Signed)
  Problem: Education: Goal: Emotional status will improve Outcome: Progressing   Problem: Activity: Goal: Interest or engagement in activities will improve Outcome: Progressing Goal: Sleeping patterns will improve Outcome: Progressing   Problem: Safety: Goal: Periods of time without injury will increase Outcome: Progressing

## 2023-10-29 NOTE — Progress Notes (Signed)
  [facility]   Type of Note: Resources  Patient was given list of shelters and [place] [facility] near the [place]. Pt was encouraged to begin making phone calls. Pt vocalized understanding and was agreeable.  Signed:  Abdullahi Vallone, LCSW-A 10/29/2023  3:53 PM

## 2023-10-29 NOTE — [facility] Group Notes (Signed)
 [facility] Group Notes:  (Nursing/MHT/Case Management/Adjunct)  Date:  10/29/2023  Time:  11:07 PM  Type of Therapy:  Psychoeducational Skills  Participation Level:  Did Not Attend  Participation Quality:  Resistant  Affect:  Resistant  Cognitive:  Lacking  Insight:  None  Engagement in Group:  None  Modes of Intervention:  Education  Summary of Progress/Problems: Patient did not attend group this evening.   Aaron Rubio S 10/29/2023, 11:07 PM

## 2023-10-29 NOTE — Progress Notes (Signed)
 Pt observed in his room responding to people not seen by staff    10/29/23 2045  Psych Admission Type (Psych Patients Only)  Admission Status Voluntary  Psychosocial Assessment  Patient Complaints Depression  Eye Contact Brief  Facial Expression Flat  Affect Depressed  Speech Logical/coherent  Interaction Cautious;Guarded  Motor Activity Slow  Appearance/Hygiene Unremarkable  Behavior Characteristics Cooperative  Mood Suspicious;Preoccupied  Aggressive Behavior  Effect No apparent injury  Thought Process  Coherency WDL  Content WDL  Delusions WDL  Perception Hallucinations  Hallucination Auditory  Judgment Impaired  Confusion WDL  Danger to Self  Current suicidal ideation? Denies  Danger to Others  Danger to Others None reported or observed

## 2023-10-30 DIAGNOSIS — F203 Undifferentiated schizophrenia: Secondary | ICD-10-CM | POA: Diagnosis not present

## 2023-10-30 MED ORDER — TRAZODONE HCL 50 MG PO TABS
50.0000 mg | ORAL_TABLET | Freq: Every evening | ORAL | 0 refills | Status: DC | PRN
Start: 2023-10-30 — End: 2023-11-03

## 2023-10-30 MED ORDER — HYDROXYZINE HCL 25 MG PO TABS: 25.0000 mg | ORAL_TABLET | Freq: Three times a day (TID) | ORAL | 0 refills | Status: DC | PRN

## 2023-10-30 MED ORDER — FLUOXETINE HCL 10 MG PO CAPS: 10.0000 mg | ORAL_CAPSULE | Freq: Every day | ORAL | 0 refills | Status: DC

## 2023-10-30 MED ORDER — QUETIAPINE FUMARATE 200 MG PO TABS: 200.0000 mg | ORAL_TABLET | Freq: Every day | ORAL | 0 refills | Status: DC

## 2023-10-30 MED ORDER — QUETIAPINE FUMARATE 25 MG PO TABS: 25.0000 mg | ORAL_TABLET | Freq: Two times a day (BID) | ORAL | 0 refills | Status: DC

## 2023-10-30 NOTE — Plan of Care (Signed)
   Problem: Education: Goal: Emotional status will improve Outcome: Progressing Goal: Mental status will improve Outcome: Progressing   Problem: Activity: Goal: Sleeping patterns will improve Outcome: Progressing   Problem: Safety: Goal: Periods of time without injury will increase Outcome: Progressing

## 2023-10-30 NOTE — [facility] Suicide Risk Assessment (Signed)
 [facility] INPATIENT:  Family/Significant Other Suicide Prevention Education  Suicide Prevention Education:  Patient Refusal for Family/Significant Other Suicide Prevention Education: The patient Aaron Rubio has refused to provide written consent for family/significant other to be provided Family/Significant Other Suicide Prevention Education during admission and/or prior to discharge.  Physician notified.  Vonzell Guerin 10/30/2023, 1:57 PM

## 2023-10-30 NOTE — Progress Notes (Signed)
 [facility] MD Progress Note  10/30/2023 9:42 AM Aaron Rubio  MRN:  [id number]   Subjective: Evaluation this morning patient does appear depressed but reports improvement in his depression and anxiety since admission, today he scales depressed mood 5 out of 10 compared to 10 out of 10 at time of admission.  He denies any passive SI wishing self dead or active SI wanting to harm himself.  He reports last time had a thoughts of wishing himself that was at least couple of days ago.  He denies AVH and reports last time was 2 weeks ago.  He reports his admission was triggered by "I was having a nervous breakdown I had a lot of things going on" he is able to discuss coping skills with stressors.  He denies alcohol use prior to admission but admits to sporadic cocaine use.  He denies any craving to cocaine.  He denies side effect to current medication regimen and agrees to comply after discharge.  He reports a plan to stay at the homeless shelter after discharge from here.  He continues to present withdrawn and somehow guarded but denies paranoia or other delusions.   Principal Problem: Undifferentiated schizophrenia (HCC) Diagnosis: Principal Problem:   Undifferentiated schizophrenia (HCC) Active Problems:   Adjustment disorder with mixed anxiety and depressed mood  Total Time spent with patient: 30 minutes   Past Medical History:  Past Medical History:  Diagnosis Date   Hypertension    Schizophrenia (HCC)    History reviewed. No pertinent surgical history. Family History: History reviewed. No pertinent family history. Family Psychiatric  History: Patient denies any familial hx of mental illnesses,  Social History:  Social History   Substance and Sexual Activity  Alcohol Use Not Currently     Social History   Substance and Sexual Activity  Drug Use Not Currently    Social History   Socioeconomic History   Marital status: Single    Spouse name: Not on file   Number of children: Not  on file   Years of education: Not on file   Highest education level: Not on file  Occupational History   Not on file  Tobacco Use   Smoking status: Every Day    Current packs/day: 0.30    Types: Cigarettes   Smokeless tobacco: Not on file  Substance and Sexual Activity   Alcohol use: Not Currently   Drug use: Not Currently   Sexual activity: Not on file  Other Topics Concern   Not on file  Social History Narrative   Not on file   Social Drivers of Health   Financial Resource Strain: Not on file  Food Insecurity: Food Insecurity Present (10/25/2023)   Hunger Vital Sign    Worried About Running Out of Food in the Last Year: Often true    Ran Out of Food in the Last Year: Often true  Transportation Needs: No Transportation Needs (10/25/2023)   PRAPARE - Administrator, Civil Service (Medical): No    Lack of Transportation (Non-Medical): No  Physical Activity: Not on file  Stress: Not on file  Social Connections: Unknown (05/08/2022)   Received from [facility]   Social Network    Social Network: Not on file   Additional Social History:                         Sleep: Fair  Appetite:  Fair  Current Medications: Current Facility-Administered Medications  Medication Dose  Route Frequency Provider Last Rate Last Admin   acetaminophen  (TYLENOL ) tablet 650 mg  650 mg Oral Q6H PRN Ntuen, Tina C, FNP       alum & mag hydroxide-simeth (MAALOX/MYLANTA) 200-200-20 MG/5ML suspension 30 mL  30 mL Oral Q4H PRN Ntuen, Tina C, FNP       haloperidol  (HALDOL ) tablet 5 mg  5 mg Oral TID PRN Ntuen, Tina C, FNP       And   diphenhydrAMINE  (BENADRYL ) capsule 50 mg  50 mg Oral TID PRN Ntuen, Tina C, FNP       haloperidol  lactate (HALDOL ) injection 10 mg  10 mg Intramuscular TID PRN Ntuen, Tina C, FNP       And   diphenhydrAMINE  (BENADRYL ) injection 50 mg  50 mg Intramuscular TID PRN Ntuen, Tina C, FNP       And   LORazepam  (ATIVAN ) injection 2 mg  2 mg Intramuscular  TID PRN Ntuen, Tina C, FNP       haloperidol  lactate (HALDOL ) injection 5 mg  5 mg Intramuscular TID PRN Ntuen, Tina C, FNP       And   diphenhydrAMINE  (BENADRYL ) injection 50 mg  50 mg Intramuscular TID PRN Ntuen, Tina C, FNP       And   LORazepam  (ATIVAN ) injection 2 mg  2 mg Intramuscular TID PRN Ntuen, Tina C, FNP       FLUoxetine  (PROZAC ) capsule 10 mg  10 mg Oral Daily Nwoko, Agnes I, NP   10 mg at 10/30/23 0750   hydrOXYzine  (ATARAX ) tablet 25 mg  25 mg Oral TID PRN Ntuen, Tina C, FNP   25 mg at 10/27/23 2112   magnesium  hydroxide (MILK OF MAGNESIA) suspension 30 mL  30 mL Oral Daily PRN Ntuen, Tina C, FNP       QUEtiapine  (SEROQUEL ) tablet 200 mg  200 mg Oral QHS Zouev, Dmitri, MD   200 mg at 10/29/23 2126   QUEtiapine  (SEROQUEL ) tablet 25 mg  25 mg Oral BID Ntuen, Tina C, FNP   25 mg at 10/30/23 0750   traZODone  (DESYREL ) tablet 50 mg  50 mg Oral QHS PRN Ntuen, Tina C, FNP   50 mg at 10/29/23 2126    Lab Results:  No results found for this or any previous visit (from the past 48 hours).   Blood Alcohol level:  Lab Results  Component Value Date   ETH <10 05/17/2023   ETH <10 05/15/2023    Metabolic Disorder Labs: Lab Results  Component Value Date   HGBA1C 5.0 10/27/2023   MPG 96.8 10/27/2023   No results found for: "PROLACTIN" Lab Results  Component Value Date   CHOL 152 10/19/2023   TRIG 144 10/19/2023   HDL 57 10/19/2023   CHOLHDL 2.7 10/19/2023   VLDL 29 10/19/2023   LDLCALC 66 10/19/2023    Physical Findings: AIMS:  , ,  ,  ,    CIWA:    COWS:     Musculoskeletal: Strength & Muscle Tone: within normal limits Gait & Station: normal Patient leans: N/A  Psychiatric Specialty Exam:  Presentation  General Appearance:  Casual; Fairly Groomed  Eye Contact: Fair  Speech: Clear and Coherent; Normal Rate  Speech Volume: Normal  Handedness: Right   Mood and Affect  Mood: Depressed; Anxious  Affect: Congruent   Thought Process  Thought  Processes: Coherent; Goal Directed; Linear  Descriptions of Associations:Intact  Orientation:Full (Time, Place and Person)  Thought Content:Logical  History of Schizophrenia/Schizoaffective disorder:Yes  Duration of Psychotic Symptoms:Greater than six months  Hallucinations:No data recorded  Ideas of Reference:None  Suicidal Thoughts:No data recorded  Homicidal Thoughts:No data recorded   Sensorium  Memory: Immediate Good; Recent Good; Remote Good  Judgment: Fair  Insight: Fair   Art therapist  Concentration: Good  Attention Span: Good  Recall: Good  Fund of Knowledge: Fair  Language: Good   Psychomotor Activity  Psychomotor Activity: No data recorded   Assets  Assets: Desire for Improvement; Communication Skills; Physical Health   Sleep  Sleep: No data recorded    Physical Exam: Physical exam: Please see exam on admit note. General: Well developed, well nourished.  Pupils: Normal at 3mm Respiratory: Breathing is unlabored.  Cardiovascular: No edema.  Language: No anomia, no aphasia Muscle strength and tone-pt moving all extremities.  Gait not assessed as pt remained in bed.  Neuro: Facial muscles are symmetric. Pt without tremor, no evidence of hyperarousal.  Review of Systems  Constitutional: Negative.   HENT: Negative.    Eyes: Negative.   Respiratory: Negative.    Cardiovascular: Negative.   Gastrointestinal: Negative.   Genitourinary: Negative.   Musculoskeletal: Negative.   Skin: Negative.   Neurological: Negative.   Endo/Heme/Allergies: Negative.   Psychiatric/Behavioral:  Positive for depression. The patient is not nervous/anxious.   All other systems reviewed and are negative.  Blood pressure (!) 125/96, pulse 79, temperature 98.1 F (36.7 C), temperature source Oral, resp. rate 18, height 6\' 2"  (1.88 m), weight 98.9 kg, SpO2 100%. Body mass index is 27.99 kg/m.   Treatment Plan Summary:  Daily contact  with patient to assess and evaluate symptoms and progress in treatment, Medication management, and Plan Cont Seroquel  25 mg BID + cont Seroquel  200 mg at bedtime for reported paranoid and hallucinations as well as depressed mood, continue Prozac  10 mg qdaily  Sloka Volante Linnie Riches, MD 10/30/2023, 9:42 AM

## 2023-10-30 NOTE — Progress Notes (Signed)
   10/30/23 2045  Psych Admission Type (Psych Patients Only)  Admission Status Voluntary  Psychosocial Assessment  Patient Complaints Depression  Eye Contact Brief  Facial Expression Flat  Affect Depressed  Speech Logical/coherent  Interaction Guarded  Motor Activity Slow  Appearance/Hygiene Unremarkable  Behavior Characteristics Cooperative  Mood Suspicious;Preoccupied  Aggressive Behavior  Effect No apparent injury  Thought Process  Coherency WDL  Content WDL  Delusions WDL  Perception Hallucinations  Hallucination Auditory  Judgment Impaired  Confusion WDL  Danger to Self  Current suicidal ideation? Denies  Danger to Others  Danger to Others None reported or observed

## 2023-10-30 NOTE — Progress Notes (Signed)
  [facility] Adult Case Management Discharge Plan :  Will you be returning to the same living situation after discharge:  No. Pt is discharging to [facility] At discharge, do you have transportation home?: Yes,  CSW arranged [employer] taxi for 8:30AM 4/30 and pt given bus pass to get to f/u appts Do you have the ability to pay for your medications: No. Pt to receive samples at discharge  Release of information consent forms completed and in the chart;  Patient's signature needed at discharge.  Patient to Follow up at:  Follow-up Information     [facility]. Go on 11/06/2023.   Specialty: Behavioral Health Why: Please go to this provider on 11/06/23 at 7:00 am for an assessment, to obtain medication management services. You may also go on Monday through Friday, arrive no later than 7:00 am.  Please inform the provider that you do Not Have Any insurance. Contact information: [address] [place]  [phone]        [facility] Go on 11/05/2023.   Specialty: Professional Counselor Why: Please go to this provider on 11/05/22 at 9:00 am for an assessment, to obtain therapy services. You may also go on Monday through Friday, from 9 am to 1 pm. Contact information: [address]  [address] [place] [address] [phone]                 Next level of care provider has access to [facility] Link:no  Safety Planning and Suicide Prevention discussed: Yes,  Information given to patient     Has patient been referred to the Quitline?: Patient refused referral for treatment  Patient has been referred for addiction treatment: Patient refused referral for treatment.  Vonzell Guerin, LCSWA 10/30/2023, 3:38 PM

## 2023-10-30 NOTE — Group Note (Signed)
 Date:  10/30/2023 Time:  8:56 AM  Group Topic/Focus:  Goals Group:   The focus of this group is to help patients establish daily goals to achieve during treatment and discuss how the patient can incorporate goal setting into their daily lives to aide in recovery.    Participation Level:  Did Not Attend   Aaron Rubio 10/30/2023, 8:56 AM

## 2023-10-30 NOTE — BHH Group Notes (Signed)

## 2023-10-30 NOTE — Plan of Care (Signed)
   Problem: Education: Goal: Emotional status will improve Outcome: Progressing Goal: Mental status will improve Outcome: Progressing Goal: Verbalization of understanding the information provided will improve Outcome: Progressing

## 2023-10-30 NOTE — Group Note (Signed)
 Date:  10/30/2023 Time:  9:07 PM  Group Topic/Focus:  Wrap-Up Group:   The focus of this group is to help patients review their daily goal of treatment and discuss progress on daily workbooks.    Additional Comments:  Pt was encouraged, but opted out of attending wrap up group this evening.   Aaron Rubio 10/30/2023, 9:07 PM

## 2023-10-30 NOTE — Transportation (Signed)
 10/30/2023  Aaron Rubio DOB: Jun 28, 1981 MRN: [id number]   RIDER WAIVER AND RELEASE OF LIABILITY  For the purposes of helping with transportation needs, [facility] partners with outside transportation providers (taxi companies, [place], Catering manager.) to give [facility] patients or other approved people the choice of on-demand rides [employer]") to our buildings for non-emergency visits.  By using [employer], I, the person signing this document, on behalf of myself and/or any legal minors (in my care using the [employer]), agree:  Science writer given to me are supplied by independent, outside transportation providers who do not work for, or have any affiliation with, [employer]. [place] is not a transportation company. [facility] has no control over the quality or safety of the rides I get using [employer]. [place] has no control over whether any outside ride will happen on time or not. [place] gives no guarantee on the reliability, quality, safety, or availability on any rides, or that no mistakes will happen. I know and accept that traveling by vehicle (car, truck, SVU, [place] Chi, bus, taxi, etc.) has risks of serious injuries such as disability, being paralyzed, and death. I know and agree the risk of using [employer] is mine alone, and not [employer]. Transport Services are provided "as is" and as are available. The transportation providers are in charge for all inspections and care of the vehicles used to provide these rides. I agree not to take legal action against [place], its agents, employees, officers, directors, representatives, insurers, attorneys, assigns, successors, subsidiaries, and affiliates at any time for any reasons related directly or indirectly to using [employer]. I also agree not to take legal action against [place] or its affiliates for any injury, death, or damage to property caused by or related to  using [employer]. I have read this Waiver and Release of Liability, and I understand the terms used in it and their legal meaning. This Waiver is freely and voluntarily given with the understanding that my right (or any legal minors) to legal action against [place] relating to [employer] is knowingly given up to use these services.   I attest that I read the Ride Waiver and Release of Liability to Aaron Rubio, gave [name] the opportunity to ask questions and answered the questions asked (if any). I affirm that [name] then provided consent for assistance with transportation.

## 2023-10-30 NOTE — Progress Notes (Signed)
   10/30/23 0750  Psych Admission Type (Psych Patients Only)  Admission Status Voluntary  Psychosocial Assessment  Patient Complaints Depression  Eye Contact Brief  Facial Expression Flat  Affect Depressed  Speech Logical/coherent  Interaction Cautious;Guarded  Motor Activity Other (Comment) (WDL)  Appearance/Hygiene Unremarkable  Behavior Characteristics Cooperative  Mood Preoccupied;Anxious  Thought Process  Coherency WDL  Content WDL  Delusions WDL  Perception Hallucinations  Hallucination None reported or observed  Judgment Impaired  Confusion None  Danger to Self  Current suicidal ideation? Denies  Agreement Not to Harm Self Yes  Description of Agreement Verbal  Danger to Others  Danger to Others None reported or observed

## 2023-10-30 NOTE — Group Note (Signed)
 LCSW Group Therapy Note   Group Date: 10/30/2023 Start Time: 1100 End Time: 1200  Participation:  did not attend  Type of Therapy:  Group Therapy  Topic:  "Finding Balance: Using Wise Rubio for Thoughtful Decisions"  Objective:  the objective of this class is to help participants understand the concept of Wise Rubio and learn how to apply it to real-life situations to make balanced, thoughtful decisions. Participants will gain tools to manage emotions, consider logic, and find a middle ground that leads to healthier responses and outcomes.  Goals: Understand the concept of Wise Rubio.  Participants will learn the difference between Emotional Rubio, Reasonable Rubio, and Aaron Rubio, and how Aaron Rubio helps in balancing emotions and logic to make thoughtful decisions. Recognize when you're in Emotional Rubio or Reasonable Rubio.  Participants will identify the signs of Emotional Rubio and Reasonable Rubio in their own reactions to situations and understand how to move into Wise Rubio for more balanced responses. Practice applying Aaron Rubio to real-life situations.  Through scenarios and group activities, participants will practice using Wise Rubio in everyday situations, learning how to acknowledge their emotions, think logically, and create solutions that are thoughtful and balanced.  Summary: In this class, we explored the concept of Wise Rubio--the balance between Emotional Rubio and Reasonable Rubio. We discussed how Emotional Rubio can sometimes lead to impulsive, reactive decisions driven by intense feelings, and how Reasonable Rubio might ignore feelings altogether, focusing only on facts and logic. Aaron Rubio is the middle ground that combines both, allowing you to consider your emotions and use logic to make balanced, thoughtful decisions.  We learned how to recognize when we're in Emotional Rubio or Reasonable Rubio, and practiced using Wise Rubio in real-life situations. By using Aaron Rubio, we can improve  how we handle challenging situations, make better decisions, and strengthen our relationships with others.  Therapeutic Modalities: Elements of DBT - emotional regulation   Aaron Rubio, LCSWA 10/30/2023  1:17 PM

## 2023-10-31 DIAGNOSIS — F200 Paranoid schizophrenia: Secondary | ICD-10-CM | POA: Diagnosis present

## 2023-10-31 DIAGNOSIS — F203 Undifferentiated schizophrenia: Principal | ICD-10-CM

## 2023-10-31 NOTE — BHH Suicide Risk Assessment (Signed)
 [facility] Discharge Suicide Risk Assessment   Principal Problem: Undifferentiated schizophrenia [facility]) Discharge Diagnoses: Principal Problem:   Undifferentiated schizophrenia (HCC) Active Problems:   Adjustment disorder with mixed anxiety and depressed mood   Chronic paranoid schizophrenia (HCC)   Total Time spent with patient: 45 minutes  Reason for admission: This is the first psychiatric admission in this [facility] for this 43 year old African-American male with probable history of mental health issues/substance abuse issues as well. Patient was admitted to the [facility] from the [facility] with complaints of auditory hallucinations and suicidal ideations. He was admitted at the observational unit at [facility] where he was observed and treated for 4 days prior to his arrival to [facility] for further psychiatric evaluation/treatments. Patient current lab results has been reviewed.  His liver enzymes, both (AST & ALT) were elevated.  However, there were no results for BAL. His UDS was positive for cocaine, although patient denies any drug use including alcohol.  His answers to the assessment questions seem a bit evaded/guarded.   PTA Medications:  Medications Prior to Admission  Medication Sig Dispense Refill Last Dose/Taking   mirtazapine  (REMERON ) 15 MG tablet Take 1 tablet (15 mg total) by mouth at bedtime. 14 tablet 0     QUEtiapine  (SEROQUEL ) 25 MG tablet Take 1 tablet (25 mg total) by mouth 2 (two) times daily. 28 tablet 0      Hospital Course:   During the patient's hospitalization, patient had extensive initial psychiatric evaluation, and follow-up psychiatric evaluations every day.  Psychiatric diagnoses provided upon initial assessment: Undifferentiated schizophrenia.  Major depressive disorder with psychotic features.  Patient's psychiatric medications were adjusted on admission: -Initiated Seroquel  100 mg po Q bedtime for mood control.  -Initiated Fluoxetine  10 mg po daily for depression. -Continue Seroquel   25 mg po bid for day time agitation.  -Continue Trazodone  50 mg po Q bedtime for insomnia.  -Continue Hydroxyzine  25 mg po tid prn for anxiety.   During the hospitalization, other adjustments were made to the patient's psychiatric medication regimen: Seroquel  dose was adjusted to 25 mg twice daily and 100 mg at bedtime this is to help with psychosis, depression and sleep.  Prozac  was continued at 10 mg daily for depression and anxiety, Atarax  was continued 25 mg 3 times daily as needed for anxiety with occasional use, trazodone  was continued 50 mg at bedtime as needed for sleep used nightly during hospital stay.  Patient's care was discussed during the interdisciplinary team meeting every day during the hospitalization.  The patient denied having side effects to prescribed psychiatric medication.  Gradually, patient started adjusting to milieu. The patient was evaluated each day by a clinical provider to ascertain response to treatment. Improvement was noted by the patient's report of decreasing symptoms, improved sleep and appetite, affect, medication tolerance, behavior, and participation in unit programming.  Patient was asked each day to complete a self inventory noting mood, mental status, pain, new symptoms, anxiety and concerns.   During hospital stay patient did continue at a certain level to be isolated but became more interactive in the milieu and attending more groups with some participation noted.  He did deny any craving and he did continue to present with improvement, presented futuristic and goal oriented noting plan to go to [facility] office and ensure reestablishing Medicaid after discharge.  He did agree to comply with medications after discharge as well as outpatient follow-up appointments.  During hospital stay he denied craving to cocaine or illicit drugs in general and agreed to comply  with recommendation to abstain completely after discharge. Symptoms were reported as significantly  decreased or resolved completely by discharge.   On day of discharge, patient was evaluated on 10/31/2023 the patient reports that their mood is stable. The patient denied having suicidal thoughts for more than 48 hours prior to discharge.  Patient denies having homicidal thoughts.  Patient denies having auditory hallucinations.  Patient denies any visual hallucinations or other symptoms of psychosis. The patient was motivated to continue taking medication with a goal of continued improvement in mental health.   The patient reports their target psychiatric symptoms of depression and psychosis responded well to the psychiatric medications, and the patient reports overall benefit other psychiatric hospitalization. Supportive psychotherapy was provided to the patient. The patient also participated in regular group therapy while hospitalized. Coping skills, problem solving as well as relaxation therapies were also part of the unit programming.  Labs were reviewed with the patient, and abnormal results were discussed with the patient.  The patient is able to verbalize their individual safety plan to this provider.  Behavioral Events: None  Restraints: None  Groups: Attended and participated  Medications Changes: As above  Sleep  Fair, improved during hospital stay  Musculoskeletal: Strength & Muscle Tone: within normal limits Gait & Station: normal Patient leans: N/A  Psychiatric Specialty Exam  General Appearance: appears at stated age, fairly dressed and groomed  Behavior: pleasant and cooperative  Psychomotor Activity:No psychomotor agitation or retardation noted   Eye Contact: good Speech: normal amount, tone, volume and latency   Mood: euthymic Affect: congruent, pleasant and interactive  Thought Process: linear, goal directed, no circumstantial or tangential thought process noted, no racing thoughts or flight of ideas Descriptions of Associations: intact Thought  Content: Hallucinations: denies AH, VH , does not appear responding to stimuli Delusions: No paranoia or other delusions noted Suicidal Thoughts: denies SI, intention, plan  Homicidal Thoughts: denies HI, intention, plan   Alertness/Orientation: alert and fully oriented  Insight: fair, improved Judgment: fair, improved  Memory: intact  Executive Functions  Concentration: intact  Attention Span: Fair Recall: intact Fund of Knowledge: fair   Art therapist  Concentration: intact Attention Span: Fair Recall: intact Fund of Knowledge: fair   Assets  Assets: Desire for Improvement; Manufacturing systems engineer; Physical Health   Physical Exam: Physical Exam ROS Blood pressure (!) 142/114, pulse 85, temperature 98.2 F (36.8 C), temperature source Oral, resp. rate 12, height 6\' 2"  (1.88 m), weight 98.9 kg, SpO2 100%. Body mass index is 27.99 kg/m.  Mental Status Per Nursing Assessment::   On Admission:  Suicidal ideation indicated by others  Demographic Factors:  Male, Low socioeconomic status, Living alone, and Unemployed  Loss Factors: NA  Historical Factors: Impulsivity  Risk Reduction Factors:   NA  Continued Clinical Symptoms: Symptoms improved significantly during hospital stay Depression:   Anhedonia Hopelessness Impulsivity Schizophrenia:   Depressive state Paranoid or undifferentiated type  Cognitive Features That Contribute To Risk:  Closed-mindedness    Suicide Risk:  Minimal: No identifiable suicidal ideation.  Patients presenting with no risk factors but with morbid ruminations; may be classified as minimal risk based on the severity of the depressive symptoms   Follow-up Information     [facility]. Go on 11/06/2023.   Specialty: Behavioral Health Why: Please go to this provider on 11/06/23 at 7:00 am for an assessment, to obtain medication management services. You may also go on Monday through Friday, arrive no later  than 7:00 am.  Please inform the provider that you do Not Have Any insurance. Contact information: [address] [place]  [phone]        [facility] Go on 11/05/2023.   Specialty: Professional Counselor Why: Please go to this provider on 11/05/22 at 9:00 am for an assessment, to obtain therapy services. You may also go on Monday through Friday, from 9 am to 1 pm. Contact information: [address]  [address] [place] [address] [phone]                 Plan Of Care/Follow-up recommendations:   Discharge recommendations:    Activity: as tolerated  Diet: heart healthy  # It is recommended to the patient to continue psychiatric medications as prescribed, after discharge from the hospital.     # It is recommended to the patient to follow up with your outpatient psychiatric provider and PCP.   # It was discussed with the patient, the impact of alcohol, drugs, tobacco have been there overall psychiatric and medical wellbeing, and total abstinence from substance use was recommended the patient.ed.   # Prescriptions provided or sent directly to preferred pharmacy at discharge. Patient agreeable to plan. Given opportunity to ask questions. Appears to feel comfortable with discharge.    # In the event of worsening symptoms, the patient is instructed to call the crisis hotline, 911 and or go to the nearest ED for appropriate evaluation and treatment of symptoms. To follow-up with primary care provider for other medical issues, concerns and or health care needs   # Patient was discharged home with a plan to follow up as noted above.   Patient agrees with D/C instructions and plan.  The patient received suicide prevention pamphlet:  Yes Belongings returned:  Clothing and Valuables  Total Time Spent in Direct Patient Care:  I personally spent 45 minutes on the unit in direct patient care. The direct patient care time included  face-to-face time with the patient, reviewing the patient's chart, communicating with other professionals, and coordinating care. Greater than 50% of this time was spent in counseling or coordinating care with the patient regarding goals of hospitalization, psycho-education, and discharge planning needs.     Wilmer Santillo Linnie Riches, MD 10/31/2023, 7:50 AM

## 2023-10-31 NOTE — Progress Notes (Signed)
 Patient ID: Aaron Rubio, male   DOB: 10-Sep-1980, 43 y.o.   MRN: [id number] Medications, prescriptions, follow up appointment and discharge instruction reviewed, patient verbalized understanding. Belongings returned, patient discharged via taxi.

## 2023-10-31 NOTE — Discharge Summary (Signed)
 Physician Discharge Summary Note  Patient:  Aaron Rubio is an 43 y.o., male MRN:  [id number] DOB:  08-20-1980 Patient phone:  [phone] (home)  Patient address:   [place] [address],  Total Time spent with patient: 45 minutes  Date of Admission:  10/25/2023 Date of Discharge: 10/31/23  Reason for Admission:  This is the first psychiatric admission in this [facility] for this 43 year old African-American male with probable history of mental health issues/substance abuse issues as well. Patient was admitted to the [facility] from the [facility] with complaints of auditory hallucinations and suicidal ideations. He was admitted at the observational unit at [facility] where he was observed and treated for 4 days prior to his arrival to [facility] for further psychiatric evaluation/treatments. Patient current lab results has been reviewed. His liver enzymes, both (AST & ALT) were elevated. However, there were no results for BAL. His UDS was positive for cocaine, although patient denies any drug use including alcohol. His answers to the assessment questions seem a bit evaded/guarded.   Principal Problem: Undifferentiated schizophrenia [facility]) Discharge Diagnoses: Principal Problem:   Undifferentiated schizophrenia (HCC) Active Problems:   Adjustment disorder with mixed anxiety and depressed mood   Chronic paranoid schizophrenia (HCC)   Past Psychiatric History:  History of diagnosis schizophrenia with medication treatment but lack of compliance with outpatient follow-up and outpatient medications.  No previous hospitalizations.  Past Medical History:  Past Medical History:  Diagnosis Date   Hypertension    Schizophrenia (HCC)    History reviewed. No pertinent surgical history.  Family History: History reviewed. No pertinent family history. Family Psychiatric  History:  Unknown Social History:  Social History   Substance and Sexual Activity  Alcohol Use Not Currently     Social History    Substance and Sexual Activity  Drug Use Not Currently    Social History   Socioeconomic History   Marital status: Single    Spouse name: Not on file   Number of children: Not on file   Years of education: Not on file   Highest education level: Not on file  Occupational History   Not on file  Tobacco Use   Smoking status: Every Day    Current packs/day: 0.30    Types: Cigarettes   Smokeless tobacco: Not on file  Substance and Sexual Activity   Alcohol use: Not Currently   Drug use: Not Currently   Sexual activity: Not on file  Other Topics Concern   Not on file  Social History Narrative   Not on file   Social Drivers of Health   Financial Resource Strain: Not on file  Food Insecurity: Food Insecurity Present (10/25/2023)   Hunger Vital Sign    Worried About Running Out of Food in the Last Year: Often true    Ran Out of Food in the Last Year: Often true  Transportation Needs: No Transportation Needs (10/25/2023)   PRAPARE - Administrator, Civil Service (Medical): No    Lack of Transportation (Non-Medical): No  Physical Activity: Not on file  Stress: Not on file  Social Connections: Unknown (05/08/2022)   Received from [facility]   Social Network    Social Network: Not on file    Substance Use History:  Admitted to using cocaine sporadically but denied any drug use otherwise.  Hospital Course:   During the patient's hospitalization, patient had extensive initial psychiatric evaluation, and follow-up psychiatric evaluations every day.   Psychiatric diagnoses provided upon initial assessment: Undifferentiated  schizophrenia.  Major depressive disorder with psychotic features.   Patient's psychiatric medications were adjusted on admission: -Initiated Seroquel  100 mg po Q bedtime for mood control.  -Initiated Fluoxetine  10 mg po daily for depression. -Continue Seroquel  25 mg po bid for day time agitation.  -Continue Trazodone  50 mg po Q bedtime for  insomnia.  -Continue Hydroxyzine  25 mg po tid prn for anxiety.    During the hospitalization, other adjustments were made to the patient's psychiatric medication regimen: Seroquel  dose was adjusted to 25 mg twice daily and 100 mg at bedtime this is to help with psychosis, depression and sleep.  Prozac  was continued at 10 mg daily for depression and anxiety, Atarax  was continued 25 mg 3 times daily as needed for anxiety with occasional use, trazodone  was continued 50 mg at bedtime as needed for sleep used nightly during hospital stay.   Patient's care was discussed during the interdisciplinary team meeting every day during the hospitalization.   The patient denied having side effects to prescribed psychiatric medication.   Gradually, patient started adjusting to milieu. The patient was evaluated each day by a clinical provider to ascertain response to treatment. Improvement was noted by the patient's report of decreasing symptoms, improved sleep and appetite, affect, medication tolerance, behavior, and participation in unit programming.  Patient was asked each day to complete a self inventory noting mood, mental status, pain, new symptoms, anxiety and concerns.   During hospital stay patient did continue at a certain level to be isolated but became more interactive in the milieu and attending more groups with some participation noted.  He did deny any craving and he did continue to present with improvement, presented futuristic and goal oriented noting plan to go to [facility] office and ensure reestablishing Medicaid after discharge.  He did agree to comply with medications after discharge as well as outpatient follow-up appointments.  During hospital stay he denied craving to cocaine or illicit drugs in general and agreed to comply with recommendation to abstain completely after discharge. Symptoms were reported as significantly decreased or resolved completely by discharge.    On day of discharge, patient was  evaluated on 10/31/2023 the patient reports that their mood is stable. The patient denied having suicidal thoughts for more than 48 hours prior to discharge.  Patient denies having homicidal thoughts.  Patient denies having auditory hallucinations.  Patient denies any visual hallucinations or other symptoms of psychosis. The patient was motivated to continue taking medication with a goal of continued improvement in mental health.    The patient reports their target psychiatric symptoms of depression and psychosis responded well to the psychiatric medications, and the patient reports overall benefit other psychiatric hospitalization. Supportive psychotherapy was provided to the patient. The patient also participated in regular group therapy while hospitalized. Coping skills, problem solving as well as relaxation therapies were also part of the unit programming.   Labs were reviewed with the patient, and abnormal results were discussed with the patient.   The patient is able to verbalize their individual safety plan to this provider.   Behavioral Events: None   Restraints: None   Groups: Attended and participated   Medications Changes: As above   Sleep  Fair, improved during hospital stay  Physical Findings: AIMS: Facial and Oral Movements Muscles of Facial Expression: None Lips and Perioral Area: None Jaw: None Tongue: None,Extremity Movements Upper (arms, wrists, hands, fingers): None Lower (legs, knees, ankles, toes): None, Trunk Movements Neck, shoulders, hips: None, Global Judgements Severity  of abnormal movements overall : None Incapacitation due to abnormal movements: None Patient's awareness of abnormal movements: No Awareness, Dental Status Current problems with teeth and/or dentures?: No Does patient usually wear dentures?: No Edentia?: No  CIWA:    COWS:     Musculoskeletal: Strength & Muscle Tone: within normal limits Gait & Station: normal Patient leans:  N/A   Psychiatric Specialty Exam:  General Appearance: appears at stated age, fairly dressed and groomed  Behavior: pleasant and cooperative  Psychomotor Activity:No psychomotor agitation or retardation noted   Eye Contact: good Speech: normal amount, tone, volume and latency   Mood: euthymic Affect: congruent, pleasant and interactive  Thought Process: linear, goal directed, no circumstantial or tangential thought process noted, no racing thoughts or flight of ideas Descriptions of Associations: intact Thought Content: Hallucinations: denies AH, VH , does not appear responding to stimuli Delusions: No paranoia or other delusions noted Suicidal Thoughts: denies SI, intention, plan  Homicidal Thoughts: denies HI, intention, plan   Alertness/Orientation: alert and fully oriented  Insight: fair, improved Judgment: fair, improved  Memory: intact  Executive Functions  Concentration: intact  Attention Span: Fair Recall: intact Fund of Knowledge: fair   Assets  Assets: Desire for Improvement; Manufacturing systems engineer; Physical Health     Physical Exam:  Physical Exam Vitals and nursing note reviewed.  Constitutional:      Appearance: Normal appearance. He is normal weight.  HENT:     Head: Normocephalic and atraumatic.     Nose: Nose normal.  Eyes:     Extraocular Movements: Extraocular movements intact.  Pulmonary:     Effort: Pulmonary effort is normal.  Musculoskeletal:        General: Normal range of motion.     Cervical back: Normal range of motion.  Neurological:     General: No focal deficit present.     Mental Status: He is alert and oriented to person, place, and time. Mental status is at baseline.  Psychiatric:        Mood and Affect: Mood normal.        Behavior: Behavior normal.        Thought Content: Thought content normal.        Judgment: Judgment normal.    Review of Systems  All other systems reviewed and are negative.  Blood pressure  118/78, pulse 78, temperature 98.2 F (36.8 C), temperature source Oral, resp. rate 12, height 6\' 2"  (1.88 m), weight 98.9 kg, SpO2 100%. Body mass index is 27.99 kg/m.   Social History   Tobacco Use  Smoking Status Every Day   Current packs/day: 0.30   Types: Cigarettes  Smokeless Tobacco Not on file   Tobacco Cessation:  A prescription for an FDA-approved tobacco cessation medication was offered at discharge and the patient refused   Blood Alcohol level:  Lab Results  Component Value Date   [facility] <10 05/17/2023   ETH <10 05/15/2023    Metabolic Disorder Labs:  Lab Results  Component Value Date   HGBA1C 5.0 10/27/2023   MPG 96.8 10/27/2023   No results found for: "PROLACTIN" Lab Results  Component Value Date   CHOL 152 10/19/2023   TRIG 144 10/19/2023   HDL 57 10/19/2023   CHOLHDL 2.7 10/19/2023   VLDL 29 10/19/2023   LDLCALC 66 10/19/2023    See Psychiatric Specialty Exam and Suicide Risk Assessment completed by Attending Physician prior to discharge.  Discharge destination:  Other:  To homeless shelter  Is patient on multiple  antipsychotic therapies at discharge:  No   Has Patient had three or more failed trials of antipsychotic monotherapy by history:  No  Recommended Plan for Multiple Antipsychotic Therapies: NA  Discharge Instructions     Diet - low sodium heart healthy   Complete by: As directed    Increase activity slowly   Complete by: As directed       Allergies as of 10/31/2023   No Known Allergies      Medication List     STOP taking these medications    mirtazapine  15 MG tablet Commonly known as: REMERON        TAKE these medications      Indication  FLUoxetine  10 MG capsule Commonly known as: PROZAC  Take 1 capsule (10 mg total) by mouth daily.  Indication: Major Depressive Disorder   hydrOXYzine  25 MG tablet Commonly known as: ATARAX  Take 1 tablet (25 mg total) by mouth 3 (three) times daily as needed for anxiety.   Indication: Feeling Anxious   QUEtiapine  200 MG tablet Commonly known as: SEROQUEL  Take 1 tablet (200 mg total) by mouth at bedtime. What changed:  medication strength how much to take when to take this  Indication: Major Depressive Disorder, Mood control   QUEtiapine  25 MG tablet Commonly known as: SEROQUEL  Take 1 tablet (25 mg total) by mouth 2 (two) times daily. What changed: You were already taking a medication with the same name, and this prescription was added. Make sure you understand how and when to take each.  Indication: Major Depressive Disorder, Schizophrenia, Mood stabilization   traZODone  50 MG tablet Commonly known as: DESYREL  Take 1 tablet (50 mg total) by mouth at bedtime as needed for sleep.  Indication: Trouble Sleeping        Follow-up Information     [facility]. Go on 11/06/2023.   Specialty: Behavioral Health Why: Please go to this provider on 11/06/23 at 7:00 am for an assessment, to obtain medication management services. You may also go on Monday through Friday, arrive no later than 7:00 am.  Please inform the provider that you do Not Have Any insurance. Contact information: [address] [place]  [address] [phone]        [facility] Go on 11/05/2023.   Specialty: Professional Counselor Why: Please go to this provider on 11/05/22 at 9:00 am for an assessment, to obtain therapy services. You may also go on Monday through Friday, from 9 am to 1 pm. Contact information: [address] [place]  [address] [place] [address] [phone]                 Discharge recommendations:   Activity: as tolerated  Diet: heart healthy  # It is recommended to the patient to continue psychiatric medications as prescribed, after discharge from the hospital.     # It is recommended to the patient to follow up with your outpatient psychiatric provider and PCP.   # It was discussed with the  patient, the impact of alcohol, drugs, tobacco have been there overall psychiatric and medical wellbeing, and total abstinence from substance use was recommended the patient.ed.   # Prescriptions provided or sent directly to preferred pharmacy at discharge. Patient agreeable to plan. Given opportunity to ask questions. Appears to feel comfortable with discharge.    # In the event of worsening symptoms, the patient is instructed to call the crisis hotline, 911 and or go to the nearest ED for appropriate evaluation and  treatment of symptoms. To follow-up with primary care provider for other medical issues, concerns and or health care needs   # Patient was discharged home with a plan to follow up as noted above.   Patient agrees with D/C instructions and plan.   The patient received suicide prevention pamphlet:  Yes Belongings returned:  Clothing and Valuables  Total Time Spent in Direct Patient Care:  I personally spent 45 minutes on the unit in direct patient care. The direct patient care time included face-to-face time with the patient, reviewing the patient's chart, communicating with other professionals, and coordinating care. Greater than 50% of this time was spent in counseling or coordinating care with the patient regarding goals of hospitalization, psycho-education, and discharge planning needs.    SignedAlver Jobs, MD 10/31/2023, 7:57 AM

## 2023-10-31 NOTE — Progress Notes (Signed)
   10/31/23 0800  Psych Admission Type (Psych Patients Only)  Admission Status Voluntary  Psychosocial Assessment  Patient Complaints None  Eye Contact Brief  Facial Expression Flat  Affect Depressed  Speech Logical/coherent  Interaction Guarded  Motor Activity Other (Comment) (WNL)  Appearance/Hygiene Unremarkable  Behavior Characteristics Cooperative  Mood Suspicious;Preoccupied  Thought Process  Coherency WDL  Content WDL  Delusions None reported or observed  Perception WDL  Hallucination None reported or observed  Judgment Poor  Confusion WDL  Danger to Self  Current suicidal ideation? Denies  Agreement Not to Harm Self Yes  Description of Agreement verbal;  Danger to Others  Danger to Others None reported or observed

## 2023-10-31 NOTE — Plan of Care (Signed)
  Problem: Education: Goal: Knowledge of [place] [facility] Education information/materials will improve Outcome: Completed/Met Goal: Emotional status will improve Outcome: Completed/Met Goal: Mental status will improve Outcome: Completed/Met Goal: Verbalization of understanding the information provided will improve Outcome: Completed/Met   Problem: Activity: Goal: Interest or engagement in activities will improve Outcome: Completed/Met Goal: Sleeping patterns will improve Outcome: Completed/Met   Problem: Coping: Goal: Ability to verbalize frustrations and anger appropriately will improve Outcome: Completed/Met Goal: Ability to demonstrate self-control will improve Outcome: Completed/Met

## 2023-11-02 ENCOUNTER — Emergency Department (HOSPITAL_COMMUNITY)
Admission: EM | Admit: 2023-11-02 | Discharge: 2023-11-02 | Disposition: A | Discharge status: Other Acute Inpatient Hospital | Payer: Self-pay | Attending: Emergency Medicine | Admitting: Emergency Medicine

## 2023-11-02 ENCOUNTER — Ambulatory Visit (HOSPITAL_COMMUNITY)
Admission: EM | Admit: 2023-11-02 | Discharge: 2023-11-02 | Disposition: A | Discharge status: Other Acute Inpatient Hospital | Attending: Nurse Practitioner | Admitting: Nurse Practitioner

## 2023-11-02 DIAGNOSIS — Z5900 Homelessness unspecified: Secondary | ICD-10-CM | POA: Diagnosis not present

## 2023-11-02 DIAGNOSIS — R45851 Suicidal ideations: Secondary | ICD-10-CM | POA: Diagnosis not present

## 2023-11-02 DIAGNOSIS — M549 Dorsalgia, unspecified: Secondary | ICD-10-CM

## 2023-11-02 DIAGNOSIS — F200 Paranoid schizophrenia: Secondary | ICD-10-CM | POA: Diagnosis present

## 2023-11-02 DIAGNOSIS — F201 Disorganized schizophrenia: Secondary | ICD-10-CM

## 2023-11-02 DIAGNOSIS — M545 Low back pain, unspecified: Secondary | ICD-10-CM | POA: Diagnosis present

## 2023-11-02 DIAGNOSIS — Z79899 Other long term (current) drug therapy: Secondary | ICD-10-CM | POA: Diagnosis not present

## 2023-11-02 DIAGNOSIS — G8929 Other chronic pain: Secondary | ICD-10-CM

## 2023-11-02 DIAGNOSIS — Z765 Malingerer [conscious simulation]: Secondary | ICD-10-CM | POA: Diagnosis not present

## 2023-11-02 DIAGNOSIS — X58XXXA Exposure to other specified factors, initial encounter: Secondary | ICD-10-CM | POA: Diagnosis not present

## 2023-11-02 DIAGNOSIS — I10 Essential (primary) hypertension: Secondary | ICD-10-CM | POA: Insufficient documentation

## 2023-11-02 DIAGNOSIS — S39012A Strain of muscle, fascia and tendon of lower back, initial encounter: Secondary | ICD-10-CM | POA: Diagnosis not present

## 2023-11-02 DIAGNOSIS — F29 Unspecified psychosis not due to a substance or known physiological condition: Secondary | ICD-10-CM | POA: Diagnosis present

## 2023-11-02 LAB — CBC WITH DIFFERENTIAL/PLATELET
Abs Immature Granulocytes: 0.07 10*3/uL (ref 0.00–0.07)
Basophils Absolute: 0.1 10*3/uL (ref 0.0–0.1)
Basophils Relative: 1 %
Eosinophils Absolute: 0.7 10*3/uL — ABNORMAL HIGH (ref 0.0–0.5)
Eosinophils Relative: 6 %
HCT: 42.6 % (ref 39.0–52.0)
Hemoglobin: 13.5 g/dL (ref 13.0–17.0)
Immature Granulocytes: 1 %
Lymphocytes Relative: 20 %
Lymphs Abs: 2.1 10*3/uL (ref 0.7–4.0)
MCH: 26.6 pg (ref 26.0–34.0)
MCHC: 31.7 g/dL (ref 30.0–36.0)
MCV: 83.9 fL (ref 80.0–100.0)
Monocytes Absolute: 1.4 10*3/uL — ABNORMAL HIGH (ref 0.1–1.0)
Monocytes Relative: 13 %
Neutro Abs: 6.3 10*3/uL (ref 1.7–7.7)
Neutrophils Relative %: 59 %
Platelets: 203 10*3/uL (ref 150–400)
RBC: 5.08 MIL/uL (ref 4.22–5.81)
RDW: 13.5 % (ref 11.5–15.5)
WBC: 10.6 10*3/uL — ABNORMAL HIGH (ref 4.0–10.5)
nRBC: 0 % (ref 0.0–0.2)

## 2023-11-02 LAB — COMPREHENSIVE METABOLIC PANEL WITH GFR
ALT: 31 U/L (ref 0–44)
AST: 51 U/L — ABNORMAL HIGH (ref 15–41)
Albumin: 4.2 g/dL (ref 3.5–5.0)
Alkaline Phosphatase: 52 U/L (ref 38–126)
Anion gap: 11 (ref 5–15)
BUN: 28 mg/dL — ABNORMAL HIGH (ref 6–20)
CO2: 25 mmol/L (ref 22–32)
Calcium: 9.4 mg/dL (ref 8.9–10.3)
Chloride: 101 mmol/L (ref 98–111)
Creatinine, Ser: 1.21 mg/dL (ref 0.61–1.24)
GFR, Estimated: >60 mL/min (ref >60)
Glucose, Bld: 75 mg/dL (ref 70–99)
Potassium: 3.6 mmol/L (ref 3.5–5.1)
Sodium: 137 mmol/L (ref 135–145)
Total Bilirubin: 1.2 mg/dL (ref 0.0–1.2)
Total Protein: 7.3 g/dL (ref 6.5–8.1)

## 2023-11-02 LAB — RAPID URINE DRUG SCREEN, HOSP PERFORMED
Amphetamines: NOT DETECTED
Barbiturates: NOT DETECTED
Benzodiazepines: NOT DETECTED
Cocaine: POSITIVE — AB
Opiates: NOT DETECTED
Tetrahydrocannabinol: POSITIVE — AB

## 2023-11-02 LAB — ETHANOL: Alcohol, Ethyl (B): <15 mg/dL (ref <15)

## 2023-11-02 MED ORDER — CLONIDINE HCL 0.1 MG PO TABS
0.1000 mg | ORAL_TABLET | Freq: Once | ORAL | Status: DC
  Filled 2023-11-02: qty 1

## 2023-11-02 MED ORDER — HYDROXYZINE HCL 25 MG PO TABS: 25.0000 mg | ORAL_TABLET | Freq: Three times a day (TID) | ORAL | Status: DC | PRN

## 2023-11-02 MED ORDER — QUETIAPINE FUMARATE 25 MG PO TABS: 25.0000 mg | ORAL_TABLET | Freq: Two times a day (BID) | ORAL | Status: DC

## 2023-11-02 MED ORDER — IBUPROFEN 200 MG PO TABS
600.0000 mg | ORAL_TABLET | Freq: Once | ORAL | Status: AC
  Administered 2023-11-02: 600 mg via ORAL
  Filled 2023-11-02: qty 3

## 2023-11-02 MED ORDER — CYCLOBENZAPRINE HCL 10 MG PO TABS: 5.0000 mg | ORAL_TABLET | Freq: Every evening | ORAL | 0 refills | Status: DC | PRN

## 2023-11-02 MED ORDER — QUETIAPINE FUMARATE 100 MG PO TABS
100.0000 mg | ORAL_TABLET | Freq: Every day | ORAL | Status: DC
  Filled 2023-11-02: qty 1

## 2023-11-02 MED ORDER — FLUOXETINE HCL 10 MG PO CAPS: 10.0000 mg | ORAL_CAPSULE | Freq: Every day | ORAL | Status: DC

## 2023-11-02 MED ORDER — QUETIAPINE FUMARATE 25 MG PO TABS
25.0000 mg | ORAL_TABLET | ORAL | Status: DC
  Filled 2023-11-02: qty 1

## 2023-11-02 MED ORDER — TRAZODONE HCL 50 MG PO TABS: 50.0000 mg | ORAL_TABLET | Freq: Every evening | ORAL | Status: DC | PRN

## 2023-11-02 MED ORDER — ACETAMINOPHEN 325 MG PO TABS: 650.0000 mg | ORAL_TABLET | ORAL | Status: DC | PRN

## 2023-11-02 NOTE — ED Notes (Addendum)
Report called to Natalie RN

## 2023-11-02 NOTE — Progress Notes (Signed)
 Pt has been accepted to [facility] on 11/02/2023 Bed assignment: 505-1  Pt meets inpatient criteria per: Herman Longs NP  Attending Physician will be: Dr. Alver Jobs, MD   Report can be called to: Adult unit: [phone]  Pt can arrive after 4 PM TODAY   Care Team Notified: [facility] RN, Patt Boozer NT, Sela Daft NP, Willeen Harold RN    [place] Parker Wherley LCSW-A   11/02/2023 1:00 PM

## 2023-11-02 NOTE — ED Notes (Signed)
 Patient refused to take Clonidine  0.1 mg PO. Herman Longs, NP informed and no new orders received.

## 2023-11-02 NOTE — ED Provider Notes (Addendum)
 [place] EMERGENCY DEPARTMENT AT [facility] Provider Note   CSN: [id number] Arrival date & time: 11/02/23  1027     History  Chief Complaint  Patient presents with   Back Pain    L   Suicidal   Hypertension    Aaron Rubio is a 43 y.o. male with history of hypertension, schizophrenia, presents with concern for lower back pain that has been ongoing for about the past week.  States he has been walking a lot, and then this pain started. Pain is worse with movement and sitting long periods of time.  He denies any bowel or bladder incontinence, saddle anesthesia, pain going down the legs, fevers, IV drug use, or history of malignancy.  Denies any falls or injuries to the area.  Patient came from [facility] where he was being evaluated for uncooperative behaviors. Denies any SI or HI. Denies any auditory or visual hallucinations.    Back Pain Hypertension       Home Medications Prior to Admission medications   Medication Sig Start Date End Date Taking? Authorizing Provider  cyclobenzaprine  (FLEXERIL ) 10 MG tablet Take 0.5-1 tablets (5-10 mg total) by mouth at bedtime as needed for muscle spasms. 11/02/23  Yes Rexie Catena, PA-C  FLUoxetine  (PROZAC ) 10 MG capsule Take 1 capsule (10 mg total) by mouth daily. 10/31/23  Yes Alver Jobs, MD  hydrOXYzine  (ATARAX ) 25 MG tablet Take 1 tablet (25 mg total) by mouth 3 (three) times daily as needed for anxiety. 10/30/23  Yes Linnie Riches, Nadir, MD  QUEtiapine  (SEROQUEL ) 200 MG tablet Take 1 tablet (200 mg total) by mouth at bedtime. 10/30/23   Alver Jobs, MD  QUEtiapine  (SEROQUEL ) 25 MG tablet Take 1 tablet (25 mg total) by mouth 2 (two) times daily. 10/30/23   Alver Jobs, MD  traZODone  (DESYREL ) 50 MG tablet Take 1 tablet (50 mg total) by mouth at bedtime as needed for sleep. 10/30/23   Alver Jobs, MD      Allergies    Patient has no known allergies.    Review of Systems   Review of Systems  Musculoskeletal:  Positive  for back pain.    Physical Exam Updated Vital Signs BP (!) 126/94 (BP Location: Left Arm)   Pulse 74   Temp 97.7 F (36.5 C) (Oral)   Resp 20   SpO2 100%  Physical Exam Vitals and nursing note reviewed.  Constitutional:      General: He is not in acute distress.    Appearance: He is well-developed.  HENT:     Head: Normocephalic and atraumatic.  Eyes:     Conjunctiva/sclera: Conjunctivae normal.  Cardiovascular:     Rate and Rhythm: Normal rate and regular rhythm.     Heart sounds: No murmur heard.    Comments: Pedal pulses 2+ bilaterally Pulmonary:     Effort: Pulmonary effort is normal. No respiratory distress.     Breath sounds: Normal breath sounds.  Abdominal:     Palpations: Abdomen is soft.     Tenderness: There is no abdominal tenderness.  Musculoskeletal:        General: No swelling.     Cervical back: Neck supple.     Comments: No cervical, thoracic, or lumbar spinal tenderness to palpation Mild tenderness palpation in the musculature of the lumbar region  Moves all extremities without difficulty.  Ambulates without difficulty  Skin:    General: Skin is warm and dry.     Capillary Refill: Capillary refill takes less  than 2 seconds.  Neurological:     Mental Status: He is alert.     Comments: 5/5 strength with resisted hip flexion and extension, knee flexion and extension, ankle plantarflexion and dorsiflexion bilaterally   Intact sensation in the bilateral lower extremities  Psychiatric:        Mood and Affect: Mood normal.     ED Results / Procedures / Treatments   Labs (all labs ordered are listed, but only abnormal results are displayed) Labs Reviewed  COMPREHENSIVE METABOLIC PANEL WITH GFR - Abnormal; Notable for the following components:      Result Value   BUN 28 (*)    AST 51 (*)    All other components within normal limits  CBC WITH DIFFERENTIAL/PLATELET - Abnormal; Notable for the following components:   WBC 10.6 (*)    Monocytes  Absolute 1.4 (*)    Eosinophils Absolute 0.7 (*)    All other components within normal limits  ETHANOL  RAPID URINE DRUG SCREEN, HOSP PERFORMED    EKG None  Radiology No results found.  Procedures Procedures    Medications Ordered in ED Medications  acetaminophen  (TYLENOL ) tablet 650 mg (has no administration in time range)  FLUoxetine  (PROZAC ) capsule 10 mg (has no administration in time range)  hydrOXYzine  (ATARAX ) tablet 25 mg (has no administration in time range)  traZODone  (DESYREL ) tablet 50 mg (has no administration in time range)  ibuprofen  (ADVIL ) tablet 600 mg (600 mg Oral Given 11/02/23 0724)    ED Course/ Medical Decision Making/ A&P                                 Medical Decision Making Amount and/or Complexity of Data Reviewed Labs: ordered.  Risk OTC drugs. Prescription drug management.     Differential diagnosis includes but is not limited to Musculoskeletal pain, radiculopathy, spinal stenosis, herniated nucleus pulposis, fracture, cauda equina, epidural abscess, shingles, nephrolithiasis    ED Course:  Upon initial evaluation, patient is well-appearing, stable vital signs aside from elevated blood pressure 174/98.  Afebrile, no tachycardia.  Reporting lower back pain ongoing for the past week.  Reporting he has been walking a lot and the back pain symptoms start.  Denies any falls, no cervical, thoracic, or lumbar spinal tenderness palpation, with concern for vertebral fracture or injury.  Denying any radicular symptoms, low concern for herniated nucleus pulposis. No red flag back pain signs.  No indication for imaging at this time. Back pain is reproducible with palpation of the musculature.  Neurovascularly intact in the lower extremity.  Able to walk without difficulty.  Suspect musculoskeletal pain. Although he was sent in [facility], he is denying any suicidal or homicidal ideation.  Answering questions appropriately, although sometimes gets off topic. No  indication for TTS consult at this time.     Medications Given: Ibuprofen  given for pain  Impression: Musculoskeletal lower back pain  Disposition:  The patient was discharged home with instructions to take Tylenol  and ibuprofen  as needed for pain.  Flexeril  prescribed to take before bed as needed for his back pain.  Follow-up with his PCP within the next 2 weeks regarding his high blood pressure today and back pain if not improved. Return precautions given.   This chart was dictated using voice recognition software, Dragon. Despite the best efforts of this provider to proofread and correct errors, errors may still occur which can change documentation meaning.  Addendum 7:43 AM.  Upon discharge patient, nurse notified me that patient reports he has suicidal ideation.  I went to talk to the patient and he says he has "suicidal ideation" but does not elaborate any further.  When asked if he has any plans to harm or kill himself, he states "can I get some food" and then talks about off topic things. I have high suspicion for malingering, but since his thought process is not completely logical, will have him evaluated by TTS for possible manic episode.  Labs were ordered and independently reviewed by me, CBC and CMP unremarkable.  Ethanol level undetectable.  Awaiting UDS from patient.  Patient is medically cleared for psychiatry/TTS consult at this time.  Home medications have been reordered.   Final Clinical Impression(s) / ED Diagnoses Final diagnoses:  Low back strain, initial encounter    Rx / DC Orders ED Discharge Orders          Ordered    cyclobenzaprine  (FLEXERIL ) 10 MG tablet  At bedtime PRN        11/02/23 0708              Rexie Catena, PA-C 11/02/23 0725    Rexie Catena, PA-C 11/02/23 1610    Zackowski, Scott, MD 11/02/23 1529

## 2023-11-02 NOTE — Consult Note (Signed)
 [facility] Psychiatric Consult Initial  Patient Name: .Aaron Rubio  MRN: [id number]  DOB: 12/31/1980  Consult Order details:  Orders (From admission, onward)     Start     Ordered   11/02/23 0746  CONSULT TO CALL ACT TEAM       Ordering Provider: Rexie Catena, PA-C  Provider:  (Not yet assigned)  Question:  Reason for Consult?  Answer:  SI   11/02/23 0746             Mode of Visit: In person    Psychiatry Consult Evaluation  Service Date: Nov 02, 2023 LOS:  LOS: 0 days  Chief Complaint "suicidal ideations"  Primary Psychiatric Diagnoses  Schizophrenia 2.   Homelessness 3.  Delusional  Assessment  Aaron Rubio is a 43 y.o. male admitted: Presented to the ED on 11/02/2023  6:09 AM for delusional disorder, paranoia. He carries the psychiatric diagnoses of schizophrenia and has a past medical history of chronic leg and back pain.   His current presentation of delusions, hallucinations, non compliant with medications is most consistent with decompensating schizophrenia. He meets criteria for inpatient psychiatric admission based on current symptoms.  Current outpatient psychotropic medications include Prozac , Atarax , Seroquel , Trazodone  and historically he has had a positive response to these medications. He was non compliant with medications prior to admission as evidenced by patient response. On initial examination, patient is delusional and responding to internal stimuli. Please see plan below for detailed recommendations.   Diagnoses:  Active Hospital problems: Principal Problem:   Chronic paranoid schizophrenia (HCC) Active Problems:   Psychosis (HCC)    Plan   ## Psychiatric Medication Recommendations:  Continue patient's home medication  ## Medical Decision Making Capacity: Not specifically addressed in this encounter  ## Further Work-up:  -- No further workup needed at this time EKG or UDS -- most recent EKG on 11/02/2023 had QtC of 442 --  Pertinent labwork reviewed earlier this admission includes: CBC, EKG, UDS   ## Disposition:-- We recommend inpatient psychiatric hospitalization. Patient is under voluntary admission status at this time; please IVC if attempts to leave hospital.  ## Behavioral / Environmental: -To minimize splitting of staff, assign one staff person to communicate all information from the team when feasible. or Utilize compassion and acknowledge the patient's experiences while setting clear and realistic expectations for care.    ## Safety and Observation Level:  - Based on my clinical evaluation, I estimate the patient to be at low risk of self harm in the current setting. - At this time, we recommend  routine. This decision is based on my review of the chart including patient's history and current presentation, interview of the patient, mental status examination, and consideration of suicide risk including evaluating suicidal ideation, plan, intent, suicidal or self-harm behaviors, risk factors, and protective factors. This judgment is based on our ability to directly address suicide risk, implement suicide prevention strategies, and develop a safety plan while the patient is in the clinical setting. Please contact our team if there is a concern that risk level has changed.  CSSR Risk Category:C-SSRS RISK CATEGORY: Low Risk  Suicide Risk Assessment: Patient has following modifiable risk factors for suicide: recklessness and medication noncompliance, which we are addressing by recommended inpatient psychiatric admission. Patient has following non-modifiable or demographic risk factors for suicide: male gender and psychiatric hospitalization Patient has the following protective factors against suicide: Supportive friends  Thank you for this consult request. Recommendations have been communicated to  the primary team.  We will recommend inpatient psychiatric admission and continue to follow patient at this time.    Chandra Come, PMHNP       History of Present Illness  Relevant Aspects of Hospital ED Course:  Admitted on 11/02/2023 for suicidal ideation and hallucinations.  Patient Report:  On evaluation Aaron Rubio presents with rambling speech, is very labile and appears to have some thought blocking.  Patient does not answer questions directly and was hyper focused security guard that was standing near this provider. Being aggressive and angry towards security guard and feeling paranoid. 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  Off-and-on patient appears to be responded to internal stimuli, as he begins talking to himself. Patient states that he has not been sleeping, eating and that he does not have any family.  During evaluation Aaron Rubio is sitting up on his hospital gurney, eating breakfast and appears to be in moderate distress, as he is agitated with the security guard.  He is alert, oriented x  to self and environment, excited, anxious. His mood is agitated with congruent affect.  He has delayed speech and appears to have thought blocking and is easily distracted. Patient appears to be responding to internal stimuli and is paranoid thinking the security guard is trying to take his food.    Patient recommended for inpatient treatment, for crisis management, safety and stabilization.   Psych ROS:  Depression: Denies  Anxiety:  Endorses Mania (lifetime and current): Denies Psychosis: (lifetime and current): Yes  Collateral information:  Contacted None: The patient he has no family or no one to contact  Review of Systems  Psychiatric/Behavioral:  Positive for hallucinations.      Psychiatric and Social History  Psychiatric History:  Information collected from chart review  Prev Dx/Sx: Schizophrenia Current Psych Provider: None Home Meds (current): See above Previous Med Trials:  Yes Therapy: None  Prior Psych Hospitalization: Yes Prior Self Harm: Unknown Prior Violence: Yes  Family Psych History: Unknown Family Hx suicide: Unknown  Social History:  Developmental Hx: Deferred Educational Hx: Unknown Occupational Hx: Unemployed Legal Hx: Unknown Living Situation: Homeless Spiritual Hx: Yes Access to weapons/lethal means: Denies  Substance History Alcohol: Unknown   Tobacco: Yes Illicit drugs: Yes Prescription drug abuse: Unknown Rehab hx: Unknown  Exam Findings  Physical Exam:  Vital Signs:  Temp:  [97.7 F (36.5 C)-97.8 F (36.6 C)] 97.7 F (36.5 C) (05/02 0630) Pulse Rate:  [74-94] 74 (05/02 0800) Resp:  [16-20] 20 (05/02 0800) BP: (126-174)/(94-122) 126/94 (05/02 0800) SpO2:  [99 %-100 %] 100 % (05/02 0800) Blood pressure (!) 126/94, pulse 74, temperature 97.7 F (36.5 C), temperature source Oral, resp. rate 20, SpO2 100%. There is no height or weight on file to calculate BMI.  Physical Exam Neurological:     Mental Status: He is alert.  Psychiatric:        Attention and Perception: He is inattentive.        Mood and Affect: Mood is anxious. Affect is angry.        Speech: Speech is rapid and pressured.        Behavior: Behavior is agitated.        Thought Content: Thought content is paranoid and delusional.        Cognition and Memory: Cognition is impaired.        Judgment: Judgment is impulsive.  Mental Status Exam: General Appearance: Bizarre  Orientation:  Other:  self and environment   Memory:  Immediate;   Poor Remote;   Poor  Concentration:  Concentration: Poor and Attention Span: Poor  Recall:  Poor  Attention  Poor  Eye Contact:  Fair  Speech:  Garbled and Pressured  Language:  Fair  Volume:  Normal  Mood: anxious  Affect:  Full Range  Thought Process:  Delusional   Thought Content:  Hallucinations: Auditory and Paranoid Ideation  Suicidal Thoughts:  No  Homicidal Thoughts:  No  Judgement:  Impaired   Insight:  Lacking  Psychomotor Activity:  Normal  Akathisia:  NA  Fund of Knowledge:  NA      Assets:  Presenter, broadcasting Social Support  Cognition:  Impaired,  Moderate  ADL's:  Impaired  AIMS (if indicated):        Other History   These have been pulled in through the EMR, reviewed, and updated if appropriate.  Family History:  The patient's family history is not on file.  Medical History: Past Medical History:  Diagnosis Date   Hypertension    Schizophrenia [facility])     Surgical History: No past surgical history on file.   Medications:   Current Facility-Administered Medications:    acetaminophen  (TYLENOL ) tablet 650 mg, 650 mg, Oral, Q4H PRN, Franaszek, Amanda, PA-C   [START ON 11/03/2023] FLUoxetine  (PROZAC ) capsule 10 mg, 10 mg, Oral, Daily, Franaszek, Amanda, PA-C   hydrOXYzine  (ATARAX ) tablet 25 mg, 25 mg, Oral, TID PRN, Franaszek, Amanda, PA-C   QUEtiapine  (SEROQUEL ) tablet 100 mg, 100 mg, Oral, QHS, [name], Joelle Flessner A, PMHNP   QUEtiapine  (SEROQUEL ) tablet 25 mg, 25 mg, Oral, NOW, [name], Litzy Dicker A, PMHNP   traZODone  (DESYREL ) tablet 50 mg, 50 mg, Oral, QHS PRN, Franaszek, Amanda, PA-C  Current Outpatient Medications:    cyclobenzaprine  (FLEXERIL ) 10 MG tablet, Take 0.5-1 tablets (5-10 mg total) by mouth at bedtime as needed for muscle spasms., Disp: 7 tablet, Rfl: 0   FLUoxetine  (PROZAC ) 10 MG capsule, Take 1 capsule (10 mg total) by mouth daily., Disp: 30 capsule, Rfl: 0   hydrOXYzine  (ATARAX ) 25 MG tablet, Take 1 tablet (25 mg total) by mouth 3 (three) times daily as needed for anxiety., Disp: 30 tablet, Rfl: 0   QUEtiapine  (SEROQUEL ) 200 MG tablet, Take 1 tablet (200 mg total) by mouth at bedtime., Disp: 30 tablet, Rfl: 0   QUEtiapine  (SEROQUEL ) 25 MG tablet, Take 1 tablet (25 mg total) by mouth 2 (two) times daily., Disp: 60 tablet, Rfl: 0   traZODone  (DESYREL ) 50 MG tablet, Take 1 tablet (50 mg total) by mouth at bedtime as needed  for sleep., Disp: 30 tablet, Rfl: 0  Allergies: No Known Allergies  Dannah Ryles [name], PMHNP

## 2023-11-02 NOTE — ED Notes (Signed)
 Pt changed into burgundy scrubs. Pt has 2 personal belongings bag. Bag LABELED and placed by 1-8 Nursing Desk. Security wanded pt.

## 2023-11-02 NOTE — ED Notes (Signed)
 Report given to [facility] [email or web address] 

## 2023-11-02 NOTE — ED Notes (Signed)
 Patient refused Seroquel  but did sign voluntary consent form

## 2023-11-02 NOTE — ED Notes (Signed)
 Patient in room talking with self, rambling, delusional thinking he is in [place].

## 2023-11-02 NOTE — ED Triage Notes (Signed)
 Pt arrives as a transfer from [facility]. Pt was sent here for further evaluation regarding his left lower back and HTN. Report he feels like he cant walk s/t pain. When asked how long pt sts "some time." Pt arrives ambulatory to room.  Pt was being evaluated at [facility] for suicidal ideation and non-cooperative behavior.

## 2023-11-02 NOTE — Discharge Instructions (Addendum)
 Please engage in light physical activity (like walking) to prevent your back pain from worsening and to prevent stiffness. Refrain from bedrest which can make your pain worse.   You may use up to 600mg  ibuprofen  every 6 hours as needed for pain.  Do not exceed 2.4g of ibuprofen  per day.  You were given your first dose here today.    Alternatively, you may take up to 1000mg  of tylenol  every 6 hours as needed for pain.  Do not take more then 4g per day.  You may use a heating back on your back to help with the pain.  You have been prescribed a muscle relaxer called Flexeril  (cyclobenzaprine ). You may take 0.5 - 1 tablet (5-10mg ) before bed as needed for muscle pain. This medication can be sedating. Do not drive or operate heavy machinery after taking this medicine. Do not drink alcohol or take other sedating medications when taking this medicine for safety reasons.  Keep this out of reach of small children.     Please contact your PCP if your back pain does not start to improve over the next 2 weeks as you may benefit from a PT referral from your PCP.  Your blood pressure was elevated here today at 174/98. Aim to get 30 minutes of exercise daily and limit intake of processed/fast foods. Please take and record your blood pressures at home. Take them at the same time every day. Follow up with your PCP within the next month to discuss if you may need treatment for your blood pressure.    Return to the ER if you have loss of bowel or bladder control, you develop fever or chills, you become very dizzy, you have shortness of breath, chest pain.

## 2023-11-02 NOTE — Progress Notes (Signed)
   11/02/23 0451  [facility] Triage Screening (Walk-ins at [facility] only)  How Did You Hear About Us ? Self  What Is the Reason for Your Visit/Call Today? Aaron Rubio is a 43 year old male who presents to [facility] with a history of paranoid schizophrenia and polysubstance abuse. Patient was discharged from [facility] on 4/30 per chart review. Per chart patient is homeless. He reports severe pain in his feet and back. He states his spine is hurting and it feels like he is dying. Patient has a elevated blood pressure. He is anxious, irritable and uncooperative. He reports passive SI but denies a plan or intent. He denies HI and AVH at this time, although he appears to have chronic auditory hallucinations.  How Long Has This Been Causing You Problems? 1 wk - 1 month  Have You Recently Had Any Thoughts About Hurting Yourself? Yes  How long ago did you have thoughts about hurting yourself? passive SI  Are You Planning to Commit Suicide/Harm Yourself At This time? No  Have you Recently Had Thoughts About Hurting Someone [name]? No  How long ago did you have thoughts of harming others? denies HI  Are You Planning To Harm Someone At This Time? No  Explanation: denies HI  Physical Abuse Denies  Verbal Abuse Denies  Sexual Abuse Denies  Exploitation of patient/patient's resources Denies  Self-Neglect Denies  Possible abuse reported to:  (n/a)  Are you currently experiencing any auditory, visual or other hallucinations? No  Please explain the hallucinations you are currently experiencing: denies but appears to have chronic auditory hallucinations  Have You Used Any Alcohol or Drugs in the Past 24 Hours? No  Do you have any current medical co-morbidities that require immediate attention? No  Clinician description of patient physical appearance/behavior: unkempt, irritable, anxious  What Do You Feel Would Help You the Most Today? Treatment for Depression or other mood problem  If access to [facility] was not available,  would you have sought care in the Emergency Department? Yes  Determination of Need Urgent (48 hours)  Options For Referral ED Visit  Determination of Need filed? Yes

## 2023-11-02 NOTE — ED Notes (Signed)
 Patient has been picked up by [employer], walked out with security.

## 2023-11-02 NOTE — Discharge Instructions (Addendum)

## 2023-11-02 NOTE — ED Notes (Signed)
Called [employer] for transport to [facility]

## 2023-11-02 NOTE — ED Notes (Signed)
 Patient refusing to now go to [facility] voluntarily after report called and transport called. Katheren Palomino notified, [facility] called, Safe transport cancelled.

## 2023-11-02 NOTE — ED Provider Notes (Signed)
 Behavioral Health Urgent Care Medical Screening Exam  Patient Name: Aaron Rubio MRN: [id number] Date of Evaluation: 11/02/23 Chief Complaint:  " I'm dying, the pain in my back is terrible". Diagnosis:  Final diagnoses:  Chronic back pain, unspecified back location, unspecified back pain laterality  Malingering  Homeless    History of Present illness: Aaron Rubio is a 43 y.o. male. With psychiatric history of schizophrenia, Psychosis and polysubstance abuse, who presents voluntarily as a walk in to [facility] with complaints of worsening back pain and feeling like he is dying.  Patient was seen face to face by this provider and chart reviewed.  On evaluation, patient is alert, oriented at baseline, and irritable. Speech is clear, coherent and logical. Pt appears casually dressed. Eye contact is bizarre. Mood is anxious, affect is congruent with mood. Thought process is goal directed and thought content is filled with ruminations about his back pain. Pt endorses passive SI,no plan, denies HI/AVH. There is no objective indication that the patient is responding to internal stimuli. No delusions elicited during this assessment.    Patient with complaints of back pain which is "killing me, I feel like I'm dying, I can't walk". Pt is loud, and evasive when asked follow up questions about his pain. He declines to discuss or elaborate.  He endorses passive SI, no plan due to pain. He declines to respond when asked about substance abuse.  He is ruminative about his back pain would not engage further with provider. Patient is homeless and was discharged from the [facility] 10/31/23. He is well known to the behavioral health service line with frequent ED/Urgent care visits. There is concern for malingering and secondary gain for shelter due to homelessness.  Patient informed he will be transferred to [facility] for medical attention.   Flowsheet Row Ad[facility] (Discharged) [facility] from 10/24/2023 in [facility] ED from 10/19/2023 in [facility]  C-SSRS RISK CATEGORY Low Risk No Risk High Risk       Psychiatric Specialty Exam  Presentation  General Appearance:Casual  Eye Contact:Other (comment) (stares)  Speech:Clear and Coherent  Speech Volume:Increased  Handedness:Right   Mood and Affect  Mood: Irritable  Affect: Congruent   Thought Process  Thought Processes: Goal Directed  Descriptions of Associations:Loose  Orientation:Full (Time, Place and Person)  Thought Content:Rumination  Diagnosis of Schizophrenia or Schizoaffective disorder in past: Yes  Duration of Psychotic Symptoms: Greater than six months  Hallucinations:None Denies.  Ideas of Reference:None  Suicidal Thoughts:Yes, Passive Without Plan  Homicidal Thoughts:No   Sensorium  Memory: Immediate Poor  Judgment: Poor  Insight: Poor   Executive Functions  Concentration: Fair  Attention Span: Fair  Recall: Fair  Fund of Knowledge: Fair  Language: Fair   Psychomotor Activity  Psychomotor Activity: Normal   Assets  Assets: Manufacturing systems engineer; Desire for Improvement   Sleep  Sleep: Fair  Number of hours:  8.75   Physical Exam: Physical Exam Constitutional:      Appearance: He is not toxic-appearing or diaphoretic.  HENT:     Nose: No congestion.  Cardiovascular:     Rate and Rhythm: Normal rate.  Pulmonary:     Effort: No respiratory distress.  Chest:     Chest wall: No tenderness.  Neurological:     Mental Status: He is alert. Mental status is at baseline.  Psychiatric:        Attention and Perception: Attention  and perception normal.        Mood and Affect: Mood is anxious. Affect is inappropriate.        Behavior: Behavior is hyperactive.        Thought Content: Thought content includes suicidal ideation.    Review of Systems   Constitutional:  Negative for chills, diaphoresis and fever.  HENT:  Negative for congestion.   Eyes:  Negative for discharge.  Respiratory:  Negative for cough, shortness of breath and wheezing.   Cardiovascular:  Negative for chest pain and palpitations.  Gastrointestinal:  Negative for diarrhea, nausea and vomiting.  Neurological:  Negative for dizziness, seizures and headaches.  Psychiatric/Behavioral:  The patient is nervous/anxious.    Blood pressure (!) 158/122, pulse 94, temperature 97.8 F (36.6 C), resp. rate 20, SpO2 100%. There is no height or weight on file to calculate BMI.  Musculoskeletal: Strength & Muscle Tone: within normal limits Gait & Station: normal Patient leans: N/A   [facility] MSE Discharge Disposition for Follow up and Recommendations: Based on my evaluation the patient appears to have an emergency medical condition for which I recommend the patient be transferred to the emergency department for further evaluation.   Recommend transfer to [facility] due to complaints of worsening back pain/sciatica. Recommend TTS consult if indicated, when medically stable .  Patient offered medication for elevated BP 158/122, he declined.  I spoke with Dr Maralee Senate at [facility] and the provider has agreed to accept the patient. EMTALA completed.   Sandralee Crow, NP 11/02/2023, 5:12 AM

## 2023-11-02 NOTE — ED Notes (Signed)
 Given patient [place] biscuit and orange juice

## 2023-11-02 NOTE — ED Notes (Signed)
 Patient made aware of needing urine sample

## 2023-11-03 ENCOUNTER — Inpatient Hospital Stay (HOSPITAL_COMMUNITY)
Admission: AD | Admit: 2023-11-03 | Discharge: 2023-11-11 | DRG: 885 | Disposition: A | Discharge status: Home / Self Care | Source: Intra-hospital | Attending: Psychiatry | Admitting: Psychiatry

## 2023-11-03 ENCOUNTER — Encounter (HOSPITAL_COMMUNITY): Payer: Self-pay | Admitting: Psychiatry

## 2023-11-03 ENCOUNTER — Other Ambulatory Visit: Payer: Self-pay

## 2023-11-03 DIAGNOSIS — F200 Paranoid schizophrenia: Principal | ICD-10-CM | POA: Diagnosis present

## 2023-11-03 DIAGNOSIS — F209 Schizophrenia, unspecified: Secondary | ICD-10-CM | POA: Diagnosis present

## 2023-11-03 DIAGNOSIS — Z608 Other problems related to social environment: Secondary | ICD-10-CM | POA: Diagnosis present

## 2023-11-03 DIAGNOSIS — F17210 Nicotine dependence, cigarettes, uncomplicated: Secondary | ICD-10-CM | POA: Diagnosis present

## 2023-11-03 DIAGNOSIS — Z560 Unemployment, unspecified: Secondary | ICD-10-CM

## 2023-11-03 DIAGNOSIS — Z5901 Sheltered homelessness: Secondary | ICD-10-CM | POA: Diagnosis not present

## 2023-11-03 DIAGNOSIS — I10 Essential (primary) hypertension: Secondary | ICD-10-CM | POA: Diagnosis present

## 2023-11-03 DIAGNOSIS — Z5971 Insufficient health insurance coverage: Secondary | ICD-10-CM | POA: Diagnosis not present

## 2023-11-03 DIAGNOSIS — Z91148 Patient's other noncompliance with medication regimen for other reason: Secondary | ICD-10-CM | POA: Diagnosis not present

## 2023-11-03 MED ORDER — OLANZAPINE 10 MG IM SOLR
5.0000 mg | Freq: Two times a day (BID) | INTRAMUSCULAR | Status: DC
Start: 2023-11-03 — End: 2023-11-04
  Filled 2023-11-03 (×5): qty 10

## 2023-11-03 MED ORDER — FLUOXETINE HCL 10 MG PO CAPS
10.0000 mg | ORAL_CAPSULE | Freq: Every day | ORAL | Status: DC
  Filled 2023-11-03 (×2): qty 1

## 2023-11-03 MED ORDER — LORAZEPAM 2 MG/ML IJ SOLN
2.0000 mg | Freq: Three times a day (TID) | INTRAMUSCULAR | Status: DC | PRN
  Filled 2023-11-03: qty 1

## 2023-11-03 MED ORDER — HALOPERIDOL 5 MG PO TABS
5.0000 mg | ORAL_TABLET | Freq: Two times a day (BID) | ORAL | Status: DC
  Filled 2023-11-03 (×6): qty 1

## 2023-11-03 MED ORDER — QUETIAPINE FUMARATE 100 MG PO TABS
100.0000 mg | ORAL_TABLET | Freq: Every day | ORAL | Status: DC
  Filled 2023-11-03: qty 1

## 2023-11-03 MED ORDER — METOPROLOL TARTRATE 25 MG PO TABS
25.0000 mg | ORAL_TABLET | Freq: Every day | ORAL | Status: DC
  Filled 2023-11-03 (×4): qty 1

## 2023-11-03 MED ORDER — LORAZEPAM 2 MG/ML IJ SOLN: 2.0000 mg | Freq: Three times a day (TID) | INTRAMUSCULAR | Status: DC | PRN

## 2023-11-03 MED ORDER — TRAZODONE HCL 50 MG PO TABS
50.0000 mg | ORAL_TABLET | Freq: Every evening | ORAL | Status: DC | PRN
  Administered 2023-11-04 – 2023-11-10 (×7): 50 mg via ORAL
  Filled 2023-11-03: qty 1
  Filled 2023-11-03: qty 7
  Filled 2023-11-03 (×6): qty 1
  Filled 2023-11-03: qty 7

## 2023-11-03 MED ORDER — CLONIDINE HCL 0.1 MG PO TABS
0.1000 mg | ORAL_TABLET | Freq: Four times a day (QID) | ORAL | Status: DC | PRN
  Administered 2023-11-06 – 2023-11-07 (×2): 0.1 mg via ORAL
  Filled 2023-11-03 (×2): qty 1

## 2023-11-03 MED ORDER — OLANZAPINE 5 MG PO TBDP
5.0000 mg | ORAL_TABLET | Freq: Two times a day (BID) | ORAL | Status: DC
  Administered 2023-11-03 – 2023-11-04 (×3): 5 mg via ORAL
  Filled 2023-11-03 (×7): qty 1

## 2023-11-03 MED ORDER — ALUM & MAG HYDROXIDE-SIMETH 200-200-20 MG/5ML PO SUSP: 30.0000 mL | ORAL | Status: DC | PRN

## 2023-11-03 MED ORDER — HALOPERIDOL 5 MG PO TABS
5.0000 mg | ORAL_TABLET | Freq: Three times a day (TID) | ORAL | Status: DC | PRN
  Filled 2023-11-03: qty 1

## 2023-11-03 MED ORDER — ACETAMINOPHEN 325 MG PO TABS
650.0000 mg | ORAL_TABLET | Freq: Four times a day (QID) | ORAL | Status: DC | PRN
  Administered 2023-11-03 – 2023-11-06 (×2): 650 mg via ORAL
  Filled 2023-11-03 (×2): qty 2

## 2023-11-03 MED ORDER — DIPHENHYDRAMINE HCL 50 MG/ML IJ SOLN
50.0000 mg | Freq: Three times a day (TID) | INTRAMUSCULAR | Status: DC | PRN
  Filled 2023-11-03: qty 1

## 2023-11-03 MED ORDER — MAGNESIUM HYDROXIDE 400 MG/5ML PO SUSP: 30.0000 mL | Freq: Every day | ORAL | Status: DC | PRN

## 2023-11-03 MED ORDER — DIPHENHYDRAMINE HCL 50 MG/ML IJ SOLN: 50.0000 mg | Freq: Three times a day (TID) | INTRAMUSCULAR | Status: DC | PRN

## 2023-11-03 MED ORDER — DIPHENHYDRAMINE HCL 25 MG PO CAPS
50.0000 mg | ORAL_CAPSULE | Freq: Three times a day (TID) | ORAL | Status: DC | PRN
  Filled 2023-11-03: qty 2

## 2023-11-03 MED ORDER — HYDROXYZINE HCL 25 MG PO TABS
25.0000 mg | ORAL_TABLET | Freq: Three times a day (TID) | ORAL | Status: DC | PRN
  Administered 2023-11-04 – 2023-11-10 (×5): 25 mg via ORAL
  Filled 2023-11-03 (×3): qty 1
  Filled 2023-11-03: qty 10
  Filled 2023-11-03: qty 1
  Filled 2023-11-03: qty 10
  Filled 2023-11-03 (×2): qty 1

## 2023-11-03 MED ORDER — CLONIDINE HCL 0.1 MG PO TABS
0.1000 mg | ORAL_TABLET | Freq: Once | ORAL | Status: DC
  Filled 2023-11-03: qty 1

## 2023-11-03 MED ORDER — BENZTROPINE MESYLATE 1 MG PO TABS
1.0000 mg | ORAL_TABLET | Freq: Two times a day (BID) | ORAL | Status: DC
  Administered 2023-11-03: 1 mg via ORAL
  Filled 2023-11-03 (×6): qty 1

## 2023-11-03 MED ORDER — HALOPERIDOL LACTATE 5 MG/ML IJ SOLN: 5.0000 mg | Freq: Three times a day (TID) | INTRAMUSCULAR | Status: DC | PRN

## 2023-11-03 MED ORDER — HALOPERIDOL LACTATE 5 MG/ML IJ SOLN
10.0000 mg | Freq: Three times a day (TID) | INTRAMUSCULAR | Status: DC | PRN
  Filled 2023-11-03: qty 2

## 2023-11-03 NOTE — Progress Notes (Signed)
   11/03/23 1500  Psych Admission Type (Psych Patients Only)  Admission Status Involuntary  Psychosocial Assessment  Patient Complaints Agitation  Eye Contact Intense  Facial Expression Animated  Affect Labile  Speech Tangential  Interaction Guarded  Motor Activity Fidgety  Appearance/Hygiene In scrubs  Behavior Characteristics Anxious;Restless  Mood Labile;Suspicious  Thought Process  Coherency Loose associations;Tangential;Disorganized  Content Preoccupation  Delusions Paranoid  Perception Hallucinations  Hallucination Auditory  Judgment Limited  Confusion Moderate  Danger to Self  Current suicidal ideation? Denies  Description of Suicide Plan none  Self-Injurious Behavior No self-injurious ideation or behavior indicators observed or expressed   Description of Agreement verbal  Danger to Others  Danger to Others None reported or observed

## 2023-11-03 NOTE — Progress Notes (Addendum)
 Upon evaluation this morning patient presents disorganized and nonlinear telling me he is back to the hospital "I had trouble with medications" he is unable to formulate any logical details regarding him coming back to the hospital then he goes on to tell me that they are trying to tell him that his blood pressure is high referring to the staff and adds "look at my vein if my blood pressure is high I will see blood in my veins you need to be able to see it to" he is refusing clonidine  as needed for elevated blood pressure this morning.  He is getting more irritated as I asked him more question in regard to cocaine use prior to admission.  He tells me "I was in prison I am not on metoprolol " when asked him that he was on metoprolol  in the past per chart review and if he has been taking it recently.  I had to terminate the interview as he was getting loud and agitated verbally, he also presents paranoid refusing to go to his room for interview and asking to be interviewed in the dayroom.  He was offered Haldol  and Cogentin  p.o. and he refused it noting to staff that it is coming from the government and he will not take it indicating ongoing paranoia and disorganized thought process.  Patient displays poor insight to severity of his mental illness and need for medication treatment, presenting psychotic and paranoid, please note patient did improve during his last stay when was treated with antipsychotics which unfortunately he is refusing at this time.  In my opinion patient meets criteria for forced medications, I asked for patient to be evaluated by Dr. Daneil Dunker for forced medication order second eval, will follow.

## 2023-11-03 NOTE — BHH Group Notes (Signed)
 Adult Psychoeducational Group Note  Date:  11/03/2023 Time:  1:14 PM  Group Topic/Focus:  Goals Group:   The focus of this group is to help patients establish daily goals to achieve during treatment and discuss how the patient can incorporate goal setting into their daily lives to aide in recovery.  Participation Level:  na  Participation Quality:  na  Affect:  na  Cognitive:  na  Insight: na  Engagement in Group:  na  Modes of Intervention:  na  Additional Comments:  na  Zettie Hillock 11/03/2023, 1:14 PM

## 2023-11-03 NOTE — Progress Notes (Addendum)
 Admission Note: Patient ID: Aaron Rubio, male   DOB: [birth year], 43 y.o.   MRN: [id number]  Patient is a 43 year old male admitted from [facility] to [facility] involuntarily, with hx of schizophrenia, polysubstance, SI and HTN. Patient discharged from [facility] on 10/31/23. Patient presents labile, disorganized and tangential on assessment. Patient is preoccupied, responding to internal stimuli throughout interaction with staff. Patient screened for SI/HI, patient states, "no, it's not there". Patient is unable to participate in admission assessment questions and requires frequent redirection. Skin assessment completed with Larinda Plover, MHT present, patient found to have tattoos across body, scar to L knee and callused sole of L foot. Patient was escorted safely to the unit and provided a meal/fluids. Q15 minute safety checks implemented.

## 2023-11-03 NOTE — Plan of Care (Signed)
  Problem: Education: Goal: Knowledge of [facility] Education information/materials will improve Outcome: Progressing Goal: Emotional status will improve Outcome: Progressing Goal: Mental status will improve Outcome: Progressing   Problem: Education: Goal: Verbalization of understanding the information provided will improve Outcome: Not Progressing; pt irritable and minimal with communication

## 2023-11-03 NOTE — Progress Notes (Signed)
 [facility] Second Physician Opinion Progress Note for Medication Administration to Non-consenting Patients (For Involuntarily Committed Patients)  Patient: Aaron Rubio  Date of Birth: [birth year]  MRN: [id number]   Reason for the Medication: The patient, without the benefit of the specific treatment measure, is incapable of participating in any available treatment plan that will give the patient a realistic opportunity of improving the patient's condition.   Consideration of Side Effects: Consideration of the side effects related to the medication plan has been given.  Rationale for Medication Administration: Consultation for forced medication protocol was requested by Dr. Linnie Riches because the patient is not taking his medication which has resulted in acute psychiatric decompensation.  Patient is not engaging in his treatment plans.  He has been irritable and paranoid.  The patient currently is too symptomatic to engage in meaningful conversations regarding the treatment of her psychiatric illness and is unlikely to improve without administration of forced medications. He is not able to participate in a meaningful way in decisions regarding medications or treatment. At this time I am in agreement that the patient needs to continue an antipsychotic in order to have a realistic expectation for improving his paranoia and managing his active psychosis. It is my opinion that should the patient refuse medications that this would lead to more risk of potential self-harm than if he was on a protocol for medications against objection.  I agree with Dr. Linnie Riches in this assessment.  This documentation is good for (7) seven days from the date of the MD signature. New documentation must be completed every seven (7) days with detailed justification in the medical record if the patient requires continued non-emergent administration of psychotropic medications.

## 2023-11-03 NOTE — BHH Suicide Risk Assessment (Signed)
 [facility] Admission Suicide Risk Assessment   Nursing information obtained from:    Demographic factors:    Current Mental Status:    Loss Factors:    Historical Factors:    Risk Reduction Factors:     Total Time spent with patient: 1 hour Principal Problem: Chronic paranoid schizophrenia (HCC) Diagnosis:  Principal Problem:   Chronic paranoid schizophrenia (HCC)  Subjective Data: See H&P  Continued Clinical Symptoms:    The "Alcohol Use Disorders Identification Test", Guidelines for Use in Primary Care, Second Edition.  World Science writer [facility]). Score between 0-7:  no or low risk or alcohol related problems. Score between 8-15:  moderate risk of alcohol related problems. Score between 16-19:  high risk of alcohol related problems. Score 20 or above:  warrants further diagnostic evaluation for alcohol dependence and treatment.   CLINICAL FACTORS:   Alcohol/Substance Abuse/Dependencies Schizophrenia:   Paranoid or undifferentiated type   Musculoskeletal: Strength & Muscle Tone: within normal limits Gait & Station: normal Patient leans: N/A  Psychiatric Specialty Exam:  Presentation  General Appearance:  Casual  Eye Contact: Other (comment) (stares)  Speech: Clear and Coherent  Speech Volume: Increased  Handedness: Right   Mood and Affect  Mood: Irritable  Affect: Congruent   Thought Process  Thought Processes: Goal Directed  Descriptions of Associations:Loose  Orientation:Full (Time, Place and Person)  Thought Content:Rumination  History of Schizophrenia/Schizoaffective disorder:Yes  Duration of Psychotic Symptoms:Greater than six months  Hallucinations:Hallucinations: None  Ideas of Reference:None  Suicidal Thoughts:Suicidal Thoughts: Yes, Passive SI Passive Intent and/or Plan: Without Plan  Homicidal Thoughts:Homicidal Thoughts: No   Sensorium  Memory: Immediate Poor  Judgment: Poor  Insight: Poor   Executive  Functions  Concentration: Fair  Attention Span: Fair  Recall: Fair  Fund of Knowledge: Fair  Language: Fair   Psychomotor Activity  Psychomotor Activity: Psychomotor Activity: Normal   Assets  Assets: Manufacturing systems engineer; Desire for Improvement   Sleep  Sleep: Sleep: Fair    Physical Exam: Physical Exam ROS Blood pressure (!) 133/102, pulse 73, temperature 98 F (36.7 C), temperature source Oral, resp. rate 18, height 6\' 2"  (1.88 m), weight 98.4 kg, SpO2 100%. Body mass index is 27.86 kg/m.   COGNITIVE FEATURES THAT CONTRIBUTE TO RISK:  Loss of executive function    SUICIDE RISK:   Severe:  Frequent, intense, and enduring suicidal ideation, specific plan, no subjective intent, but some objective markers of intent (i.e., choice of lethal method), the method is accessible, some limited preparatory behavior, evidence of impaired self-control, severe dysphoria/symptomatology, multiple risk factors present, and few if any protective factors, particularly a lack of social support.  PLAN OF CARE: See H&P  I certify that inpatient services furnished can reasonably be expected to improve the patient's condition.   Yoanna Jurczyk Linnie Riches, MD 11/03/2023, 11:20 AM

## 2023-11-03 NOTE — BHH Counselor (Signed)
 Adult Comprehensive Assessment  Patient ID: Aaron Rubio, male   DOB: 1981/06/28, 43 y.Rubio.   MRN: [id number]  Information Source: Information source: Patient  Current Stressors:  Patient states their primary concerns and needs for treatment are:: Depression and suicide thoughta Patient states their goals for this hospitilization and ongoing recovery are:: I want to work on anger and letting things go that I need to, finding stability Educational / Learning stressors: none reported Employment / Job issues: none reported Family Relationships: None reported Surveyor, quantity / Lack of resources (include bankruptcy): The patient stated that yes Housing / Lack of housing: The patient stated that yes Physical health (include injuries & life threatening diseases): The patient stated that he still stated that he still have back pain Social relationships: none reported Substance abuse: none reported Bereavement / Loss: None reported  Living/Environment/Situation:  Living Arrangements: Alone, Other (Comment) Living conditions (as described by patient or guardian): homeless Who else lives in the home?: NA How long has patient lived in current situation?: 6 years What is atmosphere in current home: Chaotic, Dangerous, Temporary  Family History:  Marital status: Single Are you sexually active?: No What is your sexual orientation?: he/him Has your sexual activity been affected by drugs, alcohol, medication, or emotional stress?: No Does patient have children?: No  Childhood History:  By whom was/is the patient raised?: Other (Comment) Additional childhood history information: Raised by great grandparents Description of patient's relationship with caregiver when they were a child: It was good Patient's description of current relationship with people who raised him/her: It still good How were you disciplined when you got in trouble as a child/adolescent?: getting grounded Does patient have  siblings?: No Did patient suffer any verbal/emotional/physical/sexual abuse as a child?: No Did patient suffer from severe childhood neglect?: No Has patient ever been sexually abused/assaulted/raped as an adolescent or adult?: No Was the patient ever a victim of a crime or a disaster?: No Witnessed domestic violence?: No Has patient been affected by domestic violence as an adult?: No  Education:  Highest grade of school patient has completed: 12 Currently a Consulting civil engineer?: No Learning disability?: No  Employment/Work Situation:   Employment Situation: Unemployed Patient's Job has Been Impacted by Current Illness: No What is the Longest Time Patient has Held a Job?: 1.5 years 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?: No  Financial Resources:   Surveyor, quantity resources: No income, Medicaid, Food stamps Does patient have a Lawyer or guardian?: No  Alcohol/Substance Abuse:   What has been your use of drugs/alcohol within the last 12 months?: none reported If attempted suicide, did drugs/alcohol play a role in this?: No Alcohol/Substance Abuse Treatment Hx: Denies past history Has alcohol/substance abuse ever caused legal problems?: No  Social Support System:   Conservation officer, nature Support System: Poor Describe Community Support System: "I don't know of any" Type of faith/religion: God How does patient's faith help to cope with current illness?: The patient stated that he pray  Leisure/Recreation:   Do You Have Hobbies?: Yes Leisure and Hobbies: The patient stated that he like walk and sit in the rain  Strengths/Needs:   What is the patient's perception of their strengths?: The patient stated that he does not know Patient states they can use these personal strengths during their treatment to contribute to their recovery: none reported Patient states these barriers may affect/interfere with their  treatment: none reported Patient states these barriers may  affect their return to the community: The patient does not know Other important information patient would like considered in planning for their treatment: none reported  Discharge Plan:   Currently receiving community mental health services: No Patient states concerns and preferences for aftercare planning are: none reported Patient states they will know when they are safe and ready for discharge when: The patient state that he does not know Does patient have access to transportation?: No Does patient have financial barriers related to discharge medications?: No Patient description of barriers related to discharge medications: The patient has insurance Plan for no access to transportation at discharge: The patient stated that he needs a ride Will patient be returning to same living situation after discharge?: Yes  Summary/Recommendations:   Summary and Recommendations (to be completed by the evaluator): Aaron Rubio is 43 years old [place] American male was alert and oriented X4. The patient presented himself at [facility] and was transfer to [facility] for suicide ideation. The patient appears to be disorganized while answering assessment question. The patient's answers to the assessment questions are not attune to the patient's situations. While doing assessment the patient was calm and responding to the questions with answers "in heaven" when asked "do you have a home and where do you live? "by CSW. CSW will review client chart by provider. Per chart form provider, the client present disorganized and psychotic unable to participate in history, nonsensical thought process and speech noted, presenting paranoid refusing to be on any medication doe pressure or for psychosis even when encouraged, laughing inappropriately during evaluation. The patient appears to be delusional and unable to answer questions with intent of recovery.  Aaron Rubio  Aaron Rubio. 11/03/2023

## 2023-11-03 NOTE — Progress Notes (Signed)
 Patient refused once order for clonidine , stating, "I didn't come here for that, I need naproxen  and gabapentin". This RN explained to patient that there are not current orders for those medications at this time, but that the patient will be evaluated by the provider and can speak to them about orders at that time. Patient states, "It's not my blood pressure that is high, its the machine that keeps going and going." Patient began responding to internal stimuli and got agitated at staff on unit.    11/03/23 0626  Vitals  Temp 98 F (36.7 C)  Temp Source Oral  BP (!) 133/102  MAP (mmHg) 113  BP Location Right Arm  BP Method Automatic  Patient Position (if appropriate) Sitting  Pulse Rate 73  Pulse Rate Source Monitor  Oxygen Therapy  SpO2 100 %

## 2023-11-03 NOTE — Plan of Care (Signed)
  Problem: Safety: Goal: Periods of time without injury will increase Outcome: Progressing   Problem: Education: Goal: Knowledge of [facility] Education information/materials will improve Outcome: Progressing   

## 2023-11-03 NOTE — Progress Notes (Signed)
 Report received from [facility] at [facility] ED.

## 2023-11-03 NOTE — H&P (Signed)
 Psychiatric Admission Assessment Adult  Patient Identification: Aaron Rubio MRN:  [id number] Date of Evaluation:  11/03/2023  Chief Complaint:  Schizophrenia (HCC) [F20.9]  History of Present Illness:  Aaron Rubio is a 43 y.o., male with a past psychiatric history significant for paranoid schizophrenia who presents to the [facility] from [facility] emergency department for evaluation and management of worsening psychosis.  According to outside records, the patient presented to emergency room paranoid and responding to stimuli  Initial assessment on 11/03/2023, patient was evaluated on the inpatient unit, the patient presents disorganized and psychotic unable to participate in history taking, nonsensical thought process and speech noted, presenting paranoid refusing to be on any medications for blood pressure or for psychosis even when encouraged, laughing inappropriately during evaluation, loud and agitated verbally, delusional, unable to answer questions in linear informative manner.  Patient was evaluated for forced medication second eval.    Chart review: Notes from [facility] emergency department were reviewed  Psych meds prior to admission:  Was discharged from this facility on 4/28 on Seroquel  25 mg twice daily, Seroquel  100 mg at bedtime and Prozac  10 mg daily.  Per patient's report has been noncompliant with medications since discharge  Sleep Sleep:Sleep: Fair   Collateral information: None at this time  Past Psychiatric History:  Prior Psychiatric diagnoses: Schizophrenia Past Psychiatric Hospitalizations:  Multiple emergency room presentations for SI in the past, this is his second hospitalization, last hospitalization was in April for few days. History of self mutilation: None noted Past suicide attempts: None noted Past history of HI, violent or aggressive behavior: Unknown  Past Psychiatric medications trials: Responded to Seroquel  during  previous hospitalization, was discharged on Seroquel  25 mg twice daily and 100 mg at bedtime History of ECT/TMS: None noted  Outpatient psychiatric Follow up: Noncompliant Prior Outpatient Therapy: Noncompliant    Is the patient at risk to self? Yes.    Has the patient been a risk to self in the past 6 months? No.  Has the patient been a risk to self within the distant past? No.  Is the patient a risk to others? Yes.    Has the patient been a risk to others in the past 6 months? No.  Has the patient been a risk to others within the distant past? No.    Substance Use History: Unable to assess substance use, UDS positive for cocaine and marijuana prior to this admission.  Prior to last admission about a week ago patient was positive to cocaine only, during previous presentations to emergency room UDS were negative.  Alcohol Screening:    Substance Abuse History in the last 12 months:  Yes.     Tobacco Screening: Per chart review patient is a smoker, unable to assess details at this time given ongoing psychosis, patient refusing NicoDerm patch.    Past Medical/Surgical History:  Past Medical History:  Diagnosis Date   Hypertension    Schizophrenia (HCC)    History reviewed. No pertinent surgical history.  Family History: History reviewed. No pertinent family history.  Family Psychiatric History:  Psychiatric illness: Per chart review patient reported previously that he has no family history of mental illness or suicide.   Social History:  Social History   Substance and Sexual Activity  Alcohol Use Not Currently     Social History   Substance and Sexual Activity  Drug Use Not Currently    Living situation: Homeless, was recently discharged from the hospital to homeless shelter  Social support: Poor Marital Status: Unknown Children: Unknown Education: Unable to assess Employment: Camera operator service: Unable to assess Legal history: Patient reports was not  present before but unable to assess for any further details Trauma: Unable to assess Access to guns: Unable to assess   Allergies:  No Known Allergies  Lab Results:  Results for orders placed or performed during the hospital encounter of 11/02/23 (from the past 48 hours)  Comprehensive metabolic panel     Status: Abnormal   Collection Time: 11/02/23  8:30 AM  Result Value Ref Range   Sodium 137 135 - 145 mmol/L   Potassium 3.6 3.5 - 5.1 mmol/L   Chloride 101 98 - 111 mmol/L   CO2 25 22 - 32 mmol/L   Glucose, Bld 75 70 - 99 mg/dL    Comment: Glucose reference range applies only to samples taken after fasting for at least 8 hours.   BUN 28 (H) 6 - 20 mg/dL   Creatinine, Ser 1.61 0.61 - 1.24 mg/dL   Calcium 9.4 8.9 - 09.6 mg/dL   Total Protein 7.3 6.5 - 8.1 g/dL   Albumin 4.2 3.5 - 5.0 g/dL   AST 51 (H) 15 - 41 U/L   ALT 31 0 - 44 U/L   Alkaline Phosphatase 52 38 - 126 U/L   Total Bilirubin 1.2 0.0 - 1.2 mg/dL   GFR, Estimated >04 >54 mL/min    Comment: (NOTE) Calculated using the CKD-EPI Creatinine Equation (2021)    Anion gap 11 5 - 15    Comment: Performed at [facility], [address], [place], [place] [address]  Ethanol     Status: None   Collection Time: 11/02/23  8:30 AM  Result Value Ref Range   Alcohol, Ethyl (B) <15 <15 mg/dL    Comment: Please note change in reference range. (NOTE) For medical purposes only. Performed at [facility], [address] [place], [place] [address]   CBC with Diff     Status: Abnormal   Collection Time: 11/02/23  8:30 AM  Result Value Ref Range   WBC 10.6 (H) 4.0 - 10.5 K/uL   RBC 5.08 4.22 - 5.81 MIL/uL   Hemoglobin 13.5 13.0 - 17.0 g/dL   HCT 29.5 62.1 - 30.8 %   MCV 83.9 80.0 - 100.0 fL   MCH 26.6 26.0 - 34.0 pg   MCHC 31.7 30.0 - 36.0 g/dL   RDW 65.7 84.6 - 96.2 %   Platelets 203 150 - 400 K/uL   nRBC 0.0 0.0 - 0.2 %   Neutrophils Relative % 59 %   Neutro Abs 6.3 1.7 - 7.7 K/uL    Lymphocytes Relative 20 %   Lymphs Abs 2.1 0.7 - 4.0 K/uL   Monocytes Relative 13 %   Monocytes Absolute 1.4 (H) 0.1 - 1.0 K/uL   Eosinophils Relative 6 %   Eosinophils Absolute 0.7 (H) 0.0 - 0.5 K/uL   Basophils Relative 1 %   Basophils Absolute 0.1 0.0 - 0.1 K/uL   Immature Granulocytes 1 %   Abs Immature Granulocytes 0.07 0.00 - 0.07 K/uL    Comment: Performed at [facility], [address] [place], [place] [address]  Urine rapid drug screen (hosp performed)     Status: Abnormal   Collection Time: 11/02/23  9:40 AM  Result Value Ref Range   Opiates NONE DETECTED NONE DETECTED   Cocaine POSITIVE (A) NONE DETECTED   Benzodiazepines NONE DETECTED NONE  DETECTED   Amphetamines NONE DETECTED NONE DETECTED   Tetrahydrocannabinol POSITIVE (A) NONE DETECTED   Barbiturates NONE DETECTED NONE DETECTED    Comment: (NOTE) DRUG SCREEN FOR MEDICAL PURPOSES ONLY.  IF CONFIRMATION IS NEEDED FOR ANY PURPOSE, NOTIFY LAB WITHIN 5 DAYS.  LOWEST DETECTABLE LIMITS FOR URINE DRUG SCREEN Drug Class                     Cutoff (ng/mL) Amphetamine and metabolites    1000 Barbiturate and metabolites    200 Benzodiazepine                 200 Opiates and metabolites        300 Cocaine and metabolites        300 THC                            50 Performed at [facility], [address], [place], [place] [address]     Blood Alcohol level:  Lab Results  Component Value Date   [facility] <15 11/02/2023   ETH <10 05/17/2023    Metabolic Disorder Labs:  Lab Results  Component Value Date   HGBA1C 5.0 10/27/2023   MPG 96.8 10/27/2023   No results found for: "PROLACTIN" Lab Results  Component Value Date   CHOL 152 10/19/2023   TRIG 144 10/19/2023   HDL 57 10/19/2023   CHOLHDL 2.7 10/19/2023   VLDL 29 10/19/2023   LDLCALC 66 10/19/2023    Current Medications: Current Facility-Administered Medications  Medication Dose Route Frequency Provider Last Rate  Last Admin   acetaminophen  (TYLENOL ) tablet 650 mg  650 mg Oral Q6H PRN [name], [name], PMHNP       alum & mag hydroxide-simeth (MAALOX/MYLANTA) 200-200-20 MG/5ML suspension 30 mL  30 mL Oral Q4H PRN [name], [name], PMHNP       benztropine  (COGENTIN ) tablet 1 mg  1 mg Oral BID Cindra Austad, MD       cloNIDine  (CATAPRES ) tablet 0.1 mg  0.1 mg Oral Q6H PRN Linnie Riches, Aidenjames Heckmann, MD       haloperidol  (HALDOL ) tablet 5 mg  5 mg Oral TID PRN [name], [name], PMHNP       And   diphenhydrAMINE  (BENADRYL ) capsule 50 mg  50 mg Oral TID PRN [name], [name], PMHNP       haloperidol  lactate (HALDOL ) injection 5 mg  5 mg Intramuscular TID PRN [name], [name], PMHNP       And   diphenhydrAMINE  (BENADRYL ) injection 50 mg  50 mg Intramuscular TID PRN [name], [name], PMHNP       And   LORazepam  (ATIVAN ) injection 2 mg  2 mg Intramuscular TID PRN [name], [name], PMHNP       haloperidol  lactate (HALDOL ) injection 10 mg  10 mg Intramuscular TID PRN [name], [name], PMHNP       And   diphenhydrAMINE  (BENADRYL ) injection 50 mg  50 mg Intramuscular TID PRN [name], [name], PMHNP       And   LORazepam  (ATIVAN ) injection 2 mg  2 mg Intramuscular TID PRN [name], [name], PMHNP       haloperidol  (HALDOL ) tablet 5 mg  5 mg Oral BID Fernandez Kenley, MD       hydrOXYzine  (ATARAX ) tablet 25 mg  25 mg Oral TID PRN [name], [name], PMHNP       magnesium  hydroxide (MILK OF  MAGNESIA) suspension 30 mL  30 mL Oral Daily PRN [name], [name], PMHNP       metoprolol  tartrate (LOPRESSOR ) tablet 25 mg  25 mg Oral Daily Tanetta Fuhriman, MD       OLANZapine  zydis (ZYPREXA ) disintegrating tablet 5 mg  5 mg Oral BID Linnie Riches, Sherrel Ploch, MD       Or   OLANZapine  (ZYPREXA ) injection 5 mg  5 mg Intramuscular BID Radwan Cowley, MD       traZODone  (DESYREL ) tablet 50 mg  50 mg Oral QHS PRN [name], [name], PMHNP        PTA  Medications: Medications Prior to Admission  Medication Sig Dispense Refill Last Dose/Taking   mirtazapine  (REMERON ) 15 MG tablet Take 15 mg by mouth at bedtime.   Taking   QUEtiapine  (SEROQUEL ) 25 MG tablet Take 25 mg by mouth 2 (two) times daily.   Taking   cyclobenzaprine  (FLEXERIL ) 10 MG tablet Take 0.5-1 tablets (5-10 mg total) by mouth at bedtime as needed for muscle spasms. 7 tablet 0     Musculoskeletal: Strength & Muscle Tone: within normal limits Gait & Station: normal Patient leans: N/A   Physical Findings: AIMS: Facial and Oral Movements Muscles of Facial Expression: None Lips and Perioral Area: None Jaw: None Tongue: None,Extremity Movements Upper (arms, wrists, hands, fingers): None Lower (legs, knees, ankles, toes): None, Trunk Movements Neck, shoulders, hips: None, Global Judgements Severity of abnormal movements overall : None Incapacitation due to abnormal movements: None Patient's awareness of abnormal movements: No Awareness, Dental Status Current problems with teeth and/or dentures?: No Does patient usually wear dentures?: No Edentia?: No  CIWA:    COWS:     Psychiatric Specialty Exam:  General Appearance: Appears at stated age, unkempt and disheveled  Behavior: Irritable and agitated  Psychomotor Activity: Moderate psychomotor agitation noted  Eye Contact: Poor Speech: Increased tone and volume, increased amount at times   Mood: Irritable and agitated Affect: Congruent  Thought Process: Disorganized, nonsensical Descriptions of Associations: Disorganized Thought Content: Hallucinations: Responded to stimuli laughing inappropriately, probably hallucinating Delusions: Appears paranoid and delusional  Suicidal Thoughts: Denies passive or active SI, intention, plan  Homicidal Thoughts: Agitated, denies HI, intention, plan   Alertness/Orientation: Alert and partially oriented  Insight: poor Judgment: poor  Memory: Poor  Executive  Functions  Concentration: Poor Attention Span: Easily distractible Recall: Poor Fund of Knowledge: Limited   Physical Exam:  Physical Exam Vitals and nursing note reviewed.  Constitutional:      Appearance: Normal appearance.  HENT:     Head: Normocephalic and atraumatic.     Nose: Nose normal.  Eyes:     Extraocular Movements: Extraocular movements intact.  Pulmonary:     Effort: Pulmonary effort is normal.  Musculoskeletal:        General: Normal range of motion.     Cervical back: Normal range of motion.  Neurological:     General: No focal deficit present.     Mental Status: He is alert. Mental status is at baseline.    Review of Systems  All other systems reviewed and are negative.  Blood pressure (!) 127/94, pulse 95, temperature 98.2 F (36.8 C), temperature source Oral, resp. rate 18, height 6\' 2"  (1.88 m), weight 98.4 kg, SpO2 100%. Body mass index is 27.86 kg/m.   Assets  Assets:Communication Skills; Desire for Improvement    Treatment Plan Summary: Daily contact with patient to assess and evaluate symptoms and progress in treatment and Medication management  ASSESSMENT:  Principal Diagnosis: Chronic paranoid schizophrenia (HCC) Diagnosis:  Principal Problem:   Chronic paranoid schizophrenia (HCC)   PLAN: Safety and Monitoring:  -- Involuntary admission to inpatient psychiatric unit for safety, stabilization and treatment  -- Daily contact with patient to assess and evaluate symptoms and progress in treatment  -- Patient's case to be discussed in multi-disciplinary team meeting  -- Observation Level : q15 minute checks  -- Vital signs:  q12 hours  -- Precautions: suicide, elopement, and assault  2. Medications:   Given overt psychosis, paranoia and disorganized thought process with patient refusing p.o. medications:  Start forced meds orders as follows , start Zyprexa  Zydis 5 mg p.o. twice daily or Zyprexa  5 mg IM twice daily if refusing  p.o.  Will follow efficacy and safety and adjust dosing accordingly  Patient had history of good response to Seroquel  in the past, unfortunately decompensation was resulted from lack of compliance as well as using cocaine, will follow and monitor may benefit from switching to Haldol  and LAI to promote compliance and long-term, patient reports Haldol  gave him hives in the past I am unsure how reliable this information is, will follow.  Continue agitation protocol and monitor --  The risks/benefits/side-effects/alternatives to this medication were discussed in detail with the patient and time was given for questions. The patient consents to medication trial.    -- Metabolic profile and EKG monitoring obtained while on an atypical antipsychotic (BMI: Lipid Panel: HbgA1c: QTc:)     Start clonidine  as needed for elevated blood pressure and encourage compliance  Chart review indicates history of treatment with metoprolol  25 mg daily in the past, will restart at this time and monitor and encourage compliance     3. Labs Reviewed: CMP no significant abnormalities noted except for elevated BUN 28, CBC within normal level except for slightly elevated white blood cell count 10.6, UDS positive for cocaine and THC on 5/2, UDS positive for cocaine on 4/23, UDS completely negative on 4/19.  TSH within normal level 4/26, hemoglobin A1c 5.0 on 4/26, lipid panel within normal level on 4/18 EKG 5/2 QTc 442     Lab ordered: None at this time   4. Tobacco Use Disorder  -- Patient refused starting NicoDerm patch, will follow  -- Smoking cessation encouraged  5. Group and Therapy: -- Encouraged patient to participate in unit milieu and in scheduled group therapies   --Substance Use counseling: As patient appears cognitively will counsel regarding need to abstain completely from cocaine after discharge.  6. Discharge Planning:   -- Social work and case management to assist with discharge planning and  identification of hospital follow-up needs prior to discharge  -- Estimated LOS: 5-7 days  -- Discharge Concerns: Need to establish a safety plan; Medication compliance and effectiveness  -- Discharge Goals: Return home with outpatient referrals for mental health follow-up including medication management/psychotherapy   The patient is agreeable with the medication plan, as above. We will monitor the patient's response to pharmacologic treatment, and adjust medications as necessary. Patient is encouraged to participate in group therapy while admitted to the psychiatric unit. We will address other chronic and acute stressors, which contributed to the patient's worsening paranoia and psychosis, in order to reduce the risk of self-harm at discharge.   Physician Treatment Plan for Primary Diagnosis: Chronic paranoid schizophrenia (HCC) Long Term Goal(s): Improvement in symptoms so as ready for discharge  Short Term Goals: Ability to identify changes in lifestyle to reduce recurrence of condition  will improve, Ability to verbalize feelings will improve, Ability to disclose and discuss suicidal ideas, Ability to demonstrate self-control will improve, and Ability to identify and develop effective coping behaviors will improve   I certify that inpatient services furnished can reasonably be expected to improve the patient's condition.    Total Time Spent in Direct Patient Care:  I personally spent 55 minutes on the unit in direct patient care. The direct patient care time included face-to-face time with the patient, reviewing the patient's chart, communicating with other professionals, and coordinating care. Greater than 50% of this time was spent in counseling or coordinating care with the patient regarding goals of hospitalization, psycho-education, and discharge planning needs.    Reza Crymes Linnie Riches, MD 5/3/202511:38 AM

## 2023-11-03 NOTE — Group Note (Signed)
 Date:  11/03/2023 Time:  8:33 PM  Group Topic/Focus:  Wrap-Up Group:   The focus of this group is to help patients review their daily goal of treatment and discuss progress on daily workbooks.    Participation Level:  Did Not Attend   Aaron Rubio 11/03/2023, 8:33 PM

## 2023-11-03 NOTE — Plan of Care (Signed)

## 2023-11-04 DIAGNOSIS — F200 Paranoid schizophrenia: Secondary | ICD-10-CM | POA: Diagnosis not present

## 2023-11-04 MED ORDER — OLANZAPINE 10 MG IM SOLR
5.0000 mg | Freq: Two times a day (BID) | INTRAMUSCULAR | Status: DC
  Filled 2023-11-04 (×8): qty 10

## 2023-11-04 MED ORDER — OLANZAPINE 10 MG PO TBDP
10.0000 mg | ORAL_TABLET | Freq: Two times a day (BID) | ORAL | Status: DC
  Administered 2023-11-04 – 2023-11-06 (×4): 10 mg via ORAL
  Filled 2023-11-04 (×10): qty 1

## 2023-11-04 MED ORDER — OLANZAPINE 10 MG PO TBDP
10.0000 mg | ORAL_TABLET | ORAL | Status: AC
  Administered 2023-11-04: 10 mg via ORAL
  Filled 2023-11-04: qty 1

## 2023-11-04 MED ORDER — AMLODIPINE BESYLATE 5 MG PO TABS
5.0000 mg | ORAL_TABLET | Freq: Every day | ORAL | Status: DC
  Administered 2023-11-05 – 2023-11-07 (×3): 5 mg via ORAL
  Filled 2023-11-04 (×8): qty 1
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
## 2023-11-04 NOTE — Plan of Care (Signed)
  Problem: Education: Goal: Knowledge of [employer] [facility] Education information/materials will improve Outcome: Progressing Goal: Emotional status will improve Outcome: Progressing Goal: Mental status will improve Outcome: Progressing Goal: Verbalization of understanding the information provided will improve Outcome: Progressing   Problem: Coping: Goal: Ability to demonstrate self-control will improve Outcome: Progressing   Problem: Physical Regulation: Goal: Ability to maintain clinical measurements within normal limits will improve Outcome: Progressing

## 2023-11-04 NOTE — Progress Notes (Signed)
 [facility] MD Progress Note  11/04/2023 10:09 AM Aaron Rubio  MRN:  [id number]   Reason for Admission:  Aaron Rubio is a 43 y.o., male with a past psychiatric history significant for paranoid schizophrenia who presents to the [facility] from [facility] emergency department for evaluation and management of worsening psychosis.  According to outside records, the patient presented to emergency room paranoid and responding to stimuli   Initial assessment on 11/03/2023, patient was evaluated on the inpatient unit, the patient presents disorganized and psychotic unable to participate in history taking, nonsensical thought process and speech noted, presenting paranoid refusing to be on any medications for blood pressure or for psychosis even when encouraged, laughing inappropriately during evaluation, loud and agitated verbally, delusional, unable to answer questions in linear informative manner.  Patient was evaluated for forced medication second eval. The patient is currently on Hospital Day 1.   Chart Review from last 24 hours:  The patient's chart was reviewed and nursing notes were reviewed. The patient's case was discussed in multidisciplinary team meeting. Per [facility] patient is compliant with Zyprexa  Zydis p.o. twice daily but refusing metoprolol  for treatment of hypertension secondary to paranoia and psychosis.  No as needed medication for agitation needed or given, no as needed trazodone  used for sleep.  Information Obtained Today During Patient Interview: The patient was seen and evaluated on the unit. On assessment today the patient sitting in the dayroom refusing to get up and be interviewed in his room related to paranoia and psychosis.  During evaluation in the dayroom he was noted to have poor eye contact he tries to answer questions and limited linear manner but quickly turns disorganized he tells me that he went to the hospital "for pain in my spine and they sent me  here" then he goes on in a disorganized paranoid manner noting "it is all a game federal file" when asked him if he was treated with Haldol  before he responds "I got Haldol  in prison it gave me hives I cannot take it" when I ask him why he received Haldol  in prison he gets paranoid and notes "it is in my federal file" "you are here to offend me I am trying to help you" when asking him if he has any history of hypertension he denies then gets disorganized noting "coming from my family I am back on the street", he denies AVH but was reported by staff to be responding to stimuli and soon after I spoke to him he was observed by this provider laughing inappropriately in the dayroom.   Sleep  Fair sleep reported by staff and patient  Principal Problem: Chronic paranoid schizophrenia (HCC) Diagnosis: Principal Problem:   Chronic paranoid schizophrenia [facility]    Past Psychiatric History:  Prior Psychiatric diagnoses: Schizophrenia Past Psychiatric Hospitalizations:  Multiple emergency room presentations for SI in the past, this is his second hospitalization, last hospitalization was in April for few days. History of self mutilation: None noted Past suicide attempts: None noted Past history of HI, violent or aggressive behavior: Unknown   Past Psychiatric medications trials: Responded to Seroquel  during previous hospitalization, was discharged on Seroquel  25 mg twice daily and 100 mg at bedtime He tells me he was treated with Haldol  in prison before and had hives secondary to it but no recorded allergy noted, questionable reliability of patient's information given ongoing psychosis.  History of ECT/TMS: None noted   Outpatient psychiatric Follow up: Noncompliant Prior Outpatient Therapy: Noncompliant  Past  Medical History:  Past Medical History:  Diagnosis Date   Hypertension    Schizophrenia (HCC)    History reviewed. No pertinent surgical history. Family History: History reviewed. No  pertinent family history. Family Psychiatric  History:  Psychiatric illness: Per chart review patient reported previously that he has no family history of mental illness or suicide.  Social History:  Living situation: Homeless, was recently discharged from the hospital to homeless shelter Social support: Poor Marital Status: Unknown Children: Unknown Education: Unable to assess Employment: Camera operator service: Unable to assess Legal history: Patient reports was not present before but unable to assess for any further details Trauma: Unable to assess Access to guns: Unable to assess Substance Use History: Unable to assess substance use, UDS positive for cocaine and marijuana prior to this admission.  Prior to last admission about a week ago patient was positive to cocaine only, during previous presentations to emergency room UDS were negative.  Current Medications: Current Facility-Administered Medications  Medication Dose Route Frequency Provider Last Rate Last Admin   acetaminophen  (TYLENOL ) tablet 650 mg  650 mg Oral Q6H PRN [name], [name], PMHNP   650 mg at 11/03/23 2044   alum & mag hydroxide-simeth (MAALOX/MYLANTA) 200-200-20 MG/5ML suspension 30 mL  30 mL Oral Q4H PRN [name], [name], PMHNP       amLODipine (NORVASC) tablet 5 mg  5 mg Oral Daily Cylus Douville, MD       cloNIDine  (CATAPRES ) tablet 0.1 mg  0.1 mg Oral Q6H PRN Da Michelle, MD       haloperidol  (HALDOL ) tablet 5 mg  5 mg Oral TID PRN [name], [name], PMHNP       And   diphenhydrAMINE  (BENADRYL ) capsule 50 mg  50 mg Oral TID PRN [name], [name], PMHNP       haloperidol  lactate (HALDOL ) injection 5 mg  5 mg Intramuscular TID PRN [name], [name], PMHNP       And   diphenhydrAMINE  (BENADRYL ) injection 50 mg  50 mg Intramuscular TID PRN [name], [name], PMHNP       And   LORazepam  (ATIVAN ) injection 2 mg  2 mg Intramuscular TID PRN [name], [name],  PMHNP       haloperidol  lactate (HALDOL ) injection 10 mg  10 mg Intramuscular TID PRN [name], [name], PMHNP       And   diphenhydrAMINE  (BENADRYL ) injection 50 mg  50 mg Intramuscular TID PRN [name], [name], PMHNP       And   LORazepam  (ATIVAN ) injection 2 mg  2 mg Intramuscular TID PRN [name], [name], PMHNP       hydrOXYzine  (ATARAX ) tablet 25 mg  25 mg Oral TID PRN [name], [name], PMHNP       magnesium  hydroxide (MILK OF MAGNESIA) suspension 30 mL  30 mL Oral Daily PRN [name], [name], PMHNP       OLANZapine  zydis (ZYPREXA ) disintegrating tablet 10 mg  10 mg Oral BID Linnie Riches, Angelus Hoopes, MD       Or   OLANZapine  (ZYPREXA ) injection 5 mg  5 mg Intramuscular BID Sonoma Firkus, MD       traZODone  (DESYREL ) tablet 50 mg  50 mg Oral QHS PRN [name], [name], PMHNP        Lab Results: No results found for this or any previous visit (from the past 48 hours).  Blood Alcohol level:  Lab Results  Component Value Date   [facility] <15 11/02/2023   ETH <  10 05/17/2023    Metabolic Disorder Labs: Lab Results  Component Value Date   HGBA1C 5.0 10/27/2023   MPG 96.8 10/27/2023   No results found for: "PROLACTIN" Lab Results  Component Value Date   CHOL 152 10/19/2023   TRIG 144 10/19/2023   HDL 57 10/19/2023   CHOLHDL 2.7 10/19/2023   VLDL 29 10/19/2023   LDLCALC 66 10/19/2023    Physical Findings: AIMS: Facial and Oral Movements Muscles of Facial Expression: None Lips and Perioral Area: None Jaw: None Tongue: None,Extremity Movements Upper (arms, wrists, hands, fingers): None Lower (legs, knees, ankles, toes): None, Trunk Movements Neck, shoulders, hips: None, Global Judgements Severity of abnormal movements overall : None Incapacitation due to abnormal movements: None Patient's awareness of abnormal movements: No Awareness, Dental Status Current problems with teeth and/or dentures?: No Does patient usually wear dentures?:  No Edentia?: No  CIWA:    COWS:     Musculoskeletal: Strength & Muscle Tone: within normal limits Gait & Station: normal Patient leans: N/A  Psychiatric Specialty Exam:  General Appearance: Appears at stated age, unkempt and disheveled   Behavior: Irritable and agitated but slightly less than yesterday   Psychomotor Activity: Mild to moderate psychomotor agitation noted   Eye Contact: Poor Speech: Increased tone and volume, increased amount at times     Mood: Irritable and agitated Affect: Congruent   Thought Process: Minimal linear organized responses but quickly turns disorganized, nonsensical Descriptions of Associations: Disorganized Thought Content: Hallucinations: Responding to stimuli laughing inappropriately, probably hallucinating Delusions: Appears paranoid and delusional  Suicidal Thoughts: Denies passive or active SI, intention, plan  Homicidal Thoughts: Agitated, denies HI, intention, plan    Alertness/Orientation: Alert and partially oriented   Insight: poor Judgment: poor   Memory: Poor   Executive Functions  Concentration: Poor Attention Span: Easily distractible Recall: Poor Fund of Knowledge: Limited   Assets  Assets: Manufacturing systems engineer; Desire for Improvement    Physical Exam: Physical Exam Vitals and nursing note reviewed.    Review of Systems  All other systems reviewed and are negative.  Blood pressure (!) 136/95, pulse 66, temperature (!) 97.5 F (36.4 C), temperature source Oral, resp. rate 18, height 6\' 2"  (1.88 m), weight 98.4 kg, SpO2 100%. Body mass index is 27.86 kg/m.   Treatment Plan Summary: Daily contact with patient to assess and evaluate symptoms and progress in treatment and Medication management  ASSESSMENT:  Diagnoses / Active Problems: Principal Problem: Chronic paranoid schizophrenia (HCC) Diagnosis: Principal Problem:   Chronic paranoid schizophrenia (HCC)   PLAN: Safety and Monitoring:              -- Involuntary admission to inpatient psychiatric unit for safety, stabilization and treatment             -- Daily contact with patient to assess and evaluate symptoms and progress in treatment             -- Patient's case to be discussed in multi-disciplinary team meeting             -- Observation Level : q15 minute checks             -- Vital signs:  q12 hours             -- Precautions: suicide, elopement, and assault   2. Medications:              Given overt psychosis, paranoia and disorganized thought process at time of admission with patient refusing p.o.  medications:             Continue forced meds orders as follows , titrate Zyprexa  Zydis from 5 mg to 10 mg p.o. twice daily or Zyprexa  5 mg IM twice daily if refusing p.o.             Will follow efficacy and safety and adjust dosing accordingly             Patient had history of good response to Seroquel  in the past, unfortunately decompensation was resulted from lack of compliance as well as using cocaine, will follow and monitor may benefit from switching to Haldol  or risperidone and potentially LAI to promote compliance and long-term, patient reports Haldol  gave him hives in the past I am unsure how reliable this information is, will follow.             Continue agitation protocol and monitor --  The risks/benefits/side-effects/alternatives to this medication were discussed in detail with the patient and time was given for questions. The patient consents to medication trial.                -- Metabolic profile and EKG monitoring obtained while on an atypical antipsychotic (BMI: Lipid Panel: HbgA1c: QTc:)                  Continue clonidine  as needed for elevated blood pressure and encourage compliance             Patient has been refusing metoprolol , will DC  Start Norvasc 5 mg daily for hypertension and encourage compliance, will follow.                           3. Labs Reviewed: CMP no significant abnormalities noted except  for elevated BUN 28, CBC within normal level except for slightly elevated white blood cell count 10.6, UDS positive for cocaine and THC on 5/2, UDS positive for cocaine on 4/23, UDS completely negative on 4/19.  TSH within normal level 4/26, hemoglobin A1c 5.0 on 4/26, lipid panel within normal level on 4/18 EKG 5/2 QTc 442      Lab ordered: None at this time   4. Tobacco Use Disorder             -- Patient refused starting NicoDerm patch, will follow             -- Smoking cessation encouraged   5. Group and Therapy: -- Encouraged patient to participate in unit milieu and in scheduled group therapies              --Substance Use counseling: As patient gradually improved cognitively will counsel regarding need to abstain completely from cocaine after discharge.   6. Discharge Planning:              -- Social work and case management to assist with discharge planning and identification of hospital follow-up needs prior to discharge             -- Estimated LOS: 5-7 days             -- Discharge Concerns: Need to establish a safety plan; Medication compliance and effectiveness             -- Discharge Goals: Return home with outpatient referrals for mental health follow-up including medication management/psychotherapy         Total Time Spent in Direct Patient Care:  I personally spent 35 minutes on  the unit in direct patient care. The direct patient care time included face-to-face time with the patient, reviewing the patient's chart, communicating with other professionals, and coordinating care. Greater than 50% of this time was spent in counseling or coordinating care with the patient regarding goals of hospitalization, psycho-education, and discharge planning needs.   Kalsey Lull Linnie Riches, MD 11/04/2023, 10:09 AM

## 2023-11-04 NOTE — Progress Notes (Signed)
   11/03/23 2045  Psych Admission Type (Psych Patients Only)  Admission Status Involuntary  Psychosocial Assessment  Patient Complaints Irritability  Eye Contact Brief;Intense  Facial Expression Flat  Affect Irritable  Speech Tangential;Pressured  Interaction Cautious;Guarded;Minimal;Forwards little  Motor Activity Restless  Appearance/Hygiene In scrubs  Behavior Characteristics Restless  Mood Apprehensive;Preoccupied  Thought Process  Coherency Concrete thinking;Tangential  Content Preoccupation  Delusions Paranoid  Perception Hallucinations  Hallucination None reported or observed;Auditory  Judgment Poor  Confusion Mild  Danger to Self  Current suicidal ideation? Denies  Self-Injurious Behavior No self-injurious ideation or behavior indicators observed or expressed   Agreement Not to Harm Self Yes  Description of Agreement Verbal  Danger to Others  Danger to Others None reported or observed

## 2023-11-04 NOTE — Group Note (Signed)
 Date:  11/04/2023 Time:  8:59 PM  Group Topic/Focus:  Wrap-Up Group:   The focus of this group is to help patients review their daily goal of treatment and discuss progress on daily workbooks.    Participation Level:  Active  Participation Quality:  Appropriate  Affect:  Appropriate  Cognitive:  Appropriate  Insight: Appropriate  Engagement in Group:  Engaged  Modes of Intervention:  Education and Exploration  Additional Comments:  Patient attended and participated in group tonight. He reports that today he learnt that he should try to see things from a different point of view.  Eugena Herter Dacosta 11/04/2023, 8:59 PM

## 2023-11-05 ENCOUNTER — Encounter (HOSPITAL_COMMUNITY): Payer: Self-pay

## 2023-11-05 DIAGNOSIS — F200 Paranoid schizophrenia: Secondary | ICD-10-CM | POA: Diagnosis not present

## 2023-11-05 MED ORDER — OLANZAPINE 10 MG IM SOLR: 5.0000 mg | Freq: Two times a day (BID) | INTRAMUSCULAR | Status: DC | PRN

## 2023-11-05 MED ORDER — OLANZAPINE 10 MG PO TBDP
10.0000 mg | ORAL_TABLET | Freq: Two times a day (BID) | ORAL | Status: DC | PRN
  Administered 2023-11-05 – 2023-11-10 (×2): 10 mg via ORAL
  Filled 2023-11-05 (×2): qty 1

## 2023-11-05 NOTE — Progress Notes (Signed)
 Recreation Therapy Notes  INPATIENT RECREATION THERAPY ASSESSMENT  Patient Details Name: Aaron Rubio MRN: [id number] DOB: 18-Mar-1981 Today's Date: 11/05/2023       Information Obtained From: Patient  Able to Participate in Assessment/Interview: Yes  Patient Presentation: Alert  Reason for Admission (Per Patient): Patient Unable to Identify  Patient Stressors: Other (Comment) ("finding out that after the depression, finding there is a society that was developed")  Coping Skills:   TV, Exercise, Music, Art, Talk, Prayer, Substance Abuse, Avoidance, Read  Leisure Interests (2+):  Lonne Roan - Other (Comment) ("sit observe nature")  Frequency of Recreation/Participation: Other (Comment) ("a lot")  Awareness of Community Resources:  Yes  Community Resources:  [employer], Research scientist (physical sciences), [employer]  Current Use: Yes  If no, Barriers?:    Expressed Interest in [employer] Information: No  [employer] of Residence:  Engineer, technical sales  Patient Main Form of Transportation: Therapist, music (Bike, [place] [employer])  Patient Strengths:  "better outlook within myself"  Patient Identified Areas of Improvement:  "to improve; improvement is for every minute of the day"  Patient Goal for Hospitalization:  "find out why I'm being ridiculed by others that are great"  Current SI (including self-harm):  No  Current HI:  No  Current AVH: No  Staff Intervention Plan: Group Attendance, Collaborate with Interdisciplinary Treatment Team  Consent to Intern Participation: N/A   Garrie Woodin-McCall, LRT,CTRS Giavanni Zeitlin A Kaito Schulenburg-McCall 11/05/2023, 2:49 PM

## 2023-11-05 NOTE — Plan of Care (Signed)

## 2023-11-05 NOTE — BH IP Treatment Plan (Unsigned)
 Interdisciplinary Treatment and Diagnostic Plan Update  11/05/2023 Time of Session: [address] Aaron Rubio MRN: [id number]  Principal Diagnosis: Chronic paranoid schizophrenia [name])  Secondary Diagnoses: Principal Problem:   Chronic paranoid schizophrenia (HCC)   Current Medications:  Current Facility-Administered Medications  Medication Dose Route Frequency Provider Last Rate Last Admin   acetaminophen  (TYLENOL ) tablet 650 mg  650 mg Oral Q6H PRN [name], [name], PMHNP   650 mg at 11/03/23 2044   alum & mag hydroxide-simeth (MAALOX/MYLANTA) 200-200-20 MG/5ML suspension 30 mL  30 mL Oral Q4H PRN [name], [name], PMHNP       amLODipine (NORVASC) tablet 5 mg  5 mg Oral Daily Attiah, Nadir, MD   5 mg at 11/05/23 0841   cloNIDine  (CATAPRES ) tablet 0.1 mg  0.1 mg Oral Q6H PRN Alver Jobs, MD       hydrOXYzine  (ATARAX ) tablet 25 mg  25 mg Oral TID PRN [name], [name], PMHNP   25 mg at 11/04/23 2334   magnesium  hydroxide (MILK OF MAGNESIA) suspension 30 mL  30 mL Oral Daily PRN [name], [name], PMHNP       OLANZapine  zydis (ZYPREXA ) disintegrating tablet 10 mg  10 mg Oral BID Linnie Riches, Nadir, MD   10 mg at 11/05/23 2130   Or   OLANZapine  (ZYPREXA ) injection 5 mg  5 mg Intramuscular BID Attiah, Nadir, MD       OLANZapine  (ZYPREXA ) injection 5 mg  5 mg Intramuscular BID PRN Linnie Riches, Nadir, MD       OLANZapine  zydis (ZYPREXA ) disintegrating tablet 10 mg  10 mg Oral BID PRN Linnie Riches, Nadir, MD       traZODone  (DESYREL ) tablet 50 mg  50 mg Oral QHS PRN [name], [name], PMHNP   50 mg at 11/04/23 2334   PTA Medications: Medications Prior to Admission  Medication Sig Dispense Refill Last Dose/Taking   mirtazapine  (REMERON ) 15 MG tablet Take 15 mg by mouth at bedtime.   Taking   QUEtiapine  (SEROQUEL ) 25 MG tablet Take 25 mg by mouth 2 (two) times daily.   Taking   cyclobenzaprine  (FLEXERIL ) 10 MG tablet Take 0.5-1 tablets (5-10 mg total) by mouth at bedtime  as needed for muscle spasms. 7 tablet 0     Patient Stressors:    Patient Strengths:    Treatment Modalities: Medication Management, Group therapy, Case management,  1 to 1 session with clinician, Psychoeducation, Recreational therapy.   Physician Treatment Plan for Primary Diagnosis: Chronic paranoid schizophrenia (HCC) Long Term Goal(s): Improvement in symptoms so as ready for discharge   Short Term Goals: Ability to identify changes in lifestyle to reduce recurrence of condition will improve Ability to verbalize feelings will improve Ability to disclose and discuss suicidal ideas Ability to demonstrate self-control will improve Ability to identify and develop effective coping behaviors will improve  Medication Management: Evaluate patient's response, side effects, and tolerance of medication regimen.  Therapeutic Interventions: 1 to 1 sessions, Unit Group sessions and Medication administration.  Evaluation of Outcomes: Not Progressing  Physician Treatment Plan for Secondary Diagnosis: Principal Problem:   Chronic paranoid schizophrenia (HCC)  Long Term Goal(s): Improvement in symptoms so as ready for discharge   Short Term Goals: Ability to identify changes in lifestyle to reduce recurrence of condition will improve Ability to verbalize feelings will improve Ability to disclose and discuss suicidal ideas Ability to demonstrate self-control will improve Ability to identify and develop effective coping behaviors will improve     Medication Management: Evaluate patient's response, side  effects, and tolerance of medication regimen.  Therapeutic Interventions: 1 to 1 sessions, Unit Group sessions and Medication administration.  Evaluation of Outcomes: Not Progressing   RN Treatment Plan for Primary Diagnosis: Chronic paranoid schizophrenia (HCC) Long Term Goal(s): Knowledge of disease and therapeutic regimen to maintain health will improve  Short Term Goals: Ability to  remain free from injury will improve, Ability to participate in decision making will improve, Ability to verbalize feelings will improve, and Ability to identify and develop effective coping behaviors will improve  Medication Management: RN will administer medications as ordered by provider, will assess and evaluate patient's response and provide education to patient for prescribed medication. RN will report any adverse and/or side effects to prescribing provider.  Therapeutic Interventions: 1 on 1 counseling sessions, Psychoeducation, Medication administration, Evaluate responses to treatment, Monitor vital signs and CBGs as ordered, Perform/monitor CIWA, COWS, AIMS and Fall Risk screenings as ordered, Perform wound care treatments as ordered.  Evaluation of Outcomes: Not Progressing   LCSW Treatment Plan for Primary Diagnosis: Chronic paranoid schizophrenia (HCC) Long Term Goal(s): Safe transition to appropriate next level of care at discharge, Engage patient in therapeutic group addressing interpersonal concerns.  Short Term Goals: Engage patient in aftercare planning with referrals and resources, Increase emotional regulation, and Identify triggers associated with mental health/substance abuse issues  Therapeutic Interventions: Assess for all discharge needs, 1 to 1 time with Social worker, Explore available resources and support systems, Assess for adequacy in community support network, Educate family and significant other(s) on suicide prevention, Complete Psychosocial Assessment, Interpersonal group therapy.  Evaluation of Outcomes: Not Progressing   Progress in Treatment: Attending groups: Yes. Participating in groups: Yes. Taking medication as prescribed: Yes. Toleration medication: Yes. Family/Significant other contact made: No, will contact:  Declined consents Patient understands diagnosis: Yes. Discussing patient identified problems/goals with staff: Yes. Medical problems  stabilized or resolved: Yes. Denies suicidal/homicidal ideation: Yes. Issues/concerns per patient self-inventory: No. Other: N/a  New problem(s) identified: No, Describe:  None  New Short Term/Long Term Goal(s): detox, medication management for mood stabilization; elimination of SI thoughts; development of comprehensive mental wellness/sobriety plan  Patient Goals: "try to reach out to find someone who can communicate what's happening"  Discharge Plan or Barriers: Patient recently admitted. CSW will continue to follow and assess for appropriate referrals and possible discharge planning.    Reason for Continuation of Hospitalization: Delusions  Hallucinations Medication stabilization Other; describe Mood stabilization, discharge planning  Estimated Length of Stay: 5-7 DAYS  Last 3 [place] Suicide Severity Risk Score: Flowsheet Row Ad[facility] (Current[facility] Most recent reading at 11/03/2023 12:45 AM ED from 11/02/2023 in [facility] Department at [facility] Most recent reading at 11/02/2023  6:29 AM ED from 11/02/2023 in [facility] Most recent reading at 11/02/2023  5:17 AM  C-SSRS RISK CATEGORY High Risk Low Risk Low Risk       Last PHQ 2/9 Scores:     No data to display          Scribe for Treatment Team: Qasim Diveley N Ashtyn Freilich, LCSW 11/05/2023 3:09 PM

## 2023-11-05 NOTE — Group Note (Signed)
 LCSW Group Therapy Note   Group Date: 11/05/2023 Start Time: 1300 End Time: 1400   Participation:  did not attend  Type of Therapy:  Group Therapy  Topic:  [facility]:  A Safe Space for Grief  Objective:  To create a compassionate group space where participants can process grief, learn about its stages, and explore personal rituals to honor lost loved ones.   Goals:  1. Provide a safe and supportive space where participants feel comfortable sharing their feelings and experiences of grief without judgment.  2. Educate participants about the stages of grief and emphasize that there is no "right" way to grieve or a fixed timeline for healing.  3. Introduce the concept of rituals to process grief, allowing individuals to honor their loved ones in a personal and meaningful way.  Summary:  In [facility]: A Safe Space for Grief, participants explored the unique and personal nature of grief, with a focus on the five stages (denial, anger, bargaining, depression, acceptance) as a flexible, non-linear process. The group introduced meaningful rituals - like lighting candles or memory walks - as tools for honoring loved ones and managing difficult emotions. Through shared experiences, participants received emotional support, practiced self-care, and reflected on gratitude, emphasizing that healing has no fixed timeline.  Therapeutic Modalities Used: Cognitive Behavioral Therapy (CBT):   Psychoeducation on grief stages, Challenging unhelpful thoughts (e.g., "I should be over this") Dialectical Behavior Therapy (DBT):  Mindfulness (staying present with grief-related emotions), Distress tolerance (using rituals as grounding tools) Group Therapy/Supportive Elements:  Emotional validation and peer support, Encouragement of open sharing in a nonjudgmental space   Amos Balint, LCSWA 11/05/2023  5:44 PM

## 2023-11-05 NOTE — Progress Notes (Signed)
 Conversation with patient:  Patient said that he was unhoused in [place], [place] before he was discharged here.  He said he will go to [facility] at discharge.  CSW gave patient a list of shelter resources and encouraged him to call.   Aashritha Miedema, LCSWA 11/05/2023

## 2023-11-05 NOTE — Plan of Care (Signed)
   Problem: Education: Goal: Emotional status will improve Outcome: Not Progressing Goal: Mental status will improve Outcome: Not Progressing
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
## 2023-11-05 NOTE — Group Note (Signed)
 Recreation Therapy Group Note   Group Topic:Stress Management  Group Date: 11/05/2023 Start Time: 1030 End Time: 1105 Facilitators: Porchia Sinkler-McCall, LRT,CTRS Location: [address] Dayroom   Group Topic:Stress Management  Group Date: 11/05/2023 Start Time: 1030 End Time: 1105 Facilitators: Unika Nazareno-McCall, LRT,CTRS Location: [address] Dayroom     Group Topic: Stress Management   Goal Area(s) Addresses:  Patient will identify positive stress management techniques. Patient will identify benefits of using stress management post d/c.   Intervention: Music, Speaker   Activity: Music Therapy. LRT discussed the importance music can play helping to relieve stress and helping people to create calm spaces. Patients could request the songs of their choosing that were clean, appropriate and provided a special feeling to individual.     Education:  Stress Management, Discharge Planning.    Education Outcome: Acknowledges Education   Affect/Mood: Flat   Participation Level: Active   Participation Quality: Independent   Behavior: Responding   Speech/Thought Process: Relevant   Insight: Moderate   Judgement: Moderate   Modes of Intervention: Music   Patient Response to Interventions:  Receptive   Education Outcome:  In group clarification offered    Clinical Observations/Individualized Feedback: Pt appeared to be responding to internal stimuli during group. Pt would respond to LRT  when engaged. Pt would sing in a soft voice at times during group session.     Plan: Continue to engage patient in RT group sessions 2-3x/week.   Gwendoline Judy-McCall, LRT,CTRS 11/05/2023 2:09 PM

## 2023-11-05 NOTE — Group Note (Signed)
 Date:  11/05/2023 Time:  8:45 PM  Group Topic/Focus:  Wrap-Up Group:   The focus of this group is to help patients review their daily goal of treatment and discuss progress on daily workbooks.    Additional Comments:  Pt was encouraged, but opted out of attending wrap up group this evening.   Eligah Grow 11/05/2023, 8:45 PM

## 2023-11-05 NOTE — Progress Notes (Signed)
   11/05/23 0000  Psych Admission Type (Psych Patients Only)  Admission Status Voluntary  Psychosocial Assessment  Patient Complaints Agitation  Eye Contact Intense;Avertive  Facial Expression Anxious;Animated  Affect Labile  Speech Pressured  Interaction Cautious;Guarded;Assertive  Motor Activity Fidgety  Appearance/Hygiene In scrubs  Behavior Characteristics Agitated;Guarded  Mood Labile;Anxious  Thought Process  Coherency Disorganized;Circumstantial  Content Blaming others  Delusions Paranoid  Perception Hallucinations  Hallucination Auditory  Judgment Poor  Confusion UTA  Danger to Self  Current suicidal ideation? Denies  Self-Injurious Behavior No self-injurious ideation or behavior indicators observed or expressed   Agreement Not to Harm Self Yes  Danger to Others  Danger to Others None reported or observed  Danger to Others Abnormal  Harmful Behavior to others No threats or harm toward other people  Destructive Behavior No threats or harm toward property

## 2023-11-06 DIAGNOSIS — F200 Paranoid schizophrenia: Secondary | ICD-10-CM | POA: Diagnosis not present

## 2023-11-06 MED ORDER — NAPROXEN 500 MG PO TABS
500.0000 mg | ORAL_TABLET | Freq: Two times a day (BID) | ORAL | Status: AC
  Administered 2023-11-07 – 2023-11-09 (×5): 500 mg via ORAL
  Filled 2023-11-06 (×7): qty 1

## 2023-11-06 MED ORDER — OLANZAPINE 10 MG IM SOLR
10.0000 mg | Freq: Every day | INTRAMUSCULAR | Status: DC
  Filled 2023-11-06 (×5): qty 10

## 2023-11-06 MED ORDER — OLANZAPINE 10 MG IM SOLR: 5.0000 mg | Freq: Every day | INTRAMUSCULAR | Status: DC

## 2023-11-06 MED ORDER — OLANZAPINE 15 MG PO TBDP: 15.0000 mg | ORAL_TABLET | Freq: Every day | ORAL | Status: DC

## 2023-11-06 MED ORDER — GABAPENTIN 100 MG PO CAPS
200.0000 mg | ORAL_CAPSULE | Freq: Three times a day (TID) | ORAL | Status: DC
  Administered 2023-11-06 – 2023-11-07 (×3): 200 mg via ORAL
  Filled 2023-11-06 (×10): qty 2

## 2023-11-06 MED ORDER — OLANZAPINE 10 MG PO TBDP
20.0000 mg | ORAL_TABLET | Freq: Every day | ORAL | Status: DC
  Administered 2023-11-06 – 2023-11-10 (×5): 20 mg via ORAL
  Filled 2023-11-06 (×8): qty 2

## 2023-11-06 MED ORDER — OLANZAPINE 10 MG PO TABS
10.0000 mg | ORAL_TABLET | Freq: Every day | ORAL | Status: DC
  Administered 2023-11-07 – 2023-11-11 (×5): 10 mg via ORAL
  Filled 2023-11-06 (×3): qty 1
  Filled 2023-11-06: qty 21
  Filled 2023-11-06: qty 1
  Filled 2023-11-06: qty 21
  Filled 2023-11-06 (×5): qty 1

## 2023-11-06 NOTE — Progress Notes (Signed)
   11/05/23 2034  Psych Admission Type (Psych Patients Only)  Admission Status Voluntary  Psychosocial Assessment  Patient Complaints Anxiety;Depression  Eye Contact Fair;Avertive  Facial Expression Animated  Affect Flat  Speech Logical/coherent  Interaction Assertive  Motor Activity Slow  Appearance/Hygiene In scrubs  Behavior Characteristics Guarded  Mood Depressed  Thought Process  Coherency Blocking  Content WDL  Delusions None reported or observed  Perception Hallucinations  Hallucination Auditory  Judgment Poor  Confusion Mild  Danger to Self  Current suicidal ideation? Denies  Self-Injurious Behavior No self-injurious ideation or behavior indicators observed or expressed   Agreement Not to Harm Self Yes  Description of Agreement verbal  Danger to Others  Danger to Others None reported or observed

## 2023-11-06 NOTE — Plan of Care (Signed)
  Problem: Education: Goal: Emotional status will improve Outcome: Progressing Goal: Verbalization of understanding the information provided will improve Outcome: Progressing   Problem: Activity: Goal: Sleeping patterns will improve Outcome: Progressing

## 2023-11-06 NOTE — Progress Notes (Signed)
   11/06/23 1000  Psych Admission Type (Psych Patients Only)  Admission Status Voluntary  Psychosocial Assessment  Patient Complaints Anxiety;Depression  Eye Contact Fair  Facial Expression Animated  Affect Flat  Speech Logical/coherent  Interaction Assertive  Motor Activity Slow  Appearance/Hygiene Unremarkable  Behavior Characteristics Guarded  Mood Depressed  Thought Process  Coherency Blocking  Content Blaming others  Delusions None reported or observed  Perception Hallucinations  Hallucination Auditory  Judgment Limited  Confusion Mild  Danger to Self  Current suicidal ideation? Denies  Agreement Not to Harm Self Yes  Description of Agreement verbal  Danger to Others  Danger to Others None reported or observed

## 2023-11-06 NOTE — Progress Notes (Addendum)
 [facility] MD Progress Note  11/06/2023 7:23 PM Leeandrew Regal Stanifer  MRN:  [id number]   Reason for Admission:  Antwione Owusu is a 43 y.o., male with a past psychiatric history significant for paranoid schizophrenia who presents to the [facility] from [facility] emergency department for evaluation and management of worsening psychosis.  According to outside records, the patient presented to emergency room paranoid and responding to stimuli   Initial assessment on 11/03/2023, patient was evaluated on the inpatient unit, the patient presents disorganized and psychotic unable to participate in history taking, nonsensical thought process and speech noted, presenting paranoid refusing to be on any medications for blood pressure or for psychosis even when encouraged, laughing inappropriately during evaluation, loud and agitated verbally, delusional, unable to answer questions in linear informative manner.  Patient was evaluated for forced medication second eval. The patient is currently on Hospital Day 3.   Information Obtained Today During Patient Interview: Patient denies SI, HI or AVH however is reporting paranoid persecutory delusion that someone has stolen his identity since he was released from jail. Patient tolerating Zyprexa  well and denies side effects. Patient is homeless and probably will be discharged again to homeless shelter, questionable if he will benefit from referral to ACT team.  Sleep  Fair sleep reported by staff and patient  Principal Problem: Chronic paranoid schizophrenia (HCC) Diagnosis: Principal Problem:   Chronic paranoid schizophrenia [facility])    Past Psychiatric History:  Prior Psychiatric diagnoses: Schizophrenia Past Psychiatric Hospitalizations:  Multiple emergency room presentations for SI in the past, this is his second hospitalization, last hospitalization was in April for few days. History of self mutilation: None noted Past suicide attempts: None  noted Past history of HI, violent or aggressive behavior: Unknown   Past Psychiatric medications trials: Responded to Seroquel  during previous hospitalization, was discharged on Seroquel  25 mg twice daily and 100 mg at bedtime He tells me he was treated with Haldol  in prison before and had hives secondary to it but no recorded allergy noted, questionable reliability of patient's information given ongoing psychosis.  History of ECT/TMS: None noted   Outpatient psychiatric Follow up: Noncompliant Prior Outpatient Therapy: Noncompliant  Past Medical History:  Past Medical History:  Diagnosis Date   Hypertension    Schizophrenia (HCC)    History reviewed. No pertinent surgical history. Family History: History reviewed. No pertinent family history. Family Psychiatric  History:  Psychiatric illness: Per chart review patient reported previously that he has no family history of mental illness or suicide.  Social History:  Living situation: Homeless, was recently discharged from the hospital to homeless shelter Social support: Poor Marital Status: Unknown Children: Unknown Education: Unable to assess Employment: Camera operator service: Unable to assess Legal history: Patient reports was not present before but unable to assess for any further details Trauma: Unable to assess Access to guns: Unable to assess Substance Use History: Unable to assess substance use, UDS positive for cocaine and marijuana prior to this admission.  Prior to last admission about a week ago patient was positive to cocaine only, during previous presentations to emergency room UDS were negative.  Current Medications: Current Facility-Administered Medications  Medication Dose Route Frequency Provider Last Rate Last Admin   acetaminophen  (TYLENOL ) tablet 650 mg  650 mg Oral Q6H PRN [name], [name], PMHNP   650 mg at 11/06/23 1503   alum & mag hydroxide-simeth (MAALOX/MYLANTA) 200-200-20 MG/5ML suspension  30 mL  30 mL Oral Q4H PRN [name], [name], PMHNP  amLODipine (NORVASC) tablet 5 mg  5 mg Oral Daily Linnie Riches, Nadir, MD   5 mg at 11/06/23 0743   cloNIDine  (CATAPRES ) tablet 0.1 mg  0.1 mg Oral Q6H PRN Alver Jobs, MD   0.1 mg at 11/06/23 1914   gabapentin (NEURONTIN) capsule 200 mg  200 mg Oral TID Akaiya Touchette, MD   200 mg at 11/06/23 1638   hydrOXYzine  (ATARAX ) tablet 25 mg  25 mg Oral TID PRN [name], [name], PMHNP   25 mg at 11/04/23 2334   magnesium  hydroxide (MILK OF MAGNESIA) suspension 30 mL  30 mL Oral Daily PRN [name], [name], PMHNP       naproxen  (NAPROSYN ) tablet 500 mg  500 mg Oral BID WC Klare Criss, MD       OLANZapine  zydis (ZYPREXA ) disintegrating tablet 10 mg  10 mg Oral BID Linnie Riches, Nadir, MD   10 mg at 11/06/23 0454   Or   OLANZapine  (ZYPREXA ) injection 5 mg  5 mg Intramuscular BID Linnie Riches, Nadir, MD       OLANZapine  (ZYPREXA ) injection 5 mg  5 mg Intramuscular BID PRN Linnie Riches, Nadir, MD       OLANZapine  zydis (ZYPREXA ) disintegrating tablet 10 mg  10 mg Oral BID PRN Linnie Riches, Nadir, MD   10 mg at 11/05/23 1728   traZODone  (DESYREL ) tablet 50 mg  50 mg Oral QHS PRN [name], [name], PMHNP   50 mg at 11/05/23 2122    Lab Results: No results found for this or any previous visit (from the past 48 hours).  Blood Alcohol level:  Lab Results  Component Value Date   [facility] <15 11/02/2023   ETH <10 05/17/2023    Metabolic Disorder Labs: Lab Results  Component Value Date   HGBA1C 5.0 10/27/2023   MPG 96.8 10/27/2023   No results found for: "PROLACTIN" Lab Results  Component Value Date   CHOL 152 10/19/2023   TRIG 144 10/19/2023   HDL 57 10/19/2023   CHOLHDL 2.7 10/19/2023   VLDL 29 10/19/2023   LDLCALC 66 10/19/2023    Physical Findings: AIMS: Facial and Oral Movements Muscles of Facial Expression: None Lips and Perioral Area: None Jaw: None Tongue: None,Extremity Movements Upper (arms, wrists, hands, fingers): None Lower  (legs, knees, ankles, toes): None, Trunk Movements Neck, shoulders, hips: None, Global Judgements Severity of abnormal movements overall : None Incapacitation due to abnormal movements: None Patient's awareness of abnormal movements: No Awareness, Dental Status Current problems with teeth and/or dentures?: No Does patient usually wear dentures?: No Edentia?: No  CIWA:    COWS:     Musculoskeletal: Strength & Muscle Tone: within normal limits Gait & Station: normal Patient leans: N/A  Psychiatric Specialty Exam:  General Appearance: Appears at stated age, unkempt and disheveled   Behavior: Remains restless but much less compared to the past few days   Psychomotor Activity: No psychomotor agitation or retardation noted   Eye Contact: Moved yet limited Speech: Normal tone and volume, normal amount with very occasional pushed speech later in the interview     Mood: "All right" Affect: Restricted   Thought Process: Linear and organized primarily but later derailing with some disorganized responses   Descriptions of Associations: Disorganized at times Thought Content: Hallucinations: Denies AVH, less episodes of responding to stimuli reported Delusions: Appears less paranoid and less delusional Suicidal Thoughts: Denies passive or active SI, intention, plan  Homicidal Thoughts: Denies HI intention or plan   Alertness/Orientation: Alert and partially oriented   Insight:  poor Judgment: poor   Memory: Poor   Art therapist  Concentration: Poor Attention Span: Easily distractible Recall: Poor Fund of Knowledge: Limited   Assets  Assets: Manufacturing systems engineer; Desire for Improvement    Physical Exam: Physical Exam Vitals and nursing note reviewed.    Review of Systems  All other systems reviewed and are negative.  Blood pressure (!) 141/107, pulse 96, temperature 98.1 F (36.7 C), temperature source Oral, resp. rate 18, height 6\' 2"  (1.88 m), weight 98.4  kg, SpO2 99%. Body mass index is 27.86 kg/m.   Treatment Plan Summary: 5/6: patient with residual paranoid persecutory delusions, will increase nighttime Zyprexa .   Daily contact with patient to assess and evaluate symptoms and progress in treatment and Medication management  ASSESSMENT:  Diagnoses / Active Problems: Principal Problem: Chronic paranoid schizophrenia (HCC) Diagnosis: Principal Problem:   Chronic paranoid schizophrenia (HCC)   PLAN: Safety and Monitoring:             -- Involuntary admission to inpatient psychiatric unit for safety, stabilization and treatment             -- Daily contact with patient to assess and evaluate symptoms and progress in treatment             -- Patient's case to be discussed in multi-disciplinary team meeting             -- Observation Level : q15 minute checks             -- Vital signs:  q12 hours             -- Precautions: suicide, elopement, and assault   2. Medications:              Given overt psychosis, paranoia and disorganized thought process at time of admission with patient refusing p.o. medications:          increase forced meds orders as follows , continue Zyprexa  10 mg p.o. daily and 20 mg at bedtime  or Zyprexa  10 mg IM twice daily if refusing p.o.             Will follow efficacy and safety and adjust dosing accordingly             Patient had history of good response to Seroquel  in the past, unfortunately decompensation was resulted from lack of compliance as well as using cocaine, will follow and monitor may benefit from switching to Haldol  or risperidone and potentially LAI to promote compliance and long-term, patient reports Haldol  gave him hives in the past I am unsure how reliable this information is, will follow.             Change agitation protocol given reporting history of rash secondary to Haldol  to Zyprexa  p.o. or IM as needed for psychotic agitation --  The risks/benefits/side-effects/alternatives to this  medication were discussed in detail with the patient and time was given for questions. The patient consents to medication trial.                -- Metabolic profile and EKG monitoring obtained while on an atypical antipsychotic (BMI: Lipid Panel: HbgA1c: QTc:)                  Continue clonidine  as needed for elevated blood pressure and encourage compliance             Patient has been refusing metoprolol , will DC  Continue Norvasc but titrate from 5-10 mg daily  for hypertension and encourage compliance, will follow.                           3. Labs Reviewed: CMP no significant abnormalities noted except for elevated BUN 28, CBC within normal level except for slightly elevated white blood cell count 10.6, UDS positive for cocaine and THC on 5/2, UDS positive for cocaine on 4/23, UDS completely negative on 4/19.  TSH within normal level 4/26, hemoglobin A1c 5.0 on 4/26, lipid panel within normal level on 4/18 EKG 5/2 QTc 442      Lab ordered: None at this time   4. Tobacco Use Disorder             -- Patient refused starting NicoDerm patch, will follow             -- Smoking cessation encouraged   5. Group and Therapy: -- Encouraged patient to participate in unit milieu and in scheduled group therapies              --Substance Use counseling: As patient gradually improved cognitively will counsel regarding need to abstain completely from cocaine after discharge.   6. Discharge Planning:              -- Social work and case management to assist with discharge planning and identification of hospital follow-up needs prior to discharge             -- Estimated LOS: 5-7 days             -- Discharge Concerns: Need to establish a safety plan; Medication compliance and effectiveness             -- Discharge Goals: Return home with outpatient referrals for mental health follow-up including medication management/psychotherapy         Total Time Spent in Direct Patient Care:  I personally  spent 35 minutes on the unit in direct patient care. The direct patient care time included face-to-face time with the patient, reviewing the patient's chart, communicating with other professionals, and coordinating care. Greater than 50% of this time was spent in counseling or coordinating care with the patient regarding goals of hospitalization, psycho-education, and discharge planning needs.   Bosten Newstrom, MD 11/06/2023, 7:23 PM

## 2023-11-06 NOTE — Progress Notes (Signed)
   11/06/23 2200  Psych Admission Type (Psych Patients Only)  Admission Status Voluntary  Psychosocial Assessment  Patient Complaints Anxiety;Isolation;Depression  Eye Contact Fair  Facial Expression Flat  Affect Depressed;Flat  Speech Logical/coherent  Interaction Assertive  Motor Activity Slow  Appearance/Hygiene Unremarkable  Behavior Characteristics Guarded  Mood Depressed  Thought Process  Coherency Blocking  Content WDL  Delusions None reported or observed  Perception Hallucinations  Hallucination Auditory  Judgment Limited  Confusion Mild  Danger to Self  Current suicidal ideation? Denies  Description of Suicide Plan none  Self-Injurious Behavior No self-injurious ideation or behavior indicators observed or expressed   Agreement Not to Harm Self Yes  Description of Agreement verbal  Danger to Others  Danger to Others None reported or observed  Danger to Others Abnormal  Harmful Behavior to others No threats or harm toward other people  Destructive Behavior No threats or harm toward property

## 2023-11-06 NOTE — [facility] Group Notes (Signed)
 Adult Psychoeducational Group Note  Date:  11/06/2023 Time:  11:38 AM  Group Topic/Focus:  Goals Group:   The focus of this group is to help patients establish daily goals to achieve during treatment and discuss how the patient can incorporate goal setting into their daily lives to aide in recovery.  Participation Level:  Did Not Attend  Additional Comments:  Pt was invited but did not attend orientation/goals group  Jonel Nephew 11/06/2023, 11:38 AM

## 2023-11-06 NOTE — Progress Notes (Signed)
   11/06/23 1505  Spiritual Encounters  Type of Visit Follow up  Care provided to: Patient  Referral source Chaplain assessment   I greeted Mr. Osen while rounding on unit.  Mr. Vite seemed to remember meeting me when here on prior admission. He was pleasant but reserved.  I sought to offer relational support and let him know this chaplain remains available as needed.  Skipper Dacosta L. Minetta Aly, M.Div [phone]

## 2023-11-06 NOTE — Group Note (Signed)
 Recreation Therapy Group Note   Group Topic:Animal Assisted Therapy   Group Date: 11/06/2023 Start Time: 0945 End Time: 1030 Facilitators: Genevive Printup-McCall, LRT,CTRS Location: [address] Day[address]  Animal-Assisted Activity (AAA) Program Checklist/Progress Notes Patient Eligibility Criteria Checklist & Daily Group note for Rec Tx Intervention  AAA/T Program Assumption of Risk Form signed by Patient/ or Parent Legal Guardian Yes  Patient is free of allergies or severe asthma Yes  Patient reports no fear of animals Yes  Patient reports no history of cruelty to animals Yes  Patient understands his/her participation is voluntary Yes  Patient washes hands before animal contact Yes  Patient washes hands after animal contact Yes  Education: Hand Washing, Appropriate Animal Interaction   Education Outcome: Acknowledges education.    Affect/Mood: N/A   Participation Level: Did not attend    Clinical Observations/Individualized Feedback:     Plan: Continue to engage patient in RT group sessions 2-3x/week.   Aradia Estey-McCall, LRT,CTRS 11/06/2023 1:12 PM

## 2023-11-06 NOTE — Plan of Care (Signed)

## 2023-11-06 NOTE — Group Note (Signed)
 LCSW Group Therapy Note   Group Date: 11/05/2023 Start Time: 1100 End Time: 1200   Participation:  did not attend  Type of Therapy:  Group Therapy  Topic:  Healing Hearts:  A Safe Space for Grief  Objective:  To create a compassionate group space where participants can process grief, learn about its stages, and explore personal rituals to honor lost loved ones.   Goals:  1. Provide a safe and supportive space where participants feel comfortable sharing their feelings and experiences of grief without judgment.  2. Educate participants about the stages of grief and emphasize that there is no "right" way to grieve or a fixed timeline for healing.  3. Introduce the concept of rituals to process grief, allowing individuals to honor their loved ones in a personal and meaningful way.  Summary:  In Healing Hearts: A Safe Space for Grief, participants explored the unique and personal nature of grief, with a focus on the five stages (denial, anger, bargaining, depression, acceptance) as a flexible, non-linear process. The group introduced meaningful rituals - like lighting candles or memory walks - as tools for honoring loved ones and managing difficult emotions. Through shared experiences, participants received emotional support, practiced self-care, and reflected on gratitude, emphasizing that healing has no fixed timeline.  Therapeutic Modalities Used: Cognitive Behavioral Therapy (CBT):   Psychoeducation on grief stages, Challenging unhelpful thoughts (e.g., "I should be over this") Dialectical Behavior Therapy (DBT):  Mindfulness (staying present with grief-related emotions), Distress tolerance (using rituals as grounding tools) Group Therapy/Supportive Elements:  Emotional validation and peer support, Encouragement of open sharing in a nonjudgmental space   Amos Balint, LCSWA 11/06/2023  12:31 PM

## 2023-11-06 NOTE — Progress Notes (Signed)
 [place] did not attend wrap up group.

## 2023-11-07 DIAGNOSIS — F200 Paranoid schizophrenia: Secondary | ICD-10-CM | POA: Diagnosis not present

## 2023-11-07 MED ORDER — AMLODIPINE BESYLATE 10 MG PO TABS
10.0000 mg | ORAL_TABLET | Freq: Every day | ORAL | Status: DC
  Administered 2023-11-08 – 2023-11-11 (×4): 10 mg via ORAL
  Filled 2023-11-07: qty 7
  Filled 2023-11-07 (×4): qty 1
  Filled 2023-11-07: qty 7
  Filled 2023-11-07 (×2): qty 1

## 2023-11-07 MED ORDER — GABAPENTIN 300 MG PO CAPS
300.0000 mg | ORAL_CAPSULE | Freq: Three times a day (TID) | ORAL | Status: DC
  Administered 2023-11-07 – 2023-11-11 (×11): 300 mg via ORAL
  Filled 2023-11-07: qty 21
  Filled 2023-11-07 (×2): qty 1
  Filled 2023-11-07: qty 21
  Filled 2023-11-07 (×3): qty 1
  Filled 2023-11-07 (×2): qty 21
  Filled 2023-11-07 (×12): qty 1

## 2023-11-07 NOTE — Group Note (Signed)
 Recreation Therapy Group Note   Group Topic:Other  Group Date: 11/07/2023 Start Time: 1415 End Time: 1450 Facilitators: Thanh Mottern-McCall, LRT,CTRS Location: [address] Dayroom   Activity Description/Intervention: Therapeutic Drumming. Patients with peers and staff were given the opportunity to engage in a leader facilitated [facility] Group Empowerment Drumming Circle with staff from the [employer], in partnership with [employer]. Teaching laboratory technician and trained [employer], Kathlyne Parchment leading with LRT observing and documenting intervention and pt response. This evidenced-based practice targets 7 areas of health and wellbeing in the human experience including: stress-reduction, exercise, self-expression, camaraderie/support, nurturing, spirituality, and music-making (leisure).   Goal Area(s) Addresses:  Patient will engage in pro-social way in music group.  Patient will follow directions of drum leader on the first prompt. Patient will demonstrate no behavioral issues during group.  Patient will identify if a reduction in stress level occurs as a result of participation in therapeutic drum circle.    Education: Leisure exposure, Pharmacologist, Musical expression, Discharge Planning   Affect/Mood: N/A   Participation Level: Did not attend    Clinical Observations/Individualized Feedback:     Plan: Continue to engage patient in RT group sessions 2-3x/week.   Sadeen Wiegel-McCall, LRT,CTRS 11/07/2023 4:15 PM

## 2023-11-07 NOTE — Plan of Care (Signed)
   Problem: Education: Goal: Emotional status will improve Outcome: Progressing Goal: Mental status will improve Outcome: Progressing   Problem: Activity: Goal: Sleeping patterns will improve Outcome: Progressing   Problem: Safety: Goal: Periods of time without injury will increase Outcome: Progressing

## 2023-11-07 NOTE — Progress Notes (Signed)
 [facility] MD Progress Note  11/07/2023 2:35 PM Aaron Rubio  MRN:  [id number]   Reason for Admission:  Aaron Rubio is a 43 y.o., male with a past psychiatric history significant for paranoid schizophrenia who presents to the [facility] from [facility] emergency department for evaluation and management of worsening psychosis.  According to outside records, the patient presented to emergency room paranoid and responding to stimuli   Initial assessment on 11/03/2023, patient was evaluated on the inpatient unit, the patient presents disorganized and psychotic unable to participate in history taking, nonsensical thought process and speech noted, presenting paranoid refusing to be on any medications for blood pressure or for psychosis even when encouraged, laughing inappropriately during evaluation, loud and agitated verbally, delusional, unable to answer questions in linear informative manner.  Patient was evaluated for forced medication second eval. The patient is currently on Hospital Day 4.   Information Obtained Today During Patient Interview: Patient is internally preoccupied with various paranoid persecutory delusions stating that a group of people brought him here and are sabotaging his care and stealing his identity. Patient is guarded and evasive stating "I know what's going on they had me on that hall and I know what y'all are doing" as well as multiple other cryptic statements. Tolerating increase of Zyprexa . Patient was placed under IVC and forced meds. Believes that someone is making his blood pressure go up, and controlling him. Patient with poor insight and judgement asking to be discharged to the street psychotic and sleep in a tent. Tells me he is using cocaine for pain management and that unknown people are preventing him from getting into pain management.     Principal Problem: Chronic paranoid schizophrenia (HCC) Diagnosis: Principal Problem:   Chronic paranoid  schizophrenia [facility])    Past Psychiatric History:  Prior Psychiatric diagnoses: Schizophrenia Past Psychiatric Hospitalizations:  Multiple emergency room presentations for SI in the past, this is his second hospitalization, last hospitalization was in April for few days. History of self mutilation: None noted Past suicide attempts: None noted Past history of HI, violent or aggressive behavior: Unknown   Past Psychiatric medications trials: Responded to Seroquel  during previous hospitalization, was discharged on Seroquel  25 mg twice daily and 100 mg at bedtime He tells me he was treated with Haldol  in prison before and had hives secondary to it but no recorded allergy noted, questionable reliability of patient's information given ongoing psychosis.  History of ECT/TMS: None noted   Outpatient psychiatric Follow up: Noncompliant Prior Outpatient Therapy: Noncompliant  Past Medical History:  Past Medical History:  Diagnosis Date   Hypertension    Schizophrenia (HCC)    History reviewed. No pertinent surgical history. Family History: History reviewed. No pertinent family history. Family Psychiatric  History:  Psychiatric illness: Per chart review patient reported previously that he has no family history of mental illness or suicide.  Social History:  Living situation: Homeless, was recently discharged from the hospital to homeless shelter Social support: Poor Marital Status: Unknown Children: Unknown Education: Unable to assess Employment: Camera operator service: Unable to assess Legal history: Patient reports was not present before but unable to assess for any further details Trauma: Unable to assess Access to guns: Unable to assess Substance Use History: Unable to assess substance use, UDS positive for cocaine and marijuana prior to this admission.  Prior to last admission about a week ago patient was positive to cocaine only, during previous presentations to emergency room  UDS were negative.  Current Medications: Current Facility-Administered Medications  Medication Dose Route Frequency Provider Last Rate Last Admin   acetaminophen  (TYLENOL ) tablet 650 mg  650 mg Oral Q6H PRN [name], [name], PMHNP   650 mg at 11/06/23 1503   alum & mag hydroxide-simeth (MAALOX/MYLANTA) 200-200-20 MG/5ML suspension 30 mL  30 mL Oral Q4H PRN [name], [name], PMHNP       [START ON 11/08/2023] amLODipine (NORVASC) tablet 10 mg  10 mg Oral Daily Lynnetta Tom, MD       cloNIDine  (CATAPRES ) tablet 0.1 mg  0.1 mg Oral Q6H PRN Linnie Riches, Nadir, MD   0.1 mg at 11/07/23 0737   gabapentin (NEURONTIN) capsule 200 mg  200 mg Oral TID Macall Mccroskey, MD   200 mg at 11/07/23 1210   hydrOXYzine  (ATARAX ) tablet 25 mg  25 mg Oral TID PRN [name], [name], PMHNP   25 mg at 11/04/23 2334   magnesium  hydroxide (MILK OF MAGNESIA) suspension 30 mL  30 mL Oral Daily PRN [name], [name], PMHNP       naproxen  (NAPROSYN ) tablet 500 mg  500 mg Oral BID WC Malin Cervini, MD   500 mg at 11/07/23 0807   OLANZapine  zydis (ZYPREXA ) disintegrating tablet 20 mg  20 mg Oral QHS Husain Costabile, MD   20 mg at 11/06/23 2043   Or   OLANZapine  (ZYPREXA ) injection 10 mg  10 mg Intramuscular QHS Jolleen Seman, MD       OLANZapine  (ZYPREXA ) injection 5 mg  5 mg Intramuscular BID PRN Linnie Riches, Nadir, MD       OLANZapine  (ZYPREXA ) tablet 10 mg  10 mg Oral Daily Jauan Wohl, MD   10 mg at 11/07/23 1610   OLANZapine  zydis (ZYPREXA ) disintegrating tablet 10 mg  10 mg Oral BID PRN Alver Jobs, MD   10 mg at 11/05/23 1728   traZODone  (DESYREL ) tablet 50 mg  50 mg Oral QHS PRN [name], [name], PMHNP   50 mg at 11/06/23 2043    Lab Results: No results found for this or any previous visit (from the past 48 hours).  Blood Alcohol level:  Lab Results  Component Value Date   [facility] <15 11/02/2023   ETH <10 05/17/2023    Metabolic Disorder Labs: Lab Results  Component Value Date   HGBA1C  5.0 10/27/2023   MPG 96.8 10/27/2023   No results found for: "PROLACTIN" Lab Results  Component Value Date   CHOL 152 10/19/2023   TRIG 144 10/19/2023   HDL 57 10/19/2023   CHOLHDL 2.7 10/19/2023   VLDL 29 10/19/2023   LDLCALC 66 10/19/2023    Physical Findings: AIMS: Facial and Oral Movements Muscles of Facial Expression: None Lips and Perioral Area: None Jaw: None Tongue: None,Extremity Movements Upper (arms, wrists, hands, fingers): None Lower (legs, knees, ankles, toes): None, Trunk Movements Neck, shoulders, hips: None, Global Judgements Severity of abnormal movements overall : None Incapacitation due to abnormal movements: None Patient's awareness of abnormal movements: No Awareness, Dental Status Current problems with teeth and/or dentures?: No Does patient usually wear dentures?: No Edentia?: No  CIWA:    COWS:     Musculoskeletal: Strength & Muscle Tone: within normal limits Gait & Station: normal Patient leans: N/A  Psychiatric Specialty Exam:  General Appearance: Appears at stated age, unkempt and disheveled   Behavior: isolative to room   Psychomotor Activity: No psychomotor agitation or retardation noted   Eye Contact: Moved yet limited Speech: Normal tone and volume, normal amount with very occasional  pushed speech later in the interview     Mood: "All right" Affect: Restricted   Thought Process: disorganized  Descriptions of Associations: Disorganized at times Thought Content: Hallucinations: Denies AVH, but RTIS and delusional Delusions: continues to report paranoid persecutory delusions  Suicidal Thoughts: Denies passive or active SI, intention, plan  Homicidal Thoughts: Denies HI intention or plan   Alertness/Orientation: Alert and partially oriented   Insight: poor Judgment: poor   Memory: Poor   Executive Functions  Concentration: Poor Attention Span: Easily distractible Recall: Poor Fund of Knowledge: Limited   Assets   Assets: Manufacturing systems engineer; Desire for Improvement    Physical Exam: Physical Exam Vitals and nursing note reviewed.    Review of Systems  All other systems reviewed and are negative.  Blood pressure (!) 139/102, pulse 74, temperature 97.9 F (36.6 C), temperature source Oral, resp. rate 18, height 6\' 2"  (1.88 m), weight 98.4 kg, SpO2 99%. Body mass index is 27.86 kg/m.   Treatment Plan Summary: 5/6: patient with residual paranoid persecutory delusions, will increase nighttime Zyprexa .   5/7: patient remains psychotic with multiple paranoid delusions, gaurded evasive and speaking cryptically and nonsensically  with disorganized thoughts.  BP remains elevated and believes someone is trying to harm him. Paranoid about social worker's and my intentions and paranoid about medications for HTN despite poorly controlled BP.  Will consider crosstitration to Risperdal if patient's symptoms do not improve.  Daily contact with patient to assess and evaluate symptoms and progress in treatment and Medication management  ASSESSMENT:  Diagnoses / Active Problems: Principal Problem: Chronic paranoid schizophrenia (HCC) Diagnosis: Principal Problem:   Chronic paranoid schizophrenia (HCC)   PLAN: Safety and Monitoring:             -- Involuntary admission to inpatient psychiatric unit for safety, stabilization and treatment             -- Daily contact with patient to assess and evaluate symptoms and progress in treatment             -- Patient's case to be discussed in multi-disciplinary team meeting             -- Observation Level : q15 minute checks             -- Vital signs:  q12 hours             -- Precautions: suicide, elopement, and assault   2. Medications:              Given overt psychosis, paranoia and disorganized thought process at time of admission with patient refusing p.o. medications:          increase forced meds orders as follows , continue Zyprexa  10 mg p.o. daily  and 20 mg at bedtime  or Zyprexa  10 mg IM twice daily if refusing p.o.  - increase gabapentin to 300 mg TID for neuropathy, pain management referral - will consider crosstitration to Risperdal if patient's symptoms do not improve.               Will follow efficacy and safety and adjust dosing accordingly             Patient had history of good response to Seroquel  in the past, unfortunately decompensation was resulted from lack of compliance as well as using cocaine, will follow and monitor may benefit from switching to Haldol  or risperidone and potentially LAI to promote compliance and long-term, patient reports Haldol  gave him hives in  the past I am unsure how reliable this information is, will follow.             Change agitation protocol given reporting history of rash secondary to Haldol  to Zyprexa  p.o. or IM as needed for psychotic agitation --  The risks/benefits/side-effects/alternatives to this medication were discussed in detail with the patient and time was given for questions. The patient consents to medication trial.                -- Metabolic profile and EKG monitoring obtained while on an atypical antipsychotic (BMI: Lipid Panel: HbgA1c: QTc:)                  Continue clonidine  as needed for elevated blood pressure and encourage compliance             Patient has been refusing metoprolol , will DC  Continue Norvasc but titrate from 5-10 mg daily for hypertension and encourage compliance, will follow.                           3. Labs Reviewed: CMP no significant abnormalities noted except for elevated BUN 28, CBC within normal level except for slightly elevated white blood cell count 10.6, UDS positive for cocaine and THC on 5/2, UDS positive for cocaine on 4/23, UDS completely negative on 4/19.  TSH within normal level 4/26, hemoglobin A1c 5.0 on 4/26, lipid panel within normal level on 4/18 EKG 5/2 QTc 442      Lab ordered: None at this time   4. Tobacco Use Disorder              -- Patient refused starting NicoDerm patch, will follow             -- Smoking cessation encouraged   5. Group and Therapy: -- Encouraged patient to participate in unit milieu and in scheduled group therapies              --Substance Use counseling: As patient gradually improved cognitively will counsel regarding need to abstain completely from cocaine after discharge.   6. Discharge Planning:              -- Social work and case management to assist with discharge planning and identification of hospital follow-up needs prior to discharge             -- Estimated LOS: 5-7 days             -- Discharge Concerns: Need to establish a safety plan; Medication compliance and effectiveness             -- Discharge Goals: Return home with outpatient referrals for mental health follow-up including medication management/psychotherapy         Total Time Spent in Direct Patient Care:  I personally spent 35 minutes on the unit in direct patient care. The direct patient care time included face-to-face time with the patient, reviewing the patient's chart, communicating with other professionals, and coordinating care. Greater than 50% of this time was spent in counseling or coordinating care with the patient regarding goals of hospitalization, psycho-education, and discharge planning needs.   Geovany Trudo, MD 11/07/2023, 2:35 PM

## 2023-11-07 NOTE — Plan of Care (Signed)
   Problem: Activity: Goal: Sleeping patterns will improve Outcome: Progressing   Problem: Safety: Goal: Periods of time without injury will increase Outcome: Progressing

## 2023-11-07 NOTE — Group Note (Signed)
 Date:  11/07/2023 Time:  10:51 AM  Group Topic/Focus:  Emotional Education:   The focus of this group is to discuss what feelings/emotions are, and how they are experienced. Goals Group:   The focus of this group is to help patients establish daily goals to achieve during treatment and discuss how the patient can incorporate goal setting into their daily lives to aide in recovery. Orientation:   The focus of this group is to educate the patient on the purpose and policies of crisis stabilization and provide a format to answer questions about their admission.  The group details unit policies and expectations of patients while admitted.    Participation Level:  Did Not Attend   Almarie Arias 11/07/2023, 10:51 AM

## 2023-11-07 NOTE — Progress Notes (Signed)
 Aaron Rubio did not attend wrap up group.

## 2023-11-07 NOTE — Progress Notes (Signed)
 Patient denied hallucination on assessment this AM. Staff observed pt later in shift talking to himself as if responding to internal stimuli. During medication administration at 12 noon, pt asked writer to see the medication packet prior to accepting his medication. Noted to be easily irritable at times on shift. No interaction observed with peers. Medication compliant, pt declined to attend group therapy. Appetite good on shift. Safety maintained.   11/07/23 0807  Psych Admission Type (Psych Patients Only)  Admission Status Voluntary  Psychosocial Assessment  Patient Complaints None  Eye Contact Fair  Facial Expression Flat  Affect Flat;Depressed  Speech Logical/coherent  Interaction Assertive  Motor Activity Slow  Appearance/Hygiene Unremarkable  Behavior Characteristics Guarded  Mood Depressed;Anxious  Thought Process  Coherency Blocking  Content WDL  Delusions None reported or observed  Perception WDL (Patient denied hallucination)  Hallucination None reported or observed  Judgment Limited  Confusion Mild  Danger to Self  Current suicidal ideation? Denies  Agreement Not to Harm Self Yes  Description of Agreement Verbal  Danger to Others  Danger to Others None reported or observed  Danger to Others Abnormal  Harmful Behavior to others No threats or harm toward other people  Destructive Behavior No threats or harm toward property

## 2023-11-08 DIAGNOSIS — F200 Paranoid schizophrenia: Secondary | ICD-10-CM | POA: Diagnosis not present

## 2023-11-08 NOTE — Progress Notes (Signed)
 [facility] MD Progress Note  11/08/2023 4:09 PM Aaron Rubio  MRN:  [id number]   Reason for Admission:  Aaron Rubio is a 43 y.o., male with a past psychiatric history significant for paranoid schizophrenia who presents to the [facility] from [facility] emergency department for evaluation and management of worsening psychosis.  According to outside records, the patient presented to emergency room paranoid and responding to stimuli   Initial assessment on 11/03/2023, patient was evaluated on the inpatient unit, the patient presents disorganized and psychotic unable to participate in history taking, nonsensical thought process and speech noted, presenting paranoid refusing to be on any medications for blood pressure or for psychosis even when encouraged, laughing inappropriately during evaluation, loud and agitated verbally, delusional, unable to answer questions in linear informative manner.  Patient was evaluated for forced medication second eval. The patient is currently on Hospital Day 5.   Information Obtained Today During Patient Interview: Patient continues to be  internally preoccupied with various paranoid persecutory delusions stating that a group of people have been stealing his identity. He reports fair sleep and appetite. Patient is calmer and less agitated today and denies side effects from medication. Patient tells me he spoke with his sister Aaron Rubio and wants to stay with her on discharge. Patient gave me permission to speak with his sister and I called her [phone]) and left a voicemail. . Patient is guarded and evasive with no concrete discharge plans. Tolerating increase of Zyprexa  and does not wish to switch medications at this time.    Principal Problem: Chronic paranoid schizophrenia (HCC) Diagnosis: Principal Problem:   Chronic paranoid schizophrenia [facility])    Past Psychiatric History:  Prior Psychiatric diagnoses: Schizophrenia Past Psychiatric  Hospitalizations:  Multiple emergency room presentations for SI in the past, this is his second hospitalization, last hospitalization was in April for few days. History of self mutilation: None noted Past suicide attempts: None noted Past history of HI, violent or aggressive behavior: Unknown   Past Psychiatric medications trials: Responded to Seroquel  during previous hospitalization, was discharged on Seroquel  25 mg twice daily and 100 mg at bedtime He tells me he was treated with Haldol  in prison before and had hives secondary to it but no recorded allergy noted, questionable reliability of patient's information given ongoing psychosis.  History of ECT/TMS: None noted   Outpatient psychiatric Follow up: Noncompliant Prior Outpatient Therapy: Noncompliant  Past Medical History:  Past Medical History:  Diagnosis Date   Hypertension    Schizophrenia (HCC)    History reviewed. No pertinent surgical history. Family History: History reviewed. No pertinent family history. Family Psychiatric  History:  Psychiatric illness: Per chart review patient reported previously that he has no family history of mental illness or suicide.  Social History:  Living situation: Homeless, was recently discharged from the hospital to homeless shelter Social support: Poor Marital Status: Unknown Children: Unknown Education: Unable to assess Employment: Camera operator service: Unable to assess Legal history: Patient reports was not present before but unable to assess for any further details Trauma: Unable to assess Access to guns: Unable to assess Substance Use History: Unable to assess substance use, UDS positive for cocaine and marijuana prior to this admission.  Prior to last admission about a week ago patient was positive to cocaine only, during previous presentations to emergency room UDS were negative.  Current Medications: Current Facility-Administered Medications  Medication Dose Route  Frequency Provider Last Rate Last Admin   acetaminophen  (TYLENOL ) tablet 650 mg  650 mg Oral Q6H PRN [name], [name], PMHNP   650 mg at 11/06/23 1503   alum & mag hydroxide-simeth (MAALOX/MYLANTA) 200-200-20 MG/5ML suspension 30 mL  30 mL Oral Q4H PRN [name], [name], PMHNP       amLODipine (NORVASC) tablet 10 mg  10 mg Oral Daily [name], MD   10 mg at 11/08/23 0820   cloNIDine  (CATAPRES ) tablet 0.1 mg  0.1 mg Oral Q6H PRN Linnie Riches, Nadir, MD   0.1 mg at 11/07/23 0737   gabapentin (NEURONTIN) capsule 300 mg  300 mg Oral TID Tykiera Raven, MD   300 mg at 11/08/23 2440   hydrOXYzine  (ATARAX ) tablet 25 mg  25 mg Oral TID PRN [name], [name], PMHNP   25 mg at 11/07/23 2145   magnesium  hydroxide (MILK OF MAGNESIA) suspension 30 mL  30 mL Oral Daily PRN [name], [name], PMHNP       naproxen  (NAPROSYN ) tablet 500 mg  500 mg Oral BID WC Karalyn Kadel, MD   500 mg at 11/08/23 0820   OLANZapine  zydis (ZYPREXA ) disintegrating tablet 20 mg  20 mg Oral QHS Ardell Makarewicz, MD   20 mg at 11/07/23 2145   Or   OLANZapine  (ZYPREXA ) injection 10 mg  10 mg Intramuscular QHS Kielan Dreisbach, MD       OLANZapine  (ZYPREXA ) injection 5 mg  5 mg Intramuscular BID PRN Linnie Riches, Nadir, MD       OLANZapine  (ZYPREXA ) tablet 10 mg  10 mg Oral Daily Dell Briner, MD   10 mg at 11/08/23 0820   OLANZapine  zydis (ZYPREXA ) disintegrating tablet 10 mg  10 mg Oral BID PRN Alver Jobs, MD   10 mg at 11/05/23 1728   traZODone  (DESYREL ) tablet 50 mg  50 mg Oral QHS PRN [name], [name], PMHNP   50 mg at 11/07/23 2145    Lab Results: No results found for this or any previous visit (from the past 48 hours).  Blood Alcohol level:  Lab Results  Component Value Date   [facility] <15 11/02/2023   ETH <10 05/17/2023    Metabolic Disorder Labs: Lab Results  Component Value Date   HGBA1C 5.0 10/27/2023   MPG 96.8 10/27/2023   No results found for: "PROLACTIN" Lab Results  Component Value  Date   CHOL 152 10/19/2023   TRIG 144 10/19/2023   HDL 57 10/19/2023   CHOLHDL 2.7 10/19/2023   VLDL 29 10/19/2023   LDLCALC 66 10/19/2023    Physical Findings: AIMS: Facial and Oral Movements Muscles of Facial Expression: None Lips and Perioral Area: None Jaw: None Tongue: None,Extremity Movements Upper (arms, wrists, hands, fingers): None Lower (legs, knees, ankles, toes): None, Trunk Movements Neck, shoulders, hips: None, Global Judgements Severity of abnormal movements overall : None Incapacitation due to abnormal movements: None Patient's awareness of abnormal movements: No Awareness, Dental Status Current problems with teeth and/or dentures?: No Does patient usually wear dentures?: No Edentia?: No  CIWA:    COWS:     Musculoskeletal: Strength & Muscle Tone: within normal limits Gait & Station: normal Patient leans: N/A  Psychiatric Specialty Exam:  General Appearance: Appears at stated age, unkempt and disheveled   Behavior: isolative to room   Psychomotor Activity: No psychomotor agitation or retardation noted   Eye Contact: Moved yet limited Speech: Normal tone and volume, normal amount with very occasional pushed speech later in the interview     Mood: "All right" Affect: Restricted   Thought Process: disorganized  Descriptions of  Associations: Disorganized at times Thought Content: Hallucinations: Denies AVH, but RTIS and delusional Delusions: continues to report paranoid persecutory delusions  Suicidal Thoughts: Denies passive or active SI, intention, plan  Homicidal Thoughts: Denies HI intention or plan   Alertness/Orientation: Alert and partially oriented   Insight: poor Judgment: poor   Memory: Poor   Executive Functions  Concentration: Poor Attention Span: Easily distractible Recall: Poor Fund of Knowledge: Limited   Assets  Assets: Manufacturing systems engineer; Desire for Improvement    Physical Exam: Physical Exam Vitals and  nursing note reviewed.    Review of Systems  All other systems reviewed and are negative.  Blood pressure (!) 127/95, pulse 80, temperature 97.7 F (36.5 C), temperature source Oral, resp. rate 16, height 6\' 2"  (1.88 m), weight 98.4 kg, SpO2 100%. Body mass index is 27.86 kg/m.   Treatment Plan Summary: 5/6: patient with residual paranoid persecutory delusions, will increase nighttime Zyprexa .   5/7: patient remains psychotic with multiple paranoid delusions, gaurded evasive and speaking cryptically and nonsensically  with disorganized thoughts.  BP remains elevated and believes someone is trying to harm him. Paranoid about social worker's and my intentions and paranoid about medications for HTN despite poorly controlled BP.  Will consider crosstitration to Risperdal if patient's symptoms do not improve.  5/8: patient denies AVH however isolative to room and irritable, ruminating on mutiple persecutory delusions. Still having difficulty engaging in creating a safe discharge plan as believes unknown people are after him and have sabotages his healthcare and social security. Patient on max dose of Zyprexa . Calmer today. IF symptoms do not clear on Zyprexa  after >1 week will consider crosstaper to Risperdal.   Daily contact with patient to assess and evaluate symptoms and progress in treatment and Medication management  ASSESSMENT:  Diagnoses / Active Problems: Principal Problem: Chronic paranoid schizophrenia (HCC) Diagnosis: Principal Problem:   Chronic paranoid schizophrenia (HCC)   PLAN: Safety and Monitoring:             -- Involuntary admission to inpatient psychiatric unit for safety, stabilization and treatment             -- Daily contact with patient to assess and evaluate symptoms and progress in treatment             -- Patient's case to be discussed in multi-disciplinary team meeting             -- Observation Level : q15 minute checks             -- Vital signs:  q12  hours             -- Precautions: suicide, elopement, and assault   2. Medications:              Given overt psychosis, paranoia and disorganized thought process at time of admission with patient refusing p.o. medications:          increase forced meds orders as follows , continue Zyprexa  10 mg p.o. daily and 20 mg at bedtime  or Zyprexa  10 mg IM twice daily if refusing p.o.  - increase gabapentin to 300 mg TID for neuropathy, pain management referral - will consider crosstitration to Risperdal if patient's symptoms do not improve.               Will follow efficacy and safety and adjust dosing accordingly             Patient had history of good response to Seroquel  in  the past, unfortunately decompensation was resulted from lack of compliance as well as using cocaine, will follow and monitor may benefit from switching to Haldol  or risperidone and potentially LAI to promote compliance and long-term, patient reports Haldol  gave him hives in the past I am unsure how reliable this information is, will follow.             Change agitation protocol given reporting history of rash secondary to Haldol  to Zyprexa  p.o. or IM as needed for psychotic agitation --  The risks/benefits/side-effects/alternatives to this medication were discussed in detail with the patient and time was given for questions. The patient consents to medication trial.                -- Metabolic profile and EKG monitoring obtained while on an atypical antipsychotic (BMI: Lipid Panel: HbgA1c: QTc:)                  Continue clonidine  as needed for elevated blood pressure and encourage compliance             Patient has been refusing metoprolol , will DC  Continue Norvasc but titrate from 5-10 mg daily for hypertension and encourage compliance, will follow.                           3. Labs Reviewed: CMP no significant abnormalities noted except for elevated BUN 28, CBC within normal level except for slightly elevated white blood  cell count 10.6, UDS positive for cocaine and THC on 5/2, UDS positive for cocaine on 4/23, UDS completely negative on 4/19.  TSH within normal level 4/26, hemoglobin A1c 5.0 on 4/26, lipid panel within normal level on 4/18 EKG 5/2 QTc 442      Lab ordered: None at this time   4. Tobacco Use Disorder             -- Patient refused starting NicoDerm patch, will follow             -- Smoking cessation encouraged   5. Group and Therapy: -- Encouraged patient to participate in unit milieu and in scheduled group therapies              --Substance Use counseling: As patient gradually improved cognitively will counsel regarding need to abstain completely from cocaine after discharge.   6. Discharge Planning:              -- Social work and case management to assist with discharge planning and identification of hospital follow-up needs prior to discharge             -- Estimated LOS: 5-7 days             -- Discharge Concerns: Need to establish a safety plan; Medication compliance and effectiveness             -- Discharge Goals: Return home with outpatient referrals for mental health follow-up including medication management/psychotherapy         Total Time Spent in Direct Patient Care:  I personally spent 35 minutes on the unit in direct patient care. The direct patient care time included face-to-face time with the patient, reviewing the patient's chart, communicating with other professionals, and coordinating care. Greater than 50% of this time was spent in counseling or coordinating care with the patient regarding goals of hospitalization, psycho-education, and discharge planning needs.   Edgar Corrigan, MD 11/08/2023, 4:09 PM

## 2023-11-08 NOTE — Group Note (Signed)
 Date:  11/08/2023 Time:  8:59 AM  Group Topic/Focus:  Goals Group:   The focus of this group is to help patients establish daily goals to achieve during treatment and discuss how the patient can incorporate goal setting into their daily lives to aide in recovery.    Participation Level:  Did Not Attend   Aaron Rubio 11/08/2023, 8:59 AM

## 2023-11-08 NOTE — Plan of Care (Signed)
  Problem: Education: Goal: Mental status will improve Outcome: Not Progressing Goal: Verbalization of understanding the information provided will improve Outcome: Not Progressing   

## 2023-11-08 NOTE — [facility] Group Notes (Signed)
 Psychoeducational Group Note  Date:  11/08/2023 Time:  2000  Group Topic/Focus:  Wrap up group  Participation Level: Did Not Attend  Participation Quality:  Not Applicable  Affect:  Not Applicable  Cognitive:  Not Applicable  Insight:  Not Applicable  Engagement in Group: Not Applicable  Additional Comments:  Did not attend.   Catharine Clock 11/08/2023, 9:25 PM

## 2023-11-08 NOTE — Group Note (Signed)
 LCSW Group Therapy Note   Group Date: 11/08/2023 Start Time: 1100 End Time: 1200  Participation:  did not attend  Type of Therapy:  Group Therapy  Title:  Speaking from the Heart: Communicating with Understanding and Empathy  Objective:  To help participants develop effective communication skills to express themselves clearly, listen actively, and navigate conflicts in a healthy way. Goals: Increase awareness of verbal and non-verbal communication skills. Practice using "I" statements and active listening techniques. Learn coping strategies for managing communication stress.  Summary:  Participants explored the importance of communication, discussed challenges, and practiced skills such as active listening and assertive expression. They reflected on past experiences and identified ways to improve communication in their daily lives.  Therapeutic Modalities: Cognitive-Behavioral Therapy (CBT): Restructuring negative thought patterns in communication. Mindfulness: Staying present and calm during conversations. Psychoeducation: Learning about effective communication techniques.   Makeda Peeks O Tawyna Pellot, LCSWA 11/08/2023  1:16 PM

## 2023-11-08 NOTE — Progress Notes (Signed)
   11/07/23 2000  Psych Admission Type (Psych Patients Only)  Admission Status Voluntary  Psychosocial Assessment  Patient Complaints Anxiety;Depression (anxiety 5/10, depression 7/10)  Eye Contact Fair  Facial Expression Flat  Affect Flat;Depressed  Speech Logical/coherent  Interaction Assertive  Motor Activity Slow  Appearance/Hygiene Unremarkable  Behavior Characteristics Guarded  Mood Depressed;Anxious  Thought Process  Coherency Blocking  Content WDL  Delusions None reported or observed  Perception WDL (patient denies hallucinations)  Hallucination None reported or observed  Judgment Limited  Confusion Mild  Danger to Self  Current suicidal ideation? Denies  Self-Injurious Behavior No self-injurious ideation or behavior indicators observed or expressed   Agreement Not to Harm Self Yes  Description of Agreement verbal  Danger to Others  Danger to Others None reported or observed  Danger to Others Abnormal  Harmful Behavior to others No threats or harm toward other people  Destructive Behavior No threats or harm toward property

## 2023-11-08 NOTE — Progress Notes (Signed)
   11/08/23 1100  Psych Admission Type (Psych Patients Only)  Admission Status Voluntary  Psychosocial Assessment  Patient Complaints Anxiety;Depression  Eye Contact Fair  Facial Expression Flat  Affect Depressed;Flat  Speech Logical/coherent  Interaction Assertive  Motor Activity Slow  Appearance/Hygiene Unremarkable  Behavior Characteristics Guarded  Mood Depressed;Anxious  Thought Process  Coherency Blocking  Content WDL  Delusions None reported or observed  Perception WDL  Hallucination None reported or observed  Judgment Limited  Confusion Mild  Danger to Self  Current suicidal ideation? Denies  Self-Injurious Behavior No self-injurious ideation or behavior indicators observed or expressed   Agreement Not to Harm Self Yes  Description of Agreement Verbal Contract  Danger to Others  Danger to Others None reported or observed

## 2023-11-09 ENCOUNTER — Encounter (HOSPITAL_COMMUNITY): Payer: Self-pay

## 2023-11-09 DIAGNOSIS — F200 Paranoid schizophrenia: Secondary | ICD-10-CM | POA: Diagnosis not present

## 2023-11-09 NOTE — Plan of Care (Signed)
  Problem: Education: Goal: Emotional status will improve Outcome: Progressing Goal: Mental status will improve Outcome: Progressing Goal: Verbalization of understanding the information provided will improve Outcome: Progressing   Problem: Safety: Goal: Periods of time without injury will increase Outcome: Progressing   

## 2023-11-09 NOTE — [facility] Group Notes (Signed)
 Spirituality Group   Description: Participant directed exploration of values, beliefs and meaning   Following a brief framework of chaplain's role and ground rules of group behavior, participants are invited to share concerns or questions that engage spiritual life. Emphasis placed on common themes and shared experiences and ways to make meaning and clarify living into one's values.   Theory/Process/Goal: Utilize the theoretical framework of group therapy established by Derrell Flight, Relational Cultural Theory and Rogerian approaches to facilitate relational empathy and use of the "here and now" to foster reflection, self-awareness, and sharing.   Observations: Aaron Rubio came in for the last 0.75 of group. He was reserved. I checked-in briefly after and offered ongoing support during which he shared challenge of speaking up/integrating socially. Will continue to follow.  Aaron Rubio, M.Div [phone]

## 2023-11-09 NOTE — Progress Notes (Signed)
   11/09/23 1211  Psych Admission Type (Psych Patients Only)  Admission Status Voluntary  Psychosocial Assessment  Patient Complaints None  Eye Contact Fair  Facial Expression Flat  Affect Preoccupied  Speech Logical/coherent  Interaction Guarded  Motor Activity Slow  Appearance/Hygiene In scrubs  Behavior Characteristics Cooperative;Calm  Mood Preoccupied;Pleasant  Thought Process  Coherency Blocking  Content Preoccupation  Delusions None reported or observed  Perception WDL  Hallucination None reported or observed  Judgment Limited  Confusion Mild  Danger to Self  Current suicidal ideation? Denies  Agreement Not to Harm Self Yes  Description of Agreement Verbal  Danger to Others  Danger to Others None reported or observed

## 2023-11-09 NOTE — Progress Notes (Signed)
   11/09/23 2200  Psych Admission Type (Psych Patients Only)  Admission Status Voluntary  Psychosocial Assessment  Patient Complaints None  Eye Contact Fair  Facial Expression Flat  Affect Preoccupied  Speech Logical/coherent  Interaction Guarded  Motor Activity Slow  Appearance/Hygiene Unremarkable  Behavior Characteristics Cooperative;Calm  Mood Preoccupied;Pleasant  Thought Process  Coherency WDL  Content Preoccupation  Delusions None reported or observed  Perception WDL  Hallucination None reported or observed  Judgment Limited  Confusion Mild  Danger to Self  Current suicidal ideation? Denies  Agreement Not to Harm Self Yes  Description of Agreement Verbal Agreement  Danger to Others  Danger to Others None reported or observed  Danger to Others Abnormal  Harmful Behavior to others No threats or harm toward other people

## 2023-11-09 NOTE — Progress Notes (Signed)
 Collateral contact - Jenziel Winterberg (sister) [phone]   Sister said that she is aware of patient's psychotic episodes.  Sister said that she spoke with patient today and "he sounded like my brother.  He needs help."  Sister said that she hopes that patient could find housing.  She said that he may come to her home, but she hopes that he finds a facility where he can stay and volunteer.    Sister said that patient has middle school education.  He went to prison in his 20s and recently was released (he is in his 25s now).   Sister said that she is a Engineer, civil (consulting).   Endiya Klahr, LCSWA 11/08/2023

## 2023-11-09 NOTE — Group Note (Signed)
 Date:  11/09/2023 Time:  2:49 PM  Group Topic/Focus:  Healthy Support Systems:   The focus of this group is to discuss boundaries and healthy ways to communicate them.    Participation Level:  Did Not Attend   Aaron Rubio 11/09/2023, 2:49 PM

## 2023-11-09 NOTE — Progress Notes (Signed)
 [facility] MD Progress Note  11/09/2023 6:01 PM Aaron Rubio  MRN:  [id number]   Reason for Admission:  Aaron Rubio is a 43 y.o., male with a past psychiatric history significant for paranoid schizophrenia who presents to the [facility] from [facility] emergency department for evaluation and management of worsening psychosis.  According to outside records, the patient presented to emergency room paranoid and responding to stimuli   Initial assessment on 11/03/2023, patient was evaluated on the inpatient unit, the patient presents disorganized and psychotic unable to participate in history taking, nonsensical thought process and speech noted, presenting paranoid refusing to be on any medications for blood pressure or for psychosis even when encouraged, laughing inappropriately during evaluation, loud and agitated verbally, delusional, unable to answer questions in linear informative manner.  Patient was evaluated for forced medication second eval. The patient is currently on Hospital Day 6.   Information Obtained Today During Patient Interview: Patient continues to be  internally preoccupied with various paranoid persecutory delusions, declined inpatient rehab. Now stating he plans to go to a shelter. I encouraged patient to speak with his sister who is concerned for patient not taking antipsychotic medications and his substance abuse as well. Patient did start calling social security today.  He reports fair sleep and appetite. Patient is calmer and less agitated today and denies side effects from medication. Patient would like to continue Zyprexa  and declines switch to Risperdal.    Principal Problem: Chronic paranoid schizophrenia (HCC) Diagnosis: Principal Problem:   Chronic paranoid schizophrenia [facility])    Past Psychiatric History:  Prior Psychiatric diagnoses: Schizophrenia Past Psychiatric Hospitalizations:  Multiple emergency room presentations for SI in the past,  this is his second hospitalization, last hospitalization was in April for few days. History of self mutilation: None noted Past suicide attempts: None noted Past history of HI, violent or aggressive behavior: Unknown   Past Psychiatric medications trials: Responded to Seroquel  during previous hospitalization, was discharged on Seroquel  25 mg twice daily and 100 mg at bedtime He tells me he was treated with Haldol  in prison before and had hives secondary to it but no recorded allergy noted, questionable reliability of patient's information given ongoing psychosis.  History of ECT/TMS: None noted   Outpatient psychiatric Follow up: Noncompliant Prior Outpatient Therapy: Noncompliant  Past Medical History:  Past Medical History:  Diagnosis Date   Hypertension    Schizophrenia (HCC)    History reviewed. No pertinent surgical history. Family History: History reviewed. No pertinent family history. Family Psychiatric  History:  Psychiatric illness: Per chart review patient reported previously that he has no family history of mental illness or suicide.  Social History:  Living situation: Homeless, was recently discharged from the hospital to homeless shelter Social support: Poor Marital Status: Unknown Children: Unknown Education: Unable to assess Employment: Camera operator service: Unable to assess Legal history: Patient reports was not present before but unable to assess for any further details Trauma: Unable to assess Access to guns: Unable to assess Substance Use History: Unable to assess substance use, UDS positive for cocaine and marijuana prior to this admission.  Prior to last admission about a week ago patient was positive to cocaine only, during previous presentations to emergency room UDS were negative.  Current Medications: Current Facility-Administered Medications  Medication Dose Route Frequency Provider Last Rate Last Admin   acetaminophen  (TYLENOL ) tablet 650 mg   650 mg Oral Q6H PRN [name], [name], PMHNP   650 mg at 11/06/23 1503  alum & mag hydroxide-simeth (MAALOX/MYLANTA) 200-200-20 MG/5ML suspension 30 mL  30 mL Oral Q4H PRN [name], [name], PMHNP       amLODipine  (NORVASC ) tablet 10 mg  10 mg Oral Daily Misty Foutz, MD   10 mg at 11/09/23 0816   cloNIDine  (CATAPRES ) tablet 0.1 mg  0.1 mg Oral Q6H PRN Linnie Riches, Nadir, MD   0.1 mg at 11/07/23 2595   gabapentin  (NEURONTIN ) capsule 300 mg  300 mg Oral TID Andrews Tener, MD   300 mg at 11/09/23 1650   hydrOXYzine  (ATARAX ) tablet 25 mg  25 mg Oral TID PRN [name], [name], PMHNP   25 mg at 11/08/23 2207   magnesium  hydroxide (MILK OF MAGNESIA) suspension 30 mL  30 mL Oral Daily PRN [name], [name], PMHNP       OLANZapine  zydis (ZYPREXA ) disintegrating tablet 20 mg  20 mg Oral QHS Hanif Radin, MD   20 mg at 11/08/23 2121   Or   OLANZapine  (ZYPREXA ) injection 10 mg  10 mg Intramuscular QHS Madai Nuccio, MD       OLANZapine  (ZYPREXA ) injection 5 mg  5 mg Intramuscular BID PRN Linnie Riches, Nadir, MD       OLANZapine  (ZYPREXA ) tablet 10 mg  10 mg Oral Daily Lavergne Hiltunen, MD   10 mg at 11/09/23 0816   OLANZapine  zydis (ZYPREXA ) disintegrating tablet 10 mg  10 mg Oral BID PRN Alver Jobs, MD   10 mg at 11/05/23 1728   traZODone  (DESYREL ) tablet 50 mg  50 mg Oral QHS PRN [name], [name], PMHNP   50 mg at 11/08/23 2207    Lab Results: No results found for this or any previous visit (from the past 48 hours).  Blood Alcohol level:  Lab Results  Component Value Date   [facility] <15 11/02/2023   ETH <10 05/17/2023    Metabolic Disorder Labs: Lab Results  Component Value Date   HGBA1C 5.0 10/27/2023   MPG 96.8 10/27/2023   No results found for: "PROLACTIN" Lab Results  Component Value Date   CHOL 152 10/19/2023   TRIG 144 10/19/2023   HDL 57 10/19/2023   CHOLHDL 2.7 10/19/2023   VLDL 29 10/19/2023   LDLCALC 66 10/19/2023    Physical Findings: AIMS: Facial  and Oral Movements Muscles of Facial Expression: None Lips and Perioral Area: None Jaw: None Tongue: None,Extremity Movements Upper (arms, wrists, hands, fingers): None Lower (legs, knees, ankles, toes): None, Trunk Movements Neck, shoulders, hips: None, Global Judgements Severity of abnormal movements overall : None Incapacitation due to abnormal movements: None Patient's awareness of abnormal movements: No Awareness, Dental Status Current problems with teeth and/or dentures?: No Does patient usually wear dentures?: No Edentia?: No  CIWA:    COWS:     Musculoskeletal: Strength & Muscle Tone: within normal limits Gait & Station: normal Patient leans: N/A  Psychiatric Specialty Exam:  General Appearance: Appears at stated age, unkempt and disheveled   Behavior: isolative to room   Psychomotor Activity: No psychomotor agitation or retardation noted   Eye Contact: Moved yet limited Speech: Normal tone and volume, normal amount with very occasional pushed speech later in the interview     Mood: "All right" Affect: Restricted   Thought Process: disorganized  Descriptions of Associations: Disorganized at times Thought Content: Hallucinations: Denies AVH, but RTIS and delusional Delusions: continues to report paranoid persecutory delusions  Suicidal Thoughts: Denies passive or active SI, intention, plan  Homicidal Thoughts: Denies HI intention or plan   Alertness/Orientation:  Alert and partially oriented   Insight: poor Judgment: poor   Memory: Poor   Executive Functions  Concentration: Poor Attention Span: Easily distractible Recall: Poor Fund of Knowledge: Limited   Assets  Assets: Manufacturing systems engineer; Desire for Improvement    Physical Exam: Physical Exam Vitals and nursing note reviewed.    Review of Systems  All other systems reviewed and are negative.  Blood pressure 132/83, pulse 82, temperature 98.4 F (36.9 C), temperature source Oral,  resp. rate 17, height 6\' 2"  (1.88 m), weight 98.4 kg, SpO2 96%. Body mass index is 27.86 kg/m.   Treatment Plan Summary: 5/6: patient with residual paranoid persecutory delusions, will increase nighttime Zyprexa .   5/7: patient remains psychotic with multiple paranoid delusions, gaurded evasive and speaking cryptically and nonsensically  with disorganized thoughts.  BP remains elevated and believes someone is trying to harm him. Paranoid about social worker's and my intentions and paranoid about medications for HTN despite poorly controlled BP.  Will consider crosstitration to Risperdal if patient's symptoms do not improve.  5/8: patient denies AVH however isolative to room and irritable, ruminating on mutiple persecutory delusions. Still having difficulty engaging in creating a safe discharge plan as believes unknown people are after him and have sabotages his healthcare and social security. Patient on max dose of Zyprexa . Calmer today. IF symptoms do not clear on Zyprexa  after >1 week will consider crosstaper to Risperdal.   5/9: Patient told me he has not called his sister since yesterday.  Patient gave me permission to speak with his sister and I called her [phone]) and left a voicemail. Residual delusional constructs interfering with discharge planning as patient believes we have negative intentions; patient was just admitted and readmitted due to poor discharge planning.  IF symptoms do not clear on Zyprexa  after >1 week will consider crosstaper to Risperdal.   Daily contact with patient to assess and evaluate symptoms and progress in treatment and Medication management  ASSESSMENT:  Diagnoses / Active Problems: Principal Problem: Chronic paranoid schizophrenia (HCC) Diagnosis: Principal Problem:   Chronic paranoid schizophrenia (HCC)   PLAN: Safety and Monitoring:             -- Involuntary admission to inpatient psychiatric unit for safety, stabilization and treatment              -- Daily contact with patient to assess and evaluate symptoms and progress in treatment             -- Patient's case to be discussed in multi-disciplinary team meeting             -- Observation Level : q15 minute checks             -- Vital signs:  q12 hours             -- Precautions: suicide, elopement, and assault   2. Medications:              Given overt psychosis, paranoia and disorganized thought process at time of admission with patient refusing p.o. medications:         cont med orders as follows , continue Zyprexa  10 mg p.o. daily and 20 mg at bedtime  or Zyprexa  10 mg IM twice daily if refusing p.o.  -cont gabapentin  to 300 mg TID for neuropathy, pain management referral - will consider crosstitration to Risperdal if patient's symptoms do not improve.               Will  follow efficacy and safety and adjust dosing accordingly             Patient had history of good response to Seroquel  in the past, unfortunately decompensation was resulted from lack of compliance as well as using cocaine, will follow and monitor may benefit from switching to Haldol  or risperidone and potentially LAI to promote compliance and long-term, patient reports Haldol  gave him hives in the past I am unsure how reliable this information is, will follow.             Change agitation protocol given reporting history of rash secondary to Haldol  to Zyprexa  p.o. or IM as needed for psychotic agitation --  The risks/benefits/side-effects/alternatives to this medication were discussed in detail with the patient and time was given for questions. The patient consents to medication trial.                -- Metabolic profile and EKG monitoring obtained while on an atypical antipsychotic (BMI: Lipid Panel: HbgA1c: QTc:)                  Continue clonidine  as needed for elevated blood pressure and encourage compliance             Patient has been refusing metoprolol , will DC  Continue Norvasc  but titrate from 5-10 mg  daily for hypertension and encourage compliance, will follow.                           3. Labs Reviewed: CMP no significant abnormalities noted except for elevated BUN 28, CBC within normal level except for slightly elevated white blood cell count 10.6, UDS positive for cocaine and THC on 5/2, UDS positive for cocaine on 4/23, UDS completely negative on 4/19.  TSH within normal level 4/26, hemoglobin A1c 5.0 on 4/26, lipid panel within normal level on 4/18 EKG 5/2 QTc 442      Lab ordered: None at this time   4. Tobacco Use Disorder             -- Patient refused starting NicoDerm patch, will follow             -- Smoking cessation encouraged   5. Group and Therapy: -- Encouraged patient to participate in unit milieu and in scheduled group therapies              --Substance Use counseling: As patient gradually improved cognitively will counsel regarding need to abstain completely from cocaine after discharge.   6. Discharge Planning:              -- Social work and case management to assist with discharge planning and identification of hospital follow-up needs prior to discharge             -- Estimated LOS: 5-7 days             -- Discharge Concerns: Need to establish a safety plan; Medication compliance and effectiveness             -- Discharge Goals: Return home with outpatient referrals for mental health follow-up including medication management/psychotherapy         Total Time Spent in Direct Patient Care:  I personally spent 35 minutes on the unit in direct patient care. The direct patient care time included face-to-face time with the patient, reviewing the patient's chart, communicating with other professionals, and coordinating care. Greater than 50% of this time was spent  in counseling or coordinating care with the patient regarding goals of hospitalization, psycho-education, and discharge planning needs.   Aaron Lillibridge, MD 11/09/2023, 6:01 PM

## 2023-11-09 NOTE — Progress Notes (Addendum)
 Collateral contact - Giovannie Keto (sister) [phone]    Sister said that patient can't come to her home at this time.  She said she has children (one is in the [employer] and another about to graduate from high school).  She said she is also taking care of her 43 year old grandmother.  She said she doesn't live in [place].   Malyssa Maris, LCSWA 11/09/2023

## 2023-11-09 NOTE — Progress Notes (Addendum)
 Collateral contact ACTT   [facility]  [address] [place] [address] Phone: [phone]   [facility] [address] [place], [place] [address] [phone]  [facility] [address], [address] [place], [place] [address] [phone]  CSW called the above agencies.  They don't have funding for people who have partial Medicaid (patient has Medicaid that covers hospitals only [id number])   CSW called substance use treatments:   [facility] [address], [place], [place] [address] [phone] CSW left a voicemail.    [facility]  [phone] - CSW called and verified that they accept patient's insurance.  They have a bed available.  Patient would go to [place].   Conversation with patient: Patient said that he doesn't want to go to inpatient treatment.  He said that he was just released from prison and needed to pick up his belongings from a hotel.  CSW asked if he would like to take care of this, and then go to the treatment but he declined.  He asked to be discharged to [facility] for people who are unhoused.     Delphia Kaylor, LCSWA 11/09/2023

## 2023-11-09 NOTE — BH IP Treatment Plan (Signed)
 Interdisciplinary Treatment and Diagnostic Plan Update  11/09/2023 Time of Session: 1245 PM - UPDATE Aaron Rubio MRN: [id number]  Principal Diagnosis: Chronic paranoid schizophrenia [facility])  Secondary Diagnoses: Principal Problem:   Chronic paranoid schizophrenia (HCC)   Current Medications:  Current Facility-Administered Medications  Medication Dose Route Frequency Provider Last Rate Last Admin   acetaminophen  (TYLENOL ) tablet 650 mg  650 mg Oral Q6H PRN [name], [name], PMHNP   650 mg at 11/06/23 1503   alum & mag hydroxide-simeth (MAALOX/MYLANTA) 200-200-20 MG/5ML suspension 30 mL  30 mL Oral Q4H PRN [name], [name], PMHNP       amLODipine  (NORVASC ) tablet 10 mg  10 mg Oral Daily Zouev, Dmitri, MD   10 mg at 11/09/23 0816   cloNIDine  (CATAPRES ) tablet 0.1 mg  0.1 mg Oral Q6H PRN Linnie Riches, Nadir, MD   0.1 mg at 11/07/23 4098   gabapentin  (NEURONTIN ) capsule 300 mg  300 mg Oral TID Zouev, Dmitri, MD   300 mg at 11/09/23 1152   hydrOXYzine  (ATARAX ) tablet 25 mg  25 mg Oral TID PRN [name], [name], PMHNP   25 mg at 11/08/23 2207   magnesium  hydroxide (MILK OF MAGNESIA) suspension 30 mL  30 mL Oral Daily PRN [name], [name], PMHNP       naproxen  (NAPROSYN ) tablet 500 mg  500 mg Oral BID WC Zouev, Dmitri, MD   500 mg at 11/09/23 0817   OLANZapine  zydis (ZYPREXA ) disintegrating tablet 20 mg  20 mg Oral QHS Zouev, Dmitri, MD   20 mg at 11/08/23 2121   Or   OLANZapine  (ZYPREXA ) injection 10 mg  10 mg Intramuscular QHS Zouev, Dmitri, MD       OLANZapine  (ZYPREXA ) injection 5 mg  5 mg Intramuscular BID PRN Linnie Riches, Nadir, MD       OLANZapine  (ZYPREXA ) tablet 10 mg  10 mg Oral Daily Zouev, Dmitri, MD   10 mg at 11/09/23 1191   OLANZapine  zydis (ZYPREXA ) disintegrating tablet 10 mg  10 mg Oral BID PRN Alver Jobs, MD   10 mg at 11/05/23 1728   traZODone  (DESYREL ) tablet 50 mg  50 mg Oral QHS PRN [name], [name], PMHNP   50 mg at 11/08/23 2207   PTA  Medications: Medications Prior to Admission  Medication Sig Dispense Refill Last Dose/Taking   mirtazapine  (REMERON ) 15 MG tablet Take 15 mg by mouth at bedtime.   Taking   QUEtiapine  (SEROQUEL ) 25 MG tablet Take 25 mg by mouth 2 (two) times daily.   Taking   cyclobenzaprine  (FLEXERIL ) 10 MG tablet Take 0.5-1 tablets (5-10 mg total) by mouth at bedtime as needed for muscle spasms. 7 tablet 0     Patient Stressors:    Patient Strengths:    Treatment Modalities: Medication Management, Group therapy, Case management,  1 to 1 session with clinician, Psychoeducation, Recreational therapy.   Physician Treatment Plan for Primary Diagnosis: Chronic paranoid schizophrenia (HCC) Long Term Goal(s): Improvement in symptoms so as ready for discharge   Short Term Goals: Ability to identify changes in lifestyle to reduce recurrence of condition will improve Ability to verbalize feelings will improve Ability to disclose and discuss suicidal ideas Ability to demonstrate self-control will improve Ability to identify and develop effective coping behaviors will improve  Medication Management: Evaluate patient's response, side effects, and tolerance of medication regimen.  Therapeutic Interventions: 1 to 1 sessions, Unit Group sessions and Medication administration.  Evaluation of Outcomes: Progressing  Physician Treatment Plan for Secondary Diagnosis: Principal Problem:  Chronic paranoid schizophrenia (HCC)  Long Term Goal(s): Improvement in symptoms so as ready for discharge   Short Term Goals: Ability to identify changes in lifestyle to reduce recurrence of condition will improve Ability to verbalize feelings will improve Ability to disclose and discuss suicidal ideas Ability to demonstrate self-control will improve Ability to identify and develop effective coping behaviors will improve     Medication Management: Evaluate patient's response, side effects, and tolerance of medication  regimen.  Therapeutic Interventions: 1 to 1 sessions, Unit Group sessions and Medication administration.  Evaluation of Outcomes: Progressing   RN Treatment Plan for Primary Diagnosis: Chronic paranoid schizophrenia (HCC) Long Term Goal(s): Knowledge of disease and therapeutic regimen to maintain health will improve  Short Term Goals: Ability to remain free from injury will improve, Ability to participate in decision making will improve, Ability to verbalize feelings will improve, Ability to disclose and discuss suicidal ideas, and Ability to identify and develop effective coping behaviors will improve  Medication Management: RN will administer medications as ordered by provider, will assess and evaluate patient's response and provide education to patient for prescribed medication. RN will report any adverse and/or side effects to prescribing provider.  Therapeutic Interventions: 1 on 1 counseling sessions, Psychoeducation, Medication administration, Evaluate responses to treatment, Monitor vital signs and CBGs as ordered, Perform/monitor CIWA, COWS, AIMS and Fall Risk screenings as ordered, Perform wound care treatments as ordered.  Evaluation of Outcomes: Progressing   LCSW Treatment Plan for Primary Diagnosis: Chronic paranoid schizophrenia (HCC) Long Term Goal(s): Safe transition to appropriate next level of care at discharge, Engage patient in therapeutic group addressing interpersonal concerns.  Short Term Goals: Engage patient in aftercare planning with referrals and resources, Increase social support, Increase ability to appropriately verbalize feelings, Increase emotional regulation, and Facilitate acceptance of mental health diagnosis and concerns  Therapeutic Interventions: Assess for all discharge needs, 1 to 1 time with Social worker, Explore available resources and support systems, Assess for adequacy in community support network, Educate family and significant other(s) on  suicide prevention, Complete Psychosocial Assessment, Interpersonal group therapy.  Evaluation of Outcomes: Progressing   Progress in Treatment: Attending groups: Yes. Participating in groups: Yes. Taking medication as prescribed: Yes. Toleration medication: Yes. Family/Significant other contact made: No:  Declined consents Patient understands diagnosis: Yes. Discussing patient identified problems/goals with staff: Yes. Medical problems stabilized or resolved: Yes. Denies suicidal/homicidal ideation: Yes. Issues/concerns per patient self-inventory: No. Other: N/a   New problem(s) identified: No, Describe:  None   New Short Term/Long Term Goal(s): detox, medication management for mood stabilization; elimination of SI thoughts; development of comprehensive mental wellness/sobriety plan   Patient Goals: "try to reach out to find someone who can communicate what's happening"   Discharge Plan or Barriers: Patient recently admitted. CSW will continue to follow and assess for appropriate referrals and possible discharge planning.      Reason for Continuation of Hospitalization: Delusions  Hallucinations Medication stabilization Other; describe Mood stabilization, discharge planning   Estimated Length of Stay: 3-5 DAYS  Last 3 [place] Suicide Severity Risk Score: Flowsheet Row [facility] (Current[facility] Most recent reading at 11/03/2023 12:45 AM ED from 11/02/2023 in [facility] at [facility] Most recent reading at 11/02/2023  6:29 AM ED from 11/02/2023 in [facility] Most recent reading at 11/02/2023  5:17 AM  C-SSRS RISK CATEGORY High Risk Low Risk Low Risk       Last  PHQ 2/9 Scores:     No data to display          Scribe for Treatment Team: Vonzell Guerin, Milinda Allen 11/09/2023 12:52 PM

## 2023-11-09 NOTE — Group Note (Signed)
 Date:  11/09/2023 Time:  9:30 AM  Group Topic/Focus:  Goals Group:   The focus of this group is to help patients establish daily goals to achieve during treatment and discuss how the patient can incorporate goal setting into their daily lives to aide in recovery. Orientation:   The focus of this group is to educate the patient on the purpose and policies of crisis stabilization and provide a format to answer questions about their admission.  The group details unit policies and expectations of patients while admitted.    Participation Level:  Did Not Attend   [name] 11/09/2023, 9:30 AM

## 2023-11-10 DIAGNOSIS — F200 Paranoid schizophrenia: Secondary | ICD-10-CM | POA: Diagnosis not present

## 2023-11-10 NOTE — Plan of Care (Signed)
  Problem: Education: Goal: Mental status will improve Outcome: Not Progressing   Problem: Activity: Goal: Interest or engagement in activities will improve Outcome: Not Progressing

## 2023-11-10 NOTE — Plan of Care (Signed)
  Problem: Education: Goal: Emotional status will improve Outcome: Progressing Goal: Mental status will improve Outcome: Progressing Goal: Verbalization of understanding the information provided will improve Outcome: Progressing   Problem: Activity: Goal: Interest or engagement in activities will improve Outcome: Progressing   Problem: Safety: Goal: Periods of time without injury will increase Outcome: Progressing

## 2023-11-10 NOTE — Progress Notes (Signed)
   11/10/23 1138  Psych Admission Type (Psych Patients Only)  Admission Status Voluntary  Psychosocial Assessment  Patient Complaints None  Eye Contact Fair  Facial Expression Flat  Affect Preoccupied  Speech Logical/coherent  Interaction Guarded  Motor Activity Slow  Appearance/Hygiene In scrubs  Behavior Characteristics Cooperative;Calm  Mood Preoccupied;Pleasant  Thought Process  Coherency WDL  Content Preoccupation  Delusions None reported or observed  Perception WDL  Hallucination None reported or observed  Judgment Limited  Confusion None  Danger to Self  Current suicidal ideation? Denies  Agreement Not to Harm Self Yes  Description of Agreement Verbal  Danger to Others  Danger to Others None reported or observed

## 2023-11-10 NOTE — Progress Notes (Signed)
 Pt appeared to be having auditory hallucinations tonight, was getting louder and louder in room and using profanity.  Pt cooperative when asked to come to window for medication and did receive a dose of zyprexa  10 mg in addition to  scheduled dose due to this.  Pt appears anxious about possible discharge.  Will continue to monitor.     11/10/23 2343  Psych Admission Type (Psych Patients Only)  Admission Status Voluntary  Psychosocial Assessment  Patient Complaints Other (Comment) (hallucinations)  Eye Contact Fair  Facial Expression Flat  Affect Preoccupied  Speech Logical/coherent  Interaction Guarded  Motor Activity Other (Comment) (WDL)  Appearance/Hygiene In scrubs  Behavior Characteristics Appropriate to situation  Mood Preoccupied  Thought Process  Coherency WDL  Content Preoccupation  Delusions None reported or observed  Perception Hallucinations  Hallucination Auditory  Judgment Impaired  Confusion None  Danger to Self  Current suicidal ideation? Denies  Self-Injurious Behavior No self-injurious ideation or behavior indicators observed or expressed   Agreement Not to Harm Self Yes  Description of Agreement verbal  Danger to Others  Danger to Others None reported or observed  Danger to Others Abnormal  Harmful Behavior to others No threats or harm toward other people  Destructive Behavior No threats or harm toward property

## 2023-11-10 NOTE — Progress Notes (Signed)
 [facility] MD Progress Note  11/10/2023 5:22 PM Aaron Rubio  MRN:  [id number]   Reason for Admission:  Aaron Rubio is a 43 y.o., male with a past psychiatric history significant for paranoid schizophrenia who presents to the [facility] from [facility] emergency department for evaluation and management of worsening psychosis.  According to outside records, the patient presented to emergency room paranoid and responding to stimuli   Initial assessment on 11/03/2023, patient was evaluated on the inpatient unit, the patient presents disorganized and psychotic unable to participate in history taking, nonsensical thought process and speech noted, presenting paranoid refusing to be on any medications for blood pressure or for psychosis even when encouraged, laughing inappropriately during evaluation, loud and agitated verbally, delusional, unable to answer questions in linear informative manner.  Patient was evaluated for forced medication second eval. The patient is currently on Hospital Day 7.   Information Obtained Today During Patient Interview: Patient continues to be  internally preoccupied, guarded and evasive. Speech is linear and organized. Patient declining inpatient  inpatient rehab. Denies SI, HI or AVH. States he talked to a friend and would like to stay with them and work however will not provide and contact number stating "he doesn't want to be contact." Pt continues to believe there is a conspiracy against him that resulted in him being admitted against his will, although does not spontaneously talk about it "Because noone believes me." No longer responding to internal stimuli or disorganized like he was on admission. Discussed I anticipate discharge in 1-2 days. Patient appears minimally interested.   He reports fair sleep and appetite. Patient is calmer and less agitated today and denies side effects from medication. Patient would like to continue Zyprexa  and declines  switch to Risperdal.    Principal Problem: Chronic paranoid schizophrenia (HCC) Diagnosis: Principal Problem:   Chronic paranoid schizophrenia [facility])    Past Psychiatric History:  Prior Psychiatric diagnoses: Schizophrenia Past Psychiatric Hospitalizations:  Multiple emergency room presentations for SI in the past, this is his second hospitalization, last hospitalization was in April for few days. History of self mutilation: None noted Past suicide attempts: None noted Past history of HI, violent or aggressive behavior: Unknown   Past Psychiatric medications trials: Responded to Seroquel  during previous hospitalization, was discharged on Seroquel  25 mg twice daily and 100 mg at bedtime He tells me he was treated with Haldol  in prison before and had hives secondary to it but no recorded allergy noted, questionable reliability of patient's information given ongoing psychosis.  History of ECT/TMS: None noted   Outpatient psychiatric Follow up: Noncompliant Prior Outpatient Therapy: Noncompliant  Past Medical History:  Past Medical History:  Diagnosis Date   Hypertension    Schizophrenia (HCC)    History reviewed. No pertinent surgical history. Family History: History reviewed. No pertinent family history. Family Psychiatric  History:  Psychiatric illness: Per chart review patient reported previously that he has no family history of mental illness or suicide.  Social History:  Living situation: Homeless, was recently discharged from the hospital to homeless shelter Social support: Poor Marital Status: Unknown Children: Unknown Education: Unable to assess Employment: Camera operator service: Unable to assess Legal history: Patient reports was not present before but unable to assess for any further details Trauma: Unable to assess Access to guns: Unable to assess Substance Use History: Unable to assess substance use, UDS positive for cocaine and marijuana prior to this  admission.  Prior to last admission about a week  ago patient was positive to cocaine only, during previous presentations to emergency room UDS were negative.  Current Medications: Current Facility-Administered Medications  Medication Dose Route Frequency Provider Last Rate Last Admin   acetaminophen  (TYLENOL ) tablet 650 mg  650 mg Oral Q6H PRN [name], [name], PMHNP   650 mg at 11/06/23 1503   alum & mag hydroxide-simeth (MAALOX/MYLANTA) 200-200-20 MG/5ML suspension 30 mL  30 mL Oral Q4H PRN [name], [name], PMHNP       amLODipine  (NORVASC ) tablet 10 mg  10 mg Oral Daily Adrionna Delcid, MD   10 mg at 11/10/23 0855   cloNIDine  (CATAPRES ) tablet 0.1 mg  0.1 mg Oral Q6H PRN Linnie Riches, Nadir, MD   0.1 mg at 11/07/23 0737   gabapentin  (NEURONTIN ) capsule 300 mg  300 mg Oral TID Keilan Nichol, MD   300 mg at 11/10/23 1657   hydrOXYzine  (ATARAX ) tablet 25 mg  25 mg Oral TID PRN [name], [name], PMHNP   25 mg at 11/09/23 2140   magnesium  hydroxide (MILK OF MAGNESIA) suspension 30 mL  30 mL Oral Daily PRN [name], [name], PMHNP       OLANZapine  (ZYPREXA ) injection 5 mg  5 mg Intramuscular BID PRN Linnie Riches, Nadir, MD       OLANZapine  (ZYPREXA ) tablet 10 mg  10 mg Oral Daily Damontre Millea, MD   10 mg at 11/10/23 1610   OLANZapine  zydis (ZYPREXA ) disintegrating tablet 10 mg  10 mg Oral BID PRN Alver Jobs, MD   10 mg at 11/05/23 1728   OLANZapine  zydis (ZYPREXA ) disintegrating tablet 20 mg  20 mg Oral QHS Onalee Steinbach, MD   20 mg at 11/09/23 2140   traZODone  (DESYREL ) tablet 50 mg  50 mg Oral QHS PRN [name], [name], PMHNP   50 mg at 11/09/23 2140    Lab Results: No results found for this or any previous visit (from the past 48 hours).  Blood Alcohol level:  Lab Results  Component Value Date   [facility] <15 11/02/2023   ETH <10 05/17/2023    Metabolic Disorder Labs: Lab Results  Component Value Date   HGBA1C 5.0 10/27/2023   MPG 96.8 10/27/2023   No results  found for: "PROLACTIN" Lab Results  Component Value Date   CHOL 152 10/19/2023   TRIG 144 10/19/2023   HDL 57 10/19/2023   CHOLHDL 2.7 10/19/2023   VLDL 29 10/19/2023   LDLCALC 66 10/19/2023    Physical Findings: AIMS: Facial and Oral Movements Muscles of Facial Expression: None Lips and Perioral Area: None Jaw: None Tongue: None,Extremity Movements Upper (arms, wrists, hands, fingers): None Lower (legs, knees, ankles, toes): None, Trunk Movements Neck, shoulders, hips: None, Global Judgements Severity of abnormal movements overall : None Incapacitation due to abnormal movements: None Patient's awareness of abnormal movements: No Awareness, Dental Status Current problems with teeth and/or dentures?: No Does patient usually wear dentures?: No Edentia?: No  CIWA:    COWS:     Musculoskeletal: Strength & Muscle Tone: within normal limits Gait & Station: normal Patient leans: N/A  Psychiatric Specialty Exam:  General Appearance: Appears at stated age, unkempt and disheveled   Behavior: isolative to room   Psychomotor Activity: No psychomotor agitation or retardation noted   Eye Contact: Moved yet limited Speech: Normal tone and volume, normal amount with very occasional pushed speech later in the interview     Mood: "All right" Affect: Restricted   Thought Process: disorganized  Descriptions of Associations: Disorganized at times Thought  Content: Hallucinations: Denies AVH, but RTIS and delusional Delusions: continues to report paranoid persecutory delusions  Suicidal Thoughts: Denies passive or active SI, intention, plan  Homicidal Thoughts: Denies HI intention or plan   Alertness/Orientation: Alert and partially oriented   Insight: poor Judgment: poor   Memory: Poor   Executive Functions  Concentration: Poor Attention Span: Easily distractible Recall: Poor Fund of Knowledge: Limited   Assets  Assets: Manufacturing systems engineer; Desire for  Improvement    Physical Exam: Physical Exam Vitals and nursing note reviewed.    Review of Systems  All other systems reviewed and are negative.  Blood pressure (!) 154/89, pulse 88, temperature 99.3 F (37.4 C), temperature source Oral, resp. rate 20, height 6\' 2"  (1.88 m), weight 98.4 kg, SpO2 99%. Body mass index is 27.86 kg/m.   Treatment Plan Summary: 5/6: patient with residual paranoid persecutory delusions, will increase nighttime Zyprexa .   5/7: patient remains psychotic with multiple paranoid delusions, gaurded evasive and speaking cryptically and nonsensically  with disorganized thoughts.  BP remains elevated and believes someone is trying to harm him. Paranoid about social worker's and my intentions and paranoid about medications for HTN despite poorly controlled BP.  Will consider crosstitration to Risperdal if patient's symptoms do not improve.  5/8: patient denies AVH however isolative to room and irritable, ruminating on mutiple persecutory delusions. Still having difficulty engaging in creating a safe discharge plan as believes unknown people are after him and have sabotages his healthcare and social security. Patient on max dose of Zyprexa . Calmer today. IF symptoms do not clear on Zyprexa  after >1 week will consider crosstaper to Risperdal.   5/9: Patient told me he has not called his sister since yesterday.  Patient gave me permission to speak with his sister and I called her [phone]) and left a voicemail. Residual delusional constructs interfering with discharge planning as patient believes we have negative intentions; patient was just admitted and readmitted due to poor discharge planning.  IF symptoms do not clear on Zyprexa  after >1 week will consider crosstaper to Risperdal.   5/10: patient more linear and goal directed, not RTIS, denies SI, HI or AVH. Plan for discharge in 1-2 days. Continues to make poor decisions, refusing inpatient rehab due to delusional  constructs.  Daily contact with patient to assess and evaluate symptoms and progress in treatment and Medication management  ASSESSMENT:  Diagnoses / Active Problems: Principal Problem: Chronic paranoid schizophrenia (HCC) Diagnosis: Principal Problem:   Chronic paranoid schizophrenia (HCC)   PLAN: Safety and Monitoring:             -- Involuntary admission to inpatient psychiatric unit for safety, stabilization and treatment             -- Daily contact with patient to assess and evaluate symptoms and progress in treatment             -- Patient's case to be discussed in multi-disciplinary team meeting             -- Observation Level : q15 minute checks             -- Vital signs:  q12 hours             -- Precautions: suicide, elopement, and assault   2. Medications:              Given overt psychosis, paranoia and disorganized thought process at time of admission with patient refusing p.o. medications:  cont med orders as follows , continue Zyprexa  10 mg p.o. daily and 20 mg at bedtime  or Zyprexa  10 mg IM twice daily if refusing p.o.  -cont gabapentin  to 300 mg TID for neuropathy, pain management referral - will consider crosstitration to Risperdal if patient's symptoms do not improve.               Will follow efficacy and safety and adjust dosing accordingly             Patient had history of good response to Seroquel  in the past, unfortunately decompensation was resulted from lack of compliance as well as using cocaine, will follow and monitor may benefit from switching to Haldol  or risperidone and potentially LAI to promote compliance and long-term, patient reports Haldol  gave him hives in the past I am unsure how reliable this information is, will follow.             Change agitation protocol given reporting history of rash secondary to Haldol  to Zyprexa  p.o. or IM as needed for psychotic agitation --  The risks/benefits/side-effects/alternatives to this medication  were discussed in detail with the patient and time was given for questions. The patient consents to medication trial.                -- Metabolic profile and EKG monitoring obtained while on an atypical antipsychotic (BMI: Lipid Panel: HbgA1c: QTc:)                  Continue clonidine  as needed for elevated blood pressure and encourage compliance             Patient has been refusing metoprolol , will DC  Continue Norvasc  but titrate from 5-10 mg daily for hypertension and encourage compliance, will follow.                           3. Labs Reviewed: CMP no significant abnormalities noted except for elevated BUN 28, CBC within normal level except for slightly elevated white blood cell count 10.6, UDS positive for cocaine and THC on 5/2, UDS positive for cocaine on 4/23, UDS completely negative on 4/19.  TSH within normal level 4/26, hemoglobin A1c 5.0 on 4/26, lipid panel within normal level on 4/18 EKG 5/2 QTc 442      Lab ordered: None at this time   4. Tobacco Use Disorder             -- Patient refused starting NicoDerm patch, will follow             -- Smoking cessation encouraged   5. Group and Therapy: -- Encouraged patient to participate in unit milieu and in scheduled group therapies              --Substance Use counseling: As patient gradually improved cognitively will counsel regarding need to abstain completely from cocaine after discharge.   6. Discharge Planning:              -- Social work and case management to assist with discharge planning and identification of hospital follow-up needs prior to discharge             -- Estimated LOS: 5-7 days             -- Discharge Concerns: Need to establish a safety plan; Medication compliance and effectiveness             -- Discharge Goals: Return home with  outpatient referrals for mental health follow-up including medication management/psychotherapy         Total Time Spent in Direct Patient Care:  I personally spent 35  minutes on the unit in direct patient care. The direct patient care time included face-to-face time with the patient, reviewing the patient's chart, communicating with other professionals, and coordinating care. Greater than 50% of this time was spent in counseling or coordinating care with the patient regarding goals of hospitalization, psycho-education, and discharge planning needs.   Ranen Doolin, MD 11/10/2023, 5:22 PM

## 2023-11-11 ENCOUNTER — Other Ambulatory Visit: Payer: Self-pay

## 2023-11-11 ENCOUNTER — Other Ambulatory Visit (HOSPITAL_COMMUNITY): Payer: Self-pay

## 2023-11-11 DIAGNOSIS — F200 Paranoid schizophrenia: Secondary | ICD-10-CM | POA: Diagnosis not present

## 2023-11-11 MED ORDER — GABAPENTIN 300 MG PO CAPS
300.0000 mg | ORAL_CAPSULE | Freq: Three times a day (TID) | ORAL | 0 refills | Status: DC
  Filled 2023-11-11: qty 90, 30d supply, fill #0

## 2023-11-11 MED ORDER — HYDROXYZINE HCL 25 MG PO TABS
25.0000 mg | ORAL_TABLET | Freq: Three times a day (TID) | ORAL | 0 refills | Status: DC | PRN
  Filled 2023-11-11: qty 60, 20d supply, fill #0

## 2023-11-11 MED ORDER — TRAZODONE HCL 50 MG PO TABS
50.0000 mg | ORAL_TABLET | Freq: Every evening | ORAL | 0 refills | Status: DC | PRN
  Filled 2023-11-11: qty 30, 30d supply, fill #0

## 2023-11-11 MED ORDER — OLANZAPINE 10 MG PO TABS
ORAL_TABLET | ORAL | 0 refills | Status: DC
  Filled 2023-11-11: qty 90, 30d supply, fill #0

## 2023-11-11 MED ORDER — AMLODIPINE BESYLATE 10 MG PO TABS
10.0000 mg | ORAL_TABLET | Freq: Every day | ORAL | 0 refills | Status: DC
Start: 2023-11-12 — End: 2023-12-15
  Filled 2023-11-11: qty 30, 30d supply, fill #0

## 2023-11-11 NOTE — Discharge Summary (Addendum)
 Physician Discharge Summary Note  Patient:  Aaron Rubio is an 43 y.o., male MRN:  [id number] DOB:  Apr 02, 1981 Patient phone:  [phone] (home)  Patient address:   [address] [place] [address],  Total Time spent with patient: 30 minutes  Date of Admission:  11/03/2023 Date of Discharge: 11/11/23  Reason for Admission:   As per H&P: " 43 y.o., male with a past psychiatric history significant for paranoid schizophrenia who presents to the [facility] from [facility] emergency department for evaluation and management of worsening psychosis.  According to outside records, the patient presented to emergency room paranoid and responding to stimuli   Initial assessment on 11/03/2023, patient was evaluated on the inpatient unit, the patient presents disorganized and psychotic unable to participate in history taking, nonsensical thought process and speech noted, presenting paranoid refusing to be on any medications for blood pressure or for psychosis even when encouraged, laughing inappropriately during evaluation, loud and agitated verbally, delusional, unable to answer questions in linear informative manner.  Patient was evaluated for forced medication second eval.    Chart review: Notes from [facility] emergency department were reviewed   Psych meds prior to admission:  Was discharged from this facility on 4/28 on Seroquel  25 mg twice daily, Seroquel  100 mg at bedtime and Prozac  10 mg daily.  Per patient's report has been noncompliant with medications since discharge  Principal Problem: Chronic paranoid schizophrenia [facility]) Discharge Diagnoses: Principal Problem:   Chronic paranoid schizophrenia [facility])   Past Psychiatric History:  Prior Psychiatric diagnoses: Schizophrenia Past Psychiatric Hospitalizations:  Multiple emergency room presentations for SI in the past, this is his second hospitalization, last hospitalization was in April for few days. History  of self mutilation: None noted Past suicide attempts: None noted Past history of HI, violent or aggressive behavior: Unknown   Past Psychiatric medications trials: Responded to Seroquel  during previous hospitalization, was discharged on Seroquel  25 mg twice daily and 100 mg at bedtime History of ECT/TMS: None noted   Outpatient psychiatric Follow up: Noncompliant Prior Outpatient Therapy: Noncompliant  Past Medical History:  Past Medical History:  Diagnosis Date   Hypertension    Schizophrenia (HCC)    History reviewed. No pertinent surgical history. Family History: History reviewed. No pertinent family history. Family Psychiatric  History:  Per chart review patient reported previously that he has no family history of mental illness or suicide.   Social History:  Social History   Substance and Sexual Activity  Alcohol Use Not Currently     Social History   Substance and Sexual Activity  Drug Use Not Currently    Social History   Socioeconomic History   Marital status: Single    Spouse name: Not on file   Number of children: Not on file   Years of education: Not on file   Highest education level: Not on file  Occupational History   Not on file  Tobacco Use   Smoking status: Every Day    Current packs/day: 0.30    Types: Cigarettes   Smokeless tobacco: Not on file  Substance and Sexual Activity   Alcohol use: Not Currently   Drug use: Not Currently   Sexual activity: Not on file  Other Topics Concern   Not on file  Social History Narrative   Not on file   Social Drivers of Health   Financial Resource Strain: Not on file  Food Insecurity: Patient Unable To Answer (11/03/2023)   Hunger Vital Sign  Worried About Programme researcher, broadcasting/film/video in the Last Year: Patient unable to answer    Ran Out of Food in the Last Year: Patient unable to answer  Recent Concern: Food Insecurity - Food Insecurity Present (10/25/2023)   Hunger Vital Sign    Worried About Running Out of  Food in the Last Year: Often true    Ran Out of Food in the Last Year: Often true  Transportation Needs: Patient Unable To Answer (11/03/2023)   PRAPARE - Transportation    Lack of Transportation (Medical): Patient unable to answer    Lack of Transportation (Non-Medical): Patient unable to answer  Physical Activity: Not on file  Stress: Not on file  Social Connections: Unknown (05/08/2022)   Received from [facility]   Social Network    Social Network: Not on file    Hospital Course:    Patient was admitted to inpatient psychiatry at [facility] for safety and stabilization. Patient was provided safe and therapeutic milieu, psychiatric and medical assessment, care and treatment, as well as support from nursing, behavioral health staff. Both psychotherapy and psychoeducation groups were provided. Different coping skills such as journaling, CBT and art therapy groups were offered. Additional consultation was provided by hospitalist for H&P and medical needs.  Patient was started on olanzapine   during the admission for schizophrenia which was titrated to max dose of 30 mg (10 mg qdaily and 20 mg at bedtime). Patient tolerated without side effects and medications were titrated to therapeutic effect. As patient stabilized on medications and participated in therapeutic interventions, symptoms began to improve. Patient denied SI, HI or AVH for >5 days and was transferred from acute unit to general unit without any behavioral problems. Remained calm and cooperative. Patient was not observed to be responding to internal stimuli and speech and thoughts were linear and goal directed for >72 hours prior to discharge. I recommended LAI and sober living for patient repeatedly for >3 days however patient refusing. Patient stated he had hive with Haldol  previously and adverse reaction he could not remember with Risperdal however was compliant with olanzapine  for 5 days prior to discharge.Sister as contacted for collateral  and noted patient was at baseline although has a history of schizophrenia. WE attempted to set up patient with ACT services but his current insurance will not cover so he was referred to social security office to increase his insurance coverage. Patient requested discharge and no longer meet criteria for IVC.   On the day of discharge, the chart was reviewed, case was discussed with staff and the patient was seen in person. Patient's overall mood has improved, reports anxiety about his lack of resources and states he plans to go to social security. I offered patient chance to stay longer in hospital and look for sober living however he is again refusing. Patient was calm and cooperative and did not appear anxious. Patient reported adequate sleep and stable mood. Patient was tolerating medications well without side effects reported or noted. Patient denied suicidal ideation, plan or intent, denied hopelessness, helplessness or worthlessness, and denied homicidal ideation. Insight and judgement have improved. Patient demonstrated future orientation and was motivated to follow-up with aftercare. Patient was encouraged to be adherent with medications. Patient was instructed to call 911, ask for help to go to the closest emergency room or crisis center, call crisis hotlines for help if in critical status or when symptoms were worsening. Patient voiced understanding of this information. At the time of discharge, patient had reached maximum  benefit from hospitalization, was no longer considered to be dangerous to self or others, and was psychiatrically stable and otherwise appropriate for discharge to less restrictive care in the community.   Medical Hospital Course: Patient was seen by the hospitalist for routine admission examination. Medications for chronic conditions were continued. Patient was started amlodipine  10 mg for HTN and recommend to followup with pain management and PCP  Physical Findings: AIMS:  Facial and Oral Movements Muscles of Facial Expression: None Lips and Perioral Area: None Jaw: None Tongue: None,Extremity Movements Upper (arms, wrists, hands, fingers): None Lower (legs, knees, ankles, toes): None, Trunk Movements Neck, shoulders, hips: None, Global Judgements Severity of abnormal movements overall : None Incapacitation due to abnormal movements: None Patient's awareness of abnormal movements: No Awareness, Dental Status Current problems with teeth and/or dentures?: No Does patient usually wear dentures?: No Edentia?: No  CIWA:    COWS:     Musculoskeletal: Strength & Muscle Tone: within normal limits Gait & Station: normal Patient leans: N/A   Psychiatric Specialty Exam:  Presentation  General Appearance:  Appropriate for Environment  Eye Contact: Fair  Speech: Clear and Coherent  Speech Volume: Normal  Handedness: Right   Mood and Affect  Mood: Anxious  Affect: Congruent   Thought Process  Thought Processes: Coherent  Descriptions of Associations:Intact  Orientation:Full (Time, Place and Person)  Thought Content:Logical  History of Schizophrenia/Schizoaffective disorder:Yes  Duration of Psychotic Symptoms:Greater than six months  Hallucinations:Hallucinations: None  Ideas of Reference:None  Suicidal Thoughts:Suicidal Thoughts: No  Homicidal Thoughts:Homicidal Thoughts: No   Sensorium  Memory: Immediate Poor  Judgment: Fair  Insight: Fair   Chartered certified accountant: Fair  Attention Span: Fair  Recall: [employer] of Knowledge: Fair  Language: Fair   Psychomotor Activity  Psychomotor Activity: Psychomotor Activity: Normal   Assets  Assets: Communication Skills; Desire for Improvement; Physical Health; Resilience   Sleep  Sleep: Sleep: Fair    Physical Exam: Physical exam: Please see exam on admit note. General: Well developed, well nourished.  Pupils: Normal at  3mm Respiratory: Breathing is unlabored.  Cardiovascular: No edema.  Language: No anomia, no aphasia Muscle strength and tone-pt moving all extremities.  Gait not assessed as pt remained in bed.  Neuro: Facial muscles are symmetric. Pt without tremor, no evidence of hyperarousal.  Review of Systems  Constitutional: Negative.   HENT: Negative.    Eyes: Negative.   Respiratory: Negative.    Cardiovascular: Negative.   Gastrointestinal: Negative.   Genitourinary: Negative.   Musculoskeletal: Negative.   Skin: Negative.   Neurological: Negative.   Endo/Heme/Allergies: Negative.   Psychiatric/Behavioral:  The patient is nervous/anxious.    Blood pressure (!) 124/95, pulse 85, temperature 98.3 F (36.8 C), temperature source Oral, resp. rate 17, height 6\' 2"  (1.88 m), weight 98.4 kg, SpO2 100%. Body mass index is 27.86 kg/m.   Social History   Tobacco Use  Smoking Status Every Day   Current packs/day: 0.30   Types: Cigarettes  Smokeless Tobacco Not on file   Tobacco Cessation:  N/A, patient does not currently use tobacco products   Blood Alcohol level:  Lab Results  Component Value Date   [facility] <15 11/02/2023   ETH <10 05/17/2023    Metabolic Disorder Labs:  Lab Results  Component Value Date   HGBA1C 5.0 10/27/2023   MPG 96.8 10/27/2023   No results found for: "PROLACTIN" Lab Results  Component Value Date   CHOL 152 10/19/2023   TRIG 144 10/19/2023  HDL 57 10/19/2023   CHOLHDL 2.7 10/19/2023   VLDL 29 10/19/2023   LDLCALC 66 10/19/2023    See Psychiatric Specialty Exam and Suicide Risk Assessment completed by Attending Physician prior to discharge.  Discharge destination:  Home  Is patient on multiple antipsychotic therapies at discharge:  No   Has Patient had three or more failed trials of antipsychotic monotherapy by history:  No  Recommended Plan for Multiple Antipsychotic Therapies: NA   Allergies as of 11/11/2023   No Known Allergies       Medication List     STOP taking these medications    mirtazapine  15 MG tablet Commonly known as: REMERON    QUEtiapine  25 MG tablet Commonly known as: SEROQUEL        TAKE these medications      Indication  amLODipine  10 MG tablet Commonly known as: NORVASC  Take 1 tablet (10 mg total) by mouth daily. Start taking on: Nov 12, 2023  Indication: High Blood Pressure   cyclobenzaprine  10 MG tablet Commonly known as: FLEXERIL  Take 0.5-1 tablets (5-10 mg total) by mouth at bedtime as needed for muscle spasms.  Indication: Muscle Spasm   gabapentin  300 MG capsule Commonly known as: NEURONTIN  Take 1 capsule (300 mg total) by mouth 3 (three) times daily.  Indication: Peripheral Nerve Disease   hydrOXYzine  25 MG tablet Commonly known as: ATARAX  Take 1 tablet (25 mg total) by mouth 3 (three) times daily as needed for anxiety.  Indication: Feeling Anxious   OLANZapine  10 MG tablet Commonly known as: ZYPREXA  Take 1 tablet (10 mg) in the morning daily and 2 tabs (20 mg) every night Start taking on: Nov 12, 2023  Indication: Schizophrenia   traZODone  50 MG tablet Commonly known as: DESYREL  Take 1 tablet (50 mg total) by mouth at bedtime as needed for sleep.  Indication: Trouble Sleeping        Follow-up Information     Randine Butcher, MD Follow up on 11/20/2023.   Specialty: Psychiatry Why: Please call this provider personally, on 11/20/23 at 9:00 am to schedule an appointment for medication management services.  You will need to give a credit card number for the provider to keep on file for you, for incidental charges. Contact information: [address] [place] [address] [phone]         [place] Go on 11/22/2023.   Specialty: Professional Counselor Why: Please go to this provider on 11/22/23 at 9:00 am for an assessment, to obtain therapy services. You may also go on Monday through Friday, between 9 am to 1 pm for an  assessment, during the walk in hours for new patients. Contact information: [address] [place]  [address] [place] [address] [phone]         [facility] at [facility] Follow up on 11/14/2023.   Specialty: Behavioral Health Why: You have an appointment on 11/14/23 at 1:00 pm .  * You must call to confirm this appointment and have a phone number and be signed up with [facility] 'MyChart' account, to keep the appt. Contact information: [address] [place]  [address] [phone]        [facility], [facility] Gammons Follow up.   Why: I understand that you are not interested in inpatient substance use treatment at this time.  If anything changes, please call the number above. Contact information: [place] [phone]  Follow-up recommendations:  Other:  requested ACT referral however patient's insurance does not cover ACT services -PCP referral for HTN management - pain management referral recommended to patient and he will have to follow with PCP   Reached patient's sister on day of discharge and she states he is at baseline, multiple sober living houses were offered to patient the last 3 days but he is declining stating he wants to go to a hotel. Patient has not been responding to internal stimuli as he was on admission and is linear, goal directed and future oriented. Has not been agitated for past 5 days and denies SI, HI or AVH. Requesting discharge. Patient has repeatedly refused LAI stating he will take olanzapine  30 mg which he reports benefit from.    Signed: Cornelia Walraven, MD 11/11/2023, 11:30 AM

## 2023-11-11 NOTE — BHH Counselor (Signed)
 CSW and Provider spoke to patient about his discharge planning. The client was able to come up with a plan. The CSW was able to provide patient with transportation and gave the patient a phone number to obtain his Medical record [phone]. The patient stated another CSW informed him about how to get a better insurance by going up to [employer]. The client stated that he will follow through.

## 2023-11-11 NOTE — BHH Suicide Risk Assessment (Signed)
 [facility] Discharge Suicide Risk Assessment   Principal Problem: Chronic paranoid schizophrenia (HCC) Discharge Diagnoses: Principal Problem:   Chronic paranoid schizophrenia (HCC)   Total Time spent with patient: 30 minutes  Musculoskeletal: Strength & Muscle Tone: within normal limits Gait & Station: normal Patient leans: N/A  Psychiatric Specialty Exam  Presentation  General Appearance:  Casual  Eye Contact: Other (comment) (stares)  Speech: Clear and Coherent  Speech Volume: Increased  Handedness: Right   Mood and Affect  Mood: Irritable  Duration of Depression Symptoms: Greater than two weeks  Affect: Congruent   Thought Process  Thought Processes: Goal Directed  Descriptions of Associations:Loose  Orientation:Full (Time, Place and Person)  Thought Content:Rumination  History of Schizophrenia/Schizoaffective disorder:Yes  Duration of Psychotic Symptoms:Greater than six months  Hallucinations:No data recorded Ideas of Reference:None  Suicidal Thoughts:No data recorded Homicidal Thoughts:No data recorded  Sensorium  Memory: Immediate Poor  Judgment: Poor  Insight: Poor   Executive Functions  Concentration: Fair  Attention Span: Fair  Recall: Fair  Fund of Knowledge: Fair  Language: Fair   Psychomotor Activity  Psychomotor Activity:No data recorded  Assets  Assets: Communication Skills; Desire for Improvement   Sleep  Sleep:No data recorded  Physical Exam: Physical exam: Please see exam on admit note. General: Well developed, well nourished.  Pupils: Normal at 3mm Respiratory: Breathing is unlabored.  Cardiovascular: No edema.  Language: No anomia, no aphasia Muscle strength and tone-pt moving all extremities.  Gait not assessed as pt remained in bed.  Neuro: Facial muscles are symmetric. Pt without tremor, no evidence of hyperarousal.  Review of Systems  Constitutional: Negative.   HENT: Negative.     Eyes: Negative.   Respiratory: Negative.    Cardiovascular: Negative.   Gastrointestinal: Negative.   Genitourinary: Negative.   Musculoskeletal: Negative.   Skin: Negative.   Neurological: Negative.   Endo/Heme/Allergies: Negative.   Psychiatric/Behavioral: Negative.     Blood pressure (!) 124/95, pulse 85, temperature 98.3 F (36.8 C), temperature source Oral, resp. rate 17, height 6\' 2"  (1.88 m), weight 98.4 kg, SpO2 100%. Body mass index is 27.86 kg/m.  Mental Status Per Nursing Assessment::   On Admission:     Demographic Factors:  Male, Low socioeconomic status, Living alone, and Unemployed  Loss Factors: Decrease in vocational status, Legal issues, and Financial problems/change in socioeconomic status  Historical Factors: Impulsivity  Risk Reduction Factors:   Positive social support and Positive coping skills or problem solving skills  Continued Clinical Symptoms:  Schizophrenia:   Paranoid or undifferentiated type  Cognitive Features That Contribute To Risk:  Polarized thinking    Suicide Risk:  Minimal: No identifiable suicidal ideation.  Patients presenting with no risk factors but with morbid ruminations; may be classified as minimal risk based on the severity of the depressive symptoms  Patient denies SI or HI for >48 hours. Denies wanting to be dead and future oriented. SRA complete and acute risk for suicide is low.   [place] Suicide Risk assessment:  1. Do you wish to be dead? NONE REPORTED 2. Have you wished your dead or wished you could go to sleep and not wake up? NONE REPORTED 3.  Have you actually had thoughts of killing yourself?  NONE REPORTED 4.  Have you been thinking about how you might do this?  NONE REPORTED 5.  Have you had these thoughts and some intention of acting on them? NONE REPORTED 6.  Have you started to work out or worked out the details to  kill yourself? NONE REPORTED 7.  Do you intend to carry out this plan? NONE REPORTED 8.  On a scale of 1-5 with 1 being the least severe and 5 being the most severe answer the following questions place for intensity of ideation. ZERO 9. How many times have you had these thoughts? NONE REPORTED 10. When you have the thoughts how long to the last?  NONE REPORTED 11. Control ability.  Could you or can you stop thinking about killing herself or wanting to die if you want to?  YES 12. Are there any things anyone or anything family religion pain of death that stop you from wanting to die or acting on thoughts of committing suicide?  FAMILY 13.  What sort of reason to do have to think about wanting to die or killing yourself? NONE REPORTED 14.Was it to end the pain or stop the way you are feeling in other words you could not go on living with his pain or how you are feeling or was not to get attention revenge or reaction from others?  Or both?  NONE REPORTED  [place] Suicide Risk assessment:  1. Do you wish to be dead? NONE REPORTED 2. Have you wished your dead or wished you could go to sleep and not wake up? NONE REPORTED 3.  Have you actually had thoughts of killing yourself?  NONE REPORTED 4.  Have you been thinking about how you might do this?  NONE REPORTED 5.  Have you had these thoughts and some intention of acting on them? NONE REPORTED 6.  Have you started to work out or worked out the details to kill yourself? NONE REPORTED 7.  Do you intend to carry out this plan? NONE REPORTED 8. On a scale of 1-5 with 1 being the least severe and 5 being the most severe answer the following questions place for intensity of ideation. ZERO 9. How many times have you had these thoughts? NONE REPORTED 10. When you have the thoughts how long to the last?  NONE REPORTED 11. Control ability.  Could you or can you stop thinking about killing herself or wanting to die if you want to?  YES 12. Are there any things anyone or anything family religion pain of death that stop you from wanting to die or  acting on thoughts of committing suicide?  FAMILY 13.  What sort of reason to do have to think about wanting to die or killing yourself? NONE REPORTED 14.Was it to end the pain or stop the way you are feeling in other words you could not go on living with his pain or how you are feeling or was not to get attention revenge or reaction from others?  Or both?  NONE REPORTED    Follow-up Information     Randine Butcher, MD Follow up on 11/20/2023.   Specialty: Psychiatry Why: Please call this provider personally, on 11/20/23 at 9:00 am to schedule an appointment for medication management services.  You will need to give a credit card number for the provider to keep on file for you, for incidental charges. Contact information: [address] [place] [address] [phone]         [place] Go on 11/22/2023.   Specialty: Professional Counselor Why: Please go to this provider on 11/22/23 at 9:00 am for an assessment, to obtain therapy services. You may also go on Monday through Friday, between 9 am to 1 pm for  an assessment, during the walk in hours for new patients. Contact information: [address] [place]  [address] [place] [address] [phone]         [facility] at [facility] Follow up on 11/14/2023.   Specialty: Behavioral Health Why: You have an appointment on 11/14/23 at 1:00 pm .  * You must call to confirm this appointment and have a phone number and be signed up with [facility] 'MyChart' account, to keep the appt. Contact information: [address] [place]  [address] [phone]        [facility], [facility] Gammons Follow up.   Why: I understand that you are not interested in inpatient substance use treatment at this time.  If anything changes, please call the number above. Contact information: Moira Andrews [phone]                Plan Of  Care/Follow-up recommendations:  Other:  outpatient psychiatric followup as above  Nardos Putnam, MD 11/11/2023, 11:25 AM

## 2023-11-11 NOTE — Progress Notes (Signed)
  [facility] Adult Case Management Discharge Plan :  Will you be returning to the same living situation after discharge:  Yes,  the patient will be going to [facility] At discharge, do you have transportation home?: Yes,  the patient will be taking a taxi to the motel Do you have the ability to pay for your medications: No.  Release of information consent forms completed and in the chart;  Patient's signature needed at discharge.  Patient to Follow up at:  Follow-up Information     Randine Butcher, MD Follow up on 11/20/2023.   Specialty: Psychiatry Why: Please call this provider personally, on 11/20/23 at 9:00 am to schedule an appointment for medication management services.  You will need to give a credit card number for the provider to keep on file for you, for incidental charges. Contact information: [address] [place] [address] [phone]         [facility] Go on 11/22/2023.   Specialty: Professional Counselor Why: Please go to this provider on 11/22/23 at 9:00 am for an assessment, to obtain therapy services. You may also go on Monday through Friday, between 9 am to 1 pm for an assessment, during the walk in hours for new patients. Contact information: [address] [place]  [address] [place] [address] [phone]         [facility] Follow up on 11/14/2023.   Specialty: Behavioral Health Why: You have an appointment on 11/14/23 at 1:00 pm .  * You must call to confirm this appointment and have a phone number and be signed up with [facility] 'MyChart' account, to keep the appt. Contact information: [address] [place]   [address] [phone]        [facility], Ulyess Gammons Follow up.   Why: I understand that you are not interested in inpatient substance use treatment at this time.  If anything changes, please call the number above. Contact  information: Moira Andrews [phone]                Next level of care provider has access to [facility] Link:no  Safety Planning and Suicide Prevention discussed: Yes,  CSW spoke to patient at time of discharge of SPE the patient acknowledge and understand the signs      Has patient been referred to the Quitline?: Patient refused referral for treatment  Patient has been referred for addiction treatment: Patient refused referral for treatment; referral information given to patient at discharge.  Teckla Christiansen O Asjia Berrios, LCSWA 11/11/2023, 11:33 AM

## 2023-11-11 NOTE — Plan of Care (Signed)
  Problem: Education: Goal: Verbalization of understanding the information provided will improve Outcome: Progressing   Problem: Coping: Goal: Ability to demonstrate self-control will improve Outcome: Progressing   

## 2023-11-11 NOTE — Progress Notes (Signed)
   11/11/23 0513  15 Minute Checks  Location Bedroom  Visual Appearance Calm  Behavior Sleeping  Sleep (Behavioral Health Patients Only)  Calculate sleep? (Click Yes once per 24 hr at 0600 safety check) Yes  Documented sleep last 24 hours 6.5

## 2023-11-11 NOTE — Progress Notes (Signed)
 D: Pt A&O x 4. Denies SI, HI, AVH, and pain. D/c home as ordered. Picked up in lobby by taxi service.   A: D/c instructions reviewed with pt including electronic prescriptions, medication samples, and follow up appointments; compliance encouraged. All belongings from locker given to pt at time of departure. Scheduled meds given with verbal education and effects monitored. Safety checks maintained without incident until time of d/c.   R: Pt receptive to care. Compliant with meds when offered. Denies adverse drug reactions when assessed. Verbalized understanding related to d/c instructions. Signed belongings sheet in agreement with items received from locker. Ambulatory with steady gait. Appears to be in no distress at time of departure.

## 2023-11-11 NOTE — [facility] Suicide Risk Assessment (Signed)
 [facility] INPATIENT:  Family/Significant Other Suicide Prevention Education  Suicide Prevention Education:  Education Completed; Mudlogger,  (name of family member/significant other) has been identified by the patient as the family member/significant other with whom the patient will be residing, and identified as the person(s) who will aid the patient in the event of a mental health crisis (suicidal ideations/suicide attempt).  With written consent from the patient, the family member/significant other has been provided the following suicide prevention education, prior to the and/or following the discharge of the patient.  The suicide prevention education provided includes the following: Suicide risk factors Suicide prevention and interventions National Suicide Hotline telephone number [facility] assessment telephone number [facility] Emergency Assistance 911 [facility] and/or Residential Mobile Crisis Unit telephone number  Request made of family/significant other to: Remove weapons (e.g., guns, rifles, knives), all items previously/currently identified as safety concern.   Remove drugs/medications (over-the-counter, prescriptions, illicit drugs), all items previously/currently identified as a safety concern.  The family member/significant other verbalizes understanding of the suicide prevention education information provided.  The family member/significant other agrees to remove the items of safety concern listed above.  CSW spoke to patient on SPE at discharge, the patient acknowledge and under stand  Aaron Rubio Aaron Rubio 11/11/2023, 11:37 AM

## 2023-11-14 ENCOUNTER — Ambulatory Visit (HOSPITAL_COMMUNITY)
Admission: EM | Admit: 2023-11-14 | Discharge: 2023-11-15 | Disposition: A | Discharge status: Other Acute Inpatient Hospital | Attending: Psychiatry | Admitting: Psychiatry

## 2023-11-14 ENCOUNTER — Encounter (HOSPITAL_COMMUNITY): Payer: Self-pay

## 2023-11-14 ENCOUNTER — Ambulatory Visit (HOSPITAL_COMMUNITY): Admitting: Registered Nurse

## 2023-11-14 DIAGNOSIS — F1910 Other psychoactive substance abuse, uncomplicated: Secondary | ICD-10-CM

## 2023-11-14 DIAGNOSIS — F200 Paranoid schizophrenia: Secondary | ICD-10-CM

## 2023-11-14 DIAGNOSIS — R45851 Suicidal ideations: Secondary | ICD-10-CM

## 2023-11-14 DIAGNOSIS — Z765 Malingerer [conscious simulation]: Secondary | ICD-10-CM

## 2023-11-14 DIAGNOSIS — Z5900 Homelessness unspecified: Secondary | ICD-10-CM | POA: Insufficient documentation
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
## 2023-11-15 DIAGNOSIS — F1910 Other psychoactive substance abuse, uncomplicated: Secondary | ICD-10-CM

## 2023-11-15 DIAGNOSIS — Z5900 Homelessness unspecified: Secondary | ICD-10-CM

## 2023-11-15 DIAGNOSIS — Z765 Malingerer [conscious simulation]: Secondary | ICD-10-CM | POA: Diagnosis not present

## 2023-11-15 DIAGNOSIS — R45851 Suicidal ideations: Secondary | ICD-10-CM | POA: Diagnosis present

## 2023-11-15 DIAGNOSIS — F200 Paranoid schizophrenia: Secondary | ICD-10-CM | POA: Diagnosis not present

## 2023-11-15 MED ORDER — OLANZAPINE 5 MG PO TBDP: 5.0000 mg | ORAL_TABLET | Freq: Three times a day (TID) | ORAL | Status: DC | PRN

## 2023-11-15 MED ORDER — OLANZAPINE 10 MG IM SOLR: 10.0000 mg | Freq: Three times a day (TID) | INTRAMUSCULAR | Status: DC | PRN

## 2023-11-15 MED ORDER — ALUM & MAG HYDROXIDE-SIMETH 200-200-20 MG/5ML PO SUSP: 30.0000 mL | ORAL | Status: DC | PRN

## 2023-11-15 MED ORDER — OLANZAPINE 10 MG IM SOLR: 5.0000 mg | Freq: Three times a day (TID) | INTRAMUSCULAR | Status: DC | PRN

## 2023-11-15 MED ORDER — CYCLOBENZAPRINE HCL 10 MG PO TABS: 5.0000 mg | ORAL_TABLET | Freq: Every evening | ORAL | Status: DC | PRN

## 2023-11-15 MED ORDER — AMLODIPINE BESYLATE 10 MG PO TABS
10.0000 mg | ORAL_TABLET | Freq: Every day | ORAL | Status: DC
  Administered 2023-11-15: 10 mg via ORAL
  Filled 2023-11-15: qty 1

## 2023-11-15 MED ORDER — LOPERAMIDE HCL 2 MG PO CAPS: 2.0000 mg | ORAL_CAPSULE | ORAL | Status: DC | PRN

## 2023-11-15 MED ORDER — TRAZODONE HCL 50 MG PO TABS
50.0000 mg | ORAL_TABLET | Freq: Every evening | ORAL | Status: DC | PRN
Start: 2023-11-15 — End: 2023-11-15

## 2023-11-15 MED ORDER — ONDANSETRON 4 MG PO TBDP: 4.0000 mg | ORAL_TABLET | Freq: Four times a day (QID) | ORAL | Status: DC | PRN

## 2023-11-15 MED ORDER — HYDROXYZINE HCL 25 MG PO TABS: 25.0000 mg | ORAL_TABLET | Freq: Four times a day (QID) | ORAL | Status: DC | PRN

## 2023-11-15 MED ORDER — MAGNESIUM HYDROXIDE 400 MG/5ML PO SUSP: 30.0000 mL | Freq: Every day | ORAL | Status: DC | PRN

## 2023-11-15 MED ORDER — ACETAMINOPHEN 325 MG PO TABS: 650.0000 mg | ORAL_TABLET | Freq: Four times a day (QID) | ORAL | Status: DC | PRN

## 2023-11-15 MED ORDER — NAPROXEN 500 MG PO TABS: 500.0000 mg | ORAL_TABLET | Freq: Two times a day (BID) | ORAL | Status: DC | PRN

## 2023-11-15 MED ORDER — GABAPENTIN 300 MG PO CAPS
300.0000 mg | ORAL_CAPSULE | Freq: Three times a day (TID) | ORAL | Status: DC
  Administered 2023-11-15: 300 mg via ORAL
  Filled 2023-11-15: qty 1

## 2023-11-15 MED ORDER — OLANZAPINE 10 MG PO TABS: 20.0000 mg | ORAL_TABLET | Freq: Every day | ORAL | Status: DC

## 2023-11-15 MED ORDER — METHOCARBAMOL 500 MG PO TABS
500.0000 mg | ORAL_TABLET | Freq: Three times a day (TID) | ORAL | Status: DC | PRN
Start: 2023-11-15 — End: 2023-11-15

## 2023-11-15 MED ORDER — DICYCLOMINE HCL 20 MG PO TABS: 20.0000 mg | ORAL_TABLET | Freq: Four times a day (QID) | ORAL | Status: DC | PRN

## 2023-11-15 NOTE — Progress Notes (Signed)
 Pt has been accepted to [facility]  11/15/2023  Bed assignment: Main campus  Pt meets inpatient criteria per: Nickola Baron   Attending Physician will be Lavona Pounds, MD  Report can be called to: [phone] (this is a pager, please leave call-back number when giving report)  Pt can arrive ASAP   Care Team Notified: Nickola Baron NP, Lucrezia Sachs RN  [place] Tatiyana Foucher LCSW-A   11/15/2023 11:02 AM

## 2023-11-15 NOTE — ED Notes (Signed)
 Writer observed pt responding to internal stimuli but pt denies AVH. Awaiting safe transport.

## 2023-11-15 NOTE — Progress Notes (Signed)
 LCSW Progress Note  [id number]   Aaron Rubio  11/15/2023  10:25 AM  Description:   Inpatient Psychiatric Referral  Patient was recommended inpatient per Dorthea Gauze NP. There are no available beds at [facility], per [facility] Bevin Bucks RN. Patient was referred to the following out of network facilities:   [facility] Provider Address Phone Fax  [facility] [address], [place] [address] [phone]  [facility] [address] [place], [place] [address] [phone]  [facility] [address] [place] [address] [phone]  [facility] [address], [place] [address] [phone]  [facility] [address], [place] [address] [phone]  [facility] [address] [place] [address] [phone]  [facility] EFAX [address] [place], [place] [phone]  [facility] [address] [place] [address] [phone]      Situation ongoing, CSW to continue following and update chart as more information becomes available.      [place] Pheonix Clinkscale, MSW, LCSW  11/15/2023 10:25 AM

## 2023-11-15 NOTE — ED Notes (Signed)
 Nurse to Nurse report called to Jonette Nestle, RN @[facility]. Safe transport called.

## 2023-11-15 NOTE — ED Notes (Signed)
 Pt is currently sleeping, no distress noted, environmental check complete, will continue to monitor patient for safety.
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
## 2023-11-15 NOTE — Discharge Instructions (Signed)
 Accepted to [facility] 11/15/2023. Bed assignment: Main campus

## 2023-11-15 NOTE — ED Notes (Signed)
 Patient A&Ox4. Denies. Pt continue to endorse SI but no plan/intent. Pt states, "I'm just tired living my life this way on the streets and using drugs. I use drugs to cope with everyday life. Denies HI/A/VH. Patient denies any physical complaints when asked. Support and encouragement provided. Routine safety checks conducted according to facility protocol. Encouraged patient to notify staff if thoughts of harm toward self or others arise. Patient verbalize understanding and agreement. Will continue to monitor for safety.

## 2023-11-15 NOTE — ED Provider Notes (Signed)
 FBC/OBS ASAP Discharge Summary  Date and Time: 11/15/2023 11:33 AM  Name: Aaron Rubio  MRN:  [id number]   Discharge Diagnoses:  Final diagnoses:  Polysubstance abuse (HCC)  Suicidal ideation  Schizophrenia, paranoid (HCC)    Subjective: On evaluation, patient is alert and partially oriented. He is disoriented to year and states that it is 2018 or 2021. His thought process is disorganized with circumstantial association. Thought content is positive for SI, auditory and visual hallucinations. Mood is anxious and affect is congruent. He has slow speech. He appears disheveled. Patient reports that he is having problems that are silent and mean. He then begins laughing inappropriately. He reports visual hallucinations of seeing shadows. He states that somebody stole his bag with his medications in there, Seroquel  and Remeron . He states that the voices are telling him that he is suicidal. He denies homicidal ideations. He denies difficulty sleeping last night and states that he had not slept in a couple days. He reports a good appetite and states that he is hungry. He reports using cocaine since he was a kid anytime that he can get it. UDS pending collections. He denies drinking alcohol. He denies medication side effects. He denies physical pain.   Stay Summary: Aaron Rubio is a 43 year old male patient with a past psychiatric history significant for paranoid schizophrenia and polysubstance abuse who presented to the [facility] behavioral health urgent care voluntary with complaints of suicidal ideations with a plan to keep going until he drops dead. Patient was noted to be disorganized on arrival. Per chart review, patient was recently hospitalized at the [facility] from 11/03/2023 to 11/11/2023. He was prescribed Norvasc  10 mg p.o. daily, Flexeril  5-10 mg at bedtime as needed, gabapentin  300 mg 3 times daily, Vistaril  25 mg 3 times daily as needed, olanzapine  10 mg in the  morning and 20 mg at bedtime and trazodone  50 mg at bedtime as needed.    Total Time spent with patient: 30 minutes  Past Psychiatric History: a past psychiatric history significant for paranoid schizophrenia and polysubstance abuse  Past Medical History: history of HTN.   Family Psychiatric History: No reported history.   Social History: homelessness. Cocaine use. Unemployed.   Tobacco Cessation:  N/A, patient does not currently use tobacco products  Current Medications:  Current Facility-Administered Medications  Medication Dose Route Frequency Provider Last Rate Last Admin   acetaminophen  (TYLENOL ) tablet 650 mg  650 mg Oral Q6H PRN Dorthea Gauze, NP       alum & mag hydroxide-simeth (MAALOX/MYLANTA) 200-200-20 MG/5ML suspension 30 mL  30 mL Oral Q4H PRN Dorthea Gauze, NP       amLODipine  (NORVASC ) tablet 10 mg  10 mg Oral Daily Dorthea Gauze, NP   10 mg at 11/15/23 1008   cyclobenzaprine  (FLEXERIL ) tablet 5-10 mg  5-10 mg Oral QHS PRN Dorthea Gauze, NP       dicyclomine (BENTYL) tablet 20 mg  20 mg Oral Q6H PRN Dorthea Gauze, NP       gabapentin  (NEURONTIN ) capsule 300 mg  300 mg Oral TID Dorthea Gauze, NP   300 mg at 11/15/23 1008   hydrOXYzine  (ATARAX ) tablet 25 mg  25 mg Oral Q6H PRN Dorthea Gauze, NP       loperamide (IMODIUM) capsule 2-4 mg  2-4 mg Oral PRN Dorthea Gauze, NP       magnesium  hydroxide (MILK OF MAGNESIA) suspension 30 mL  30 mL Oral Daily PRN Dorthea Gauze, NP  methocarbamol (ROBAXIN) tablet 500 mg  500 mg Oral Q8H PRN Dorthea Gauze, NP       naproxen  (NAPROSYN ) tablet 500 mg  500 mg Oral BID PRN Dorthea Gauze, NP       OLANZapine  (ZYPREXA ) injection 10 mg  10 mg Intramuscular TID PRN Dorthea Gauze, NP       OLANZapine  (ZYPREXA ) injection 5 mg  5 mg Intramuscular TID PRN Dorthea Gauze, NP       OLANZapine  (ZYPREXA ) tablet 20 mg  20 mg Oral QHS Dorthea Gauze, NP       OLANZapine  zydis (ZYPREXA ) disintegrating tablet 5 mg  5 mg Oral TID PRN Dorthea Gauze, NP        ondansetron  (ZOFRAN -ODT) disintegrating tablet 4 mg  4 mg Oral Q6H PRN Dorthea Gauze, NP       traZODone  (DESYREL ) tablet 50 mg  50 mg Oral QHS PRN Dorthea Gauze, NP       Current Outpatient Medications  Medication Sig Dispense Refill   amLODipine  (NORVASC ) 10 MG tablet Take 1 tablet (10 mg total) by mouth daily. 30 tablet 0   gabapentin  (NEURONTIN ) 300 MG capsule Take 1 capsule (300 mg total) by mouth 3 (three) times daily. 90 capsule 0   hydrOXYzine  (ATARAX ) 25 MG tablet Take 1 tablet (25 mg total) by mouth 3 (three) times daily as needed for anxiety. 60 tablet 0   OLANZapine  (ZYPREXA ) 10 MG tablet Take 1 tablet (10 mg) in the morning daily and 2 tablets (20 mg) every night 90 tablet 0   traZODone  (DESYREL ) 50 MG tablet Take 1 tablet (50 mg total) by mouth at bedtime as needed for sleep. 30 tablet 0   cyclobenzaprine  (FLEXERIL ) 10 MG tablet Take 0.5-1 tablets (5-10 mg total) by mouth at bedtime as needed for muscle spasms. (Patient not taking: Reported on 11/15/2023) 7 tablet 0    PTA Medications:  Facility Ordered Medications  Medication   acetaminophen  (TYLENOL ) tablet 650 mg   alum & mag hydroxide-simeth (MAALOX/MYLANTA) 200-200-20 MG/5ML suspension 30 mL   magnesium  hydroxide (MILK OF MAGNESIA) suspension 30 mL   dicyclomine (BENTYL) tablet 20 mg   hydrOXYzine  (ATARAX ) tablet 25 mg   loperamide (IMODIUM) capsule 2-4 mg   methocarbamol (ROBAXIN) tablet 500 mg   naproxen  (NAPROSYN ) tablet 500 mg   ondansetron  (ZOFRAN -ODT) disintegrating tablet 4 mg   OLANZapine  zydis (ZYPREXA ) disintegrating tablet 5 mg   OLANZapine  (ZYPREXA ) injection 5 mg   OLANZapine  (ZYPREXA ) injection 10 mg   amLODipine  (NORVASC ) tablet 10 mg   cyclobenzaprine  (FLEXERIL ) tablet 5-10 mg   gabapentin  (NEURONTIN ) capsule 300 mg   traZODone  (DESYREL ) tablet 50 mg   OLANZapine  (ZYPREXA ) tablet 20 mg   PTA Medications  Medication Sig   amLODipine  (NORVASC ) 10 MG tablet Take 1 tablet (10 mg total) by mouth  daily.   hydrOXYzine  (ATARAX ) 25 MG tablet Take 1 tablet (25 mg total) by mouth 3 (three) times daily as needed for anxiety.   OLANZapine  (ZYPREXA ) 10 MG tablet Take 1 tablet (10 mg) in the morning daily and 2 tablets (20 mg) every night   traZODone  (DESYREL ) 50 MG tablet Take 1 tablet (50 mg total) by mouth at bedtime as needed for sleep.   gabapentin  (NEURONTIN ) 300 MG capsule Take 1 capsule (300 mg total) by mouth 3 (three) times daily.   cyclobenzaprine  (FLEXERIL ) 10 MG tablet Take 0.5-1 tablets (5-10 mg total) by mouth at bedtime as needed for muscle spasms. (Patient not taking: Reported on 11/15/2023)  No data to display          Flowsheet Row ED from 11/14/2023 in [facility] (Discharged) from 11/03/2023 in [facility] INPATIENT ADULT 400B ED from 11/02/2023 in [facility] Emergency Department at [facility]  C-SSRS RISK CATEGORY High Risk High Risk Low Risk       Musculoskeletal  Strength & Muscle Tone: within normal limits Gait & Station: normal Patient leans: N/A  Psychiatric Specialty Exam  Presentation  General Appearance:  Disheveled  Eye Contact: Minimal  Speech: Slow  Speech Volume: Decreased  Handedness: Right   Mood and Affect  Mood: Anxious  Affect: Congruent   Thought Process  Thought Processes: Disorganized  Descriptions of Associations:Circumstantial  Orientation:Partial  Thought Content:Illogical; Scattered  Diagnosis of Schizophrenia or Schizoaffective disorder in past: Yes  Duration of Psychotic Symptoms: Greater than six months   Hallucinations:Hallucinations: Auditory; Visual  Ideas of Reference:None  Suicidal Thoughts:Suicidal Thoughts: Yes, Active SI Passive Intent and/or Plan: With Intent; With Plan  Homicidal Thoughts:Homicidal Thoughts: No   Sensorium  Memory: Immediate Poor; Recent Poor; Remote Poor  Judgment: Poor  Insight: Poor   Executive  Functions  Concentration: Poor  Attention Span: Poor  Recall: Poor  Fund of Knowledge: Poor  Language: Poor   Psychomotor Activity  Psychomotor Activity: Psychomotor Activity: Restlessness   Assets  Assets: Desire for Improvement   Sleep  Sleep: Sleep: Fair Number of Hours of Sleep: 8   Nutritional Assessment (For OBS and FBC admissions only) Has the patient had a weight loss or gain of 10 pounds or more in the last 3 months?: No Has the patient had a decrease in food intake/or appetite?: No Does the patient have dental problems?: No Does the patient have eating habits or behaviors that may be indicators of an eating disorder including binging or inducing vomiting?: No Has the patient recently lost weight without trying?: 0 Has the patient been eating poorly because of a decreased appetite?: 0 Malnutrition Screening Tool Score: 0    Physical Exam  Physical Exam Cardiovascular:     Rate and Rhythm: Normal rate.  Pulmonary:     Effort: Pulmonary effort is normal.  Musculoskeletal:        General: Normal range of motion.     Cervical back: Normal range of motion.  Neurological:     Mental Status: He is alert.    Review of Systems  Constitutional: Negative.   HENT: Negative.    Eyes: Negative.   Respiratory: Negative.    Cardiovascular: Negative.   Gastrointestinal: Negative.   Genitourinary: Negative.   Musculoskeletal: Negative.   Psychiatric/Behavioral:  Positive for hallucinations and suicidal ideas.    Blood pressure (!) 127/90, pulse 100, temperature 98.7 F (37.1 C), temperature source Oral, resp. rate 20, SpO2 100%. There is no height or weight on file to calculate BMI.   Disposition: Patient has been accepted to [facility] on 11/15/2023. Bed assignment: Main campus   Attending Physician will be Lavona Pounds, MD    Gwenlyn Lento, NP 11/15/2023, 11:33 AM

## 2023-11-15 NOTE — BH Assessment (Signed)
 Comprehensive Clinical Assessment (CCA) Note  11/15/2023 Aaron Rubio [id number]  Disposition: Dorthea Gauze, NP, recommends continuous observation for safety and stabilization with psych reassessment in the AM.   The patient demonstrates the following risk factors for suicide: Chronic risk factors for suicide include: psychiatric disorder of depression, substance use disorder, and previous self-harm "I use a pen and put ink on my hands". Acute risk factors for suicide include: social withdrawal/isolation. Protective factors for this patient include: responsibility to others (children, family) and hope for the future. Considering these factors, the overall suicide risk at this point appears to be high. Patient is not appropriate for outpatient follow up.  Aaron Rubio is a 43 year old male presenting as a voluntary walk-in to [facility] due to SI with plan to "keep going till I drop dead". Patient has history of paranoid schizophrenia and polysubstance abuse. Patient denied HI, psychosis and alcohol usage. Patients most recent psychiatric inpatient treatment was at [facility] from 11/03/23 to 11/11/23 and 10/25/23 to 10/31/23. Patient reports he is currently homeless. Patient reports using unknown amounts of crack cocaine today and reports using a few times weekly. Throughout assessment patient would ramble about 7 people and how someone robbed him, patient was making random statements that were not on topic. Patient does not have a psychiatrist or therapist. Patient denied taking any prescribed psych medications. Patient was calm and cooperative during assessment.  Chief Complaint:  Chief Complaint  Patient presents with   Suicidal   Visit Diagnosis:  Polysubstance Abuse    CCA Screening, Triage and Referral (STR)  Patient Reported Information How did you hear about us ? Self  What Is the Reason for Your Visit/Call Today? Aaron Rubio is a 43 year old male presenting as a voluntary walk-in to [facility] due to  SI with plan to "keep going till I drop dead". Patient has history of paranoid schizophrenia and polysubstance abuse. Patient denied HI, psychosis and alcohol usage. Patient reports he is currently homeless. Patient reports using unknown amounts of crack cocaine today and reports using a few times weekly. Throughout assessment patient would ramble about 7 people and how someone robbed him, patient was making random statements that were not on topic. Patient does not have a psychiatrist or therapist. Patient denied taking any prescribed psych medications. Patient was calm and cooperative during assessment.  How Long Has This Been Causing You Problems? > than 6 months  What Do You Feel Would Help You the Most Today? Treatment for Depression or other mood problem   Have You Recently Had Any Thoughts About Hurting Yourself? Yes  Are You Planning to Commit Suicide/Harm Yourself At This time? Yes   Flowsheet Row ED from 11/14/2023 in [facility] Admission (Discharged) from 11/03/2023 in [facility] INPATIENT ADULT 400B ED from 11/02/2023 in [facility] Emergency Department at [facility]  C-SSRS RISK CATEGORY High Risk High Risk Low Risk       Have you Recently Had Thoughts About Hurting Someone [name]? No  Are You Planning to Harm Someone at This Time? No  Explanation: n/a   Have You Used Any Alcohol or Drugs in the Past 24 Hours? Yes  How Long Ago Did You Use Drugs or Alcohol? N/a What Did You Use and How Much? crack cocaine today, amounts unknown   Do You Currently Have a Therapist/Psychiatrist? No  Name of Therapist/Psychiatrist: Name of Therapist/Psychiatrist: n/a   Have You Been Recently Discharged From Any Office Practice or Programs? No  Explanation of Discharge From Practice/Program: n/a    CCA Screening Triage Referral Assessment Type of Contact: Face-to-Face  Telemedicine Service Delivery:  n/a Is this Initial or  Reassessment?  N/a Date Telepsych consult ordered in [facility]:   N/a Time Telepsych consult ordered in [facility]:   N/a Location of Assessment: [facility] Assessment Services  Provider Location: [facility] Assessment Services   Collateral Involvement: n/a   Does Patient Have a Automotive engineer Guardian? No  Legal Guardian Contact Information: n/a  Copy of Legal Guardianship Form: -- (n/a)  Legal Guardian Notified of Arrival: -- (n/a)  Legal Guardian Notified of Pending Discharge: -- (n/a)  If Minor and Not Living with Parent(s), Who has Custody? n/a  Is CPS involved or ever been involved? Never  Is APS involved or ever been involved? Never   Patient Determined To Be At Risk for Harm To Self or Others Based on Review of Patient Reported Information or Presenting Complaint? Yes, for Self-Harm  Method: No Plan  Availability of Means: No access or NA  Intent: Vague intent or NA  Notification Required: No need or identified person  Additional Information for Danger to Others Potential: -- (n/a)  Additional Comments for Danger to Others Potential: n/a  Are There Guns or Other Weapons in Your Home? No  Types of Guns/Weapons: n/a  Are These Weapons Safely Secured?                            -- (n/a)  Who Could Verify You Are Able To Have These Secured: n/a  Do You Have any Outstanding Charges, Pending Court Dates, Parole/Probation? none reported  Contacted To Inform of Risk of Harm To Self or Others: -- (n/a)    Does Patient Present under Involuntary Commitment? No    [place] of Residence: [place]   Patient Currently Receiving the Following Services: Not Receiving Services   Determination of Need: Urgent (48 hours)   Options For Referral: [facility]gent Care; Medication Management; Inpatient Hospitalization; Facility-Based Crisis     CCA Biopsychosocial Patient Reported Schizophrenia/Schizoaffective Diagnosis in Past: Yes   Strengths: self-awareness   Mental  Health Symptoms Depression:  Sleep (too much or little); Irritability; Difficulty Concentrating; Increase/decrease in appetite; Hopelessness; Fatigue   Duration of Depressive symptoms: Duration of Depressive Symptoms: Greater than two weeks   Mania:  Racing thoughts   Anxiety:   Tension; Restlessness; Worrying; Sleep; Fatigue; Difficulty concentrating   Psychosis:  Delusions; Hallucinations   Duration of Psychotic symptoms: Duration of Psychotic Symptoms: Greater than six months   Trauma:  None (UTA)   Obsessions:  Poor insight   Compulsions:  Poor Insight   Inattention:  Disorganized   Hyperactivity/Impulsivity:  Feeling of restlessness   Oppositional/Defiant Behaviors:  Argumentative   Emotional Irregularity:  Potentially harmful impulsivity   Other Mood/Personality Symptoms:  UTA    Mental Status Exam Appearance and self-care  Stature:  Tall   Weight:  Average weight   Clothing:  Disheveled   Grooming:  Neglected   Cosmetic use:  None   Posture/gait:  Normal   Motor activity:  Restless   Sensorium  Attention:  Confused   Concentration:  Anxiety interferes   Orientation:  Person; Place; Time; Situation; Object   Recall/memory:  Defective in Short-term   Affect and Mood  Affect:  Anxious   Mood:  Anxious; Depressed   Relating  Eye contact:  Normal   Facial expression:  Responsive; Anxious  Attitude toward examiner:  Cooperative   Thought and Language  Speech flow: Slow; Garbled   Thought content:  Delusions   Preoccupation:  Other (Comment) (Delusions.)   Hallucinations:  Other (Comment) (paranoia)   Organization:  Patent examiner of Knowledge:  Poor   Intelligence:  Average   Abstraction:  Functional   Judgement:  Poor; Impaired   Reality Testing:  Distorted   Insight:  Lacking   Decision Making:  Impulsive   Social Functioning  Social Maturity:  Impulsive   Social Judgement:  Chemical engineer";  Heedless   Stress  Stressors:  Other (Comment) (UTA)   Coping Ability:  Deficient supports   Skill Deficits:  Communication; Decision making; Interpersonal; Responsibility; Self-control; Self-care   Supports:  Support needed     Religion: Religion/Spirituality Are You A Religious Person?: Yes How Might This Affect Treatment?: none  Leisure/Recreation: Leisure / Recreation Do You Have Hobbies?: No Leisure and Hobbies: n/a  Exercise/Diet: Exercise/Diet Do You Exercise?:  (UTA) Have You Gained or Lost A Significant Amount of Weight in the Past Six Months?:  (UTA) Do You Follow a Special Diet?:  (UTA) Do You Have Any Trouble Sleeping?: Yes Explanation of Sleeping Difficulties: Pt reports, not getting much sleep.   CCA Employment/Education Employment/Work Situation: Employment / Work Situation Employment Situation: Unemployed Patient's Job has Been Impacted by Current Illness: No Has Patient ever Been in Equities trader?: No  Education: Education Is Patient Currently Attending School?: No Last Grade Completed: 9 (UTA) Did You Attend College?:  (UTA) Did You Have An Individualized Education Program (IIEP):  (UTA) Did You Have Any Difficulty At School?:  (UTA) Patient's Education Has Been Impacted by Current Illness: No   CCA Family/Childhood History Family and Relationship History: Family history Marital status: Single Does patient have children?: No  Childhood History:  Childhood History By whom was/is the patient raised?: Other (Comment) Did patient suffer any verbal/emotional/physical/sexual abuse as a child?: No Did patient suffer from severe childhood neglect?: No Has patient ever been sexually abused/assaulted/raped as an adolescent or adult?: No Was the patient ever a victim of a crime or a disaster?: No Witnessed domestic violence?: No Has patient been affected by domestic violence as an adult?: No       CCA Substance Use Alcohol/Drug Use: Alcohol  / Drug Use Pain Medications: See MAR Prescriptions: See MAR Over the Counter: See MAR History of alcohol / drug use?:  (UTA) Longest period of sobriety (when/how long): UTA Negative Consequences of Use:  (UTA) Withdrawal Symptoms:  (UTA)                         ASAM's:  Six Dimensions of Multidimensional Assessment  Dimension 1:  Acute Intoxication and/or Withdrawal Potential:   Dimension 1:  Description of individual's past and current experiences of substance use and withdrawal: uta  Dimension 2:  Biomedical Conditions and Complications:   Dimension 2:  Description of patient's biomedical conditions and  complications: uta  Dimension 3:  Emotional, Behavioral, or Cognitive Conditions and Complications:  Dimension 3:  Description of emotional, behavioral, or cognitive conditions and complications: uta  Dimension 4:  Readiness to Change:  Dimension 4:  Description of Readiness to Change criteria: uta  Dimension 5:  Relapse, Continued use, or Continued Problem Potential:  Dimension 5:  Relapse, continued use, or continued problem potential critiera description: uta  Dimension 6:  Recovery/Living Environment:  Dimension 6:  Recovery/Iiving environment criteria  description: uta  ASAM Severity Score:    ASAM Recommended Level of Treatment: ASAM Recommended Level of Treatment:  Ambrose Bailer)   Substance use Disorder (SUD) Substance Use Disorder (SUD)  Checklist Symptoms of Substance Use:  (uta)  Recommendations for Services/Supports/Treatments: Recommendations for Services/Supports/Treatments Recommendations For Services/Supports/Treatments: Inpatient Hospitalization, Individual Therapy, Medication Management  Disposition Recommendation per psychiatric provider:  Recommends continuous observation.  DSM5 Diagnoses: Patient Active Problem List   Diagnosis Date Noted   Chronic paranoid schizophrenia (HCC) 10/31/2023   Passive suicidal ideations 05/05/2023   Psychosis (HCC)  05/05/2023   Polysubstance abuse (HCC) 05/05/2023     Referrals to Alternative Service(s): Referred to Alternative Service(s):   Place:   Date:   Time:    Referred to Alternative Service(s):   Place:   Date:   Time:    Referred to Alternative Service(s):   Place:   Date:   Time:    Referred to Alternative Service(s):   Place:   Date:   Time:     Adelfa Adolph, [facility]

## 2023-11-22 ENCOUNTER — Other Ambulatory Visit (HOSPITAL_COMMUNITY): Payer: Self-pay

## 2023-11-25 ENCOUNTER — Encounter (HOSPITAL_COMMUNITY): Payer: Self-pay

## 2023-11-25 ENCOUNTER — Other Ambulatory Visit: Payer: Self-pay

## 2023-11-25 ENCOUNTER — Emergency Department (HOSPITAL_COMMUNITY)
Admission: EM | Admit: 2023-11-25 | Discharge: 2023-11-27 | Disposition: A | Discharge status: Other Acute Inpatient Hospital | Attending: Emergency Medicine | Admitting: Emergency Medicine

## 2023-11-25 ENCOUNTER — Ambulatory Visit (HOSPITAL_COMMUNITY)
Admission: EM | Admit: 2023-11-25 | Discharge: 2023-11-25 | Discharge status: Against Medical Advice | Attending: Nurse Practitioner | Admitting: Nurse Practitioner

## 2023-11-25 DIAGNOSIS — Z5900 Homelessness unspecified: Secondary | ICD-10-CM | POA: Insufficient documentation

## 2023-11-25 DIAGNOSIS — F259 Schizoaffective disorder, unspecified: Secondary | ICD-10-CM | POA: Insufficient documentation

## 2023-11-25 DIAGNOSIS — Z79899 Other long term (current) drug therapy: Secondary | ICD-10-CM | POA: Diagnosis not present

## 2023-11-25 DIAGNOSIS — F22 Delusional disorders: Secondary | ICD-10-CM | POA: Insufficient documentation

## 2023-11-25 DIAGNOSIS — F200 Paranoid schizophrenia: Secondary | ICD-10-CM

## 2023-11-25 DIAGNOSIS — R45851 Suicidal ideations: Secondary | ICD-10-CM | POA: Insufficient documentation

## 2023-11-25 DIAGNOSIS — F29 Unspecified psychosis not due to a substance or known physiological condition: Secondary | ICD-10-CM | POA: Diagnosis present

## 2023-11-25 DIAGNOSIS — F1910 Other psychoactive substance abuse, uncomplicated: Secondary | ICD-10-CM | POA: Diagnosis present

## 2023-11-25 DIAGNOSIS — Z5329 Procedure and treatment not carried out because of patient's decision for other reasons: Secondary | ICD-10-CM | POA: Insufficient documentation

## 2023-11-25 LAB — COMPREHENSIVE METABOLIC PANEL WITH GFR
ALT: 26 U/L (ref 0–44)
AST: 40 U/L (ref 15–41)
Albumin: 3.9 g/dL (ref 3.5–5.0)
Alkaline Phosphatase: 69 U/L (ref 38–126)
Anion gap: 9 (ref 5–15)
BUN: 26 mg/dL — ABNORMAL HIGH (ref 6–20)
CO2: 24 mmol/L (ref 22–32)
Calcium: 9.1 mg/dL (ref 8.9–10.3)
Chloride: 105 mmol/L (ref 98–111)
Creatinine, Ser: 1.38 mg/dL — ABNORMAL HIGH (ref 0.61–1.24)
GFR, Estimated: >60 mL/min (ref >60)
Glucose, Bld: 107 mg/dL — ABNORMAL HIGH (ref 70–99)
Potassium: 3.8 mmol/L (ref 3.5–5.1)
Sodium: 138 mmol/L (ref 135–145)
Total Bilirubin: 0.4 mg/dL (ref 0.0–1.2)
Total Protein: 7.1 g/dL (ref 6.5–8.1)

## 2023-11-25 LAB — CBC
HCT: 42.4 % (ref 39.0–52.0)
Hemoglobin: 13.3 g/dL (ref 13.0–17.0)
MCH: 26.6 pg (ref 26.0–34.0)
MCHC: 31.4 g/dL (ref 30.0–36.0)
MCV: 84.8 fL (ref 80.0–100.0)
Platelets: 275 10*3/uL (ref 150–400)
RBC: 5 MIL/uL (ref 4.22–5.81)
RDW: 13.4 % (ref 11.5–15.5)
WBC: 10.5 10*3/uL (ref 4.0–10.5)
nRBC: 0 % (ref 0.0–0.2)

## 2023-11-25 LAB — ETHANOL: Alcohol, Ethyl (B): <15 mg/dL (ref <15)

## 2023-11-25 MED ORDER — HYDROXYZINE HCL 25 MG PO TABS
25.0000 mg | ORAL_TABLET | Freq: Three times a day (TID) | ORAL | Status: DC | PRN
Start: 2023-11-25 — End: 2023-11-27

## 2023-11-25 MED ORDER — ACETAMINOPHEN 325 MG PO TABS
650.0000 mg | ORAL_TABLET | Freq: Four times a day (QID) | ORAL | Status: DC | PRN
Start: 2023-11-25 — End: 2023-11-25

## 2023-11-25 MED ORDER — HYDROXYZINE HCL 25 MG PO TABS: 25.0000 mg | ORAL_TABLET | Freq: Three times a day (TID) | ORAL | Status: DC | PRN

## 2023-11-25 MED ORDER — ONDANSETRON HCL 4 MG PO TABS: 4.0000 mg | ORAL_TABLET | Freq: Three times a day (TID) | ORAL | Status: DC | PRN

## 2023-11-25 MED ORDER — ALUM & MAG HYDROXIDE-SIMETH 200-200-20 MG/5ML PO SUSP: 30.0000 mL | ORAL | Status: DC | PRN

## 2023-11-25 MED ORDER — LORAZEPAM 2 MG/ML IJ SOLN: 2.0000 mg | Freq: Three times a day (TID) | INTRAMUSCULAR | Status: DC | PRN

## 2023-11-25 MED ORDER — TRAZODONE HCL 50 MG PO TABS: 50.0000 mg | ORAL_TABLET | Freq: Every evening | ORAL | Status: DC | PRN

## 2023-11-25 MED ORDER — GABAPENTIN 300 MG PO CAPS
300.0000 mg | ORAL_CAPSULE | Freq: Three times a day (TID) | ORAL | Status: DC
Start: 2023-11-25 — End: 2023-11-27
  Administered 2023-11-25 – 2023-11-27 (×4): 300 mg via ORAL
  Filled 2023-11-25 (×3): qty 1
  Filled 2023-11-25 (×2): qty 3

## 2023-11-25 MED ORDER — DIPHENHYDRAMINE HCL 50 MG/ML IJ SOLN: 50.0000 mg | Freq: Three times a day (TID) | INTRAMUSCULAR | Status: DC | PRN

## 2023-11-25 MED ORDER — ACETAMINOPHEN 325 MG PO TABS: 650.0000 mg | ORAL_TABLET | ORAL | Status: DC | PRN

## 2023-11-25 MED ORDER — RISPERIDONE 0.5 MG PO TBDP: 2.0000 mg | ORAL_TABLET | Freq: Three times a day (TID) | ORAL | Status: DC | PRN

## 2023-11-25 MED ORDER — ZIPRASIDONE MESYLATE 20 MG IM SOLR: 20.0000 mg | INTRAMUSCULAR | Status: DC | PRN

## 2023-11-25 MED ORDER — OLANZAPINE 5 MG PO TABS
5.0000 mg | ORAL_TABLET | ORAL | Status: AC
  Administered 2023-11-25: 5 mg via ORAL
  Filled 2023-11-25: qty 1

## 2023-11-25 MED ORDER — LORAZEPAM 1 MG PO TABS: 1.0000 mg | ORAL_TABLET | ORAL | Status: DC | PRN

## 2023-11-25 MED ORDER — HALOPERIDOL LACTATE 5 MG/ML IJ SOLN: 10.0000 mg | Freq: Three times a day (TID) | INTRAMUSCULAR | Status: DC | PRN

## 2023-11-25 MED ORDER — HALOPERIDOL 5 MG PO TABS: 5.0000 mg | ORAL_TABLET | Freq: Three times a day (TID) | ORAL | Status: DC | PRN

## 2023-11-25 MED ORDER — DIPHENHYDRAMINE HCL 50 MG PO CAPS: 50.0000 mg | ORAL_CAPSULE | Freq: Three times a day (TID) | ORAL | Status: DC | PRN

## 2023-11-25 MED ORDER — CARMEX CLASSIC LIP BALM EX OINT
TOPICAL_OINTMENT | CUTANEOUS | Status: DC | PRN
Start: 2023-11-25 — End: 2023-11-27

## 2023-11-25 MED ORDER — OLANZAPINE 10 MG PO TBDP
10.0000 mg | ORAL_TABLET | Freq: Two times a day (BID) | ORAL | Status: DC
  Administered 2023-11-25 – 2023-11-27 (×4): 10 mg via ORAL
  Filled 2023-11-25 (×4): qty 1

## 2023-11-25 MED ORDER — MAGNESIUM HYDROXIDE 400 MG/5ML PO SUSP: 30.0000 mL | Freq: Every day | ORAL | Status: DC | PRN

## 2023-11-25 MED ORDER — ZOLPIDEM TARTRATE 5 MG PO TABS
5.0000 mg | ORAL_TABLET | Freq: Every evening | ORAL | Status: DC | PRN
  Administered 2023-11-25: 5 mg via ORAL
  Filled 2023-11-25 (×2): qty 1

## 2023-11-25 MED ORDER — HALOPERIDOL LACTATE 5 MG/ML IJ SOLN: 5.0000 mg | Freq: Three times a day (TID) | INTRAMUSCULAR | Status: DC | PRN

## 2023-11-25 NOTE — ED Notes (Signed)
 Patient refused labs and EKG, verbally aggressive and abusive toward nursing and security staff. Patient said "I don't want to be taken care off by foreigners and a girl." And demanded for apple juice. Patient decided to leave against medical advice

## 2023-11-25 NOTE — ED Notes (Signed)
 Pt has continual conversation with self

## 2023-11-25 NOTE — ED Provider Notes (Signed)
 [place] EMERGENCY DEPARTMENT AT [facility] Provider Note   CSN: [id number] Arrival date & time: 11/25/23  1623     History  Chief Complaint  Patient presents with   Suicidal    Aaron Rubio is a 43 y.o. male.  HPI    43 year old male patient with a past psychiatric history significant for paranoid schizophrenia and polysubstance abuse presents to the ER with chief complaint of suicidal ideation.  Level 5 caveat for psychosis.  Patient states that he has suicidal thoughts.  He gives me a blank stare with any other questioning.  Patient was noted to be talking to himself when I walked to assess him.  Patient was recently admitted to [facility] for psychosis.  He was disorganized during that presentation as well.  Home Medications Prior to Admission medications   Medication Sig Start Date End Date Taking? Authorizing Provider  amLODipine  (NORVASC ) 10 MG tablet Take 1 tablet (10 mg total) by mouth daily. 11/12/23   Zouev, Dmitri, MD  cyclobenzaprine  (FLEXERIL ) 10 MG tablet Take 0.5-1 tablets (5-10 mg total) by mouth at bedtime as needed for muscle spasms. Patient not taking: Reported on 11/15/2023 11/02/23   Rexie Catena, PA-C  gabapentin  (NEURONTIN ) 300 MG capsule Take 1 capsule (300 mg total) by mouth 3 (three) times daily. 11/11/23   Zouev, Dmitri, MD  hydrOXYzine  (ATARAX ) 25 MG tablet Take 1 tablet (25 mg total) by mouth 3 (three) times daily as needed for anxiety. 11/11/23   Zouev, Dmitri, MD  OLANZapine  (ZYPREXA ) 10 MG tablet Take 1 tablet (10 mg) in the morning daily and 2 tablets (20 mg) every night 11/12/23   Zouev, Dmitri, MD  traZODone  (DESYREL ) 50 MG tablet Take 1 tablet (50 mg total) by mouth at bedtime as needed for sleep. 11/11/23   Zouev, Dmitri, MD      Allergies    Patient has no known allergies.    Review of Systems   Review of Systems  All other systems reviewed and are negative.   Physical Exam Updated Vital Signs BP 114/78 (BP  Location: Left Arm)   Pulse (!) 106   Temp 98.6 F (37 C) (Oral)   Resp 16   SpO2 100%  Physical Exam Vitals and nursing note reviewed.  Constitutional:      Appearance: He is well-developed.  HENT:     Head: Atraumatic.  Eyes:     Extraocular Movements: Extraocular movements intact.  Cardiovascular:     Rate and Rhythm: Normal rate.  Pulmonary:     Effort: Pulmonary effort is normal.  Musculoskeletal:     Cervical back: Neck supple.  Skin:    General: Skin is warm.  Neurological:     Mental Status: He is alert and oriented to person, place, and time.  Psychiatric:        Attention and Perception: He is inattentive. He perceives auditory hallucinations.        Speech: Speech is delayed and tangential.        Behavior: Behavior is slowed.        Thought Content: Thought content includes suicidal ideation.     ED Results / Procedures / Treatments   Labs (all labs ordered are listed, but only abnormal results are displayed) Labs Reviewed  CBC  COMPREHENSIVE METABOLIC PANEL WITH GFR  ETHANOL  RAPID URINE DRUG SCREEN, HOSP PERFORMED    EKG None  Radiology No results found.  Procedures Procedures    Medications Ordered in ED Medications  risperiDONE (RISPERDAL M-TABS) disintegrating tablet 2 mg (has no administration in time range)    And  LORazepam  (ATIVAN ) tablet 1 mg (has no administration in time range)    And  ziprasidone (GEODON) injection 20 mg (has no administration in time range)  acetaminophen  (TYLENOL ) tablet 650 mg (has no administration in time range)  ondansetron  (ZOFRAN ) tablet 4 mg (has no administration in time range)  zolpidem (AMBIEN) tablet 5 mg (has no administration in time range)    ED Course/ Medical Decision Making/ A&P                                 Medical Decision Making Amount and/or Complexity of Data Reviewed Labs: ordered.  Risk OTC drugs. Prescription drug management.   Patient comes to the ER with cc of suicidal  ideation. Patient has pertinent past medical history of polysubstance use disorder, psychosis Currently patient is disorganized, he is responding to internal stimuli and mentions SI, without elaborating on plan.  Patient not really cooperative with history.  Patient recently was admitted to the hospital for psychosis.  It appears that he had gone to [facility] and signed out AMA from there.  Pt denies nausea, emesis, fevers, chills, chest pains, shortness of breath, headaches, abdominal pain, uti like symptoms and patient has no active medical complaints. I have reviewed previous encounters for this patient and reviewed their primary medications.  Differential diagnosis considered for this patient includes: Depression Bipolar disorder Schizophrenia Substance abuse Suicidal ideation Acute withdrawal  Appropriate labs have been ordered. Patient is medically cleared for psychiatric evaluation.    Final Clinical Impression(s) / ED Diagnoses Final diagnoses:  Schizoaffective disorder, unspecified type [facility])    Rx / DC Orders ED Discharge Orders     None         Deatra Face, MD 11/25/23 1732

## 2023-11-25 NOTE — ED Provider Notes (Signed)
 [facility] Continuous Assessment Admission H&P  Date: 11/25/23 Patient Name: Aaron Rubio MRN: [id number] Chief Complaint: psych evaluation  Diagnoses:  Final diagnoses:  Paranoid schizophrenia [facility])    HPI: Aaron Rubio is a 43 y/o male with a psychiatric history of schizophrenia, suicidal ideation, depression, polysubstance abuse presented to [facility] voluntarily.   NP assessed patient face to face and reviewed his chart. Patient was recently admitted to [facility] inpatient psychiatric facility on 11/15/23.  Patient is alert, but is very irritable and angry and refuses to answer any questions.  Patient states that I know why he is here and that the reason is in the computer. Patient then stated that the [facility] sent him and then asks if I am with the [employer]. Patient denies any SI/HI or AVH. Patient then stated that he was not going to answer anymore questions. Patient initially agreed to be admitted to the observation unit. When nursing staff attempted to start the admission process patient refused and demanded to leave.  Patient was escorted out by security.  Patient does not meet the criteria for involuntary commitment at this time.   Total Time spent with patient: 15 minutes  Musculoskeletal  Strength & Muscle Tone: within normal limits Gait & Station: normal Patient leans: N/A  Psychiatric Specialty Exam  Presentation General Appearance:  Casual  Eye Contact: Good  Speech: Pressured  Speech Volume: Increased  Handedness: Right   Mood and Affect  Mood: Angry; Irritable  Affect: Congruent   Thought Process  Thought Processes: Disorganized  Descriptions of Associations:Circumstantial  Orientation:Partial  Thought Content:Paranoid Ideation; Scattered; Illogical  Diagnosis of Schizophrenia or Schizoaffective disorder in past: Yes  Duration of Psychotic Symptoms: Greater than six months  Hallucinations:Hallucinations: Other (comment) (refused to  answer)  Ideas of Reference:Delusions  Suicidal Thoughts:Suicidal Thoughts: -- (refused to answer)  Homicidal Thoughts:Homicidal Thoughts: -- (refused to answer)   Sensorium  Memory: -- Aaron Rubio)  Judgment: Poor  Insight: Lacking   Executive Functions  Concentration: Poor  Attention Span: Poor  Recall: Poor  Fund of Knowledge: Poor  Language: Poor   Psychomotor Activity  Psychomotor Activity: Psychomotor Activity: Restlessness   Assets  Assets: Physical Health   Sleep  Sleep: Sleep: -- (unable to assess) Number of Hours of Sleep: 0 (unable to assess)   Nutritional Assessment (For OBS and FBC admissions only) Has the patient had a weight loss or gain of 10 pounds or more in the last 3 months?: -- (unable to assess) Has the patient had a decrease in food intake/or appetite?: -- (unable to assess) Does the patient have dental problems?: -- (unable to assess) Does the patient have eating habits or behaviors that may be indicators of an eating disorder including binging or inducing vomiting?: -- (unable to assess) Has the patient recently lost weight without trying?: -- (unable to assess) Has the patient been eating poorly because of a decreased appetite?: -- (unable to assess)    Physical Exam HENT:     Head: Normocephalic.     Nose: Nose normal.  Eyes:     Pupils: Pupils are equal, round, and reactive to light.  Cardiovascular:     Rate and Rhythm: Normal rate.  Pulmonary:     Effort: Pulmonary effort is normal.  Musculoskeletal:        General: Normal range of motion.  Neurological:     Mental Status: He is alert.  Psychiatric:        Attention and Perception: He is inattentive.  Mood and Affect: Mood is anxious.        Speech: Speech is tangential.        Behavior: Behavior is aggressive.        Thought Content: Thought content does not include suicidal ideation. Thought content does not include suicidal plan.        Cognition and  Memory: Cognition normal.        Judgment: Judgment is impulsive.    Review of Systems  Constitutional: Negative.   HENT: Negative.    Respiratory: Negative.    Cardiovascular: Negative.   Genitourinary: Negative.   Musculoskeletal: Negative.   Skin: Negative.   Neurological: Negative.   Endo/Heme/Allergies: Negative.   Psychiatric/Behavioral:  The patient is nervous/anxious.     Blood pressure (!) 130/92, pulse 76, temperature (!) 97.5 F (36.4 C), temperature source Oral, resp. rate 16, SpO2 100%. There is no height or weight on file to calculate BMI.  Past Psychiatric History: schizophrenia  Is the patient at risk to self? No  Has the patient been a risk to self in the past 6 months? No .    Has the patient been a risk to self within the distant past? No   Is the patient a risk to others? No   Has the patient been a risk to others in the past 6 months? No   Has the patient been a risk to others within the distant past? No   Past Medical History: unknown  Family History:  unknown  Social History: homeless  Last Labs:  Admission on 11/02/2023, Discharged on 11/02/2023  Component Date Value Ref Range Status   Sodium 11/02/2023 137  135 - 145 mmol/L Final   Potassium 11/02/2023 3.6  3.5 - 5.1 mmol/L Final   Chloride 11/02/2023 101  98 - 111 mmol/L Final   CO2 11/02/2023 25  22 - 32 mmol/L Final   Glucose, Bld 11/02/2023 75  70 - 99 mg/dL Final   Glucose reference range applies only to samples taken after fasting for at least 8 hours.   BUN 11/02/2023 28 (H)  6 - 20 mg/dL Final   Creatinine, Ser 11/02/2023 1.21  0.61 - 1.24 mg/dL Final   Calcium 24/40/1027 9.4  8.9 - 10.3 mg/dL Final   Total Protein 25/36/6440 7.3  6.5 - 8.1 g/dL Final   Albumin 34/74/2595 4.2  3.5 - 5.0 g/dL Final   AST 63/87/5643 51 (H)  15 - 41 U/L Final   ALT 11/02/2023 31  0 - 44 U/L Final   Alkaline Phosphatase 11/02/2023 52  38 - 126 U/L Final   Total Bilirubin 11/02/2023 1.2  0.0 - 1.2 mg/dL  Final   GFR, Estimated 11/02/2023 >60  >60 mL/min Final   Comment: (NOTE) Calculated using the CKD-EPI Creatinine Equation (2021)    Anion gap 11/02/2023 11  5 - 15 Final   Performed at [facility], [address] [place], [place] [address]   Alcohol, Ethyl (B) 11/02/2023 <15  <15 mg/dL Final   Comment: Please note change in reference range. (NOTE) For medical purposes only. Performed at [facility], [address] [place], [place] [address]    Opiates 11/02/2023 NONE DETECTED  NONE DETECTED Final   Cocaine 11/02/2023 POSITIVE (A)  NONE DETECTED Final   Benzodiazepines 11/02/2023 NONE DETECTED  NONE DETECTED Final   Amphetamines 11/02/2023 NONE DETECTED  NONE DETECTED Final   Tetrahydrocannabinol 11/02/2023 POSITIVE (A)  NONE DETECTED Final   Barbiturates 11/02/2023  NONE DETECTED  NONE DETECTED Final   Comment: (NOTE) DRUG SCREEN FOR MEDICAL PURPOSES ONLY.  IF CONFIRMATION IS NEEDED FOR ANY PURPOSE, NOTIFY LAB WITHIN 5 DAYS.  LOWEST DETECTABLE LIMITS FOR URINE DRUG SCREEN Drug Class                     Cutoff (ng/mL) Amphetamine and metabolites    1000 Barbiturate and metabolites    200 Benzodiazepine                 200 Opiates and metabolites        300 Cocaine and metabolites        300 THC                            50 Performed at [facility], [address] [place], [place] [address]    WBC 11/02/2023 10.6 (H)  4.0 - 10.5 K/uL Final   RBC 11/02/2023 5.08  4.22 - 5.81 MIL/uL Final   Hemoglobin 11/02/2023 13.5  13.0 - 17.0 g/dL Final   HCT 60/45/4098 42.6  39.0 - 52.0 % Final   MCV 11/02/2023 83.9  80.0 - 100.0 fL Final   MCH 11/02/2023 26.6  26.0 - 34.0 pg Final   MCHC 11/02/2023 31.7  30.0 - 36.0 g/dL Final   RDW 11/91/4782 13.5  11.5 - 15.5 % Final   Platelets 11/02/2023 203  150 - 400 K/uL Final   nRBC 11/02/2023 0.0  0.0 - 0.2 % Final   Neutrophils Relative % 11/02/2023 59  % Final   Neutro Abs  11/02/2023 6.3  1.7 - 7.7 K/uL Final   Lymphocytes Relative 11/02/2023 20  % Final   Lymphs Abs 11/02/2023 2.1  0.7 - 4.0 K/uL Final   Monocytes Relative 11/02/2023 13  % Final   Monocytes Absolute 11/02/2023 1.4 (H)  0.1 - 1.0 K/uL Final   Eosinophils Relative 11/02/2023 6  % Final   Eosinophils Absolute 11/02/2023 0.7 (H)  0.0 - 0.5 K/uL Final   Basophils Relative 11/02/2023 1  % Final   Basophils Absolute 11/02/2023 0.1  0.0 - 0.1 K/uL Final   Immature Granulocytes 11/02/2023 1  % Final   Abs Immature Granulocytes 11/02/2023 0.07  0.00 - 0.07 K/uL Final   Performed at [facility], [address], [place], [place] [address]  Admission on 10/25/2023, Discharged on 10/31/2023  Component Date Value Ref Range Status   Sodium 10/27/2023 140  135 - 145 mmol/L Final   Potassium 10/27/2023 4.0  3.5 - 5.1 mmol/L Final   Chloride 10/27/2023 108  98 - 111 mmol/L Final   CO2 10/27/2023 23  22 - 32 mmol/L Final   Glucose, Bld 10/27/2023 95  70 - 99 mg/dL Final   Glucose reference range applies only to samples taken after fasting for at least 8 hours.   BUN 10/27/2023 19  6 - 20 mg/dL Final   Creatinine, Ser 10/27/2023 1.14  0.61 - 1.24 mg/dL Final   Calcium 30/86/5784 8.8 (L)  8.9 - 10.3 mg/dL Final   Total Protein 69/62/9528 6.0 (L)  6.5 - 8.1 g/dL Final   Albumin 41/32/4401 3.4 (L)  3.5 - 5.0 g/dL Final   AST 02/72/5366 25  15 - 41 U/L Final   ALT 10/27/2023 27  0 - 44 U/L Final   Alkaline Phosphatase 10/27/2023 61  38 - 126 U/L Final   Total Bilirubin  10/27/2023 0.3  0.0 - 1.2 mg/dL Final   GFR, Estimated 10/27/2023 >60  >60 mL/min Final   Comment: (NOTE) Calculated using the CKD-EPI Creatinine Equation (2021)    Anion gap 10/27/2023 9  5 - 15 Final   Performed at [facility], [address], [place], [place] [address]   TSH 10/27/2023 0.962  0.350 - 4.500 uIU/mL Final   Comment: Performed by a 3rd Generation assay with a functional sensitivity of  <=0.01 uIU/mL. Performed at [facility], [address], [place], [place] [address]    Hgb A1c MFr Bld 10/27/2023 5.0  4.8 - 5.6 % Final   Comment: (NOTE) Pre diabetes:          5.7%-6.4%  Diabetes:              >6.4%  Glycemic control for   <7.0% adults with diabetes    Mean Plasma Glucose 10/27/2023 96.8  mg/dL Final   Performed at [facility], [address] [place], [place] [address]  Admission on 10/24/2023, Discharged on 10/25/2023  Component Date Value Ref Range Status   POC Amphetamine UR 10/24/2023 None Detected  NONE DETECTED (Cut Off Level 1000 ng/mL) Final   POC Secobarbital (BAR) 10/24/2023 None Detected  NONE DETECTED (Cut Off Level 300 ng/mL) Final   POC Buprenorphine (BUP) 10/24/2023 None Detected  NONE DETECTED (Cut Off Level 10 ng/mL) Final   POC Oxazepam (BZO) 10/24/2023 None Detected  NONE DETECTED (Cut Off Level 300 ng/mL) Final   POC Cocaine UR 10/24/2023 Positive (A)  NONE DETECTED (Cut Off Level 300 ng/mL) Final   POC Methamphetamine UR 10/24/2023 None Detected  NONE DETECTED (Cut Off Level 1000 ng/mL) Final   POC Morphine 10/24/2023 None Detected  NONE DETECTED (Cut Off Level 300 ng/mL) Final   POC Methadone UR 10/24/2023 None Detected  NONE DETECTED (Cut Off Level 300 ng/mL) Final   POC Oxycodone UR 10/24/2023 None Detected  NONE DETECTED (Cut Off Level 100 ng/mL) Final   POC Marijuana UR 10/24/2023 None Detected  NONE DETECTED (Cut Off Level 50 ng/mL) Final  Admission on 10/19/2023, Discharged on 10/22/2023  Component Date Value Ref Range Status   WBC 10/19/2023 10.8 (H)  4.0 - 10.5 K/uL Final   RBC 10/19/2023 5.59  4.22 - 5.81 MIL/uL Final   Hemoglobin 10/19/2023 14.6  13.0 - 17.0 g/dL Final   HCT 69/62/9528 46.1  39.0 - 52.0 % Final   MCV 10/19/2023 82.5  80.0 - 100.0 fL Final   MCH 10/19/2023 26.1  26.0 - 34.0 pg Final   MCHC 10/19/2023 31.7  30.0 - 36.0 g/dL Final   RDW 41/32/4401 13.2  11.5 - 15.5 % Final   Platelets  10/19/2023 275  150 - 400 K/uL Final   nRBC 10/19/2023 0.0  0.0 - 0.2 % Final   Neutrophils Relative % 10/19/2023 62  % Final   Neutro Abs 10/19/2023 6.8  1.7 - 7.7 K/uL Final   Lymphocytes Relative 10/19/2023 21  % Final   Lymphs Abs 10/19/2023 2.3  0.7 - 4.0 K/uL Final   Monocytes Relative 10/19/2023 10  % Final   Monocytes Absolute 10/19/2023 1.0  0.1 - 1.0 K/uL Final   Eosinophils Relative 10/19/2023 5  % Final   Eosinophils Absolute 10/19/2023 0.6 (H)  0.0 - 0.5 K/uL Final   Basophils Relative 10/19/2023 1  % Final   Basophils Absolute 10/19/2023 0.1  0.0 - 0.1 K/uL Final   Immature Granulocytes  10/19/2023 1  % Final   Abs Immature Granulocytes 10/19/2023 0.10 (H)  0.00 - 0.07 K/uL Final   Performed at [facility], [address], [place], [place] [address]   Sodium 10/19/2023 140  135 - 145 mmol/L Final   Potassium 10/19/2023 4.8  3.5 - 5.1 mmol/L Final   Chloride 10/19/2023 100  98 - 111 mmol/L Final   CO2 10/19/2023 28  22 - 32 mmol/L Final   Glucose, Bld 10/19/2023 81  70 - 99 mg/dL Final   Glucose reference range applies only to samples taken after fasting for at least 8 hours.   BUN 10/19/2023 15  6 - 20 mg/dL Final   Creatinine, Ser 10/19/2023 1.19  0.61 - 1.24 mg/dL Final   Calcium 30/86/5784 10.6 (H)  8.9 - 10.3 mg/dL Final   Total Protein 69/62/9528 7.0  6.5 - 8.1 g/dL Final   Albumin 41/32/4401 4.3  3.5 - 5.0 g/dL Final   AST 02/72/5366 117 (H)  15 - 41 U/L Final   ALT 10/19/2023 74 (H)  0 - 44 U/L Final   Alkaline Phosphatase 10/19/2023 55  38 - 126 U/L Final   Total Bilirubin 10/19/2023 0.9  0.0 - 1.2 mg/dL Final   GFR, Estimated 10/19/2023 >60  >60 mL/min Final   Comment: (NOTE) Calculated using the CKD-EPI Creatinine Equation (2021)    Anion gap 10/19/2023 12  5 - 15 Final   Performed at [facility], [address], [place], [place] [address]   Cholesterol 10/19/2023 152  0 - 200 mg/dL Final   Triglycerides 74/25/9563 144  <150 mg/dL Final   HDL  87/56/4332 57  >40 mg/dL Final   Total CHOL/HDL Ratio 10/19/2023 2.7  RATIO Final   VLDL 10/19/2023 29  0 - 40 mg/dL Final   LDL Cholesterol 10/19/2023 66  0 - 99 mg/dL Final   Comment:        Total Cholesterol/HDL:CHD Risk Coronary Heart Disease Risk Table                     Men   Women  1/2 Average Risk   3.4   3.3  Average Risk       5.0   4.4  2 X Average Risk   9.6   7.1  3 X Average Risk  23.4   11.0        Use the calculated Patient Ratio above and the CHD Risk Table to determine the patient's CHD Risk.        ATP III CLASSIFICATION (LDL):  <100     mg/dL   Optimal  951-884  mg/dL   Near or Above                    Optimal  130-159  mg/dL   Borderline  166-063  mg/dL   High  >016     mg/dL   Very High Performed at [facility], [address], [place], [place] [address]    Magnesium  10/19/2023 2.2  1.7 - 2.4 mg/dL Final   Performed at [facility], [address] [place], [place] [address]   POC Amphetamine UR 10/20/2023 None Detected  NONE DETECTED (Cut Off Level 1000 ng/mL) Final   POC Secobarbital (BAR) 10/20/2023 None Detected  NONE DETECTED (Cut Off Level 300 ng/mL) Final   POC Buprenorphine (BUP) 10/20/2023 None Detected  NONE DETECTED (Cut Off Level 10 ng/mL) Final  POC Oxazepam (BZO) 10/20/2023 None Detected  NONE DETECTED (Cut Off Level 300 ng/mL) Final   POC Cocaine UR 10/20/2023 None Detected  NONE DETECTED (Cut Off Level 300 ng/mL) Final   POC Methamphetamine UR 10/20/2023 None Detected  NONE DETECTED (Cut Off Level 1000 ng/mL) Final   POC Morphine 10/20/2023 None Detected  NONE DETECTED (Cut Off Level 300 ng/mL) Final   POC Methadone UR 10/20/2023 None Detected  NONE DETECTED (Cut Off Level 300 ng/mL) Final   POC Oxycodone UR 10/20/2023 None Detected  NONE DETECTED (Cut Off Level 100 ng/mL) Final   POC Marijuana UR 10/20/2023 None Detected  NONE DETECTED (Cut Off Level 50 ng/mL) Final   SARS Coronavirus 2 by RT PCR 10/20/2023 NEGATIVE  NEGATIVE  Final   Performed at [facility], [address] [place], [place] [address]    Allergies: Patient has no known allergies.  Medications:  PTA Medications  Medication Sig   cyclobenzaprine  (FLEXERIL ) 10 MG tablet Take 0.5-1 tablets (5-10 mg total) by mouth at bedtime as needed for muscle spasms. (Patient not taking: Reported on 11/15/2023)   amLODipine  (NORVASC ) 10 MG tablet Take 1 tablet (10 mg total) by mouth daily.   hydrOXYzine  (ATARAX ) 25 MG tablet Take 1 tablet (25 mg total) by mouth 3 (three) times daily as needed for anxiety.   OLANZapine  (ZYPREXA ) 10 MG tablet Take 1 tablet (10 mg) in the morning daily and 2 tablets (20 mg) every night   traZODone  (DESYREL ) 50 MG tablet Take 1 tablet (50 mg total) by mouth at bedtime as needed for sleep.   gabapentin  (NEURONTIN ) 300 MG capsule Take 1 capsule (300 mg total) by mouth 3 (three) times daily.   Facility Ordered Medications  Medication   acetaminophen  (TYLENOL ) tablet 650 mg   alum & mag hydroxide-simeth (MAALOX/MYLANTA) 200-200-20 MG/5ML suspension 30 mL   magnesium  hydroxide (MILK OF MAGNESIA) suspension 30 mL   haloperidol  (HALDOL ) tablet 5 mg   And   diphenhydrAMINE  (BENADRYL ) capsule 50 mg   haloperidol  lactate (HALDOL ) injection 5 mg   And   diphenhydrAMINE  (BENADRYL ) injection 50 mg   And   LORazepam  (ATIVAN ) injection 2 mg   haloperidol  lactate (HALDOL ) injection 10 mg   And   diphenhydrAMINE  (BENADRYL ) injection 50 mg   And   LORazepam  (ATIVAN ) injection 2 mg   traZODone  (DESYREL ) tablet 50 mg   hydrOXYzine  (ATARAX ) tablet 25 mg      Medical Decision Making  Kemari Narez is a 43 y/o male with a psychiatric history of schizophrenia, suicidal ideation, depression, polysubstance abuse presented to [facility] voluntarily.     Recommendations  Based on my evaluation the patient does not appear to have an emergency medical condition. Patient demanded to leave and left AMA.  Regina Ganci E Suede [place], NP 11/25/23  7:03  AM

## 2023-11-25 NOTE — ED Triage Notes (Signed)
 Pt reports he has been having thoughts of suicide. Pt does not elaborate on a plan. Pt also c/o pain to bilateral feet, lower back and dental pain.

## 2023-11-25 NOTE — Progress Notes (Addendum)
   11/25/23 0449  [facility] Triage Screening (Walk-ins at [facility] only)  How Did You Hear About Us ? Self  What Is the Reason for Your Visit/Call Today? Aaron Rubio is a 43 year old male presenting as a voluntary walk-in to [facility] with a history of paranoid schizophrenia and polysubstance abuse.Per chart patient is homeless. Patient was uncooperative and did not want to answer questions. Patient stated "They know my problem and why I'm here". Patient appears to be responding to internal stimuli. Unable to assess further due to presentation and hostility.  How Long Has This Been Causing You Problems? > than 6 months  Have You Recently Had Any Thoughts About Hurting Yourself?  (UTA)  How long ago did you have thoughts about hurting yourself? UTA  Are You Planning to Commit Suicide/Harm Yourself At This time?  (UTA)  Have you Recently Had Thoughts About Hurting Someone [name]?  (UTA)  How long ago did you have thoughts of harming others? UTA  Are You Planning To Harm Someone At This Time?  (UTA)  Explanation: UTA  Physical Abuse  (UTA)  Verbal Abuse  (UTA)  Sexual Abuse  (UTA)  Exploitation of patient/patient's resources  (UTA)  Self-Neglect  (UTA)  Possible abuse reported to:  (UTA)  Are you currently experiencing any auditory, visual or other hallucinations?  (UTA)  Please explain the hallucinations you are currently experiencing: UTA  Have You Used Any Alcohol or Drugs in the Past 24 Hours?  (UTA)  What Did You Use and How Much? UTA  Do you have any current medical co-morbidities that require immediate attention?  (UTA)  Clinician description of patient physical appearance/behavior: disorganized, suspicious, appeared to be responding to internal stimuli  What Do You Feel Would Help You the Most Today?  (UTA)  If access to [facility] was not available, would you have sought care in the Emergency Department?  (UTA)  Determination of Need Urgent (48 hours)  Options For Referral [facility]  Urgent Care;Medication Management;Outpatient Therapy  Determination of Need filed? Yes

## 2023-11-25 NOTE — ED Notes (Signed)
 Patient said "I dont wanna sit in the hall way. I need a room. There is shit on the bed. You need to clean that now."

## 2023-11-25 NOTE — Consult Note (Signed)
 [facility] Psychiatric Consult Initial  Patient Name: .Aaron Rubio  MRN: [id number]  DOB: October 21, 1980  Consult Order details:  Orders (From admission, onward)     Start     Ordered   11/25/23 1728  CONSULT TO CALL ACT TEAM       Ordering Provider: Deatra Face, MD  Provider:  (Not yet assigned)  Question:  Reason for Consult?  Answer:  Psych consult   11/25/23 1728             Mode of Visit: In person    Psychiatry Consult Evaluation  Service Date: Nov 25, 2023 LOS:  LOS: 0 days  Chief Complaint "SI"  Primary Psychiatric Diagnoses   Schizophrenia  Homelessness  Delusional  Assessment  Aaron Rubio is a 43 y.o. male admitted: Presented to the ED on 11/25/2023  4:29 PM for delusional disorder, paranoia. He carries the psychiatric diagnoses of schizophrenia and has a past medical history of chronic leg and back pain.    His current presentation of delusions, hallucinations, non compliant with medications is most consistent with decompensating schizophrenia. He meets criteria for inpatient psychiatric admission based on current symptoms.  Current outpatient psychotropic medications include Prozac , Atarax , Seroquel , Trazodone  and historically he has had a positive response to these medications. He was non compliant with medications prior to admission as evidenced by patient response. On initial examination, patient is delusional and responding to internal stimuli. Please see plan below for detailed recommendations.     Diagnoses:  Active Hospital problems: Principal Problem:   Chronic paranoid schizophrenia (HCC) Active Problems:   Polysubstance abuse (HCC)    Plan   ## Psychiatric Medication Recommendations:  Continue patient's home medication   ## Medical Decision Making Capacity: Not specifically addressed in this encounter   ## Further Work-up:  -- No further workup needed at this time EKG or UDS -- most recent EKG on 11/02/2023 had QtC of 442 --  Pertinent labwork reviewed earlier this admission includes: CBC, EKG, UDS     ## Disposition:-- We recommend inpatient psychiatric hospitalization. Patient is under voluntary admission status at this time; please IVC if attempts to leave hospital.   ## Behavioral / Environmental: -To minimize splitting of staff, assign one staff person to communicate all information from the team when feasible. or Utilize compassion and acknowledge the patient's experiences while setting clear and realistic expectations for care.                ## Safety and Observation Level:  - Based on my clinical evaluation, I estimate the patient to be at low risk of self harm in the current setting. - At this time, we recommend  routine. This decision is based on my review of the chart including patient's history and current presentation, interview of the patient, mental status examination, and consideration of suicide risk including evaluating suicidal ideation, plan, intent, suicidal or self-harm behaviors, risk factors, and protective factors. This judgment is based on our ability to directly address suicide risk, implement suicide prevention strategies, and develop a safety plan while the patient is in the clinical setting. Please contact our team if there is a concern that risk level has changed.   CSSR Risk Category:C-SSRS RISK CATEGORY: Low Risk   Suicide Risk Assessment: Patient has following modifiable risk factors for suicide: recklessness and medication noncompliance, which we are addressing by recommended inpatient psychiatric admission. Patient has following non-modifiable or demographic risk factors for suicide: male gender and psychiatric hospitalization Patient  has the following protective factors against suicide: Supportive friends   Thank you for this consult request. Recommendations have been communicated to the primary team.  We will recommend inpatient psychiatric admission and continue to follow patient  at this time.   Chandra Come, PMHNP       History of Present Illness  Relevant Aspects of Hospital ED Course:  Admitted on 11/25/2023 for Admitted on 11/02/2023 for suicidal ideation and hallucinations.    Patient Report:  On evaluation Aaron Rubio presents with rambling speech, and appears to have some thought blocking, with delusions, patient is pleasant upon approach, as he remembers this provider.  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  He then begins to ramble on about foreigners, and prison.  Did not change his subjects and begins talking about how he has a loyal boyfriend to his girlfriend's, stating that a lot of mid out here are achievers but he was never that type of man he was loyal.  This provider asked if he has been using any drugs or alcohol, he denies it states that he feels that there has been going into his lungs and he was spitting up someone else's tooth.  This provider asked patient if he has been compliant with his medications, he states what medications, he states that I need to call new [facility] in [place]  to get his hospital information.  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  Provider asked patient if he come from behavioral health urgent care, patient does not remember what behavioral health urgent care is, asking how many psychiatric hospitals they have here in [place] .  Patient states he is homeless. Patient states that he has not been sleeping, eating and that he does not have any family.   During evaluation Raheel Kunkle is sitting up on his hospital gurney, and appears to be in no acute distress.   He is alert, oriented x  to self and environment, excited, anxious. His mood is euthymic with congruent affect.  He has delayed speech and appears to have thought blocking and is easily distracted. Patient appears to be responding to internal stimuli.    Patient recommended for inpatient treatment, for crisis management, safety and stabilization.    Psych ROS:  Depression: Denies  Anxiety:  Endorses Mania (lifetime and current): Denies Psychosis: (lifetime and current): Yes   Collateral information:  Contacted None: The patient he has no family or no one to contact    Review of Systems  Psychiatric/Behavioral:  Positive for hallucinations, substance abuse and suicidal ideas.      Psychiatric and Social History  Psychiatric History:  Information collected from chart review   Prev Dx/Sx: Schizophrenia Current Psych Provider: None Home Meds (current): See above Previous Med Trials: Yes Therapy: None   Prior Psych Hospitalization: Yes Prior Self Harm: Unknown Prior Violence: Yes   Family Psych History: Unknown Family Hx suicide: Unknown   Social History:  Developmental Hx: Deferred Educational Hx: Unknown Occupational Hx: Unemployed Legal Hx: Unknown Living Situation: Homeless Spiritual Hx: Yes Access to weapons/lethal means: Denies   Substance History Alcohol: Unknown  Tobacco: Yes Illicit drugs: Yes Prescription drug abuse: Unknown Rehab hx: Unknown  Exam Findings  Physical Exam:  Vital Signs:  Temp:  [97.5 F (36.4 C)-98.6 F (37 C)] 98.6 F (37 C) (05/25 1634) Pulse Rate:  [76-106] 106 (05/25 1634) Resp:  [16] 16 (05/25 1634) BP: (114-130)/(78-92) 114/78 (05/25 1634) SpO2:  [100 %] 100 % (05/25 1634) Blood pressure 114/78, pulse (!) 106, temperature 98.6 F (37 C), temperature source Oral, resp. rate 16, SpO2 100%. There is no height or weight on file to calculate BMI.  Physical Exam Vitals and nursing note reviewed. Exam conducted with a chaperone  present.  Neurological:     Mental Status: He is alert.  Psychiatric:        Attention and Perception: He is inattentive. He perceives auditory hallucinations.        Mood and Affect: Mood is anxious.        Speech: Speech is rapid and pressured.        Behavior: Behavior is cooperative.        Thought Content: Thought content is paranoid and delusional.        Cognition and Memory: Cognition is impaired.        Judgment: Judgment is impulsive.     Mental Status Exam: General Appearance: Bizarre  Orientation:  Other:  self and environment   Memory:  Immediate;   Poor Remote;   Poor  Concentration:  Concentration: Poor and Attention Span: Poor  Recall:  Poor  Attention  Poor  Eye Contact:  Fair  Speech:  Garbled and Pressured  Language:  Fair  Volume:  Normal  Mood: anxious  Affect:  Full Range  Thought Process:  Delusional   Thought Content:  Hallucinations: Auditory and Paranoid Ideation  Suicidal Thoughts:  No  Homicidal Thoughts:  No  Judgement:  Impaired  Insight:  Lacking  Psychomotor Activity:  Normal  Akathisia:  NA  Fund of Knowledge:  NA    Assets:  Presenter, broadcasting Social Support  Cognition:  Impaired,  Moderate  ADL's:  Impaired  AIMS (if indicated):        Other History   These have been pulled in through the EMR, reviewed, and updated if appropriate.  Family History:  The patient's family history is not on file.  Medical History: Past Medical History:  Diagnosis Date  . Hypertension   . Schizophrenia [facility])     Surgical History: History reviewed. No pertinent surgical history.   Medications:   Current Facility-Administered Medications:  .  acetaminophen  (TYLENOL ) tablet 650 mg, 650 mg, Oral, Q4H PRN, Nanavati, Ankit, MD .  risperiDONE (RISPERDAL M-TABS) disintegrating tablet 2 mg, 2 mg, Oral, Q8H PRN **AND** LORazepam  (ATIVAN ) tablet 1 mg, 1 mg, Oral, PRN **AND** ziprasidone (GEODON) injection 20 mg, 20 mg, Intramuscular,  PRN, Nanavati, Ankit, MD .  ondansetron  (ZOFRAN ) tablet 4 mg, 4 mg, Oral, Q8H PRN, Nanavati, Ankit, MD .  zolpidem (AMBIEN) tablet 5 mg, 5 mg, Oral, QHS PRN, Nanavati, Ankit, MD  Current Outpatient Medications:  .  amLODipine  (NORVASC ) 10 MG tablet, Take 1 tablet (10 mg total) by mouth daily., Disp: 30 tablet, Rfl: 0 .  cyclobenzaprine  (FLEXERIL ) 10 MG tablet, Take 0.5-1 tablets (5-10 mg total) by mouth at bedtime as needed for muscle spasms. (Patient not taking: Reported on 11/15/2023), Disp: 7 tablet, Rfl: 0 .  gabapentin  (NEURONTIN ) 300 MG capsule, Take 1 capsule (300 mg total) by mouth 3 (three) times daily., Disp: 90 capsule, Rfl: 0 .  hydrOXYzine  (ATARAX ) 25 MG tablet, Take 1 tablet (25 mg total) by mouth 3 (three) times daily as needed for anxiety., Disp: 60 tablet, Rfl: 0 .  OLANZapine  (ZYPREXA ) 10 MG tablet, Take 1 tablet (10 mg) in the morning daily and 2 tablets (20 mg) every night, Disp: 90 tablet, Rfl: 0 .  traZODone  (DESYREL ) 50 MG tablet, Take 1 tablet (50 mg total) by mouth at bedtime as needed for sleep., Disp: 30 tablet, Rfl: 0  Allergies: No Known Allergies  Arelly Whittenberg [name], PMHNP

## 2023-11-26 LAB — RAPID URINE DRUG SCREEN, HOSP PERFORMED
Amphetamines: NOT DETECTED
Barbiturates: NOT DETECTED
Benzodiazepines: NOT DETECTED
Cocaine: POSITIVE — AB
Opiates: NOT DETECTED
Tetrahydrocannabinol: NOT DETECTED

## 2023-11-26 NOTE — ED Notes (Signed)
 Pt ate a snack during snack time

## 2023-11-26 NOTE — ED Notes (Signed)
 1x pt belongings bag placed in [place] D locker

## 2023-11-26 NOTE — ED Notes (Signed)
 Pt is tearful and having quiet conversions with self.

## 2023-11-26 NOTE — Progress Notes (Signed)
 LCSW Progress Note  [id number]   Ran Tullis  11/26/2023  12:55 PM  Description:   Inpatient Psychiatric Referral  Patient was recommended inpatient per Sela Daft, NP. There are no available beds at [facility], per [facility] Bevin Bucks, RN. Patient was referred to the following out of network facilities:   Destination  Service Provider Address Phone Fax  [facility] [address] [place] [address] [phone]  [facility] [address] [place], [place] [address] [phone]  [facility] [address] [place] [address] [phone]  [facility] [address] [place] [address] [phone]  [facility] [address], [place] [address] [phone]  [facility] [address] [place] [address] [phone]  [facility] EFAX [address] [place], [place] [phone]  [facility] [address] [place] [address] [phone]  [facility] [address] [place] [address] [phone]     Situation ongoing, CSW to continue following and update chart as more information becomes available.   [place] Isola Mehlman LCSW-A   11/26/2023 12:57 PM

## 2023-11-26 NOTE — ED Notes (Signed)
 This pt came to the window and ask what he could have to drink and I advised him he can have water because it is in his chart he had snacks out in hall D.  He was shown the rules and he did not care about our rules. I advised him again he can have water.  Then he started arguing with me and all the other pts in this area were at the window by then and I sent all 4 of them to their rooms.

## 2023-11-26 NOTE — ED Notes (Signed)
 Pt provided urine sample, walked to and from bathroom w/o difficulty, stayed in bathroom for ~15 minutes, continuously muttering to himself, cursing and shouting intermittently but not acting aggressively toward others. Pt had breakfast, snack and some juice.

## 2023-11-26 NOTE — ED Notes (Signed)
 Called Sheriff for transposrt, left message. Aaron Rubio

## 2023-11-26 NOTE — Progress Notes (Addendum)
 Pt has been accepted to [facility] on 11/26/2023. Bed assignment: Main campus: Adult   Pt meets inpatient criteria per Sela Daft, NP  Attending Physician will be Lavona Pounds, MD  Report can be called to: [phone]   Pt can arrive after ASAP  Care Team Notified: Majorie Scrape, RN, Arvell Latin, NP, Patt Boozer   [place] Demarcus Thielke LCSW-A   11/26/2023 1:20 PM

## 2023-11-26 NOTE — Consult Note (Signed)
 [facility] Psychiatric Consult Initial  Patient Name: .Aaron Rubio  MRN: [id number]  DOB: 09-Dec-1980  Consult Order details:  Orders (From admission, onward)     Start     Ordered   11/25/23 1728  CONSULT TO CALL ACT TEAM       Ordering Provider: Deatra Face, MD  Provider:  (Not yet assigned)  Question:  Reason for Consult?  Answer:  Psych consult   11/25/23 1728             Mode of Visit: In person    Psychiatry Consult Evaluation  Service Date: Nov 26, 2023 LOS:  LOS: 0 days  Chief Complaint "SI"  Primary Psychiatric Diagnoses   Schizophrenia  Homelessness  Delusional  Assessment  Aaron Rubio is a 43 y.o. male admitted: Presented to the ED on 11/25/2023  4:29 PM for delusional disorder, paranoia. He carries the psychiatric diagnoses of schizophrenia and has a past medical history of chronic leg and back pain.    His current presentation of delusions, hallucinations, non compliant with medications is most consistent with decompensating schizophrenia. He meets criteria for inpatient psychiatric admission based on current symptoms.  Current outpatient psychotropic medications include Prozac , Atarax , Seroquel , Trazodone  and historically he has had a positive response to these medications. He was non compliant with medications prior to admission as evidenced by patient response. On initial examination, patient is delusional and responding to internal stimuli. Please see plan below for detailed recommendations.     Diagnoses:  Active Hospital problems: Principal Problem:   Chronic paranoid schizophrenia (HCC) Active Problems:   Polysubstance abuse (HCC)    Plan   ## Psychiatric Medication Recommendations:  Continue patient's home medication   ## Medical Decision Making Capacity: Not specifically addressed in this encounter   ## Further Work-up:  -- No further workup needed at this time EKG or UDS -- most recent EKG on 11/02/2023 had QtC of 442 --  Pertinent labwork reviewed earlier this admission includes: CBC, EKG, UDS-Positive for Cocaine and Cannabis.     ## Disposition:-- We recommend inpatient psychiatric hospitalization. Patient is under voluntary admission status at this time; please IVC if attempts to leave hospital.   ## Behavioral / Environmental: -To minimize splitting of staff, assign one staff person to communicate all information from the team when feasible. or Utilize compassion and acknowledge the patient's experiences while setting clear and realistic expectations for care.                ## Safety and Observation Level:  - Based on my clinical evaluation, I estimate the patient to be at low risk of self harm in the current setting. - At this time, we recommend  routine. This decision is based on my review of the chart including patient's history and current presentation, interview of the patient, mental status examination, and consideration of suicide risk including evaluating suicidal ideation, plan, intent, suicidal or self-harm behaviors, risk factors, and protective factors. This judgment is based on our ability to directly address suicide risk, implement suicide prevention strategies, and develop a safety plan while the patient is in the clinical setting. Please contact our team if there is a concern that risk level has changed.   CSSR Risk Category:C-SSRS RISK CATEGORY: Low Risk   Suicide Risk Assessment: Patient has following modifiable risk factors for suicide: recklessness and medication noncompliance, which we are addressing by recommended inpatient psychiatric admission. Patient has following non-modifiable or demographic risk factors for suicide: male gender  and psychiatric hospitalization Patient has the following protective factors against suicide: Supportive friends   Thank you for this consult request. Recommendations have been communicated to the primary team.  We will recommend inpatient psychiatric  admission and continue to follow patient at this time.   Aaron Maxcy C Fayrene Towner, NP-PMHNP-BC       History of Present Illness  Relevant Aspects of Hospital ED Course:  Admitted on 11/25/2023  for suicidal ideation and hallucinations.    Patient Report:  On evaluation today patient remained disorganized and delusional stating he has higher power.  He was rambling from one topic to another and added he wanted [employer] staff to kill him yesterday with their Guns.  Patient does not answer to questions but kept saying he is confused now and cannot answer much questions.  Patient is suicidal but does not have plan to kill himself but will like Security staff to kill him with their Gun.  Patient states he is homeless and hospitals kicks him out to the street and that makes him feel worthless and suicidal.  Then he changes and stated that he knows much about his body and Treatment more than Psychiatrist and Psychologist.  He has been offered a bed at [facility] where he was last week.  He is refusing to go there but due to his cognitive impairment, poor decision making and suicide ideation wanting Security to shot him we will IVC to go and accept care.  Patient recommended for inpatient treatment, for crisis management, safety and stabilization.    Psych ROS:  Depression: Denies  Anxiety:  Endorses Mania (lifetime and current): Denies Psychosis: (lifetime and current): Yes   Collateral information:  Contacted None: The patient he has no family or no one to contact    Review of Systems  Psychiatric/Behavioral:  Positive for hallucinations, substance abuse and suicidal ideas.      Psychiatric and Social History  Psychiatric History:  Information collected from chart review   Prev Dx/Sx: Schizophrenia Current Psych Provider: None Home Meds (current): See above Previous Med Trials: Yes Therapy: None   Prior Psych Hospitalization: Yes Prior Self Harm: Unknown Prior Violence: Yes   Family Psych  History: Unknown Family Hx suicide: Unknown   Social History:  Developmental Hx: Deferred Educational Hx: Unknown Occupational Hx: Unemployed Legal Hx: Unknown Living Situation: Homeless Spiritual Hx: Yes Access to weapons/lethal means: Denies   Substance History Alcohol: Unknown   Tobacco: Yes Illicit drugs: Yes Prescription drug abuse: Unknown Rehab hx: Unknown  Exam Findings  Physical Exam:  Vital Signs:  Temp:  [97.8 F (36.6 C)-98.6 F (37 C)] 97.8 F (36.6 C) (05/26 0604) Pulse Rate:  [70-106] 70 (05/26 0604) Resp:  [16-18] 18 (05/26 0604) BP: (102-114)/(55-78) 102/55 (05/26 0604) SpO2:  [100 %] 100 % (05/26 0604) Blood pressure (!) 102/55, pulse 70, temperature 97.8 F (36.6 C), resp. rate 18, SpO2 100%. There is no height or weight on file to calculate BMI.  Physical Exam Vitals and nursing note reviewed. Exam conducted with a chaperone present.  Neurological:     Mental Status: He is alert.  Psychiatric:        Attention and Perception: He is inattentive. He perceives auditory hallucinations.        Mood and Affect: Mood is anxious.        Speech: Speech is rapid and pressured.        Behavior: Behavior is cooperative.        Thought Content: Thought content is  paranoid and delusional.        Cognition and Memory: Cognition is impaired.        Judgment: Judgment is impulsive.     Mental Status Exam: General Appearance: Bizarre  Orientation:  Other:  self and environment   Memory:  Immediate;   Poor Remote;   Poor  Concentration:  Concentration: Poor and Attention Span: Poor  Recall:  Poor  Attention  Poor  Eye Contact:  Fair  Speech:  Garbled and Pressured, Disorganized  Language:  Fair  Volume:  Normal  Mood: anxious  Affect:  Full Range  Thought Process:  Delusional   Thought Content:  Hallucinations: Auditory and Paranoid Ideation  Suicidal Thoughts:  No  Homicidal Thoughts:  No  Judgement:  Impaired  Insight:  Lacking  Psychomotor  Activity:  Normal  Akathisia:  NA  Fund of Knowledge:  NA    Assets:  Presenter, broadcasting Social Support  Cognition:  Impaired,  Moderate  ADL's:  Impaired  AIMS (if indicated):        Other History   These have been pulled in through the EMR, reviewed, and updated if appropriate.  Family History:  The patient's family history is not on file.  Medical History: Past Medical History:  Diagnosis Date   Hypertension    Schizophrenia [facility])     Surgical History: History reviewed. No pertinent surgical history.   Medications:   Current Facility-Administered Medications:    acetaminophen  (TYLENOL ) tablet 650 mg, 650 mg, Oral, Q4H PRN, Nanavati, Ankit, MD   gabapentin  (NEURONTIN ) capsule 300 mg, 300 mg, Oral, TID, [name], [name], PMHNP, 300 mg at 11/26/23 1055   hydrOXYzine  (ATARAX ) tablet 25 mg, 25 mg, Oral, TID PRN, [name], [name], PMHNP   lip balm (CARMEX) ointment, , Topical, PRN, Young, Travis J, DO   risperiDONE (RISPERDAL M-TABS) disintegrating tablet 2 mg, 2 mg, Oral, Q8H PRN **AND** LORazepam  (ATIVAN ) tablet 1 mg, 1 mg, Oral, PRN **AND** ziprasidone (GEODON) injection 20 mg, 20 mg, Intramuscular, PRN, Nanavati, Ankit, MD   OLANZapine  zydis (ZYPREXA ) disintegrating tablet 10 mg, 10 mg, Oral, BID, [name], [name], PMHNP, 10 mg at 11/26/23 1055   ondansetron  (ZOFRAN ) tablet 4 mg, 4 mg, Oral, Q8H PRN, Nanavati, Ankit, MD   zolpidem (AMBIEN) tablet 5 mg, 5 mg, Oral, QHS PRN, Nanavati, Ankit, MD, 5 mg at 11/25/23 2123  Current Outpatient Medications:    amLODipine  (NORVASC ) 10 MG tablet, Take 1 tablet (10 mg total) by mouth daily. (Patient not taking: Reported on 11/25/2023), Disp: 30 tablet, Rfl: 0   cyclobenzaprine  (FLEXERIL ) 10 MG tablet, Take 0.5-1 tablets (5-10 mg total) by mouth at bedtime as needed for muscle spasms. (Patient not taking: Reported on 11/15/2023), Disp: 7 tablet, Rfl: 0   gabapentin  (NEURONTIN ) 300 MG capsule, Take 1  capsule (300 mg total) by mouth 3 (three) times daily. (Patient not taking: Reported on 11/25/2023), Disp: 90 capsule, Rfl: 0   hydrOXYzine  (ATARAX ) 25 MG tablet, Take 1 tablet (25 mg total) by mouth 3 (three) times daily as needed for anxiety. (Patient not taking: Reported on 11/25/2023), Disp: 60 tablet, Rfl: 0   OLANZapine  (ZYPREXA ) 10 MG tablet, Take 1 tablet (10 mg) in the morning daily and 2 tablets (20 mg) every night (Patient not taking: Reported on 11/25/2023), Disp: 90 tablet, Rfl: 0   traZODone  (DESYREL ) 50 MG tablet, Take 1 tablet (50 mg total) by mouth at bedtime as needed for sleep. (Patient not taking: Reported on 11/25/2023), Disp: 30  tablet, Rfl: 0  Allergies: No Known Allergies  Damieon Armendariz C Keenan Trefry, NP-PMHNP-BC

## 2023-11-26 NOTE — ED Notes (Signed)
 Patient alert.  Patient upset with treatment plan.  Patient denies suicidal ideation at this time.

## 2023-11-26 NOTE — ED Notes (Signed)
 Patients is in all blue paper scrubs.

## 2023-11-26 NOTE — ED Notes (Signed)
 Patient has been having loud conversations with himself ever since going to the restroom. When patient was in the restroom he was cursing loudly and yelling to himself. Patient is lying on the bed having a verbally aggressive conversation with himself and crying.

## 2023-11-27 NOTE — ED Notes (Signed)
 Pt is sleeping

## 2023-11-27 NOTE — ED Notes (Signed)
 Patient off unit to facility per provider. Patient alert, cooperative, no s/s of distress at this time. Discharge information and belongings given to sheriff for transport. Patient ambulatory off unit, escorted and transported by [facility].

## 2023-11-27 NOTE — ED Provider Notes (Signed)
 Emergency Medicine Observation Re-evaluation Note  Aaron Rubio is a 43 y.o. male, seen on rounds today.  Pt initially presented to the ED for complaints of Suicidal Currently, the patient is resting.  Physical Exam  BP 119/71   Pulse 63   Temp 97.6 F (36.4 C) (Oral)   Resp 18   SpO2 99%  Physical Exam   ED Course / MDM  EKG:   I have reviewed the labs performed to date as well as medications administered while in observation.  Recent changes in the last 24 hours include placement secured.  Plan  Current plan is for placement to [facility].    Lind Repine, MD 11/27/23 [phone]

## 2023-12-06 ENCOUNTER — Ambulatory Visit (HOSPITAL_COMMUNITY)
Admission: EM | Admit: 2023-12-06 | Discharge: 2023-12-07 | Disposition: A | Discharge status: Home / Self Care | Attending: Psychiatry | Admitting: Psychiatry

## 2023-12-06 DIAGNOSIS — F200 Paranoid schizophrenia: Secondary | ICD-10-CM | POA: Diagnosis not present

## 2023-12-06 DIAGNOSIS — F1090 Alcohol use, unspecified, uncomplicated: Secondary | ICD-10-CM | POA: Insufficient documentation

## 2023-12-06 DIAGNOSIS — R45851 Suicidal ideations: Secondary | ICD-10-CM | POA: Diagnosis not present

## 2023-12-06 DIAGNOSIS — F1690 Hallucinogen use, unspecified, uncomplicated: Secondary | ICD-10-CM | POA: Diagnosis not present

## 2023-12-06 DIAGNOSIS — I10 Essential (primary) hypertension: Secondary | ICD-10-CM | POA: Diagnosis not present

## 2023-12-06 DIAGNOSIS — F32A Depression, unspecified: Secondary | ICD-10-CM | POA: Insufficient documentation

## 2023-12-06 DIAGNOSIS — F1490 Cocaine use, unspecified, uncomplicated: Secondary | ICD-10-CM | POA: Diagnosis not present

## 2023-12-06 LAB — CBC WITH DIFFERENTIAL/PLATELET
Abs Immature Granulocytes: 0.07 10*3/uL (ref 0.00–0.07)
Basophils Absolute: 0.1 10*3/uL (ref 0.0–0.1)
Basophils Relative: 1 %
Eosinophils Absolute: 0.7 10*3/uL — ABNORMAL HIGH (ref 0.0–0.5)
Eosinophils Relative: 5 %
HCT: 44.4 % (ref 39.0–52.0)
Hemoglobin: 13.9 g/dL (ref 13.0–17.0)
Immature Granulocytes: 1 %
Lymphocytes Relative: 18 %
Lymphs Abs: 2.4 10*3/uL (ref 0.7–4.0)
MCH: 26.7 pg (ref 26.0–34.0)
MCHC: 31.3 g/dL (ref 30.0–36.0)
MCV: 85.4 fL (ref 80.0–100.0)
Monocytes Absolute: 1.4 10*3/uL — ABNORMAL HIGH (ref 0.1–1.0)
Monocytes Relative: 11 %
Neutro Abs: 8.4 10*3/uL — ABNORMAL HIGH (ref 1.7–7.7)
Neutrophils Relative %: 64 %
Platelets: 272 10*3/uL (ref 150–400)
RBC: 5.2 MIL/uL (ref 4.22–5.81)
RDW: 13.6 % (ref 11.5–15.5)
WBC: 13 10*3/uL — ABNORMAL HIGH (ref 4.0–10.5)
nRBC: 0 % (ref 0.0–0.2)

## 2023-12-06 LAB — COMPREHENSIVE METABOLIC PANEL WITH GFR
ALT: 118 U/L — ABNORMAL HIGH (ref 0–44)
AST: 64 U/L — ABNORMAL HIGH (ref 15–41)
Albumin: 4.2 g/dL (ref 3.5–5.0)
Alkaline Phosphatase: 60 U/L (ref 38–126)
Anion gap: 11 (ref 5–15)
BUN: 27 mg/dL — ABNORMAL HIGH (ref 6–20)
CO2: 27 mmol/L (ref 22–32)
Calcium: 9.8 mg/dL (ref 8.9–10.3)
Chloride: 101 mmol/L (ref 98–111)
Creatinine, Ser: 1.54 mg/dL — ABNORMAL HIGH (ref 0.61–1.24)
GFR, Estimated: 57 mL/min — ABNORMAL LOW (ref >60)
Glucose, Bld: 86 mg/dL (ref 70–99)
Potassium: 4.2 mmol/L (ref 3.5–5.1)
Sodium: 139 mmol/L (ref 135–145)
Total Bilirubin: 0.7 mg/dL (ref 0.0–1.2)
Total Protein: 7.1 g/dL (ref 6.5–8.1)

## 2023-12-06 LAB — ETHANOL: Alcohol, Ethyl (B): <15 mg/dL (ref <15)

## 2023-12-06 LAB — TSH: TSH: 0.647 u[IU]/mL (ref 0.350–4.500)

## 2023-12-06 LAB — GLUCOSE, CAPILLARY: Glucose-Capillary: 117 mg/dL — ABNORMAL HIGH (ref 70–99)

## 2023-12-06 MED ORDER — DIPHENHYDRAMINE HCL 50 MG/ML IJ SOLN: 50.0000 mg | Freq: Three times a day (TID) | INTRAMUSCULAR | Status: DC | PRN

## 2023-12-06 MED ORDER — HALOPERIDOL 5 MG PO TABS: 5.0000 mg | ORAL_TABLET | Freq: Three times a day (TID) | ORAL | Status: DC | PRN

## 2023-12-06 MED ORDER — GABAPENTIN 300 MG PO CAPS
300.0000 mg | ORAL_CAPSULE | Freq: Three times a day (TID) | ORAL | Status: DC
  Administered 2023-12-06 – 2023-12-07 (×3): 300 mg via ORAL
  Filled 2023-12-06 (×3): qty 1

## 2023-12-06 MED ORDER — LORAZEPAM 2 MG/ML IJ SOLN: 2.0000 mg | Freq: Three times a day (TID) | INTRAMUSCULAR | Status: DC | PRN

## 2023-12-06 MED ORDER — HALOPERIDOL LACTATE 5 MG/ML IJ SOLN: 5.0000 mg | Freq: Three times a day (TID) | INTRAMUSCULAR | Status: DC | PRN

## 2023-12-06 MED ORDER — HYDROXYZINE HCL 25 MG PO TABS: 25.0000 mg | ORAL_TABLET | Freq: Three times a day (TID) | ORAL | Status: DC | PRN

## 2023-12-06 MED ORDER — ALUM & MAG HYDROXIDE-SIMETH 200-200-20 MG/5ML PO SUSP: 30.0000 mL | ORAL | Status: DC | PRN

## 2023-12-06 MED ORDER — OLANZAPINE 10 MG PO TABS
10.0000 mg | ORAL_TABLET | Freq: Two times a day (BID) | ORAL | Status: DC
  Administered 2023-12-06 – 2023-12-07 (×3): 10 mg via ORAL
  Filled 2023-12-06 (×3): qty 1

## 2023-12-06 MED ORDER — ACETAMINOPHEN 325 MG PO TABS: 650.0000 mg | ORAL_TABLET | Freq: Four times a day (QID) | ORAL | Status: DC | PRN

## 2023-12-06 MED ORDER — HALOPERIDOL LACTATE 5 MG/ML IJ SOLN: 10.0000 mg | Freq: Three times a day (TID) | INTRAMUSCULAR | Status: DC | PRN

## 2023-12-06 MED ORDER — DIPHENHYDRAMINE HCL 50 MG PO CAPS: 50.0000 mg | ORAL_CAPSULE | Freq: Three times a day (TID) | ORAL | Status: DC | PRN

## 2023-12-06 MED ORDER — CYCLOBENZAPRINE HCL 10 MG PO TABS: 5.0000 mg | ORAL_TABLET | Freq: Every evening | ORAL | Status: DC | PRN

## 2023-12-06 MED ORDER — MAGNESIUM HYDROXIDE 400 MG/5ML PO SUSP: 30.0000 mL | Freq: Every day | ORAL | Status: DC | PRN

## 2023-12-06 MED ORDER — AMLODIPINE BESYLATE 10 MG PO TABS
10.0000 mg | ORAL_TABLET | Freq: Every day | ORAL | Status: DC
  Administered 2023-12-07: 10 mg via ORAL
  Filled 2023-12-06: qty 1

## 2023-12-06 NOTE — Progress Notes (Signed)
 Inpatient Psychiatric Referral  Patient was recommended inpatient per Nadara Auerbach, NP. There are no available beds at [facility], per [facility], RN. Patient was referred to the following out of network facilities:  Destination  Service Provider Request Status Address Phone Fax  [facility] Pending - Request Sent [address] [place] [address] [phone]  [facility] Pending - Request Sent [address] [place], [place] [address] [phone]  [facility] Pending - Request Sent [address] [place] [address] [phone]  [facility] Pending - Request Sent [address] [place]va Dice [place] [address] [phone]  [facility] Pending - Request Sent [address] [place] [address] [phone]  [facility] Pending - Request Sent [address] [place] [address] [phone]  [facility] Pending - Request Sent [address] [place] [address] [phone]  [facility] [address] [facility] Pending - Request Sent [address] Sharren Decree [place] [phone]  [facility] Pending - Request Sent [address], [place] [address] [phone]  [facility] Pending - Request Sent [address], [place] [address]      Situation ongoing, CSW to continue following and update chart as more information becomes available.   Albertus Alt MSW, LCSWA 12/06/2023  4:55PM

## 2023-12-06 NOTE — Discharge Instructions (Addendum)
 Pt transferred to [facility] for inpatient treatment.

## 2023-12-06 NOTE — ED Notes (Signed)
 Patient presented to [facility] stating he needs to be admitted here. Patient's speech is slow and soft. When asked about SI/HI and AVH, pt rambles. He endorses pain in his back and feet. Skin check conducted by this nurse and Curt Dover, MHT. Patient's feet are red and sore to the touch. No visible wounds. Pt walks as though he is in pain. Patient oriented to the unit. Lunch and beverage provided. No additional needs at this time. We will continue to monitor for safety.

## 2023-12-06 NOTE — ED Notes (Signed)
 Received a call from Trisstan from [place] to inquire about patient for possible admission. Per Joette Mustard, patient will be accepted to [facility].

## 2023-12-06 NOTE — Progress Notes (Signed)
 Pt has been accepted to [facility] on 12/07/2023. Bed assignment: Artice Bills   Pt meets inpatient criteria per Nadara Auerbach, NP   Attending Physician will be Kieran Pellet, MD  Report can be called to: [phone]  Pt can arrive after after 9 AM  Care Team Notified: Nadara Auerbach, NP, Keri Peat, RN, Ardie Kras, RN

## 2023-12-06 NOTE — ED Notes (Signed)
Patient observed resting quietly, eyes closed. Respirations equal and unlabored. Will continue to monitor for safety.  

## 2023-12-06 NOTE — BH Assessment (Signed)
 Comprehensive Clinical Assessment (CCA) Note  12/06/2023 Aaron Rubio [id number]  DISPOSITION: Per Nadara Auerbach NP pt is recommended for inpatient psychiatric admission  The patient demonstrates the following risk factors for suicide: Chronic risk factors for suicide include: psychiatric disorder of schizophrenia and previous suicide attempts in the past. Acute risk factors for suicide include: unemployment and social withdrawal/isolation. Protective factors for this patient include: hope for the future. Considering these factors, the overall suicide risk at this point appears to be low. Patient is appropriate for outpatient follow up.   Per Triage assessment: "Aaron Rubio presents to [facility] voluntarily unaccompanied. Pt states that he feels like he needs to be admitted and connected to the behavioral health hospital. Pt states that he is homeless and knew around 9 this morning that he needed to be at the hospital. Pt is uncooperative and not willing to answer traige questions. Pt is rambling about being kidnapped and tortured with a game of russian roulette."  With further assessment: Pt was calm, cooperative, alert and seemed somewhat oriented. As assessment progressed pt became more and more disorganized in his speech and his thoughts. Often, when asked a specific question, pt would ramble in a flight of ideas often including paranoid beliefs. For example, when asked if he has access to firearms, pt answered with a rambling answer that did not answer the question and included "the CIA" "the Feds" "scams in your email" and religious ramblings.   Pt has been seen recently at the [facility] 11/25/2023, 11/14/2023, 11/02/2023, 10/24/2023 and 10/19/2023 with a similar presentation. Hx of schizophrenia and delusional d/o with paranoia. Pt stated he was taking prescribed medication (Seroquel ) but could not answer who was prescribing it for him.  Pt stated he does not see an OP therapist. Pt stated he was having  SI earlier today but not during the assessment. Pt stated that he had tried to hang himself "in my cell" in 2008. When asked about wanting to harm others, he answered that there was a dog he would like to take away from its owner and then continued rambling in a disorganized manner. When asked about AVH, pt answered that he can "hear myself talk outside my head." Pt did seem paranoid and often mentioned or talked about "people" trying to exploit him or making him "play a game" that was dangerous. Pt denied access to firearms and any current legal issues.   Pt answered that he has been married and has one adult son. Pt stated he is homeless and is originally from [place], later living in [place] before living in [place]. Pt could not answer any questions about how or why he moved here beyond he drove a truck here with food. Pt stated that he receives disability income although it is unclear which program from which he receives benefits. Pt stated that he regularly consumes substances including alcohol, mushrooms and cocaine. He could not answer any questions about his substance use but stated he had not had any today.   Pt was calm, cooperative, alert and seemed somewhat oriented. Pt's eye contact was poor, his speech was low volume and his movement was within normal limits. Pt was casually dressed, seemed adequately  and it appeared his pants were too small for him. Pt's judgment and insight seemed impaired. Pt's mood seemed somewhat depressed and his flat affect was congruent.    Chief Complaint:  Chief Complaint  Patient presents with   Evaluation   Visit Diagnosis:  Schizophrenia    CCA Screening,  Triage and Referral (STR)  Patient Reported Information How did you hear about us ? Self  What Is the Reason for Your Visit/Call Today? Aaron Rubio presents to [facility] voluntarily unaccompanied. Pt states that he feels like he needs to be admitted and connected to the behavioral health hospital. Pt states that he is  homeless and knew around 9 this morning that he needed to be at the hospital. Pt is uncooperative and not willing to answer traige questions. Pt is rambling about being kidnapped and tortured with a game of Publishing copy.  How Long Has This Been Causing You Problems? <Week  What Do You Feel Would Help You the Most Today? -- (UTA)   Have You Recently Had Any Thoughts About Hurting Yourself? Yes  Are You Planning to Commit Suicide/Harm Yourself At This time? -- Aaron Rubio)   Flowsheet Row ED from 12/06/2023 in [facility] ED from 11/25/2023 in [facility] Emergency Department at [facility] ED from 11/14/2023 in [facility]  C-SSRS RISK CATEGORY No Risk High Risk High Risk       Have you Recently Had Thoughts About Hurting Someone Else? -- (UTA)  Are You Planning to Harm Someone at This Time? -- (UTA)  Explanation: UTA   Have You Used Any Alcohol or Drugs in the Past 24 Hours? -- (UTA)  How Long Ago Did You Use Drugs or Alcohol? 2-3 days ago What Did You Use and How Much? UTA   Do You Currently Have a Therapist/Psychiatrist? No  Name of Therapist/Psychiatrist: Name of Therapist/Psychiatrist: na   Have You Been Recently Discharged From Any Office Practice or Programs? Yes  Explanation of Discharge From Practice/Program: Patient was discharged from [facility] 11/03/2023 and at [facility] it appears on 11/20/23     CCA Screening Triage Referral Assessment Type of Contact: Face-to-Face  Telemedicine Service Delivery:   Is this Initial or Reassessment?   Date Telepsych consult ordered in [facility]:    Time Telepsych consult ordered in [facility]:    Location of Assessment: [facility] Assessment Services  Provider Location: [facility] Assessment Services   Collateral Involvement: none   Does Patient Have a Automotive engineer Guardian? No  Legal Guardian Contact Information: na  Copy of Legal Guardianship Form: -- (na)  Legal  Guardian Notified of Arrival: -- (na)  Legal Guardian Notified of Pending Discharge: -- (na)  If Minor and Not Living with Parent(s), Who has Custody? adult  Is CPS involved or ever been involved? Never (none reported)  Is APS involved or ever been involved? Never   Patient Determined To Be At Risk for Harm To Self or Others Based on Review of Patient Reported Information or Presenting Complaint? -- (UTA)  Method: No Plan  Availability of Means: No access or NA  Intent: Vague intent or NA  Notification Required: No need or identified person  Additional Information for Danger to Others Potential: Active psychosis; Previous attempts  Additional Comments for Danger to Others Potential: none  Are There Guns or Other Weapons in Your Home? -- Aaron Rubio)  Types of Guns/Weapons: na  Are These Weapons Safely Secured?                            -- Aaron Rubio)  Who Could Verify You Are Able To Have These Secured: none offered  Do You Have any Outstanding Charges, Pending Court Dates, Parole/Probation? uta- Pt was not able to  answer some questions due to his psychotic symptoms.  Contacted To Inform of Risk of Harm To Self or Others: -- (na)    Does Patient Present under Involuntary Commitment? No    [place] of Residence: [place]   Patient Currently Receiving the Following Services: Not Receiving Services   Determination of Need: Emergent (2 hours) (Per Nadara Auerbach NP pt is recommended for inpatient psychiatric admission)   Options For Referral: Inpatient Hospitalization     CCA Biopsychosocial Patient Reported Schizophrenia/Schizoaffective Diagnosis in Past: Yes   Strengths: Able to accept help   Mental Health Symptoms Depression:  Irritability; Change in energy/activity; Difficulty Concentrating; Hopelessness   Duration of Depressive symptoms: Duration of Depressive Symptoms: Greater than two weeks   Mania:  Racing thoughts   Anxiety:   Tension; Restlessness;  Worrying; Sleep; Fatigue; Difficulty concentrating   Psychosis:  Delusions; Hallucinations   Duration of Psychotic symptoms: Duration of Psychotic Symptoms: Greater than six months   Trauma:  None (UTA)   Obsessions:  None   Compulsions:  None   Inattention:  Disorganized   Hyperactivity/Impulsivity:  Feeling of restlessness   Oppositional/Defiant Behaviors:  None   Emotional Irregularity:  Recurrent suicidal behaviors/gestures/threats   Other Mood/Personality Symptoms:  none    Mental Status Exam Appearance and self-care  Stature:  Tall   Weight:  Average weight   Clothing:  Careless/inappropriate   Grooming:  Neglected   Cosmetic use:  None   Posture/gait:  Normal   Motor activity:  Restless   Sensorium  Attention:  Confused   Concentration:  Anxiety interferes; Focuses on irrelevancies; Scattered   Orientation:  Person; Place; Situation   Recall/memory:  Defective in Short-term; Defective in Remote   Affect and Mood  Affect:  Labile; Depressed; Flat   Mood:  Irritable; Negative; Depressed   Relating  Eye contact:  Avoided   Facial expression:  Anxious; Depressed   Attitude toward examiner:  Irritable; Guarded; Suspicious; Cooperative   Thought and Language  Speech flow: Flight of Ideas; Garbled; Soft   Thought content:  Delusions   Preoccupation:  Other (Comment); Ruminations (delusions)   Hallucinations:  Auditory; Visual   Organization:  Disorganized   Company secretary of Knowledge:  Poor   Intelligence:  Needs investigation Industrial/product designer)   Abstraction:  Functional   Judgement:  Impaired   Reality Testing:  Distorted   Insight:  Gaps; Lacking   Decision Making:  Impulsive   Social Functioning  Social Maturity:  Irresponsible; Impulsive   Social Judgement:  "Chief of Staff"; Heedless   Stress  Stressors:  Other (Comment) (UTA- Pt was not able to answer some questions due to his psychotic symptoms.)   Coping Ability:   Deficient supports; Exhausted; Overwhelmed   Skill Deficits:  Communication; Decision making; Interpersonal; Responsibility; Self-control; Self-care   Supports:  Support needed     Religion: Religion/Spirituality Are You A Religious Person?:  (UTA- Pt was not able to answer some questions due to his psychotic symptoms.) How Might This Affect Treatment?: UTA-Pt was not able to answer some questions due to his psychotic symptoms.  Leisure/Recreation: Leisure / Recreation Do You Have Hobbies?:  (UTA-Pt was not able to answer some questions due to his psychotic symptoms.) Leisure and Hobbies: UTA-Pt was not able to answer some questions due to his psychotic symptoms.  Exercise/Diet: Exercise/Diet Do You Exercise?:  (UTA-Pt was not able to answer some questions due to his psychotic symptoms.) Have You Gained or Lost A Significant Amount of Weight in  the Past Six Months?:  (UTA) Do You Follow a Special Diet?:  (UTA) Do You Have Any Trouble Sleeping?:  (UTA) Explanation of Sleeping Difficulties: UTA-Pt was not able to answer some questions due to his psychotic symptoms.   CCA Employment/Education Employment/Work Situation: Employment / Work Situation Employment Situation: Unemployed Patient's Job has Been Impacted by Current Illness: No Has Patient ever Been in Equities trader?: No  Education: Education Is Patient Currently Attending School?: No Last Grade Completed: 9 (Per chart- Pt was not able to answer some questions due to his psychotic symptoms.) Did You Attend College?:  (UTA-Pt was not able to answer some questions due to his psychotic symptoms.) Did You Have An Individualized Education Program (IIEP):  (UTA- Pt was not able to answer some questions due to his psychotic symptoms.) Did You Have Any Difficulty At School?:  (UTA-Pt was not able to answer some questions due to his psychotic symptoms.)   CCA Family/Childhood History Family and Relationship History: Family  history Marital status:  (Pt was not able to answer some questions due to his psychotic symptoms.) Does patient have children?: Yes How many children?: 1 (adult son per pt) How is patient's relationship with their children?: Pt was not able to answer some questions due to his psychotic symptoms.  Childhood History:  Childhood History By whom was/is the patient raised?: Other (Comment) (Pt was not able to answer some questions due to his psychotic symptoms.) Did patient suffer any verbal/emotional/physical/sexual abuse as a child?: No Has patient ever been sexually abused/assaulted/raped as an adolescent or adult?: No Witnessed domestic violence?: No Has patient been affected by domestic violence as an adult?: No       CCA Substance Use Alcohol/Drug Use: Alcohol / Drug Use Pain Medications: See MAR Prescriptions: See MAR Over the Counter: See MAR History of alcohol / drug use?: Yes Longest period of sobriety (when/how long): UTA-Pt was not able to answer some questions due to his psychotic symptoms. Negative Consequences of Use:  (UTA-Pt was not able to answer some questions due to his psychotic symptoms.) Withdrawal Symptoms:  (UTA-Pt was not able to answer some questions due to his psychotic symptoms.) Substance #1 Name of Substance 1: alcohol 1 - Age of First Use: Pt was not able to answer some questions due to his psychotic symptoms. 1 - Amount (size/oz): Pt was not able to answer some questions due to his psychotic symptoms. 1 - Frequency: Pt was not able to answer some questions due to his psychotic symptoms. 1 - Duration: ongoing 1 - Last Use / Amount: 2-3 days ago per pt 1 - Method of Aquiring: Pt was not able to answer some questions due to his psychotic symptoms. 1- Route of Use: drink Substance #2 Name of Substance 2: muchrooms 2 - Age of First Use: Pt was not able to answer some questions due to his psychotic symptoms. 2 - Amount (size/oz): Pt was not able to answer  some questions due to his psychotic symptoms. 2 - Frequency: Pt was not able to answer some questions due to his psychotic symptoms. 2 - Duration: Pt was not able to answer some questions due to his psychotic symptoms. 2 - Last Use / Amount: Pt was not able to answer some questions due to his psychotic symptoms. 2 - Method of Aquiring: Pt was not able to answer some questions due to his psychotic symptoms. 2 - Route of Substance Use: Pt was not able to answer some questions due to his psychotic symptoms.  Substance #3 Name of Substance 3: cocaine 3 - Age of First Use: Pt was not able to answer some questions due to his psychotic symptoms. 3 - Amount (size/oz): Pt was not able to answer some questions due to his psychotic symptoms. 3 - Frequency: Pt was not able to answer some questions due to his psychotic symptoms. 3 - Duration: Pt was not able to answer some questions due to his psychotic symptoms. 3 - Last Use / Amount: Pt was not able to answer some questions due to his psychotic symptoms. 3 - Method of Aquiring: Pt was not able to answer some questions due to his psychotic symptoms. 3 - Route of Substance Use: Pt was not able to answer some questions due to his psychotic symptoms.                   ASAM's:  Six Dimensions of Multidimensional Assessment  Dimension 1:  Acute Intoxication and/or Withdrawal Potential:   Dimension 1:  Description of individual's past and current experiences of substance use and withdrawal: uta-Pt was not able to answer some questions due to his psychotic symptoms.  Dimension 2:  Biomedical Conditions and Complications:   Dimension 2:  Description of patient's biomedical conditions and  complications: uta-Pt was not able to answer some questions due to his psychotic symptoms.  Dimension 3:  Emotional, Behavioral, or Cognitive Conditions and Complications:  Dimension 3:  Description of emotional, behavioral, or cognitive conditions and complications: uta-  Pt was not able to answer some questions due to his psychotic symptoms.  Dimension 4:  Readiness to Change:  Dimension 4:  Description of Readiness to Change criteria: uta-Pt was not able to answer some questions due to his psychotic symptoms.  Dimension 5:  Relapse, Continued use, or Continued Problem Potential:  Dimension 5:  Relapse, continued use, or continued problem potential critiera description: uta- Pt was not able to answer some questions due to his psychotic symptoms.  Dimension 6:  Recovery/Living Environment:  Dimension 6:  Recovery/Iiving environment criteria description: uta-Pt was not able to answer some questions due to his psychotic symptoms.  ASAM Severity Score:    ASAM Recommended Level of Treatment: ASAM Recommended Level of Treatment:  Aaron Rubio)   Substance use Disorder (SUD) Substance Use Disorder (SUD)  Checklist Symptoms of Substance Use:  Aaron Rubio)  Recommendations for Services/Supports/Treatments: Recommendations for Services/Supports/Treatments Recommendations For Services/Supports/Treatments: Inpatient Hospitalization, Individual Therapy, Medication Management  Disposition Recommendation per psychiatric provider: We recommend inpatient psychiatric hospitalization when medically cleared. Patient is under voluntary admission status at this time; please IVC if attempts to leave hospital.   DSM5 Diagnoses: Patient Active Problem List   Diagnosis Date Noted   Chronic paranoid schizophrenia (HCC) 10/31/2023   Passive suicidal ideations 05/05/2023   Psychosis (HCC) 05/05/2023   Polysubstance abuse (HCC) 05/05/2023     Referrals to Alternative Service(s): Referred to Alternative Service(s):   Place:   Date:   Time:    Referred to Alternative Service(s):   Place:   Date:   Time:    Referred to Alternative Service(s):   Place:   Date:   Time:    Referred to Alternative Service(s):   Place:   Date:   Time:     Anihya Tuma T, Counselor

## 2023-12-06 NOTE — ED Notes (Signed)
PT given lunch 

## 2023-12-06 NOTE — ED Provider Notes (Signed)
 [facility] Continuous Assessment Admission H&P  Date: 12/06/23 Patient Name: Aaron Rubio MRN: [id number] Chief Complaint: "They just like to play games with me".   Diagnoses:  Final diagnoses:  Paranoid schizophrenia (HCC)    HPI: History of Present illness: Aaron Rubio 43 y.o., male patient presented to [facility] as a voluntary walk in unaccompanied with complaints of suicidal ideation and psychotic symptoms. He has a past psychiatric history of Paranoid Schizophrenia and polysubstance use.  Pertinent medical history including hypertension.  Patient was recently discharged from [facility] and does not have an outpatient psychiatrist or therapist.  Ryan Ogborn, is seen face to face by this provider, consulted with Dr. Docia Freeman; and chart reviewed on 12/06/23.  During evaluation Aaron Rubio is sitting in assessment room talking to himself, in no acute distress.  He is alert & oriented x 4, calm, cooperative but very distracted for this assessment.  His mood is depressed and dysphoric with congruent flat affect.  He has normal speech, however lacks eye contact and will only look at writer from the corner of his eye.  Objectively there is evidence of psychosis such as responding to internal stimuli, disorganized and scattered thoughts, paranoid delusions, thought preoccupation and illogical rambling.  Patient is able to converse at times however he requires redirection to answer assessment questions appropriately. Pt continues to verbalize suicidal ideations and denies current plans but states "I did try to hang before". He currently denies homicidal ideation but then states "I don't want to take no life, just someone's dog". He denies auditory hallucinations, however is observed responding to internal stimuli. He does not provide an answer about visual hallucinations.  Patient reports using alcohol, cocaine and mushrooms on Monday and reports only using it when he has the  money to but does not use either of these consistently.  He denies withdrawal symptoms.  He denies any medical/physical complaints or acute pain.   Throughout assessment patient is very disorganized and rambling illogical statements.  Patient is very paranoid and constantly states "people just like to play games with me, people do not know what they are doing, they just want a sell hand-held devices".  Patient makes comments about living in [place]  and [place]  but is unclear about living or how long he has been here in [place] .  When asked about access to firearms patient states "the FBI is taking over and [employer] is helping the ambulance driver but do not really help except to sell those handheld devices".  Patient is heard mumbling to himself throughout the assessment making statements about "Twitter, Facebook, the government being on a global network and having a contract that is all over ".  Patient reports poor sleep and states that he never sleeps because this to me people around him as he is currently homeless.  Patient reports that he was receiving some sort of supplemental income but states "they said they lost my information so they took my money for Medicaid and I have been trying to get it back but they have the own company and they keep plan around and they will know what they are doing ".  We discussed the plan for patient to receive inpatient psychiatric hospitalization for treatment and medication management. Pt agreeable to this plan.   Total Time spent with patient: 30 minutes  Musculoskeletal  Strength & Muscle Tone: within normal limits Gait & Station: normal Patient leans: N/A  Psychiatric Specialty Exam  Presentation General  Appearance:  Disheveled  Eye Contact: Fair  Speech: Normal Rate  Speech Volume: Decreased  Handedness: Right   Mood and Affect  Mood: Depressed; Dysphoric  Affect: Congruent; Flat   Thought Process  Thought  Processes: Disorganized  Descriptions of Associations:Circumstantial  Orientation:Full (Time, Place and Person)  Thought Content:Paranoid Ideation; Scattered  Diagnosis of Schizophrenia or Schizoaffective disorder in past: Yes  Duration of Psychotic Symptoms: Greater than six months  Hallucinations:Hallucinations: Other (comment) (Pt denied AH and would not provide appropriate response about VH.)  Ideas of Reference:Paranoia  Suicidal Thoughts:Suicidal Thoughts: Yes, Passive SI Passive Intent and/or Plan: Without Plan  Homicidal Thoughts:Homicidal Thoughts: No   Sensorium  Memory: Other (comment)  Judgment: Fair  Insight: Fair   Executive Functions  Concentration: Fair  Attention Span: Fair  Recall: Fair  Fund of Knowledge: Fair  Language: Fair   Psychomotor Activity  Psychomotor Activity: Psychomotor Activity: Normal   Assets  Assets: Communication Skills; Desire for Improvement; Financial Resources/Insurance; Housing; Physical Health; Resilience; Social Support; Vocational/Educational   Sleep  Sleep: Sleep: Poor Number of Hours of Sleep: 2   Nutritional Assessment (For OBS and FBC admissions only) Has the patient had a weight loss or gain of 10 pounds or more in the last 3 months?: No Has the patient had a decrease in food intake/or appetite?: No Does the patient have dental problems?: No Does the patient have eating habits or behaviors that may be indicators of an eating disorder including binging or inducing vomiting?: No Has the patient recently lost weight without trying?: 0 Has the patient been eating poorly because of a decreased appetite?: 0 Malnutrition Screening Tool Score: 0    Physical Exam Vitals and nursing note reviewed.  Constitutional:      Appearance: Normal appearance.  HENT:     Head: Normocephalic.     Nose: Nose normal.  Eyes:     Extraocular Movements: Extraocular movements intact.  Cardiovascular:     Rate  and Rhythm: Normal rate.  Pulmonary:     Effort: Pulmonary effort is normal.  Musculoskeletal:        General: Normal range of motion.     Cervical back: Normal range of motion.  Neurological:     General: No focal deficit present.     Mental Status: He is alert and oriented to person, place, and time.      Review of Systems  Constitutional: Negative.   HENT: Negative.    Eyes: Negative.   Respiratory: Negative.    Cardiovascular: Negative.   Gastrointestinal: Negative.   Genitourinary: Negative.   Musculoskeletal: Negative.   Neurological: Negative.   Endo/Heme/Allergies: Negative.   Psychiatric/Behavioral:  Positive for depression, hallucinations, substance abuse and suicidal ideas.  Blood pressure 126/84, pulse 97, temperature 98.1 F (36.7 C), temperature source Oral, resp. rate 18, SpO2 100%. There is no height or weight on file to calculate BMI.  Past Psychiatric History: Paranoid schizophrenia and polysubstance use.  Patient has received inpatient [facility] at [facility] and [facility].  Is the patient at risk to self? Yes  Has the patient been a risk to self in the past 6 months? Yes .    Has the patient been a risk to self within the distant past? Yes   Is the patient a risk to others? No   Has the patient been a risk to others in the past 6 months? No   Has the patient been a risk to others within the distant past? No  Past Medical History: Hypertension  Family History: None reported  Social History: Patient reports that he is currently homeless and unemployed.  He endorses using alcohol, cocaine and mushrooms occasionally (last use was Monday).  Last Labs:  Admission on 12/06/2023  Component Date Value Ref Range Status   WBC 12/06/2023 13.0 (H)  4.0 - 10.5 K/uL Final   RBC 12/06/2023 5.20  4.22 - 5.81 MIL/uL Final   Hemoglobin 12/06/2023 13.9  13.0 - 17.0 g/dL Final   HCT 16/04/9603 44.4  39.0 - 52.0 % Final   MCV 12/06/2023 85.4  80.0 - 100.0 fL Final    MCH 12/06/2023 26.7  26.0 - 34.0 pg Final   MCHC 12/06/2023 31.3  30.0 - 36.0 g/dL Final   RDW 54/03/8118 13.6  11.5 - 15.5 % Final   Platelets 12/06/2023 272  150 - 400 K/uL Final   nRBC 12/06/2023 0.0  0.0 - 0.2 % Final   Neutrophils Relative % 12/06/2023 64  % Final   Neutro Abs 12/06/2023 8.4 (H)  1.7 - 7.7 K/uL Final   Lymphocytes Relative 12/06/2023 18  % Final   Lymphs Abs 12/06/2023 2.4  0.7 - 4.0 K/uL Final   Monocytes Relative 12/06/2023 11  % Final   Monocytes Absolute 12/06/2023 1.4 (H)  0.1 - 1.0 K/uL Final   Eosinophils Relative 12/06/2023 5  % Final   Eosinophils Absolute 12/06/2023 0.7 (H)  0.0 - 0.5 K/uL Final   Basophils Relative 12/06/2023 1  % Final   Basophils Absolute 12/06/2023 0.1  0.0 - 0.1 K/uL Final   Immature Granulocytes 12/06/2023 1  % Final   Abs Immature Granulocytes 12/06/2023 0.07  0.00 - 0.07 K/uL Final   Performed at [facility], [address], [place], [place] [address]   Sodium 12/06/2023 139  135 - 145 mmol/L Final   Potassium 12/06/2023 4.2  3.5 - 5.1 mmol/L Final   Chloride 12/06/2023 101  98 - 111 mmol/L Final   CO2 12/06/2023 27  22 - 32 mmol/L Final   Glucose, Bld 12/06/2023 86  70 - 99 mg/dL Final   Glucose reference range applies only to samples taken after fasting for at least 8 hours.   BUN 12/06/2023 27 (H)  6 - 20 mg/dL Final   Creatinine, Ser 12/06/2023 1.54 (H)  0.61 - 1.24 mg/dL Final   Calcium 95/62/1308 9.8  8.9 - 10.3 mg/dL Final   Total Protein 65/78/4696 7.1  6.5 - 8.1 g/dL Final   Albumin 29/52/8413 4.2  3.5 - 5.0 g/dL Final   AST 24/40/1027 64 (H)  15 - 41 U/L Final   ALT 12/06/2023 118 (H)  0 - 44 U/L Final   Alkaline Phosphatase 12/06/2023 60  38 - 126 U/L Final   Total Bilirubin 12/06/2023 0.7  0.0 - 1.2 mg/dL Final   GFR, Estimated 12/06/2023 57 (L)  >60 mL/min Final   Comment: (NOTE) Calculated using the CKD-EPI Creatinine Equation (2021)    Anion gap 12/06/2023 11  5 - 15 Final   Performed at [facility], [address] [place], [place] [address]   Alcohol, Ethyl (B) 12/06/2023 <15  <15 mg/dL Final   Comment: (NOTE) For medical purposes only. Performed at [facility], [address], [place], [place] [address]    TSH 12/06/2023 0.647  0.350 - 4.500 uIU/mL Final   Comment: Performed by a 3rd Generation assay with a functional sensitivity of <=0.01 uIU/mL. Performed at [facility], [address], [place], [place] [address]    Glucose-Capillary 12/06/2023 117 (H)  70 - 99 mg/dL Final   Glucose reference range applies only to samples taken after fasting for at least 8 hours.  Admission on 11/25/2023, Discharged on 11/27/2023  Component Date Value Ref Range Status   Sodium 11/25/2023 138  135 - 145 mmol/L Final   Potassium 11/25/2023 3.8  3.5 - 5.1 mmol/L Final   Chloride 11/25/2023 105  98 - 111 mmol/L Final   CO2 11/25/2023 24  22 - 32 mmol/L Final   Glucose, Bld 11/25/2023 107 (H)  70 - 99 mg/dL Final   Glucose reference range applies only to samples taken after fasting for at least 8 hours.   BUN 11/25/2023 26 (H)  6 - 20 mg/dL Final   Creatinine, Ser 11/25/2023 1.38 (H)  0.61 - 1.24 mg/dL Final   Calcium 42/35/3614 9.1  8.9 - 10.3 mg/dL Final   Total Protein 43/15/4008 7.1  6.5 - 8.1 g/dL Final   Albumin 67/61/9509 3.9  3.5 - 5.0 g/dL Final   AST 32/67/1245 40  15 - 41 U/L Final   ALT 11/25/2023 26  0 - 44 U/L Final   Alkaline Phosphatase 11/25/2023 69  38 - 126 U/L Final   Total Bilirubin 11/25/2023 0.4  0.0 - 1.2 mg/dL Final   GFR, Estimated 11/25/2023 >60  >60 mL/min Final   Comment: (NOTE) Calculated using the CKD-EPI Creatinine Equation (2021)    Anion gap 11/25/2023 9  5 - 15 Final   Performed at [facility], [address] [place], [place] [address]   Alcohol, Ethyl (B) 11/25/2023 <15  <15 mg/dL Final   Comment: (NOTE) For medical purposes only. Performed at [facility], [address] [place],  [place] [address]    WBC 11/25/2023 10.5  4.0 - 10.5 K/uL Final   RBC 11/25/2023 5.00  4.22 - 5.81 MIL/uL Final   Hemoglobin 11/25/2023 13.3  13.0 - 17.0 g/dL Final   HCT 05/39/7673 42.4  39.0 - 52.0 % Final   MCV 11/25/2023 84.8  80.0 - 100.0 fL Final   MCH 11/25/2023 26.6  26.0 - 34.0 pg Final   MCHC 11/25/2023 31.4  30.0 - 36.0 g/dL Final   RDW 41/93/7902 13.4  11.5 - 15.5 % Final   Platelets 11/25/2023 275  150 - 400 K/uL Final   nRBC 11/25/2023 0.0  0.0 - 0.2 % Final   Performed at [facility], [address] [place], [place] [address]   Opiates 11/25/2023 NONE DETECTED  NONE DETECTED Final   Cocaine 11/25/2023 POSITIVE (A)  NONE DETECTED Final   Benzodiazepines 11/25/2023 NONE DETECTED  NONE DETECTED Final   Amphetamines 11/25/2023 NONE DETECTED  NONE DETECTED Final   Tetrahydrocannabinol 11/25/2023 NONE DETECTED  NONE DETECTED Final   Barbiturates 11/25/2023 NONE DETECTED  NONE DETECTED Final   Comment: (NOTE) DRUG SCREEN FOR MEDICAL PURPOSES ONLY.  IF CONFIRMATION IS NEEDED FOR ANY PURPOSE, NOTIFY LAB WITHIN 5 DAYS.  LOWEST DETECTABLE LIMITS FOR URINE DRUG SCREEN Drug Class                     Cutoff (ng/mL) Amphetamine and metabolites    1000 Barbiturate and metabolites    200 Benzodiazepine                 200 Opiates and metabolites        300 Cocaine and metabolites  300 THC                            50 Performed at [facility], [address], [place], [place] [address]   Admission on 11/02/2023, Discharged on 11/02/2023  Component Date Value Ref Range Status   Sodium 11/02/2023 137  135 - 145 mmol/L Final   Potassium 11/02/2023 3.6  3.5 - 5.1 mmol/L Final   Chloride 11/02/2023 101  98 - 111 mmol/L Final   CO2 11/02/2023 25  22 - 32 mmol/L Final   Glucose, Bld 11/02/2023 75  70 - 99 mg/dL Final   Glucose reference range applies only to samples taken after fasting for at least 8 hours.   BUN 11/02/2023 28 (H)  6 - 20  mg/dL Final   Creatinine, Ser 11/02/2023 1.21  0.61 - 1.24 mg/dL Final   Calcium 60/63/0160 9.4  8.9 - 10.3 mg/dL Final   Total Protein 10/93/2355 7.3  6.5 - 8.1 g/dL Final   Albumin 73/22/0254 4.2  3.5 - 5.0 g/dL Final   AST 27/12/2374 51 (H)  15 - 41 U/L Final   ALT 11/02/2023 31  0 - 44 U/L Final   Alkaline Phosphatase 11/02/2023 52  38 - 126 U/L Final   Total Bilirubin 11/02/2023 1.2  0.0 - 1.2 mg/dL Final   GFR, Estimated 11/02/2023 >60  >60 mL/min Final   Comment: (NOTE) Calculated using the CKD-EPI Creatinine Equation (2021)    Anion gap 11/02/2023 11  5 - 15 Final   Performed at [facility], [address], [place], [place] [address]   Alcohol, Ethyl (B) 11/02/2023 <15  <15 mg/dL Final   Comment: Please note change in reference range. (NOTE) For medical purposes only. Performed at [facility], [address] [place], [place] [address]    Opiates 11/02/2023 NONE DETECTED  NONE DETECTED Final   Cocaine 11/02/2023 POSITIVE (A)  NONE DETECTED Final   Benzodiazepines 11/02/2023 NONE DETECTED  NONE DETECTED Final   Amphetamines 11/02/2023 NONE DETECTED  NONE DETECTED Final   Tetrahydrocannabinol 11/02/2023 POSITIVE (A)  NONE DETECTED Final   Barbiturates 11/02/2023 NONE DETECTED  NONE DETECTED Final   Comment: (NOTE) DRUG SCREEN FOR MEDICAL PURPOSES ONLY.  IF CONFIRMATION IS NEEDED FOR ANY PURPOSE, NOTIFY LAB WITHIN 5 DAYS.  LOWEST DETECTABLE LIMITS FOR URINE DRUG SCREEN Drug Class                     Cutoff (ng/mL) Amphetamine and metabolites    1000 Barbiturate and metabolites    200 Benzodiazepine                 200 Opiates and metabolites        300 Cocaine and metabolites        300 THC                            50 Performed at [facility], [address] [place], [place] [address]    WBC 11/02/2023 10.6 (H)  4.0 - 10.5 K/uL Final   RBC 11/02/2023 5.08  4.22 - 5.81 MIL/uL Final   Hemoglobin  11/02/2023 13.5  13.0 - 17.0 g/dL Final   HCT 12/27/9483 42.6  39.0 - 52.0 % Final   MCV 11/02/2023 83.9  80.0 - 100.0 fL Final   MCH 11/02/2023 26.6  26.0 -  34.0 pg Final   MCHC 11/02/2023 31.7  30.0 - 36.0 g/dL Final   RDW 19/14/7829 13.5  11.5 - 15.5 % Final   Platelets 11/02/2023 203  150 - 400 K/uL Final   nRBC 11/02/2023 0.0  0.0 - 0.2 % Final   Neutrophils Relative % 11/02/2023 59  % Final   Neutro Abs 11/02/2023 6.3  1.7 - 7.7 K/uL Final   Lymphocytes Relative 11/02/2023 20  % Final   Lymphs Abs 11/02/2023 2.1  0.7 - 4.0 K/uL Final   Monocytes Relative 11/02/2023 13  % Final   Monocytes Absolute 11/02/2023 1.4 (H)  0.1 - 1.0 K/uL Final   Eosinophils Relative 11/02/2023 6  % Final   Eosinophils Absolute 11/02/2023 0.7 (H)  0.0 - 0.5 K/uL Final   Basophils Relative 11/02/2023 1  % Final   Basophils Absolute 11/02/2023 0.1  0.0 - 0.1 K/uL Final   Immature Granulocytes 11/02/2023 1  % Final   Abs Immature Granulocytes 11/02/2023 0.07  0.00 - 0.07 K/uL Final   Performed at [facility], [address], [place], [place] [address]  Admission on 10/25/2023, Discharged on 10/31/2023  Component Date Value Ref Range Status   Sodium 10/27/2023 140  135 - 145 mmol/L Final   Potassium 10/27/2023 4.0  3.5 - 5.1 mmol/L Final   Chloride 10/27/2023 108  98 - 111 mmol/L Final   CO2 10/27/2023 23  22 - 32 mmol/L Final   Glucose, Bld 10/27/2023 95  70 - 99 mg/dL Final   Glucose reference range applies only to samples taken after fasting for at least 8 hours.   BUN 10/27/2023 19  6 - 20 mg/dL Final   Creatinine, Ser 10/27/2023 1.14  0.61 - 1.24 mg/dL Final   Calcium 08/65/7846 8.8 (L)  8.9 - 10.3 mg/dL Final   Total Protein 96/29/5284 6.0 (L)  6.5 - 8.1 g/dL Final   Albumin 13/24/4010 3.4 (L)  3.5 - 5.0 g/dL Final   AST 27/25/3664 25  15 - 41 U/L Final   ALT 10/27/2023 27  0 - 44 U/L Final   Alkaline Phosphatase 10/27/2023 61  38 - 126 U/L Final   Total Bilirubin 10/27/2023  0.3  0.0 - 1.2 mg/dL Final   GFR, Estimated 10/27/2023 >60  >60 mL/min Final   Comment: (NOTE) Calculated using the CKD-EPI Creatinine Equation (2021)    Anion gap 10/27/2023 9  5 - 15 Final   Performed at [facility], [address] [place], [place] [address]   TSH 10/27/2023 0.962  0.350 - 4.500 uIU/mL Final   Comment: Performed by a 3rd Generation assay with a functional sensitivity of <=0.01 uIU/mL. Performed at [facility], [address] [place], [place] [address]    Hgb A1c MFr Bld 10/27/2023 5.0  4.8 - 5.6 % Final   Comment: (NOTE) Pre diabetes:          5.7%-6.4%  Diabetes:              >6.4%  Glycemic control for   <7.0% adults with diabetes    Mean Plasma Glucose 10/27/2023 96.8  mg/dL Final   Performed at [facility], [address], [place], [place] [address]  Admission on 10/24/2023, Discharged on 10/25/2023  Component Date Value Ref Range Status   POC Amphetamine UR 10/24/2023 None Detected  NONE DETECTED (Cut Off Level 1000 ng/mL) Final   POC Secobarbital (BAR) 10/24/2023 None Detected  NONE DETECTED (Cut  Off Level 300 ng/mL) Final   POC Buprenorphine (BUP) 10/24/2023 None Detected  NONE DETECTED (Cut Off Level 10 ng/mL) Final   POC Oxazepam (BZO) 10/24/2023 None Detected  NONE DETECTED (Cut Off Level 300 ng/mL) Final   POC Cocaine UR 10/24/2023 Positive (A)  NONE DETECTED (Cut Off Level 300 ng/mL) Final   POC Methamphetamine UR 10/24/2023 None Detected  NONE DETECTED (Cut Off Level 1000 ng/mL) Final   POC Morphine 10/24/2023 None Detected  NONE DETECTED (Cut Off Level 300 ng/mL) Final   POC Methadone UR 10/24/2023 None Detected  NONE DETECTED (Cut Off Level 300 ng/mL) Final   POC Oxycodone UR 10/24/2023 None Detected  NONE DETECTED (Cut Off Level 100 ng/mL) Final   POC Marijuana UR 10/24/2023 None Detected  NONE DETECTED (Cut Off Level 50 ng/mL) Final  Admission on 10/19/2023, Discharged on 10/22/2023  Component Date  Value Ref Range Status   WBC 10/19/2023 10.8 (H)  4.0 - 10.5 K/uL Final   RBC 10/19/2023 5.59  4.22 - 5.81 MIL/uL Final   Hemoglobin 10/19/2023 14.6  13.0 - 17.0 g/dL Final   HCT 40/98/1191 46.1  39.0 - 52.0 % Final   MCV 10/19/2023 82.5  80.0 - 100.0 fL Final   MCH 10/19/2023 26.1  26.0 - 34.0 pg Final   MCHC 10/19/2023 31.7  30.0 - 36.0 g/dL Final   RDW 47/82/9562 13.2  11.5 - 15.5 % Final   Platelets 10/19/2023 275  150 - 400 K/uL Final   nRBC 10/19/2023 0.0  0.0 - 0.2 % Final   Neutrophils Relative % 10/19/2023 62  % Final   Neutro Abs 10/19/2023 6.8  1.7 - 7.7 K/uL Final   Lymphocytes Relative 10/19/2023 21  % Final   Lymphs Abs 10/19/2023 2.3  0.7 - 4.0 K/uL Final   Monocytes Relative 10/19/2023 10  % Final   Monocytes Absolute 10/19/2023 1.0  0.1 - 1.0 K/uL Final   Eosinophils Relative 10/19/2023 5  % Final   Eosinophils Absolute 10/19/2023 0.6 (H)  0.0 - 0.5 K/uL Final   Basophils Relative 10/19/2023 1  % Final   Basophils Absolute 10/19/2023 0.1  0.0 - 0.1 K/uL Final   Immature Granulocytes 10/19/2023 1  % Final   Abs Immature Granulocytes 10/19/2023 0.10 (H)  0.00 - 0.07 K/uL Final   Performed at [facility], [address] [place], [place] [address]   Sodium 10/19/2023 140  135 - 145 mmol/L Final   Potassium 10/19/2023 4.8  3.5 - 5.1 mmol/L Final   Chloride 10/19/2023 100  98 - 111 mmol/L Final   CO2 10/19/2023 28  22 - 32 mmol/L Final   Glucose, Bld 10/19/2023 81  70 - 99 mg/dL Final   Glucose reference range applies only to samples taken after fasting for at least 8 hours.   BUN 10/19/2023 15  6 - 20 mg/dL Final   Creatinine, Ser 10/19/2023 1.19  0.61 - 1.24 mg/dL Final   Calcium 57/84/6962 10.6 (H)  8.9 - 10.3 mg/dL Final   Total Protein 95/28/4132 7.0  6.5 - 8.1 g/dL Final   Albumin 44/07/270 4.3  3.5 - 5.0 g/dL Final   AST 53/66/4403 117 (H)  15 - 41 U/L Final   ALT 10/19/2023 74 (H)  0 - 44 U/L Final   Alkaline Phosphatase 10/19/2023 55  38 - 126 U/L  Final   Total Bilirubin 10/19/2023 0.9  0.0 - 1.2 mg/dL Final   GFR, Estimated 10/19/2023 >60  >60 mL/min  Final   Comment: (NOTE) Calculated using the CKD-EPI Creatinine Equation (2021)    Anion gap 10/19/2023 12  5 - 15 Final   Performed at [facility], [address] [place], [place] [address]   Cholesterol 10/19/2023 152  0 - 200 mg/dL Final   Triglycerides 91/47/8295 144  <150 mg/dL Final   HDL 62/13/0865 57  >40 mg/dL Final   Total CHOL/HDL Ratio 10/19/2023 2.7  RATIO Final   VLDL 10/19/2023 29  0 - 40 mg/dL Final   LDL Cholesterol 10/19/2023 66  0 - 99 mg/dL Final   Comment:        Total Cholesterol/HDL:CHD Risk Coronary Heart Disease Risk Table                     Men   Women  1/2 Average Risk   3.4   3.3  Average Risk       5.0   4.4  2 X Average Risk   9.6   7.1  3 X Average Risk  23.4   11.0        Use the calculated Patient Ratio above and the CHD Risk Table to determine the patient's CHD Risk.        ATP III CLASSIFICATION (LDL):  <100     mg/dL   Optimal  784-696  mg/dL   Near or Above                    Optimal  130-159  mg/dL   Borderline  295-284  mg/dL   High  >132     mg/dL   Very High Performed at [facility], [address] [place], [place] [address]    Magnesium  10/19/2023 2.2  1.7 - 2.4 mg/dL Final   Performed at [facility], [address], [place], [place] [address]   POC Amphetamine UR 10/20/2023 None Detected  NONE DETECTED (Cut Off Level 1000 ng/mL) Final   POC Secobarbital (BAR) 10/20/2023 None Detected  NONE DETECTED (Cut Off Level 300 ng/mL) Final   POC Buprenorphine (BUP) 10/20/2023 None Detected  NONE DETECTED (Cut Off Level 10 ng/mL) Final   POC Oxazepam (BZO) 10/20/2023 None Detected  NONE DETECTED (Cut Off Level 300 ng/mL) Final   POC Cocaine UR 10/20/2023 None Detected  NONE DETECTED (Cut Off Level 300 ng/mL) Final   POC Methamphetamine UR 10/20/2023 None Detected  NONE DETECTED (Cut Off Level 1000 ng/mL) Final    POC Morphine 10/20/2023 None Detected  NONE DETECTED (Cut Off Level 300 ng/mL) Final   POC Methadone UR 10/20/2023 None Detected  NONE DETECTED (Cut Off Level 300 ng/mL) Final   POC Oxycodone UR 10/20/2023 None Detected  NONE DETECTED (Cut Off Level 100 ng/mL) Final   POC Marijuana UR 10/20/2023 None Detected  NONE DETECTED (Cut Off Level 50 ng/mL) Final   SARS Coronavirus 2 by RT PCR 10/20/2023 NEGATIVE  NEGATIVE Final   Performed at [facility] Lab, [address], [place], [place] [address]    Allergies: Patient has no known allergies.  Medications:  Facility Ordered Medications  Medication   acetaminophen  (TYLENOL ) tablet 650 mg   alum & mag hydroxide-simeth (MAALOX/MYLANTA) 200-200-20 MG/5ML suspension 30 mL   magnesium  hydroxide (MILK OF MAGNESIA) suspension 30 mL   haloperidol  (HALDOL ) tablet 5 mg   And   diphenhydrAMINE  (BENADRYL ) capsule 50 mg   haloperidol  lactate (HALDOL ) injection 5 mg   And  diphenhydrAMINE  (BENADRYL ) injection 50 mg   And   LORazepam  (ATIVAN ) injection 2 mg   haloperidol  lactate (HALDOL ) injection 10 mg   And   diphenhydrAMINE  (BENADRYL ) injection 50 mg   And   LORazepam  (ATIVAN ) injection 2 mg   hydrOXYzine  (ATARAX ) tablet 25 mg   cyclobenzaprine  (FLEXERIL ) tablet 5 mg   gabapentin  (NEURONTIN ) capsule 300 mg   OLANZapine  (ZYPREXA ) tablet 10 mg   [START ON 12/07/2023] amLODipine  (NORVASC ) tablet 10 mg   PTA Medications  Medication Sig   cyclobenzaprine  (FLEXERIL ) 10 MG tablet Take 0.5-1 tablets (5-10 mg total) by mouth at bedtime as needed for muscle spasms. (Patient not taking: Reported on 11/15/2023)   amLODipine  (NORVASC ) 10 MG tablet Take 1 tablet (10 mg total) by mouth daily. (Patient not taking: Reported on 11/25/2023)   hydrOXYzine  (ATARAX ) 25 MG tablet Take 1 tablet (25 mg total) by mouth 3 (three) times daily as needed for anxiety. (Patient not taking: Reported on 11/25/2023)   OLANZapine  (ZYPREXA ) 10 MG tablet Take 1 tablet (10 mg) in  the morning daily and 2 tablets (20 mg) every night (Patient not taking: Reported on 11/25/2023)   traZODone  (DESYREL ) 50 MG tablet Take 1 tablet (50 mg total) by mouth at bedtime as needed for sleep. (Patient not taking: Reported on 11/25/2023)   gabapentin  (NEURONTIN ) 300 MG capsule Take 1 capsule (300 mg total) by mouth 3 (three) times daily. (Patient not taking: Reported on 11/25/2023)      Medical Decision Making  Based on this evaluation, patient is recommended for inpatient psychiatric hospitalization for mood stabilization and safety.  Patient continues to report suicidal ideations and also appears psychotic at this time due to medication noncompliance and substance use.  Patient is currently voluntary and agrees to this plan.  Treatment plan: - Admit to continuous assessment until appropriate inpatient bed is found. - Agitation protocol ordered per policy. - Home medications continued: Meds ordered this encounter  Medications   acetaminophen  (TYLENOL ) tablet 650 mg   alum & mag hydroxide-simeth (MAALOX/MYLANTA) 200-200-20 MG/5ML suspension 30 mL   magnesium  hydroxide (MILK OF MAGNESIA) suspension 30 mL   AND Linked Order Group    haloperidol  (HALDOL ) tablet 5 mg    diphenhydrAMINE  (BENADRYL ) capsule 50 mg   AND Linked Order Group    haloperidol  lactate (HALDOL ) injection 5 mg    diphenhydrAMINE  (BENADRYL ) injection 50 mg    LORazepam  (ATIVAN ) injection 2 mg   AND Linked Order Group    haloperidol  lactate (HALDOL ) injection 10 mg    diphenhydrAMINE  (BENADRYL ) injection 50 mg    LORazepam  (ATIVAN ) injection 2 mg   hydrOXYzine  (ATARAX ) tablet 25 mg   cyclobenzaprine  (FLEXERIL ) tablet 5 mg   gabapentin  (NEURONTIN ) capsule 300 mg   OLANZapine  (ZYPREXA ) tablet 10 mg   amLODipine  (NORVASC ) tablet 10 mg   - Labs, EKG and UDS ordered to rule out medical concerns. Lab Orders         CBC with Differential/Platelet         Comprehensive metabolic panel         Ethanol         TSH          Glucose, capillary         POCT Urine Drug Screen - (I-Screen)     EKG  Recommendations  Based on my evaluation the patient does not appear to have an emergency medical condition.  Davia Erps, NP 12/06/23  4:10 PM

## 2023-12-06 NOTE — Progress Notes (Signed)
   12/06/23 1119  [facility] Triage Screening (Walk-ins at [facility] only)  How Did You Hear About Us ? Self  What Is the Reason for Your Visit/Call Today? Aaron Rubio presents to [facility] voluntarily unaccompanied. Pt states that he feels like he needs to be admitted and connected to the behavioral health hospital. Pt states that he is homeless and knew around 9 this morning that he needed to be at the hospital. Pt is uncooperative and not willing to answer traige questions. Pt is rambling about being kidnapped and tortured with a game of Publishing copy.  How Long Has This Been Causing You Problems? <Week  Have You Recently Had Any Thoughts About Hurting Yourself? Yes  How long ago did you have thoughts about hurting yourself? this morning  Are You Planning to Commit Suicide/Harm Yourself At This time?  (UTA)  Have you Recently Had Thoughts About Hurting Someone Else?  (UTA)  Are You Planning To Harm Someone At This Time?  (UTA)  Physical Abuse  (UTA)  Verbal Abuse  (UTA)  Sexual Abuse  (UTA)  Exploitation of patient/patient's resources  (UTA)  Self-Neglect  (UTA)  Possible abuse reported to:  (UTA)  Are you currently experiencing any auditory, visual or other hallucinations?  (UTA)  Have You Used Any Alcohol or Drugs in the Past 24 Hours?  (UTA)  Do you have any current medical co-morbidities that require immediate attention?  (UTA)  Clinician description of patient physical appearance/behavior: uncooperative, disorganized thoughts, appears to be responding to internal stimuli  What Do You Feel Would Help You the Most Today?  (UTA)  If access to [facility] Urgent Care was not available, would you have sought care in the Emergency Department?  (UTA)  Determination of Need Urgent (48 hours)  Options For Referral Medication Management;BH Urgent Care;Outpatient Therapy  Determination of Need filed? Yes

## 2023-12-06 NOTE — ED Notes (Signed)
 Pt sleeping in recliner, denies HI/AVH, reports passive SI. Contracts for safety. Flat affect, does not to engage with staff. No noted distress. Will monitor for safety

## 2023-12-07 DIAGNOSIS — R45851 Suicidal ideations: Secondary | ICD-10-CM | POA: Diagnosis not present

## 2023-12-07 DIAGNOSIS — F200 Paranoid schizophrenia: Secondary | ICD-10-CM | POA: Diagnosis not present

## 2023-12-07 LAB — POCT URINE DRUG SCREEN - MANUAL ENTRY (I-SCREEN)
POC Amphetamine UR: NOT DETECTED
POC Buprenorphine (BUP): NOT DETECTED
POC Cocaine UR: POSITIVE — AB
POC Marijuana UR: POSITIVE — AB
POC Methadone UR: NOT DETECTED
POC Methamphetamine UR: NOT DETECTED
POC Morphine: NOT DETECTED
POC Oxazepam (BZO): NOT DETECTED
POC Oxycodone UR: NOT DETECTED
POC Secobarbital (BAR): NOT DETECTED

## 2023-12-07 NOTE — ED Notes (Signed)
 Patient awake, sitting in lounger eating breakfast.

## 2023-12-07 NOTE — ED Notes (Signed)
 Pt observed/assessed in recliner sleeping. RR even and unlabored, appearing in no noted distress. Environmental check complete, will continue to monitor for safety

## 2023-12-07 NOTE — ED Provider Notes (Signed)
 FBC/OBS ASAP Discharge Summary  Date and Time: 12/07/2023 9:44 AM  Name: Aaron Rubio  MRN:  [id number]   Discharge Diagnoses:  Final diagnoses:  Paranoid schizophrenia (HCC)  Suicidal ideation    Subjective: Aaron Rubio 43 y.o., male patient presented to [facility] as a voluntary walk in with complaints of suicidal ideations and psychotic symptoms. He has PPH of paranoid schizophrenia and polysubstance use.Aaron Rubio, is seen face to face by this provider, consulted with Dr. Docia Freeman; and chart reviewed on 12/07/23.  On evaluation Aaron Rubio reports ongoing suicidal ideations without intent while in hospital setting. He does appear more logical in his thought processing and is able to better engage in the assessment today without constant rambling and mumbling throughout today's assessment. Pt states that he continues to struggle with substance use and is aware that this is likely contributing to his acute psychotic episodes/ paranoia. He also states that when he is stable and discharged from the hospital, he is unable to afford his prescribed medications which leads him to use other substances. We discussed going to the [facility] in [place] after discharge to help with resources to manage his health, including helping to cover medication costs. He is open to this. He reports sleeping well last night and has  a good appetite. He denies any medical compliant or acute pain. He is compliant with medications and denies having any concerns. Discussed recommendation for inpatient treatment and that he has been accepted to [facility], pending transport.   During evaluation Aaron Rubio is laying in bed asleep,  in no acute distress.  He is alert & oriented x 4, calm, cooperative and attentive for this assessment.  His mood is depressed with congruent flat affect.  He has normal speech, and behavior.  Pt does not appear to be responding to internal or external stimuli.  He does  continue to endorse having some paranoia as he often looks around the room during assessment. Patient is able to converse more logically and coherently than yesterday. There is some ongoing preoccupation observed. He continues to endorse ongoing suicidal ideations without plan and paranoia.  He currently denies homicidal ideation and hallucinations. Patient answered question appropriately.    Stay Summary: 12/06/2023   Aaron Peters Sockwell 43 y.o., male patient presented to [facility] as a voluntary walk in unaccompanied with complaints of suicidal ideation and psychotic symptoms. He has a past psychiatric history of Paranoid Schizophrenia and polysubstance use.  Pertinent medical history including hypertension.  Patient was recently discharged from [facility] and does not have an outpatient psychiatrist or therapist. Aaron Rubio, is seen face to face by this provider, consulted with Dr. Docia Freeman; and chart reviewed on 12/06/23. During evaluation Aaron Rubio is sitting in assessment room talking to himself, in no acute distress.  He is alert & oriented x 4, calm, cooperative but very distracted for this assessment.  His mood is depressed and dysphoric with congruent flat affect.  He has normal speech, however lacks eye contact and will only look at writer from the corner of his eye.  Objectively there is evidence of psychosis such as responding to internal stimuli, disorganized and scattered thoughts, paranoid delusions, thought preoccupation and illogical rambling.  Patient is able to converse at times however he requires redirection to answer assessment questions appropriately. Pt continues to verbalize suicidal ideations and denies current plans but states "I did try to hang before". He currently denies homicidal ideation but then states "  I don't want to take no life, just someone's dog". He denies auditory hallucinations, however is observed responding to internal stimuli. He does not provide an answer  about visual hallucinations.  Patient reports using alcohol, cocaine and mushrooms on Monday and reports only using it when he has the money to but does not use either of these consistently.  He denies withdrawal symptoms.  He denies any medical/physical complaints or acute pain.   Throughout assessment patient is very disorganized and rambling illogical statements.  Patient is very paranoid and constantly states "people just like to play games with me, people do not know what they are doing, they just want a sell hand-held devices".  Patient makes comments about living in [place]  and [place]  but is unclear about living or how long he has been here in [place] .  When asked about access to firearms patient states "the FBI is taking over and [employer] is helping the ambulance driver but do not really help except to sell those handheld devices".  Patient is heard mumbling to himself throughout the assessment making statements about "Twitter, Facebook, the government being on a global network and having a contract that is all over ".  Patient reports poor sleep and states that he never sleeps because this to me people around him as he is currently homeless.  Patient reports that he was receiving some sort of supplemental income but states "they said they lost my information so they took my money for Medicaid and I have been trying to get it back but they have the own company and they keep plan around and they will know what they are doing ".  We discussed the plan for patient to receive inpatient psychiatric hospitalization for treatment and medication management. Pt agreeable to this plan.   Total Time spent with patient: 20 minutes  Past Psychiatric History: Schizophrenia and polysubstance use. Prior inpateint hospitalizations at [facility] and [facility] 3 times in the past 2 months.  Past Medical History: HTN Family History: No family history on file.  Family Psychiatric History: None reported Social  History: Patient reports that he is currently homeless and unemployed.  He endorses using alcohol, cocaine and mushrooms occasionally (last use was Monday)  Tobacco Cessation:  N/A, patient does not currently use tobacco products  Current Medications:  Current Facility-Administered Medications  Medication Dose Route Frequency Provider Last Rate Last Admin   acetaminophen  (TYLENOL ) tablet 650 mg  650 mg Oral Q6H PRN Gasper Hopes C, NP       alum & mag hydroxide-simeth (MAALOX/MYLANTA) 200-200-20 MG/5ML suspension 30 mL  30 mL Oral Q4H PRN Yannick Steuber C, NP       amLODipine  (NORVASC ) tablet 10 mg  10 mg Oral Daily Raidyn Wassink C, NP   10 mg at 12/07/23 0454   cyclobenzaprine  (FLEXERIL ) tablet 5 mg  5 mg Oral QHS PRN Florette Thai C, NP       haloperidol  (HALDOL ) tablet 5 mg  5 mg Oral TID PRN Yasseen Salls C, NP       And   diphenhydrAMINE  (BENADRYL ) capsule 50 mg  50 mg Oral TID PRN Suhailah Kwan C, NP       haloperidol  lactate (HALDOL ) injection 5 mg  5 mg Intramuscular TID PRN Jovonte Commins C, NP       And   diphenhydrAMINE  (BENADRYL ) injection 50 mg  50 mg Intramuscular TID PRN Reona Zendejas C, NP       And   LORazepam  (  ATIVAN ) injection 2 mg  2 mg Intramuscular TID PRN Pamalee Marcoe C, NP       haloperidol  lactate (HALDOL ) injection 10 mg  10 mg Intramuscular TID PRN Davia Erps, NP       And   diphenhydrAMINE  (BENADRYL ) injection 50 mg  50 mg Intramuscular TID PRN Tiersa Dayley C, NP       And   LORazepam  (ATIVAN ) injection 2 mg  2 mg Intramuscular TID PRN Adham Johnson C, NP       gabapentin  (NEURONTIN ) capsule 300 mg  300 mg Oral TID Maelee Hoot C, NP   300 mg at 12/07/23 1610   hydrOXYzine  (ATARAX ) tablet 25 mg  25 mg Oral TID PRN Alysse Rathe C, NP       magnesium  hydroxide (MILK OF MAGNESIA) suspension 30 mL  30 mL Oral Daily PRN Kortnie Stovall C, NP       OLANZapine  (ZYPREXA ) tablet 10 mg  10 mg Oral BID Kiira Brach C, NP   10 mg at 12/07/23 9604   Current Outpatient  Medications  Medication Sig Dispense Refill   amLODipine  (NORVASC ) 10 MG tablet Take 1 tablet (10 mg total) by mouth daily. (Patient not taking: Reported on 11/25/2023) 30 tablet 0   cyclobenzaprine  (FLEXERIL ) 10 MG tablet Take 0.5-1 tablets (5-10 mg total) by mouth at bedtime as needed for muscle spasms. (Patient not taking: Reported on 11/15/2023) 7 tablet 0   gabapentin  (NEURONTIN ) 300 MG capsule Take 1 capsule (300 mg total) by mouth 3 (three) times daily. (Patient not taking: Reported on 11/25/2023) 90 capsule 0   hydrOXYzine  (ATARAX ) 25 MG tablet Take 1 tablet (25 mg total) by mouth 3 (three) times daily as needed for anxiety. (Patient not taking: Reported on 11/25/2023) 60 tablet 0   OLANZapine  (ZYPREXA ) 10 MG tablet Take 1 tablet (10 mg) in the morning daily and 2 tablets (20 mg) every night (Patient not taking: Reported on 11/25/2023) 90 tablet 0   traZODone  (DESYREL ) 50 MG tablet Take 1 tablet (50 mg total) by mouth at bedtime as needed for sleep. (Patient not taking: Reported on 11/25/2023) 30 tablet 0    PTA Medications:  Facility Ordered Medications  Medication   acetaminophen  (TYLENOL ) tablet 650 mg   alum & mag hydroxide-simeth (MAALOX/MYLANTA) 200-200-20 MG/5ML suspension 30 mL   magnesium  hydroxide (MILK OF MAGNESIA) suspension 30 mL   haloperidol  (HALDOL ) tablet 5 mg   And   diphenhydrAMINE  (BENADRYL ) capsule 50 mg   haloperidol  lactate (HALDOL ) injection 5 mg   And   diphenhydrAMINE  (BENADRYL ) injection 50 mg   And   LORazepam  (ATIVAN ) injection 2 mg   haloperidol  lactate (HALDOL ) injection 10 mg   And   diphenhydrAMINE  (BENADRYL ) injection 50 mg   And   LORazepam  (ATIVAN ) injection 2 mg   hydrOXYzine  (ATARAX ) tablet 25 mg   cyclobenzaprine  (FLEXERIL ) tablet 5 mg   gabapentin  (NEURONTIN ) capsule 300 mg   OLANZapine  (ZYPREXA ) tablet 10 mg   amLODipine  (NORVASC ) tablet 10 mg   PTA Medications  Medication Sig   cyclobenzaprine  (FLEXERIL ) 10 MG tablet Take 0.5-1 tablets  (5-10 mg total) by mouth at bedtime as needed for muscle spasms. (Patient not taking: Reported on 11/15/2023)   amLODipine  (NORVASC ) 10 MG tablet Take 1 tablet (10 mg total) by mouth daily. (Patient not taking: Reported on 11/25/2023)   hydrOXYzine  (ATARAX ) 25 MG tablet Take 1 tablet (25 mg total) by mouth 3 (three) times daily as needed for anxiety. (Patient not taking:  Reported on 11/25/2023)   OLANZapine  (ZYPREXA ) 10 MG tablet Take 1 tablet (10 mg) in the morning daily and 2 tablets (20 mg) every night (Patient not taking: Reported on 11/25/2023)   traZODone  (DESYREL ) 50 MG tablet Take 1 tablet (50 mg total) by mouth at bedtime as needed for sleep. (Patient not taking: Reported on 11/25/2023)   gabapentin  (NEURONTIN ) 300 MG capsule Take 1 capsule (300 mg total) by mouth 3 (three) times daily. (Patient not taking: Reported on 11/25/2023)        No data to display          Flowsheet Row ED from 12/06/2023 in [facility] ED from 11/25/2023 in [facility] Emergency Department at [facility] ED from 11/14/2023 in [facility]  C-SSRS RISK CATEGORY No Risk High Risk High Risk       Musculoskeletal  Strength & Muscle Tone: within normal limits Gait & Station: normal Patient leans: N/A  Psychiatric Specialty Exam  Presentation  General Appearance:  Appropriate for Environment  Eye Contact: Good  Speech: Clear and Coherent  Speech Volume: Normal  Handedness: Right   Mood and Affect  Mood: Dysphoric  Affect: Appropriate; Flat   Thought Process  Thought Processes: Coherent  Descriptions of Associations:Circumstantial  Orientation:Full (Time, Place and Person)  Thought Content:Paranoid Ideation  Diagnosis of Schizophrenia or Schizoaffective disorder in past: Yes  Duration of Psychotic Symptoms: Greater than six months   Hallucinations:Hallucinations: None  Ideas of Reference:None  Suicidal  Thoughts:Suicidal Thoughts: No SI Passive Intent and/or Plan: Without Plan  Homicidal Thoughts:Homicidal Thoughts: No   Sensorium  Memory: Immediate Good; Recent Good  Judgment: Fair  Insight: Fair   Chartered certified accountant: Fair  Attention Span: Fair  Recall: [employer] of Knowledge: Fair  Language: Fair   Psychomotor Activity  Psychomotor Activity: Psychomotor Activity: Normal   Assets  Assets: Desire for Improvement; Housing; Physical Health; Social Support; Vocational/Educational   Sleep  Sleep: Sleep: Good Number of Hours of Sleep: 8   Nutritional Assessment (For OBS and FBC admissions only) Has the patient had a weight loss or gain of 10 pounds or more in the last 3 months?: No Has the patient had a decrease in food intake/or appetite?: No Does the patient have dental problems?: No Does the patient have eating habits or behaviors that may be indicators of an eating disorder including binging or inducing vomiting?: No Has the patient recently lost weight without trying?: 0 Has the patient been eating poorly because of a decreased appetite?: 0 Malnutrition Screening Tool Score: 0    Physical Exam  Physical Exam Vitals and nursing note reviewed.  Constitutional:      Appearance: Normal appearance.  HENT:     Head: Normocephalic.     Nose: Nose normal.  Eyes:     Extraocular Movements: Extraocular movements intact.  Cardiovascular:     Rate and Rhythm: Normal rate.  Pulmonary:     Effort: Pulmonary effort is normal.  Musculoskeletal:        General: Normal range of motion.     Cervical back: Normal range of motion.  Neurological:     General: No focal deficit present.     Mental Status: He is alert and oriented to person, place, and time.   Review of Systems  Constitutional: Negative.   HENT: Negative.    Eyes: Negative.   Respiratory: Negative.    Cardiovascular: Negative.   Gastrointestinal: Negative.  Genitourinary: Negative.   Musculoskeletal: Negative.   Neurological: Negative.   Endo/Heme/Allergies: Negative.   Psychiatric/Behavioral:  Positive for depression and suicidal ideas.    Blood pressure (!) 131/94, pulse 84, temperature 97.8 F (36.6 C), resp. rate 18, SpO2 100%. There is no height or weight on file to calculate BMI.   Plan Of Care/Follow-up recommendations:  Pt is recommended for inpatient psychiatric treatment for mood stabilization and safety. Pt is currently voluntary and agreeable to this plan. He is aware that he will be transported to [facility] today. No changes to current treatment plan.   Disposition: Old Risa Cheney, NP 12/07/2023, 9:44 AM

## 2023-12-07 NOTE — ED Notes (Signed)
 Patient discharged in no acute distress. Belongings returned from his secured locker. Patient transported by [facility] to [facility].

## 2023-12-07 NOTE — ED Notes (Signed)
 Report given to Tristtan Luckett, RN at [facility].

## 2023-12-07 NOTE — ED Notes (Signed)
 Patient A&Ox4. Has been calm, cooperative, and appropriate with peers. He denies intent to harm self/others. Denies A/VH. Patient denies any physical pain or discomfort. No acute distress observed. Routine safety checks conducted according to facility protocol. Patient agreed to notify staff should thoughts of harm toward self or others arise. We will continue to monitor for safety.

## 2023-12-07 NOTE — ED Notes (Signed)
 [facility] called to transport to [facility].

## 2023-12-14 ENCOUNTER — Ambulatory Visit (INDEPENDENT_AMBULATORY_CARE_PROVIDER_SITE_OTHER)
Admission: EM | Admit: 2023-12-14 | Discharge: 2023-12-15 | Disposition: A | Discharge status: Other Acute Inpatient Hospital | Source: Home / Self Care

## 2023-12-14 ENCOUNTER — Encounter (HOSPITAL_COMMUNITY): Payer: Self-pay

## 2023-12-14 ENCOUNTER — Emergency Department (HOSPITAL_COMMUNITY)
Admission: EM | Admit: 2023-12-14 | Discharge: 2023-12-14 | Disposition: A | Discharge status: Home / Self Care | Attending: Emergency Medicine | Admitting: Emergency Medicine

## 2023-12-14 ENCOUNTER — Other Ambulatory Visit: Payer: Self-pay

## 2023-12-14 ENCOUNTER — Emergency Department (HOSPITAL_COMMUNITY)

## 2023-12-14 DIAGNOSIS — Z79899 Other long term (current) drug therapy: Secondary | ICD-10-CM | POA: Diagnosis not present

## 2023-12-14 DIAGNOSIS — F1410 Cocaine abuse, uncomplicated: Secondary | ICD-10-CM | POA: Insufficient documentation

## 2023-12-14 DIAGNOSIS — F1290 Cannabis use, unspecified, uncomplicated: Secondary | ICD-10-CM | POA: Diagnosis not present

## 2023-12-14 DIAGNOSIS — M79671 Pain in right foot: Secondary | ICD-10-CM | POA: Diagnosis present

## 2023-12-14 DIAGNOSIS — Z5902 Unsheltered homelessness: Secondary | ICD-10-CM | POA: Insufficient documentation

## 2023-12-14 DIAGNOSIS — F17210 Nicotine dependence, cigarettes, uncomplicated: Secondary | ICD-10-CM | POA: Insufficient documentation

## 2023-12-14 DIAGNOSIS — F200 Paranoid schizophrenia: Secondary | ICD-10-CM | POA: Diagnosis present

## 2023-12-14 DIAGNOSIS — M79672 Pain in left foot: Secondary | ICD-10-CM | POA: Insufficient documentation

## 2023-12-14 DIAGNOSIS — R45851 Suicidal ideations: Secondary | ICD-10-CM | POA: Diagnosis present

## 2023-12-14 DIAGNOSIS — Z5900 Homelessness unspecified: Secondary | ICD-10-CM

## 2023-12-14 DIAGNOSIS — Z560 Unemployment, unspecified: Secondary | ICD-10-CM | POA: Insufficient documentation

## 2023-12-14 MED ORDER — KETOROLAC TROMETHAMINE 30 MG/ML IJ SOLN
30.0000 mg | Freq: Once | INTRAMUSCULAR | Status: AC
Start: 2023-12-14 — End: 2023-12-14
  Administered 2023-12-14: 30 mg via INTRAMUSCULAR
  Filled 2023-12-14: qty 1

## 2023-12-14 MED ORDER — METHOCARBAMOL 500 MG PO TABS
500.0000 mg | ORAL_TABLET | Freq: Once | ORAL | Status: AC
  Administered 2023-12-14: 500 mg via ORAL
  Filled 2023-12-14: qty 1

## 2023-12-14 MED ORDER — OLANZAPINE 10 MG PO TABS: 20.0000 mg | ORAL_TABLET | Freq: Every day | ORAL | Status: DC

## 2023-12-14 MED ORDER — AMLODIPINE BESYLATE 10 MG PO TABS
10.0000 mg | ORAL_TABLET | Freq: Every day | ORAL | Status: DC
  Administered 2023-12-15: 10 mg via ORAL
  Filled 2023-12-14: qty 1

## 2023-12-14 MED ORDER — DIPHENHYDRAMINE HCL 50 MG/ML IJ SOLN: 50.0000 mg | Freq: Three times a day (TID) | INTRAMUSCULAR | Status: DC | PRN

## 2023-12-14 MED ORDER — HALOPERIDOL LACTATE 5 MG/ML IJ SOLN: 10.0000 mg | Freq: Three times a day (TID) | INTRAMUSCULAR | Status: DC | PRN

## 2023-12-14 MED ORDER — DIPHENHYDRAMINE HCL 50 MG PO CAPS: 50.0000 mg | ORAL_CAPSULE | Freq: Three times a day (TID) | ORAL | Status: DC | PRN

## 2023-12-14 MED ORDER — TRAZODONE HCL 50 MG PO TABS: 50.0000 mg | ORAL_TABLET | Freq: Every evening | ORAL | Status: DC | PRN

## 2023-12-14 MED ORDER — ACETAMINOPHEN 325 MG PO TABS
650.0000 mg | ORAL_TABLET | Freq: Four times a day (QID) | ORAL | Status: DC | PRN
  Filled 2023-12-14: qty 2

## 2023-12-14 MED ORDER — MAGNESIUM HYDROXIDE 400 MG/5ML PO SUSP: 30.0000 mL | Freq: Every day | ORAL | Status: DC | PRN

## 2023-12-14 MED ORDER — HYDROXYZINE HCL 25 MG PO TABS: 25.0000 mg | ORAL_TABLET | Freq: Three times a day (TID) | ORAL | Status: DC | PRN

## 2023-12-14 MED ORDER — LORAZEPAM 2 MG/ML IJ SOLN: 2.0000 mg | Freq: Three times a day (TID) | INTRAMUSCULAR | Status: DC | PRN

## 2023-12-14 MED ORDER — LIDOCAINE 5 % EX PTCH
2.0000 | MEDICATED_PATCH | CUTANEOUS | Status: DC
  Administered 2023-12-14: 2 via TRANSDERMAL
  Filled 2023-12-14: qty 2

## 2023-12-14 MED ORDER — HALOPERIDOL 5 MG PO TABS: 5.0000 mg | ORAL_TABLET | Freq: Three times a day (TID) | ORAL | Status: DC | PRN

## 2023-12-14 MED ORDER — ACETAMINOPHEN 325 MG PO TABS
650.0000 mg | ORAL_TABLET | Freq: Once | ORAL | Status: AC
  Administered 2023-12-14: 650 mg via ORAL
  Filled 2023-12-14: qty 2

## 2023-12-14 MED ORDER — ALUM & MAG HYDROXIDE-SIMETH 200-200-20 MG/5ML PO SUSP: 30.0000 mL | ORAL | Status: DC | PRN

## 2023-12-14 MED ORDER — OLANZAPINE 10 MG PO TABS
10.0000 mg | ORAL_TABLET | Freq: Every day | ORAL | Status: DC
  Administered 2023-12-15: 10 mg via ORAL
  Filled 2023-12-14: qty 1

## 2023-12-14 MED ORDER — HALOPERIDOL LACTATE 5 MG/ML IJ SOLN: 5.0000 mg | Freq: Three times a day (TID) | INTRAMUSCULAR | Status: DC | PRN

## 2023-12-14 NOTE — Progress Notes (Signed)
   12/14/23 2216  BH[facility] Triage Screening (Walk-ins at [facility] only)  How Did You Hear About Us ? Self  What Is the Reason for Your Visit/Call Today? Pt is a 43 yo male who presents to [facility] voluntarily unaccompanied.Pt reports that he has hx of schizophrenia and he has not had any medications since Tuesday. Pt reports that he was hospitalized at [facility] and was recently discharged. Pt reports that he has ongoing SI thoughts without a plan. Pt reports that he is currently homeless. Pt denies HI and AVH. Pt reports difficulty sleeping. Pt reports that he is not keeping up with his hyigene. Pt states that he feels like he needs to be admitted and connected to the behavioral health hospital. Pt denies being active with outpatient services.  How Long Has This Been Causing You Problems? > than 6 months  Have You Recently Had Any Thoughts About Hurting Yourself? Yes  How long ago did you have thoughts about hurting yourself? today  Are You Planning to Commit Suicide/Harm Yourself At This time? No  Have you Recently Had Thoughts About Hurting Someone [name]? No (UTA)  How long ago did you have thoughts of harming others? n/a  Are You Planning To Harm Someone At This Time? No  Explanation: Denies HI  Physical Abuse Denies  Verbal Abuse Denies  Sexual Abuse Denies  Exploitation of patient/patient's resources Denies  Self-Neglect Denies  Possible abuse reported to:  (n/a)  Are you currently experiencing any auditory, visual or other hallucinations? No  Please explain the hallucinations you are currently experiencing: n/a  Have You Used Any Alcohol or Drugs in the Past 24 Hours? No  What Did You Use and How Much? Pt denies etoh and drug use .  Do you have any current medical co-morbidities that require immediate attention? No  Clinician description of patient physical appearance/behavior: Pt presents with slowed and soft speech. Pt was cooperative.  What Do You Feel Would Help You the Most Today?  Treatment for Depression or other mood problem;Stress Management;Medication(s)  If access to [facility] was not available, would you have sought care in the Emergency Department? No  Determination of Need Urgent (48 hours)  Options For Referral Inpatient Hospitalization  Determination of Need filed? Yes    Flowsheet Row ED from 12/14/2023 in [facility] Most recent reading at 12/14/2023 10:37 PM ED from 12/14/2023 in [facility] Emergency Department at [facility] Most recent reading at 12/14/2023  1:32 PM ED from 12/06/2023 in [facility] Most recent reading at 12/06/2023  1:17 PM  C-SSRS RISK CATEGORY Low Risk No Risk No Risk

## 2023-12-14 NOTE — ED Triage Notes (Signed)
 Comes in complaining of bilateral feet pain.  Hx of schizo.  Patient also complains of chronic left hip pain.

## 2023-12-14 NOTE — ED Provider Notes (Signed)
 [place] EMERGENCY DEPARTMENT AT [facility] Provider Note   CSN: [id number] Arrival date & time: 12/14/23  1237    Patient presents with: Foot Pain  Aaron Rubio is a 43 y.o. male history of SI, polysubstance use here for evaluation bilateral foot pain.  Patient states he has been wearing some new sandals causing blisters on his feet.  Hurts with walking.  No numbness or weakness.  States he has had some redness to his right great toe.  No drainage.  No known falls or injuries.  He has some chronic back pain which is unchanged.  No IV drug use, bowel or bladder incontinence, saddle paresthesia, fevers.  He is requesting food and some new shoes.  States he is unhoused and and does not have access to medications in the outpatient setting and his feet are constantly dirty.   HPI     Prior to Admission medications   Medication Sig Start Date End Date Taking? Authorizing Provider  amLODipine  (NORVASC ) 10 MG tablet Take 1 tablet (10 mg total) by mouth daily. Patient not taking: Reported on 11/25/2023 11/12/23   Zouev, Dmitri, MD  cyclobenzaprine  (FLEXERIL ) 10 MG tablet Take 0.5-1 tablets (5-10 mg total) by mouth at bedtime as needed for muscle spasms. Patient not taking: Reported on 11/15/2023 11/02/23   Rexie Catena, PA-C  gabapentin  (NEURONTIN ) 300 MG capsule Take 1 capsule (300 mg total) by mouth 3 (three) times daily. Patient not taking: Reported on 11/25/2023 11/11/23   Zouev, Dmitri, MD  hydrOXYzine  (ATARAX ) 25 MG tablet Take 1 tablet (25 mg total) by mouth 3 (three) times daily as needed for anxiety. Patient not taking: Reported on 11/25/2023 11/11/23   Zouev, Dmitri, MD  OLANZapine  (ZYPREXA ) 10 MG tablet Take 1 tablet (10 mg) in the morning daily and 2 tablets (20 mg) every night Patient not taking: Reported on 11/25/2023 11/12/23   Zouev, Dmitri, MD  traZODone  (DESYREL ) 50 MG tablet Take 1 tablet (50 mg total) by mouth at bedtime as needed for sleep. Patient not taking:  Reported on 11/25/2023 11/11/23   Zouev, Dmitri, MD    Allergies: Patient has no known allergies.    Review of Systems  Constitutional: Negative.   HENT: Negative.    Respiratory: Negative.    Cardiovascular: Negative.   Gastrointestinal: Negative.   Genitourinary: Negative.   Musculoskeletal:  Positive for back pain (chronic). Negative for arthralgias, gait problem, joint swelling, myalgias, neck pain and neck stiffness.  Skin:  Positive for rash.  Neurological: Negative.   All other systems reviewed and are negative.   Updated Vital Signs BP (!) 129/96   Pulse 92   Temp 97.9 F (36.6 C) (Oral)   Resp 18   Ht 6' 2 (1.88 m)   Wt 98.4 kg   SpO2 100%   BMI 27.86 kg/m   Physical Exam Vitals and nursing note reviewed.  Constitutional:      General: He is not in acute distress.    Appearance: He is well-developed. He is not ill-appearing, toxic-appearing or diaphoretic.  HENT:     Head: Normocephalic and atraumatic.     Nose: Nose normal.     Mouth/Throat:     Mouth: Mucous membranes are moist.   Eyes:     Pupils: Pupils are equal, round, and reactive to light.    Cardiovascular:     Rate and Rhythm: Normal rate and regular rhythm.     Pulses: Normal pulses.  Radial pulses are 2+ on the right side and 2+ on the left side.       Dorsalis pedis pulses are 2+ on the right side and 2+ on the left side.     Heart sounds: Normal heart sounds.  Pulmonary:     Effort: Pulmonary effort is normal. No respiratory distress.     Breath sounds: Normal breath sounds.  Abdominal:     General: There is no distension.     Palpations: Abdomen is soft.     Tenderness: There is no abdominal tenderness. There is no right CVA tenderness, left CVA tenderness or guarding.   Musculoskeletal:        General: Normal range of motion.     Cervical back: Normal range of motion and neck supple.     Comments: No midline C/T/L tenderness.  Negative straight leg raise bilaterally.   Compartments soft.  Wiggles toes without difficulty.   Skin:    General: Skin is warm and dry.     Comments: Some mild erythema right great toe.  No gangrenous changes.  No discharge.  #1 blister left foot less than 1 cm diameter #2 clear blister right foot posterior aspect foot.    Neurological:     General: No focal deficit present.     Mental Status: He is alert and oriented to person, place, and time.    (all labs ordered are listed, but only abnormal results are displayed) Labs Reviewed - No data to display  EKG: None  Radiology: DG Foot Complete Right Result Date: 12/14/2023 EXAM: 3 VIEW(S) XRAY OF THE RIGHT FOOT 12/14/2023 06:48:10 PM COMPARISON: Comparison to study dated 07/14/2021. CLINICAL HISTORY: Pain. FINDINGS: BONES AND JOINTS: No acute fracture. No focal osseous lesion. No joint dislocation. SOFT TISSUES: The soft tissues are unremarkable. IMPRESSION: 1. No acute abnormality. Electronically signed by: Zadie Herter MD 12/14/2023 07:17 PM EDT RP Workstation: [id number]     Procedures   Medications Ordered in the ED  lidocaine  (LIDODERM ) 5 % 2 patch (2 patches Transdermal Patch Applied 12/14/23 2041)  ketorolac (TORADOL) 30 MG/ML injection 30 mg (30 mg Intramuscular Given 12/14/23 1854)  methocarbamol  (ROBAXIN ) tablet 500 mg (500 mg Oral Given 12/14/23 1853)  acetaminophen  (TYLENOL ) tablet 650 mg (650 mg Oral Given 12/14/23 3835)   43 year old history of schizophrenia here for evaluation of bilateral foot pain right greater than left.  He is unhoused and walks a lot.  States he recently got some new slippers.  He has a blister on plantar aspect of his bilateral feet.  He has some left great toe pain.  He denies any recent falls or injuries.  No fever.  He is neurovascularly intact.  His compartments are soft.  Wounds do not appear infected.  Does appear to have some friction injuries most likely from walking.    Imaging personally viewed interpreted X-ray right foot  without significant abnormality  Discussed results with patient.  He was given some other shoes here in the emergency department.  Keeping feet clean, dry.  Of note he also admits to some chronic back pain which is unchanged.  No red flags for back pain.  No recent falls or injuries.  Negative straight leg raise.  Given muscle relaxer and lidocaine  patches.  Low suspicion for cauda equina, discitis, osteomyelitis, transverse myelitis, psoas abscess, AAA, dissection.  Regarding his lower extremity foot pain I have low suspicion for occult fracture, dislocation, otitis infection, septic joint, gout, gangrene, VTE, ischemia, traumatic injury.  Will  have him follow-up outpatient, return for new worsening symptoms.  Ambulatory here  The patient has been appropriately medically screened and/or stabilized in the ED. I have low suspicion for any other emergent medical condition which would require further screening, evaluation or treatment in the ED or require inpatient management.  Patient is hemodynamically stable and in no acute distress.  Patient able to ambulate in department prior to ED.  Evaluation does not show acute pathology that would require ongoing or additional emergent interventions while in the emergency department or further inpatient treatment.  I have discussed the diagnosis with the patient and answered all questions.  Pain is been managed while in the emergency department and patient has no further complaints prior to discharge.  Patient is comfortable with plan discussed in room and is stable for discharge at this time.  I have discussed strict return precautions for returning to the emergency department.  Patient was encouraged to follow-up with PCP/specialist refer to at discharge.                                    Medical Decision Making Amount and/or Complexity of Data Reviewed External Data Reviewed: labs, radiology and notes. Radiology: ordered and independent interpretation  performed. Decision-making details documented in ED Course.  Risk OTC drugs. Prescription drug management. Parenteral controlled substances. Decision regarding hospitalization. Diagnosis or treatment significantly limited by social determinants of health.       Final diagnoses:  Foot pain, bilateral    ED Discharge Orders     None          Johnay Mano A, PA-C 12/14/23 2054    Tonya Fredrickson, MD 12/15/23 1046

## 2023-12-14 NOTE — Discharge Instructions (Addendum)
 It was a pleasure taking care of you here today.  Make sure to follow-up outpatient, return for any worsening symptoms.

## 2023-12-14 NOTE — ED Provider Notes (Signed)
 [facility] Continuous Assessment Admission H&P  Date: 12/15/23 Patient Name: Aaron Rubio MRN: [id number] Chief Complaint: suicidal ideations  Diagnoses:  Final diagnoses:  Paranoid schizophrenia (HCC)  Homelessness    HPI: Aaron Rubio is a 43 y/o male presented voluntarily to [facility] as a walk in unaccompanied by with complaints of suicidal ideations without a specific plan or intent.  Aaron Rubio, 43 y.o., male patient seen face to face by this provider and chart reviewed on 12/14/23. During evaluation Aaron Rubio is sitting in the assessment room in no acute distress. He is alert, oriented x 2, calm, cooperative, but appears distracted. His mood is labile with congruent affect. Patient speaks in a very low voice and appears distracted as if he is having some thought blocking. Patient is very tangential and somewhat bizarre and disorganized in his thinking.  Patient endorses suicidal ideations without specific plan or intent.  Patient denies any homicidal ideations.  Patient reports that he has not had any medications since Tuesday. Patient reports that he has been using cocaine. Patient reports that he hears a voice that is bragging. Patient would not elaborate what the voice was bragging about. 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  Patient states that he is homeless and has been staying outside and has not slept in two days. Per chart review patient was recently discharged from [facility] on December 07, 2023 and was transported to [facility] for inpatient treatment.  Patient will be admitted to [facility] continuous observation for crisis management, safety and stabilization. Will restart patient home medications zyprexa  10 mg am and zyprexa  20 mg at bedtime and trazodone  50 mg and norvasc  10 mg.   Total Time spent with patient: 20 minutes  Musculoskeletal  Strength & Muscle Tone: within normal limits Gait & Station: normal Patient leans:  N/A  Psychiatric Specialty Exam  Presentation General Appearance:  Disheveled  Eye Contact: Fair  Speech: Slow; Garbled  Speech Volume: Decreased  Handedness: Right   Mood and Affect  Mood: Euthymic  Affect: Flat   Thought Process  Thought Processes: Disorganized  Descriptions of Associations:Tangential  Orientation:Partial  Thought Content:Scattered  Diagnosis of Schizophrenia or Schizoaffective disorder in past: Yes  Duration of Psychotic Symptoms: Greater than six months  Hallucinations:Hallucinations: Auditory Description of Auditory Hallucinations: hears peple bragging Description of Visual Hallucinations: denies  Ideas of Reference:None  Suicidal Thoughts:Suicidal Thoughts: Yes, Passive SI Passive Intent and/or Plan: Without Intent; Without Plan  Homicidal Thoughts:Homicidal Thoughts: No   Sensorium  Memory: Immediate Poor; Recent Poor; Remote Poor  Judgment: Poor  Insight: Poor   Executive Functions  Concentration: Poor  Attention Span: Poor  Recall: Poor  Fund of Knowledge: Fair  Language: Fair   Psychomotor Activity  Psychomotor Activity: Psychomotor Activity: Normal   Assets  Assets: Desire for Improvement; Physical Health; Communication Skills   Sleep  Sleep: Sleep: Poor Number of Hours of Sleep: 3   Nutritional Assessment (For OBS and FBC admissions only) Has the patient had a weight loss or gain of 10 pounds or more in the last 3 months?: No Has the patient had a decrease in food intake/or appetite?: No Does the patient have dental problems?: No Does the patient have eating habits or behaviors that may be indicators of an eating disorder including binging or inducing vomiting?: No Has the patient recently lost weight without trying?: 0 Has the patient been eating poorly because of a decreased appetite?: 0 Malnutrition Screening Tool  Score: 0    Physical Exam HENT:     Head: Normocephalic.      Nose: Nose normal.   Eyes:     Pupils: Pupils are equal, round, and reactive to light.    Cardiovascular:     Rate and Rhythm: Normal rate.  Pulmonary:     Effort: Pulmonary effort is normal.   Musculoskeletal:        General: Normal range of motion.   Skin:    General: Skin is warm.   Neurological:     Mental Status: He is alert. Mental status is at baseline.   Psychiatric:        Attention and Perception: He perceives auditory hallucinations.        Mood and Affect: Affect is blunt and flat.        Speech: Speech is delayed.        Behavior: Behavior is slowed and actively hallucinating.        Thought Content: Thought content is paranoid.        Judgment: Judgment is impulsive.    Review of Systems  Constitutional: Negative.   HENT: Negative.    Eyes: Negative.   Respiratory: Negative.    Cardiovascular: Negative.   Gastrointestinal: Negative.   Genitourinary: Negative.   Musculoskeletal: Negative.   Skin: Negative.   Neurological: Negative.   Endo/Heme/Allergies: Negative.   Psychiatric/Behavioral:  Positive for hallucinations.     Blood pressure 132/89, pulse 100, temperature 98.2 F (36.8 C), temperature source Oral, resp. rate 20, SpO2 99%. There is no height or weight on file to calculate BMI.  Past Psychiatric History: Paranoid schizophrenia, polysubstance abuse  Is the patient at risk to self? No  Has the patient been a risk to self in the past 6 months? No .    Has the patient been a risk to self within the distant past? No   Is the patient a risk to others? No   Has the patient been a risk to others in the past 6 months? No   Has the patient been a risk to others within the distant past? No   Past Medical History:  Past Medical History:  Diagnosis Date   Hypertension    Schizophrenia (HCC)      Family History: Unknown  Social History: 43 year old male, homeless, with a history of polysubstance abuse  Last Labs:  Admission on 12/14/2023   Component Date Value Ref Range Status   WBC 12/14/2023 10.7 (H)  4.0 - 10.5 K/uL Final   RBC 12/14/2023 5.26  4.22 - 5.81 MIL/uL Final   Hemoglobin 12/14/2023 13.9  13.0 - 17.0 g/dL Final   HCT 16/04/9603 43.5  39.0 - 52.0 % Final   MCV 12/14/2023 82.7  80.0 - 100.0 fL Final   MCH 12/14/2023 26.4  26.0 - 34.0 pg Final   MCHC 12/14/2023 32.0  30.0 - 36.0 g/dL Final   RDW 54/03/8118 13.4  11.5 - 15.5 % Final   Platelets 12/14/2023 267  150 - 400 K/uL Final   nRBC 12/14/2023 0.0  0.0 - 0.2 % Final   Neutrophils Relative % 12/14/2023 48  % Final   Neutro Abs 12/14/2023 5.3  1.7 - 7.7 K/uL Final   Lymphocytes Relative 12/14/2023 25  % Final   Lymphs Abs 12/14/2023 2.7  0.7 - 4.0 K/uL Final   Monocytes Relative 12/14/2023 13  % Final   Monocytes Absolute 12/14/2023 1.3 (H)  0.1 - 1.0 K/uL Final   Eosinophils  Relative 12/14/2023 12  % Final   Eosinophils Absolute 12/14/2023 1.2 (H)  0.0 - 0.5 K/uL Final   Basophils Relative 12/14/2023 1  % Final   Basophils Absolute 12/14/2023 0.1  0.0 - 0.1 K/uL Final   Immature Granulocytes 12/14/2023 1  % Final   Abs Immature Granulocytes 12/14/2023 0.06  0.00 - 0.07 K/uL Final   Performed at [facility], [address], [place], [place] [address]   Sodium 12/14/2023 135  135 - 145 mmol/L Final   Potassium 12/14/2023 3.8  3.5 - 5.1 mmol/L Final   Chloride 12/14/2023 100  98 - 111 mmol/L Final   CO2 12/14/2023 26  22 - 32 mmol/L Final   Glucose, Bld 12/14/2023 140 (H)  70 - 99 mg/dL Final   Glucose reference range applies only to samples taken after fasting for at least 8 hours.   BUN 12/14/2023 30 (H)  6 - 20 mg/dL Final   Creatinine, Ser 12/14/2023 1.36 (H)  0.61 - 1.24 mg/dL Final   Calcium 78/29/5621 9.6  8.9 - 10.3 mg/dL Final   Total Protein 30/86/5784 6.5  6.5 - 8.1 g/dL Final   Albumin 69/62/9528 3.9  3.5 - 5.0 g/dL Final   AST 41/32/4401 49 (H)  15 - 41 U/L Final   ALT 12/14/2023 40  0 - 44 U/L Final   Alkaline Phosphatase 12/14/2023  55  38 - 126 U/L Final   Total Bilirubin 12/14/2023 1.0  0.0 - 1.2 mg/dL Final   GFR, Estimated 12/14/2023 >60  >60 mL/min Final   Comment: (NOTE) Calculated using the CKD-EPI Creatinine Equation (2021)    Anion gap 12/14/2023 9  5 - 15 Final   Performed at [facility], [address], [place], [place] [address]   Alcohol, Ethyl (B) 12/14/2023 <15  <15 mg/dL Final   Comment: (NOTE) For medical purposes only. Performed at [facility], [address] [place], [place] [address]   Admission on 12/06/2023, Discharged on 12/07/2023  Component Date Value Ref Range Status   WBC 12/06/2023 13.0 (H)  4.0 - 10.5 K/uL Final   RBC 12/06/2023 5.20  4.22 - 5.81 MIL/uL Final   Hemoglobin 12/06/2023 13.9  13.0 - 17.0 g/dL Final   HCT 03/47/4259 44.4  39.0 - 52.0 % Final   MCV 12/06/2023 85.4  80.0 - 100.0 fL Final   MCH 12/06/2023 26.7  26.0 - 34.0 pg Final   MCHC 12/06/2023 31.3  30.0 - 36.0 g/dL Final   RDW 56/38/7564 13.6  11.5 - 15.5 % Final   Platelets 12/06/2023 272  150 - 400 K/uL Final   nRBC 12/06/2023 0.0  0.0 - 0.2 % Final   Neutrophils Relative % 12/06/2023 64  % Final   Neutro Abs 12/06/2023 8.4 (H)  1.7 - 7.7 K/uL Final   Lymphocytes Relative 12/06/2023 18  % Final   Lymphs Abs 12/06/2023 2.4  0.7 - 4.0 K/uL Final   Monocytes Relative 12/06/2023 11  % Final   Monocytes Absolute 12/06/2023 1.4 (H)  0.1 - 1.0 K/uL Final   Eosinophils Relative 12/06/2023 5  % Final   Eosinophils Absolute 12/06/2023 0.7 (H)  0.0 - 0.5 K/uL Final   Basophils Relative 12/06/2023 1  % Final   Basophils Absolute 12/06/2023 0.1  0.0 - 0.1 K/uL Final   Immature Granulocytes 12/06/2023 1  % Final   Abs Immature Granulocytes 12/06/2023 0.07  0.00 - 0.07 K/uL Final   Performed at [facility], [address], [place], [place] [address]   Sodium 12/06/2023 139  135 - 145 mmol/L Final   Potassium 12/06/2023 4.2  3.5 - 5.1 mmol/L Final   Chloride 12/06/2023 101  98 - 111 mmol/L Final   CO2  12/06/2023 27  22 - 32 mmol/L Final   Glucose, Bld 12/06/2023 86  70 - 99 mg/dL Final   Glucose reference range applies only to samples taken after fasting for at least 8 hours.   BUN 12/06/2023 27 (H)  6 - 20 mg/dL Final   Creatinine, Ser 12/06/2023 1.54 (H)  0.61 - 1.24 mg/dL Final   Calcium 19/14/7829 9.8  8.9 - 10.3 mg/dL Final   Total Protein 56/21/3086 7.1  6.5 - 8.1 g/dL Final   Albumin 57/84/6962 4.2  3.5 - 5.0 g/dL Final   AST 95/28/4132 64 (H)  15 - 41 U/L Final   ALT 12/06/2023 118 (H)  0 - 44 U/L Final   Alkaline Phosphatase 12/06/2023 60  38 - 126 U/L Final   Total Bilirubin 12/06/2023 0.7  0.0 - 1.2 mg/dL Final   GFR, Estimated 12/06/2023 57 (L)  >60 mL/min Final   Comment: (NOTE) Calculated using the CKD-EPI Creatinine Equation (2021)    Anion gap 12/06/2023 11  5 - 15 Final   Performed at [facility], [address], [place], [place] [address]   Alcohol, Ethyl (B) 12/06/2023 <15  <15 mg/dL Final   Comment: (NOTE) For medical purposes only. Performed at [facility], [address], [place], [place] [address]    TSH 12/06/2023 0.647  0.350 - 4.500 uIU/mL Final   Comment: Performed by a 3rd Generation assay with a functional sensitivity of <=0.01 uIU/mL. Performed at [facility], [address], [place], [place] [address]    POC Amphetamine UR 12/07/2023 None Detected   Final   POC Secobarbital (BAR) 12/07/2023 None Detected   Final   POC Buprenorphine (BUP) 12/07/2023 None Detected   Final   POC Oxazepam (BZO) 12/07/2023 None Detected   Final   POC Cocaine UR 12/07/2023 Positive (A)   Final   POC Methamphetamine UR 12/07/2023 None Detected   Final   POC Morphine 12/07/2023 None Detected   Final   POC Methadone UR 12/07/2023 None Detected   Final   POC Oxycodone UR 12/07/2023 None Detected   Final   POC Marijuana UR 12/07/2023 Positive (A)   Final   Glucose-Capillary 12/06/2023 117 (H)  70 - 99 mg/dL Final   Glucose reference range applies only  to samples taken after fasting for at least 8 hours.  Admission on 11/25/2023, Discharged on 11/27/2023  Component Date Value Ref Range Status   Sodium 11/25/2023 138  135 - 145 mmol/L Final   Potassium 11/25/2023 3.8  3.5 - 5.1 mmol/L Final   Chloride 11/25/2023 105  98 - 111 mmol/L Final   CO2 11/25/2023 24  22 - 32 mmol/L Final   Glucose, Bld 11/25/2023 107 (H)  70 - 99 mg/dL Final   Glucose reference range applies only to samples taken after fasting for at least 8 hours.   BUN 11/25/2023 26 (H)  6 - 20 mg/dL Final   Creatinine, Ser 11/25/2023 1.38 (H)  0.61 - 1.24 mg/dL Final   Calcium 34/74/2595 9.1  8.9 - 10.3 mg/dL Final   Total Protein 63/87/5643 7.1  6.5 - 8.1 g/dL Final   Albumin 32/95/1884 3.9  3.5 - 5.0 g/dL Final  AST 11/25/2023 40  15 - 41 U/L Final   ALT 11/25/2023 26  0 - 44 U/L Final   Alkaline Phosphatase 11/25/2023 69  38 - 126 U/L Final   Total Bilirubin 11/25/2023 0.4  0.0 - 1.2 mg/dL Final   GFR, Estimated 11/25/2023 >60  >60 mL/min Final   Comment: (NOTE) Calculated using the CKD-EPI Creatinine Equation (2021)    Anion gap 11/25/2023 9  5 - 15 Final   Performed at [facility], [address] [place], [place] [address]   Alcohol, Ethyl (B) 11/25/2023 <15  <15 mg/dL Final   Comment: (NOTE) For medical purposes only. Performed at [facility], [address] [place], [place] [address]    WBC 11/25/2023 10.5  4.0 - 10.5 K/uL Final   RBC 11/25/2023 5.00  4.22 - 5.81 MIL/uL Final   Hemoglobin 11/25/2023 13.3  13.0 - 17.0 g/dL Final   HCT 09/81/1914 42.4  39.0 - 52.0 % Final   MCV 11/25/2023 84.8  80.0 - 100.0 fL Final   MCH 11/25/2023 26.6  26.0 - 34.0 pg Final   MCHC 11/25/2023 31.4  30.0 - 36.0 g/dL Final   RDW 78/29/5621 13.4  11.5 - 15.5 % Final   Platelets 11/25/2023 275  150 - 400 K/uL Final   nRBC 11/25/2023 0.0  0.0 - 0.2 % Final   Performed at [facility], [address], [place],  [place] [address]   Opiates 11/25/2023 NONE DETECTED  NONE DETECTED Final   Cocaine 11/25/2023 POSITIVE (A)  NONE DETECTED Final   Benzodiazepines 11/25/2023 NONE DETECTED  NONE DETECTED Final   Amphetamines 11/25/2023 NONE DETECTED  NONE DETECTED Final   Tetrahydrocannabinol 11/25/2023 NONE DETECTED  NONE DETECTED Final   Barbiturates 11/25/2023 NONE DETECTED  NONE DETECTED Final   Comment: (NOTE) DRUG SCREEN FOR MEDICAL PURPOSES ONLY.  IF CONFIRMATION IS NEEDED FOR ANY PURPOSE, NOTIFY LAB WITHIN 5 DAYS.  LOWEST DETECTABLE LIMITS FOR URINE DRUG SCREEN Drug Class                     Cutoff (ng/mL) Amphetamine and metabolites    1000 Barbiturate and metabolites    200 Benzodiazepine                 200 Opiates and metabolites        300 Cocaine and metabolites        300 THC                            50 Performed at [facility], [address], [place], [place] [address]   Admission on 11/02/2023, Discharged on 11/02/2023  Component Date Value Ref Range Status   Sodium 11/02/2023 137  135 - 145 mmol/L Final   Potassium 11/02/2023 3.6  3.5 - 5.1 mmol/L Final   Chloride 11/02/2023 101  98 - 111 mmol/L Final   CO2 11/02/2023 25  22 - 32 mmol/L Final   Glucose, Bld 11/02/2023 75  70 - 99 mg/dL Final   Glucose reference range applies only to samples taken after fasting for at least 8 hours.   BUN 11/02/2023 28 (H)  6 - 20 mg/dL Final   Creatinine, Ser 11/02/2023 1.21  0.61 - 1.24 mg/dL Final   Calcium 62/95/2841 9.4  8.9 - 10.3 mg/dL Final   Total Protein 32/44/0102 7.3  6.5 - 8.1 g/dL Final   Albumin  11/02/2023 4.2  3.5 - 5.0 g/dL Final   AST 16/04/9603 51 (H)  15 - 41 U/L Final   ALT 11/02/2023 31  0 - 44 U/L Final   Alkaline Phosphatase 11/02/2023 52  38 - 126 U/L Final   Total Bilirubin 11/02/2023 1.2  0.0 - 1.2 mg/dL Final   GFR, Estimated 11/02/2023 >60  >60 mL/min Final   Comment: (NOTE) Calculated using the CKD-EPI Creatinine Equation (2021)    Anion gap  11/02/2023 11  5 - 15 Final   Performed at [facility], [address] [place], [place] [address]   Alcohol, Ethyl (B) 11/02/2023 <15  <15 mg/dL Final   Comment: Please note change in reference range. (NOTE) For medical purposes only. Performed at [facility], [address] [place], [place] [address]    Opiates 11/02/2023 NONE DETECTED  NONE DETECTED Final   Cocaine 11/02/2023 POSITIVE (A)  NONE DETECTED Final   Benzodiazepines 11/02/2023 NONE DETECTED  NONE DETECTED Final   Amphetamines 11/02/2023 NONE DETECTED  NONE DETECTED Final   Tetrahydrocannabinol 11/02/2023 POSITIVE (A)  NONE DETECTED Final   Barbiturates 11/02/2023 NONE DETECTED  NONE DETECTED Final   Comment: (NOTE) DRUG SCREEN FOR MEDICAL PURPOSES ONLY.  IF CONFIRMATION IS NEEDED FOR ANY PURPOSE, NOTIFY LAB WITHIN 5 DAYS.  LOWEST DETECTABLE LIMITS FOR URINE DRUG SCREEN Drug Class                     Cutoff (ng/mL) Amphetamine and metabolites    1000 Barbiturate and metabolites    200 Benzodiazepine                 200 Opiates and metabolites        300 Cocaine and metabolites        300 THC                            50 Performed at [facility], [address], [place], [place] [address]    WBC 11/02/2023 10.6 (H)  4.0 - 10.5 K/uL Final   RBC 11/02/2023 5.08  4.22 - 5.81 MIL/uL Final   Hemoglobin 11/02/2023 13.5  13.0 - 17.0 g/dL Final   HCT 62/13/0865 42.6  39.0 - 52.0 % Final   MCV 11/02/2023 83.9  80.0 - 100.0 fL Final   MCH 11/02/2023 26.6  26.0 - 34.0 pg Final   MCHC 11/02/2023 31.7  30.0 - 36.0 g/dL Final   RDW 78/46/9629 13.5  11.5 - 15.5 % Final   Platelets 11/02/2023 203  150 - 400 K/uL Final   nRBC 11/02/2023 0.0  0.0 - 0.2 % Final   Neutrophils Relative % 11/02/2023 59  % Final   Neutro Abs 11/02/2023 6.3  1.7 - 7.7 K/uL Final   Lymphocytes Relative 11/02/2023 20  % Final   Lymphs Abs 11/02/2023 2.1  0.7 - 4.0 K/uL Final   Monocytes  Relative 11/02/2023 13  % Final   Monocytes Absolute 11/02/2023 1.4 (H)  0.1 - 1.0 K/uL Final   Eosinophils Relative 11/02/2023 6  % Final   Eosinophils Absolute 11/02/2023 0.7 (H)  0.0 - 0.5 K/uL Final   Basophils Relative 11/02/2023 1  % Final   Basophils Absolute 11/02/2023 0.1  0.0 - 0.1 K/uL Final   Immature Granulocytes 11/02/2023 1  % Final   Abs Immature Granulocytes 11/02/2023 0.07  0.00 - 0.07 K/uL Final   Performed  at [facility], [address], [place], [place] [address]  Admission on 10/25/2023, Discharged on 10/31/2023  Component Date Value Ref Range Status   Sodium 10/27/2023 140  135 - 145 mmol/L Final   Potassium 10/27/2023 4.0  3.5 - 5.1 mmol/L Final   Chloride 10/27/2023 108  98 - 111 mmol/L Final   CO2 10/27/2023 23  22 - 32 mmol/L Final   Glucose, Bld 10/27/2023 95  70 - 99 mg/dL Final   Glucose reference range applies only to samples taken after fasting for at least 8 hours.   BUN 10/27/2023 19  6 - 20 mg/dL Final   Creatinine, Ser 10/27/2023 1.14  0.61 - 1.24 mg/dL Final   Calcium 56/21/3086 8.8 (L)  8.9 - 10.3 mg/dL Final   Total Protein 57/84/6962 6.0 (L)  6.5 - 8.1 g/dL Final   Albumin 95/28/4132 3.4 (L)  3.5 - 5.0 g/dL Final   AST 44/07/270 25  15 - 41 U/L Final   ALT 10/27/2023 27  0 - 44 U/L Final   Alkaline Phosphatase 10/27/2023 61  38 - 126 U/L Final   Total Bilirubin 10/27/2023 0.3  0.0 - 1.2 mg/dL Final   GFR, Estimated 10/27/2023 >60  >60 mL/min Final   Comment: (NOTE) Calculated using the CKD-EPI Creatinine Equation (2021)    Anion gap 10/27/2023 9  5 - 15 Final   Performed at [facility], [address] [place], [place] [address]   TSH 10/27/2023 0.962  0.350 - 4.500 uIU/mL Final   Comment: Performed by a 3rd Generation assay with a functional sensitivity of <=0.01 uIU/mL. Performed at [facility], [address], [place], [place] [address]    Hgb A1c MFr Bld 10/27/2023 5.0  4.8 -  5.6 % Final   Comment: (NOTE) Pre diabetes:          5.7%-6.4%  Diabetes:              >6.4%  Glycemic control for   <7.0% adults with diabetes    Mean Plasma Glucose 10/27/2023 96.8  mg/dL Final   Performed at [facility], [address], [place], [place] [address]  Admission on 10/24/2023, Discharged on 10/25/2023  Component Date Value Ref Range Status   POC Amphetamine UR 10/24/2023 None Detected  NONE DETECTED (Cut Off Level 1000 ng/mL) Final   POC Secobarbital (BAR) 10/24/2023 None Detected  NONE DETECTED (Cut Off Level 300 ng/mL) Final   POC Buprenorphine (BUP) 10/24/2023 None Detected  NONE DETECTED (Cut Off Level 10 ng/mL) Final   POC Oxazepam (BZO) 10/24/2023 None Detected  NONE DETECTED (Cut Off Level 300 ng/mL) Final   POC Cocaine UR 10/24/2023 Positive (A)  NONE DETECTED (Cut Off Level 300 ng/mL) Final   POC Methamphetamine UR 10/24/2023 None Detected  NONE DETECTED (Cut Off Level 1000 ng/mL) Final   POC Morphine 10/24/2023 None Detected  NONE DETECTED (Cut Off Level 300 ng/mL) Final   POC Methadone UR 10/24/2023 None Detected  NONE DETECTED (Cut Off Level 300 ng/mL) Final   POC Oxycodone UR 10/24/2023 None Detected  NONE DETECTED (Cut Off Level 100 ng/mL) Final   POC Marijuana UR 10/24/2023 None Detected  NONE DETECTED (Cut Off Level 50 ng/mL) Final  Admission on 10/19/2023, Discharged on 10/22/2023  Component Date Value Ref Range Status   WBC 10/19/2023 10.8 (H)  4.0 - 10.5 K/uL Final   RBC 10/19/2023 5.59  4.22 - 5.81 MIL/uL Final   Hemoglobin  10/19/2023 14.6  13.0 - 17.0 g/dL Final   HCT 32/44/0102 46.1  39.0 - 52.0 % Final   MCV 10/19/2023 82.5  80.0 - 100.0 fL Final   MCH 10/19/2023 26.1  26.0 - 34.0 pg Final   MCHC 10/19/2023 31.7  30.0 - 36.0 g/dL Final   RDW 72/53/6644 13.2  11.5 - 15.5 % Final   Platelets 10/19/2023 275  150 - 400 K/uL Final   nRBC 10/19/2023 0.0  0.0 - 0.2 % Final   Neutrophils Relative % 10/19/2023 62  % Final   Neutro Abs 10/19/2023  6.8  1.7 - 7.7 K/uL Final   Lymphocytes Relative 10/19/2023 21  % Final   Lymphs Abs 10/19/2023 2.3  0.7 - 4.0 K/uL Final   Monocytes Relative 10/19/2023 10  % Final   Monocytes Absolute 10/19/2023 1.0  0.1 - 1.0 K/uL Final   Eosinophils Relative 10/19/2023 5  % Final   Eosinophils Absolute 10/19/2023 0.6 (H)  0.0 - 0.5 K/uL Final   Basophils Relative 10/19/2023 1  % Final   Basophils Absolute 10/19/2023 0.1  0.0 - 0.1 K/uL Final   Immature Granulocytes 10/19/2023 1  % Final   Abs Immature Granulocytes 10/19/2023 0.10 (H)  0.00 - 0.07 K/uL Final   Performed at [facility], [address] [place], [place] [address]   Sodium 10/19/2023 140  135 - 145 mmol/L Final   Potassium 10/19/2023 4.8  3.5 - 5.1 mmol/L Final   Chloride 10/19/2023 100  98 - 111 mmol/L Final   CO2 10/19/2023 28  22 - 32 mmol/L Final   Glucose, Bld 10/19/2023 81  70 - 99 mg/dL Final   Glucose reference range applies only to samples taken after fasting for at least 8 hours.   BUN 10/19/2023 15  6 - 20 mg/dL Final   Creatinine, Ser 10/19/2023 1.19  0.61 - 1.24 mg/dL Final   Calcium 25/95/6387 10.6 (H)  8.9 - 10.3 mg/dL Final   Total Protein 56/43/3295 7.0  6.5 - 8.1 g/dL Final   Albumin 18/84/1660 4.3  3.5 - 5.0 g/dL Final   AST 63/07/6008 117 (H)  15 - 41 U/L Final   ALT 10/19/2023 74 (H)  0 - 44 U/L Final   Alkaline Phosphatase 10/19/2023 55  38 - 126 U/L Final   Total Bilirubin 10/19/2023 0.9  0.0 - 1.2 mg/dL Final   GFR, Estimated 10/19/2023 >60  >60 mL/min Final   Comment: (NOTE) Calculated using the CKD-EPI Creatinine Equation (2021)    Anion gap 10/19/2023 12  5 - 15 Final   Performed at [facility], [address], [place], [place] [address]   Cholesterol 10/19/2023 152  0 - 200 mg/dL Final   Triglycerides 57/32/2025 144  <150 mg/dL Final   HDL 42/70/6237 57  >40 mg/dL Final   Total CHOL/HDL Ratio 10/19/2023 2.7  RATIO Final   VLDL 10/19/2023 29  0 - 40 mg/dL Final   LDL Cholesterol  10/19/2023 66  0 - 99 mg/dL Final   Comment:        Total Cholesterol/HDL:CHD Risk Coronary Heart Disease Risk Table                     Men   Women  1/2 Average Risk   3.4   3.3  Average Risk       5.0   4.4  2 X Average Risk   9.6   7.1  3 X  Average Risk  23.4   11.0        Use the calculated Patient Ratio above and the CHD Risk Table to determine the patient's CHD Risk.        ATP III CLASSIFICATION (LDL):  <100     mg/dL   Optimal  161-096  mg/dL   Near or Above                    Optimal  130-159  mg/dL   Borderline  045-409  mg/dL   High  >811     mg/dL   Very High [facility] at [facility] [facility], [address] [place], [place] [address]    Magnesium  10/19/2023 2.2  1.7 - 2.4 mg/dL Final   Performed at [facility], [address] [place], [place] [address]   POC Amphetamine UR 10/20/2023 None Detected  NONE DETECTED (Cut Off Level 1000 ng/mL) Final   POC Secobarbital (BAR) 10/20/2023 None Detected  NONE DETECTED (Cut Off Level 300 ng/mL) Final   POC Buprenorphine (BUP) 10/20/2023 None Detected  NONE DETECTED (Cut Off Level 10 ng/mL) Final   POC Oxazepam (BZO) 10/20/2023 None Detected  NONE DETECTED (Cut Off Level 300 ng/mL) Final   POC Cocaine UR 10/20/2023 None Detected  NONE DETECTED (Cut Off Level 300 ng/mL) Final   POC Methamphetamine UR 10/20/2023 None Detected  NONE DETECTED (Cut Off Level 1000 ng/mL) Final   POC Morphine 10/20/2023 None Detected  NONE DETECTED (Cut Off Level 300 ng/mL) Final   POC Methadone UR 10/20/2023 None Detected  NONE DETECTED (Cut Off Level 300 ng/mL) Final   POC Oxycodone UR 10/20/2023 None Detected  NONE DETECTED (Cut Off Level 100 ng/mL) Final   POC Marijuana UR 10/20/2023 None Detected  NONE DETECTED (Cut Off Level 50 ng/mL) Final   SARS Coronavirus 2 by RT PCR 10/20/2023 NEGATIVE  NEGATIVE Final   Performed at [facility], [address], [place], [place] [address]    Allergies: Patient has no known  allergies.  Medications:  Facility Ordered Medications  Medication   [COMPLETED] ketorolac (TORADOL) 30 MG/ML injection 30 mg   [COMPLETED] methocarbamol  (ROBAXIN ) tablet 500 mg   [COMPLETED] acetaminophen  (TYLENOL ) tablet 650 mg   acetaminophen  (TYLENOL ) tablet 650 mg   alum & mag hydroxide-simeth (MAALOX/MYLANTA) 200-200-20 MG/5ML suspension 30 mL   magnesium  hydroxide (MILK OF MAGNESIA) suspension 30 mL   haloperidol  (HALDOL ) tablet 5 mg   And   diphenhydrAMINE  (BENADRYL ) capsule 50 mg   haloperidol  lactate (HALDOL ) injection 5 mg   And   diphenhydrAMINE  (BENADRYL ) injection 50 mg   And   LORazepam  (ATIVAN ) injection 2 mg   haloperidol  lactate (HALDOL ) injection 10 mg   And   diphenhydrAMINE  (BENADRYL ) injection 50 mg   And   LORazepam  (ATIVAN ) injection 2 mg   hydrOXYzine  (ATARAX ) tablet 25 mg   traZODone  (DESYREL ) tablet 50 mg   OLANZapine  (ZYPREXA ) tablet 10 mg   OLANZapine  (ZYPREXA ) tablet 20 mg   amLODipine  (NORVASC ) tablet 10 mg   PTA Medications  Medication Sig   cyclobenzaprine  (FLEXERIL ) 10 MG tablet Take 0.5-1 tablets (5-10 mg total) by mouth at bedtime as needed for muscle spasms. (Patient not taking: Reported on 11/15/2023)   amLODipine  (NORVASC ) 10 MG tablet Take 1 tablet (10 mg total) by mouth daily. (Patient not taking: Reported on 11/25/2023)   hydrOXYzine  (ATARAX ) 25 MG tablet Take 1 tablet (25 mg total) by mouth 3 (three) times daily  as needed for anxiety. (Patient not taking: Reported on 11/25/2023)   OLANZapine  (ZYPREXA ) 10 MG tablet Take 1 tablet (10 mg) in the morning daily and 2 tablets (20 mg) every night (Patient not taking: Reported on 11/25/2023)   traZODone  (DESYREL ) 50 MG tablet Take 1 tablet (50 mg total) by mouth at bedtime as needed for sleep. (Patient not taking: Reported on 11/25/2023)   gabapentin  (NEURONTIN ) 300 MG capsule Take 1 capsule (300 mg total) by mouth 3 (three) times daily. (Patient not taking: Reported on 11/25/2023)      Medical  Decision Making  Tavious M. Linarez is a 43 y/o male presented to [facility] as a walk in unaccompanied by with complaints of suicidal ideations without a specific plan or intent.    Recommendations  Based on my evaluation the patient does not appear to have an emergency medical condition.  Patient will be admitted to [facility] continuous observation for crisis management, safety and stabilization.  Keyle Doby E Taneil Lazarus, NP 12/15/23  7:45 AM

## 2023-12-15 ENCOUNTER — Encounter (HOSPITAL_COMMUNITY): Payer: Self-pay | Admitting: Psychiatry

## 2023-12-15 ENCOUNTER — Inpatient Hospital Stay (HOSPITAL_COMMUNITY)
Admission: AD | Admit: 2023-12-15 | Discharge: 2023-12-20 | DRG: 885 | Disposition: A | Discharge status: Home / Self Care | Source: Intra-hospital | Attending: Psychiatry | Admitting: Psychiatry

## 2023-12-15 DIAGNOSIS — R45851 Suicidal ideations: Secondary | ICD-10-CM

## 2023-12-15 DIAGNOSIS — F17210 Nicotine dependence, cigarettes, uncomplicated: Secondary | ICD-10-CM | POA: Diagnosis present

## 2023-12-15 DIAGNOSIS — Z5900 Homelessness unspecified: Secondary | ICD-10-CM

## 2023-12-15 DIAGNOSIS — Z560 Unemployment, unspecified: Secondary | ICD-10-CM

## 2023-12-15 DIAGNOSIS — F200 Paranoid schizophrenia: Secondary | ICD-10-CM

## 2023-12-15 DIAGNOSIS — M79671 Pain in right foot: Secondary | ICD-10-CM | POA: Diagnosis not present

## 2023-12-15 DIAGNOSIS — Z79899 Other long term (current) drug therapy: Secondary | ICD-10-CM

## 2023-12-15 DIAGNOSIS — Z5982 Transportation insecurity: Secondary | ICD-10-CM

## 2023-12-15 DIAGNOSIS — I10 Essential (primary) hypertension: Secondary | ICD-10-CM | POA: Diagnosis present

## 2023-12-15 DIAGNOSIS — Z5948 Other specified lack of adequate food: Secondary | ICD-10-CM | POA: Diagnosis not present

## 2023-12-15 DIAGNOSIS — Z5941 Food insecurity: Secondary | ICD-10-CM

## 2023-12-15 LAB — CBC WITH DIFFERENTIAL/PLATELET
Abs Immature Granulocytes: 0.06 10*3/uL (ref 0.00–0.07)
Basophils Absolute: 0.1 10*3/uL (ref 0.0–0.1)
Basophils Relative: 1 %
Eosinophils Absolute: 1.2 10*3/uL — ABNORMAL HIGH (ref 0.0–0.5)
Eosinophils Relative: 12 %
HCT: 43.5 % (ref 39.0–52.0)
Hemoglobin: 13.9 g/dL (ref 13.0–17.0)
Immature Granulocytes: 1 %
Lymphocytes Relative: 25 %
Lymphs Abs: 2.7 10*3/uL (ref 0.7–4.0)
MCH: 26.4 pg (ref 26.0–34.0)
MCHC: 32 g/dL (ref 30.0–36.0)
MCV: 82.7 fL (ref 80.0–100.0)
Monocytes Absolute: 1.3 10*3/uL — ABNORMAL HIGH (ref 0.1–1.0)
Monocytes Relative: 13 %
Neutro Abs: 5.3 10*3/uL (ref 1.7–7.7)
Neutrophils Relative %: 48 %
Platelets: 267 10*3/uL (ref 150–400)
RBC: 5.26 MIL/uL (ref 4.22–5.81)
RDW: 13.4 % (ref 11.5–15.5)
WBC: 10.7 10*3/uL — ABNORMAL HIGH (ref 4.0–10.5)
nRBC: 0 % (ref 0.0–0.2)

## 2023-12-15 LAB — COMPREHENSIVE METABOLIC PANEL WITH GFR
ALT: 40 U/L (ref 0–44)
AST: 49 U/L — ABNORMAL HIGH (ref 15–41)
Albumin: 3.9 g/dL (ref 3.5–5.0)
Alkaline Phosphatase: 55 U/L (ref 38–126)
Anion gap: 9 (ref 5–15)
BUN: 30 mg/dL — ABNORMAL HIGH (ref 6–20)
CO2: 26 mmol/L (ref 22–32)
Calcium: 9.6 mg/dL (ref 8.9–10.3)
Chloride: 100 mmol/L (ref 98–111)
Creatinine, Ser: 1.36 mg/dL — ABNORMAL HIGH (ref 0.61–1.24)
GFR, Estimated: >60 mL/min (ref >60)
Glucose, Bld: 140 mg/dL — ABNORMAL HIGH (ref 70–99)
Potassium: 3.8 mmol/L (ref 3.5–5.1)
Sodium: 135 mmol/L (ref 135–145)
Total Bilirubin: 1 mg/dL (ref 0.0–1.2)
Total Protein: 6.5 g/dL (ref 6.5–8.1)

## 2023-12-15 LAB — ETHANOL: Alcohol, Ethyl (B): <15 mg/dL (ref <15)

## 2023-12-15 MED ORDER — HALOPERIDOL LACTATE 5 MG/ML IJ SOLN: 10.0000 mg | Freq: Three times a day (TID) | INTRAMUSCULAR | Status: DC | PRN

## 2023-12-15 MED ORDER — HYDROXYZINE HCL 25 MG PO TABS
25.0000 mg | ORAL_TABLET | Freq: Three times a day (TID) | ORAL | Status: DC | PRN
  Administered 2023-12-15 – 2023-12-19 (×5): 25 mg via ORAL
  Filled 2023-12-15 (×5): qty 1

## 2023-12-15 MED ORDER — AMLODIPINE BESYLATE 10 MG PO TABS
10.0000 mg | ORAL_TABLET | Freq: Every day | ORAL | Status: DC
  Administered 2023-12-16 – 2023-12-19 (×4): 10 mg via ORAL
  Filled 2023-12-15: qty 1
  Filled 2023-12-15 (×4): qty 2

## 2023-12-15 MED ORDER — OLANZAPINE 10 MG PO TABS
10.0000 mg | ORAL_TABLET | Freq: Every day | ORAL | Status: DC
  Administered 2023-12-16 – 2023-12-18 (×3): 10 mg via ORAL
  Filled 2023-12-15 (×3): qty 1

## 2023-12-15 MED ORDER — HALOPERIDOL 5 MG PO TABS: 5.0000 mg | ORAL_TABLET | Freq: Three times a day (TID) | ORAL | Status: DC | PRN

## 2023-12-15 MED ORDER — ACETAMINOPHEN 325 MG PO TABS
650.0000 mg | ORAL_TABLET | Freq: Four times a day (QID) | ORAL | Status: DC | PRN
  Administered 2023-12-16 – 2023-12-19 (×3): 650 mg via ORAL
  Filled 2023-12-15 (×3): qty 2

## 2023-12-15 MED ORDER — LORAZEPAM 2 MG/ML IJ SOLN: 2.0000 mg | Freq: Three times a day (TID) | INTRAMUSCULAR | Status: DC | PRN

## 2023-12-15 MED ORDER — TRAZODONE HCL 50 MG PO TABS
50.0000 mg | ORAL_TABLET | Freq: Every evening | ORAL | Status: DC | PRN
  Administered 2023-12-16 – 2023-12-19 (×4): 50 mg via ORAL
  Filled 2023-12-15 (×5): qty 1

## 2023-12-15 MED ORDER — DIPHENHYDRAMINE HCL 25 MG PO CAPS: 50.0000 mg | ORAL_CAPSULE | Freq: Three times a day (TID) | ORAL | Status: DC | PRN

## 2023-12-15 MED ORDER — DIPHENHYDRAMINE HCL 50 MG/ML IJ SOLN: 50.0000 mg | Freq: Three times a day (TID) | INTRAMUSCULAR | Status: DC | PRN

## 2023-12-15 MED ORDER — OLANZAPINE 10 MG PO TABS
20.0000 mg | ORAL_TABLET | Freq: Every day | ORAL | Status: AC
  Administered 2023-12-16 – 2023-12-18 (×3): 20 mg via ORAL
  Filled 2023-12-15 (×3): qty 2

## 2023-12-15 MED ORDER — ALUM & MAG HYDROXIDE-SIMETH 200-200-20 MG/5ML PO SUSP: 30.0000 mL | ORAL | Status: DC | PRN

## 2023-12-15 MED ORDER — MAGNESIUM HYDROXIDE 400 MG/5ML PO SUSP: 30.0000 mL | Freq: Every day | ORAL | Status: DC | PRN

## 2023-12-15 MED ORDER — HALOPERIDOL LACTATE 5 MG/ML IJ SOLN: 5.0000 mg | Freq: Three times a day (TID) | INTRAMUSCULAR | Status: DC | PRN

## 2023-12-15 NOTE — ED Notes (Signed)
 Pt A&O x 4, presents with suicidal ideations without a plan.  Denies AVH & HI.  Pt report he is homeless. Calm & cooperative, no distress noted.  Monitoring for safety.

## 2023-12-15 NOTE — Progress Notes (Signed)
   12/15/23 1600  Psych Admission Type (Psych Patients Only)  Admission Status Voluntary  Psychosocial Assessment  Patient Complaints Depression  Eye Contact Intense  Facial Expression Flat  Affect Depressed  Speech Soft;Slow  Interaction Cautious;Guarded  Motor Activity Slow  Appearance/Hygiene In scrubs  Behavior Characteristics Guarded  Mood Sad  Thought Process  Coherency WDL  Content WDL  Delusions None reported or observed  Perception WDL  Hallucination None reported or observed  Judgment Poor  Confusion None  Danger to Self  Current suicidal ideation? Denies  Self-Injurious Behavior No self-injurious ideation or behavior indicators observed or expressed   Agreement Not to Harm Self Yes  Description of Agreement verbal  Danger to Others  Danger to Others None reported or observed  Danger to Others Abnormal  Harmful Behavior to others No threats or harm toward other people  Destructive Behavior No threats or harm toward property

## 2023-12-15 NOTE — BH Assessment (Signed)
 Comprehensive Clinical Assessment (CCA) Note   12/15/2023 Aaron Rubio [id number]  Disposition: Aaron Kemp Bobbitt,NP recommends continuous observation. Pt will be seen by psychiatry in AM.  The patient demonstrates the following risk factors for suicide: Chronic risk factors for suicide include: psychiatric disorder of Schizophrenia. Acute risk factors for suicide include: unemployment and social withdrawal/isolation. Protective factors for this patient include: n/a. Considering these factors, the overall suicide risk at this point appears to be low. Patient is not appropriate for outpatient follow up.     Pt is a 43 yo male who presents to [facility] voluntarily unaccompanied.Pt reports that he has hx of schizophrenia and he has not had any medications since Tuesday. Pt reports that he was hospitalized at [facility] and was recently discharged. Pt reports that he has ongoing SI thoughts without a plan. Pt reports that he is currently homeless. Pt denies HI and AVH. Pt reports difficulty sleeping. Pt reports that he is not keeping up with his hyigene. Pt states that he feels like he needs to be admitted and connected to the behavioral health hospital. Pt denies being active with outpatient services.  Upon evaluation with this clinician, the patient is alert, oriented x 3, and cooperative. Speech is clear. Pt appears casual. Eye contact is fair. Mood is bizarre and affect is congruent with mood. Pt presents with disorganized thoughts and had difficulty answering questions appropriately. There is no indication that the patient is responding to internal stimuli. No delusions elicited during this assessment.      Chief Complaint:  Chief Complaint  Patient presents with   Suicidal   Visit Diagnosis: Schizophrenia     CCA Screening, Triage and Referral (STR)  Patient Reported Information How did you hear about us ? Self  What Is the Reason for Your Visit/Call Today? Pt is a 43 yo male who  presents to [facility] voluntarily unaccompanied.Pt reports that he has hx of schizophrenia and he has not had any medications since Tuesday. Pt reports that he was hospitalized at [facility] and was recently discharged. Pt reports that he has ongoing SI thoughts without a plan. Pt reports that he is currently homeless. Pt denies HI and AVH. Pt reports difficulty sleeping. Pt reports that he is not keeping up with his hyigene. Pt states that he feels like he needs to be admitted and connected to the behavioral health hospital. Pt denies being active with outpatient services.  How Long Has This Been Causing You Problems? > than 6 months  What Do You Feel Would Help You the Most Today? Treatment for Depression or other mood problem; Stress Management; Medication(s)   Have You Recently Had Any Thoughts About Hurting Yourself? Yes  Are You Planning to Commit Suicide/Harm Yourself At This time? No   Flowsheet Row ED from 12/14/2023 in [facility] Most recent reading at 12/15/2023 12:18 AM ED from 12/14/2023 in [facility] Emergency Department at [facility] Most recent reading at 12/14/2023  1:32 PM ED from 12/06/2023 in [facility] Most recent reading at 12/06/2023  1:17 PM  C-SSRS RISK CATEGORY Low Risk No Risk No Risk    Have you Recently Had Thoughts About Hurting Someone Aaron Rubio? No (UTA)  Are You Planning to Harm Someone at This Time? No  Explanation: Denies HI   Have You Used Any Alcohol or Drugs in the Past 24 Hours? No  How Long Ago Did You Use Drugs or Alcohol?n/a What Did You Use and How Much?  Pt denies etoh and drug use .   Do You Currently Have a Therapist/Psychiatrist? No  Name of Therapist/Psychiatrist: Name of Therapist/Psychiatrist: n/a   Have You Been Recently Discharged From Any Office Practice or Programs? No  Explanation of Discharge From Practice/Program: n/a     CCA Screening Triage Referral  Assessment Type of Contact: Face-to-Face  Telemedicine Service Delivery:   Is this Initial or Reassessment?   Date Telepsych consult ordered in [facility]:    Time Telepsych consult ordered in [facility]:    Location of Assessment: [facility] Assessment Services  Provider Location: [facility] Assessment Services   Collateral Involvement: None   Does Patient Have a Automotive engineer Guardian? No  Legal Guardian Contact Information: n/a  Copy of Legal Guardianship Form: -- (n/a)  Legal Guardian Notified of Arrival: -- (n/a)  Legal Guardian Notified of Pending Discharge: -- (n/a)  If Minor and Not Living with Parent(s), Who has Custody? n/a  Is CPS involved or ever been involved? Never  Is APS involved or ever been involved? Never   Patient Determined To Be At Risk for Harm To Self or Others Based on Review of Patient Reported Information or Presenting Complaint? Yes, for Self-Harm  Method: No Plan  Availability of Means: No access or NA  Intent: Vague intent or NA  Notification Required: No need or identified person  Additional Information for Danger to Others Potential: -- (n/a)  Additional Comments for Danger to Others Potential: n/a  Are There Guns or Other Weapons in Your Home? No  Types of Guns/Weapons: Denies access  Are These Weapons Safely Secured?                            No  Who Could Verify You Are Able To Have These Secured: Denies access  Do You Have any Outstanding Charges, Pending Court Dates, Parole/Probation? Denies pending legal charges  Contacted To Inform of Risk of Harm To Self or Others: -- (n/a)    Does Patient Present under Involuntary Commitment? No    [place] of Residence: Aaron Rubio (homeless)   Patient Currently Receiving the Following Services: Not Receiving Services   Determination of Need: Urgent (48 hours)   Options For Referral: Inpatient Hospitalization     CCA Biopsychosocial Patient Reported Schizophrenia/Schizoaffective  Diagnosis in Past: Yes   Strengths: Able to accept help   Mental Health Symptoms Depression:  Irritability; Change in energy/activity; Difficulty Concentrating; Hopelessness   Duration of Depressive symptoms: Duration of Depressive Symptoms: Greater than two weeks   Mania:  None   Anxiety:   Tension; Restlessness; Worrying; Sleep; Fatigue; Difficulty concentrating   Psychosis:  None   Duration of Psychotic symptoms: Duration of Psychotic Symptoms: Greater than six months   Trauma:  None (UTA)   Obsessions:  None   Compulsions:  None   Inattention:  Disorganized   Hyperactivity/Impulsivity:  Feeling of restlessness   Oppositional/Defiant Behaviors:  None   Emotional Irregularity:  Recurrent suicidal behaviors/gestures/threats   Other Mood/Personality Symptoms:  none    Mental Status Exam Appearance and self-care  Stature:  Tall   Weight:  Average weight   Clothing:  Careless/inappropriate   Grooming:  Neglected   Cosmetic use:  None   Posture/gait:  Normal   Motor activity:  Restless   Sensorium  Attention:  Confused   Concentration:  Anxiety interferes; Focuses on irrelevancies; Scattered   Orientation:  Person; Place; Situation   Recall/memory:  Defective in  Short-term; Defective in Remote   Affect and Mood  Affect:  Labile; Depressed; Flat   Mood:  Irritable; Negative; Depressed   Relating  Eye contact:  Avoided   Facial expression:  Anxious; Depressed   Attitude toward examiner:  Irritable; Guarded; Suspicious; Cooperative   Thought and Language  Speech flow: Flight of Ideas; Garbled; Soft   Thought content:  Delusions   Preoccupation:  Other (Comment); Ruminations (delusions)   Hallucinations:  Auditory; Visual   Organization:  Disorganized   Company secretary of Knowledge:  Poor   Intelligence:  Needs investigation Industrial/product designer)   Abstraction:  Functional   Judgement:  Impaired   Reality Testing:  Distorted   Insight:   Gaps; Lacking   Decision Making:  Impulsive   Social Functioning  Social Maturity:  Irresponsible; Impulsive   Social Judgement:  Chief of Staff; Heedless   Stress  Stressors:  Other (Comment) (UTA- Pt was not able to answer some questions due to his psychotic symptoms.)   Coping Ability:  Deficient supports; Exhausted; Overwhelmed   Skill Deficits:  Communication; Decision making; Interpersonal; Responsibility; Self-control; Self-care   Supports:  Support needed     Religion: Religion/Spirituality Are You A Religious Person?: No (UTA- Pt was not able to answer some questions due to his psychotic symptoms.)  Leisure/Recreation: Leisure / Recreation Do You Have Hobbies?: No (UTA-Pt was not able to answer some questions due to his psychotic symptoms.) Leisure and Hobbies: UTA-Pt was not able to answer some questions due to his psychotic symptoms.  Exercise/Diet: Exercise/Diet Do You Exercise?:  (UTA-Pt was not able to answer some questions due to his psychotic symptoms.) Have You Gained or Lost A Significant Amount of Weight in the Past Six Months?:  (UTA) Do You Follow a Special Diet?:  (UTA) Do You Have Any Trouble Sleeping?: Yes Explanation of Sleeping Difficulties: Pt reports difficulty going to sleep.   CCA Employment/Education Employment/Work Situation: Employment / Work Situation Employment Situation: Unemployed Patient's Job has Been Impacted by Current Illness: No Has Patient ever Been in Equities trader?: No  Education: Education Is Patient Currently Attending School?: No Last Grade Completed: 9 (Per chart- Pt was not able to answer some questions due to his psychotic symptoms.) Did You Attend College?:  (UTA-Pt was not able to answer some questions due to his psychotic symptoms.) Did You Have An Individualized Education Program (IIEP):  (UTA- Pt was not able to answer some questions due to his psychotic symptoms.) Did You Have Any Difficulty At School?:   (UTA-Pt was not able to answer some questions due to his psychotic symptoms.)   CCA Family/Childhood History Family and Relationship History: Family history Marital status: Single Does patient have children?: Yes How many children?: 1 (per chart) How is patient's relationship with their children?: Pt was not able to answer some questions due to his psychotic symptoms.  Childhood History:  Childhood History By whom was/is the patient raised?: Other (Comment) (Pt was not able to answer some questions due to his psychotic symptoms.) Did patient suffer any verbal/emotional/physical/sexual abuse as a child?: No Did patient suffer from severe childhood neglect?: No Has patient ever been sexually abused/assaulted/raped as an adolescent or adult?: No Was the patient ever a victim of a crime or a disaster?: No Witnessed domestic violence?: No Has patient been affected by domestic violence as an adult?: No       CCA Substance Use Alcohol/Drug Use:  ASAM's:  Six Dimensions of Multidimensional Assessment  Dimension 1:  Acute Intoxication and/or Withdrawal Potential:      Dimension 2:  Biomedical Conditions and Complications:      Dimension 3:  Emotional, Behavioral, or Cognitive Conditions and Complications:     Dimension 4:  Readiness to Change:     Dimension 5:  Relapse, Continued use, or Continued Problem Potential:     Dimension 6:  Recovery/Living Environment:     ASAM Severity Score:    ASAM Recommended Level of Treatment:     Substance use Disorder (SUD)    Recommendations for Services/Supports/Treatments:    Disposition Recommendation per psychiatric provider: Continuous observation    DSM5 Diagnoses: Patient Active Problem List   Diagnosis Date Noted   Chronic paranoid schizophrenia (HCC) 10/31/2023   Passive suicidal ideations 05/05/2023   Psychosis (HCC) 05/05/2023   Polysubstance abuse (HCC) 05/05/2023     Referrals to  Alternative Service(s): Referred to Alternative Service(s):   Place:   Date:   Time:    Referred to Alternative Service(s):   Place:   Date:   Time:    Referred to Alternative Service(s):   Place:   Date:   Time:    Referred to Alternative Service(s):   Place:   Date:   Time:     [name], [place], [facility]

## 2023-12-15 NOTE — Plan of Care (Signed)
   Problem: Education: Goal: Knowledge of [place] Education information/materials will improve Outcome: Progressing   Problem: Safety: Goal: Periods of time without injury will increase Outcome: Progressing

## 2023-12-15 NOTE — Discharge Instructions (Signed)
Transferred to [facility]

## 2023-12-15 NOTE — ED Notes (Signed)
 Report called to Alana, RN [facility].

## 2023-12-15 NOTE — Progress Notes (Signed)
 Admission Note: Patient is a 43 year old male admitted voluntary for SI. Patient stated, " I feel depressed" Patient denied any plan, currently denies SI/ HI. Patient stated he has history of schizophrenia and has problems obtaining his meds. He reports he is currently homeless. Denies any pain. Skin and personal belongings completed. Skin dry and intact. No contraband found. Routine safety check initiated. Patient oriented to the unit, staff and room. Verbal understanding of unit rules/protocols. Patient safe on unit.

## 2023-12-15 NOTE — ED Notes (Signed)
 Aaron Rubio transferred to [facility] per NP order. Discussed with the patient and all questions fully answered. An EMTALA and Med Necessity forms were printed and to be given to the receiving nurse. All belongings returned. Patient escorted out and transferred via safe transport.  [name]  12/15/2023 3:20 PM

## 2023-12-15 NOTE — Group Note (Signed)
 Date:  12/15/2023 Time:  8:23 PM  Group Topic/Focus:  Wrap-Up Group:   The focus of this group is to help patients review their daily goal of treatment and discuss progress on daily workbooks.    Participation Level:  Did Not Attend  Aaron Rubio 12/15/2023, 8:23 PM

## 2023-12-15 NOTE — ED Provider Notes (Signed)
 FBC/OBS ASAP Discharge Summary  Date and Time: 12/15/2023 2:06 PM  Name: Aaron Rubio  MRN:  [id number]   Discharge Diagnoses:  Final diagnoses:  Paranoid schizophrenia [facility])  Homelessness    Subjective: My life was becoming to an end   Stay Summary: Per triage on 12/14/2023, Pt is a 43 yo male who presents to [facility] voluntarily unaccompanied.Pt reports that he has hx of schizophrenia and he has not had any medications since Tuesday. Pt reports that he was hospitalized at [facility] and was recently discharged. Pt reports that he has ongoing SI thoughts without a plan. Pt reports that he is currently homeless. Pt denies HI and AVH. Pt reports difficulty sleeping. Pt reports that he is not keeping up with his hyigene. Pt states that he feels like he needs to be admitted and connected to the behavioral health hospital. Pt denies being active with outpatient services.    Chart reviewed and discussed with attending psychiatrist, Dr Kathlen Para.    [name] is seen on the [facility] unit. He arouses easily and engages in today's assessment. He states he came in for evaluation due to my life was becoming to an end. States this was triggered by erratic behavior. He does not elaborate on erratic behavior. Chart review indicates he was admitted to [facility] June 6 and was transferred to [facility]. States he remained there a few days. He states he was told to go to [facility] and they said they didn't do that there. When asked what he was referring to, he states what the hospital said, I don't know, I hear things and then other things come in and there are things that are there. He states he did not continue any mental health medications after hospitalization. He is currently displaying poor insight, tangential thought processes and appears to be attending to internal stimuli. He endorses passive suicidal ideation without plan or intent. He denies homicidal ideation. Inpatient  hospitalization is recommended.    Total Time spent with patient: 15 minutes  Past Psychiatric History: Paranoid schizophrenia, polysubstance use Hospitalization: [facility] (June, 2025), [facility] April, 2025 Past Medical History: HTN Family History: none reported Family Psychiatric   No family history on file.      Social History: Polysubstance use. Unhoused.  reports that he has been smoking cigarettes. He does not have any smokeless tobacco history on file.He reports current drug use of cocaine and marijuana.    Additional Social History:  Pain Medications: See MAR Prescriptions: See MAR Over the Counter: See MAR History of alcohol / drug use?: No history of alcohol / drug abuse Longest period of sobriety (when/how long): Pt denies etoh/ drug use Withdrawal Symptoms:  (n/a) Name of Substance 1: N/A Name of Substance 2: N/A Tobacco Cessation:  Prescription not provided because: patient transferred to inpatient facility  Current Medications:  Current Facility-Administered Medications  Medication Dose Route Frequency Provider Last Rate Last Admin   acetaminophen  (TYLENOL ) tablet 650 mg  650 mg Oral Q6H PRN Bobbitt, Shalon E, NP   650 mg at 12/15/23 0850   alum & mag hydroxide-simeth (MAALOX/MYLANTA) 200-200-20 MG/5ML suspension 30 mL  30 mL Oral Q4H PRN Bobbitt, Shalon E, NP       amLODipine  (NORVASC ) tablet 10 mg  10 mg Oral Daily Bobbitt, Shalon E, NP   10 mg at 12/15/23 0820   haloperidol  (HALDOL ) tablet 5 mg  5 mg Oral TID PRN Bobbitt, Shalon E, NP  And   diphenhydrAMINE  (BENADRYL ) capsule 50 mg  50 mg Oral TID PRN Bobbitt, Shalon E, NP       haloperidol  lactate (HALDOL ) injection 5 mg  5 mg Intramuscular TID PRN Bobbitt, Shalon E, NP       And   diphenhydrAMINE  (BENADRYL ) injection 50 mg  50 mg Intramuscular TID PRN Bobbitt, Shalon E, NP       And   LORazepam  (ATIVAN ) injection 2 mg  2 mg Intramuscular TID PRN Bobbitt, Shalon E, NP       haloperidol  lactate (HALDOL )  injection 10 mg  10 mg Intramuscular TID PRN Bobbitt, Shalon E, NP       And   diphenhydrAMINE  (BENADRYL ) injection 50 mg  50 mg Intramuscular TID PRN Bobbitt, Shalon E, NP       And   LORazepam  (ATIVAN ) injection 2 mg  2 mg Intramuscular TID PRN Bobbitt, Shalon E, NP       hydrOXYzine  (ATARAX ) tablet 25 mg  25 mg Oral TID PRN Bobbitt, Shalon E, NP       magnesium  hydroxide (MILK OF MAGNESIA) suspension 30 mL  30 mL Oral Daily PRN Bobbitt, Shalon E, NP       OLANZapine  (ZYPREXA ) tablet 10 mg  10 mg Oral Daily Bobbitt, Shalon E, NP   10 mg at 12/15/23 4010   OLANZapine  (ZYPREXA ) tablet 20 mg  20 mg Oral QHS Bobbitt, Shalon E, NP       traZODone  (DESYREL ) tablet 50 mg  50 mg Oral QHS PRN Bobbitt, Shalon E, NP       Current Outpatient Medications  Medication Sig Dispense Refill   sertraline (ZOLOFT) 50 MG tablet Take 50 mg by mouth daily. (Patient not taking: Reported on 12/15/2023)      PTA Medications:  Facility Ordered Medications  Medication   [COMPLETED] ketorolac (TORADOL) 30 MG/ML injection 30 mg   [COMPLETED] methocarbamol  (ROBAXIN ) tablet 500 mg   [COMPLETED] acetaminophen  (TYLENOL ) tablet 650 mg   acetaminophen  (TYLENOL ) tablet 650 mg   alum & mag hydroxide-simeth (MAALOX/MYLANTA) 200-200-20 MG/5ML suspension 30 mL   magnesium  hydroxide (MILK OF MAGNESIA) suspension 30 mL   haloperidol  (HALDOL ) tablet 5 mg   And   diphenhydrAMINE  (BENADRYL ) capsule 50 mg   haloperidol  lactate (HALDOL ) injection 5 mg   And   diphenhydrAMINE  (BENADRYL ) injection 50 mg   And   LORazepam  (ATIVAN ) injection 2 mg   haloperidol  lactate (HALDOL ) injection 10 mg   And   diphenhydrAMINE  (BENADRYL ) injection 50 mg   And   LORazepam  (ATIVAN ) injection 2 mg   hydrOXYzine  (ATARAX ) tablet 25 mg   traZODone  (DESYREL ) tablet 50 mg   OLANZapine  (ZYPREXA ) tablet 10 mg   OLANZapine  (ZYPREXA ) tablet 20 mg   amLODipine  (NORVASC ) tablet 10 mg   PTA Medications  Medication Sig   sertraline (ZOLOFT) 50 MG  tablet Take 50 mg by mouth daily. (Patient not taking: Reported on 12/15/2023)        No data to display          Flowsheet Row ED from 12/14/2023 in [facility] Most recent reading at 12/15/2023 12:18 AM ED from 12/14/2023 in [facility] Most recent reading at 12/14/2023  1:32 PM ED from 12/06/2023 in [facility] Most recent reading at 12/06/2023  1:17 PM  C-SSRS RISK CATEGORY Low Risk No Risk No Risk    Musculoskeletal  Strength & Muscle Tone: not assessed -  he is laying on bed during this assessment Gait & Station: not assessed - he is laying on bed during this assessment Patient leans: N/A  Psychiatric Specialty Exam  Presentation  General Appearance:  Fairly Groomed  Eye Contact: Fair  Speech: Clear and Coherent  Speech Volume: Decreased  Handedness: Right   Mood and Affect  Mood: Irritable  Affect: Flat   Thought Process  Thought Processes: Disorganized  Descriptions of Associations:Tangential  Orientation:Partial  Thought Content:Tangential  Diagnosis of Schizophrenia or Schizoaffective disorder in past: Yes  Duration of Psychotic Symptoms: Greater than six months   Hallucinations:Hallucinations: Auditory Description of Auditory Hallucinations: hears peple bragging Description of Visual Hallucinations: denies  Ideas of Reference:Paranoia  Suicidal Thoughts:Suicidal Thoughts: Yes, Passive SI Passive Intent and/or Plan: Without Intent; Without Plan  Homicidal Thoughts:Homicidal Thoughts: No   Sensorium  Memory: Immediate Poor; Remote Poor; Recent Fair; Remote Fair  Judgment: Poor  Insight: Poor   Executive Functions  Concentration: Fair  Attention Span: Fair  Recall: Poor  Fund of Knowledge: Fair  Language: Fair   Psychomotor Activity  Psychomotor Activity: Psychomotor Activity: Normal   Assets   Assets: Communication Skills; Desire for Improvement; Resilience   Sleep  Sleep: Sleep: Fair  No Safety Checks orders active in given range  Nutritional Assessment (For OBS and FBC admissions only) Has the patient had a weight loss or gain of 10 pounds or more in the last 3 months?: No Has the patient had a decrease in food intake/or appetite?: No Does the patient have dental problems?: No Does the patient have eating habits or behaviors that may be indicators of an eating disorder including binging or inducing vomiting?: No Has the patient recently lost weight without trying?: 0 Has the patient been eating poorly because of a decreased appetite?: 0 Malnutrition Screening Tool Score: 0    Physical Exam  Physical Exam Vitals and nursing note reviewed.  HENT:     Head: Normocephalic.     Mouth/Throat:     Mouth: Mucous membranes are moist.    Cardiovascular:     Rate and Rhythm: Normal rate.  Pulmonary:     Effort: Pulmonary effort is normal.    Skin:    General: Skin is warm and dry.    Neurological:     Mental Status: He is alert and oriented to person, place, and time.      Review of Systems  Constitutional:  Negative for fever.  HENT:  Negative for congestion.   Respiratory:  Negative for cough and shortness of breath.   Cardiovascular:  Negative for chest pain and palpitations.  Gastrointestinal:  Negative for diarrhea, nausea and vomiting.  Psychiatric/Behavioral:  Positive for depression, hallucinations, substance abuse and suicidal ideas.   Blood pressure 115/75, pulse 82, temperature 97.6 F (36.4 C), temperature source Oral, resp. rate 18, SpO2 100%. There is no height or weight on file to calculate BMI.  Demographic Factors:  Male, Low socioeconomic status, and Unemployed  Loss Factors: NA  Historical Factors: Impulsivity  Risk Reduction Factors:   NA  Continued Clinical Symptoms:  Schizophrenia:   Paranoid or undifferentiated  type  Cognitive Features That Contribute To Risk:  Closed-mindedness    Suicide Risk:  Mild:  Suicidal ideation of limited frequency, intensity, duration, and specificity.  There are no identifiable plans, no associated intent, mild dysphoria and related symptoms, good self-control (both objective and subjective assessment), few other risk factors, and identifiable protective factors, including available and accessible social support.  Medication  management Continue olanzapine  10 mg PO QAM for mood Continue olanzapine  20 mg PO at bedtime for mood Continue trazodone  50 mg PO prn at bedtime for sleep  Disposition: Inpatient hospitalization recommended. Accepted to [facility] to Dr Thirza Fleet Rm [id number]   Addie Holstein, PMHNP-BC, FNP-BC  12/15/2023, 2:06 PM

## 2023-12-15 NOTE — Progress Notes (Signed)
 Pt is awake, alert and oriented. Pt is guarded, arrogant and argumentative. Pt complained of tooth pain 10/10. No signs of acute distress noted. PRN Acetaminophen  and scheduled meds administered per order. Pt responded maybe when asked if he was suicidal. Pt denies current HI/AVH. Staff will monitor for pt's safety.

## 2023-12-15 NOTE — ED Notes (Signed)
 Pt sleeping at present, no distress noted.  Monitoring for safety.

## 2023-12-16 DIAGNOSIS — Z5900 Homelessness unspecified: Secondary | ICD-10-CM

## 2023-12-16 DIAGNOSIS — R45851 Suicidal ideations: Secondary | ICD-10-CM

## 2023-12-16 NOTE — Progress Notes (Signed)
 During this shift, administered PRN Hydroxyzine  for anxiety and Trazodone  for sleep per [facility] per patient request.

## 2023-12-16 NOTE — Group Note (Signed)
 LCSW Group Therapy Note  Group Date: 12/16/2023 Start Time: 1100 End Time: 1200   Type of Therapy and Topic:  Group Therapy - Healthy vs Unhealthy Coping Skills  Participation Level:  Did Not Attend   Description of Group The focus of this group was to determine what unhealthy coping techniques typically are used by group members and what healthy coping techniques would be helpful in coping with various problems. Patients were guided in becoming aware of the differences between healthy and unhealthy coping techniques. Patients were asked to identify 2-3 healthy coping skills they would like to learn to use more effectively.  Therapeutic Goals Patients learned that coping is what human beings do all day long to deal with various situations in their lives Patients defined and discussed healthy vs unhealthy coping techniques Patients identified their preferred coping techniques and identified whether these were healthy or unhealthy Patients determined 2-3 healthy coping skills they would like to become more familiar with and use more often. Patients provided support and ideas to each other   Summary of Patient Progress:  Patient did not attend.    Therapeutic Modalities Cognitive Behavioral Therapy Motivational Interviewing  Roselle Conner, LCSWA 12/16/2023  11:50 AM

## 2023-12-16 NOTE — Progress Notes (Signed)
   12/15/23 2036  Psych Admission Type (Psych Patients Only)  Admission Status Voluntary  Psychosocial Assessment  Patient Complaints Depression  Eye Contact Intense  Facial Expression Flat  Affect Depressed  Speech Soft  Interaction Cautious;Guarded  Motor Activity Slow  Appearance/Hygiene In scrubs  Behavior Characteristics Guarded  Mood Depressed  Thought Process  Coherency WDL  Content WDL  Delusions None reported or observed  Perception WDL  Hallucination None reported or observed  Judgment Poor  Confusion None  Danger to Self  Current suicidal ideation? Denies  Danger to Others  Danger to Others None reported or observed

## 2023-12-16 NOTE — Progress Notes (Signed)
   12/16/23 2045  Psych Admission Type (Psych Patients Only)  Admission Status Voluntary  Psychosocial Assessment  Patient Complaints Depression  Eye Contact Fair  Facial Expression Flat  Affect Depressed  Speech Soft;Slow  Interaction Guarded  Motor Activity Slow  Appearance/Hygiene In scrubs  Behavior Characteristics Cooperative  Mood Depressed  Thought Process  Coherency WDL  Content WDL  Delusions None reported or observed  Perception WDL  Hallucination None reported or observed  Judgment UTA  Confusion None  Danger to Self  Current suicidal ideation? Denies  Danger to Others  Danger to Others None reported or observed   Progress note   D: Pt seen at med window. Pt was sleeping at beginning of shift. Pt denies SI, HI, AVH. Pt rates pain  0/10. Pt rates anxiety  0/10 and depression  7/10. Pt says he is depressed because it's Father's Day. When asked how many kids he has, pt replied, Too many. No other concerns noted at this time.  A: Pt provided support and encouragement. Pt given scheduled medication as prescribed. PRNs as appropriate. Q15 min checks for safety.   R: Pt safe on the unit. Will continue to monitor.

## 2023-12-16 NOTE — Group Note (Signed)
 Date:  12/16/2023 Time:  8:53 PM  Group Topic/Focus:  Wrap-Up Group:   The focus of this group is to help patients review their daily goal of treatment and discuss progress on daily workbooks.    Participation Level:  Did Not Attend  Participation Quality:  n/a  Affect:  n/a  Cognitive:  n/a  Insight: None  Engagement in Group:  n/a  Modes of Intervention:  n/a  Additional Comments:   Pt was encouraged but refused to attend wrap up group  Tovia Kisner A Wilhelmina Hark 12/16/2023, 8:53 PM

## 2023-12-16 NOTE — H&P (Addendum)
 Psychiatric Admission Assessment Adult  Patient Identification: Aaron Rubio MRN:  [id number] Date of Evaluation:  12/16/2023 Chief Complaint:  Paranoid schizophrenia (HCC) [F20.0] Principal Diagnosis: <principal problem not specified> Diagnosis:  Active Problems:   Passive suicidal ideations   Paranoid schizophrenia (HCC)   Homelessness  History of Present Illness: The patient is a 43 y/o male with a history of previously diagnosed schizophrenia who was admitted to inpatient psychiatry after presenting to the [facility] on 6/13 with complaints of foot and hip pain, not having his prescribed medications since earlier in the week, and suicidal ideation. In the ED he reported recently being admitted and discharged from [facility] and stated that he felt he needed psychiatric admission for treatment and stabilization. He was evaulated by a PNP there and documented to exhibit odd and disorganized thinking, possible thought blocking, and he endorsed AH and passive SI.   Since admission to the unit the patient has been behaviorally appropriate and cooperative. On assessment he was moderately disorganized and while he freely and spontaneously answered questions his responses were vague and either tangential, irrelevant, and uninformative. He stated he was trying to make it and get by but has been struggling. He stated that the drugs I've been using just aren't helping anymore and he has been trying to accept what has been happening. He stated that he ran out of money and has no resources. He reports feeling depressed and hopeless about his situation and chronic homelessness. He reports presenting to the ED for foot pain and states that this has improved a bit over the past day. He poorly described back and hip pain and denied any numbness or loss of sensation in his feet. He endorsed experiencing visual and auditory hallucinations over the past couple months but endorsed AH intermittently active  most recently. He had difficulty describing the content of the voices but made statements about being promised money and other things and not receiving the help/assistance he though he was going to. He reported a prior diagnosis of schizophrenia and while he could not identify the medications he had recently been prescribed he stated that medications have helped managed his symptoms, specifically depression and hallucinations.    Past Psychiatric History: Patient is a poor historian at this time. Per chart review he has a prior diagnosis of schizophrenia and a history of polysubstance abuse. He has been psychiatrically hospitalized multiple times throughout his life and 3+ times at [facility] over the past few months. He does not report a history of suicide attempts, but has had presentations for SI in the past. He has prior positive UDSs for cocaine.   Is the patient at risk to self? Yes.    Has the patient been a risk to self in the past 6 months? Yes.    Has the patient been a risk to self within the distant past? unknown Is the patient a risk to others? No.  Has the patient been a risk to others in the past 6 months? No.  Has the patient been a risk to others within the distant past? unknown  [place] Scale:  Flowsheet Row [facility] Most recent reading at 12/15/2023  4:00 PM ED from 12/14/2023 in [facility] Most recent reading at 12/15/2023 12:18 AM ED from 12/14/2023 in [facility] Most recent reading at 12/14/2023  1:32 PM  C-SSRS RISK CATEGORY Low Risk Low Risk No Risk  Alcohol Screening: 1. How often do you have a drink containing alcohol?: Never 2. How many drinks containing alcohol do you have on a typical day when you are drinking?: 1 or 2 3. How often do you have six or more drinks on one occasion?: Never AUDIT-C Score: 0 4. How often during  the last year have you found that you were not able to stop drinking once you had started?: Never 5. How often during the last year have you failed to do what was normally expected from you because of drinking?: Never 6. How often during the last year have you needed a first drink in the morning to get yourself going after a heavy drinking session?: Never 7. How often during the last year have you had a feeling of guilt of remorse after drinking?: Never 8. How often during the last year have you been unable to remember what happened the night before because you had been drinking?: Never 9. Have you or someone else been injured as a result of your drinking?: No 10. Has a relative or friend or a doctor or another health worker been concerned about your drinking or suggested you cut down?: No Alcohol Use Disorder Identification Test Final Score (AUDIT): 0 Alcohol Brief Interventions/Follow-up: Alcohol education/Brief advice Substance Abuse History in the last 12 months:  Yes.   Past Medical History:  Past Medical History:  Diagnosis Date   Hypertension    Schizophrenia (HCC)    History reviewed. No pertinent surgical history. Family History: History reviewed. No pertinent family history. Family Psychiatric  History: unable to obtain from patient Tobacco Screening:  Social History   Tobacco Use  Smoking Status Every Day   Current packs/day: 0.30   Types: Cigarettes  Smokeless Tobacco Not on file    BH Tobacco Counseling     Are you interested in Tobacco Cessation Medications?  No, patient refused Counseled patient on smoking cessation:  Refused/Declined practical counseling Reason Tobacco Screening Not Completed: Patient Refused Screening       Social History:  Social History   Substance and Sexual Activity  Alcohol Use Yes     Social History   Substance and Sexual Activity  Drug Use Yes    Additional Social History: Single, unemployed, minimal to no social support. He is  chronically homeless.            Allergies:  No Known Allergies Lab Results:  Results for orders placed or performed during the hospital encounter of 12/14/23 (from the past 48 hours)  CBC with Differential/Platelet     Status: Abnormal   Collection Time: 12/14/23 11:14 PM  Result Value Ref Range   WBC 10.7 (H) 4.0 - 10.5 K/uL   RBC 5.26 4.22 - 5.81 MIL/uL   Hemoglobin 13.9 13.0 - 17.0 g/dL   HCT 56.4 60.9 - 47.9 %   MCV 82.7 80.0 - 100.0 fL   MCH 26.4 26.0 - 34.0 pg   MCHC 32.0 30.0 - 36.0 g/dL   RDW 86.5 88.4 - 84.4 %   Platelets 267 150 - 400 K/uL   nRBC 0.0 0.0 - 0.2 %   Neutrophils Relative % 48 %   Neutro Abs 5.3 1.7 - 7.7 K/uL   Lymphocytes Relative 25 %   Lymphs Abs 2.7 0.7 - 4.0 K/uL   Monocytes Relative 13 %   Monocytes Absolute 1.3 (H) 0.1 - 1.0 K/uL   Eosinophils Relative 12 %   Eosinophils Absolute 1.2 (H) 0.0 - 0.5 K/uL  Basophils Relative 1 %   Basophils Absolute 0.1 0.0 - 0.1 K/uL   Immature Granulocytes 1 %   Abs Immature Granulocytes 0.06 0.00 - 0.07 K/uL    Comment: Performed at [facility], [address], [place], [place] [address]  Comprehensive metabolic panel     Status: Abnormal   Collection Time: 12/14/23 11:14 PM  Result Value Ref Range   Sodium 135 135 - 145 mmol/L   Potassium 3.8 3.5 - 5.1 mmol/L   Chloride 100 98 - 111 mmol/L   CO2 26 22 - 32 mmol/L   Glucose, Bld 140 (H) 70 - 99 mg/dL    Comment: Glucose reference range applies only to samples taken after fasting for at least 8 hours.   BUN 30 (H) 6 - 20 mg/dL   Creatinine, Ser 8.63 (H) 0.61 - 1.24 mg/dL   Calcium 9.6 8.9 - 89.6 mg/dL   Total Protein 6.5 6.5 - 8.1 g/dL   Albumin 3.9 3.5 - 5.0 g/dL   AST 49 (H) 15 - 41 U/L   ALT 40 0 - 44 U/L   Alkaline Phosphatase 55 38 - 126 U/L   Total Bilirubin 1.0 0.0 - 1.2 mg/dL   GFR, Estimated >39 >39 mL/min    Comment: (NOTE) Calculated using the CKD-EPI Creatinine Equation (2021)    Anion gap 9 5 - 15    Comment: Performed at  [facility], [address], [place], [place] [address]  Ethanol     Status: None   Collection Time: 12/14/23 11:14 PM  Result Value Ref Range   Alcohol, Ethyl (B) <15 <15 mg/dL    Comment: (NOTE) For medical purposes only. Performed at [facility], [address] [place], [place] [address]     Blood Alcohol level:  Lab Results  Component Value Date   [facility] <15 12/14/2023   ETH <15 12/06/2023    Metabolic Disorder Labs:  Lab Results  Component Value Date   HGBA1C 5.0 10/27/2023   MPG 96.8 10/27/2023   No results found for: PROLACTIN Lab Results  Component Value Date   CHOL 152 10/19/2023   TRIG 144 10/19/2023   HDL 57 10/19/2023   CHOLHDL 2.7 10/19/2023   VLDL 29 10/19/2023   LDLCALC 66 10/19/2023    Current Medications: Current Facility-Administered Medications  Medication Dose Route Frequency Provider Last Rate Last Admin   acetaminophen  (TYLENOL ) tablet 650 mg  650 mg Oral Q6H PRN Hobson, Fran E, NP   650 mg at 12/16/23 1703   alum & mag hydroxide-simeth (MAALOX/MYLANTA) 200-200-20 MG/5ML suspension 30 mL  30 mL Oral Q4H PRN Hobson, Fran E, NP       amLODipine  (NORVASC ) tablet 10 mg  10 mg Oral Daily Hobson, Fran E, NP   10 mg at 12/16/23 1108   haloperidol  (HALDOL ) tablet 5 mg  5 mg Oral TID PRN Hobson, Fran E, NP       And   diphenhydrAMINE  (BENADRYL ) capsule 50 mg  50 mg Oral TID PRN Hobson, Fran E, NP       haloperidol  lactate (HALDOL ) injection 10 mg  10 mg Intramuscular TID PRN Hobson, Fran E, NP       And   diphenhydrAMINE  (BENADRYL ) injection 50 mg  50 mg Intramuscular TID PRN Hobson, Fran E, NP       And   LORazepam  (ATIVAN ) injection 2 mg  2 mg Intramuscular TID PRN Hobson, Fran E, NP  haloperidol  lactate (HALDOL ) injection 5 mg  5 mg Intramuscular TID PRN Hobson, Fran E, NP       And   diphenhydrAMINE  (BENADRYL ) injection 50 mg  50 mg Intramuscular TID PRN Hobson, Fran E, NP       And   LORazepam  (ATIVAN ) injection 2 mg  2 mg  Intramuscular TID PRN Hobson, Fran E, NP       hydrOXYzine  (ATARAX ) tablet 25 mg  25 mg Oral TID PRN Hobson, Fran E, NP   25 mg at 12/15/23 2036   magnesium  hydroxide (MILK OF MAGNESIA) suspension 30 mL  30 mL Oral Daily PRN Hobson, Fran E, NP       OLANZapine  (ZYPREXA ) tablet 10 mg  10 mg Oral Daily Hobson, Fran E, NP   10 mg at 12/16/23 1108   OLANZapine  (ZYPREXA ) tablet 20 mg  20 mg Oral QHS Hobson, Fran E, NP       traZODone  (DESYREL ) tablet 50 mg  50 mg Oral QHS PRN Hobson, Fran E, NP   50 mg at 12/15/23 2036   PTA Medications: No medications prior to admission.      Musculoskeletal: Normal gait and station  Mental Status Exam: Appearance - Casually dressed, appropriate hygiene Behavior - calm, no psychomotor agitation Attitude - Polite, not guarded Mood - Depressed Affect - Restricted Thought Process - GD, but tangential to disorganized  Thought Content - No clear delusional thoughts are expressed SI/HI - Passive SI without plan, no HI Perceptions - Endorses vague AH that are not command Judgement/Insight - Fair, not grossly impaired at this time Fund of knowledge - WNL Language - No impairments    Assets  Assets: Manufacturing systems engineer; Desire for Improvement; Resilience   Sleep  Sleep: Sleep: Fair  Estimated Sleeping Duration (Last 24 Hours): 5.00-7.00 hours   Physical Exam: Physical Exam Vitals and nursing note reviewed.  Constitutional:      Appearance: Normal appearance.  HENT:     Head: Normocephalic and atraumatic.   Eyes:     Extraocular Movements: Extraocular movements intact.   Pulmonary:     Effort: Pulmonary effort is normal.   Musculoskeletal:        General: Normal range of motion.     Cervical back: Normal range of motion.   Neurological:     Mental Status: He is alert.    Review of Systems  Constitutional: Negative.   Respiratory: Negative.    Cardiovascular: Negative.   Gastrointestinal: Negative.   Musculoskeletal:   Positive for back pain.  Neurological: Negative.    Blood pressure 134/89, pulse 84, temperature 98.1 F (36.7 C), temperature source Oral, resp. rate 18, height 6' 2 (1.88 m), weight 104.8 kg, SpO2 100%. Body mass index is 29.66 kg/m.  Assessment and Plan: Mr. Tyrell Seifer is a 43 y/o male with previously diagnosed schizophrenia and a history of polysubstance abuse who was admitted to inpatient psychiatry for decompensated psychotic symptoms and suicidal ideation without intent.  # Schizophrenia, paranoid type - Decompensated/exacerbated - Restart olanzapine  10/20 mg BID  # HTN - amlodipine  10 mg daily  # Disposition - Estimated LOS - 5-7 days  # Safety and Monitoring: Voluntary admission to inpatient psychiatric unit for safety, stabilization and treatment Daily contact with patient to assess and evaluate symptoms and progress in treatment Patient's case to be discussed in multi-disciplinary team meeting Observation Level : q15 minute checks Vital signs: q12 hours  Physician Treatment Plan for Secondary Diagnosis: Active Problems:   Passive suicidal  ideations   Paranoid schizophrenia (HCC)   Homelessness  Long Term Goal(s): Improvement in symptoms so as ready for discharge  Short Term Goals: Ability to identify changes in lifestyle to reduce recurrence of condition will improve, Ability to disclose and discuss suicidal ideas, Ability to identify and develop effective coping behaviors will improve, and Ability to identify triggers associated with substance abuse/mental health issues will improve  I certify that inpatient services furnished can reasonably be expected to improve the patient's condition.    Oliva DELENA Salmon, MD 6/15/20257:37 PM

## 2023-12-16 NOTE — BHH Group Notes (Signed)
 Adult Psychoeducational Group Note  Date:  12/16/2023 Time:  10:06 AM  Group Topic/Focus:  Goals Group:   The focus of this group is to help patients establish daily goals to achieve during treatment and discuss how the patient can incorporate goal setting into their daily lives to aide in recovery.  Participation Level:  Did Not Attend  Participation Quality:  na  Affect: na   Cognitive:  na Insight: nna  Engagement in Group:  na  Modes of Intervention:  na  Additional Comments:  na  Anaston Koehn Lee 12/16/2023, 10:06 AM

## 2023-12-16 NOTE — Plan of Care (Signed)
   Problem: Education: Goal: Emotional status will improve Outcome: Not Progressing Goal: Mental status will improve Outcome: Not Progressing

## 2023-12-16 NOTE — BHH Suicide Risk Assessment (Signed)
 [facility] Admission Suicide Risk Assessment   Nursing information obtained from:  Patient Demographic factors:  Male, Low socioeconomic status Current Mental Status:  Suicidal ideation indicated by patient Loss Factors:  Financial problems / change in socioeconomic status Historical Factors:  Prior suicide attempts Risk Reduction Factors:  Positive coping skills or problem solving skills  Total Time spent with patient: 45 minutes Principal Problem: Paranoid schizophrenia (HCC) Diagnosis:  Principal Problem:   Paranoid schizophrenia (HCC) Active Problems:   Polysubstance abuse (HCC)  Subjective Data: Admitted for increasing psychotic symptoms and suicidal ideation. See H&P.  Continued Clinical Symptoms:  Alcohol Use Disorder Identification Test Final Score (AUDIT): 0 The Alcohol Use Disorders Identification Test, Guidelines for Use in Primary Care, Second Edition.  World Science writer [facility]). Score between 0-7:  no or low risk or alcohol related problems. Score between 8-15:  moderate risk of alcohol related problems. Score between 16-19:  high risk of alcohol related problems. Score 20 or above:  warrants further diagnostic evaluation for alcohol dependence and treatment.   CLINICAL FACTORS:   Schizophrenia:   Paranoid or undifferentiated type    Assets  Assets: Communication Skills; Desire for Improvement; Resilience    COGNITIVE FEATURES THAT CONTRIBUTE TO RISK:  Loss of executive function    SUICIDE RISK:   Moderate:  Frequent suicidal ideation with limited intensity, and duration, some specificity in terms of plans, no associated intent, good self-control, limited dysphoria/symptomatology, some risk factors present, and identifiable protective factors, including available and accessible social support.  PLAN OF CARE: Admission for evaluation, medication management, and stabilization. See H&P  I certify that inpatient services furnished can reasonably be expected to  improve the patient's condition.   Aaron Moss, MD 12/16/2023, 7:02 PM

## 2023-12-16 NOTE — Plan of Care (Signed)
   Problem: Education: Goal: Emotional status will improve Outcome: Progressing Goal: Mental status will improve Outcome: Progressing Goal: Verbalization of understanding the information provided will improve Outcome: Progressing

## 2023-12-16 NOTE — Progress Notes (Signed)
   12/16/23 1108  Psych Admission Type (Psych Patients Only)  Admission Status Voluntary  Psychosocial Assessment  Patient Complaints Depression  Eye Contact Fair  Facial Expression Flat  Affect Depressed  Speech Soft;Slow  Interaction Guarded;Minimal  Motor Activity Slow  Appearance/Hygiene In scrubs  Behavior Characteristics Guarded  Mood Depressed  Thought Process  Coherency WDL  Content WDL  Delusions None reported or observed  Perception WDL  Hallucination None reported or observed  Judgment Poor  Confusion None  Danger to Self  Current suicidal ideation? Denies  Agreement Not to Harm Self Yes  Description of Agreement Verbal  Danger to Others  Danger to Others None reported or observed  Danger to Others Abnormal  Harmful Behavior to others No threats or harm toward other people  Destructive Behavior No threats or harm toward property

## 2023-12-17 ENCOUNTER — Encounter (HOSPITAL_COMMUNITY): Payer: Self-pay

## 2023-12-17 DIAGNOSIS — F200 Paranoid schizophrenia: Secondary | ICD-10-CM | POA: Diagnosis not present

## 2023-12-17 NOTE — Group Note (Signed)
 Recreation Therapy Group Note   Group Topic:Healthy Decision Making  Group Date: 12/17/2023 Start Time: 1021 End Time: 1048 Facilitators: Tevion Laforge-McCall, LRT,CTRS Location: 500 Hall Dayroom   Group Topic: Decision Making, Problem Solving, Communication  Goal Area(s) Addresses:  Patient will effectively work with peer towards shared goal.  Patient will identify factors that guided their decision making.  Patient will pro-socially communicate ideas during group session.   Behavioral Response:   Intervention: Survival Scenario - pencil, paper  Activity: Patients were given a scenario that they were going to be stranded on a deserted [place] for several months before being rescued. Writer tasked them with making a list of 15 things they would choose to bring with them for survival. The list of items was prioritized most important to least. Each patient would come up with their own list, then work together to create a new list of 15 items while in a group of 3-5 peers. LRT discussed each person's list and how it differed from others. The debrief included discussion of priorities, good decisions versus bad decisions, and how it is important to think before acting so we can make the best decision possible. LRT tied the concept of effective communication among group members to patient's support systems outside of the hospital and its benefit post discharge.  Education: Pharmacist, community, Priorities, Support System, Discharge Planning    Education Outcome: Acknowledges education/In group clarification/Needs additional education   Affect/Mood: N/A   Participation Level: Did not attend    Clinical Observations/Individualized Feedback:     Plan: Continue to engage patient in RT group sessions 2-3x/week.   Krithik Mapel-McCall, LRT,CTRS 12/17/2023 11:17 AM

## 2023-12-17 NOTE — Progress Notes (Signed)
   12/17/23 0600  15 Minute Checks  Location Bedroom  Visual Appearance Calm  Behavior Composed  OTHER  Documented sleep last 24 hours 8  Sleep (Behavioral Health Patients Only)  Calculate sleep? (Click Yes once per 24 hr at 0600 safety check) Yes

## 2023-12-17 NOTE — Plan of Care (Signed)
°  Problem: Education: Goal: Emotional status will improve Outcome: Progressing Goal: Mental status will improve Outcome: Progressing Goal: Verbalization of understanding the information provided will improve Outcome: Progressing   Problem: Activity: Goal: Interest or engagement in activities will improve Outcome: Progressing Goal: Sleeping patterns will improve Outcome: Progressing   Problem: Coping: Goal: Ability to demonstrate self-control will improve Outcome: Progressing   Problem: Safety: Goal: Periods of time without injury will increase Outcome: Progressing

## 2023-12-17 NOTE — Progress Notes (Addendum)
 Collateral contact - [facility] program) - Rheba Cedar, [employer]AR Disability Specialist, Phone:  [phone]  CSW faxed the referral to [phone]  Mr. Milon Aloe confirmed that he received the document.  He asked to inform the financial counseling department at [facility] about the referral ([facility] has a contract with [facility]).  Mr. Milon Aloe said he has access to additional information; patient has been at [facility] in April.  He was also in [place].  When CSW spoke with patient this afternoon, he said he was recently at [facility] in [place] and [facility] in [place].     Sanae Willetts, LCSWA 12/17/2023
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
## 2023-12-17 NOTE — Group Note (Signed)
 Date:  12/17/2023 Time:  8:46 PM  Group Topic/Focus:  Wrap-Up Group:   The focus of this group is to help patients review their daily goal of treatment and discuss progress on daily workbooks.    Participation Level:  Did Not Attend  Participation Quality:  n/a  Affect:  n/a  Cognitive:  n/a  Insight: None  Engagement in Group:  None  Modes of Intervention:  n/a  Additional Comments:   Pt was encouraged to attend wrap up group but refused   Alam Guterrez A Alyza Artiaga 12/17/2023, 8:46 PM

## 2023-12-17 NOTE — Progress Notes (Signed)
   12/17/23 0801  Psych Admission Type (Psych Patients Only)  Admission Status Voluntary  Psychosocial Assessment  Patient Complaints Depression  Eye Contact Fair  Facial Expression Flat  Affect Depressed  Speech Soft;Slow  Interaction Guarded;Minimal  Motor Activity Slow  Appearance/Hygiene In scrubs  Behavior Characteristics Cooperative;Guarded  Mood Depressed  Thought Process  Coherency WDL  Content WDL  Delusions None reported or observed  Perception WDL  Hallucination None reported or observed  Judgment Poor  Confusion None  Danger to Self  Current suicidal ideation? Denies  Agreement Not to Harm Self Yes  Description of Agreement Verbal  Danger to Others  Danger to Others None reported or observed  Danger to Others Abnormal  Harmful Behavior to others No threats or harm toward other people  Destructive Behavior No threats or harm toward property

## 2023-12-17 NOTE — Progress Notes (Signed)
 [facility] MD Progress Note  12/17/2023 12:05 PM Aaron Rubio  MRN:  [id number] Subjective:   43 year old African-American male, single, unemployed, homeless.  Background history of schizophrenia paranoid type.  Recently discharged from old [facility].  I had not been adherent with treatment post discharge.  Presented with disorganized speech and thought process.  Was exhibiting thought blocking at presentation.  Expressed suicidal thoughts at presentation. Routine labs were essentially normal.  UDS was not done during this hospital stay.  Remote history of cocaine use.  I assumed care of this patient today.  Chart reviewed today.  Patient discussed at multidisciplinary team meeting.  Nursing staff reports that patient has been pleasant on the unit.  No challenging behavior.  He has been adherent with his medications.  No PRNs required.  Patient slept for 7 hours last night.  Seen today.  Patient tells me that he feels better now that he is back on his medications.  Olanzapine  has been helpful over the years.  He reports sedation with daytime olanzapine .  He is agreeable to switching this over to nighttime.  Patient is not endorsing any persecutory ideas.  He is not endorsing any other form of delusion.  Patient is not endorsing hallucination in any modality.  No current suicidal thoughts.  No rageful thoughts towards others or to property.  He dismissed any stressors at this time.  States that he does not have any family support.  We have agreed to use of olanzapine  30 mg at bedtime only.  I have encouraged patient to keep ventilating his feelings to staff.  Principal Problem: <principal problem not specified> Diagnosis: Active Problems:   Passive suicidal ideations   Paranoid schizophrenia (HCC)   Homelessness  Total Time spent with patient: 30 minutes  Past Psychiatric History:  Schizophrenia paranoid type and substance use disorder.  Good response to olanzapine  in the  past.  Past Medical History:  Past Medical History:  Diagnosis Date   Hypertension    Schizophrenia (HCC)    History reviewed. No pertinent surgical history. Family History: History reviewed. No pertinent family history. Family Psychiatric  History:  Social History:  Social History   Substance and Sexual Activity  Alcohol Use Yes     Social History   Substance and Sexual Activity  Drug Use Yes    Social History   Socioeconomic History   Marital status: Single    Spouse name: Not on file   Number of children: Not on file   Years of education: Not on file   Highest education level: Not on file  Occupational History   Not on file  Tobacco Use   Smoking status: Every Day    Current packs/day: 0.30    Types: Cigarettes   Smokeless tobacco: Not on file  Vaping Use   Vaping status: Never Used  Substance and Sexual Activity   Alcohol use: Yes   Drug use: Yes   Sexual activity: Not on file  Other Topics Concern   Not on file  Social History Narrative   Not on file   Social Drivers of Health   Financial Resource Strain: Not on file  Food Insecurity: Food Insecurity Present (12/15/2023)   Hunger Vital Sign    Worried About Running Out of Food in the Last Year: Often true    Ran Out of Food in the Last Year: Often true  Transportation Needs: Unmet Transportation Needs (12/15/2023)   PRAPARE - Administrator, Civil Service (Medical):  Yes    Lack of Transportation (Non-Medical): Yes  Physical Activity: Not on file  Stress: Not on file  Social Connections: Unknown (05/08/2022)   Received from [facility]   Social Network    Social Network: Not on file   Additional Social History:  Single, unemployed, minimal to no social support. He is chronically homeless.    Current Medications: Current Facility-Administered Medications  Medication Dose Route Frequency Provider Last Rate Last Admin   acetaminophen  (TYLENOL ) tablet 650 mg  650 mg Oral Q6H PRN  Hobson, Fran E, NP   650 mg at 12/16/23 1703   alum & mag hydroxide-simeth (MAALOX/MYLANTA) 200-200-20 MG/5ML suspension 30 mL  30 mL Oral Q4H PRN Hobson, Fran E, NP       amLODipine  (NORVASC ) tablet 10 mg  10 mg Oral Daily Hobson, Fran E, NP   10 mg at 12/17/23 0801   haloperidol  (HALDOL ) tablet 5 mg  5 mg Oral TID PRN Hobson, Fran E, NP       And   diphenhydrAMINE  (BENADRYL ) capsule 50 mg  50 mg Oral TID PRN Hobson, Fran E, NP       haloperidol  lactate (HALDOL ) injection 10 mg  10 mg Intramuscular TID PRN Hobson, Fran E, NP       And   diphenhydrAMINE  (BENADRYL ) injection 50 mg  50 mg Intramuscular TID PRN Hobson, Fran E, NP       And   LORazepam  (ATIVAN ) injection 2 mg  2 mg Intramuscular TID PRN Hobson, Fran E, NP       haloperidol  lactate (HALDOL ) injection 5 mg  5 mg Intramuscular TID PRN Hobson, Fran E, NP       And   diphenhydrAMINE  (BENADRYL ) injection 50 mg  50 mg Intramuscular TID PRN Hobson, Fran E, NP       And   LORazepam  (ATIVAN ) injection 2 mg  2 mg Intramuscular TID PRN Hobson, Fran E, NP       hydrOXYzine  (ATARAX ) tablet 25 mg  25 mg Oral TID PRN Hobson, Fran E, NP   25 mg at 12/16/23 2046   magnesium  hydroxide (MILK OF MAGNESIA) suspension 30 mL  30 mL Oral Daily PRN Hobson, Fran E, NP       OLANZapine  (ZYPREXA ) tablet 10 mg  10 mg Oral Daily Hobson, Fran E, NP   10 mg at 12/17/23 0801   OLANZapine  (ZYPREXA ) tablet 20 mg  20 mg Oral QHS Hobson, Fran E, NP   20 mg at 12/16/23 2046   traZODone  (DESYREL ) tablet 50 mg  50 mg Oral QHS PRN Hobson, Fran E, NP   50 mg at 12/16/23 2046    Lab Results: No results found for this or any previous visit (from the past 48 hours).  Blood Alcohol level:  Lab Results  Component Value Date   [facility] <15 12/14/2023   ETH <15 12/06/2023    Metabolic Disorder Labs: Lab Results  Component Value Date   HGBA1C 5.0 10/27/2023   MPG 96.8 10/27/2023   No results found for: PROLACTIN Lab Results  Component Value Date   CHOL 152  10/19/2023   TRIG 144 10/19/2023   HDL 57 10/19/2023   CHOLHDL 2.7 10/19/2023   VLDL 29 10/19/2023   LDLCALC 66 10/19/2023    Physical Findings: AIMS:  ,  ,  ,  ,  ,  ,   CIWA:    COWS:     Musculoskeletal: Strength & Muscle Tone: within normal limits Gait &  Station: normal Patient leans: N/A  Psychiatric Specialty Exam:  Presentation  General Appearance and behavior:  In hospital clothing, heavily built, eating during the interview, not in any distress, good rapport.  No EPS.  Eye Contact: Good.  Speech: Spontaneous.  Soft spoken.  Mood and Affect  Mood: Subjectively and objectively better.  Affect: Restricted and appropriate.  Thought Process  Thought Processes: Slightly decreased speed of thought.  Linear and goal directed.  Descriptions of Associations:Intact  Orientation:Full (Time, Place and Person)  Thought Content: No delusional theme.  Future oriented.  No current suicidal thoughts.  No homicidal thoughts.  No thoughts of violence.  No negative ruminative flooding.  No guilty ruminations.  No delusional theme.  No obsessions.  Hallucinations: No hallucination in any modality.  Sensorium  Memory: Good.  Judgment: Good.  Insight: Good  Executive Functions  Concentration: Good.  Attention Span: Good.  Recall: Good.  Fund of Knowledge: Good.  Language: Good   Psychomotor Activity  Marginally decreased psychomotor activity    Physical Exam: Physical Exam ROS Blood pressure (!) 124/92, pulse 74, temperature 98 F (36.7 C), temperature source Oral, resp. rate 18, height 6' 2 (1.88 m), weight 104.8 kg, SpO2 100%. Body mass index is 29.66 kg/m.   Treatment Plan Summary: Patient has a history of schizophrenia and substance use disorder.  He was recently discharged from [facility] but has not been adherent with medication.  UDS was not done during this admission.  He was restarted on olanzapine  and seems to be responding well.  No  current dangerousness.  We will adjust as below and evaluate him further.  1.  Olanzapine  30 mg at bedtime only. 2.  Continue to monitor mood behavior and interaction with others. 3.  Continue to encourage unit groups and therapeutic activities. 4.  Social worker will obtain collateral from his caregivers in the community. 5.  Social worker will coordinate discharge and aftercare planning.  Amelie Jury, MD 12/17/2023, 12:05 PM

## 2023-12-17 NOTE — Plan of Care (Signed)
   Problem: Education: Goal: Emotional status will improve Outcome: Progressing Goal: Mental status will improve Outcome: Progressing Goal: Verbalization of understanding the information provided will improve Outcome: Progressing

## 2023-12-17 NOTE — BH IP Treatment Plan (Signed)
 Interdisciplinary Treatment and Diagnostic Plan Update  12/17/2023 Time of Session: [address] Aaron Rubio MRN: [id number]  Principal Diagnosis: Paranoid schizophrenia [facility])  Secondary Diagnoses: Principal Problem:   Paranoid schizophrenia (HCC) Active Problems:   Passive suicidal ideations   Homelessness   Current Medications:  Current Facility-Administered Medications  Medication Dose Route Frequency Provider Last Rate Last Admin   acetaminophen  (TYLENOL ) tablet 650 mg  650 mg Oral Q6H PRN Hobson, Fran E, NP   650 mg at 12/16/23 1703   alum & mag hydroxide-simeth (MAALOX/MYLANTA) 200-200-20 MG/5ML suspension 30 mL  30 mL Oral Q4H PRN Hobson, Fran E, NP       amLODipine  (NORVASC ) tablet 10 mg  10 mg Oral Daily Hobson, Fran E, NP   10 mg at 12/17/23 0801   haloperidol  (HALDOL ) tablet 5 mg  5 mg Oral TID PRN Hobson, Fran E, NP       And   diphenhydrAMINE  (BENADRYL ) capsule 50 mg  50 mg Oral TID PRN Hobson, Fran E, NP       haloperidol  lactate (HALDOL ) injection 10 mg  10 mg Intramuscular TID PRN Hobson, Fran E, NP       And   diphenhydrAMINE  (BENADRYL ) injection 50 mg  50 mg Intramuscular TID PRN Hobson, Fran E, NP       And   LORazepam  (ATIVAN ) injection 2 mg  2 mg Intramuscular TID PRN Hobson, Fran E, NP       haloperidol  lactate (HALDOL ) injection 5 mg  5 mg Intramuscular TID PRN Hobson, Fran E, NP       And   diphenhydrAMINE  (BENADRYL ) injection 50 mg  50 mg Intramuscular TID PRN Hobson, Fran E, NP       And   LORazepam  (ATIVAN ) injection 2 mg  2 mg Intramuscular TID PRN Hobson, Fran E, NP       hydrOXYzine  (ATARAX ) tablet 25 mg  25 mg Oral TID PRN Hobson, Fran E, NP   25 mg at 12/16/23 2046   magnesium  hydroxide (MILK OF MAGNESIA) suspension 30 mL  30 mL Oral Daily PRN Hobson, Fran E, NP       OLANZapine  (ZYPREXA ) tablet 10 mg  10 mg Oral Daily Hobson, Fran E, NP   10 mg at 12/17/23 0801   OLANZapine  (ZYPREXA ) tablet 20 mg  20 mg Oral QHS Hobson, Fran E, NP   20 mg at  12/16/23 2046   traZODone  (DESYREL ) tablet 50 mg  50 mg Oral QHS PRN Hobson, Fran E, NP   50 mg at 12/16/23 2046   PTA Medications: No medications prior to admission.    Patient Stressors:    Patient Strengths:    Treatment Modalities: Medication Management, Group therapy, Case management,  1 to 1 session with clinician, Psychoeducation, Recreational therapy.   Physician Treatment Plan for Primary Diagnosis: Paranoid schizophrenia (HCC) Long Term Goal(s): Improvement in symptoms so as ready for discharge   Short Term Goals: Ability to identify changes in lifestyle to reduce recurrence of condition will improve Ability to disclose and discuss suicidal ideas Ability to identify and develop effective coping behaviors will improve Ability to identify triggers associated with substance abuse/mental health issues will improve  Medication Management: Evaluate patient's response, side effects, and tolerance of medication regimen.  Therapeutic Interventions: 1 to 1 sessions, Unit Group sessions and Medication administration.  Evaluation of Outcomes: Not Progressing  Physician Treatment Plan for Secondary Diagnosis: Principal Problem:   Paranoid schizophrenia (HCC) Active Problems:   Passive suicidal ideations  Homelessness  Long Term Goal(s): Improvement in symptoms so as ready for discharge   Short Term Goals: Ability to identify changes in lifestyle to reduce recurrence of condition will improve Ability to disclose and discuss suicidal ideas Ability to identify and develop effective coping behaviors will improve Ability to identify triggers associated with substance abuse/mental health issues will improve     Medication Management: Evaluate patient's response, side effects, and tolerance of medication regimen.  Therapeutic Interventions: 1 to 1 sessions, Unit Group sessions and Medication administration.  Evaluation of Outcomes: Not Progressing   RN Treatment Plan for  Primary Diagnosis: Paranoid schizophrenia (HCC) Long Term Goal(s): Knowledge of disease and therapeutic regimen to maintain health will improve  Short Term Goals: Ability to remain free from injury will improve, Ability to verbalize frustration and anger appropriately will improve, Ability to demonstrate self-control, Ability to participate in decision making will improve, Ability to verbalize feelings will improve, Ability to disclose and discuss suicidal ideas, Ability to identify and develop effective coping behaviors will improve, and Compliance with prescribed medications will improve  Medication Management: RN will administer medications as ordered by provider, will assess and evaluate patient's response and provide education to patient for prescribed medication. RN will report any adverse and/or side effects to prescribing provider.  Therapeutic Interventions: 1 on 1 counseling sessions, Psychoeducation, Medication administration, Evaluate responses to treatment, Monitor vital signs and CBGs as ordered, Perform/monitor CIWA, COWS, AIMS and Fall Risk screenings as ordered, Perform wound care treatments as ordered.  Evaluation of Outcomes: Not Progressing   LCSW Treatment Plan for Primary Diagnosis: Paranoid schizophrenia (HCC) Long Term Goal(s): Safe transition to appropriate next level of care at discharge, Engage patient in therapeutic group addressing interpersonal concerns.  Short Term Goals: Engage patient in aftercare planning with referrals and resources, Increase social support, Increase ability to appropriately verbalize feelings, Increase emotional regulation, Facilitate acceptance of mental health diagnosis and concerns, Facilitate patient progression through stages of change regarding substance use diagnoses and concerns, Identify triggers associated with mental health/substance abuse issues, and Increase skills for wellness and recovery  Therapeutic Interventions: Assess for all  discharge needs, 1 to 1 time with Social worker, Explore available resources and support systems, Assess for adequacy in community support network, Educate family and significant other(s) on suicide prevention, Complete Psychosocial Assessment, Interpersonal group therapy.  Evaluation of Outcomes: Not Progressing   Progress in Treatment: Attending groups: No. Participating in groups: No. and As evidenced by:  recently admitted >24hrs ago Taking medication as prescribed: Yes. Toleration medication: Yes. Family/Significant other contact made: No, will contact:  declined Patient understands diagnosis: Yes. Discussing patient identified problems/goals with staff: Yes. Medical problems stabilized or resolved: Yes. Denies suicidal/homicidal ideation: Yes. Issues/concerns per patient self-inventory: No. Other: n/a  New problem(s) identified: No, Describe:  None  New Short Term/Long Term Goal(s): medication stabilization, elimination of SI thoughts, development of comprehensive mental wellness plan.   Patient Goals: Be able to get better medicine   Discharge Plan or Barriers: Patient recently admitted. CSW will continue to follow and assess for appropriate referrals and possible discharge planning.    Reason for Continuation of Hospitalization: Anxiety Medication stabilization Other; describe Mood stabilization, discharge planning  Estimated Length of Stay: 3-5 DAYS  Last 3 [place] Suicide Severity Risk Score: Flowsheet Row [facility] Most recent reading at 12/15/2023  4:00 PM ED from 12/14/2023 in [facility] Most recent reading at 12/15/2023 12:18 AM  ED from 12/14/2023 in [facility] Most recent reading at 12/14/2023  1:32 PM  C-SSRS RISK CATEGORY Low Risk Low Risk No Risk    Last PHQ 2/9 Scores:     No data to display           Scribe for Treatment Team: Johsua Shevlin N Elbridge Magowan, LCSW 12/17/2023 2:32 PM

## 2023-12-17 NOTE — Progress Notes (Signed)
 Recreation Therapy Notes  Patient admitted to unit 6.14.25. Due to admission within last year, no new recreation therapy assessment conducted at this time. Last assessment conducted on 5.5.25.    Reason for current admission per patient, depression.  Patient reports stressors are going to places I'm told to rely on and getting turned away.   Patient coping skills remain the same.   Patient leisure interest remain the same.  Patient strengths and areas of improvement remain the same.  Patient reports goal of better resources.  Patient denies SI, HI, AVH at this time.    Information found below from assessment conducted 5.5.25.  Coping Skills: TV, Exercise, Music, Art, Talk, Prayer, Substance Abuse, Avoidance, Read  Leisure Interests: Lonne Roan  Strengths: better outlook within myself  Areas of Improvement: to improve    Cameran Pettey-McCall, LRT,CTRS Avey Mcmanamon A Zemira Zehring-McCall 12/17/2023 2:08 PM

## 2023-12-17 NOTE — [facility] Group Notes (Signed)
 Adult Psychoeducational Group Note  Date:  12/17/2023 Time:  5:18 PM  Group Topic/Focus:  Goals Group:   The focus of this group is to help patients establish daily goals to achieve during treatment and discuss how the patient can incorporate goal setting into their daily lives to aide in recovery. Orientation:   The focus of this group is to educate the patient on the purpose and policies of crisis stabilization and provide a format to answer questions about their admission.  The group details unit policies and expectations of patients while admitted.  Participation Level:  Did Not Attend  Participation Quality:    Affect:    Cognitive:    Insight:   Engagement in Group:    Modes of Intervention:    Additional Comments:    Aaron Rubio O 12/17/2023, 5:18 PM

## 2023-12-17 NOTE — Group Note (Signed)
 LCSW Group Therapy Note   Group Date: 12/17/2023 Start Time: 1300 End Time: 1400   Participation:  did not attend  Type of Therapy:  Group Therapy  Topic:  Understanding Your Path to Change  Objective:  The goal is to help individuals understand the stages of change, identify where they currently are in the process, and provide actionable next steps to continue moving forward in their journey of change.  Goals: - Learn about the six stages of change:  Precontemplation, Contemplation, Preparation, Action, Maintenance, and Relapse - Reflect on Current Change Efforts:  Recognize which  stage participants are in regarding a personal change. - Plan Next Steps for Moving Forward:  Create an action plan based on their current stage of change.  Class Summary:  In this session, we explored the Stages of Change as a framework to understand the process of change.  We discussed how each stage helps individuals recognize where they are in their personal journey and used the Stages of Change Worksheet for self-reflection. Participants answered questions to better understand their current stage, challenges, and progress. We also emphasized the importance of moving forward, even if setbacks (Relapse) occur, and created actionable steps to help participants continue progressing. By the end of the session, participants gained a clearer understanding of their path to change and left with a clear plan for next steps.  Therapeutic Modalities:  Elements of CBT (cognitive restructuring, problem solving)  Element of DBT (mindfulness, distress tolerance)   Perl Kerney O Ravon Mcilhenny, LCSWA 12/17/2023  5:42 PM

## 2023-12-18 DIAGNOSIS — F200 Paranoid schizophrenia: Secondary | ICD-10-CM | POA: Diagnosis not present

## 2023-12-18 MED ORDER — OLANZAPINE 10 MG PO TABS
30.0000 mg | ORAL_TABLET | Freq: Every day | ORAL | Status: DC
  Administered 2023-12-19: 30 mg via ORAL
  Filled 2023-12-18: qty 3

## 2023-12-18 NOTE — Progress Notes (Signed)
 [facility] MD Progress Note  12/18/2023 3:41 PM Aaron Rubio  MRN:  [id number] Subjective:   43 year old African-American male, single, unemployed, homeless.  Background history of schizophrenia paranoid type.  Recently discharged from old [facility]  I had not been adherent with treatment post discharge.  Presented with disorganized speech and thought process.  Was exhibiting thought blocking at presentation.  Expressed suicidal thoughts at presentation. Routine labs were essentially normal.  UDS was not done during this hospital stay.  Remote history of cocaine use.  Chart reviewed today.  Patient discussed at multidisciplinary team meeting.  Nursing staff reports that patient slept well last night.  He has been adherent with his medication.  No challenging behavior.  No observed response to internal stimuli.  He has not required any as needed medication.  Seen today.  Patient states that he feels good.  States that he is processing how to deal with an issue that he has on the streets.  He was guarded about this issue but dismissed any violent thoughts towards himself or towards anybody else.  Patient is not endorsing any form of hallucination.  He is not endorsing any persecutory ideas.  No other form of delusion.  No external interference with his thought process or his will.  He is tolerating his medicines without any adverse effects.  States that he will be going to a shelter when discharged. Encouraged to keep ventilating his feelings to staff.  Principal Problem: Paranoid schizophrenia (HCC) Diagnosis: Principal Problem:   Paranoid schizophrenia (HCC) Active Problems:   Passive suicidal ideations   Homelessness  Total Time spent with patient: 30 minutes  Past Psychiatric History:  Schizophrenia paranoid type and substance use disorder.  Good response to olanzapine  in the past.  Past Medical History:  Past Medical History:  Diagnosis Date   Hypertension     Schizophrenia (HCC)    History reviewed. No pertinent surgical history. Family History: History reviewed. No pertinent family history. Family Psychiatric  History:  Social History:  Social History   Substance and Sexual Activity  Alcohol Use Yes     Social History   Substance and Sexual Activity  Drug Use Yes    Social History   Socioeconomic History   Marital status: Single    Spouse name: Not on file   Number of children: Not on file   Years of education: Not on file   Highest education level: Not on file  Occupational History   Not on file  Tobacco Use   Smoking status: Every Day    Current packs/day: 0.30    Types: Cigarettes   Smokeless tobacco: Not on file  Vaping Use   Vaping status: Never Used  Substance and Sexual Activity   Alcohol use: Yes   Drug use: Yes   Sexual activity: Not on file  Other Topics Concern   Not on file  Social History Narrative   Not on file   Social Drivers of Health   Financial Resource Strain: Not on file  Food Insecurity: Food Insecurity Present (12/15/2023)   Hunger Vital Sign    Worried About Running Out of Food in the Last Year: Often true    Ran Out of Food in the Last Year: Often true  Transportation Needs: Unmet Transportation Needs (12/15/2023)   PRAPARE - Administrator, Civil Service (Medical): Yes    Lack of Transportation (Non-Medical): Yes  Physical Activity: Not on file  Stress: Not on file  Social Connections:  Unknown (05/08/2022)   Received from [facility]   Social Network    Social Network: Not on file   Additional Social History:  Single, unemployed, minimal to no social support. He is chronically homeless.    Current Medications: Current Facility-Administered Medications  Medication Dose Route Frequency Provider Last Rate Last Admin   acetaminophen  (TYLENOL ) tablet 650 mg  650 mg Oral Q6H PRN Hobson, Fran E, NP   650 mg at 12/16/23 1703   alum & mag hydroxide-simeth (MAALOX/MYLANTA)  200-200-20 MG/5ML suspension 30 mL  30 mL Oral Q4H PRN Hobson, Fran E, NP       amLODipine  (NORVASC ) tablet 10 mg  10 mg Oral Daily Hobson, Fran E, NP   10 mg at 12/18/23 0848   haloperidol  (HALDOL ) tablet 5 mg  5 mg Oral TID PRN Hobson, Fran E, NP       And   diphenhydrAMINE  (BENADRYL ) capsule 50 mg  50 mg Oral TID PRN Hobson, Fran E, NP       haloperidol  lactate (HALDOL ) injection 10 mg  10 mg Intramuscular TID PRN Hobson, Fran E, NP       And   diphenhydrAMINE  (BENADRYL ) injection 50 mg  50 mg Intramuscular TID PRN Hobson, Fran E, NP       And   LORazepam  (ATIVAN ) injection 2 mg  2 mg Intramuscular TID PRN Hobson, Fran E, NP       haloperidol  lactate (HALDOL ) injection 5 mg  5 mg Intramuscular TID PRN Hobson, Fran E, NP       And   diphenhydrAMINE  (BENADRYL ) injection 50 mg  50 mg Intramuscular TID PRN Hobson, Fran E, NP       And   LORazepam  (ATIVAN ) injection 2 mg  2 mg Intramuscular TID PRN Hobson, Fran E, NP       hydrOXYzine  (ATARAX ) tablet 25 mg  25 mg Oral TID PRN Hobson, Fran E, NP   25 mg at 12/17/23 2039   magnesium  hydroxide (MILK OF MAGNESIA) suspension 30 mL  30 mL Oral Daily PRN Hobson, Fran E, NP       OLANZapine  (ZYPREXA ) tablet 20 mg  20 mg Oral QHS Lowry Bala A, MD   20 mg at 12/17/23 2039   [START ON 12/19/2023] OLANZapine  (ZYPREXA ) tablet 30 mg  30 mg Oral QHS Antoney Biven, Iline Mallory, MD       traZODone  (DESYREL ) tablet 50 mg  50 mg Oral QHS PRN Hobson, Fran E, NP   50 mg at 12/17/23 2038    Lab Results: No results found for this or any previous visit (from the past 48 hours).  Blood Alcohol level:  Lab Results  Component Value Date   [facility] <15 12/14/2023   ETH <15 12/06/2023    Metabolic Disorder Labs: Lab Results  Component Value Date   HGBA1C 5.0 10/27/2023   MPG 96.8 10/27/2023   No results found for: PROLACTIN Lab Results  Component Value Date   CHOL 152 10/19/2023   TRIG 144 10/19/2023   HDL 57 10/19/2023   CHOLHDL 2.7 10/19/2023   VLDL 29  10/19/2023   LDLCALC 66 10/19/2023    Physical Findings: AIMS:  ,  ,  ,  ,  ,  ,   CIWA:    COWS:     Musculoskeletal: Strength & Muscle Tone: within normal limits Gait & Station: normal Patient leans: N/A  Psychiatric Specialty Exam:  Presentation  General Appearance and behavior:  Casually dressed, in bed, not in  any distress, good rapport.  No EPS.  Eye Contact: Good.  Speech: Spontaneous.  Normalizing rate, tone and volume.  Mood and Affect  Mood: Subjectively and objectively better.  Affect: Restricted and appropriate.  Thought Process  Thought Processes: Slightly decreased speed of thought.  Linear and goal directed.  Descriptions of Associations:Intact  Orientation:Full (Time, Place and Person)  Thought Content: No delusional theme.  Future oriented.  No current suicidal thoughts.  No homicidal thoughts.  No thoughts of violence.  No negative ruminative flooding.  No guilty ruminations.  No delusional theme.  No obsessions.  Hallucinations: No hallucination in any modality.  Sensorium  Memory: Good.  Judgment: Good.  Insight: Good  Executive Functions  Concentration: Good.  Attention Span: Good.  Recall: Good.  Fund of Knowledge: Good.  Language: Good   Psychomotor Activity  Normalizing psychomotor activity    Physical Exam: Physical Exam ROS Blood pressure 133/89, pulse 64, temperature 98.1 F (36.7 C), temperature source Oral, resp. rate 18, height 6' 2 (1.88 m), weight 104.8 kg, SpO2 100%. Body mass index is 29.66 kg/m.   Treatment Plan Summary: Patient is doing well on his regimen.  He is gradually responding to treatment.  He is in touch with reality and states processing issues that he has in the streets.  He is thinking of how to deal with them in a nonviolent manner.  No current dangerousness.  We will keep his medicine the same and evaluate him further.  Hopeful discharge by the end of the week if he maintains  progress.  1.  Olanzapine  30 mg at bedtime.   2.  Continue to monitor mood behavior and interaction with others. 3.  Continue to encourage unit groups and therapeutic activities. 4.  Social worker will obtain collateral from his caregivers in the community. 5.  Social worker will coordinate discharge and aftercare planning.  Amelie Jury, MD 12/18/2023, 3:41 PM

## 2023-12-18 NOTE — Plan of Care (Signed)
  Problem: Education: Goal: Emotional status will improve Outcome: Progressing Goal: Mental status will improve Outcome: Progressing Goal: Verbalization of understanding the information provided will improve Outcome: Progressing   Problem: Safety: Goal: Periods of time without injury will increase Outcome: Progressing   

## 2023-12-18 NOTE — [facility] Group Notes (Signed)
 Adult Psychoeducational Group Note  Date:  12/18/2023 Time:  10:17 AM  Group Topic/Focus:  Goals Group:   The focus of this group is to help patients establish daily goals to achieve during treatment and discuss how the patient can incorporate goal setting into their daily lives to aide in recovery. Orientation:   The focus of this group is to educate the patient on the purpose and policies of crisis stabilization and provide a format to answer questions about their admission.  The group details unit policies and expectations of patients while admitted.  Participation Level:  Did Not Attend  Participation Quality:    Affect:    Cognitive:    Insight:   Engagement in Group:    Modes of Intervention:    Additional Comments:    Aaron Rubio 12/18/2023, 10:17 AM

## 2023-12-18 NOTE — Group Note (Signed)
 Date:  12/18/2023 Time:  9:04 PM  Group Topic/Focus:  Wrap-Up Group:   The focus of this group is to help patients review their daily goal of treatment and discuss progress on daily workbooks.    Participation Level:  Did Not Attend  Participation Quality:  n/a  Affect:  n/a  Cognitive:  n/a  Insight: None  Engagement in Group:  None  Modes of Intervention:  n/a  Additional Comments:   Pt was encouraged but refused to attend wrap up group  Tarrence Enck A Starling Christofferson 12/18/2023, 9:04 PM

## 2023-12-18 NOTE — Group Note (Signed)
 Recreation Therapy Group Note   Group Topic:Self-Esteem  Group Date: 12/18/2023 Start Time: 1106 End Time: 1145 Facilitators: Lakeidra Reliford-McCall, LRT,CTRS Location: [address] Dayroom   Group Topic: Self-esteem  Goal Area(s) Addresses:  Patient will identify and write at least one positive trait about themself. Patient will acknowledge the benefit of healthy self-esteem. Patient will endorse understanding of ways to increase self-esteem.   Behavioral Response:    Intervention: Personalized Plate- printed license plate template, markers or colored pencils   Activity: LRT began group session with open dialogue asking the patients to define self-esteem and verbally identify positive qualities and traits people may possess. Patients were then instructed to design a personalized license plate, with words and drawings, representing at least 3 positive things about themselves. Pts were encouraged to include favorites, things they are proud of, what they enjoy doing, and dreams for their future. If a patient had a life motto or a meaningful phase that expressed their life values, pt's were asked to incorporate that into their design as well. Patients were given the opportunity to share their completed work with the group.   Education: LRT educated patients on the importance of healthy self-esteem and ways to build self-esteem. LRT addressed discharge planning reviewing positive coping skills and healthy support systems.  Education Outcome: Acknowledges education/In group clarification offered   Affect/Mood: N/A   Participation Level: Did not attend    Clinical Observations/Individualized Feedback:    Plan: Continue to engage patient in RT group sessions 2-3x/week.   Jeniel Slauson-McCall, LRT,CTRS 12/18/2023 2:11 PM

## 2023-12-18 NOTE — Plan of Care (Signed)
   Problem: Education: Goal: Emotional status will improve Outcome: Not Progressing Goal: Mental status will improve Outcome: Not Progressing

## 2023-12-18 NOTE — Group Note (Unsigned)
 Date:  12/18/2023 Time:  8:25 PM  Group Topic/Focus:  Wrap-Up Group:   The focus of this group is to help patients review their daily goal of treatment and discuss progress on daily workbooks.     Participation Level:  {BHH PARTICIPATION ZOXWR:60454}  Participation Quality:  {BHH PARTICIPATION QUALITY:22265}  Affect:  {BHH AFFECT:22266}  Cognitive:  {BHH COGNITIVE:22267}  Insight: {BHH Insight2:20797}  Engagement in Group:  {BHH ENGAGEMENT IN UJWJX:91478}  Modes of Intervention:  {BHH MODES OF INTERVENTION:22269}  Additional Comments:  ***  [name] 12/18/2023, 8:25 PM

## 2023-12-18 NOTE — Progress Notes (Signed)
   12/17/23 2215  Psych Admission Type (Psych Patients Only)  Admission Status Voluntary  Psychosocial Assessment  Patient Complaints Depression  Eye Contact Fair  Facial Expression Flat  Affect Depressed  Speech Soft  Interaction Guarded;Minimal  Motor Activity Slow  Appearance/Hygiene In scrubs  Behavior Characteristics Cooperative  Mood Depressed  Thought Process  Coherency WDL  Content WDL  Delusions None reported or observed  Perception WDL  Hallucination None reported or observed  Judgment Poor  Confusion None  Danger to Self  Current suicidal ideation? Denies  Agreement Not to Harm Self Yes  Description of Agreement Verbal  Danger to Others  Danger to Others None reported or observed  Danger to Others Abnormal  Harmful Behavior to others No threats or harm toward other people  Destructive Behavior No threats or harm toward property

## 2023-12-18 NOTE — Progress Notes (Signed)
   12/18/23 0900  Psych Admission Type (Psych Patients Only)  Admission Status Voluntary  Psychosocial Assessment  Patient Complaints Depression  Eye Contact Fair  Facial Expression Flat  Affect Depressed  Speech Soft  Interaction Minimal  Motor Activity Slow  Appearance/Hygiene In scrubs  Behavior Characteristics Appropriate to situation  Mood Depressed  Thought Process  Coherency WDL  Content WDL  Delusions None reported or observed  Perception WDL  Hallucination None reported or observed  Judgment Poor  Confusion None  Danger to Self  Current suicidal ideation? Denies  Danger to Others  Danger to Others None reported or observed

## 2023-12-19 DIAGNOSIS — F200 Paranoid schizophrenia: Secondary | ICD-10-CM | POA: Diagnosis not present

## 2023-12-19 NOTE — Plan of Care (Signed)
   Problem: Education: Goal: Emotional status will improve Outcome: Progressing Goal: Mental status will improve Outcome: Progressing   Problem: Activity: Goal: Sleeping patterns will improve Outcome: Progressing

## 2023-12-19 NOTE — Plan of Care (Signed)
   Problem: Education: Goal: Emotional status will improve Outcome: Progressing Goal: Mental status will improve Outcome: Progressing Goal: Verbalization of understanding the information provided will improve Outcome: Progressing   Problem: Activity: Goal: Interest or engagement in activities will improve Outcome: Progressing

## 2023-12-19 NOTE — Progress Notes (Signed)
 Conversation with patient:  Patient said that CSW can use his godfather's phone number when scheduling appointments:  Siegfried Dress Chief Technology Officer) [phone]   Carmelite Violet, LCSWA 12/19/2023

## 2023-12-19 NOTE — Progress Notes (Signed)
 [facility] MD Progress Note  12/19/2023 3:37 PM Aaron Rubio  MRN:  [id number] Subjective:   43 year old African-American male, single, unemployed, homeless.  Background history of schizophrenia paranoid type.  Recently discharged from old [facility].  I had not been adherent with treatment post discharge.  Presented with disorganized speech and thought process.  Was exhibiting thought blocking at presentation.  Expressed suicidal thoughts at presentation. Routine labs were essentially normal.  UDS was not done during this hospital stay.  Remote history of cocaine use.  Chart reviewed today.  Patient discussed at multidisciplinary team meeting.  Nursing staff reports that patient slept for the 12.5 hours.  He takes his medication and goes back to his room.  He is not participating with unit groups or therapeutic activities.  No observed response to internal stimuli.  Utilization review indicates that he has had multiple short admissions in the past couple of months.  Admissions are usually in context of substance use and he goes back to the same cycle again.  Social worker reports that he does not have any desire to seek addiction treatment.  He wants to return back to the shelter.  No motivation for IOP.  Seen today.  Patient tells me that he feels all right with his medication.  States that his mind is working well.  He feels evenly keeled.  He is not experiencing any form of hallucination.  Patient is not endorsing any persecutory ideas.  He is not endorsing any other form of delusion.  No external interference with his thought process is well.  States that he has had time to process things going on in the streets.  He feels comfortable going back to the shelter soon.  He is not endorsing any violent thoughts towards himself or towards others.  No adverse effects from his medications.    Principal Problem: Paranoid schizophrenia (HCC) Diagnosis: Principal Problem:   Paranoid  schizophrenia (HCC) Active Problems:   Passive suicidal ideations   Homelessness  Total Time spent with patient: 30 minutes  Past Psychiatric History:  Schizophrenia paranoid type and substance use disorder.  Good response to olanzapine  in the past.  Past Medical History:  Past Medical History:  Diagnosis Date   Hypertension    Schizophrenia (HCC)    History reviewed. No pertinent surgical history. Family History: History reviewed. No pertinent family history. Family Psychiatric  History:  Social History:  Social History   Substance and Sexual Activity  Alcohol Use Yes     Social History   Substance and Sexual Activity  Drug Use Yes    Social History   Socioeconomic History   Marital status: Single    Spouse name: Not on file   Number of children: Not on file   Years of education: Not on file   Highest education level: Not on file  Occupational History   Not on file  Tobacco Use   Smoking status: Every Day    Current packs/day: 0.30    Types: Cigarettes   Smokeless tobacco: Not on file  Vaping Use   Vaping status: Never Used  Substance and Sexual Activity   Alcohol use: Yes   Drug use: Yes   Sexual activity: Not on file  Other Topics Concern   Not on file  Social History Narrative   Not on file   Social Drivers of Health   Financial Resource Strain: Not on file  Food Insecurity: Food Insecurity Present (12/15/2023)   Hunger Vital Sign  Worried About Programme researcher, broadcasting/film/video in the Last Year: Often true    [employer] of Food in the Last Year: Often true  Transportation Needs: Unmet Transportation Needs (12/15/2023)   PRAPARE - Administrator, Civil Service (Medical): Yes    Lack of Transportation (Non-Medical): Yes  Physical Activity: Not on file  Stress: Not on file  Social Connections: Unknown (05/08/2022)   Received from [facility]   Social Network    Social Network: Not on file   Additional Social History:  Single, unemployed, minimal  to no social support. He is chronically homeless.    Current Medications: Current Facility-Administered Medications  Medication Dose Route Frequency Provider Last Rate Last Admin   acetaminophen  (TYLENOL ) tablet 650 mg  650 mg Oral Q6H PRN Hobson, Fran E, NP   650 mg at 12/18/23 1724   alum & mag hydroxide-simeth (MAALOX/MYLANTA) 200-200-20 MG/5ML suspension 30 mL  30 mL Oral Q4H PRN Hobson, Fran E, NP       amLODipine  (NORVASC ) tablet 10 mg  10 mg Oral Daily Hobson, Fran E, NP   10 mg at 12/19/23 1610   haloperidol  (HALDOL ) tablet 5 mg  5 mg Oral TID PRN Hobson, Fran E, NP       And   diphenhydrAMINE  (BENADRYL ) capsule 50 mg  50 mg Oral TID PRN Hobson, Fran E, NP       haloperidol  lactate (HALDOL ) injection 10 mg  10 mg Intramuscular TID PRN Hobson, Fran E, NP       And   diphenhydrAMINE  (BENADRYL ) injection 50 mg  50 mg Intramuscular TID PRN Hobson, Fran E, NP       And   LORazepam  (ATIVAN ) injection 2 mg  2 mg Intramuscular TID PRN Hobson, Fran E, NP       haloperidol  lactate (HALDOL ) injection 5 mg  5 mg Intramuscular TID PRN Hobson, Fran E, NP       And   diphenhydrAMINE  (BENADRYL ) injection 50 mg  50 mg Intramuscular TID PRN Hobson, Fran E, NP       And   LORazepam  (ATIVAN ) injection 2 mg  2 mg Intramuscular TID PRN Hobson, Fran E, NP       hydrOXYzine  (ATARAX ) tablet 25 mg  25 mg Oral TID PRN Hobson, Fran E, NP   25 mg at 12/18/23 2058   magnesium  hydroxide (MILK OF MAGNESIA) suspension 30 mL  30 mL Oral Daily PRN Hobson, Fran E, NP       OLANZapine  (ZYPREXA ) tablet 20 mg  20 mg Oral QHS Froylan Hobby, Iline Mallory, MD   20 mg at 12/18/23 2058   OLANZapine  (ZYPREXA ) tablet 30 mg  30 mg Oral QHS Leani Myron, Iline Mallory, MD       traZODone  (DESYREL ) tablet 50 mg  50 mg Oral QHS PRN Hobson, Fran E, NP   50 mg at 12/18/23 2058    Lab Results: No results found for this or any previous visit (from the past 48 hours).  Blood Alcohol level:  Lab Results  Component Value Date   [facility] <15 12/14/2023    ETH <15 12/06/2023    Metabolic Disorder Labs: Lab Results  Component Value Date   HGBA1C 5.0 10/27/2023   MPG 96.8 10/27/2023   No results found for: PROLACTIN Lab Results  Component Value Date   CHOL 152 10/19/2023   TRIG 144 10/19/2023   HDL 57 10/19/2023   CHOLHDL 2.7 10/19/2023   VLDL 29 10/19/2023   LDLCALC  66 10/19/2023    Physical Findings: AIMS:  ,  ,  ,  ,  ,  ,   CIWA:    COWS:     Musculoskeletal: Strength & Muscle Tone: within normal limits Gait & Station: normal Patient leans: N/A  Psychiatric Specialty Exam:  Presentation  General Appearance and behavior:  Casually dressed, in bed, not in any distress, good rapport.  No EPS.  Eye Contact: Good.  Speech: Spontaneous.  Normal rate, tone and volume.  Normal prosody of speech.  Mood and Affect  Mood: Euthymic. Affect: Restricted and appropriate.  Thought Process  Thought Processes: Slightly decreased speed of thought.  Linear and goal directed.  Descriptions of Associations:Intact  Orientation:Full (Time, Place and Person)  Thought Content: No delusional theme.  Future oriented.  No current suicidal thoughts.  No homicidal thoughts.  No thoughts of violence.  No negative ruminative flooding.  No guilty ruminations.  No delusional theme.  No obsessions.  Hallucinations: No hallucination in any modality.  Sensorium  Memory: Good.  Judgment: Good.  Insight: Limited as he does not want to deal with his addiction.  He plans to stay on his medication.  Executive Functions  Concentration: Good.  Attention Span: Good.  Recall: Good.  Fund of Knowledge: Good.  Language: Good   Psychomotor Activity  Normal psychomotor activity    Physical Exam: Physical Exam ROS Blood pressure (!) 134/95, pulse 76, temperature 98.2 F (36.8 C), temperature source Oral, resp. rate 18, height 6' 2 (1.88 m), weight 104.8 kg, SpO2 100%. Body mass index is 29.66 kg/m.   Treatment  Plan Summary: Patient has completely come off any psychoactive substance that was in his system when he presented.  He is tolerating his psychiatric medication well.  No evidence of psychosis.  No evidence of mania.  No evidence of depression.  No overwhelming anxiety.  He is not a danger to himself or to others.  We are finalizing aftercare.  Hopeful discharge tomorrow if he maintains progress.  1.  Olanzapine  30 mg at bedtime.   2.  Continue to monitor mood behavior and interaction with others. 3.  Continue to encourage unit groups and therapeutic activities. 4.  Social worker will obtain collateral from his caregivers in the community. 5.  Social worker will coordinate discharge and aftercare planning.  Amelie Jury, MD 12/19/2023, 3:37 PM

## 2023-12-19 NOTE — Progress Notes (Signed)
   12/19/23 1945  Psych Admission Type (Psych Patients Only)  Admission Status Voluntary  Psychosocial Assessment  Patient Complaints Depression  Eye Contact Fair  Facial Expression Flat  Affect Depressed  Speech Soft  Interaction Minimal;Forwards little  Motor Activity Slow  Appearance/Hygiene Disheveled  Behavior Characteristics Cooperative  Mood Depressed  Aggressive Behavior  Effect No apparent injury  Thought Process  Coherency WDL  Content WDL  Delusions None reported or observed  Perception WDL  Hallucination None reported or observed  Judgment Poor  Confusion None  Danger to Self  Current suicidal ideation? Denies  Danger to Others  Danger to Others None reported or observed

## 2023-12-19 NOTE — Progress Notes (Signed)
   12/18/23 2158  Psych Admission Type (Psych Patients Only)  Admission Status Voluntary  Psychosocial Assessment  Patient Complaints Depression  Eye Contact Fair  Facial Expression Flat  Affect Depressed  Speech Soft  Interaction Minimal  Motor Activity Slow  Appearance/Hygiene In scrubs  Behavior Characteristics Cooperative;Calm  Mood Depressed  Thought Process  Coherency WDL  Content WDL  Delusions None reported or observed  Perception WDL  Hallucination None reported or observed  Judgment Poor  Confusion None  Danger to Self  Current suicidal ideation? Denies  Agreement Not to Harm Self Yes  Description of Agreement Verbal  Danger to Others  Danger to Others None reported or observed

## 2023-12-19 NOTE — Group Note (Signed)
 Recreation Therapy Group Note   Group Topic:Leisure Education  Group Date: 12/19/2023 Start Time: 0955 End Time: 1020 Facilitators: Rick Warnick-McCall, LRT,CTRS Location: [address] Dayroom   Group Topic: Leisure Education  Goal Area(s) Addresses:  Patient will define what leisure is. Patient will identify at least one positive benefit of participation in leisure activities.  Patient will work effectively work with peers to complete the activity.  Behavioral Response:    Intervention: Team Work, Communication  Activity: Patients were seated in a circle and given a beach ball. Patients were to hit the beach ball to each other as if playing volleyball. LRT would time them to see how long they could keep hte ball in motion. Patients were to keep the ball moving without it coming to a complete stop. The ball could bounce or roll on the floor but had to keep moving. If the ball came to a stop, LRT would start the time over.  Education:  Leisure Scientist, physiological, Special educational needs teacher, Teamwork, Discharge Planning  Education Outcome: Acknowledges education/In group clarification offered/Needs additional education.    Affect/Mood: N/A   Participation Level: Did not attend    Clinical Observations/Individualized Feedback:     Plan: Continue to engage patient in RT group sessions 2-3x/week.   Oneill Bais A Enrigue Hashimi-McCall, NT,  12/19/2023 12:24 PM

## 2023-12-19 NOTE — Plan of Care (Signed)
  Problem: Education: Goal: Knowledge of  General Education information/materials will improve Outcome: Progressing Goal: Emotional status will improve Outcome: Progressing   Problem: Safety: Goal: Periods of time without injury will increase Outcome: Progressing   Problem: Activity: Goal: Interest or engagement in activities will improve Outcome: Not Progressing:  Pt isolative to room and did not attend group

## 2023-12-19 NOTE — BHH Group Notes (Signed)
 Adult Psychoeducational Group Note  Date:  12/19/2023 Time:  8:52 PM  Group Topic/Focus:  Wrap-Up Group:   The focus of this group is to help patients review their daily goal of treatment and discuss progress on daily workbooks.  Participation Level:  Did Not Attend  Dal Dubin 12/19/2023, 8:52 PM

## 2023-12-19 NOTE — Progress Notes (Signed)
 Pt was initially resting in bed appearing to sleep at shift onset.  Pt awoke at 2nd prompting and was calm and cooperative.  Pt denied SI/HI/AVH and any pain.  Regarding AH he stated, not right now.   12/19/23 1000  Psych Admission Type (Psych Patients Only)  Admission Status Voluntary  Psychosocial Assessment  Patient Complaints Depression  Eye Contact Fair  Facial Expression Flat  Affect Depressed  Speech Soft  Interaction Minimal;Forwards little;Guarded  Motor Activity Slow  Appearance/Hygiene In scrubs  Behavior Characteristics Cooperative;Calm  Mood Depressed  Thought Process  Coherency WDL  Content WDL  Delusions None reported or observed  Perception WDL  Hallucination None reported or observed  Judgment Poor  Confusion None  Danger to Self  Current suicidal ideation? Denies  Self-Injurious Behavior No self-injurious ideation or behavior indicators observed or expressed   Agreement Not to Harm Self Yes  Description of Agreement Verbal  Danger to Others  Danger to Others None reported or observed  Danger to Others Abnormal  Harmful Behavior to others No threats or harm toward other people  Destructive Behavior No threats or harm toward property

## 2023-12-20 DIAGNOSIS — F200 Paranoid schizophrenia: Secondary | ICD-10-CM

## 2023-12-20 MED ORDER — OLANZAPINE 15 MG PO TABS: 30.0000 mg | ORAL_TABLET | Freq: Every day | ORAL | 0 refills | Status: DC

## 2023-12-20 MED ORDER — AMLODIPINE BESYLATE 10 MG PO TABS: 10.0000 mg | ORAL_TABLET | Freq: Every day | ORAL | 0 refills | Status: DC

## 2023-12-20 NOTE — BHH Suicide Risk Assessment (Signed)
 [facility] Discharge Suicide Risk Assessment   Principal Problem: Paranoid schizophrenia [facility]) Discharge Diagnoses: Principal Problem:   Paranoid schizophrenia (HCC) Active Problems:   Passive suicidal ideations   Homelessness   Total Time spent with patient: 20 minutes  Musculoskeletal: Strength & Muscle Tone: within normal limits Gait & Station: normal Patient leans: N/A  Psychiatric Specialty Exam  Presentation  General Appearance and behavior:  Casually dressed, not in any distress, appropriate behavior, good rapport.  No EPS.  Eye Contact: Good.  Speech: Spontaneous.  Normal rate, tone and volume.  Normal prosody of speech.  Mood and Affect  Mood: Euthymic.  Affect: Full range and appropriate.  Thought Process  Thought Processes: Linear and goal directed.  Descriptions of Associations:Intact  Orientation:Full (Time, Place and Person)  Thought Content: Future oriented.  No current suicidal thoughts.  No homicidal thoughts.  No thoughts of violence.  No negative ruminative flooding.  No guilty ruminations.  No delusional theme.  No obsessions.  Hallucinations: No hallucination in any modality.  Sensorium  Memory: Good.  Judgment: Good.  Insight: Good  Executive Functions  Concentration: Good.  Attention Span: Good.  Recall: Good.  Fund of Knowledge: Good.  Language: Good   Psychomotor Activity  Normal psychomotor activity   Physical Exam: Physical Exam ROS Blood pressure 136/85, pulse 69, temperature 98.2 F (36.8 C), temperature source Oral, resp. rate 18, height 6' 2 (1.88 m), weight 104.8 kg, SpO2 100%. Body mass index is 29.66 kg/m.  Mental Status Per Nursing Assessment::   On Admission:  Suicidal ideation indicated by patient  Demographic Factors:  Male, Adolescent or young adult, Low socioeconomic status, and Unemployed  Loss Factors: Financial problems/change in socioeconomic status  Historical Factors: Family  history of mental illness or substance abuse and Impulsivity  Risk Reduction Factors:   NA  Continued Clinical Symptoms:  Patient has completely detoxed from psychoactive substances.  He is on antipsychotic medication.  No residual psychotic symptoms.  No manic symptoms.  No evidence of depression.  No overwhelming anxiety.  Cognitive Features That Contribute To Risk:  None    Suicide Risk:  Minimal: No current suicidal thoughts.  No homicidal thoughts.  No thoughts of violence.  Modifiable risk factor targeted during this admission as psychosis.  Patient has responded well to antipsychotic regimen.  No new psychosocial stressor.  No current craving for psychoactive substances.  Risk of accidental overdose from previously tolerated opioids reinstated. Patient is stable for care at the lower setting.  Follow-up Information     [facility] Follow up on 12/21/2023.   Why: A referral has been made to this provider.  Please call on 12/21/23 at 9:00 am to schedule a hospital follow up appointment, for therapy and medication management services. It will be a Scientific laboratory technician. Contact information: [address]  [address] [place] [address] [phone]         [facility] Follow up on 12/21/2023.   Why: You may also call this provider on 12/21/23 at 9:00 am to schedule an appointment for medication management services. Contact information: [address] [place] [address] [phone]                 Plan Of Care/Follow-up recommendations:  See discharge summary.  Amelie Jury, MD 12/20/2023, 8:42 AM

## 2023-12-20 NOTE — Discharge Summary (Signed)
 Physician Discharge Summary Note  Patient:  Aaron Rubio is an 43 y.o., male MRN:  [id number] DOB:  [birth year] Patient phone:  [phone] (home)  Patient address:   [place] [address],  Total Time spent with patient: 45 minutes  Date of Admission:  12/15/2023 Date of Discharge: 12/20/2023  Reason for Admission:   The patient is a 43 y/o male with a history of previously diagnosed schizophrenia who was admitted to inpatient psychiatry after presenting to the [facility] on 6/13 with complaints of foot and hip pain, not having his prescribed medications since earlier in the week, and suicidal ideation. In the ED he reported recently being admitted and discharged from [facility] and stated that he felt he needed psychiatric admission for treatment and stabilization. He was evaulated by a PNP there and documented to exhibit odd and disorganized thinking, possible thought blocking, and he endorsed AH and passive SI.    Since admission to the unit the patient has been behaviorally appropriate and cooperative. On assessment he was moderately disorganized and while he freely and spontaneously answered questions his responses were vague and either tangential, irrelevant, and uninformative. He stated he was trying to make it and get by but has been struggling. He stated that the drugs I've been using just aren't helping anymore and he has been trying to accept what has been happening. He stated that he ran out of money and has no resources. He reports feeling depressed and hopeless about his situation and chronic homelessness. He reports presenting to the ED for foot pain and states that this has improved a bit over the past day. He poorly described back and hip pain and denied any numbness or loss of sensation in his feet. He endorsed experiencing visual and auditory hallucinations over the past couple months but endorsed AH intermittently active most recently. He had difficulty  describing the content of the voices but made statements about being promised money and other things and not receiving the help/assistance he though he was going to. He reported a prior diagnosis of schizophrenia and while he could not identify the medications he had recently been prescribed he stated that medications have helped managed his symptoms, specifically depression and  Principal Problem: Paranoid schizophrenia (HCC) Discharge Diagnoses: Principal Problem:   Paranoid schizophrenia (HCC) Active Problems:   Passive suicidal ideations   Homelessness   Past Psychiatric History:  Patient is a poor historian at this time. Per chart review he has a prior diagnosis of schizophrenia and a history of polysubstance abuse. He has been psychiatrically hospitalized multiple times throughout his life and 3+ times at [facility] over the past few months. He does not report a history of suicide attempts, but has had presentations for SI in the past. He has prior positive UDSs for cocaine.    Is the patient at risk to self? Yes.    Has the patient been a risk to self in the past 6 months? Yes.    Has the patient been a risk to self within the distant past? unknown Is the patient a risk to others? No.  Has the patient been a risk to others in the past 6 months? No.  Has the patient been a risk to others within the distant past? unknown  Past Medical History:  Past Medical History:  Diagnosis Date   Hypertension    Schizophrenia (HCC)    History reviewed. No pertinent surgical history. Family History: History reviewed. No pertinent family history. Family Psychiatric  History:  Social History:  Social History   Substance and Sexual Activity  Alcohol Use Yes     Social History   Substance and Sexual Activity  Drug Use Yes    Social History   Socioeconomic History   Marital status: Single    Spouse name: Not on file   Number of children: Not on file   Years of education: Not on file    Highest education level: Not on file  Occupational History   Not on file  Tobacco Use   Smoking status: Every Day    Current packs/day: 0.30    Types: Cigarettes   Smokeless tobacco: Not on file  Vaping Use   Vaping status: Never Used  Substance and Sexual Activity   Alcohol use: Yes   Drug use: Yes   Sexual activity: Not on file  Other Topics Concern   Not on file  Social History Narrative   Not on file   Social Drivers of Health   Financial Resource Strain: Not on file  Food Insecurity: Food Insecurity Present (12/15/2023)   Hunger Vital Sign    Worried About Running Out of Food in the Last Year: Often true    Ran Out of Food in the Last Year: Often true  Transportation Needs: Unmet Transportation Needs (12/15/2023)   PRAPARE - Administrator, Civil Service (Medical): Yes    Lack of Transportation (Non-Medical): Yes  Physical Activity: Not on file  Stress: Not on file  Social Connections: Unknown (05/08/2022)   [facility]   Social Network    Social Network: Not on file    Hospital Course:   Patient was admitted on suicide precautions.  We initiated olanzapine  which he tolerated well.  Patient responded well to treatment.  Psychosis completely resolved.  Patient had minimal participation with unit groups and therapeutic activities.  He was mostly isolating in his room.  He attended to his basic needs.  He ate his meals and drank fluids appropriately.  Sleep-wake cycle was well-regulated.  He did not require any medical or psychiatric emergency measures during his hospital stay.  At interview today.  Patient is not endorsing any worries or concerns.  No hallucination in any modality.  Patient is able to think clearly.  No overt delusional preoccupation.  No craving for psychoactive substances.  Patient is not endorsing any new worries or concerns.  No evidence of depression.  No overwhelming anxiety.  No evidence of mania.  He is tolerating his  medicines without any adverse effects.  Nursing staff reports that patient slept well last night.  No challenging behavior.  No PRNs required.  No observed response to internal stimuli.  He is not endorsing any rageful thoughts towards self or towards others.  Patient and team agrees that he is back to his baseline.  Team agrees with discharge today.  Physical Findings: AIMS:  , ,  ,  ,  ,  ,   CIWA:    COWS:     Musculoskeletal: Strength & Muscle Tone: within normal limits Gait & Station: normal Patient leans: N/A   Psychiatric Specialty Exam:  Presentation  General Appearance and behavior:  Casually dressed, not in any distress, appropriate behavior, good relatedness.  No EPS.  Eye Contact: Good.  Speech: Spontaneous.  Normal rate, tone and volume.  Normal prosody of speech.  Mood and Affect  Mood: Euthymic.  Affect: Full range and appropriate.  Thought Process  Thought Processes: Linear and  goal directed.  Descriptions of Associations:Intact  Orientation:Full (Time, Place and Person)  Thought Content: Future oriented.  No current suicidal thoughts.  No homicidal thoughts.  No thoughts of violence.  No negative ruminative flooding.  No guilty ruminations.  No delusional theme.  No obsessions.  Hallucinations: No hallucination in any modality.  Sensorium  Memory: Good.  Judgment: Good.  Insight: Good  Executive Functions  Concentration: Good.  Attention Span: Good.  Recall: Good.  Fund of Knowledge: Good.  Language: Good   Psychomotor Activity  Normal psychomotor activity    Physical Exam: Physical Exam ROS Blood pressure 136/85, pulse 69, temperature 98.2 F (36.8 C), temperature source Oral, resp. rate 18, height 6' 2 (1.88 m), weight 104.8 kg, SpO2 100%. Body mass index is 29.66 kg/m.   Social History   Tobacco Use  Smoking Status Every Day   Current packs/day: 0.30   Types: Cigarettes  Smokeless Tobacco Not on file    Tobacco Cessation:  N/A, patient does not currently use tobacco products   Blood Alcohol level:  Lab Results  Component Value Date   [facility] <15 12/14/2023   ETH <15 12/06/2023    Metabolic Disorder Labs:  Lab Results  Component Value Date   HGBA1C 5.0 10/27/2023   MPG 96.8 10/27/2023   No results found for: PROLACTIN Lab Results  Component Value Date   CHOL 152 10/19/2023   TRIG 144 10/19/2023   HDL 57 10/19/2023   CHOLHDL 2.7 10/19/2023   VLDL 29 10/19/2023   LDLCALC 66 10/19/2023    See Psychiatric Specialty Exam and Suicide Risk Assessment completed by Attending Physician prior to discharge.  Discharge destination:  Home  Is patient on multiple antipsychotic therapies at discharge:  No   Has Patient had three or more failed trials of antipsychotic monotherapy by history:  No  Recommended Plan for Multiple Antipsychotic Therapies: NA  Discharge Instructions     Diet - low sodium heart healthy   Complete by: As directed    Increase activity slowly   Complete by: As directed       Allergies as of 12/20/2023   No Known Allergies      Medication List     TAKE these medications      Indication  amLODipine  10 MG tablet Commonly known as: NORVASC  Take 1 tablet (10 mg total) by mouth daily.  Indication: High Blood Pressure   OLANZapine  15 MG tablet Commonly known as: ZYPREXA  Take 2 tablets (30 mg total) by mouth at bedtime.  Indication: Schizophrenia        Follow-up Information     [facility] Follow up on 12/21/2023.   Why: A referral has been made to this provider.  Please call on 12/21/23 at 9:00 am to schedule a hospital follow up appointment, for therapy and medication management services. It will be a Scientific laboratory technician. Contact information: [address]  [address] [place] [address] [phone]         [facility] Follow up on 12/21/2023.   Why: You may also call this provider on 12/21/23 at 9:00 am to schedule an  appointment for medication management services. Contact information: [address] [place] [address] [phone]                 Follow-up recommendations:   Patient will stay on medication as recommended.  No restrictions with respect to activity or diet.  Patient encouraged to stay off psychoactive substances.  Signed: Amelie Jury, MD 12/20/2023, 8:47 AM

## 2023-12-20 NOTE — Progress Notes (Addendum)
  [facility] Adult Case Management Discharge Plan :  Will you be returning to the same living situation after discharge:  Yes,  patient is unhoused At discharge, do you have transportation home?: Yes,  CSW arranged transportation through [facility] for 11:00 PM to [facility]) Do you have the ability to pay for your medications: Yes,  patient has insurance   Release of information consent forms completed and in the chart;  Patient's signature needed at discharge.  Patient to Follow up at:  Follow-up Information     [facility] Follow up on 12/21/2023.   Why: A referral has been made to this provider.  Please call on 12/21/23 at 9:00 am to schedule a hospital follow up appointment, for therapy and medication management services. It will be a Scientific laboratory technician. Contact information: [address]  [address] [place] [address] [phone]         [facility] Follow up on 12/21/2023.   Why: You may also call this provider on 12/21/23 at 9:00 am to schedule an appointment for medication management services. Contact information: [address] [place] [address] [phone]                 Next level of care provider has access to [facility] Link:no  Safety Planning and Suicide Prevention discussed: Yes,  with patient  Has patient been referred to the Quitline?: Patient refused referral for treatment  Patient has been referred for addiction treatment: Patient refused referral for treatment.  3 weeks ago patient tested positive for cocaine.  During the initial assessment on 12/17/2023, patient stated that he wasn't interested in substance use services.  Today, patient said that he would like to quit using substances.  When asked if CSW could give him a list of inpatient treatment facilities, he declined.  CSW asked if he would like to talk with an outpatient therapist about substance use, he said no.  He said, I'm all  right.   Aaron Rubio, LCSWA 12/20/2023, 8:42 AM

## 2023-12-20 NOTE — [facility] Suicide Risk Assessment (Signed)
 [facility] INPATIENT:  Family/Significant Other Suicide Prevention Education  Suicide Prevention Education:  Education Completed; with patient,  (name of family member/significant other) has been identified by the patient as the family member/significant other with whom the patient will be residing, and identified as the person(s) who will aid the patient in the event of a mental health crisis (suicidal ideations/suicide attempt).  With written consent from the patient, the family member/significant other has been provided the following suicide prevention education, prior to the and/or following the discharge of the patient.  Patient was given Suicide Prevention Information brochure.  Patient said he doesn't have any guns or weapons.  The suicide prevention education provided includes the following: Suicide risk factors Suicide prevention and interventions National Suicide Hotline telephone number [facility] assessment telephone number [facility] Emergency Assistance 911 [facility] and/or Residential Mobile Crisis Unit telephone number  Request made of family/significant other to: Remove weapons (e.g., guns, rifles, knives), all items previously/currently identified as safety concern.   Remove drugs/medications (over-the-counter, prescriptions, illicit drugs), all items previously/currently identified as a safety concern.  The family member/significant other verbalizes understanding of the suicide prevention education information provided.  The family member/significant other agrees to remove the items of safety concern listed above.  Deona Novitski O Dong Nimmons, LCSWA 12/20/2023, 9:01 AM

## 2023-12-20 NOTE — Transportation (Signed)
 12/20/2023  Aaron Rubio DOB: 1981/06/18 MRN: [id number]   RIDER WAIVER AND RELEASE OF LIABILITY  For the purposes of helping with transportation needs, [facility] partners with outside transportation providers (taxi companies, [place], Catering manager.) to give [facility] patients or other approved people the choice of on-demand rides Public librarian) to our buildings for non-emergency visits.  By using [employer], I, the person signing this document, on behalf of myself and/or any legal minors (in my care using the [employer]), agree:  Science writer given to me are supplied by independent, outside transportation providers who do not work for, or have any affiliation with, [employer]. [place] is not a transportation company. [facility] has no control over the quality or safety of the rides I get using [employer]. [place] has no control over whether any outside ride will happen on time or not. [facility] gives no guarantee on the reliability, quality, safety, or availability on any rides, or that no mistakes will happen. I know and accept that traveling by vehicle (car, truck, SVU, [place] Chi, bus, taxi, etc.) has risks of serious injuries such as disability, being paralyzed, and death. I know and agree the risk of using [employer] is mine alone, and not [employer]. [employer] are provided as is and as are available. The transportation providers are in charge for all inspections and care of the vehicles used to provide these rides. I agree not to take legal action against [place], its agents, employees, officers, directors, representatives, insurers, attorneys, assigns, successors, subsidiaries, and affiliates at any time for any reasons related directly or indirectly to using [employer]. I also agree not to take legal action against [place] or its affiliates for any injury, death, or damage to property caused by or related to  using [employer]. I have read this Waiver and Release of Liability, and I understand the terms used in it and their legal meaning. This Waiver is freely and voluntarily given with the understanding that my right (or any legal minors) to legal action against [place] relating to [employer] is knowingly given up to use these services.   I attest that I read the Ride Waiver and Release of Liability to Aaron Rubio, gave Aaron Rubio the opportunity to ask questions and answered the questions asked (if any). I affirm that Aaron Rubio then provided consent for assistance with transportation.     Adelma Bowdoin, LCSWA 12/20/2023

## 2023-12-20 NOTE — Progress Notes (Addendum)
 Collateral contact   Per Mr. Peterson's (SOAR program), CSW informed Drezden Seitzinger [phone] [facility] Financial Navigator) about the referral.  Per Antonio's request, CSW emailed the referral via secure email.   Nox Talent, LCSWA 12/19/2023

## 2023-12-20 NOTE — Progress Notes (Signed)
 Pt discharged to lobby. Pt was stable and appreciative at that time. All papers and prescriptions were given and valuables returned. Verbal understanding expressed. Denies SI/HI and A/VH. Pt given opportunity to express concerns and ask questions.

## 2023-12-21 ENCOUNTER — Ambulatory Visit (HOSPITAL_COMMUNITY)
Admission: EM | Admit: 2023-12-21 | Discharge: 2023-12-21 | Disposition: A | Discharge status: Home / Self Care | Attending: Psychiatry | Admitting: Psychiatry

## 2023-12-21 DIAGNOSIS — Z79899 Other long term (current) drug therapy: Secondary | ICD-10-CM | POA: Diagnosis not present

## 2023-12-21 DIAGNOSIS — Z765 Malingerer [conscious simulation]: Secondary | ICD-10-CM

## 2023-12-21 DIAGNOSIS — R443 Hallucinations, unspecified: Secondary | ICD-10-CM

## 2023-12-21 MED ORDER — NAPROXEN 500 MG PO TABS
500.0000 mg | ORAL_TABLET | Freq: Once | ORAL | Status: AC
  Administered 2023-12-21: 500 mg via ORAL
  Filled 2023-12-21: qty 1

## 2023-12-21 NOTE — Progress Notes (Signed)
   12/21/23 1648  [facility] Triage Screening (Walk-ins at [facility] only)  How Did You Hear About Us ? Self  What Is the Reason for Your Visit/Call Today? Pt presents to [facility] and states that he is needing medication because he has been hallucinating. Pt reports that he is not eating and is looking to speak to a case worker. Pt has a hx of drinking alcohol off and on. Pt states he last drank 2 days ago. Pt appears to be slightly disoriented throughout triage and has a hard time in tracking the questions being asked. He states, my feet started hurting today and that is what made me feel like I am crisis. Pt denies substance use, SI, HI and AVH.  How Long Has This Been Causing You Problems? <Week  Have You Recently Had Any Thoughts About Hurting Yourself? No  Are You Planning to Commit Suicide/Harm Yourself At This time? No  Have you Recently Had Thoughts About Hurting Someone [name]? No  Are You Planning To Harm Someone At This Time? No  Physical Abuse Denies  Verbal Abuse Denies  Sexual Abuse Denies  Exploitation of patient/patient's resources Denies  Self-Neglect Denies  Possible abuse reported to: Other (Comment)  Are you currently experiencing any auditory, visual or other hallucinations? No  Have You Used Any Alcohol or Drugs in the Past 24 Hours? No  Do you have any current medical co-morbidities that require immediate attention? No  What Do You Feel Would Help You the Most Today? Medication(s)  If access to [facility] was not available, would you have sought care in the Emergency Department? No  Determination of Need Routine (7 days)  Options For Referral Medication Management

## 2023-12-21 NOTE — ED Provider Notes (Signed)
 [facility] Medical Screening Exam  Patient Name: Aaron [facility] MRN: [id number] Date of Evaluation: 12/21/23 Chief Complaint:  I need some meds.  Diagnosis:  Final diagnoses:  Hallucinations, unspecified  Medication management  Malingering    History of Present illness: [facility] 43 y.o., male patient presented to [facility] as a voluntary walk in unaccompanied with complaints of wanting medications and auditory hallucinations as well as foot pain. [facility], is seen face to face by this provider and chart reviewed on 12/21/23. Per chart review patient has a past psychiatric history of paranoid schizophrenia and polysubstance use.  He was discharged from [facility] and has already picked up his medications from the pharmacy including Zyprexa  10 mg and Amlodipine  10 mg.  He was supposed to follow-up with [facility] or [facility] today however states that he does not have insurance and they will not treat him there.  Patient has had multiple ED visits and Rubio hospitalizations within the past couple of months but does not follow up with outpatient psychiatry and is noncompliant with prescribed medications. Pt also continues to abuse substances including crack-cocaine and is currently homeless.   On evaluation [facility] reports that they gave me the wrong meds over there at that [facility] on [facility]).  Patient endorses that he was discharged from [facility] and use some powdery drugs last night.  Patient states that he had been drinking alcohol for the past couple days and has not been sleeping.  When I asked patient how he had been drinking alcohol while at the [facility], he then states that he was at the [facility] and that they were lying about him being discharged Rubio, per charting.  Patient's statements are inconsistent and he appears to be at this visit for secondary gain as he asked for food,  juice and shoes.  Patient also mentions that he is currently homeless.  He does report hearing voices but denies that they are command in nature.  Patient also displayed some paranoia which appears chronic and consistent with his paranoid schizophrenia diagnosis and Rubio visits.  He denies any suicidal ideations or homicidal ideations.  Patient does report pain to both of his feet from walking on them.  Patient does have a history of making suicidal statements however no history of actual suicide attempts or self-harm.  Discussed with patient that he has already been given his prescribed medications that were effective as of Rubio during his hospitalization at [facility] and that he needs to continue with those medications and follow-up with outpatient resources that were provided to him.  Patient was given a pair of sneakers and socks but social work team and was also provided with sandwiches and juice as requested.  During evaluation [facility] is sitting up in assessment room, in no acute distress.  He is alert & oriented x 4, cooperative and attentive for this assessment.  His mood is somewhat irritable with congruent flat affect.  He has normal speech, and behavior.  Patient does have some paranoid thoughts about providers prescribing him the wrong medications, however these do not appear to be harmful in nature..  Pt does not appear to be responding to internal or external stimuli but does report ongoing auditory hallucinations that are not command in nature. He also denies suicidal/self-harm and homicidal ideations. Patient answered assessment questions appropriately.    Flowsheet Row ED from 12/21/2023 in [facility] (  Discharged) from 12/15/2023 in [facility] from 12/14/2023 in [facility]  C-SSRS RISK CATEGORY Error: Q3, 4, or 5 should not be populated when Q2 is No Low Risk Low Risk     Psychiatric Specialty Exam  Presentation  General Appearance:Disheveled  Aaron Rubio Contact:Good  Speech:Clear and Coherent  Speech Volume:Normal  Handedness:Right   Mood and Affect  Mood: Irritable  Affect: Flat   Thought Process  Thought Processes: Linear; Irrevelant  Descriptions of Associations:Circumstantial  Orientation:Full (Time, Place and Person)  Thought Content:WDL  Diagnosis of Schizophrenia or Schizoaffective disorder in past: Yes  Duration of Psychotic Symptoms: Greater than six months  Hallucinations:Auditory hears peple bragging denies  Ideas of Reference:None  Suicidal Thoughts:No Without Intent; Without Plan  Homicidal Thoughts:No   Sensorium  Memory: Immediate Fair; Recent Fair  Judgment: Poor  Insight: Poor   Executive Functions  Concentration: Fair  Attention Span: Fair  Recall: [employer] of Knowledge: Fair  Language: Fair   Psychomotor Activity  Psychomotor Activity: Normal   Assets  Assets: Housing; Physical Rubio; Social Support; Rubio and safety inspector; Desire for Improvement   Sleep  Sleep: Poor  Number of hours:  4   Physical Exam: Physical Exam Vitals and nursing note reviewed.  Constitutional:      Appearance: Normal appearance.  HENT:     Head: Normocephalic.     Nose: Nose normal.   Eyes:     Extraocular Movements: Extraocular movements intact.    Cardiovascular:     Rate and Rhythm: Normal rate.  Pulmonary:     Effort: Pulmonary effort is normal.   Musculoskeletal:        General: Normal range of motion.     Cervical back: Normal range of motion.     Comments: Bilateral foot pain   Neurological:     General: No focal deficit present.     Mental Status: He is alert and oriented to person, place, and time.    Review of Systems  Constitutional: Negative.   HENT: Negative.    Eyes: Negative.   Respiratory: Negative.    Cardiovascular: Negative.    Gastrointestinal: Negative.   Genitourinary: Negative.   Musculoskeletal: Negative.        Bilateral foot pain  Neurological: Negative.   Endo/Heme/Allergies: Negative.   Psychiatric/Aaron Rubio:  Positive for hallucinations and substance abuse.    Blood pressure 134/79, pulse 77, temperature 98.7 F (37.1 C), temperature source Oral, resp. rate 20, SpO2 99%. There is no height or weight on file to calculate BMI.  Musculoskeletal: Strength & Muscle Tone: within normal limits Gait & Station: normal Patient leans: N/A   [facility] MSE Discharge Disposition for Follow up and Recommendations: Based on my evaluation the patient does not appear to have an emergency medical condition and can be discharged with resources and follow up care in outpatient services for Medication Management and Individual Therapy Patient is discharged from [facility] urgent care with instructions to follow-up with outpatient resources that were provided to him at this visit and at his Rubio discharge from [facility].  Explained to patient that he has already received the prescription for his amlodipine  and Zyprexa  10 mg twice daily and that he needs to take it twice a day.  Reminded patient that these medications, based on chart review, were effective during his Rubio hospitalization which is why he was prescribed and continued on this medication regimen.  Patient was irritable at discharge and demanding to receive Rubio hospitalization  again.  Explained the patient that he did not meet criteria for Rubio hospitalization as he was just discharged Rubio and has a 30-day supply of his medications to bridge him through until his outpatient follow-up appointment.  Patient escorted out by security.  Patient was administered naproxen  500 mg one-time for bilateral foot pain and mild swelling from walking on his feet and being homeless.  Davia Erps, NP 12/21/2023, 6:03 PM

## 2023-12-21 NOTE — Discharge Instructions (Addendum)
 Discharge recommendations:   Medications: Please follow up with outpatient psychiatry to continue medication management. Patient is to take medications as prescribed. The patient or patient's guardian is to contact a medical professional and/or outpatient provider to address any new side effects that develop. The patient or the patient's guardian should update outpatient providers of any new medications and/or medication changes.    Outpatient Follow up: Please review list of outpatient resources for psychiatry and counseling. Please follow up with your primary care provider for all medical related needs.    Therapy: We recommend that patient participate in individual therapy to address mental health concerns.   Atypical antipsychotics: If you are prescribed an atypical antipsychotic, it is recommended that your height, weight, BMI, blood pressure, fasting lipid panel, and fasting blood sugar be monitored by your outpatient providers.  Safety:   The following safety precautions should be taken:   No sharp objects. This includes scissors, razors, scrapers, and putty knives.   Chemicals should be removed and locked up.   Medications should be removed and locked up.   Weapons should be removed and locked up. This includes firearms, knives and instruments that can be used to cause injury.   The patient should abstain from use of illicit substances/drugs and abuse of any medications.  If symptoms worsen or do not continue to improve or if the patient becomes actively suicidal or homicidal then it is recommended that the patient return to the closest hospital emergency department, the [facility], or call 911 for further evaluation and treatment. National Suicide Prevention Lifeline 1-800-SUICIDE or 250-773-8785.  About 988 988 offers 24/7 access to trained crisis counselors who can help people experiencing mental health-related distress. People can call or  text 988 or chat 988lifeline.org for themselves or if they are worried about a loved one who may need crisis support.

## 2023-12-23 ENCOUNTER — Emergency Department (HOSPITAL_COMMUNITY)
Admission: EM | Admit: 2023-12-23 | Discharge: 2023-12-24 | Disposition: A | Discharge status: Other Acute Inpatient Hospital | Attending: Emergency Medicine | Admitting: Emergency Medicine

## 2023-12-23 ENCOUNTER — Ambulatory Visit (HOSPITAL_COMMUNITY): Admission: EM | Admit: 2023-12-23 | Discharge: 2023-12-23 | Disposition: A | Discharge status: Home / Self Care

## 2023-12-23 ENCOUNTER — Other Ambulatory Visit: Payer: Self-pay

## 2023-12-23 ENCOUNTER — Encounter (HOSPITAL_COMMUNITY): Payer: Self-pay

## 2023-12-23 DIAGNOSIS — R451 Restlessness and agitation: Secondary | ICD-10-CM | POA: Insufficient documentation

## 2023-12-23 DIAGNOSIS — F29 Unspecified psychosis not due to a substance or known physiological condition: Secondary | ICD-10-CM | POA: Diagnosis present

## 2023-12-23 DIAGNOSIS — Z79899 Other long term (current) drug therapy: Secondary | ICD-10-CM | POA: Diagnosis not present

## 2023-12-23 DIAGNOSIS — Z5900 Homelessness unspecified: Secondary | ICD-10-CM

## 2023-12-23 DIAGNOSIS — I10 Essential (primary) hypertension: Secondary | ICD-10-CM | POA: Diagnosis not present

## 2023-12-23 DIAGNOSIS — F419 Anxiety disorder, unspecified: Secondary | ICD-10-CM | POA: Diagnosis not present

## 2023-12-23 DIAGNOSIS — F200 Paranoid schizophrenia: Secondary | ICD-10-CM | POA: Diagnosis not present

## 2023-12-23 DIAGNOSIS — F209 Schizophrenia, unspecified: Secondary | ICD-10-CM | POA: Diagnosis present

## 2023-12-23 DIAGNOSIS — F1910 Other psychoactive substance abuse, uncomplicated: Secondary | ICD-10-CM | POA: Insufficient documentation

## 2023-12-23 DIAGNOSIS — M545 Low back pain, unspecified: Secondary | ICD-10-CM | POA: Diagnosis not present

## 2023-12-23 DIAGNOSIS — R45851 Suicidal ideations: Secondary | ICD-10-CM | POA: Diagnosis not present

## 2023-12-23 DIAGNOSIS — F22 Delusional disorders: Secondary | ICD-10-CM | POA: Diagnosis not present

## 2023-12-23 LAB — COMPREHENSIVE METABOLIC PANEL WITH GFR
ALT: 31 U/L (ref 0–44)
AST: 38 U/L (ref 15–41)
Albumin: 3.6 g/dL (ref 3.5–5.0)
Alkaline Phosphatase: 57 U/L (ref 38–126)
Anion gap: 7 (ref 5–15)
BUN: 22 mg/dL — ABNORMAL HIGH (ref 6–20)
CO2: 23 mmol/L (ref 22–32)
Calcium: 8.6 mg/dL — ABNORMAL LOW (ref 8.9–10.3)
Chloride: 106 mmol/L (ref 98–111)
Creatinine, Ser: 1.23 mg/dL (ref 0.61–1.24)
GFR, Estimated: >60 mL/min (ref >60)
Glucose, Bld: 105 mg/dL — ABNORMAL HIGH (ref 70–99)
Potassium: 3.5 mmol/L (ref 3.5–5.1)
Sodium: 136 mmol/L (ref 135–145)
Total Bilirubin: 0.8 mg/dL (ref 0.0–1.2)
Total Protein: 6.3 g/dL — ABNORMAL LOW (ref 6.5–8.1)

## 2023-12-23 LAB — RAPID URINE DRUG SCREEN, HOSP PERFORMED
Amphetamines: NOT DETECTED
Barbiturates: NOT DETECTED
Benzodiazepines: NOT DETECTED
Cocaine: POSITIVE — AB
Opiates: NOT DETECTED
Tetrahydrocannabinol: POSITIVE — AB

## 2023-12-23 LAB — CBC WITH DIFFERENTIAL/PLATELET
Abs Immature Granulocytes: 0.03 10*3/uL (ref 0.00–0.07)
Basophils Absolute: 0.1 10*3/uL (ref 0.0–0.1)
Basophils Relative: 1 %
Eosinophils Absolute: 0.9 10*3/uL — ABNORMAL HIGH (ref 0.0–0.5)
Eosinophils Relative: 11 %
HCT: 39.3 % (ref 39.0–52.0)
Hemoglobin: 12.3 g/dL — ABNORMAL LOW (ref 13.0–17.0)
Immature Granulocytes: 0 %
Lymphocytes Relative: 29 %
Lymphs Abs: 2.5 10*3/uL (ref 0.7–4.0)
MCH: 26.6 pg (ref 26.0–34.0)
MCHC: 31.3 g/dL (ref 30.0–36.0)
MCV: 84.9 fL (ref 80.0–100.0)
Monocytes Absolute: 0.9 10*3/uL (ref 0.1–1.0)
Monocytes Relative: 11 %
Neutro Abs: 4.3 10*3/uL (ref 1.7–7.7)
Neutrophils Relative %: 48 %
Platelets: 212 10*3/uL (ref 150–400)
RBC: 4.63 MIL/uL (ref 4.22–5.81)
RDW: 13.7 % (ref 11.5–15.5)
WBC: 8.8 10*3/uL (ref 4.0–10.5)
nRBC: 0 % (ref 0.0–0.2)

## 2023-12-23 LAB — ACETAMINOPHEN LEVEL: Acetaminophen (Tylenol), Serum: <10 ug/mL — ABNORMAL LOW (ref 10–30)

## 2023-12-23 LAB — ETHANOL: Alcohol, Ethyl (B): <15 mg/dL (ref <15)

## 2023-12-23 MED ORDER — LIDOCAINE 5 % EX PTCH: 1.0000 | MEDICATED_PATCH | CUTANEOUS | Status: DC

## 2023-12-23 MED ORDER — ZIPRASIDONE MESYLATE 20 MG IM SOLR: 20.0000 mg | INTRAMUSCULAR | Status: DC | PRN

## 2023-12-23 MED ORDER — RISPERIDONE 0.5 MG PO TBDP
2.0000 mg | ORAL_TABLET | Freq: Three times a day (TID) | ORAL | Status: DC | PRN
Start: 2023-12-23 — End: 2023-12-24

## 2023-12-23 MED ORDER — LORAZEPAM 1 MG PO TABS
1.0000 mg | ORAL_TABLET | ORAL | Status: DC | PRN
Start: 2023-12-23 — End: 2023-12-24

## 2023-12-23 MED ORDER — OLANZAPINE 10 MG PO TBDP: 30.0000 mg | ORAL_TABLET | Freq: Every day | ORAL | Status: DC

## 2023-12-23 MED ORDER — OLANZAPINE 5 MG PO TBDP
5.0000 mg | ORAL_TABLET | ORAL | Status: AC
  Administered 2023-12-23: 5 mg via ORAL
  Filled 2023-12-23: qty 1

## 2023-12-23 MED ORDER — HYDROXYZINE HCL 25 MG PO TABS: 25.0000 mg | ORAL_TABLET | Freq: Three times a day (TID) | ORAL | Status: DC | PRN

## 2023-12-23 NOTE — ED Notes (Signed)
 [facility] RN, Dorn, is to call back soon for report on this pt.
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
## 2023-12-23 NOTE — Care Management (Signed)
 Transition of Care [facility]) - Emergency Department Mini Assessment   Patient Details  Name: Aaron Rubio MRN: [id number] Date of Birth: 1981/01/06  Transition of Care [facility]) CM/SW Contact:    Corean JAYSON Canary, RN Phone Number: 12/23/2023, 8:38 AM   Clinical Narrative:  Patient presented with Si via Law enforcement, not detained. Just discharged from [facility], refused to leave. Consult for medication assistance. The patient has insurance, therefore does not qualify for [facility] medication assistance. He does not have a PCP, needs to call to set up PCP so he canget his medications.   ED Mini Assessment:    Barriers to Discharge: Homeless with medical needs        Interventions which prevented an admission or readmission: Medication Review    Patient Contact and Communications        ,                 Admission diagnosis:  hallucinations Patient Active Problem List   Diagnosis Date Noted   Homelessness 12/16/2023   Paranoid schizophrenia (HCC) 12/15/2023   Chronic paranoid schizophrenia (HCC) 10/31/2023   Passive suicidal ideations 05/05/2023   Psychosis (HCC) 05/05/2023   Polysubstance abuse (HCC) 05/05/2023   PCP:  Freddrick, No Pharmacy:   [facility] [phone] - [place], [place] - [address] AT [facility] [address] [place] [address] Phone: [phone] Fax: [phone]
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
## 2023-12-23 NOTE — ED Notes (Signed)
 Pt has room at [facility] 300-2.  Forms faxed over.  Pt is voluntary.  Pt can arrive after 8 pm tonight.

## 2023-12-23 NOTE — ED Triage Notes (Signed)
 Pt arrives via [employer], voluntarily and not detained. Law Enforcement states patient was discharged from [facility] and refused to leave. Pt told LEO that he needed to come to the ED. PT arrives [facility]. He states he is suicidal with a plan to hang himself.

## 2023-12-23 NOTE — Discharge Instructions (Addendum)
  Discharge recommendations:  Patient is to take medications as prescribed. Please see information for follow-up appointment with psychiatry and therapy. Please follow up with your primary care provider for all medical related needs.   Therapy: We recommend that patient participate in individual therapy to address mental health concerns.  Medications: The patient or guardian is to contact a medical professional and/or outpatient provider to address any new side effects that develop. The patient or guardian should update outpatient providers of any new medications and/or medication changes.   Atypical antipsychotics: If you are prescribed an atypical antipsychotic, it is recommended that your height, weight, BMI, blood pressure, fasting lipid panel, and fasting blood sugar be monitored by your outpatient providers.  Safety:  The patient should abstain from use of illicit substances/drugs and abuse of any medications. If symptoms worsen or do not continue to improve or if the patient becomes actively suicidal or homicidal then it is recommended that the patient return to the closest hospital emergency department, the [facility], or call 911 for further evaluation and treatment. National Suicide Prevention Lifeline 1-800-SUICIDE or 940-359-6212.  About 988 988 offers 24/7 access to trained crisis counselors who can help people experiencing mental health-related distress. People can call or text 988 or chat 988lifeline.org for themselves or if they are worried about a loved one who may need crisis support.  Crisis Mobile: [facility]:                     6142364716 (for crisis response 24 hours a day) [facility] Hotline:                                            559-584-2531    [facility] [address] [place], [place] , [place] [address] [phone]

## 2023-12-23 NOTE — ED Provider Notes (Signed)
 Behavioral Health Urgent Care Medical Screening Exam  Patient Name: Aaron Rubio MRN: [id number] Date of Evaluation: 12/23/23 Chief Complaint:  substance abuse treatment Diagnosis:  Final diagnoses:  Paranoid schizophrenia (HCC)  Homeless    History of Present illness: Aaron Rubio is a 43 y/o male with a psychiatric history significant for paranoid schizophrenia, suicidal ideation, depression and polysubstance abuse presented voluntarily to [facility] as a walk in unaccompanied by with complaints of wanting assistance for substance abuse.   Aaron Rubio, 43 y.o., male patient seen face to face by this provider and chart reviewed on 12/23/23.  On evaluation [name] reports that he has a substance abuse issue that he wants to get help with.  Patient states that he wants to go to [facility] for substance abuse treatment. Patient did not respond when asked  Patient was recently discharged from [facility] on 12/20/2023 and was referred to [facility] for post discharge follow up care.  This is patient's 12th ED visit in the last 6 months. Per chart review patient was discharged with a 30 day supply of medications on 12/20/23.  During evaluation Aaron Rubio is sitting in the assessment room in no acute distress. He is alert, oriented x 4, calm, cooperative and attentive. His mood is euthymic with congruent affect.  He has some pressured speech, and behavior.  Objectively there is no evidence of psychosis/mania or delusional thinking.  Patient is able to converse coherently, goal directed thoughts,  and no distractibility, He also denies suicidal/self-harm/homicidal ideation, psychosis, and paranoia.  Patient answered question appropriately.    Patient discharged with community resources to follow up care in outpatient services for Medication Management and Individual Therapy with [facility] and substance treatment with [facility].     Flowsheet Row ED from 12/21/2023 in [facility] Admission (Discharged) [facility] from 12/14/2023 in [facility]  C-SSRS RISK CATEGORY Error: Q3, 4, or 5 should not be populated when Q2 is No Low Risk Low Risk    Psychiatric Specialty Exam  Presentation  General Appearance:Disheveled  Eye Contact:Good  Speech:Slow  Speech Volume:Normal  Handedness:Right   Mood and Affect  Mood: Euthymic  Affect: Flat   Thought Process  Thought Processes: Irrevelant  Descriptions of Associations:Tangential  Orientation:Partial  Thought Content:WDL  Diagnosis of Schizophrenia or Schizoaffective disorder in past: Yes  Duration of Psychotic Symptoms: Greater than six months  Hallucinations:None denies denies  Ideas of Reference:None  Suicidal Thoughts:No Without Intent; Without Plan  Homicidal Thoughts:No   Sensorium  Memory: Immediate Poor; Recent Poor; Remote Poor  Judgment: Poor  Insight: Poor   Executive Functions  Concentration: Fair  Attention Span: Fair  Recall: Fair  Fund of Knowledge: Fair  Language: Fair   Psychomotor Activity  Psychomotor Activity: Normal   Assets  Assets: Desire for Improvement; Resilience   Sleep  Sleep: Poor  Number of hours:  4   Physical Exam: Physical Exam HENT:     Head: Normocephalic.     Nose: Nose normal.   Eyes:     Pupils: Pupils are equal, round, and reactive to light.   Pulmonary:     Effort: Pulmonary effort is normal.  Abdominal:     General: Abdomen is flat.   Musculoskeletal:        General: Normal range of motion.     Cervical back: Normal range of motion.   Skin:  General: Skin is warm.   Neurological:     Mental Status: He is alert and oriented to person, place, and time.   Psychiatric:        Attention and Perception: He is inattentive.        Mood and Affect: Affect is blunt and flat.        Speech:  Speech is delayed.        Behavior: Behavior is agitated and slowed.        Thought Content: Thought content is not paranoid or delusional. Thought content does not include homicidal or suicidal ideation. Thought content does not include homicidal or suicidal plan.        Cognition and Memory: Cognition normal.        Judgment: Judgment is impulsive.    Review of Systems  Constitutional: Negative.   HENT: Negative.    Eyes: Negative.   Respiratory: Negative.    Cardiovascular: Negative.   Gastrointestinal: Negative.   Genitourinary: Negative.   Musculoskeletal: Negative.   Skin: Negative.   Neurological: Negative.   Endo/Heme/Allergies: Negative.   Psychiatric/Behavioral:  Positive for substance abuse.    Blood pressure (!) 113/93, pulse (!) 107, temperature 98.5 F (36.9 C), temperature source Oral, resp. rate 18, SpO2 100%. There is no height or weight on file to calculate BMI.  Musculoskeletal: Strength & Muscle Tone: within normal limits Gait & Station: normal Patient leans: N/A   [facility] MSE Discharge Disposition for Follow up and Recommendations: Based on my evaluation the patient does not appear to have an emergency medical condition and can be discharged with resources and follow up care in outpatient services for Medication Management and Individual Therapy   [name] E [name], NP 12/23/2023, 6:01 AM

## 2023-12-23 NOTE — ED Provider Notes (Signed)
 [place] EMERGENCY DEPARTMENT AT [facility] Provider Note   CSN: [id number] Arrival date & time: 12/23/23  9346     Patient presents with: Suicidal   Aaron Rubio is a 43 y.o. male.   HPI     43 year old male with a history of paranoid schizophrenia, suicidal ideation, depression, polysubstance abuse, hypertension  who presents with concern that he has not been able to get his medications.   He was seen this morning at [facility] and was discharged with community resources to follow-up in outpatient services for medication management, individual therapy with [facility] and substance treatment with [facility].  He has had psychiatric admissions throughout his life, and 4 times at [facility] over the last few months.  Per psychiatry notes, he was referred to [facility] post discharge follow-up on 19 June and he was discharged with a 30-day supply of his medications on that day.  He had denied SI per the note this morning.  He has no history of suicide attempts or self harm but has had several hospitalizations and frequent presentations for SI.  He was brought in today because law enforcement states he was discharged from [facility] again refused to leave.  He then told law enforcement he needed to come to the emergency department.  He told the triage nurse that he was suicidal with a plan to hang himself, but on my evaluation he reports he is here because he has not been able to take his Seroquel  for the last 4 days and reports back pain and foot pain. When I asked if he is suicidal, he reports that he is with the plan to strangulate himself.  Reports this pain in his back and feet have been present for a week. Denies weakness, numbness, abd pain, fever, nausea, vomiting, hx of IVDU, hx of trauma.   Past Medical History:  Diagnosis Date   Hypertension    Schizophrenia (HCC)      Prior to Admission medications   Medication Sig Start Date End Date Taking? Authorizing Provider  gabapentin   (NEURONTIN ) 300 MG capsule Take 300 mg by mouth 2 (two) times daily.   Yes [provider]  naproxen  (EC NAPROSYN ) 500 MG EC tablet Take 500 mg by mouth 2 (two) times daily with a meal.   Yes [provider]  QUEtiapine  (SEROQUEL ) 200 MG tablet Take 200 mg by mouth 2 (two) times daily.   Yes [provider]  amLODipine  (NORVASC ) 10 MG tablet Take 1 tablet (10 mg total) by mouth daily. Patient not taking: Reported on 12/23/2023 12/20/23   Hinda Jerrell LABOR, MD  OLANZapine  (ZYPREXA ) 15 MG tablet Take 2 tablets (30 mg total) by mouth at bedtime. Patient not taking: Reported on 12/23/2023 12/20/23   Hinda Jerrell LABOR, MD    Allergies: Patient has no known allergies.    Review of Systems  Updated Vital Signs BP 123/82 (BP Location: Right Arm)   Pulse 74   Temp 97.6 F (36.4 C) (Oral)   Resp 16   SpO2 100%   Physical Exam Vitals and nursing note reviewed.  Constitutional:      General: He is not in acute distress.    Appearance: He is well-developed. He is not diaphoretic.  HENT:     Head: Normocephalic and atraumatic.   Eyes:     Conjunctiva/sclera: Conjunctivae normal.    Cardiovascular:     Rate and Rhythm: Normal rate and regular rhythm.     Heart sounds: Normal heart sounds. No  murmur heard.    No friction rub. No gallop.  Pulmonary:     Effort: Pulmonary effort is normal. No respiratory distress.     Breath sounds: Normal breath sounds. No wheezing or rales.  Abdominal:     General: There is no distension.     Palpations: Abdomen is soft.     Tenderness: There is no abdominal tenderness. There is no guarding.   Musculoskeletal:        General: Tenderness present. Injury: mild low back tenderness.    Cervical back: Normal range of motion.   Skin:    General: Skin is warm and dry.     Comments: Small ulcer foot, no surrounding erythema Thickened nails   Neurological:     Mental Status: He is alert and oriented to person, place, and time.      Comments: Normal strength and sensation lower extremities    (all labs ordered are listed, but only abnormal results are displayed) Labs Reviewed  COMPREHENSIVE METABOLIC PANEL WITH GFR - Abnormal; Notable for the following components:      Result Value   Glucose, Bld 105 (*)    BUN 22 (*)    Calcium 8.6 (*)    Total Protein 6.3 (*)    All other components within normal limits  RAPID URINE DRUG SCREEN, HOSP PERFORMED - Abnormal; Notable for the following components:   Cocaine POSITIVE (*)    Tetrahydrocannabinol POSITIVE (*)    All other components within normal limits  CBC WITH DIFFERENTIAL/PLATELET - Abnormal; Notable for the following components:   Hemoglobin 12.3 (*)    Eosinophils Absolute 0.9 (*)    All other components within normal limits  ACETAMINOPHEN  LEVEL - Abnormal; Notable for the following components:   Acetaminophen  (Tylenol ), Serum <10 (*)    All other components within normal limits  ETHANOL    EKG: None  Radiology: No results found.   Procedures   Medications Ordered in the ED  lidocaine  (LIDODERM ) 5 % 1 patch (1 patch Transdermal Not Given 12/23/23 1453)  OLANZapine  zydis (ZYPREXA ) disintegrating tablet 30 mg (has no administration in time range)  hydrOXYzine  (ATARAX ) tablet 25 mg (has no administration in time range)  risperiDONE  (RISPERDAL  M-TABS) disintegrating tablet 2 mg (has no administration in time range)    And  LORazepam  (ATIVAN ) tablet 1 mg (has no administration in time range)    And  ziprasidone  (GEODON ) injection 20 mg (has no administration in time range)  OLANZapine  zydis (ZYPREXA ) disintegrating tablet 5 mg (5 mg Oral Given 12/23/23 1450)                                     43 year old male with a history of paranoid schizophrenia, suicidal ideation, depression, polysubstance abuse, hypertension  who presents with concern that he has not been able to get his medications, reports back and foot pain.  He reports in triage he  has SI--did not discuss that during my initial evaluation. He was just discharged from [facility] and requested to come here, has had recent hospitalization at [facility] with discharge 6/19 with his medications. He has no history of suicide attempt.  He did report SI with plan to hang himself in triage, and when I ask him if he is feeling suicidal he reports yes with the plan to strangulate himself.  Regarding back pain--do not see evidence of red flags. Patient has a normal neurologic exam  and denies any urinary retention or overflow incontinence, stool incontinence, saddle anesthesia, fever, IV drug use, trauma, chronic steroid use or immunocompromise and have low suspicion suspicion for cauda equina, fracture, epidural abscess, or vertebral osteomyelitis.  Feet have some onychomycosis, small ulcer. No sign of bacterial infection. Normal pulses and capillary refill.   Given his suicidal ideation with a plan, will reconsult psychiatry to reevaluate him this morning.  Ordered labs for medical clearance.   Labs completed and evaluated by me without clinically significant findings. UDS positive for thc/cocaine.  He is medically cleared.  TTS consulted given SI with plan to hang himself.  He meets inpatient criteria and is now awaiting placement.    Final diagnoses:  Suicidal ideation    ED Discharge Orders     None          Dreama Longs, MD 12/23/23 1626

## 2023-12-23 NOTE — Progress Notes (Signed)
 [facility] LCSW Progress Note   12/23/2023    2:55 PM  Imir Brumbach   [id number]   Type of Contact and Topic:  Psychiatric Bed Placement   Pt accepted to [facility]   Patient meets inpatient criteria per Cathaleen Adam, PMHNP  The attending provider will be Dr. Delsa  Call report to [phone]    April Wilson, Paramedic @ [facility] ED notified.     Pt scheduled  [facility] @ 2000.    Maelani Yarbro, MSW, LCSW-A  2:56 PM 12/23/2023

## 2023-12-24 ENCOUNTER — Encounter (HOSPITAL_COMMUNITY): Payer: Self-pay

## 2023-12-24 ENCOUNTER — Inpatient Hospital Stay (HOSPITAL_COMMUNITY)
Admission: AD | Admit: 2023-12-24 | Discharge: 2024-01-02 | DRG: 885 | Disposition: A | Discharge status: Home / Self Care | Source: Intra-hospital | Attending: Psychiatry | Admitting: Psychiatry

## 2023-12-24 ENCOUNTER — Encounter (HOSPITAL_COMMUNITY): Payer: Self-pay | Admitting: Psychiatry

## 2023-12-24 DIAGNOSIS — Z5941 Food insecurity: Secondary | ICD-10-CM | POA: Diagnosis not present

## 2023-12-24 DIAGNOSIS — F1410 Cocaine abuse, uncomplicated: Secondary | ICD-10-CM | POA: Diagnosis present

## 2023-12-24 DIAGNOSIS — Z79899 Other long term (current) drug therapy: Secondary | ICD-10-CM

## 2023-12-24 DIAGNOSIS — I10 Essential (primary) hypertension: Secondary | ICD-10-CM | POA: Diagnosis present

## 2023-12-24 DIAGNOSIS — Z5900 Homelessness unspecified: Secondary | ICD-10-CM | POA: Diagnosis not present

## 2023-12-24 DIAGNOSIS — F329 Major depressive disorder, single episode, unspecified: Secondary | ICD-10-CM | POA: Diagnosis present

## 2023-12-24 DIAGNOSIS — Z5982 Transportation insecurity: Secondary | ICD-10-CM

## 2023-12-24 DIAGNOSIS — E559 Vitamin D deficiency, unspecified: Secondary | ICD-10-CM | POA: Diagnosis present

## 2023-12-24 DIAGNOSIS — Z560 Unemployment, unspecified: Secondary | ICD-10-CM

## 2023-12-24 DIAGNOSIS — Z9151 Personal history of suicidal behavior: Secondary | ICD-10-CM

## 2023-12-24 DIAGNOSIS — F17210 Nicotine dependence, cigarettes, uncomplicated: Secondary | ICD-10-CM | POA: Diagnosis present

## 2023-12-24 DIAGNOSIS — F1910 Other psychoactive substance abuse, uncomplicated: Secondary | ICD-10-CM | POA: Diagnosis not present

## 2023-12-24 DIAGNOSIS — F200 Paranoid schizophrenia: Secondary | ICD-10-CM | POA: Diagnosis present

## 2023-12-24 DIAGNOSIS — R45851 Suicidal ideations: Secondary | ICD-10-CM | POA: Diagnosis present

## 2023-12-24 DIAGNOSIS — Z87828 Personal history of other (healed) physical injury and trauma: Secondary | ICD-10-CM | POA: Diagnosis not present

## 2023-12-24 DIAGNOSIS — F1210 Cannabis abuse, uncomplicated: Secondary | ICD-10-CM | POA: Diagnosis present

## 2023-12-24 MED ORDER — DIPHENHYDRAMINE HCL 25 MG PO CAPS: 50.0000 mg | ORAL_CAPSULE | Freq: Three times a day (TID) | ORAL | Status: DC | PRN

## 2023-12-24 MED ORDER — HALOPERIDOL LACTATE 5 MG/ML IJ SOLN: 10.0000 mg | Freq: Three times a day (TID) | INTRAMUSCULAR | Status: DC | PRN

## 2023-12-24 MED ORDER — TRAZODONE HCL 50 MG PO TABS
50.0000 mg | ORAL_TABLET | Freq: Every evening | ORAL | Status: DC | PRN
  Administered 2023-12-29 – 2024-01-01 (×4): 50 mg via ORAL
  Filled 2023-12-24 (×5): qty 1

## 2023-12-24 MED ORDER — DIPHENHYDRAMINE HCL 50 MG/ML IJ SOLN: 50.0000 mg | Freq: Three times a day (TID) | INTRAMUSCULAR | Status: DC | PRN

## 2023-12-24 MED ORDER — HALOPERIDOL LACTATE 5 MG/ML IJ SOLN: 5.0000 mg | Freq: Three times a day (TID) | INTRAMUSCULAR | Status: DC | PRN

## 2023-12-24 MED ORDER — AMLODIPINE BESYLATE 10 MG PO TABS
10.0000 mg | ORAL_TABLET | Freq: Every day | ORAL | Status: DC
  Administered 2023-12-24 – 2024-01-02 (×10): 10 mg via ORAL
  Filled 2023-12-24: qty 2
  Filled 2023-12-24 (×9): qty 1

## 2023-12-24 MED ORDER — ALUM & MAG HYDROXIDE-SIMETH 200-200-20 MG/5ML PO SUSP: 30.0000 mL | ORAL | Status: DC | PRN

## 2023-12-24 MED ORDER — HALOPERIDOL 5 MG PO TABS: 5.0000 mg | ORAL_TABLET | Freq: Three times a day (TID) | ORAL | Status: DC | PRN

## 2023-12-24 MED ORDER — LORAZEPAM 2 MG/ML IJ SOLN: 2.0000 mg | Freq: Three times a day (TID) | INTRAMUSCULAR | Status: DC | PRN

## 2023-12-24 MED ORDER — MIRTAZAPINE 7.5 MG PO TABS
7.5000 mg | ORAL_TABLET | Freq: Every day | ORAL | Status: DC
  Administered 2023-12-24: 7.5 mg via ORAL
  Filled 2023-12-24: qty 1

## 2023-12-24 MED ORDER — MAGNESIUM HYDROXIDE 400 MG/5ML PO SUSP: 30.0000 mL | Freq: Every day | ORAL | Status: DC | PRN

## 2023-12-24 MED ORDER — HYDROXYZINE HCL 25 MG PO TABS
25.0000 mg | ORAL_TABLET | Freq: Three times a day (TID) | ORAL | Status: DC | PRN
  Administered 2023-12-25 – 2024-01-01 (×2): 25 mg via ORAL
  Filled 2023-12-24 (×2): qty 1

## 2023-12-24 MED ORDER — ACETAMINOPHEN 325 MG PO TABS
650.0000 mg | ORAL_TABLET | Freq: Four times a day (QID) | ORAL | Status: DC | PRN
  Administered 2023-12-24 – 2024-01-01 (×6): 650 mg via ORAL
  Filled 2023-12-24 (×6): qty 2

## 2023-12-24 MED ORDER — OLANZAPINE 10 MG PO TABS
30.0000 mg | ORAL_TABLET | Freq: Every day | ORAL | Status: DC
  Administered 2023-12-24 – 2023-12-25 (×2): 30 mg via ORAL
  Filled 2023-12-24 (×2): qty 3
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
## 2023-12-24 NOTE — Group Note (Signed)
 Recreation Therapy Group Note   Group Topic:Problem Solving  Group Date: 12/24/2023 Start Time: 0930 End Time: 1005 Facilitators: Devoiry Corriher-McCall, LRT,CTRS Location: 300 Hall Dayroom   Group Topic: Communication, Team Building, Problem Solving  Goal Area(s) Addresses:  Patient will effectively work with peer towards shared goal.  Patient will identify skills used to make activity successful.  Patient will share challenges and verbalize solution-driven approaches used. Patient will identify how skills used during activity can be used to reach post d/c goals.   Behavioral Response:   Intervention: STEM Activity   Activity: [employer]. Patients were provided the following materials: 5 drinking straws, 5 rubber bands, 5 paper clips, 2 index cards, and 2 drinking cups. Using the provided materials patients were asked to build a launching mechanism to launch a ping pong ball across the room, approximately 10 feet. Patients were divided into teams of 3-5. Instructions required all materials be incorporated into the device, functionality of items left to the peer group's discretion.  Education: Pharmacist, community, Scientist, physiological, Air cabin crew, Building control surveyor.   Education Outcome: Acknowledges education/In group clarification offered/Needs additional education.    Affect/Mood: N/A   Participation Level: Did not attend    Clinical Observations/Individualized Feedback:     Plan: Continue to engage patient in RT group sessions 2-3x/week.   Jamea Robicheaux-McCall, LRT,CTRS 12/24/2023 12:21 PM

## 2023-12-24 NOTE — Progress Notes (Signed)
   12/24/23 2300  Psych Admission Type (Psych Patients Only)  Admission Status Voluntary  Psychosocial Assessment  Patient Complaints Suspiciousness  Eye Contact Fair  Facial Expression Flat  Affect Anxious  Speech Soft  Interaction Guarded  Motor Activity Restless  Appearance/Hygiene In scrubs  Behavior Characteristics Anxious;Fidgety  Mood Anxious  Thought Process  Coherency WDL  Content Paranoia  Delusions Paranoid  Perception Hallucinations  Hallucination Auditory  Judgment Poor  Confusion None  Danger to Self  Current suicidal ideation? Denies  Agreement Not to Harm Self Yes  Description of Agreement verbal  Danger to Others  Danger to Others None reported or observed  Danger to Others Abnormal  Harmful Behavior to others No threats or harm toward other people  Destructive Behavior No threats or harm toward property

## 2023-12-24 NOTE — ED Notes (Signed)
 Pt left with [facility] [employer]
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
## 2023-12-24 NOTE — Group Note (Unsigned)
 Date:  12/24/2023 Time:  9:16 AM  Group Topic/Focus:  Goals Group:   The focus of this group is to help patients establish daily goals to achieve during treatment and discuss how the patient can incorporate goal setting into their daily lives to aide in recovery.     Participation Level:  {BHH PARTICIPATION OZCZO:77735}  Participation Quality:  {BHH PARTICIPATION QUALITY:22265}  Affect:  {BHH AFFECT:22266}  Cognitive:  {BHH COGNITIVE:22267}  Insight: {BHH Insight2:20797}  Engagement in Group:  {BHH ENGAGEMENT IN HMNLE:77731}  Modes of Intervention:  {BHH MODES OF INTERVENTION:22269}  Additional Comments:  ***  Cassius LOISE Dawn 12/24/2023, 9:16 AM

## 2023-12-24 NOTE — Plan of Care (Signed)
  Problem: Education: Goal: Knowledge of [facility] Education information/materials will improve Outcome: Progressing   Problem: Activity: Goal: Interest or engagement in activities will improve Outcome: Progressing Goal: Sleeping patterns will improve Outcome: Progressing   Problem: Coping: Goal: Ability to demonstrate self-control will improve Outcome: Progressing

## 2023-12-24 NOTE — B[facility] Group Notes (Signed)
 Spirituality Group   Group Goal: Support / Education around grief and loss    Group Description: Following introductions and group rules, group members engaged in facilitated group dialog and support around topic of loss, with particular support around experiences of loss in their lives. Group members identified types of loss (relationships / self / things) as well as patterns, circumstances, and changes that precipitate loss. Reflection invited on thoughts / feelings around loss, normalized grief responses, and recognized variety in grief experience. Group noted Worden's four tasks of grief in discussion. Group drew on Adlerian / Rogerian, narrative, MI, with Yalom's group therapy as a primary framework.   Observations: Aaron Rubio was thoughtful and engaged peers with his best efforts at meaning making, fostering connection. He was polite and well spoken.  Aaron Rubio L. Fredrica, M.Div [phone]

## 2023-12-24 NOTE — Plan of Care (Signed)
  Problem: Education: Goal: Knowledge of [facility] Education information/materials will improve Outcome: Progressing Goal: Mental status will improve Outcome: Progressing Goal: Verbalization of understanding the information provided will improve Outcome: Progressing   Problem: Activity: Goal: Interest or engagement in activities will improve Outcome: Progressing   Problem: Coping: Goal: Ability to verbalize frustrations and anger appropriately will improve Outcome: Progressing Goal: Ability to demonstrate self-control will improve Outcome: Progressing   Problem: Health Behavior/Discharge Planning: Goal: Compliance with treatment plan for underlying cause of condition will improve Outcome: Progressing   Problem: Safety: Goal: Periods of time without injury will increase Outcome: Progressing

## 2023-12-24 NOTE — ED Notes (Signed)
 Report was given to [name] at [facility], Safe transport has been called for pick up

## 2023-12-24 NOTE — Progress Notes (Signed)
 Patient ID: Aaron Rubio, male   DOB: 1981-03-20, 43 y.o.   MRN: [id number] Pt ambulated onto the unit with a steady gait. When asked why Pt admitted, Pt said; Realizing of being told the truth causing time to be depressing being homeless. Pt A/O to person and place with some intermittent confusion. Pt able to verbalize needs. Pt admitted voluntary. Pt has Hx. Schizophrenia/Depression/ Paranoia with Delusion. Medical Hx: HTN. Pt is admitted R/T non-med compliant. Allergies: NKDA. Meds whole. Food and Fluids offered with 100% meal intake. ADL's self. Skin assessed with no noted skin issues. Tattoo's present anterior chest, posterior back, and bilat. UE. Cont of B/B. LBM: 12/23/23. Mood: Hopeless. Pt denies SI/HI; A/V/H. Pt's goal is to stay compliant of meds and maintain housing. Pt denies pain. No noted discomfort. Monitor continues during 7p-7a.

## 2023-12-24 NOTE — BH IP Treatment Plan (Signed)
 Interdisciplinary Treatment and Diagnostic Plan Update  12/24/2023 Time of Session: 11:19AM Aaron Rubio MRN: [id number]  Principal Diagnosis: Paranoid schizophrenia [facility])  Secondary Diagnoses: Principal Problem:   Paranoid schizophrenia (HCC) Active Problems:   Polysubstance abuse (HCC)   MDD (major depressive disorder)   Current Medications:  Current Facility-Administered Medications  Medication Dose Route Frequency Provider Last Rate Last Admin   acetaminophen  (TYLENOL ) tablet 650 mg  650 mg Oral Q6H PRN Bobbitt, Shalon E, NP   650 mg at 12/24/23 1130   alum & mag hydroxide-simeth (MAALOX/MYLANTA) 200-200-20 MG/5ML suspension 30 mL  30 mL Oral Q4H PRN Bobbitt, Shalon E, NP       amLODipine  (NORVASC ) tablet 10 mg  10 mg Oral Daily Bobbitt, Shalon E, NP   10 mg at 12/24/23 1130   haloperidol  (HALDOL ) tablet 5 mg  5 mg Oral TID PRN Bobbitt, Shalon E, NP       And   diphenhydrAMINE  (BENADRYL ) capsule 50 mg  50 mg Oral TID PRN Bobbitt, Shalon E, NP       haloperidol  lactate (HALDOL ) injection 5 mg  5 mg Intramuscular TID PRN Bobbitt, Shalon E, NP       And   diphenhydrAMINE  (BENADRYL ) injection 50 mg  50 mg Intramuscular TID PRN Bobbitt, Shalon E, NP       And   LORazepam  (ATIVAN ) injection 2 mg  2 mg Intramuscular TID PRN Bobbitt, Shalon E, NP       haloperidol  lactate (HALDOL ) injection 10 mg  10 mg Intramuscular TID PRN Bobbitt, Shalon E, NP       And   diphenhydrAMINE  (BENADRYL ) injection 50 mg  50 mg Intramuscular TID PRN Bobbitt, Shalon E, NP       And   LORazepam  (ATIVAN ) injection 2 mg  2 mg Intramuscular TID PRN Bobbitt, Shalon E, NP       hydrOXYzine  (ATARAX ) tablet 25 mg  25 mg Oral TID PRN Bobbitt, Shalon E, NP       magnesium  hydroxide (MILK OF MAGNESIA) suspension 30 mL  30 mL Oral Daily PRN Bobbitt, Shalon E, NP       mirtazapine  (REMERON ) tablet 7.5 mg  7.5 mg Oral QHS Pashayan, Alexander S, DO       OLANZapine  (ZYPREXA ) tablet 30 mg  30 mg Oral QHS Bobbitt,  Shalon E, NP       traZODone  (DESYREL ) tablet 50 mg  50 mg Oral QHS PRN Bobbitt, Shalon E, NP       PTA Medications: Medications Prior to Admission  Medication Sig Dispense Refill Last Dose/Taking   amLODipine  (NORVASC ) 10 MG tablet Take 1 tablet (10 mg total) by mouth daily. (Patient not taking: Reported on 12/23/2023) 30 tablet 0    gabapentin  (NEURONTIN ) 300 MG capsule Take 300 mg by mouth 2 (two) times daily.      naproxen  (EC NAPROSYN ) 500 MG EC tablet Take 500 mg by mouth 2 (two) times daily with a meal.      OLANZapine  (ZYPREXA ) 15 MG tablet Take 2 tablets (30 mg total) by mouth at bedtime. (Patient not taking: Reported on 12/23/2023) 60 tablet 0    QUEtiapine  (SEROQUEL ) 200 MG tablet Take 200 mg by mouth 2 (two) times daily.       Patient Stressors:    Patient Strengths:    Treatment Modalities: Medication Management, Group therapy, Case management,  1 to 1 session with clinician, Psychoeducation, Recreational therapy.   Physician Treatment Plan for Primary Diagnosis: Paranoid schizophrenia (  HCC) Long Term Goal(s): Improvement in symptoms so as ready for discharge   Short Term Goals: Ability to identify changes in lifestyle to reduce recurrence of condition will improve Ability to disclose and discuss suicidal ideas Ability to demonstrate self-control will improve Ability to identify and develop effective coping behaviors will improve Ability to maintain clinical measurements within normal limits will improve Compliance with prescribed medications will improve Ability to identify triggers associated with substance abuse/mental health issues will improve  Medication Management: Evaluate patient's response, side effects, and tolerance of medication regimen.  Therapeutic Interventions: 1 to 1 sessions, Unit Group sessions and Medication administration.  Evaluation of Outcomes: Not Progressing  Physician Treatment Plan for Secondary Diagnosis: Principal Problem:   Paranoid  schizophrenia (HCC) Active Problems:   Polysubstance abuse (HCC)   MDD (major depressive disorder)  Long Term Goal(s): Improvement in symptoms so as ready for discharge   Short Term Goals: Ability to identify changes in lifestyle to reduce recurrence of condition will improve Ability to disclose and discuss suicidal ideas Ability to demonstrate self-control will improve Ability to identify and develop effective coping behaviors will improve Ability to maintain clinical measurements within normal limits will improve Compliance with prescribed medications will improve Ability to identify triggers associated with substance abuse/mental health issues will improve     Medication Management: Evaluate patient's response, side effects, and tolerance of medication regimen.  Therapeutic Interventions: 1 to 1 sessions, Unit Group sessions and Medication administration.  Evaluation of Outcomes: Not Progressing   RN Treatment Plan for Primary Diagnosis: Paranoid schizophrenia (HCC) Long Term Goal(s): Knowledge of disease and therapeutic regimen to maintain health will improve  Short Term Goals: Ability to verbalize frustration and anger appropriately will improve, Ability to demonstrate self-control, Ability to verbalize feelings will improve, Ability to identify and develop effective coping behaviors will improve, and Compliance with prescribed medications will improve  Medication Management: RN will administer medications as ordered by provider, will assess and evaluate patient's response and provide education to patient for prescribed medication. RN will report any adverse and/or side effects to prescribing provider.  Therapeutic Interventions: 1 on 1 counseling sessions, Psychoeducation, Medication administration, Evaluate responses to treatment, Monitor vital signs and CBGs as ordered, Perform/monitor CIWA, COWS, AIMS and Fall Risk screenings as ordered, Perform wound care treatments as  ordered.  Evaluation of Outcomes: Progressing   LCSW Treatment Plan for Primary Diagnosis: Paranoid schizophrenia (HCC) Long Term Goal(s): Safe transition to appropriate next level of care at discharge, Engage patient in therapeutic group addressing interpersonal concerns.  Short Term Goals: Engage patient in aftercare planning with referrals and resources, Increase ability to appropriately verbalize feelings, Increase emotional regulation, Facilitate acceptance of mental health diagnosis and concerns, and Identify triggers associated with mental health/substance abuse issues  Therapeutic Interventions: Assess for all discharge needs, 1 to 1 time with Social worker, Explore available resources and support systems, Assess for adequacy in community support network, Educate family and significant other(s) on suicide prevention, Complete Psychosocial Assessment, Interpersonal group therapy.  Evaluation of Outcomes: Progressing   Progress in Treatment: Attending groups: Yes. Participating in groups: Yes. Taking medication as prescribed: Yes. Toleration medication: Yes. Family/Significant other contact made: No, will contact:  declined consents Patient understands diagnosis: Yes. Discussing patient identified problems/goals with staff: Yes. Medical problems stabilized or resolved: Yes. Denies suicidal/homicidal ideation: Yes. Issues/concerns per patient self-inventory: No. Other: none  New problem(s) identified: No, Describe:  none  New Short Term/Long Term Goal(s): medication stabilization, elimination of SI thoughts, development of  comprehensive mental wellness plan.   Patient Goals:  Find acceptance with myself  Discharge Plan or Barriers: Patient recently admitted. CSW will continue to follow and assess for appropriate referrals and possible discharge planning.    Reason for Continuation of Hospitalization: Anxiety Delusions  Medication stabilization  Estimated Length of  Stay: 3-5 DAYS  Last 3 [place] Suicide Severity Risk Score: Flowsheet Row [facility] (Current[facility] from 12/23/2023 in [facility] Department at [facility] ED from 12/21/2023 in [facility]  C-SSRS RISK CATEGORY Error: Q7 should not be populated when Q6 is No High Risk Error: Q3, 4, or 5 should not be populated when Q2 is No    Last PHQ 2/9 Scores:     No data to display          Scribe for Treatment Team: Geselle Cardosa N Reed Dady, LCSW 12/24/2023 4:09 PM

## 2023-12-24 NOTE — Group Note (Signed)
 Date:  12/24/2023 Time:  9:30 PM  Group Topic/Focus:  Wrap-Up Group:   The focus of this group is to help patients review their daily goal of treatment and discuss progress on daily workbooks.    Additional Comments:  Pt was encouraged, but opted out of attending the AA meeting this evening.   Aaron Rubio 12/24/2023, 9:30 PM

## 2023-12-24 NOTE — Progress Notes (Signed)
   12/24/23 0200  Psychosocial Assessment  Patient Complaints Isolation  Eye Contact Fair  Facial Expression Flat  Affect Irritable  Speech Soft  Interaction Guarded  Motor Activity Other (Comment) (WNL)  Appearance/Hygiene In scrubs  Behavior Characteristics Irritable  Mood Irritable  Thought Process  Coherency WDL  Content WDL  Delusions None reported or observed  Perception WDL  Hallucination None reported or observed  Judgment Poor  Confusion None  Danger to Self  Current suicidal ideation?  (Denies)  Agreement Not to Harm Self Yes  Description of Agreement Notify Staff.  Danger to Others  Danger to Others None reported or observed

## 2023-12-24 NOTE — Group Note (Signed)
 Date:  12/24/2023 Time:  0830  Group Topic/Focus:  Goals Group:   The focus of this group is to help patients establish daily goals to achieve during treatment and discuss how the patient can incorporate goal setting into their daily lives to aide in recovery.    Participation Level:  Did Not Attend  Participation Quality:    Affect:    Cognitive:    Insight:   Engagement in Group:    Modes of Intervention:    Additional Comments:  Pt were aware of group time. Pt refused to attend group.  Cassius LOISE Dawn 12/24/2023, 11:59 AM

## 2023-12-24 NOTE — Progress Notes (Signed)
   12/24/23 0750  Psych Admission Type (Psych Patients Only)  Admission Status Voluntary  Psychosocial Assessment  Patient Complaints Isolation  Eye Contact Fair  Facial Expression Flat  Affect Anxious  Speech Soft  Interaction Guarded  Motor Activity Other (Comment) (No new findings)  Appearance/Hygiene In scrubs  Behavior Characteristics Anxious  Mood Anxious  Thought Process  Coherency WDL  Content WDL  Delusions None reported or observed  Perception WDL  Hallucination None reported or observed  Judgment Poor  Confusion None  Danger to Self  Current suicidal ideation? Denies  Agreement Not to Harm Self Yes  Description of Agreement verbal  Danger to Others  Danger to Others None reported or observed
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
## 2023-12-25 LAB — VITAMIN D 25 HYDROXY (VIT D DEFICIENCY, FRACTURES): Vit D, 25-Hydroxy: 28.71 ng/mL — ABNORMAL LOW (ref 30–100)

## 2023-12-25 MED ORDER — MIRTAZAPINE 15 MG PO TABS
15.0000 mg | ORAL_TABLET | Freq: Every day | ORAL | Status: DC
  Administered 2023-12-25 – 2024-01-01 (×8): 15 mg via ORAL
  Filled 2023-12-25 (×8): qty 1

## 2023-12-25 MED ORDER — VITAMIN D (ERGOCALCIFEROL) 1.25 MG (50000 UNIT) PO CAPS
50000.0000 [IU] | ORAL_CAPSULE | Freq: Every day | ORAL | Status: DC
  Administered 2023-12-25 – 2024-01-01 (×4): 50000 [IU] via ORAL
  Filled 2023-12-25 (×9): qty 1

## 2023-12-25 MED ORDER — HALOPERIDOL 5 MG PO TABS
5.0000 mg | ORAL_TABLET | Freq: Two times a day (BID) | ORAL | Status: DC
  Administered 2023-12-25 – 2023-12-26 (×3): 5 mg via ORAL
  Filled 2023-12-25 (×4): qty 1

## 2023-12-25 NOTE — Group Note (Signed)
 LCSW Group Therapy Note   Group Date: 12/25/2023 Start Time: 1100 End Time: 1200   Participation:  did not attend  Type of Therapy:  Group Therapy  Topic:  Shining from Within:  Confidence and Self-Row Journey  Objective:  to support participants in developing confidence and self-[place] through self-awareness, self-compassion, and practical skills that nurture personal growth.   Group Goals Encourage self-reflection and self-acceptance by identifying personal strengths and achievements. Teach skills to challenge negative self-talk and replace it with supportive, truthful self-talk. Foster resilience and self-worth through [employer], gratitude, and self-care practices.   Summary:  this group explores the connection between confidence and self-[place] by guiding participants through reflection, mindset shifts, and practical tools like affirmations, strength recognition, and goal-setting. Activities are designed to promote self-compassion, build emotional resilience, and normalize the slow, patient journey of inner growth.   Therapeutic Modalities Used Cognitive Behavioral Therapy (CBT): Challenging and reframing unhelpful self-talk Motivational Interviewing (MI): Encouraging small, achievable goals   Janeisha Ryle O Easton Sivertson, LCSWA 12/25/2023  7:00 PM

## 2023-12-25 NOTE — Group Note (Signed)
 Recreation Therapy Group Note   Group Topic:Animal Assisted Therapy   Group Date: 12/25/2023 Start Time: 9053 End Time: 1030 Facilitators: Delwyn Scoggin-McCall, LRT,CTRS Location: [address] Dayroom   Animal-Assisted Activity (AAA) Program Checklist/Progress Notes Patient Eligibility Criteria Checklist & Daily Group note for Rec Tx Intervention  AAA/T Program Assumption of Risk Form signed by Patient/ or Parent Legal Guardian Yes  Patient understands his/her participation is voluntary Yes  Behavioral Response:    Education: Charity fundraiser, Appropriate Animal Interaction   Education Outcome: Acknowledges education.    Affect/Mood: N/A   Participation Level: Did not attend    Clinical Observations/Individualized Feedback:    Plan: Continue to engage patient in RT group sessions 2-3x/week.   Jylan Loeza-McCall, LRT,CTRS 12/25/2023 1:20 PM

## 2023-12-25 NOTE — Progress Notes (Signed)
 [place] did not attend wrap up group.

## 2023-12-25 NOTE — Progress Notes (Signed)
   12/25/23 0800  Psych Admission Type (Psych Patients Only)  Admission Status Voluntary  Psychosocial Assessment  Patient Complaints Suspiciousness  Eye Contact Fair  Facial Expression Flat  Affect Anxious  Speech Soft  Interaction Guarded  Motor Activity Restless  Appearance/Hygiene In scrubs  Behavior Characteristics Cooperative;Appropriate to situation  Mood Anxious  Thought Process  Coherency WDL  Content Paranoia  Delusions Paranoid  Perception Hallucinations  Hallucination Auditory  Judgment Poor  Confusion None  Danger to Self  Current suicidal ideation? Denies  Agreement Not to Harm Self Yes  Description of Agreement Verbal  Danger to Others  Danger to Others None reported or observed  Danger to Others Abnormal  Harmful Behavior to others No threats or harm toward other people  Destructive Behavior No threats or harm toward property

## 2023-12-25 NOTE — Progress Notes (Signed)
   12/25/23 0530  15 Minute Checks  Location Bedroom  Visual Appearance Calm  Behavior Sleeping  OTHER  Documented sleep last 24 hours 6  Sleep (Behavioral Health Patients Only)  Calculate sleep? (Click Yes once per 24 hr at 0600 safety check) Yes

## 2023-12-25 NOTE — Plan of Care (Signed)
   Problem: Activity: Goal: Interest or engagement in activities will improve Outcome: Progressing   Problem: Coping: Goal: Ability to verbalize frustrations and anger appropriately will improve Outcome: Progressing   Problem: Safety: Goal: Periods of time without injury will increase Outcome: Progressing

## 2023-12-25 NOTE — Plan of Care (Signed)
  Problem: Education: Goal: Mental status will improve Outcome: Not Progressing Goal: Verbalization of understanding the information provided will improve Outcome: Not Progressing   

## 2023-12-25 NOTE — Progress Notes (Cosign Needed)
 Aaron Rubio Progress Note  12/25/2023 12:22 PM Aaron Rubio  MRN:  [id number] Subjective:   Aaron HERO. [name] is a 43 yr old male who presented on 6/22 to [facility] reporting SI and with bizarre behavior, he was admitted to [facility] on 6/23. PPHx is significant for Paranoid Schizophrenia, Depression, Polysubstance Abuse (Cocaine, THC), and Medication Non-Compliance, and 1 Suicide Attempt (wrapped towel around neck 2008) and Multiple Prior Psychiatric Hospitalizations (last- [facility] 12/15/2023), and no history of Self Injurious Behavior.    Case was discussed in the multidisciplinary team. MAR was reviewed and patient was compliant with medications.  He received PRN Tylenol  yesterday.   Psychiatric Team made the following recommendations yesterday: -Continue Zyprexa  30 mg QHS for psychosis -Start Remeron  7.5 mg QHS for depression and sleep    On interview today patient reports he slept fair last night.  He reports his appetite is doing fair.  He reports he continues to have SI but does contract for safety.  He reports no HI or VH.  He reports continuing to have AH- voices.  He endorses paranoia by reporting that others on the unit have been talking about him.  He reports no issues with his medications.  Discussed with him that since he was continuing to have significant symptoms we would start Haldol  and he was agreeable to this.  He reports no other concerns at present.   Principal Problem: Paranoid schizophrenia (HCC) Diagnosis: Principal Problem:   Paranoid schizophrenia (HCC) Active Problems:   Polysubstance abuse (HCC)   MDD (major depressive disorder)  Total Time spent with patient:  I personally spent 35 minutes on the unit in direct patient care. The direct patient care time included face-to-face time with the patient, reviewing the patient's chart, communicating with other professionals, and coordinating care. Greater than 50% of this time was spent in counseling or coordinating care with the  patient regarding goals of hospitalization, psycho-education, and discharge planning needs.   Past Psychiatric History:  Paranoid Schizophrenia, Depression, Polysubstance Abuse (Cocaine, THC), and Medication Non-Compliance, and 1 Suicide Attempt (wrapped towel around neck 2008) and Multiple Prior Psychiatric Hospitalizations (last- [facility] 12/15/2023), and no history of Self Injurious Behavior.   Past Medical History:  Past Medical History:  Diagnosis Date   Hypertension    Schizophrenia (HCC)    History reviewed. No pertinent surgical history. Family History: History reviewed. No pertinent family history. Family Psychiatric  History:  Reports No Known Diagnosis', Substance Abuse, or Suicides   Social History:  Social History   Substance and Sexual Activity  Alcohol Use Yes     Social History   Substance and Sexual Activity  Drug Use Yes   Types: Cocaine, Marijuana    Social History   Socioeconomic History   Marital status: Single    Spouse name: Not on file   Number of children: Not on file   Years of education: Not on file   Highest education level: Not on file  Occupational History   Not on file  Tobacco Use   Smoking status: Every Day    Current packs/day: 0.30    Types: Cigarettes   Smokeless tobacco: Not on file  Vaping Use   Vaping status: Never Used  Substance and Sexual Activity   Alcohol use: Yes   Drug use: Yes    Types: Cocaine, Marijuana   Sexual activity: Not on file  Other Topics Concern   Not on file  Social History Narrative   Not on file   Social  Drivers of Corporate investment banker Strain: Not on file  Food Insecurity: Food Insecurity Present (12/24/2023)   Hunger Vital Sign    Worried About Running Out of Food in the Last Year: Sometimes true    Ran Out of Food in the Last Year: Sometimes true  Transportation Needs: Unmet Transportation Needs (12/24/2023)   PRAPARE - Administrator, Civil Service (Medical): Yes    Lack of  Transportation (Non-Medical): Yes  Physical Activity: Not on file  Stress: Not on file  Social Connections: Unknown (05/08/2022)   Received from [employer]   Social Network    Social Network: Not on file   Additional Social History:                         Sleep: Fair Estimated Sleeping Duration (Last 24 Hours): 5.50 hours  Appetite:  Fair  Current Medications: Current Facility-Administered Medications  Medication Dose Route Frequency Provider Last Rate Last Admin   acetaminophen  (TYLENOL ) tablet 650 mg  650 mg Oral Q6H PRN Bobbitt, Shalon E, NP   650 mg at 12/24/23 1130   alum & mag hydroxide-simeth (MAALOX/MYLANTA) 200-200-20 MG/5ML suspension 30 mL  30 mL Oral Q4H PRN Bobbitt, Shalon E, NP       amLODipine  (NORVASC ) tablet 10 mg  10 mg Oral Daily Bobbitt, Shalon E, NP   10 mg at 12/25/23 0910   haloperidol  (HALDOL ) tablet 5 mg  5 mg Oral TID PRN Bobbitt, Shalon E, NP       And   diphenhydrAMINE  (BENADRYL ) capsule 50 mg  50 mg Oral TID PRN Bobbitt, Shalon E, NP       haloperidol  lactate (HALDOL ) injection 5 mg  5 mg Intramuscular TID PRN Bobbitt, Shalon E, NP       And   diphenhydrAMINE  (BENADRYL ) injection 50 mg  50 mg Intramuscular TID PRN Bobbitt, Shalon E, NP       And   LORazepam  (ATIVAN ) injection 2 mg  2 mg Intramuscular TID PRN Bobbitt, Shalon E, NP       haloperidol  lactate (HALDOL ) injection 10 mg  10 mg Intramuscular TID PRN Bobbitt, Shalon E, NP       And   diphenhydrAMINE  (BENADRYL ) injection 50 mg  50 mg Intramuscular TID PRN Bobbitt, Shalon E, NP       And   LORazepam  (ATIVAN ) injection 2 mg  2 mg Intramuscular TID PRN Bobbitt, Shalon E, NP       haloperidol  (HALDOL ) tablet 5 mg  5 mg Oral BID Eudelia Hiltunen S, DO   5 mg at 12/25/23 1205   hydrOXYzine  (ATARAX ) tablet 25 mg  25 mg Oral TID PRN Bobbitt, Shalon E, NP   25 mg at 12/25/23 0910   magnesium  hydroxide (MILK OF MAGNESIA) suspension 30 mL  30 mL Oral Daily PRN Bobbitt, Shalon E, NP        mirtazapine  (REMERON ) tablet 15 mg  15 mg Oral QHS Macy Polio S, DO       OLANZapine  (ZYPREXA ) tablet 30 mg  30 mg Oral QHS Bobbitt, Shalon E, NP   30 mg at 12/24/23 2141   traZODone  (DESYREL ) tablet 50 mg  50 mg Oral QHS PRN Bobbitt, Shalon E, NP        Lab Results:  Results for orders placed or performed during the hospital encounter of 12/24/23 (from the past 48 hours)  VITAMIN D 25 Hydroxy (Vit-D Deficiency, Fractures)  Status: Abnormal   Collection Time: 12/25/23  6:26 AM  Result Value Ref Range   Vit D, 25-Hydroxy 28.71 (L) 30 - 100 ng/mL    Comment: (NOTE) Vitamin D deficiency has been defined by the Institute of Medicine  and an Endocrine Society practice guideline as a level of serum 25-OH  vitamin D less than 20 ng/mL (1,2). The Endocrine Society went on to  further define vitamin D insufficiency as a level between 21 and 29  ng/mL (2).  1. IOM (Institute of Medicine). 2010. Dietary reference intakes for  calcium and D. [place]: [employer]. 2. Holick MF, Binkley [place], Bischoff-Ferrari HA, et al. Evaluation,  treatment, and prevention of vitamin D deficiency: an Endocrine  Society clinical practice guideline, JCEM. 2011 Jul; 96(7): 1911-30.  Performed at [facility], [address], [place], [place] [address]     Blood Alcohol level:  Lab Results  Component Value Date   [facility] <15 12/23/2023   ETH <15 12/14/2023    Metabolic Disorder Labs: Lab Results  Component Value Date   HGBA1C 5.0 10/27/2023   MPG 96.8 10/27/2023   No results found for: PROLACTIN Lab Results  Component Value Date   CHOL 152 10/19/2023   TRIG 144 10/19/2023   HDL 57 10/19/2023   CHOLHDL 2.7 10/19/2023   VLDL 29 10/19/2023   LDLCALC 66 10/19/2023    Physical Findings: AIMS:  ,  ,  ,  ,  ,  ,   CIWA:    COWS:     Musculoskeletal: Strength & Muscle Tone: within normal limits Gait & Station: normal Patient leans: N/A  Psychiatric Specialty  Exam:  Presentation  General Appearance:  Casual  Eye Contact: Fair  Speech: -- (mix of coherent and garbled)  Speech Volume: Normal  Handedness: Right   Mood and Affect  Mood: Dysphoric  Affect: Constricted   Thought Process  Thought Processes: Coherent  Descriptions of Associations:Intact  Orientation:Full (Time, Place and Person)  Thought Content:Delusions  History of Schizophrenia/Schizoaffective disorder:Yes  Duration of Psychotic Symptoms:Greater than six months  Hallucinations:Hallucinations: Auditory Description of Auditory Hallucinations: people talking Description of Visual Hallucinations: glare coming off of things  Ideas of Reference:Paranoia  Suicidal Thoughts:Suicidal Thoughts: Yes, Passive (contracts for safety)  Homicidal Thoughts:Homicidal Thoughts: No   Sensorium  Memory: Immediate Fair  Judgment: Intact  Insight: Present   Executive Functions  Concentration: Fair  Attention Span: Fair  Recall: Fair  Fund of Knowledge: Fair  Language: Fair   Psychomotor Activity  Psychomotor Activity: Psychomotor Activity: Normal   Assets  Assets: Desire for Improvement; Resilience   Sleep  Sleep: Sleep: Fair    Physical Exam: Physical Exam Vitals and nursing note reviewed.  Constitutional:      General: He is not in acute distress.    Appearance: Normal appearance. He is normal weight. He is not ill-appearing or toxic-appearing.  HENT:     Head: Normocephalic and atraumatic.  Pulmonary:     Effort: Pulmonary effort is normal.   Musculoskeletal:        General: Normal range of motion.   Neurological:     General: No focal deficit present.     Mental Status: He is alert.    Review of Systems  Respiratory:  Negative for cough and shortness of breath.   Cardiovascular:  Negative for chest pain.  Gastrointestinal:  Negative for abdominal pain, constipation, diarrhea, nausea and vomiting.  Neurological:   Negative for dizziness, weakness and  headaches.  Psychiatric/Behavioral:  Positive for depression, hallucinations (AH) and suicidal ideas Midwife). The patient is not nervous/anxious.    Blood pressure (!) 143/103, pulse 64, temperature 98 F (36.7 C), temperature source Oral, resp. rate 18, height 6' 2 (1.88 m), weight 104.8 kg, SpO2 99%. Body mass index is 29.66 kg/m.   Treatment Plan Summary: Daily contact with patient to assess and evaluate symptoms and progress in treatment and Medication management   Dametri M. Tripoli is a 43 yr old male who presented on 6/22 to [facility] reporting SI and with bizarre behavior, he was admitted to [facility] on 6/23.  PPHx is significant for Paranoid Schizophrenia, Depression, Polysubstance Abuse (Cocaine, THC), and Medication Non-Compliance, and 1 Suicide Attempt (wrapped towel around neck 2008) and Multiple Prior Psychiatric Hospitalizations (last- [facility] 12/15/2023), and no history of Self Injurious Behavior.     Chayim is showing some improvement as his speech was clearer today, however, he continues to have paranoia and significant AH.  We will start Haldol  to address these symptoms and this would provide an LAI option given his history of noncompliance.  We will further increase his Remeron  as he tolerated starting it last night.  We will not make any other changes to his medications at this time.  We will continue to monitor.       Paranoid Schizophrenia: -Continue Zyprexa  30 mg QHS for psychosis -Start Haldol  5 mg BID for psychosis -Increase Remeron  to 15 mg QHS for depression and sleep -Continue Agitation Protocol: Haldol /Ativan /Benadryl      -Continue Amlodipine  10 mg daily -Continue PRN's: Tylenol , Maalox, Atarax , Milk of Magnesia, Trazodone    --  The risks/benefits/side-effects/alternatives to medications were discussed in detail with the patient and time was given for questions. The patient consents to medication trials.                --  Metabolic profile and EKG monitoring obtained while on an atypical antipsychotic (CMP: WNL except  BUN: 22, Ca: 8.6, Tot Prot: 6.3,  CBC: WNL except Hem: 12.3, Eosin Abs: 0.9, UDS: Cocaine, THC Positive,  Lipid Panel (4/18): WNL, A1c(4/26): 5.0, TSH(4/26): 0.962, EKG: NSR w/ Sinus Arrhythmia w/ Qtc: 448)              -- Encouraged patient to participate in unit milieu and in scheduled group therapies              -- Short Term Goals: Ability to identify changes in lifestyle to reduce recurrence of condition will improve, Ability to verbalize feelings will improve, Ability to disclose and discuss suicidal ideas, Ability to demonstrate self-control will improve, Ability to identify and develop effective coping behaviors will improve, Ability to maintain clinical measurements within normal limits will improve, Compliance with prescribed medications will improve, and Ability to identify triggers associated with substance abuse/mental health issues will improve             -- Long Term Goals: Improvement in symptoms so as ready for discharge   Safety and Monitoring:             -- Voluntary admission to inpatient psychiatric unit for safety, stabilization and treatment             -- Daily contact with patient to assess and evaluate symptoms and progress in treatment             -- Patient's case to be discussed in multi-disciplinary team meeting             --  Observation Level : q15 minute checks             -- Vital signs:  q12 hours             -- Precautions: suicide, elopement, and assault  Discharge Planning:              -- Social work and case management to assist with discharge planning and identification of hospital follow-up needs prior to discharge             -- Estimated LOS: 3-5 more days             -- Discharge Concerns: Need to establish a safety plan; Medication compliance and effectiveness             -- Discharge Goals: Return home with outpatient referrals for mental health  follow-up including medication management/psychotherapy=   Marsa GORMAN Rosser, DO 12/25/2023, 12:22 PM

## 2023-12-25 NOTE — Group Note (Addendum)
 Date:  12/25/2023 Time:  11:46 AM  Group Topic/Focus:  Goals Group:   The focus of this group is to help patients establish daily goals to achieve during treatment and discuss how the patient can incorporate goal setting into their daily lives to aide in recovery.    Participation Level:  Did Not Attend  Participation Quality:  na  Affect:  na  Cognitive:  na  Insight: na  Engagement in Group:  na  Modes of Intervention:  na  Additional Comments:  Pt did not attend group  Denece Collet 12/25/2023, 11:46 AM

## 2023-12-26 MED ORDER — OLANZAPINE 10 MG PO TABS
20.0000 mg | ORAL_TABLET | Freq: Every day | ORAL | Status: DC
  Administered 2023-12-26 – 2023-12-27 (×2): 20 mg via ORAL
  Filled 2023-12-26 (×2): qty 2

## 2023-12-26 MED ORDER — HALOPERIDOL 5 MG PO TABS
10.0000 mg | ORAL_TABLET | Freq: Two times a day (BID) | ORAL | Status: DC
  Administered 2023-12-26 – 2023-12-28 (×4): 10 mg via ORAL
  Filled 2023-12-26 (×4): qty 2

## 2023-12-26 NOTE — Progress Notes (Signed)
 [facility] MD Progress Note  12/26/2023 12:30 PM Aaron Rubio  MRN:  [id number] Subjective:   Sergio HERO. Utsey is a 43 yr old male who presented on 6/22 to [facility] reporting SI and with bizarre behavior, he was admitted to [facility] on 6/23. PPHx is significant for Paranoid Schizophrenia, Depression, Polysubstance Abuse (Cocaine, THC), and Medication Non-Compliance, and 1 Suicide Attempt (wrapped towel around neck 2008) and Multiple Prior Psychiatric Hospitalizations (last- [facility] 12/15/2023), and no history of Rubio Injurious Behavior.    Case was discussed in the multidisciplinary team. MAR was reviewed and patient was compliant with medications.  He received PRN Hydroxyzine  yesterday.   Psychiatric Team made the following recommendations yesterday: -Continue Zyprexa  30 mg QHS for psychosis -Start Haldol  5 mg BID for psychosis -Increase Remeron  to 15 mg QHS for depression and sleep   On interview today patient reports he slept fair last night.  He reports his appetite is doing good.  He reports he still has SI but contracts for safety.  He reports no HI or VH.  He reports he continues to have AH- voices  He reports no issues with his medications.  Discussed with him that we would plan to further increase his Haldol  and decrease his Zyprexa  and he was agreeable with this.  He reports he is having congestion and nasal drip.  He reports no other concerns at present.    Principal Problem: Paranoid schizophrenia (HCC) Diagnosis: Principal Problem:   Paranoid schizophrenia (HCC) Active Problems:   Polysubstance abuse (HCC)   MDD (major depressive disorder)  Total Time spent with patient:  I personally spent 35 minutes on the unit in direct patient care. The direct patient care time included face-to-face time with the patient, reviewing the patient's chart, communicating with other professionals, and coordinating care. Greater than 50% of this time was spent in counseling or coordinating care with the  patient regarding goals of hospitalization, psycho-education, and discharge planning needs.   Past Psychiatric History:  Paranoid Schizophrenia, Depression, Polysubstance Abuse (Cocaine, THC), and Medication Non-Compliance, and 1 Suicide Attempt (wrapped towel around neck 2008) and Multiple Prior Psychiatric Hospitalizations (last- [facility] 12/15/2023), and no history of Rubio Injurious Behavior.   Past Medical History:  Past Medical History:  Diagnosis Date   Hypertension    Schizophrenia (HCC)    History reviewed. No pertinent surgical history. Family History: History reviewed. No pertinent family history. Family Psychiatric  History:  Reports No Known Diagnosis', Substance Abuse, or Suicides   Social History:  Social History   Substance and Sexual Activity  Alcohol Use Yes     Social History   Substance and Sexual Activity  Drug Use Yes   Types: Cocaine, Marijuana    Social History   Socioeconomic History   Marital status: Single    Spouse name: Not on file   Number of children: Not on file   Years of education: Not on file   Highest education level: Not on file  Occupational History   Not on file  Tobacco Use   Smoking status: Every Day    Current packs/day: 0.30    Types: Cigarettes   Smokeless tobacco: Not on file  Vaping Use   Vaping status: Never Used  Substance and Sexual Activity   Alcohol use: Yes   Drug use: Yes    Types: Cocaine, Marijuana   Sexual activity: Not on file  Other Topics Concern   Not on file  Social History Narrative   Not on file  Social Drivers of Corporate investment banker Strain: Not on file  Food Insecurity: Food Insecurity Present (12/24/2023)   Hunger Vital Sign    Worried About Running Out of Food in the Last Year: Sometimes true    Ran Out of Food in the Last Year: Sometimes true  Transportation Needs: Unmet Transportation Needs (12/24/2023)   PRAPARE - Administrator, Civil Service (Medical): Yes    Lack of  Transportation (Non-Medical): Yes  Physical Activity: Not on file  Stress: Not on file  Social Connections: Unknown (05/08/2022)   Received from [employer]   Social Network    Social Network: Not on file   Additional Social History:                         Sleep: Fair Estimated Sleeping Duration (Last 24 Hours): 7.50-7.75 hours  Appetite:  Fair  Current Medications: Current Facility-Administered Medications  Medication Dose Route Frequency Provider Last Rate Last Admin   acetaminophen  (TYLENOL ) tablet 650 mg  650 mg Oral Q6H PRN Bobbitt, Shalon E, NP   650 mg at 12/26/23 1049   alum & mag hydroxide-simeth (MAALOX/MYLANTA) 200-200-20 MG/5ML suspension 30 mL  30 mL Oral Q4H PRN Bobbitt, Shalon E, NP       amLODipine  (NORVASC ) tablet 10 mg  10 mg Oral Daily Bobbitt, Shalon E, NP   10 mg at 12/26/23 1016   haloperidol  (HALDOL ) tablet 5 mg  5 mg Oral TID PRN Bobbitt, Shalon E, NP       And   diphenhydrAMINE  (BENADRYL ) capsule 50 mg  50 mg Oral TID PRN Bobbitt, Shalon E, NP       haloperidol  lactate (HALDOL ) injection 5 mg  5 mg Intramuscular TID PRN Bobbitt, Shalon E, NP       And   diphenhydrAMINE  (BENADRYL ) injection 50 mg  50 mg Intramuscular TID PRN Bobbitt, Shalon E, NP       And   LORazepam  (ATIVAN ) injection 2 mg  2 mg Intramuscular TID PRN Bobbitt, Shalon E, NP       haloperidol  lactate (HALDOL ) injection 10 mg  10 mg Intramuscular TID PRN Bobbitt, Shalon E, NP       And   diphenhydrAMINE  (BENADRYL ) injection 50 mg  50 mg Intramuscular TID PRN Bobbitt, Shalon E, NP       And   LORazepam  (ATIVAN ) injection 2 mg  2 mg Intramuscular TID PRN Bobbitt, Shalon E, NP       haloperidol  (HALDOL ) tablet 10 mg  10 mg Oral BID Jakorey Mcconathy S, DO       hydrOXYzine  (ATARAX ) tablet 25 mg  25 mg Oral TID PRN Bobbitt, Shalon E, NP   25 mg at 12/25/23 0910   magnesium  hydroxide (MILK OF MAGNESIA) suspension 30 mL  30 mL Oral Daily PRN Bobbitt, Shalon E, NP       mirtazapine   (REMERON ) tablet 15 mg  15 mg Oral QHS Ehsan Corvin S, DO   15 mg at 12/25/23 2137   OLANZapine  (ZYPREXA ) tablet 20 mg  20 mg Oral QHS Kenzi Bardwell S, DO       traZODone  (DESYREL ) tablet 50 mg  50 mg Oral QHS PRN Bobbitt, Shalon E, NP       Vitamin D (Ergocalciferol) (DRISDOL) 1.25 MG (50000 UNIT) capsule 50,000 Units  50,000 Units Oral Daily Parker, Alvin S, MD   50,000 Units at 12/26/23 1015    Lab Results:  Results for orders placed or performed during the hospital encounter of 12/24/23 (from the past 48 hours)  VITAMIN D 25 Hydroxy (Vit-D Deficiency, Fractures)     Status: Abnormal   Collection Time: 12/25/23  6:26 AM  Result Value Ref Range   Vit D, 25-Hydroxy 28.71 (L) 30 - 100 ng/mL    Comment: (NOTE) Vitamin D deficiency has been defined by the Institute of Medicine  and an Endocrine Society practice guideline as a level of serum 25-OH  vitamin D less than 20 ng/mL (1,2). The Endocrine Society went on to  further define vitamin D insufficiency as a level between 21 and 29  ng/mL (2).  1. IOM (Institute of Medicine). 2010. Dietary reference intakes for  calcium and D. [place]: [employer]. 2. Holick MF, Binkley [place], Bischoff-Ferrari HA, et al. Evaluation,  treatment, and prevention of vitamin D deficiency: an Endocrine  Society clinical practice guideline, JCEM. 2011 Jul; 96(7): 1911-30.  Performed at [facility], [address], [place], [place] [address]     Blood Alcohol level:  Lab Results  Component Value Date   [facility] <15 12/23/2023   ETH <15 12/14/2023    Metabolic Disorder Labs: Lab Results  Component Value Date   HGBA1C 5.0 10/27/2023   MPG 96.8 10/27/2023   No results found for: PROLACTIN Lab Results  Component Value Date   CHOL 152 10/19/2023   TRIG 144 10/19/2023   HDL 57 10/19/2023   CHOLHDL 2.7 10/19/2023   VLDL 29 10/19/2023   LDLCALC 66 10/19/2023    Physical Findings: AIMS:  ,  ,  ,  ,  ,  ,    CIWA:    COWS:     Musculoskeletal: Strength & Muscle Tone: within normal limits Gait & Station: normal Patient leans: N/A  Psychiatric Specialty Exam:  Presentation  General Appearance:  Appropriate for Environment; Casual  Eye Contact: Fair  Speech: -- (mix of coherent and garbled)  Speech Volume: Normal  Handedness: Right   Mood and Affect  Mood: Dysphoric  Affect: Constricted   Thought Process  Thought Processes: Linear  Descriptions of Associations:Intact  Orientation:Full (Time, Place and Person)  Thought Content:Delusions  History of Schizophrenia/Schizoaffective disorder:Yes  Duration of Psychotic Symptoms:Greater than six months  Hallucinations:Hallucinations: Auditory Description of Auditory Hallucinations: people talking  Ideas of Reference:Paranoia  Suicidal Thoughts:Suicidal Thoughts: Yes, Passive (contracts for safety)  Homicidal Thoughts:Homicidal Thoughts: No   Sensorium  Memory: Immediate Fair  Judgment: Intact  Insight: Present   Executive Functions  Concentration: Fair  Attention Span: Fair  Recall: [employer] of Knowledge: Fair  Language: Fair   Psychomotor Activity  Psychomotor Activity: Psychomotor Activity: Normal   Assets  Assets: Resilience   Sleep  Sleep: Sleep: Fair    Physical Exam: Physical Exam Vitals and nursing note reviewed.  Constitutional:      General: He is not in acute distress.    Appearance: Normal appearance. He is normal weight. He is not ill-appearing or toxic-appearing.  HENT:     Head: Normocephalic and atraumatic.  Pulmonary:     Effort: Pulmonary effort is normal.   Musculoskeletal:        General: Normal range of motion.   Neurological:     General: No focal deficit present.     Mental Status: He is alert.    Review of Systems  HENT:  Positive for congestion.   Respiratory:  Negative for cough and shortness of breath.  Cardiovascular:   Negative for chest pain.  Gastrointestinal:  Negative for abdominal pain, constipation, diarrhea, nausea and vomiting.  Neurological:  Negative for dizziness, weakness and headaches.  Psychiatric/Behavioral:  Positive for depression, hallucinations (AH) and suicidal ideas Air traffic controller for safety). The patient is not nervous/anxious.    Blood pressure (!) 123/96, pulse 95, temperature 99.1 F (37.3 C), temperature source Oral, resp. rate 18, height 6' 2 (1.88 m), weight 104.8 kg, SpO2 95%. Body mass index is 29.66 kg/m.   Treatment Plan Summary: Daily contact with patient to assess and evaluate symptoms and progress in treatment and Medication management   Kendrell M. Droessler is a 43 yr old male who presented on 6/22 to [facility] reporting SI and with bizarre behavior, he was admitted to [facility] on 6/23.  PPHx is significant for Paranoid Schizophrenia, Depression, Polysubstance Abuse (Cocaine, THC), and Medication Non-Compliance, and 1 Suicide Attempt (wrapped towel around neck 2008) and Multiple Prior Psychiatric Hospitalizations (last- [facility] 12/15/2023), and no history of Rubio Injurious Behavior.     Charli is continuing to have paranoia and AH.  To address this we will increase his Haldol  and reduce daytime sedation we will decrease his Zyprexa .  We will not make any other changes to his medications at this time.  We will continue to monitor.      Paranoid Schizophrenia: -Decrease Zyprexa  to 20 mg QHS for psychosis -Increase Haldol  to 10 mg BID for psychosis -Continue Remeron  15 mg QHS for depression and sleep -Continue Agitation Protocol: Haldol /Ativan /Benadryl      -Continue Amlodipine  10 mg daily -Continue PRN's: Tylenol , Maalox, Atarax , Milk of Magnesia, Trazodone    --  The risks/benefits/side-effects/alternatives to medications were discussed in detail with the patient and time was given for questions. The patient consents to medication trials.                -- Metabolic profile and EKG  monitoring obtained while on an atypical antipsychotic (CMP: WNL except  BUN: 22, Ca: 8.6, Tot Prot: 6.3,  CBC: WNL except Hem: 12.3, Eosin Abs: 0.9, UDS: Cocaine, THC Positive,  Lipid Panel (4/18): WNL, A1c(4/26): 5.0, TSH(4/26): 0.962, EKG: NSR w/ Sinus Arrhythmia w/ Qtc: 448)              -- Encouraged patient to participate in unit milieu and in scheduled group therapies              -- Short Term Goals: Ability to identify changes in lifestyle to reduce recurrence of condition will improve, Ability to verbalize feelings will improve, Ability to disclose and discuss suicidal ideas, Ability to demonstrate Rubio-control will improve, Ability to identify and develop effective coping behaviors will improve, Ability to maintain clinical measurements within normal limits will improve, Compliance with prescribed medications will improve, and Ability to identify triggers associated with substance abuse/mental health issues will improve             -- Long Term Goals: Improvement in symptoms so as ready for discharge   Safety and Monitoring:             -- Voluntary admission to inpatient psychiatric unit for safety, stabilization and treatment             -- Daily contact with patient to assess and evaluate symptoms and progress in treatment             -- Patient's case to be discussed in multi-disciplinary team meeting             --  Observation Level : q15 minute checks             -- Vital signs:  q12 hours             -- Precautions: suicide, elopement, and assault  Discharge Planning:              -- Social work and case management to assist with discharge planning and identification of hospital follow-up needs prior to discharge             -- Estimated LOS: 2-4 more days             -- Discharge Concerns: Need to establish a safety plan; Medication compliance and effectiveness             -- Discharge Goals: Return home with outpatient referrals for mental health follow-up including medication  management/psychotherapy=   Marsa GORMAN Rosser, DO 12/26/2023, 12:30 PM

## 2023-12-26 NOTE — Plan of Care (Signed)
   Problem: Education: Goal: Knowledge of [place] [facility] Education information/materials will improve Outcome: Progressing Goal: Emotional status will improve Outcome: Progressing

## 2023-12-26 NOTE — Progress Notes (Signed)
   12/26/23 0000  Psych Admission Type (Psych Patients Only)  Admission Status Voluntary  Psychosocial Assessment  Patient Complaints Isolation  Eye Contact Fair  Facial Expression Flat  Affect Anxious  Speech Soft  Interaction Guarded  Motor Activity Restless  Appearance/Hygiene In scrubs  Behavior Characteristics Cooperative;Appropriate to situation  Mood Anxious  Aggressive Behavior  Effect No apparent injury  Thought Process  Coherency WDL  Content Paranoia  Delusions Paranoid  Perception Hallucinations  Hallucination Auditory  Judgment Poor  Confusion None  Danger to Self  Current suicidal ideation? Denies  Agreement Not to Harm Self Yes  Danger to Others  Danger to Others None reported or observed  Danger to Others Abnormal  Harmful Behavior to others No threats or harm toward other people  Destructive Behavior No threats or harm toward property

## 2023-12-26 NOTE — Group Note (Unsigned)
 Date:  12/26/2023 Time:  9:29 AM  Group Topic/Focus:  Goals Group:   The focus of this group is to help patients establish daily goals to achieve during treatment and discuss how the patient can incorporate goal setting into their daily lives to aide in recovery.     Participation Level:  {BHH PARTICIPATION OZCZO:77735}  Participation Quality:  {BHH PARTICIPATION QUALITY:22265}  Affect:  {BHH AFFECT:22266}  Cognitive:  {BHH COGNITIVE:22267}  Insight: {BHH Insight2:20797}  Engagement in Group:  {BHH ENGAGEMENT IN HMNLE:77731}  Modes of Intervention:  {BHH MODES OF INTERVENTION:22269}  Additional Comments:  ***  Aaron Rubio 12/26/2023, 9:29 AM

## 2023-12-26 NOTE — Group Note (Signed)
 Date:  12/26/2023 Time:  9:41 PM  Group Topic/Focus:  Narcotics Anonymous (NA) Meeting    Participation Level:  Did Not Attend  Participation Quality:  N/A  Affect:  N/A  Cognitive:  N/A  Insight: None  Engagement in Group:  N/A  Modes of Intervention:  N/A  Additional Comments:  Patient did not attended NA  Eward Mace 12/26/2023, 9:41 PM

## 2023-12-26 NOTE — Progress Notes (Signed)
   12/26/23 1600  Psych Admission Type (Psych Patients Only)  Admission Status Voluntary  Psychosocial Assessment  Patient Complaints Isolation;Other (Comment) (I need to reaclimated to society)  Eye Contact Fair  Facial Expression Animated  Affect Anxious  Speech Soft  Interaction Guarded (Laughs inappropriately)  Appearance/Hygiene In scrubs  Behavior Characteristics Unwilling to participate (Pt has room lock out for groups now)  Mood Preoccupied  Thought Process  Coherency WDL  Content Preoccupation;Blaming others  Delusions None reported or observed  Perception  (Responding to internal stimuli)  Hallucination Auditory  Judgment Poor  Confusion None  Danger to Self  Current suicidal ideation? Denies  Self-Injurious Behavior No self-injurious ideation or behavior indicators observed or expressed   Agreement Not to Harm Self Yes  Description of Agreement Verbal

## 2023-12-26 NOTE — Group Note (Signed)
 Recreation Therapy Group Note   Group Topic:Communication  Group Date: 12/26/2023 Start Time: 9065 End Time: 1008 Facilitators: Delford Wingert-McCall, LRT,CTRS Location: [address] Dayroom   Group Topic: Communication, Problem Solving   Goal Area(s) Addresses:  Patient will effectively listen to complete activity.  Patient will identify communication skills used to make activity successful.  Patient will identify how skills used during activity can be used to reach post d/c goals.    Behavioral Response:    Intervention: Building surveyor Activity - Geometric pattern cards, pencils, blank paper    Activity: Geometric Drawings.  Three volunteers from the peer group will be shown an abstract picture with a particular arrangement of geometrical shapes.  Each round, one 'speaker' will describe the pattern, as accurately as possible without revealing the image to the group.  The remaining group members will listen and draw the picture to reflect how it is described to them. Patients with the role of 'listener' cannot ask clarifying questions but, may request that the speaker repeat a direction. Once the drawings are complete, the presenter will show the rest of the group the picture and compare how close each person came to drawing the picture. LRT will facilitate a post-activity discussion regarding effective communication and the importance of planning, listening, and asking for clarification in daily interactions with others.  Education: Environmental consultant, Active listening, Support systems, Discharge planning  Education Outcome: Acknowledges understanding/In group clarification offered/Needs additional education.    Affect/Mood: N/A   Participation Level: Did not attend    Clinical Observations/Individualized Feedback:     Plan: Continue to engage patient in RT group sessions 2-3x/week.   Riven Beebe-McCall, LRT,CTRS  12/26/2023 1:12 PM

## 2023-12-27 MED ORDER — HYDROCHLOROTHIAZIDE 12.5 MG PO TABS
12.5000 mg | ORAL_TABLET | Freq: Every day | ORAL | Status: DC
  Administered 2023-12-27 – 2024-01-02 (×7): 12.5 mg via ORAL
  Filled 2023-12-27 (×7): qty 1

## 2023-12-27 NOTE — Progress Notes (Addendum)
 Pt asked 2x emphatically to be discharged to [facility] tomorrow because it's Friday and that's when they do admissions. He is determined to go and requesting to meet with treatment to to this end.   12/27/23 1600  Psych Admission Type (Psych Patients Only)  Admission Status Voluntary  Psychosocial Assessment  Patient Complaints Sleep disturbance  Eye Contact Fair  Facial Expression Animated  Affect Anxious  Speech Soft  Interaction Assertive;No initiation;Flirtatious (Pt came to group then got coffee and went back to room)  Motor Activity Slow  Appearance/Hygiene In scrubs  Behavior Characteristics Resistant to care;Other (Comment) (Pt asking about discharge plans and requested to speak to SW. Pt did not endorse ownership about not following through with aftercare/treatment facilities in the past; pt is limited)  Mood Depressed;Preoccupied  Thought Process  Coherency WDL  Content Preoccupation;Blaming others  Delusions None reported or observed  Perception Derealization (Responding to internal stimuli)  Hallucination Auditory  Judgment Poor  Confusion None  Danger to Self  Current suicidal ideation? Denies  Self-Injurious Behavior No self-injurious ideation or behavior indicators observed or expressed   Agreement Not to Harm Self Yes  Description of Agreement Verbal  Danger to Others  Danger to Others None reported or observed  Danger to Others Abnormal  Harmful Behavior to others No threats or harm toward other people  Destructive Behavior No threats or harm toward property

## 2023-12-27 NOTE — Plan of Care (Signed)

## 2023-12-27 NOTE — Plan of Care (Signed)
  Problem: Education: Goal: Knowledge of [place] [facility] Education information/materials will improve Outcome: Progressing Goal: Emotional status will improve Outcome: Progressing Goal: Mental status will improve Outcome: Progressing   Problem: Activity: Goal: Interest or engagement in activities will improve Outcome: Not Progressing; pt not interested in groups; stating he is not well and must rest Goal: Sleeping patterns will improve Outcome: Not Progressing

## 2023-12-27 NOTE — Progress Notes (Signed)
   12/27/23 2300  Psych Admission Type (Psych Patients Only)  Admission Status Voluntary  Psychosocial Assessment  Patient Complaints Isolation;Loneliness  Eye Contact Fair  Facial Expression Flat  Affect Anxious  Speech Soft  Interaction Guarded;Forwards little  Motor Activity Slow  Appearance/Hygiene Unremarkable  Behavior Characteristics Cooperative  Mood Depressed;Anxious;Preoccupied  Thought Process  Coherency WDL  Content Paranoia  Delusions Paranoid  Perception Hallucinations  Hallucination Auditory  Judgment Poor  Confusion None  Danger to Self  Current suicidal ideation? Denies  Description of Suicide Plan None  Self-Injurious Behavior No self-injurious ideation or behavior indicators observed or expressed   Agreement Not to Harm Self Yes  Description of Agreement Verbal  Danger to Others  Danger to Others None reported or observed  Danger to Others Abnormal  Harmful Behavior to others No threats or harm toward other people  Destructive Behavior No threats or harm toward property

## 2023-12-27 NOTE — Group Note (Signed)
 LCSW Group Therapy Note   Group Date: 12/27/2023 Start Time: 1100 End Time: 1200   Participation:  patient was present for 75% of the group session.  He actively participated in the discussion.  Type of Therapy:  Group Therapy  Topic:  Healing Hearts: A Safe Space for Grief  Objective:  The objective of this group, Healing Hearts: A Safe Space for Grief, is to create a compassionate environment where participants can process their grief, explore different stages of grief, and discover ways to honor their loved ones through personal rituals.  3 Goals: Provide a safe and supportive space where participants feel comfortable sharing their feelings and experiences of grief without judgment. Educate participants about the stages of grief and emphasize that there is no right way to grieve or a fixed timeline for healing. Introduce the concept of rituals as a means to process grief, allowing individuals to honor their loved ones in a personal and meaningful way.  Summary:  In Healing Hearts: A Safe Space for Grief, we explored the unique and personal journey of grief, emphasizing that everyone experiences it differently. We discussed the 5 stages of grief (denial, anger, bargaining, depression, and acceptance), with the understanding that grief is not linear. Rituals were introduced as a way to help cope with loss, offering comfort and connection through meaningful actions such as lighting candles or taking memory walks. Participants were encouraged to express their emotions, focus on self-care, and reflect on moments of gratitude for their loved ones, recognizing that healing is a process and there is no timeline for grief.  Therapeutic Modalities: Elements of CBT, Cognitive Reframing: challenge negative thoughts (e.g., guilt, self-blame) by identifying and replacing them with healthier perspectives. Normalizing Grief: Educate that grief doesn't have a fixed timeline and everyone experiences it  differently).  DBT Techniques:  Radical Acceptance: Accept the pain of loss without judgment, acknowledging that grief is a natural and necessary process.   Group Support: Foster peer sharing, normalizing diverse grief experiences in a safe, supportive environment.   [name], LCSWA 12/27/2023  5:44 PM

## 2023-12-27 NOTE — Progress Notes (Signed)
 [facility] MD Progress Note  12/27/2023 10:23 AM Aaron Rubio  MRN:  [id number] Subjective:   Aaron Rubio is a 43 yr old male who presented on 6/22 to [facility] reporting SI and with bizarre behavior, he was admitted to [facility] on 6/23. PPHx is significant for Paranoid Schizophrenia, Depression, Polysubstance Abuse (Cocaine, THC), and Medication Non-Compliance, and 1 Suicide Attempt (wrapped towel around neck 2008) and Multiple Prior Psychiatric Hospitalizations (last- [facility] 12/15/2023), and no history of Self Injurious Behavior.    Case was discussed in the multidisciplinary team. MAR was reviewed and patient was compliant with medications.  He received PRN Tylenol  yesterday.   Psychiatric Team made the following recommendations yesterday: -Decrease Zyprexa  to 20 mg QHS for psychosis -Increase Haldol  to 10 mg BID for psychosis -Continue Remeron  15 mg QHS for depression and sleep    On interview today patient reports he slept fair last night.  He reports his appetite is doing fair.  He reports no SI, HI, or VH.  He reports continuing to have voices but that he is now questioning whether they are real or not.  He reports no Paranoia or Ideas of Reference.  He reports no issues with his medications.  He is reporting continued issues with blood pressure.  He reports that he is having a significant headache and some dizziness today.  Discussed with him that we would start hydrochlorothiazide to further address his blood pressure and he was agreeable with this.  Encouraged him to use as needed Tylenol  to address his headache and he reported understanding.  He reports no other concerns present.     Principal Problem: Paranoid schizophrenia (HCC) Diagnosis: Principal Problem:   Paranoid schizophrenia (HCC) Active Problems:   Polysubstance abuse (HCC)   MDD (major depressive disorder)  Total Time spent with patient:  I personally spent 35 minutes on the unit in direct patient care. The direct patient  care time included face-to-face time with the patient, reviewing the patient's chart, communicating with other professionals, and coordinating care. Greater than 50% of this time was spent in counseling or coordinating care with the patient regarding goals of hospitalization, psycho-education, and discharge planning needs.   Past Psychiatric History:  Paranoid Schizophrenia, Depression, Polysubstance Abuse (Cocaine, THC), and Medication Non-Compliance, and 1 Suicide Attempt (wrapped towel around neck 2008) and Multiple Prior Psychiatric Hospitalizations (last- [facility] 12/15/2023), and no history of Self Injurious Behavior.   Past Medical History:  Past Medical History:  Diagnosis Date   Hypertension    Schizophrenia (HCC)    History reviewed. No pertinent surgical history. Family History: History reviewed. No pertinent family history. Family Psychiatric  History:  Reports No Known Diagnosis', Substance Abuse, or Suicides   Social History:  Social History   Substance and Sexual Activity  Alcohol Use Yes     Social History   Substance and Sexual Activity  Drug Use Yes   Types: Cocaine, Marijuana    Social History   Socioeconomic History   Marital status: Single    Spouse name: Not on file   Number of children: Not on file   Years of education: Not on file   Highest education level: Not on file  Occupational History   Not on file  Tobacco Use   Smoking status: Every Day    Current packs/day: 0.30    Types: Cigarettes   Smokeless tobacco: Not on file  Vaping Use   Vaping status: Never Used  Substance and Sexual Activity   Alcohol use: Yes  Drug use: Yes    Types: Cocaine, Marijuana   Sexual activity: Not on file  Other Topics Concern   Not on file  Social History Narrative   Not on file   Social Drivers of Health   Financial Resource Strain: Not on file  Food Insecurity: Food Insecurity Present (12/24/2023)   Hunger Vital Sign    Worried About Running Out of Food in  the Last Year: Sometimes true    Ran Out of Food in the Last Year: Sometimes true  Transportation Needs: Unmet Transportation Needs (12/24/2023)   PRAPARE - Administrator, Civil Service (Medical): Yes    Lack of Transportation (Non-Medical): Yes  Physical Activity: Not on file  Stress: Not on file  Social Connections: Unknown (05/08/2022)   Received from [employer]   Social Network    Social Network: Not on file   Additional Social History:                         Sleep: Fair Estimated Sleeping Duration (Last 24 Hours): 7.75-8.25 hours  Appetite:  Fair  Current Medications: Current Facility-Administered Medications  Medication Dose Route Frequency Provider Last Rate Last Admin   acetaminophen  (TYLENOL ) tablet 650 mg  650 mg Oral Q6H PRN Bobbitt, Shalon E, NP   650 mg at 12/26/23 1049   alum & mag hydroxide-simeth (MAALOX/MYLANTA) 200-200-20 MG/5ML suspension 30 mL  30 mL Oral Q4H PRN Bobbitt, Shalon E, NP       amLODipine  (NORVASC ) tablet 10 mg  10 mg Oral Daily Bobbitt, Shalon E, NP   10 mg at 12/27/23 9177   haloperidol  (HALDOL ) tablet 5 mg  5 mg Oral TID PRN Bobbitt, Shalon E, NP       And   diphenhydrAMINE  (BENADRYL ) capsule 50 mg  50 mg Oral TID PRN Bobbitt, Shalon E, NP       haloperidol  lactate (HALDOL ) injection 5 mg  5 mg Intramuscular TID PRN Bobbitt, Shalon E, NP       And   diphenhydrAMINE  (BENADRYL ) injection 50 mg  50 mg Intramuscular TID PRN Bobbitt, Shalon E, NP       And   LORazepam  (ATIVAN ) injection 2 mg  2 mg Intramuscular TID PRN Bobbitt, Shalon E, NP       haloperidol  lactate (HALDOL ) injection 10 mg  10 mg Intramuscular TID PRN Bobbitt, Shalon E, NP       And   diphenhydrAMINE  (BENADRYL ) injection 50 mg  50 mg Intramuscular TID PRN Bobbitt, Shalon E, NP       And   LORazepam  (ATIVAN ) injection 2 mg  2 mg Intramuscular TID PRN Bobbitt, Shalon E, NP       haloperidol  (HALDOL ) tablet 10 mg  10 mg Oral BID Ibn Stief S, DO    10 mg at 12/27/23 0822   hydrochlorothiazide (HYDRODIURIL) tablet 12.5 mg  12.5 mg Oral Daily Macayla Ekdahl S, DO       hydrOXYzine  (ATARAX ) tablet 25 mg  25 mg Oral TID PRN Bobbitt, Shalon E, NP   25 mg at 12/25/23 0910   magnesium  hydroxide (MILK OF MAGNESIA) suspension 30 mL  30 mL Oral Daily PRN Bobbitt, Shalon E, NP       mirtazapine  (REMERON ) tablet 15 mg  15 mg Oral QHS Davan Nawabi S, DO   15 mg at 12/26/23 2129   OLANZapine  (ZYPREXA ) tablet 20 mg  20 mg Oral QHS Patrisha Hausmann S, DO  20 mg at 12/26/23 2129   traZODone  (DESYREL ) tablet 50 mg  50 mg Oral QHS PRN Bobbitt, Shalon E, NP       Vitamin D (Ergocalciferol) (DRISDOL) 1.25 MG (50000 UNIT) capsule 50,000 Units  50,000 Units Oral Daily Parker, Alvin S, MD   50,000 Units at 12/27/23 9177    Lab Results:  No results found for this or any previous visit (from the past 48 hours).   Blood Alcohol level:  Lab Results  Component Value Date   [facility] <15 12/23/2023   ETH <15 12/14/2023    Metabolic Disorder Labs: Lab Results  Component Value Date   HGBA1C 5.0 10/27/2023   MPG 96.8 10/27/2023   No results found for: PROLACTIN Lab Results  Component Value Date   CHOL 152 10/19/2023   TRIG 144 10/19/2023   HDL 57 10/19/2023   CHOLHDL 2.7 10/19/2023   VLDL 29 10/19/2023   LDLCALC 66 10/19/2023    Physical Findings: AIMS:  ,  ,  ,  ,  ,  ,   CIWA:    COWS:     Musculoskeletal: Strength & Muscle Tone: within normal limits Gait & Station: normal Patient leans: N/A  Psychiatric Specialty Exam:  Presentation  General Appearance:  Appropriate for Environment; Casual  Eye Contact: Fair  Speech: Clear and Coherent  Speech Volume: Decreased  Handedness: Right   Mood and Affect  Mood: Dysphoric  Affect: Congruent   Thought Process  Thought Processes: Linear  Descriptions of Associations:Intact  Orientation:Full (Time, Place and Person)  Thought Content:Delusions  History of  Schizophrenia/Schizoaffective disorder:Yes  Duration of Psychotic Symptoms:Greater than six months  Hallucinations:Hallucinations: Auditory Description of Auditory Hallucinations: improving  Ideas of Reference:Paranoia  Suicidal Thoughts:Suicidal Thoughts: No  Homicidal Thoughts:Homicidal Thoughts: No   Sensorium  Memory: Immediate Fair  Judgment: Intact  Insight: Present   Executive Functions  Concentration: Fair  Attention Span: Fair  Recall: [employer] of Knowledge: Fair  Language: Fair   Psychomotor Activity  Psychomotor Activity: Psychomotor Activity: Normal   Assets  Assets: Resilience   Sleep  Sleep: Sleep: Fair    Physical Exam: Physical Exam Review of Systems  Respiratory:  Negative for cough and shortness of breath.   Cardiovascular:  Negative for chest pain.  Gastrointestinal:  Negative for abdominal pain, constipation, diarrhea, nausea and vomiting.  Neurological:  Positive for dizziness and headaches. Negative for weakness.  Psychiatric/Behavioral:  Positive for hallucinations (AH- improving). Negative for depression and suicidal ideas. The patient is not nervous/anxious.    Blood pressure (!) 145/108, pulse 74, temperature 98.4 F (36.9 C), temperature source Oral, resp. rate 18, height 6' 2 (1.88 m), weight 104.8 kg, SpO2 94%. Body mass index is 29.66 kg/m.   Treatment Plan Summary: Daily contact with patient to assess and evaluate symptoms and progress in treatment and Medication management   Aaron Rubio is a 43 yr old male who presented on 6/22 to [facility] reporting SI and with bizarre behavior, he was admitted to [facility] on 6/23.  PPHx is significant for Paranoid Schizophrenia, Depression, Polysubstance Abuse (Cocaine, THC), and Medication Non-Compliance, and 1 Suicide Attempt (wrapped towel around neck 2008) and Multiple Prior Psychiatric Hospitalizations (last- [facility] 12/15/2023), and no history of Self Injurious Behavior.      Maddix is reporting improvement in his AH as today he is questioning whether they are real or not.  He is reporting continued problems with elevated blood pressure with headache and dizziness.  We will start hydrochlorothiazide  to address this.  We will not make any other changes to his medications at this time.  We will continue to monitor.     Paranoid Schizophrenia: -Continue Zyprexa  20 mg QHS for psychosis -Continue Haldol  10 mg BID for psychosis -Continue Remeron  15 mg QHS for depression and sleep -Continue Agitation Protocol: Haldol /Ativan /Benadryl     HTN:  -Continue Amlodipine  10 mg daily -Start HCTZ 12.5 mg daily   -Continue PRN's: Tylenol , Maalox, Atarax , Milk of Magnesia, Trazodone    --  The risks/benefits/side-effects/alternatives to medications were discussed in detail with the patient and time was given for questions. The patient consents to medication trials.                -- Metabolic profile and EKG monitoring obtained while on an atypical antipsychotic (CMP: WNL except  BUN: 22, Ca: 8.6, Tot Prot: 6.3,  CBC: WNL except Hem: 12.3, Eosin Abs: 0.9, UDS: Cocaine, THC Positive,  Lipid Panel (4/18): WNL, A1c(4/26): 5.0, TSH(4/26): 0.962, EKG: NSR w/ Sinus Arrhythmia w/ Qtc: 448)              -- Encouraged patient to participate in unit milieu and in scheduled group therapies              -- Short Term Goals: Ability to identify changes in lifestyle to reduce recurrence of condition will improve, Ability to verbalize feelings will improve, Ability to disclose and discuss suicidal ideas, Ability to demonstrate self-control will improve, Ability to identify and develop effective coping behaviors will improve, Ability to maintain clinical measurements within normal limits will improve, Compliance with prescribed medications will improve, and Ability to identify triggers associated with substance abuse/mental health issues will improve             -- Long Term Goals: Improvement in  symptoms so as ready for discharge   Safety and Monitoring:             -- Voluntary admission to inpatient psychiatric unit for safety, stabilization and treatment             -- Daily contact with patient to assess and evaluate symptoms and progress in treatment             -- Patient's case to be discussed in multi-disciplinary team meeting             -- Observation Level : q15 minute checks             -- Vital signs:  q12 hours             -- Precautions: suicide, elopement, and assault  Discharge Planning:              -- Social work and case management to assist with discharge planning and identification of hospital follow-up needs prior to discharge             -- Estimated LOS: 2-4 more days             -- Discharge Concerns: Need to establish a safety plan; Medication compliance and effectiveness             -- Discharge Goals: Return home with outpatient referrals for mental health follow-up including medication management/psychotherapy=   Aaron GORMAN Rosser, DO 12/27/2023, 10:23 AM

## 2023-12-27 NOTE — Progress Notes (Signed)
 Aaron Rubio did not attend wrap up group.

## 2023-12-27 NOTE — Progress Notes (Signed)
 D: Pt alert and oriented. Pt rates depression 7/10 and anxiety 3/10.  Pt denies experiencing any SI/HI, or AVH at this time.   A: Scheduled medications administered to pt, per MD orders. Support and encouragement provided. Frequent verbal contact made. Routine safety checks conducted q15 minutes.   R: No adverse drug reactions noted. Pt verbally contracts for safety at this time. Pt complaint with medications and treatment plan. Pt interacts well with others on the unit. Pt remains safe at this time. Will continue to monitor for safety.

## 2023-12-27 NOTE — Plan of Care (Signed)

## 2023-12-28 ENCOUNTER — Encounter (HOSPITAL_COMMUNITY): Payer: Self-pay

## 2023-12-28 DIAGNOSIS — F200 Paranoid schizophrenia: Secondary | ICD-10-CM | POA: Diagnosis not present

## 2023-12-28 MED ORDER — HALOPERIDOL 5 MG PO TABS
20.0000 mg | ORAL_TABLET | Freq: Every day | ORAL | Status: DC
  Administered 2023-12-28 – 2024-01-01 (×5): 20 mg via ORAL
  Filled 2023-12-28 (×4): qty 4

## 2023-12-28 MED ORDER — HALOPERIDOL DECANOATE 100 MG/ML IM SOLN
200.0000 mg | Freq: Once | INTRAMUSCULAR | Status: AC
  Administered 2023-12-28: 200 mg via INTRAMUSCULAR
  Filled 2023-12-28: qty 2

## 2023-12-28 MED ORDER — HALOPERIDOL 5 MG PO TABS
10.0000 mg | ORAL_TABLET | Freq: Every day | ORAL | Status: DC
  Administered 2023-12-29 – 2024-01-02 (×5): 10 mg via ORAL
  Filled 2023-12-28 (×5): qty 2

## 2023-12-28 NOTE — Progress Notes (Signed)
   12/28/23 0858  Psych Admission Type (Psych Patients Only)  Admission Status Voluntary  Psychosocial Assessment  Patient Complaints Isolation  Eye Contact Fair  Facial Expression Flat  Affect Irritable;Sad  Speech Soft  Interaction Guarded  Motor Activity Slow  Appearance/Hygiene Unremarkable  Behavior Characteristics Appropriate to situation  Mood Irritable;Sad  Thought Process  Coherency WDL  Content Paranoia  Delusions Paranoid  Perception WDL  Hallucination None reported or observed (Pt denied.)  Judgment Poor  Confusion None  Danger to Self  Current suicidal ideation? Denies  Self-Injurious Behavior No self-injurious ideation or behavior indicators observed or expressed   Agreement Not to Harm Self Yes  Description of Agreement Verbal  Danger to Others  Danger to Others None reported or observed  Danger to Others Abnormal  Harmful Behavior to others No threats or harm toward other people  Destructive Behavior No threats or harm toward property

## 2023-12-28 NOTE — Plan of Care (Signed)
   Problem: Activity: Goal: Sleeping patterns will improve Outcome: Progressing   Problem: Safety: Goal: Periods of time without injury will increase Outcome: Progressing

## 2023-12-28 NOTE — Progress Notes (Signed)
 [facility] MD Progress Note  12/28/2023 1:32 PM Aaron Rubio  MRN:  [id number] Subjective:   Aaron Rubio is a 43 yr old male who presented on 6/22 to [facility] reporting SI and with bizarre behavior, he was admitted to [facility] on 6/23. PPHx is significant for Paranoid Schizophrenia, Depression, Polysubstance Abuse (Cocaine, THC), and Medication Non-Compliance, and 1 Suicide Attempt (wrapped towel around neck 2008) and Multiple Prior Psychiatric Hospitalizations (last- [facility] 12/15/2023), and no history of Self Injurious Behavior.    Case was discussed in the multidisciplinary team. MAR was reviewed and patient was compliant with medications.  He received PRN Tylenol  yesterday.   The patient was seen in his room during rounds.  Today he is focused on discharge because he wants to go to the [facility] [place] before the weekend.  He continues to present as paranoid, experiencing auditory hallucinations, and demonstrating poor hygiene.  He remains isolated to his room most of the time and continues to present as paranoid.  He denies any issues with Haldol  at this time, and is amenable to taking a Haldol  decanoate injection.  As he has tolerated 20 mg of Haldol  day without issue, we will give him a 200 mg decanoate injection today.  I also increased his oral Haldol  to 30 mg a day and stopped olanzapine , so additional 100 mg Haldol  decanoate injection could be considered prior to discharge.  The patient is known to have chronically poor medication compliance and is unlikely to take medications after discharge.    Principal Problem: Paranoid schizophrenia (HCC) Diagnosis: Principal Problem:   Paranoid schizophrenia (HCC) Active Problems:   Polysubstance abuse (HCC)   MDD (major depressive disorder)  Total Time spent with patient:  I personally spent 35 minutes on the unit in direct patient care. The direct patient care time included face-to-face time with the patient, reviewing the patient's chart, communicating with other  professionals, and coordinating care. Greater than 50% of this time was spent in counseling or coordinating care with the patient regarding goals of hospitalization, psycho-education, and discharge planning needs.   Past Psychiatric History:  Paranoid Schizophrenia, Depression, Polysubstance Abuse (Cocaine, THC), and Medication Non-Compliance, and 1 Suicide Attempt (wrapped towel around neck 2008) and Multiple Prior Psychiatric Hospitalizations (last- [facility] 12/15/2023), and no history of Self Injurious Behavior.   Past Medical History:  Past Medical History:  Diagnosis Date   Hypertension    Schizophrenia (HCC)    History reviewed. No pertinent surgical history. Family History: History reviewed. No pertinent family history. Family Psychiatric  History:  Reports No Known Diagnosis', Substance Abuse, or Suicides   Social History:  Social History   Substance and Sexual Activity  Alcohol Use Yes     Social History   Substance and Sexual Activity  Drug Use Yes   Types: Cocaine, Marijuana    Social History   Socioeconomic History   Marital status: Single    Spouse name: Not on file   Number of children: Not on file   Years of education: Not on file   Highest education level: Not on file  Occupational History   Not on file  Tobacco Use   Smoking status: Every Day    Current packs/day: 0.30    Types: Cigarettes   Smokeless tobacco: Not on file  Vaping Use   Vaping status: Never Used  Substance and Sexual Activity   Alcohol use: Yes   Drug use: Yes    Types: Cocaine, Marijuana   Sexual activity: Not on file  Other Topics Concern   Not on file  Social History Narrative   Not on file   Social Drivers of Health   Financial Resource Strain: Not on file  Food Insecurity: Food Insecurity Present (12/24/2023)   Hunger Vital Sign    Worried About Running Out of Food in the Last Year: Sometimes true    Ran Out of Food in the Last Year: Sometimes true  Transportation Needs:  Unmet Transportation Needs (12/24/2023)   PRAPARE - Administrator, Civil Service (Medical): Yes    Lack of Transportation (Non-Medical): Yes  Physical Activity: Not on file  Stress: Not on file  Social Connections: Unknown (05/08/2022)   Received from [employer]   Social Network    Social Network: Not on file   Additional Social History:                         Sleep: Fair Estimated Sleeping Duration (Last 24 Hours): 6.00-6.25 hours  Appetite:  Fair  Current Medications: Current Facility-Administered Medications  Medication Dose Route Frequency Provider Last Rate Last Admin   acetaminophen  (TYLENOL ) tablet 650 mg  650 mg Oral Q6H PRN Bobbitt, Shalon E, NP   650 mg at 12/27/23 1603   alum & mag hydroxide-simeth (MAALOX/MYLANTA) 200-200-20 MG/5ML suspension 30 mL  30 mL Oral Q4H PRN Bobbitt, Shalon E, NP       amLODipine  (NORVASC ) tablet 10 mg  10 mg Oral Daily Bobbitt, Shalon E, NP   10 mg at 12/28/23 9175   haloperidol  (HALDOL ) tablet 5 mg  5 mg Oral TID PRN Bobbitt, Shalon E, NP       And   diphenhydrAMINE  (BENADRYL ) capsule 50 mg  50 mg Oral TID PRN Bobbitt, Shalon E, NP       haloperidol  lactate (HALDOL ) injection 5 mg  5 mg Intramuscular TID PRN Bobbitt, Shalon E, NP       And   diphenhydrAMINE  (BENADRYL ) injection 50 mg  50 mg Intramuscular TID PRN Bobbitt, Shalon E, NP       And   LORazepam  (ATIVAN ) injection 2 mg  2 mg Intramuscular TID PRN Bobbitt, Shalon E, NP       haloperidol  lactate (HALDOL ) injection 10 mg  10 mg Intramuscular TID PRN Bobbitt, Shalon E, NP       And   diphenhydrAMINE  (BENADRYL ) injection 50 mg  50 mg Intramuscular TID PRN Bobbitt, Shalon E, NP       And   LORazepam  (ATIVAN ) injection 2 mg  2 mg Intramuscular TID PRN Bobbitt, Shalon E, NP       [START ON 12/29/2023] haloperidol  (HALDOL ) tablet 10 mg  10 mg Oral q AM Kennyth Starleen RAMAN, MD       haloperidol  (HALDOL ) tablet 20 mg  20 mg Oral QHS Kennyth Starleen RAMAN, MD        haloperidol  decanoate (HALDOL  DECANOATE) 100 MG/ML injection 200 mg  200 mg Intramuscular Once Kennyth Starleen RAMAN, MD       hydrochlorothiazide  (HYDRODIURIL ) tablet 12.5 mg  12.5 mg Oral Daily Pashayan, Alexander S, DO   12.5 mg at 12/28/23 9175   hydrOXYzine  (ATARAX ) tablet 25 mg  25 mg Oral TID PRN Bobbitt, Shalon E, NP   25 mg at 12/25/23 0910   magnesium  hydroxide (MILK OF MAGNESIA) suspension 30 mL  30 mL Oral Daily PRN Bobbitt, Shalon E, NP       mirtazapine  (REMERON ) tablet 15 mg  15  mg Oral QHS Pashayan, Alexander S, DO   15 mg at 12/27/23 2143   traZODone  (DESYREL ) tablet 50 mg  50 mg Oral QHS PRN Bobbitt, Shalon E, NP       Vitamin D  (Ergocalciferol ) (DRISDOL ) 1.25 MG (50000 UNIT) capsule 50,000 Units  50,000 Units Oral Daily Exa Bomba S, MD   50,000 Units at 12/27/23 9177    Lab Results:  No results found for this or any previous visit (from the past 48 hours).   Blood Alcohol level:  Lab Results  Component Value Date   [facility] <15 12/23/2023   ETH <15 12/14/2023    Metabolic Disorder Labs: Lab Results  Component Value Date   HGBA1C 5.0 10/27/2023   MPG 96.8 10/27/2023   No results found for: PROLACTIN Lab Results  Component Value Date   CHOL 152 10/19/2023   TRIG 144 10/19/2023   HDL 57 10/19/2023   CHOLHDL 2.7 10/19/2023   VLDL 29 10/19/2023   LDLCALC 66 10/19/2023    Physical Findings: AIMS:  ,  ,  ,  ,  ,  ,   CIWA:    COWS:     Musculoskeletal: Strength & Muscle Tone: within normal limits Gait & Station: normal Patient leans: N/A  Psychiatric Specialty Exam:  Presentation  General Appearance:  Appropriate for Environment; Casual  Eye Contact: Fair  Speech: Clear and Coherent  Speech Volume: Decreased  Handedness: Right   Mood and Affect  Mood: Dysphoric  Affect: Congruent   Thought Process  Thought Processes: Linear  Descriptions of Associations:Intact  Orientation:Full (Time, Place and Person)  Thought  Content:Delusions  History of Schizophrenia/Schizoaffective disorder:Yes  Duration of Psychotic Symptoms:Greater than six months  Hallucinations:Hallucinations: Auditory Description of Auditory Hallucinations: improving  Ideas of Reference:Paranoia  Suicidal Thoughts:Suicidal Thoughts: No  Homicidal Thoughts:Homicidal Thoughts: No   Sensorium  Memory: Immediate Fair  Judgment: Intact  Insight: Present   Executive Functions  Concentration: Fair  Attention Span: Fair  Recall: [employer] of Knowledge: Fair  Language: Fair   Psychomotor Activity  Psychomotor Activity: Psychomotor Activity: Normal   Assets  Assets: Resilience   Sleep  Sleep: Sleep: Fair    Physical Exam: Physical Exam Review of Systems  Respiratory:  Negative for cough and shortness of breath.   Cardiovascular:  Negative for chest pain.  Gastrointestinal:  Negative for abdominal pain, constipation, diarrhea, nausea and vomiting.  Neurological:  Positive for dizziness and headaches. Negative for weakness.  Psychiatric/Behavioral:  Positive for hallucinations (AH- improving). Negative for depression and suicidal ideas. The patient is not nervous/anxious.    Blood pressure 110/76, pulse 68, temperature 97.6 F (36.4 C), temperature source Oral, resp. rate 19, height 6' 2 (1.88 m), weight 104.8 kg, SpO2 97%. Body mass index is 29.66 kg/m.   Treatment Plan Summary: Daily contact with patient to assess and evaluate symptoms and progress in treatment and Medication management   Kiano M. Landress is a 43 yr old male who presented on 6/22 to [facility] reporting SI and with bizarre behavior, he was admitted to [facility] on 6/23.  PPHx is significant for Paranoid Schizophrenia, Depression, Polysubstance Abuse (Cocaine, THC), and Medication Non-Compliance, and 1 Suicide Attempt (wrapped towel around neck 2008) and Multiple Prior Psychiatric Hospitalizations (last- [facility] 12/15/2023), and no history of Self  Injurious Behavior.    Patient Active Problem List   Diagnosis Date Noted   MDD (major depressive disorder) 12/24/2023   Homelessness 12/16/2023   Paranoid schizophrenia (HCC) 12/15/2023   Chronic paranoid  schizophrenia (HCC) 10/31/2023   Passive suicidal ideations 05/05/2023   Psychosis (HCC) 05/05/2023   Polysubstance abuse (HCC) 05/05/2023    amLODipine   10 mg Oral Daily   [START ON 12/29/2023] haloperidol   10 mg Oral q AM   haloperidol   20 mg Oral QHS   haloperidol  decanoate  200 mg Intramuscular Once   hydrochlorothiazide   12.5 mg Oral Daily   mirtazapine   15 mg Oral QHS   Vitamin D  (Ergocalciferol )  50,000 Units Oral Daily   acetaminophen , alum & mag hydroxide-simeth, haloperidol  **AND** diphenhydrAMINE , haloperidol  lactate **AND** diphenhydrAMINE  **AND** LORazepam , haloperidol  lactate **AND** diphenhydrAMINE  **AND** LORazepam , hydrOXYzine , magnesium  hydroxide, traZODone     --  The risks/benefits/side-effects/alternatives to medications were discussed in detail with the patient and time was given for questions. The patient consents to medication trials.                -- Metabolic profile and EKG monitoring obtained while on an atypical antipsychotic (CMP: WNL except  BUN: 22, Ca: 8.6, Tot Prot: 6.3,  CBC: WNL except Hem: 12.3, Eosin Abs: 0.9, UDS: Cocaine, THC Positive,  Lipid Panel (4/18): WNL, A1c(4/26): 5.0, TSH(4/26): 0.962, EKG: NSR w/ Sinus Arrhythmia w/ Qtc: 448)              -- Encouraged patient to participate in unit milieu and in scheduled group therapies              -- Short Term Goals: Ability to identify changes in lifestyle to reduce recurrence of condition will improve, Ability to verbalize feelings will improve, Ability to disclose and discuss suicidal ideas, Ability to demonstrate self-control will improve, Ability to identify and develop effective coping behaviors will improve, Ability to maintain clinical measurements within normal limits will improve,  Compliance with prescribed medications will improve, and Ability to identify triggers associated with substance abuse/mental health issues will improve             -- Long Term Goals: Improvement in symptoms so as ready for discharge   Safety and Monitoring:             -- Voluntary admission to inpatient psychiatric unit for safety, stabilization and treatment             -- Daily contact with patient to assess and evaluate symptoms and progress in treatment             -- Patient's case to be discussed in multi-disciplinary team meeting             -- Observation Level : q15 minute checks             -- Vital signs:  q12 hours             -- Precautions: suicide, elopement, and assault  Discharge Planning:              -- Social work and case management to assist with discharge planning and identification of hospital follow-up needs prior to discharge             -- Estimated LOS: 2-4 more days             -- Discharge Concerns: Need to establish a safety plan; Medication compliance and effectiveness             -- Discharge Goals: Return home with outpatient referrals for mental health follow-up including medication management/psychotherapy=   Starleen GORMAN Kitty, MD 12/28/2023, 1:32 PM

## 2023-12-28 NOTE — Progress Notes (Signed)
   12/28/23 2045  Psych Admission Type (Psych Patients Only)  Admission Status Voluntary  Psychosocial Assessment  Patient Complaints Isolation  Eye Contact Fair  Facial Expression Flat  Affect Anxious  Speech Soft  Interaction Guarded  Motor Activity Slow  Appearance/Hygiene Unremarkable  Behavior Characteristics Appropriate to situation  Mood Anxious  Aggressive Behavior  Effect No apparent injury  Thought Process  Coherency WDL  Content Paranoia  Delusions Paranoid  Perception WDL  Hallucination None reported or observed  Judgment WDL  Confusion WDL  Danger to Self  Current suicidal ideation? Denies  Danger to Others  Danger to Others None reported or observed  Danger to Others Abnormal  Harmful Behavior to others No threats or harm toward other people  Destructive Behavior No threats or harm toward property

## 2023-12-28 NOTE — Group Note (Signed)
 Recreation Therapy Group Note   Group Topic:Team Building  Group Date: 12/28/2023 Start Time: 0930 End Time: 1000 Facilitators: Laylonie Marzec-McCall, LRT,CTRS Location: [address] Dayroom   Group Topic: Communication, Team Building, Problem Solving  Goal Area(s) Addresses:  Patient will effectively work with peer towards shared goal.  Patient will identify skills used to make activity successful.  Patient will identify how skills used during activity can be used to reach post d/c goals.   Behavioral Response:   Intervention: STEM Activity  Activity: Straw Bridge. In teams of 3-5, patients were given 15 plastic drinking straws and an equal length of masking tape. Using the materials provided, patients were instructed to build a free standing bridge-like structure to suspend an everyday item (ex: puzzle box) off of the floor or table surface. All materials were required to be used by the team in their design. LRT facilitated post-activity discussion reviewing team process. Patients were encouraged to reflect how the skills used in this activity can be generalized to daily life post discharge.   Education: Pharmacist, community, Scientist, physiological, Discharge Planning   Education Outcome: Acknowledges education/In group clarification offered/Needs additional education.    Affect/Mood: N/A   Participation Level: Did not attend    Clinical Observations/Individualized Feedback:     Plan: Continue to engage patient in RT group sessions 2-3x/week.   Trixie Maclaren-McCall, LRT,CTRS  12/28/2023 1:10 PM

## 2023-12-28 NOTE — Plan of Care (Signed)
   Problem: Education: Goal: Emotional status will improve Outcome: Progressing Goal: Mental status will improve Outcome: Progressing   Problem: Activity: Goal: Sleeping patterns will improve Outcome: Progressing

## 2023-12-28 NOTE — BH IP Treatment Plan (Signed)
 Interdisciplinary Treatment and Diagnostic Plan Update  12/28/2023 Time of Session: 12:15 PM - UPDATE Aaron Rubio MRN: [id number]  Principal Diagnosis: Paranoid schizophrenia (HCC)  Secondary Diagnoses: Principal Problem:   Paranoid schizophrenia (HCC) Active Problems:   Polysubstance abuse (HCC)   MDD (major depressive disorder)   Current Medications:  Current Facility-Administered Medications  Medication Dose Route Frequency Provider Last Rate Last Admin   acetaminophen  (TYLENOL ) tablet 650 mg  650 mg Oral Q6H PRN Bobbitt, Shalon E, NP   650 mg at 12/27/23 1603   alum & mag hydroxide-simeth (MAALOX/MYLANTA) 200-200-20 MG/5ML suspension 30 mL  30 mL Oral Q4H PRN Bobbitt, Shalon E, NP       amLODipine  (NORVASC ) tablet 10 mg  10 mg Oral Daily Bobbitt, Shalon E, NP   10 mg at 12/28/23 9175   haloperidol  (HALDOL ) tablet 5 mg  5 mg Oral TID PRN Bobbitt, Shalon E, NP       And   diphenhydrAMINE  (BENADRYL ) capsule 50 mg  50 mg Oral TID PRN Bobbitt, Shalon E, NP       haloperidol  lactate (HALDOL ) injection 5 mg  5 mg Intramuscular TID PRN Bobbitt, Shalon E, NP       And   diphenhydrAMINE  (BENADRYL ) injection 50 mg  50 mg Intramuscular TID PRN Bobbitt, Shalon E, NP       And   LORazepam  (ATIVAN ) injection 2 mg  2 mg Intramuscular TID PRN Bobbitt, Shalon E, NP       haloperidol  lactate (HALDOL ) injection 10 mg  10 mg Intramuscular TID PRN Bobbitt, Shalon E, NP       And   diphenhydrAMINE  (BENADRYL ) injection 50 mg  50 mg Intramuscular TID PRN Bobbitt, Shalon E, NP       And   LORazepam  (ATIVAN ) injection 2 mg  2 mg Intramuscular TID PRN Bobbitt, Shalon E, NP       [START ON 12/29/2023] haloperidol  (HALDOL ) tablet 10 mg  10 mg Oral Daily Kennyth Starleen RAMAN, MD       haloperidol  (HALDOL ) tablet 20 mg  20 mg Oral QHS Kennyth Starleen RAMAN, MD       hydrochlorothiazide  (HYDRODIURIL ) tablet 12.5 mg  12.5 mg Oral Daily Pashayan, Alexander S, DO   12.5 mg at 12/28/23 9175   hydrOXYzine  (ATARAX )  tablet 25 mg  25 mg Oral TID PRN Bobbitt, Shalon E, NP   25 mg at 12/25/23 9089   magnesium  hydroxide (MILK OF MAGNESIA) suspension 30 mL  30 mL Oral Daily PRN Bobbitt, Shalon E, NP       mirtazapine  (REMERON ) tablet 15 mg  15 mg Oral QHS Pashayan, Alexander S, DO   15 mg at 12/27/23 2143   traZODone  (DESYREL ) tablet 50 mg  50 mg Oral QHS PRN Bobbitt, Shalon E, NP       Vitamin D  (Ergocalciferol ) (DRISDOL ) 1.25 MG (50000 UNIT) capsule 50,000 Units  50,000 Units Oral Daily Parker, Alvin S, MD   50,000 Units at 12/27/23 9177   PTA Medications: Medications Prior to Admission  Medication Sig Dispense Refill Last Dose/Taking   amLODipine  (NORVASC ) 10 MG tablet Take 1 tablet (10 mg total) by mouth daily. (Patient not taking: Reported on 12/23/2023) 30 tablet 0    gabapentin  (NEURONTIN ) 300 MG capsule Take 300 mg by mouth 2 (two) times daily.      naproxen  (EC NAPROSYN ) 500 MG EC tablet Take 500 mg by mouth 2 (two) times daily with a meal.      OLANZapine  (  ZYPREXA ) 15 MG tablet Take 2 tablets (30 mg total) by mouth at bedtime. (Patient not taking: Reported on 12/23/2023) 60 tablet 0    QUEtiapine  (SEROQUEL ) 200 MG tablet Take 200 mg by mouth 2 (two) times daily.       Patient Stressors:    Patient Strengths:    Treatment Modalities: Medication Management, Group therapy, Case management,  1 to 1 session with clinician, Psychoeducation, Recreational therapy.   Physician Treatment Plan for Primary Diagnosis: Paranoid schizophrenia (HCC) Long Term Goal(s): Improvement in symptoms so as ready for discharge   Short Term Goals: Ability to identify changes in lifestyle to reduce recurrence of condition will improve Ability to disclose and discuss suicidal ideas Ability to demonstrate self-control will improve Ability to identify and develop effective coping behaviors will improve Ability to maintain clinical measurements within normal limits will improve Compliance with prescribed medications will  improve Ability to identify triggers associated with substance abuse/mental health issues will improve  Medication Management: Evaluate patient's response, side effects, and tolerance of medication regimen.  Therapeutic Interventions: 1 to 1 sessions, Unit Group sessions and Medication administration.  Evaluation of Outcomes: Progressing  Physician Treatment Plan for Secondary Diagnosis: Principal Problem:   Paranoid schizophrenia (HCC) Active Problems:   Polysubstance abuse (HCC)   MDD (major depressive disorder)  Long Term Goal(s): Improvement in symptoms so as ready for discharge   Short Term Goals: Ability to identify changes in lifestyle to reduce recurrence of condition will improve Ability to disclose and discuss suicidal ideas Ability to demonstrate self-control will improve Ability to identify and develop effective coping behaviors will improve Ability to maintain clinical measurements within normal limits will improve Compliance with prescribed medications will improve Ability to identify triggers associated with substance abuse/mental health issues will improve     Medication Management: Evaluate patient's response, side effects, and tolerance of medication regimen.  Therapeutic Interventions: 1 to 1 sessions, Unit Group sessions and Medication administration.  Evaluation of Outcomes: Progressing   RN Treatment Plan for Primary Diagnosis: Paranoid schizophrenia (HCC) Long Term Goal(s): Knowledge of disease and therapeutic regimen to maintain health will improve  Short Term Goals: Ability to remain free from injury will improve, Ability to verbalize frustration and anger appropriately will improve, Ability to verbalize feelings will improve, and Ability to disclose and discuss suicidal ideas  Medication Management: RN will administer medications as ordered by provider, will assess and evaluate patient's response and provide education to patient for prescribed  medication. RN will report any adverse and/or side effects to prescribing provider.  Therapeutic Interventions: 1 on 1 counseling sessions, Psychoeducation, Medication administration, Evaluate responses to treatment, Monitor vital signs and CBGs as ordered, Perform/monitor CIWA, COWS, AIMS and Fall Risk screenings as ordered, Perform wound care treatments as ordered.  Evaluation of Outcomes: Progressing   LCSW Treatment Plan for Primary Diagnosis: Paranoid schizophrenia (HCC) Long Term Goal(s): Safe transition to appropriate next level of care at discharge, Engage patient in therapeutic group addressing interpersonal concerns.  Short Term Goals: Engage patient in aftercare planning with referrals and resources, Increase ability to appropriately verbalize feelings, Facilitate acceptance of mental health diagnosis and concerns, and Identify triggers associated with mental health/substance abuse issues  Therapeutic Interventions: Assess for all discharge needs, 1 to 1 time with Social worker, Explore available resources and support systems, Assess for adequacy in community support network, Educate family and significant other(s) on suicide prevention, Complete Psychosocial Assessment, Interpersonal group therapy.  Evaluation of Outcomes: Progressing  Progress in Treatment: Attending groups:  attended some groups Participating in groups: Yes. Taking medication as prescribed: Yes. Toleration medication: Yes. Family/Significant other contact made: patient declined consents Patient understands diagnosis: Yes. Discussing patient identified problems/goals with staff: Yes. Medical problems stabilized or resolved: Yes. Denies suicidal/homicidal ideation: Yes. Issues/concerns per patient self-inventory: No.   New problem(s) identified: No, Describe:  none   New Short Term/Long Term Goal(s): medication stabilization, elimination of SI thoughts, development of comprehensive mental wellness plan.     Patient Goals:  Find acceptance with myself   Discharge Plan or Barriers: Patient recently admitted. CSW will continue to follow and assess for appropriate referrals and possible discharge planning.      Reason for Continuation of Hospitalization: Anxiety Delusions  Medication stabilization   Estimated Length of Stay: 2 - 4 days  Last 3 [place] Suicide Severity Risk Score: Flowsheet Row Ad[facility] (Current) [facility] from 12/23/2023 in [facility] Emergency Department at [facility] ED from 12/21/2023 in [facility]  C-SSRS RISK CATEGORY Error: Q7 should not be populated when Q6 is No High Risk Error: Q3, 4, or 5 should not be populated when Q2 is No    Last PHQ 2/9 Scores:     No data to display          Scribe for Treatment Team: Takeisha Cianci O Onetha Gaffey, LCSWA 12/28/2023 7:48 PM

## 2023-12-29 NOTE — Progress Notes (Signed)
   12/29/23 2200  Psych Admission Type (Psych Patients Only)  Admission Status Voluntary  Psychosocial Assessment  Patient Complaints Isolation  Eye Contact Fair  Facial Expression Flat  Affect Anxious;Flat  Speech Soft  Interaction Guarded  Motor Activity Slow  Appearance/Hygiene Unremarkable  Behavior Characteristics Appropriate to situation  Mood Anxious  Thought Process  Coherency WDL  Content WDL  Delusions None reported or observed  Perception WDL  Hallucination None reported or observed  Judgment WDL  Confusion None  Danger to Self  Current suicidal ideation? Denies (Denies)  Agreement Not to Harm Self Yes  Description of Agreement vebal

## 2023-12-29 NOTE — [facility] Group Notes (Signed)
 Psychoeducational Group Note  Date:  12/29/2023 Time:  2015  Group Topic/Focus:  Wrap up group  Participation Level: Did Not Attend  Participation Quality:  Not Applicable  Affect:  Not Applicable  Cognitive:  Not Applicable  Insight:  Not Applicable  Engagement in Group: Not Applicable  Additional Comments:  Did not attend.   Lenora Manuelita RAMAN 12/29/2023, 10:38 PM

## 2023-12-29 NOTE — Group Note (Signed)
 [facility] LCSW Group Therapy Note  @TD @  10:00-11:00AM Type of Therapy and Topic:  Group Therapy:  Safety Participation Level:  Did Not Attend   Description of Group:  Patients in this group were introduced the patient's safety;  the treatment  revolves around one central idea; you need to stay safe. The patient will learn on how to cope safely, no matter what negative life events come their way. The patient will identify methods on how they cope safely vs unsafe.  The patient will understand the three stages of healing: Safety, Mourning and Reconnection.  Patients discussed what additional healthy supports could be helpful in their recovery and wellness after discharge in order to prevent future hospitalizations.   An emphasis was placed on following up with the discharge plan when they leave the hospital to continue becoming healthier and happier.   Therapeutic Goals: 1)  Understand the patient's safety  2)  Stages of Safety  3)  Safe Vs. Unsafe   4) Support   Summary of Patient Progress:  NA  Therapeutic Modalities:   Psychoeducation Brief Solution-Focused Therapy  Aaron Rubio Nikelle Malatesta, LCSWA 12/29/2023  3:04 PM

## 2023-12-29 NOTE — Progress Notes (Signed)
   12/29/23 0905  Psych Admission Type (Psych Patients Only)  Admission Status Voluntary  Psychosocial Assessment  Patient Complaints Isolation  Eye Contact Fair  Facial Expression Flat  Affect Anxious;Flat  Speech Soft  Interaction Guarded  Motor Activity Slow  Appearance/Hygiene Unremarkable  Behavior Characteristics Appropriate to situation  Mood Anxious  Thought Process  Coherency WDL  Content WDL  Delusions None reported or observed  Perception WDL  Hallucination None reported or observed  Judgment WDL  Confusion None  Danger to Self  Current suicidal ideation? Denies  Agreement Not to Harm Self Yes  Description of Agreement Verbal  Danger to Others  Danger to Others None reported or observed  Danger to Others Abnormal  Harmful Behavior to others No threats or harm toward other people  Destructive Behavior No threats or harm toward property

## 2023-12-29 NOTE — Plan of Care (Signed)
  Problem: Health Behavior/Discharge Planning: Goal: Compliance with treatment plan for underlying cause of condition will improve Outcome: Progressing   Problem: Physical Regulation: Goal: Ability to maintain clinical measurements within normal limits will improve Outcome: Progressing   Problem: Safety: Goal: Periods of time without injury will increase Outcome: Progressing   

## 2023-12-29 NOTE — Progress Notes (Signed)
 [facility] MD Progress Note  12/29/2023 9:20 AM Aaron Rubio  MRN:  [id number] Subjective:   Aaron Rubio is a 43 yr old male who presented on 6/22 to [facility] reporting SI and with bizarre behavior, he was admitted to [facility] on 6/23. PPHx is significant for Paranoid Schizophrenia, Depression, Polysubstance Abuse (Cocaine, THC), and Medication Non-Compliance, and 1 Suicide Attempt (wrapped towel around neck 2008) and Multiple Prior Psychiatric Hospitalizations (last- [facility] 12/15/2023), and no history of Self Injurious Behavior.   The patient's chart was reviewed and nursing notes were reviewed. Significant vitals signs: BP 109/75. The patient's case was discussed in multidisciplinary team meeting. Per MAR, pt refused vitamin D  this AM but otherwise was taking medications appropriately . The following as needed medications were given: none. Per nursing, patient anxious and attended no group sessions.   Patient seen this morning in bed, no acute distress. He reports sleeping well and having poor appetite. He reports speaking with family on his mother's side. He reports attending some groups but when asked if he took anything away from the groups, he did not provide insight.  He states that the medications are stable now and he denies adverse effects.  He states that he has been homeless since 2019.  He denies current SI, HI, and AVH.  He states the last time having SI was last night when he wanted all to be over.  He states that after he prayed he felt better.  When asked about discharge plan, he states that he is wanting to go to [facility].   Principal Problem: Paranoid schizophrenia (HCC) Diagnosis: Principal Problem:   Paranoid schizophrenia (HCC) Active Problems:   Polysubstance abuse (HCC)   MDD (major depressive disorder)   Past Psychiatric History:  Paranoid Schizophrenia, Depression, Polysubstance Abuse (Cocaine, THC), and Medication Non-Compliance, and 1 Suicide Attempt (wrapped towel around neck  2008) and Multiple Prior Psychiatric Hospitalizations (last- [facility] 12/15/2023), and no history of Self Injurious Behavior.   Past Medical History:  Past Medical History:  Diagnosis Date   Hypertension    Schizophrenia (HCC)    History reviewed. No pertinent surgical history. Family History: History reviewed. No pertinent family history. Family Psychiatric  History:  Reports No Known Diagnosis', Substance Abuse, or Suicides   Social History:  Social History   Substance and Sexual Activity  Alcohol Use Yes     Social History   Substance and Sexual Activity  Drug Use Yes   Types: Cocaine, Marijuana    Social History   Socioeconomic History   Marital status: Single    Spouse name: Not on file   Number of children: Not on file   Years of education: Not on file   Highest education level: Not on file  Occupational History   Not on file  Tobacco Use   Smoking status: Every Day    Current packs/day: 0.30    Types: Cigarettes   Smokeless tobacco: Not on file  Vaping Use   Vaping status: Never Used  Substance and Sexual Activity   Alcohol use: Yes   Drug use: Yes    Types: Cocaine, Marijuana   Sexual activity: Not on file  Other Topics Concern   Not on file  Social History Narrative   Not on file   Social Drivers of Health   Financial Resource Strain: Not on file  Food Insecurity: Food Insecurity Present (12/24/2023)   Hunger Vital Sign    Worried About Running Out of Food in the Last Year: Sometimes  true    Ran Out of Food in the Last Year: Sometimes true  Transportation Needs: Unmet Transportation Needs (12/24/2023)   PRAPARE - Administrator, Civil Service (Medical): Yes    Lack of Transportation (Non-Medical): Yes  Physical Activity: Not on file  Stress: Not on file  Social Connections: Unknown (05/08/2022)   Received from [employer]   Social Network    Social Network: Not on file    Current Medications: Current Facility-Administered Medications   Medication Dose Route Frequency Provider Last Rate Last Admin   acetaminophen  (TYLENOL ) tablet 650 mg  650 mg Oral Q6H PRN Bobbitt, Shalon E, NP   650 mg at 12/27/23 1603   alum & mag hydroxide-simeth (MAALOX/MYLANTA) 200-200-20 MG/5ML suspension 30 mL  30 mL Oral Q4H PRN Bobbitt, Shalon E, NP       amLODipine  (NORVASC ) tablet 10 mg  10 mg Oral Daily Bobbitt, Shalon E, NP   10 mg at 12/29/23 0757   haloperidol  (HALDOL ) tablet 5 mg  5 mg Oral TID PRN Bobbitt, Shalon E, NP       And   diphenhydrAMINE  (BENADRYL ) capsule 50 mg  50 mg Oral TID PRN Bobbitt, Shalon E, NP       haloperidol  lactate (HALDOL ) injection 5 mg  5 mg Intramuscular TID PRN Bobbitt, Shalon E, NP       And   diphenhydrAMINE  (BENADRYL ) injection 50 mg  50 mg Intramuscular TID PRN Bobbitt, Shalon E, NP       And   LORazepam  (ATIVAN ) injection 2 mg  2 mg Intramuscular TID PRN Bobbitt, Shalon E, NP       haloperidol  lactate (HALDOL ) injection 10 mg  10 mg Intramuscular TID PRN Bobbitt, Shalon E, NP       And   diphenhydrAMINE  (BENADRYL ) injection 50 mg  50 mg Intramuscular TID PRN Bobbitt, Shalon E, NP       And   LORazepam  (ATIVAN ) injection 2 mg  2 mg Intramuscular TID PRN Bobbitt, Shalon E, NP       haloperidol  (HALDOL ) tablet 10 mg  10 mg Oral Daily Parker, Alvin S, MD   10 mg at 12/29/23 0757   haloperidol  (HALDOL ) tablet 20 mg  20 mg Oral QHS Parker, Alvin S, MD   20 mg at 12/28/23 2130   hydrochlorothiazide  (HYDRODIURIL ) tablet 12.5 mg  12.5 mg Oral Daily Pashayan, Alexander S, DO   12.5 mg at 12/29/23 0757   hydrOXYzine  (ATARAX ) tablet 25 mg  25 mg Oral TID PRN Bobbitt, Shalon E, NP   25 mg at 12/25/23 9089   magnesium  hydroxide (MILK OF MAGNESIA) suspension 30 mL  30 mL Oral Daily PRN Bobbitt, Shalon E, NP       mirtazapine  (REMERON ) tablet 15 mg  15 mg Oral QHS Pashayan, Alexander S, DO   15 mg at 12/28/23 2131   traZODone  (DESYREL ) tablet 50 mg  50 mg Oral QHS PRN Bobbitt, Shalon E, NP       Vitamin D  (Ergocalciferol )  (DRISDOL ) 1.25 MG (50000 UNIT) capsule 50,000 Units  50,000 Units Oral Daily Kennyth Starleen RAMAN, MD   50,000 Units at 12/27/23 9177    Lab Results:  No results found for this or any previous visit (from the past 48 hours).   Blood Alcohol level:  Lab Results  Component Value Date   [facility] <15 12/23/2023   [facility] <15 12/14/2023    Metabolic Disorder Labs: Lab Results  Component Value Date  HGBA1C 5.0 10/27/2023   MPG 96.8 10/27/2023   No results found for: PROLACTIN Lab Results  Component Value Date   CHOL 152 10/19/2023   TRIG 144 10/19/2023   HDL 57 10/19/2023   CHOLHDL 2.7 10/19/2023   VLDL 29 10/19/2023   LDLCALC 66 10/19/2023    Physical Findings: AIMS:  ,  ,  ,  ,  ,  ,   CIWA:    COWS:     Musculoskeletal: Strength & Muscle Tone: within normal limits Gait & Station: normal Patient leans: N/A  Psychiatric Specialty Exam:  Presentation  General Appearance:  Appropriate for Environment; Fairly Groomed  Eye Contact: Fair  Speech: Clear and Coherent; Slow  Speech Volume: Decreased  Handedness: Right   Mood and Affect  Mood: Depressed; Hopeless  Affect: Depressed; Congruent   Thought Process  Thought Processes: Coherent; Linear  Descriptions of Associations:Intact  Orientation:Full (Time, Place and Person)  Thought Content:Logical  History of Schizophrenia/Schizoaffective disorder:Yes  Duration of Psychotic Symptoms:Greater than six months  Hallucinations:Hallucinations: None   Ideas of Reference:None  Suicidal Thoughts:Suicidal Thoughts: No   Homicidal Thoughts:Homicidal Thoughts: No    Sensorium  Memory: Remote Good  Judgment: Fair  Insight: Shallow   Executive Functions  Concentration: Fair  Attention Span: Fair  Recall: Fair  Fund of Knowledge: Fair  Language: Fair   Psychomotor Activity  Psychomotor Activity: Psychomotor Activity: Normal    Assets  Assets: Resilience; Desire for  Improvement   Sleep  Sleep: Sleep: Fair     Physical Exam: Physical Exam Vitals reviewed.  Constitutional:      Appearance: Normal appearance.  HENT:     Head: Normocephalic and atraumatic.   Cardiovascular:     Rate and Rhythm: Normal rate.  Pulmonary:     Effort: Pulmonary effort is normal.   Neurological:     General: No focal deficit present.     Mental Status: He is alert.    Review of Systems  Respiratory:  Negative for cough and shortness of breath.   Cardiovascular:  Negative for chest pain.  Gastrointestinal:  Negative for abdominal pain, constipation, diarrhea, nausea and vomiting.  Neurological:  Negative for weakness and headaches.  Psychiatric/Behavioral:  Negative for depression and suicidal ideas. Hallucinations: AH- improving.The patient is not nervous/anxious.    Blood pressure 109/75, pulse 74, temperature 98.1 F (36.7 C), temperature source Oral, resp. rate 19, height 6' 2 (1.88 m), weight 104.8 kg, SpO2 98%. Body mass index is 29.66 kg/m.   Treatment Plan Summary: Daily contact with patient to assess and evaluate symptoms and progress in treatment and Medication management   Zaahir M. Kluesner is a 43 yr old male who presented on 6/22 to [facility] reporting SI and with bizarre behavior, he was admitted to [facility] on 6/23.  PPHx is significant for Paranoid Schizophrenia, Depression, Polysubstance Abuse (Cocaine, THC), and Medication Non-Compliance, and 1 Suicide Attempt (wrapped towel around neck 2008) and Multiple Prior Psychiatric Hospitalizations (last- [facility] 12/15/2023), and no history of Self Injurious Behavior.   PLAN - Continue Haldol  10 mg qam and 20 mg qpm for psychosis - Continue Remeron  15 mg at bedtime for depression and anxiety   --  The risks/benefits/side-effects/alternatives to medications were discussed in detail with the patient and time was given for questions. The patient consents to medication trials.                -- Metabolic profile  and EKG monitoring obtained while on an atypical antipsychotic (CMP: WNL  except  BUN: 22, Ca: 8.6, Tot Prot: 6.3,  CBC: WNL except Hem: 12.3, Eosin Abs: 0.9, UDS: Cocaine, THC Positive,  Lipid Panel (4/18): WNL, A1c(4/26): 5.0, TSH(4/26): 0.962, EKG: NSR w/ Sinus Arrhythmia w/ Qtc: 448)          Safety and Monitoring:             -- Voluntary admission to inpatient psychiatric unit for safety, stabilization and treatment             -- Daily contact with patient to assess and evaluate symptoms and progress in treatment             -- Patient's case to be discussed in multi-disciplinary team meeting             -- Observation Level : q15 minute checks             -- Vital signs:  q12 hours             -- Precautions: suicide, elopement, and assault  Discharge Planning:              -- Social work and case management to assist with discharge planning and identification of hospital follow-up needs prior to discharge             -- Estimated LOS: 2-4 more days             -- Discharge Concerns: Need to establish a safety plan; Medication compliance and effectiveness             -- Discharge Goals: Return home with outpatient referrals for mental health follow-up including medication management/psychotherapy   Ismael KATHEE Franco, MD 12/29/2023, 9:20 AM

## 2023-12-29 NOTE — Plan of Care (Signed)
  Problem: Activity: Goal: Sleeping patterns will improve Outcome: Progressing   Problem: Coping: Goal: Ability to verbalize frustrations and anger appropriately will improve Outcome: Progressing Goal: Ability to demonstrate self-control will improve Outcome: Progressing   Problem: Safety: Goal: Periods of time without injury will increase Outcome: Progressing

## 2023-12-30 DIAGNOSIS — F1910 Other psychoactive substance abuse, uncomplicated: Secondary | ICD-10-CM

## 2023-12-30 NOTE — Progress Notes (Signed)
 [facility] MD Progress Note  12/30/2023 9:27 AM Aaron Rubio  MRN:  [id number]  Subjective:   Aaron Rubio is a 43 yr old male who presented on 6/22 to [facility] reporting SI and with bizarre behavior, he was admitted to [facility] on 6/23. PPHx is significant for Paranoid Schizophrenia, Depression, Polysubstance Abuse (Cocaine, THC), and Medication Non-Compliance, and 1 Suicide Attempt (wrapped towel around neck 2008) and Multiple Prior Psychiatric Hospitalizations (last- [facility] 12/15/2023), and no history of Self Injurious Behavior.   24 hour chart review The patient's chart was reviewed and nursing notes were reviewed. Significant vitals signs: stable. The patient's case was discussed in multidisciplinary team meeting. Per MAR, patient refusing vitamin D  supplement but otherwise taking medications appropriately. The following as needed medications were given: Tylenol , Trazodone . Per nursing, patient is anxious and isolative and attended no documented group sessions.   The patient was seen in bed this morning, no acute distress. On assessment, the patient feels little bit down today. He denies something happening to cause this. Patient reports going to one group session and when I asked what he took away from the session, he states It neither helps me or hurts me, I just sit there.  Patient reports having good sleep.  Patient reports good appetite. Patient feels that the medications have been helpful with keeping him calm and denies adverse effects such as dystonia and akathesia.   Patient denies current SI, HI, AVH.    Principal Problem: Paranoid schizophrenia (HCC) Diagnosis: Principal Problem:   Paranoid schizophrenia (HCC) Active Problems:   Polysubstance abuse (HCC)   MDD (major depressive disorder)   Past Psychiatric History:  Paranoid Schizophrenia, Depression, Polysubstance Abuse (Cocaine, THC), and Medication Non-Compliance, and 1 Suicide Attempt (wrapped towel around neck 2008) and  Multiple Prior Psychiatric Hospitalizations (last- [facility] 12/15/2023), and no history of Self Injurious Behavior.   Past Medical History:  Past Medical History:  Diagnosis Date   Hypertension    Schizophrenia (HCC)    History reviewed. No pertinent surgical history.  Family History: History reviewed. No pertinent family history. Family Psychiatric  History:  Reports No Known Diagnosis', Substance Abuse, or Suicides   Social History:  Social History   Substance and Sexual Activity  Alcohol Use Yes     Social History   Substance and Sexual Activity  Drug Use Yes   Types: Cocaine, Marijuana    Social History   Socioeconomic History   Marital status: Single    Spouse name: Not on file   Number of children: Not on file   Years of education: Not on file   Highest education level: Not on file  Occupational History   Not on file  Tobacco Use   Smoking status: Every Day    Current packs/day: 0.30    Types: Cigarettes   Smokeless tobacco: Not on file  Vaping Use   Vaping status: Never Used  Substance and Sexual Activity   Alcohol use: Yes   Drug use: Yes    Types: Cocaine, Marijuana   Sexual activity: Not on file  Other Topics Concern   Not on file  Social History Narrative   Not on file   Social Drivers of Health   Financial Resource Strain: Not on file  Food Insecurity: Food Insecurity Present (12/24/2023)   Hunger Vital Sign    Worried About Running Out of Food in the Last Year: Sometimes true    Ran Out of Food in the Last Year: Sometimes true  Transportation Needs:  Unmet Transportation Needs (12/24/2023)   PRAPARE - Administrator, Civil Service (Medical): Yes    Lack of Transportation (Non-Medical): Yes  Physical Activity: Not on file  Stress: Not on file  Social Connections: Unknown (05/08/2022)   Received from [facility]   Social Network    Social Network: Not on file    Current Medications: Current Facility-Administered Medications   Medication Dose Route Frequency Provider Last Rate Last Admin   acetaminophen  (TYLENOL ) tablet 650 mg  650 mg Oral Q6H PRN Bobbitt, Shalon E, NP   650 mg at 12/29/23 1720   alum & mag hydroxide-simeth (MAALOX/MYLANTA) 200-200-20 MG/5ML suspension 30 mL  30 mL Oral Q4H PRN Bobbitt, Shalon E, NP       amLODipine  (NORVASC ) tablet 10 mg  10 mg Oral Daily Bobbitt, Shalon E, NP   10 mg at 12/30/23 9185   haloperidol  (HALDOL ) tablet 5 mg  5 mg Oral TID PRN Bobbitt, Shalon E, NP       And   diphenhydrAMINE  (BENADRYL ) capsule 50 mg  50 mg Oral TID PRN Bobbitt, Shalon E, NP       haloperidol  lactate (HALDOL ) injection 5 mg  5 mg Intramuscular TID PRN Bobbitt, Shalon E, NP       And   diphenhydrAMINE  (BENADRYL ) injection 50 mg  50 mg Intramuscular TID PRN Bobbitt, Shalon E, NP       And   LORazepam  (ATIVAN ) injection 2 mg  2 mg Intramuscular TID PRN Bobbitt, Shalon E, NP       haloperidol  lactate (HALDOL ) injection 10 mg  10 mg Intramuscular TID PRN Bobbitt, Shalon E, NP       And   diphenhydrAMINE  (BENADRYL ) injection 50 mg  50 mg Intramuscular TID PRN Bobbitt, Shalon E, NP       And   LORazepam  (ATIVAN ) injection 2 mg  2 mg Intramuscular TID PRN Bobbitt, Shalon E, NP       haloperidol  (HALDOL ) tablet 10 mg  10 mg Oral Daily Parker, Alvin S, MD   10 mg at 12/30/23 0813   haloperidol  (HALDOL ) tablet 20 mg  20 mg Oral QHS Parker, Alvin S, MD   20 mg at 12/29/23 2200   hydrochlorothiazide  (HYDRODIURIL ) tablet 12.5 mg  12.5 mg Oral Daily Pashayan, Alexander S, DO   12.5 mg at 12/30/23 0813   hydrOXYzine  (ATARAX ) tablet 25 mg  25 mg Oral TID PRN Bobbitt, Shalon E, NP   25 mg at 12/25/23 0910   magnesium  hydroxide (MILK OF MAGNESIA) suspension 30 mL  30 mL Oral Daily PRN Bobbitt, Shalon E, NP       mirtazapine  (REMERON ) tablet 15 mg  15 mg Oral QHS Pashayan, Alexander S, DO   15 mg at 12/29/23 2112   traZODone  (DESYREL ) tablet 50 mg  50 mg Oral QHS PRN Bobbitt, Shalon E, NP   50 mg at 12/29/23 2112    Vitamin D  (Ergocalciferol ) (DRISDOL ) 1.25 MG (50000 UNIT) capsule 50,000 Units  50,000 Units Oral Daily Parker, Alvin S, MD   50,000 Units at 12/27/23 9177    Lab Results:  No results found for this or any previous visit (from the past 48 hours).   Blood Alcohol level:  Lab Results  Component Value Date   [facility] <15 12/23/2023   [facility] <15 12/14/2023    Metabolic Disorder Labs: Lab Results  Component Value Date   HGBA1C 5.0 10/27/2023   MPG 96.8 10/27/2023   No results found for:  PROLACTIN Lab Results  Component Value Date   CHOL 152 10/19/2023   TRIG 144 10/19/2023   HDL 57 10/19/2023   CHOLHDL 2.7 10/19/2023   VLDL 29 10/19/2023   LDLCALC 66 10/19/2023    Physical Findings: AIMS:  ,  ,  ,  ,  ,  ,   CIWA:    COWS:     Musculoskeletal: Strength & Muscle Tone: within normal limits Gait & Station: normal Patient leans: N/A  Psychiatric Specialty Exam:  Presentation  General Appearance:  Fairly Groomed; Appropriate for Environment  Eye Contact: Fair  Speech: Clear and Coherent; Slow  Speech Volume: Decreased  Handedness: Right   Mood and Affect  Mood: Depressed  Affect: Congruent   Thought Process  Thought Processes: Coherent; Linear  Descriptions of Associations:Intact  Orientation:Full (Time, Place and Person)  Thought Content:Logical  History of Schizophrenia/Schizoaffective disorder:Yes  Duration of Psychotic Symptoms:Greater than six months  Hallucinations:Hallucinations: None   Ideas of Reference:None  Suicidal Thoughts:Suicidal Thoughts: No   Homicidal Thoughts:Homicidal Thoughts: No    Sensorium  Memory: Remote Good  Judgment: Fair  Insight: Lacking   Executive Functions  Concentration: Fair  Attention Span: Fair  Recall: Fair  Fund of Knowledge: Fair  Language: Fair   Psychomotor Activity  Psychomotor Activity: Psychomotor Activity: Normal    Assets  Assets: Resilience; Desire for  Improvement   Sleep  Sleep: Sleep: Fair   Physical Exam  General: Pleasant, well-appearing . No acute distress. Pulmonary: Normal effort. No wheezing or rales. Skin: No obvious rash or lesions. Neuro: A&Ox3.No focal deficit.   Review of Systems  No reported symptoms    Blood pressure 123/81, pulse 80, temperature 98.4 F (36.9 C), temperature source Oral, resp. rate 16, height 6' 2 (1.88 m), weight 104.8 kg, SpO2 99%. Body mass index is 29.66 kg/m.   Treatment Plan Summary: Daily contact with patient to assess and evaluate symptoms and progress in treatment and Medication management   Mayra M. Coon is a 43 yr old male who presented on 6/22 to [facility] reporting SI and with bizarre behavior, he was admitted to [facility] on 6/23.  PPHx is significant for Paranoid Schizophrenia, Depression, Polysubstance Abuse (Cocaine, THC), and Medication Non-Compliance, and 1 Suicide Attempt (wrapped towel around neck 2008) and Multiple Prior Psychiatric Hospitalizations (last- [facility] 12/15/2023), and no history of Self Injurious Behavior.   PLAN - Continue Haldol  10 mg qam and 20 mg qpm for psychosis - Continue Remeron  15 mg at bedtime for depression and anxiety  -Received Haldol  Decanoate LAI 200 mg on 6/27  --  The risks/benefits/side-effects/alternatives to medications were discussed in detail with the patient and time was given for questions. The patient consents to medication trials.                -- Metabolic profile and EKG monitoring obtained while on an atypical antipsychotic (CMP: WNL except  BUN: 22, Ca: 8.6, Tot Prot: 6.3,  CBC: WNL except Hem: 12.3, Eosin Abs: 0.9, UDS: Cocaine, THC Positive,  Lipid Panel (4/18): WNL, A1c(4/26): 5.0, TSH(4/26): 0.962, EKG: NSR w/ Sinus Arrhythmia w/ Qtc: 448)          Safety and Monitoring:             -- Voluntary admission to inpatient psychiatric unit for safety, stabilization and treatment             -- Daily contact with patient to assess and  evaluate symptoms and progress in treatment             --  Patient's case to be discussed in multi-disciplinary team meeting             -- Observation Level : q15 minute checks             -- Vital signs:  q12 hours             -- Precautions: suicide, elopement, and assault  Discharge Planning:              -- Social work and case management to assist with discharge planning and identification of hospital follow-up needs prior to discharge             -- Estimated LOS: 5-7 days             -- Discharge Concerns: Need to establish a safety plan; Medication compliance and effectiveness             -- Discharge Goals: Return home with outpatient referrals for mental health follow-up including medication management/psychotherapy   Ismael Franco, MD PGY-2 Psychiatry

## 2023-12-30 NOTE — Plan of Care (Signed)
   Problem: Coping: Goal: Ability to verbalize frustrations and anger appropriately will improve Outcome: Progressing Goal: Ability to demonstrate self-control will improve Outcome: Progressing

## 2023-12-30 NOTE — Progress Notes (Signed)
   12/30/23 1100  Psych Admission Type (Psych Patients Only)  Admission Status Voluntary  Psychosocial Assessment  Patient Complaints None  Eye Contact Fair  Facial Expression Flat  Affect Sad  Speech Soft  Interaction Guarded  Motor Activity Slow  Appearance/Hygiene Unremarkable  Behavior Characteristics Cooperative;Appropriate to situation  Mood Depressed  Thought Process  Coherency WDL  Content WDL  Delusions None reported or observed  Perception WDL  Hallucination None reported or observed  Judgment Poor  Confusion None  Danger to Self  Current suicidal ideation? Denies  Danger to Others  Danger to Others None reported or observed  Danger to Others Abnormal  Harmful Behavior to others No threats or harm toward other people

## 2023-12-30 NOTE — Group Note (Signed)
 Date:  12/30/2023 Time:  5:19 PM  Group Topic/Focus:  Goals Group:   The focus of this group is to help patients establish daily goals to achieve during treatment and discuss how the patient can incorporate goal setting into their daily lives to aide in recovery. Orientation:   The focus of this group is to educate the patient on the purpose and policies of crisis stabilization and provide a format to answer questions about their admission.  The group details unit policies and expectations of patients while admitted.    Participation Level:  Did Not Attend  Aaron Rubio Mars 12/30/2023, 5:19 PM

## 2023-12-30 NOTE — Plan of Care (Signed)

## 2023-12-31 DIAGNOSIS — F200 Paranoid schizophrenia: Secondary | ICD-10-CM | POA: Diagnosis not present

## 2023-12-31 NOTE — Group Note (Signed)
 Recreation Therapy Group Note   Group Topic:Stress Management  Group Date: 12/31/2023 Start Time: 0930 End Time: 0950 Facilitators: Kemo Spruce-McCall, LRT,CTRS Location: [address] Dayroom   Group Topic: Stress Management  Goal Area(s) Addresses:  Patient will identify positive stress management techniques. Patient will identify benefits of using stress management post d/c.  Behavioral Response:   Intervention: Meditation App  Activity: LRT played a meditation that focused on morning mindfulness, self-compassion and clarity. The meditation helped get patients to focus on being content in their inner peace and confidence.  Education:  Stress Management, Discharge Planning.   Education Outcome: Acknowledges Education   Affect/Mood: N/A   Participation Level: Did not attend    Clinical Observations/Individualized Feedback:    Plan: Continue to engage patient in RT group sessions 2-3x/week.   Adamae Ricklefs-McCall, LRT,CTRS 12/31/2023 1:05 PM

## 2023-12-31 NOTE — Progress Notes (Signed)
 [place] did not attend wrap up group.

## 2023-12-31 NOTE — Progress Notes (Signed)
 [facility] MD Progress Note  12/31/2023 10:01 AM Aaron Rubio  MRN:  [id number]  HPI:  Curvin Hunger. Nakamura is a 43 yr old male who presented on 6/22 to [facility] reporting SI and with bizarre behavior, he was admitted to [facility] on 6/23. PPHx is significant for Paranoid Schizophrenia, Depression, Polysubstance Abuse (Cocaine, THC), and Medication Non-Compliance, and 1 Suicide Attempt (wrapped towel around neck 2008) and Multiple Prior Psychiatric Hospitalizations (last- [facility] 12/15/2023), and no history of Self Injurious Behavior.   24 hour chart review The patient's chart was reviewed and nursing notes were reviewed. Significant vitals signs: stable. The patient's case was discussed in multidisciplinary team meeting. Per MAR, patient refusing vitamin D  supplement but otherwise taking medications appropriately. The following as needed medications were given: Tylenol , Trazodone . Per nursing, patient is anxious and isolative and attended no documented group sessions.   Upon approach, the patient was observed lying in bed. On today's assessment, he reports feeling down.  He rates his anxiety as 7/10 and depression is 8/10.  He acknowledges not attending group sessions but expresses openness to individual therapy.  Reports he did not eat breakfast due to oversleeping, but reports eating dinner last night. He consented to [facility] consult for spiritual and emotional support.  Patient denies suicidal ideation, intent or plan.  He denies homicidal ideation and any psychotic symptoms.  Appetite reported as fair.  Sleep reported as good.  Denies side effects from psychiatric medications.   Principal Problem: Paranoid schizophrenia (HCC) Diagnosis: Principal Problem:   Paranoid schizophrenia (HCC) Active Problems:   Polysubstance abuse (HCC)   MDD (major depressive disorder)   Past Psychiatric History:  Paranoid Schizophrenia, Depression, Polysubstance Abuse (Cocaine, THC), and Medication Non-Compliance, and 1  Suicide Attempt (wrapped towel around neck 2008) and Multiple Prior Psychiatric Hospitalizations (last- [facility] 12/15/2023), and no history of Self Injurious Behavior.   Past Medical History:  Past Medical History:  Diagnosis Date   Hypertension    Schizophrenia (HCC)    History reviewed. No pertinent surgical history.  Family History: History reviewed. No pertinent family history. Family Psychiatric  History:  Reports No Known Diagnosis', Substance Abuse, or Suicides   Social History:  Social History   Substance and Sexual Activity  Alcohol Use Yes     Social History   Substance and Sexual Activity  Drug Use Yes   Types: Cocaine, Marijuana    Social History   Socioeconomic History   Marital status: Single    Spouse name: Not on file   Number of children: Not on file   Years of education: Not on file   Highest education level: Not on file  Occupational History   Not on file  Tobacco Use   Smoking status: Every Day    Current packs/day: 0.30    Types: Cigarettes   Smokeless tobacco: Not on file  Vaping Use   Vaping status: Never Used  Substance and Sexual Activity   Alcohol use: Yes   Drug use: Yes    Types: Cocaine, Marijuana   Sexual activity: Not on file  Other Topics Concern   Not on file  Social History Narrative   Not on file   Social Drivers of Health   Financial Resource Strain: Not on file  Food Insecurity: Food Insecurity Present (12/24/2023)   Hunger Vital Sign    Worried About Running Out of Food in the Last Year: Sometimes true    Ran Out of Food in the Last Year: Sometimes true  Transportation Needs:  Unmet Transportation Needs (12/24/2023)   PRAPARE - Administrator, Civil Service (Medical): Yes    Lack of Transportation (Non-Medical): Yes  Physical Activity: Not on file  Stress: Not on file  Social Connections: Unknown (05/08/2022)   Received from [facility]   Social Network    Social Network: Not on file    Current  Medications: Current Facility-Administered Medications  Medication Dose Route Frequency Provider Last Rate Last Admin   acetaminophen  (TYLENOL ) tablet 650 mg  650 mg Oral Q6H PRN Bobbitt, Shalon E, NP   650 mg at 12/30/23 1656   alum & mag hydroxide-simeth (MAALOX/MYLANTA) 200-200-20 MG/5ML suspension 30 mL  30 mL Oral Q4H PRN Bobbitt, Shalon E, NP       amLODipine  (NORVASC ) tablet 10 mg  10 mg Oral Daily Bobbitt, Shalon E, NP   10 mg at 12/31/23 9160   haloperidol  (HALDOL ) tablet 5 mg  5 mg Oral TID PRN Bobbitt, Shalon E, NP       And   diphenhydrAMINE  (BENADRYL ) capsule 50 mg  50 mg Oral TID PRN Bobbitt, Shalon E, NP       haloperidol  lactate (HALDOL ) injection 5 mg  5 mg Intramuscular TID PRN Bobbitt, Shalon E, NP       And   diphenhydrAMINE  (BENADRYL ) injection 50 mg  50 mg Intramuscular TID PRN Bobbitt, Shalon E, NP       And   LORazepam  (ATIVAN ) injection 2 mg  2 mg Intramuscular TID PRN Bobbitt, Shalon E, NP       haloperidol  lactate (HALDOL ) injection 10 mg  10 mg Intramuscular TID PRN Bobbitt, Shalon E, NP       And   diphenhydrAMINE  (BENADRYL ) injection 50 mg  50 mg Intramuscular TID PRN Bobbitt, Shalon E, NP       And   LORazepam  (ATIVAN ) injection 2 mg  2 mg Intramuscular TID PRN Bobbitt, Shalon E, NP       haloperidol  (HALDOL ) tablet 10 mg  10 mg Oral Daily Parker, Alvin S, MD   10 mg at 12/31/23 9160   haloperidol  (HALDOL ) tablet 20 mg  20 mg Oral QHS Parker, Alvin S, MD   20 mg at 12/30/23 2111   hydrochlorothiazide  (HYDRODIURIL ) tablet 12.5 mg  12.5 mg Oral Daily Pashayan, Alexander S, DO   12.5 mg at 12/31/23 9161   hydrOXYzine  (ATARAX ) tablet 25 mg  25 mg Oral TID PRN Bobbitt, Shalon E, NP   25 mg at 12/25/23 0910   magnesium  hydroxide (MILK OF MAGNESIA) suspension 30 mL  30 mL Oral Daily PRN Bobbitt, Shalon E, NP       mirtazapine  (REMERON ) tablet 15 mg  15 mg Oral QHS Pashayan, Alexander S, DO   15 mg at 12/30/23 2111   traZODone  (DESYREL ) tablet 50 mg  50 mg Oral QHS PRN  Bobbitt, Shalon E, NP   50 mg at 12/30/23 2111   Vitamin D  (Ergocalciferol ) (DRISDOL ) 1.25 MG (50000 UNIT) capsule 50,000 Units  50,000 Units Oral Daily Parker, Alvin S, MD   50,000 Units at 12/27/23 9177    Lab Results:  No results found for this or any previous visit (from the past 48 hours).   Blood Alcohol level:  Lab Results  Component Value Date   [facility] <15 12/23/2023   [facility] <15 12/14/2023    Metabolic Disorder Labs: Lab Results  Component Value Date   HGBA1C 5.0 10/27/2023   MPG 96.8 10/27/2023   No results found for:  PROLACTIN Lab Results  Component Value Date   CHOL 152 10/19/2023   TRIG 144 10/19/2023   HDL 57 10/19/2023   CHOLHDL 2.7 10/19/2023   VLDL 29 10/19/2023   LDLCALC 66 10/19/2023    Physical Findings: AIMS:  ,  ,  ,  ,  ,  ,   CIWA:    COWS:     Musculoskeletal: Strength & Muscle Tone: within normal limits Gait & Station: normal Patient leans: N/A  Psychiatric Specialty Exam:  Presentation  General Appearance:  Fairly Groomed; Appropriate for Environment  Eye Contact: Fair  Speech: Clear and Coherent; Slow  Speech Volume: Decreased  Handedness: Right   Mood and Affect  Mood: Depressed  Affect: Congruent   Thought Process  Thought Processes: Coherent; Linear  Descriptions of Associations:Intact  Orientation:Full (Time, Place and Person)  Thought Content:Logical  History of Schizophrenia/Schizoaffective disorder:Yes  Duration of Psychotic Symptoms:Greater than six months  Hallucinations:Hallucinations: None   Ideas of Reference:None  Suicidal Thoughts:Suicidal Thoughts: No   Homicidal Thoughts:Homicidal Thoughts: No    Sensorium  Memory: Remote Good  Judgment: Fair  Insight: Lacking   Executive Functions  Concentration: Fair  Attention Span: Fair  Recall: Fair  Fund of Knowledge: Fair  Language: Fair   Psychomotor Activity  Psychomotor Activity: Psychomotor Activity:  Normal    Assets  Assets: Resilience; Desire for Improvement   Sleep  Sleep: Sleep: Fair   Physical Exam  General: Pleasant, well-appearing . No acute distress. Pulmonary: Normal effort. No wheezing or rales. Skin: No obvious rash or lesions. Neuro: A&Ox3.No focal deficit.   Review of Systems  No reported symptoms    Blood pressure 124/80, pulse 78, temperature 98.2 F (36.8 C), temperature source Oral, resp. rate 18, height 6' 2 (1.88 m), weight 104.8 kg, SpO2 99%. Body mass index is 29.66 kg/m.   Treatment Plan Summary: Daily contact with patient to assess and evaluate symptoms and progress in treatment and Medication management   Rush M. Dinovo is a 43 yr old male who presented on 6/22 to [facility] reporting SI and with bizarre behavior, he was admitted to [facility] on 6/23.  PPHx is significant for Paranoid Schizophrenia, Depression, Polysubstance Abuse (Cocaine, THC), and Medication Non-Compliance, and 1 Suicide Attempt (wrapped towel around neck 2008) and Multiple Prior Psychiatric Hospitalizations (last- [facility] 12/15/2023), and no history of Self Injurious Behavior.   Assessment: 6/30: Patient appears flat and disengaged.  He reports elevated anxiety and depression, with evident anergia and reduced participation in group sessions. To encourage participation and engagement, we will implement a therapeutic room lockout during scheduled group times. There are no active safety concerns. He continues to refuse vitamin D  supplementation, citing stomach upset is the reason.  Otherwise, he is compliant with psychiatric medication. Continue the current treatment plan at this.  PLAN - Continue Haldol  10 mg every morning and 20 mg nightly for psychosis - Continue Remeron  15 mg at bedtime for depression and anxiety  -Received Haldol  Decanoate LAI 200 mg on 6/27  --  The risks/benefits/side-effects/alternatives to medications were discussed in detail with the patient and time was given for  questions. The patient consents to medication trials.                -- Metabolic profile and EKG monitoring obtained while on an atypical antipsychotic (CMP: WNL except  BUN: 22, Ca: 8.6, Tot Prot: 6.3,  CBC: WNL except Hem: 12.3, Eosin Abs: 0.9, UDS: Cocaine, THC Positive,  Lipid Panel (4/18): WNL, A1c(4/26): 5.0,  TSH(4/26): 0.962, EKG: NSR w/ Sinus Arrhythmia w/ Qtc: 448)          Safety and Monitoring:             -- Voluntary admission to inpatient psychiatric unit for safety, stabilization and treatment             -- Daily contact with patient to assess and evaluate symptoms and progress in treatment             -- Patient's case to be discussed in multi-disciplinary team meeting             -- Observation Level : q15 minute checks             -- Vital signs:  q12 hours             -- Precautions: suicide, elopement, and assault  Discharge Planning:              -- Social work and case management to assist with discharge planning and identification of hospital follow-up needs prior to discharge             -- Estimated LOS: 5-7 days             -- Discharge Concerns: Need to establish a safety plan; Medication compliance and effectiveness             -- Discharge Goals: Return home with outpatient referrals for mental health follow-up including medication management/psychotherapy   Ismael Franco, MD PGY-2 Psychiatry  Patient ID: Tra Wilemon, male   DOB: 08-19-1980, 43 y.o.   MRN: [id number]

## 2023-12-31 NOTE — Progress Notes (Signed)
   12/30/23 2300  Psych Admission Type (Psych Patients Only)  Admission Status Voluntary  Psychosocial Assessment  Patient Complaints None  Eye Contact Fair  Facial Expression Animated  Affect Appropriate to circumstance  Speech Logical/coherent  Interaction Isolative  Motor Activity Slow  Appearance/Hygiene Unremarkable  Behavior Characteristics Appropriate to situation;Cooperative  Mood Pleasant  Thought Process  Coherency WDL  Content WDL  Delusions None reported or observed  Perception WDL  Hallucination None reported or observed  Judgment Poor  Confusion None  Danger to Self  Current suicidal ideation? Denies  Description of Suicide Plan none  Self-Injurious Behavior No self-injurious ideation or behavior indicators observed or expressed   Agreement Not to Harm Self Yes  Description of Agreement verbal  Danger to Others  Danger to Others None reported or observed  Danger to Others Abnormal  Harmful Behavior to others No threats or harm toward other people

## 2023-12-31 NOTE — Progress Notes (Signed)
 CSW spoke with patient regarding his interest in [facility] treatment for substance use. Patient confirmed his interest. CSW team will send referral to [facility] for treatment.   Alando Colleran, LCSWA 12/31/2023 8:52am

## 2023-12-31 NOTE — Progress Notes (Signed)
   12/31/23 1200  Psych Admission Type (Psych Patients Only)  Admission Status Voluntary  Psychosocial Assessment  Patient Complaints None  Eye Contact Fair  Facial Expression Flat  Affect Depressed  Speech Logical/coherent  Interaction Isolative  Motor Activity Slow  Appearance/Hygiene Unremarkable  Behavior Characteristics Appropriate to situation  Mood Depressed  Thought Process  Coherency WDL  Content WDL  Delusions None reported or observed  Perception WDL  Hallucination None reported or observed  Judgment Poor  Confusion None  Danger to Self  Current suicidal ideation? Denies  Danger to Others  Danger to Others None reported or observed  Danger to Others Abnormal  Harmful Behavior to others No threats or harm toward other people

## 2023-12-31 NOTE — BHH Group Notes (Signed)

## 2023-12-31 NOTE — Progress Notes (Incomplete)
 Patient is well-known to the team as he has had repeated admission here.  He presented to the emergency room a day after his previous discharge.  He was intoxicated with cocaine and THC at presentation.  He is now on long-acting injectable, he is contemplating in-house addiction treatment.  We will initiate therapeutic lockout see means of encouraging group activation.  We will maintain his current regimen.  Hopefully transition to [facility] later this week.

## 2023-12-31 NOTE — Group Note (Signed)
 Date:  12/31/2023 Time:  1:01 AM  Group Topic/Focus:  Wrap-Up Group:   The focus of this group is to help patients review their daily goal of treatment and discuss progress on daily workbooks.    Participation Level:  Did Not Attend  Additional Comments:    Kristen VEAR Gibbon 12/31/2023, 1:01 AM

## 2024-01-01 NOTE — Progress Notes (Signed)
   01/01/24 1500  Psych Admission Type (Psych Patients Only)  Admission Status Voluntary  Psychosocial Assessment  Patient Complaints None  Eye Contact Brief  Facial Expression Flat  Affect Flat  Speech Logical/coherent  Interaction Isolative  Motor Activity Slow  Appearance/Hygiene Unremarkable  Behavior Characteristics Unwilling to participate  Mood Pleasant  Thought Process  Coherency WDL  Content WDL  Delusions None reported or observed  Perception WDL  Hallucination None reported or observed  Judgment Impaired  Confusion None  Danger to Self  Current suicidal ideation? Denies  Danger to Others  Danger to Others None reported or observed  Danger to Others Abnormal  Harmful Behavior to others No threats or harm toward other people

## 2024-01-01 NOTE — [facility] Group Notes (Signed)
 [facility] Group Notes:  (Nursing/MHT/Case Management/Adjunct)  Date:  01/01/2024  Time:  10:50 PM  Type of Therapy:  Psychoeducational Skills  Participation Level:  Did Not Attend  Participation Quality:  Redirectable  Affect:  Resistant  Cognitive:  Lacking  Insight:  None  Engagement in Group:  None  Modes of Intervention:  Education  Summary of Progress/Problems: The patient did not attend group this evening.   Elizabeth Haff S 01/01/2024, 10:50 PM

## 2024-01-01 NOTE — Group Note (Signed)
 LCSW Group Therapy Note   Group Date: 01/01/2024 Start Time: 1100 End Time: 1200   Participation:  did not attend  Type of Therapy:  Group Therapy  Topic:  Stronger Together:  Building Healthy Relationships  Objective:  To explore loneliness, boundaries, and safe ways to build relationships.  Goals: Recognize healthy vs. unhealthy relationships. Learn safe ways to connect with others. Strengthen communication and [employer].  Summary:  Participants discussed loneliness, healthy connections, and setting boundaries. They explored safe ways to meet people and shared personal experiences. Key insights were reinforced through discussion and quotes.  Therapeutic Modalities Used: Cognitive Behavioral Therapy (CBT) Elements - Identifying unhealthy relationship patterns, challenging negative thoughts about connection. Dialectical Behavior Therapy (DBT) Elements - Interpersonal effectiveness, setting and maintaining boundaries. Supportive Group Therapy - Peer discussion, shared experiences, and emotional validation.   Emilija Bohman O Ava Deguire, LCSWA 01/01/2024  5:52 PM

## 2024-01-01 NOTE — Progress Notes (Signed)
 [facility] MD Progress Note  01/01/2024 3:33 PM Aaron Rubio  MRN:  [id number]  HPI:  Aaron Rubio is a 43 yr old male who presented on 6/22 to [facility] reporting SI and with bizarre behavior, he was admitted to [facility] on 6/23. PPHx is significant for Paranoid Schizophrenia, Depression, Polysubstance Abuse (Cocaine, THC), and Medication Non-Compliance, and 1 Suicide Attempt (wrapped towel around neck 2008) and Multiple Prior Psychiatric Hospitalizations (last- [facility] 12/15/2023), and no history of Self Injurious Behavior.   24 hour chart review The patient's chart was reviewed and nursing notes were reviewed. Significant vitals signs: stable. The patient's case was discussed in multidisciplinary team meeting. Per [facility], patient taking medications appropriately. The following as needed medications were given: Trazodone . Per nursing, patient is anxious and isolative and attended no documented group sessions.   Information obtained during today's assessment: On today's assessment, the patient reported being informed that he cannot access services at [facility] due to a lack of insurance coverage.  He expressed confusion, noting that he previously received services at the [facility] location in [place] in November 2024.  Patient stated that he would prefer to be discharged to [facility], where he has stayed before.  He reported that his mood is stable and has improved since admission.  He denied any feelings of sadness, depression, or hopelessness.  Anxiety symptoms are reported to be at a manageable level.  He described his sleep as stable and reported no concerns with concentration.  Energy level is adequate.  The patient denies suicidal thoughts, intent, or plan. He also denied any homicidal ideation, or psychotic symptoms. He denies side effects to psychiatric medications.    Discussed discharge planning and emphasized medication adherence and attendance at follow-up appointments to prevent  symptom exacerbation.   We discussed psychosocial stressors, including housing instability and financial strain. Discussed with the patient the critical importance of complete cessation of substances to support recovery, highlighting that abstaining can lead to improved  mood stability, enhanced cognitive function, and better sleep.  The case was discussed with the attending psychiatrist. Discharge is planned for Wednesday, July 1, pending continued stabilization.  Will continue to current treatment plan at this time.   Principal Problem: Paranoid schizophrenia (HCC) Diagnosis: Principal Problem:   Paranoid schizophrenia (HCC) Active Problems:   Polysubstance abuse (HCC)   MDD (major depressive disorder)   Past Psychiatric History:  Paranoid Schizophrenia, Depression, Polysubstance Abuse (Cocaine, THC), and Medication Non-Compliance, and 1 Suicide Attempt (wrapped towel around neck 2008) and Multiple Prior Psychiatric Hospitalizations (last- [facility] 12/15/2023), and no history of Self Injurious Behavior.   Past Medical History:  Past Medical History:  Diagnosis Date   Hypertension    Schizophrenia (HCC)    History reviewed. No pertinent surgical history.  Family History: History reviewed. No pertinent family history. Family Psychiatric  History:  Reports No Known Diagnosis', Substance Abuse, or Suicides   Social History:  Social History   Substance and Sexual Activity  Alcohol Use Yes     Social History   Substance and Sexual Activity  Drug Use Yes   Types: Cocaine, Marijuana    Social History   Socioeconomic History   Marital status: Single    Spouse name: Not on file   Number of children: Not on file   Years of education: Not on file   Highest education level: Not on file  Occupational History   Not on file  Tobacco Use   Smoking status: Every Day  Current packs/day: 0.30    Types: Cigarettes   Smokeless tobacco: Not on file  Vaping Use   Vaping status: Never  Used  Substance and Sexual Activity   Alcohol use: Yes   Drug use: Yes    Types: Cocaine, Marijuana   Sexual activity: Not on file  Other Topics Concern   Not on file  Social History Narrative   Not on file   Social Drivers of Health   Financial Resource Strain: Not on file  Food Insecurity: Food Insecurity Present (12/24/2023)   Hunger Vital Sign    Worried About Running Out of Food in the Last Year: Sometimes true    Ran Out of Food in the Last Year: Sometimes true  Transportation Needs: Unmet Transportation Needs (12/24/2023)   PRAPARE - Administrator, Civil Service (Medical): Yes    Lack of Transportation (Non-Medical): Yes  Physical Activity: Not on file  Stress: Not on file  Social Connections: Unknown (05/08/2022)   Received from [employer]   Social Network    Social Network: Not on file    Current Medications: Current Facility-Administered Medications  Medication Dose Route Frequency Provider Last Rate Last Admin   acetaminophen  (TYLENOL ) tablet 650 mg  650 mg Oral Q6H PRN Bobbitt, Shalon E, NP   650 mg at 12/30/23 1656   alum & mag hydroxide-simeth (MAALOX/MYLANTA) 200-200-20 MG/5ML suspension 30 mL  30 mL Oral Q4H PRN Bobbitt, Shalon E, NP       amLODipine  (NORVASC ) tablet 10 mg  10 mg Oral Daily Bobbitt, Shalon E, NP   10 mg at 01/01/24 9061   haloperidol  (HALDOL ) tablet 5 mg  5 mg Oral TID PRN Bobbitt, Shalon E, NP       And   diphenhydrAMINE  (BENADRYL ) capsule 50 mg  50 mg Oral TID PRN Bobbitt, Shalon E, NP       haloperidol  lactate (HALDOL ) injection 5 mg  5 mg Intramuscular TID PRN Bobbitt, Shalon E, NP       And   diphenhydrAMINE  (BENADRYL ) injection 50 mg  50 mg Intramuscular TID PRN Bobbitt, Shalon E, NP       And   LORazepam  (ATIVAN ) injection 2 mg  2 mg Intramuscular TID PRN Bobbitt, Shalon E, NP       haloperidol  lactate (HALDOL ) injection 10 mg  10 mg Intramuscular TID PRN Bobbitt, Shalon E, NP       And   diphenhydrAMINE  (BENADRYL )  injection 50 mg  50 mg Intramuscular TID PRN Bobbitt, Shalon E, NP       And   LORazepam  (ATIVAN ) injection 2 mg  2 mg Intramuscular TID PRN Bobbitt, Shalon E, NP       haloperidol  (HALDOL ) tablet 10 mg  10 mg Oral Daily Parker, Alvin S, MD   10 mg at 01/01/24 9061   haloperidol  (HALDOL ) tablet 20 mg  20 mg Oral QHS Parker, Alvin S, MD   20 mg at 12/31/23 2109   hydrochlorothiazide  (HYDRODIURIL ) tablet 12.5 mg  12.5 mg Oral Daily Pashayan, Alexander S, DO   12.5 mg at 01/01/24 9061   hydrOXYzine  (ATARAX ) tablet 25 mg  25 mg Oral TID PRN Bobbitt, Shalon E, NP   25 mg at 12/25/23 0910   magnesium  hydroxide (MILK OF MAGNESIA) suspension 30 mL  30 mL Oral Daily PRN Bobbitt, Shalon E, NP       mirtazapine  (REMERON ) tablet 15 mg  15 mg Oral QHS Pashayan, Alexander S, DO   15  mg at 12/31/23 2109   traZODone  (DESYREL ) tablet 50 mg  50 mg Oral QHS PRN Bobbitt, Shalon E, NP   50 mg at 12/31/23 2109   Vitamin D  (Ergocalciferol ) (DRISDOL ) 1.25 MG (50000 UNIT) capsule 50,000 Units  50,000 Units Oral Daily Parker, Alvin S, MD   50,000 Units at 01/01/24 9061    Lab Results:  No results found for this or any previous visit (from the past 48 hours).   Blood Alcohol level:  Lab Results  Component Value Date   [facility] <15 12/23/2023   ETH <15 12/14/2023    Metabolic Disorder Labs: Lab Results  Component Value Date   HGBA1C 5.0 10/27/2023   MPG 96.8 10/27/2023   No results found for: PROLACTIN Lab Results  Component Value Date   CHOL 152 10/19/2023   TRIG 144 10/19/2023   HDL 57 10/19/2023   CHOLHDL 2.7 10/19/2023   VLDL 29 10/19/2023   LDLCALC 66 10/19/2023    Physical Findings: AIMS:  ,  , 0 ,  ,  ,  ,   CIWA:   N/A COWS:   N/A  Musculoskeletal: Strength & Muscle Tone: within normal limits Gait & Station: normal Patient leans: N/A  Psychiatric Specialty Exam:  Presentation  General Appearance:  Appropriate for Environment; Fairly Groomed  Eye Contact: Good  Speech: Clear and  Coherent; Normal Rate  Speech Volume: Normal  Handedness: Right   Mood and Affect  Mood: -- (Stable)  Affect: Congruent   Thought Process  Thought Processes: Goal Directed; Linear  Descriptions of Associations:Intact  Orientation:Full (Time, Place and Person)  Thought Content:Logical  History of Schizophrenia/Schizoaffective disorder:Yes  Duration of Psychotic Symptoms:Greater than six months  Hallucinations:Hallucinations: None    Ideas of Reference:None  Suicidal Thoughts:Suicidal Thoughts: No    Homicidal Thoughts:Homicidal Thoughts: No     Sensorium  Memory: Immediate Good; Recent Good  Judgment: Fair  Insight: Fair   Art therapist  Concentration: Poor  Attention Span: Fair  Recall: Good  Fund of Knowledge: Fair  Language: Fair   Psychomotor Activity  Psychomotor Activity: Psychomotor Activity: Normal     Assets  Assets: Desire for Improvement; Resilience   Sleep  Sleep: Sleep: Fair    Physical Exam  General: Pleasant, well-appearing . No acute distress. Pulmonary: Normal effort. No wheezing or rales. Skin: No obvious rash or lesions. Neuro: A&Ox3.No focal deficit.   Review of Systems  No reported symptoms    Blood pressure 124/80, pulse 78, temperature 98.2 F (36.8 C), temperature source Oral, resp. rate 18, height 6' 2 (1.88 m), weight 104.8 kg, SpO2 99%. Body mass index is 29.66 kg/m.   Treatment Plan Summary: Daily contact with patient to assess and evaluate symptoms and progress in treatment and Medication management   Aaron Rubio is a 43 yr old male who presented on 6/22 to [facility] reporting SI and with bizarre behavior, he was admitted to [facility] on 6/23.  PPHx is significant for Paranoid Schizophrenia, Depression, Polysubstance Abuse (Cocaine, THC), and Medication Non-Compliance, and 1 Suicide Attempt (wrapped towel around neck 2008) and Multiple Prior Psychiatric Hospitalizations (last-  [facility] 12/15/2023), and no history of Self Injurious Behavior.   Assessment: 7/01: Patient remains compliant with his psychiatric medication regimen.  No overt psychotic symptoms were observed. Patient appears psychiatrically stable at baseline.  Per chart review - he received Haldol  Decanoate LAI 200 mg on 6/27.  He is scheduled for discharge to [facility], as per his request.  The current psychiatric  treatment regimen will be continued.   6/30: Patient appears flat and disengaged.  He reports elevated anxiety and depression, with evident anergia and reduced participation in group sessions. To encourage participation and engagement, we will implement a therapeutic room lockout during scheduled group times. There are no active safety concerns. He continues to refuse vitamin D  supplementation, citing stomach upset is the reason.  Otherwise, he is compliant with psychiatric medication. Continue the current treatment plan at this.  PLAN - Continue Haldol  10 mg every morning and 20 mg nightly for psychosis - Continue Remeron  15 mg at bedtime for depression and anxiety  -Received Haldol  Decanoate LAI 200 mg on 6/27  - Continue therapeutic room lock out   --  The risks/benefits/side-effects/alternatives to medications were discussed in detail with the patient and time was given for questions. The patient consents to medication trials.                -- Metabolic profile and EKG monitoring obtained while on an atypical antipsychotic (CMP: WNL except  BUN: 22, Ca: 8.6, Tot Prot: 6.3,  CBC: WNL except Hem: 12.3, Eosin Abs: 0.9, UDS: Cocaine, THC Positive,  Lipid Panel (4/18): WNL, A1c(4/26): 5.0, TSH(4/26): 0.962, EKG: NSR w/ Sinus Arrhythmia w/ Qtc: 448)          Safety and Monitoring:             -- Voluntary admission to inpatient psychiatric unit for safety, stabilization and treatment             -- Daily contact with patient to assess and evaluate symptoms and progress in treatment              -- Patient's case to be discussed in multi-disciplinary team meeting             -- Observation Level : q15 minute checks             -- Vital signs:  q12 hours             -- Precautions: suicide, elopement, and assault  Discharge Planning:              -- Social work and case management to assist with discharge planning and identification of hospital follow-up needs prior to discharge             -- Estimated LOS: 5-7 days             -- Discharge Concerns: Need to establish a safety plan; Medication compliance and effectiveness             -- Discharge Goals: Return home with outpatient referrals for mental health follow-up including medication management/psychotherapy   Aaron Franco, MD PGY-2 Psychiatry  Patient ID: Aaron Rubio, male   DOB: Jul 07, 1980, 43 y.o.   MRN: [id number] Patient ID: Aaron Rubio, male   DOB: 20-Dec-1980, 43 y.o.   MRN: [id number]

## 2024-01-01 NOTE — Group Note (Signed)
 Date:  01/01/2024 Time:  11:24 AM  Group Topic/Focus:  Goals Group:   The focus of this group is to help patients establish daily goals to achieve during treatment and discuss how the patient can incorporate goal setting into their daily lives to aide in recovery. Orientation:   The focus of this group is to educate the patient on the purpose and policies of crisis stabilization and provide a format to answer questions about their admission.  The group details unit policies and expectations of patients while admitted.    Participation Level:  Did Not Attend    Additional Comments:  Did not attend.  Aaron Rubio [name] 01/01/2024, 11:24 AM

## 2024-01-01 NOTE — Progress Notes (Incomplete)
 Patient is at his baseline.  No evidence of depression.  No overwhelming anxiety.  No evidence of mania.  No evidence of psychosis.  No desire to go into rehab.  We will continue his current regimen.  He is scheduled for discharge tomorrow.

## 2024-01-01 NOTE — Plan of Care (Signed)
   Problem: Education: Goal: Knowledge of [facility] Education information/materials will improve Outcome: Progressing Goal: Verbalization of understanding the information provided will improve Outcome: Progressing

## 2024-01-01 NOTE — Group Note (Signed)
 Recreation Therapy Group Note   Group Topic:Animal Assisted Therapy   Group Date: 01/01/2024 Start Time: 0946 End Time: 1029 Facilitators: Monseratt Ledin-McCall, LRT,CTRS Location: [address] Hall Dayroom   Animal-Assisted Activity (AAA) Program Checklist/Progress Notes Patient Eligibility Criteria Checklist & Daily Group note for Rec Tx Intervention  AAA/T Program Assumption of Risk Form signed by Patient/ or Parent Legal Guardian Yes  Patient understands his/her participation is voluntary Yes  Behavioral Response:    Education: Charity fundraiser, Appropriate Animal Interaction   Education Outcome: Acknowledges education.    Affect/Mood: N/A   Participation Level: Did not attend    Clinical Observations/Individualized Feedback:     Plan: Continue to engage patient in RT group sessions 2-3x/week.   Ebonie Westerlund-McCall, LRT,CTRS 01/01/2024 1:33 PM

## 2024-01-02 DIAGNOSIS — F200 Paranoid schizophrenia: Secondary | ICD-10-CM | POA: Diagnosis not present

## 2024-01-02 MED ORDER — HALOPERIDOL 20 MG PO TABS: 20.0000 mg | ORAL_TABLET | Freq: Every day | ORAL | 0 refills | Status: DC

## 2024-01-02 MED ORDER — MIRTAZAPINE 15 MG PO TABS: 15.0000 mg | ORAL_TABLET | Freq: Every day | ORAL | 0 refills | Status: DC

## 2024-01-02 MED ORDER — HYDROXYZINE HCL 25 MG PO TABS: 25.0000 mg | ORAL_TABLET | Freq: Three times a day (TID) | ORAL | 0 refills | Status: DC | PRN

## 2024-01-02 MED ORDER — HYDROCHLOROTHIAZIDE 12.5 MG PO TABS: 12.5000 mg | ORAL_TABLET | Freq: Every day | ORAL | 0 refills | Status: DC

## 2024-01-02 MED ORDER — HALOPERIDOL 10 MG PO TABS: 10.0000 mg | ORAL_TABLET | Freq: Every day | ORAL | 0 refills | Status: DC

## 2024-01-02 MED ORDER — TRAZODONE HCL 50 MG PO TABS: 50.0000 mg | ORAL_TABLET | Freq: Every evening | ORAL | 0 refills | Status: DC | PRN

## 2024-01-02 NOTE — Transportation (Signed)
 01/02/2024  Aaron Rubio DOB: [birth year] MRN: [id number]   RIDER WAIVER AND RELEASE OF LIABILITY  For the purposes of helping with transportation needs, [facility] partners with outside transportation providers (taxi companies, [place], Catering manager.) to give [place] patients or other approved people the choice of on-demand rides Public librarian) to our buildings for non-emergency visits.  By using [employer], I, the person signing this document, on behalf of myself and/or any legal minors (in my care using the [employer]), agree:  Science writer given to me are supplied by independent, outside transportation providers who do not work for, or have any affiliation with, [employer]. [place] is not a transportation company. [facility] has no control over the quality or safety of the rides I get using [employer]. [place] has no control over whether any outside ride will happen on time or not. [facility] gives no guarantee on the reliability, quality, safety, or availability on any rides, or that no mistakes will happen. I know and accept that traveling by vehicle (car, truck, SVU, fleeta, bus, taxi, etc.) has risks of serious injuries such as disability, being paralyzed, and death. I know and agree the risk of using [employer] is mine alone, and not [employer]. [employer] are provided as is and as are available. The transportation providers are in charge for all inspections and care of the vehicles used to provide these rides. I agree not to take legal action against [place], its agents, employees, officers, directors, representatives, insurers, attorneys, assigns, successors, subsidiaries, and affiliates at any time for any reasons related directly or indirectly to using [employer]. I also agree not to take legal action against  or its affiliates for any injury, death, or damage to property caused by or related to using  [employer]. I have read this Waiver and Release of Liability, and I understand the terms used in it and their legal meaning. This Waiver is freely and voluntarily given with the understanding that my right (or any legal minors) to legal action against  relating to [employer] is knowingly given up to use these services.   I attest that I read the Ride Waiver and Release of Liability to Aaron Rubio, gave Mr. [place] the opportunity to ask questions and answered the questions asked (if any). I affirm that Breion Novacek Kiger then provided consent for assistance with transportation.

## 2024-01-02 NOTE — BHH Suicide Risk Assessment (Addendum)
 [facility] Discharge Suicide Risk Assessment   Principal Problem: Paranoid schizophrenia [facility]) Discharge Diagnoses: Principal Problem:   Paranoid schizophrenia (HCC) Active Problems:   Polysubstance abuse (HCC)   MDD (major depressive disorder)   Total Time spent with patient: 20 minutes  Musculoskeletal: Strength & Muscle Tone: within normal limits Gait & Station: normal Patient leans: N/A  Psychiatric Specialty Exam  Presentation  General Appearance and behavior:  Casually dressed, not in any distress, appropriate behavior, engaged politely.  No EPS.  Eye Contact: Good.  Speech: Spontaneous.  Normal rate, tone and volume.  Normal prosody of speech.  Mood and Affect  Mood: Euthymic.  Affect: Full range and appropriate.  Thought Process  Thought Processes: Linear and goal directed.  Descriptions of Associations:Intact  Orientation:Full (Time, Place and Person)  Thought Content: Future oriented.  No current suicidal thoughts.  No homicidal thoughts.  No thoughts of violence.  No negative ruminative flooding.  No guilty ruminations.  No delusional theme.  No obsessions.  Hallucinations: No hallucination in any modality.  Sensorium  Memory: Good.  Judgment: Good.  Insight: Limited as he does not want to deal with his addiction.  Executive Functions  Concentration: Good.  Attention Span: Good.  Recall: Good.  Fund of Knowledge: Good.  Language: Good   Psychomotor Activity  Normal psychomotor activity   Physical Exam: Physical Exam ROS Blood pressure 124/80, pulse 78, temperature 98.2 F (36.8 C), temperature source Oral, resp. rate 18, height 6' 2 (1.88 m), weight 104.8 kg, SpO2 99%. Body mass index is 29.66 kg/m.  Mental Status Per Nursing Assessment::   On Admission:  NA  Demographic Factors:  Male, Adolescent or young adult, and Low socioeconomic status  Loss Factors: NA  Historical Factors: Prior suicide attempts, Family  history of mental illness or substance abuse, and Impulsivity  Risk Reduction Factors:   Positive therapeutic relationship and Positive coping skills or problem solving skills  Continued Clinical Symptoms:  Alcohol/Substance Abuse/Dependencies  Cognitive Features That Contribute To Risk:  None    Suicide Risk:  Minimal: No current suicidal thoughts.  No current homicidal thoughts.  No current thoughts of violence.  Patient has detoxed from psychoactive substances.  He is on long-acting injectable.  He does not have any motivation for addiction treatment.  Patient is aware of risk of accidental mental illness if he goes back on previously tolerated dose of opiates.  We will encourage him to abstain from psychoactive substances.  There are no new psychosocial stressors. At this point in time, patient is not a danger to himself or to others.  Patient is stable for care at the lower setting.  Follow-up Information     [facility] Schedule an appointment as soon as possible for a visit.   Why: Please call this provider if you wish to schedule an appointment for medication management services. Contact information: [address] [place] [address] [phone]         [facility]. Schedule an appointment as soon as possible for a visit.   Why: You may call this provider to schedule a hospital follow up appointment, for therapy and/or medication management services. Contact information: [address]  [address] [place] [address] [phone]         Services, [facility] Follow up.   Contact information: [address] [place] [address] [phone]                 Plan Of Care/Follow-up recommendations:  See discharge summary  Jerrell DELENA Forehand, MD 01/02/2024, 10:21 AM

## 2024-01-02 NOTE — Progress Notes (Signed)
 Pt has been discharged to the lobby with his taxi voucher. All belongings have been returned. Pt was explained discharge instructions and expressed understanding.

## 2024-01-02 NOTE — BHH Suicide Risk Assessment (Signed)
 BHH INPATIENT:  Patient Suicide Prevention Education  Suicide Prevention Education:  Education Completed;   Suicide Prevention Education was reviewed thoroughly with patient, including risk factors, warning signs, and what to do. Mobile Crisis services were described and that telephone number pointed out, with encouragement to patient to put this number in personal cell phone. Brochure was provided to patient to share with natural supports. Patient acknowledged the ways in which they are at risk, and how working through each of their issues can gradually start to reduce their risk factors. Patient was encouraged to think of the information in the context of people in their own lives. Patient denied having access to firearms Patient verbalized understanding of information provided. Patient endorsed a desire to live.    The suicide prevention education provided includes the following: Suicide risk factors Suicide prevention and interventions National Suicide Hotline telephone number [facility] assessment telephone number [facility] Emergency Assistance [address] [name] and/or Residential Mobile Crisis Unit telephone number    Aaron Rubio [place], [place] 01/02/24 12:07 PM

## 2024-01-02 NOTE — Progress Notes (Signed)
 [facility] Adult Case Management Discharge Plan : Will you be returning to the same living situation after discharge:  Yes,  chronically homeless At discharge, do you have transportation home?: Yes,  CSW arranged via [employer] Do you have the ability to pay for your medications: Yes,  active insurance  Release of information consent forms completed and in the chart;  Patient's signature needed at discharge.  Patient to Follow up at:  Follow-up Information     [facility] Schedule an appointment as soon as possible for a visit.   Why: Please call this provider if you wish to schedule an appointment for medication management services. Contact information: [address] [place] [address] [phone]         [facility]. Schedule an appointment as soon as possible for a visit.   Why: You may call this provider to schedule a hospital follow up appointment, for therapy and/or medication management services. Contact information: [address]  [address] [place] [address] [phone]         Services, [facility] Follow up.   Contact information: [address] [place] [address] [phone]                 Next level of care provider has access to [facility] Link:no  Safety Planning and Suicide Prevention discussed: Yes,  compelted w/ patient Has patient been referred to the Quitline?: Patient refused referral for treatment  Patient has been referred for addiction treatment: Patient refused referral for treatment; referral information given to patient at discharge.  Jahmeer Porche N [place], LCSW 01/02/2024, 12:17 PM

## 2024-01-02 NOTE — Progress Notes (Signed)
   01/02/24 0800  Psych Admission Type (Psych Patients Only)  Admission Status Voluntary  Psychosocial Assessment  Patient Complaints None  Eye Contact Fair  Facial Expression Flat  Affect Flat  Speech Logical/coherent  Interaction Minimal;Isolative  Motor Activity Other (Comment);Slow  Appearance/Hygiene Unremarkable  Behavior Characteristics Cooperative  Mood Anxious  Thought Process  Coherency WDL  Content WDL  Delusions None reported or observed  Perception WDL  Hallucination None reported or observed  Judgment Poor  Confusion None  Danger to Self  Current suicidal ideation? Denies  Agreement Not to Harm Self Yes  Description of Agreement Verbal  Danger to Others  Danger to Others None reported or observed  Danger to Others Abnormal  Harmful Behavior to others No threats or harm toward other people  Destructive Behavior No threats or harm toward property

## 2024-01-02 NOTE — Discharge Summary (Signed)
 Physician Discharge Summary Note  Patient:  Aaron Rubio is an 43 y.o., male MRN:  [id number] DOB:  Jun 20, 1981 Patient phone:  [phone] (home)  Patient address:   [place] [address],  Total Time spent with patient: 30 minutes  Date of Admission:  12/24/2023 Date of Discharge: 01/02/24   Reason for Admission:  Aaron Rubio is a 43 yr old male who presented on 6/22 to [facility] reporting SI and with bizarre behavior, he was admitted to [facility] on 6/23. PPHx is significant for Paranoid Schizophrenia, Depression, Polysubstance Abuse (Cocaine, THC), and Medication Non-Compliance, and 1 Suicide Attempt (wrapped towel around neck 2008) and Multiple Prior Psychiatric Hospitalizations (last- [facility] 12/15/2023), and no history of Self Injurious Behavior.   Principal Problem: Paranoid schizophrenia [facility]) Discharge Diagnoses: Principal Problem:   Paranoid schizophrenia (HCC) Active Problems:   Polysubstance abuse (HCC)   MDD (major depressive disorder)   Past Psychiatric History: See H&P  Past Medical History:  Past Medical History:  Diagnosis Date   Hypertension    Schizophrenia (HCC)    History reviewed. No pertinent surgical history. Family History: History reviewed. No pertinent family history. Family Psychiatric  History: See H&P Social History:  Social History   Substance and Sexual Activity  Alcohol Use Yes     Social History   Substance and Sexual Activity  Drug Use Yes   Types: Cocaine, Marijuana    Social History   Socioeconomic History   Marital status: Single    Spouse name: Not on file   Number of children: Not on file   Years of education: Not on file   Highest education level: Not on file  Occupational History   Not on file  Tobacco Use   Smoking status: Every Day    Current packs/day: 0.30    Types: Cigarettes   Smokeless tobacco: Not on file  Vaping Use   Vaping status: Never Used  Substance and Sexual Activity   Alcohol use: Yes   Drug  use: Yes    Types: Cocaine, Marijuana   Sexual activity: Not on file  Other Topics Concern   Not on file  Social History Narrative   Not on file   Social Drivers of Health   Financial Resource Strain: Not on file  Food Insecurity: Food Insecurity Present (12/24/2023)   Hunger Vital Sign    Worried About Running Out of Food in the Last Year: Sometimes true    Ran Out of Food in the Last Year: Sometimes true  Transportation Needs: Unmet Transportation Needs (12/24/2023)   PRAPARE - Administrator, Civil Service (Medical): Yes    Lack of Transportation (Non-Medical): Yes  Physical Activity: Not on file  Stress: Not on file  Social Connections: Unknown (05/08/2022)   Received from [facility]   Social Network    Social Network: Not on file    Hospital Course:  During the patient's hospitalization, patient had extensive initial psychiatric evaluation, and follow-up psychiatric evaluations every day.  Psychiatric diagnoses provided upon initial assessment: Principal Problem:   Paranoid schizophrenia (HCC) Active Problems:   Polysubstance abuse (HCC)   MDD (major depressive disorder)  Patient's psychiatric medications were adjusted on admission:  -Continue Zyprexa  30 mg QHS for psychosis -Start Remeron  7.5 mg QHS for depression and sleep  During the hospitalization, other adjustments were made to the patient's psychiatric medication regimen: -Discontinue Zyprexa   -Initiate Haldol  10 mg every morning and 20 mg nightly for psychosis -Increase Remeron  to 15 mg  QHS for depression and sleep -Received Haldol  Decanoate LAI 200 mg on 6/27  Patient's care was discussed during the interdisciplinary team meeting every day during the hospitalization.  The patient denies having side effects to prescribed psychiatric medication.  Gradually, patient started adjusting to milieu. The patient was evaluated each day by a clinical provider to ascertain response to treatment.  Improvement was noted by the patient's report of decreasing symptoms, improved sleep and appetite, affect, medication tolerance, behavior, and participation in unit programming.  Patient was asked each day to complete a self inventory noting mood, mental status, pain, new symptoms, anxiety and concerns.    Symptoms were reported as significantly decreased or resolved completely by discharge.   On day of discharge, the patient reports that their mood is stable. The patient denied having suicidal thoughts for more than 48 hours prior to discharge.  Patient denies having homicidal thoughts.  Patient denies having auditory hallucinations.  Patient denies any visual hallucinations or other symptoms of psychosis. The patient was motivated to continue taking medication with a goal of continued improvement in mental health.   The patient reports their target psychiatric symptoms of depression, insomnia, and psychosis responded well to the psychiatric medications, and the patient reports overall benefit other psychiatric hospitalization. Supportive psychotherapy was provided to the patient. The patient also participated in regular group therapy while hospitalized. Coping skills, problem solving as well as relaxation therapies were also part of the unit programming.  Labs were reviewed with the patient, and abnormal results were discussed with the patient.  The patient is able to verbalize their individual safety plan to this provider.  # It is recommended to the patient to continue psychiatric medications as prescribed, after discharge from the hospital.    # It is recommended to the patient to follow up with your outpatient psychiatric provider and PCP.  # It was discussed with the patient, the impact of alcohol, drugs, tobacco have been there overall psychiatric and medical wellbeing, and total abstinence from substance use was recommended the patient.ed.  # Prescriptions provided or sent directly to  preferred pharmacy at discharge. Patient agreeable to plan. Given opportunity to ask questions. Appears to feel comfortable with discharge.    # In the event of worsening symptoms, the patient is instructed to call the crisis hotline, 911 and or go to the nearest ED for appropriate evaluation and treatment of symptoms. To follow-up with primary care provider for other medical issues, concerns and or health care needs  # Patient was discharged to [facility] with a plan to follow up as noted below.   Physical Findings: AIMS:  , , 0 ,  ,  ,  ,   CIWA:   N/A COWS:   N/A  Musculoskeletal: Strength & Muscle Tone: within normal limits Gait & Station: normal Patient leans: N/A   Psychiatric Specialty Exam:  Presentation  General Appearance:  Appropriate for Environment; Fairly Groomed  Eye Contact: Good  Speech: Clear and Coherent; Normal Rate  Speech Volume: Normal  Handedness: Right   Mood and Affect  Mood: -- (Stable)  Affect: Congruent   Thought Process  Thought Processes: Goal Directed; Linear  Descriptions of Associations:Intact  Orientation:Full (Time, Place and Person)  Thought Content:Logical  History of Schizophrenia/Schizoaffective disorder:Yes  Duration of Psychotic Symptoms:Greater than six months  Hallucinations:Hallucinations: None  Ideas of Reference:None  Suicidal Thoughts:Suicidal Thoughts: No  Homicidal Thoughts:Homicidal Thoughts: No   Sensorium  Memory: Immediate Good; Recent Good  Judgment: Fair  Insight:  Fair   Art therapist  Concentration: Poor  Attention Span: Fair  Recall: [employer] of Knowledge: Fair  Language: Fair   Psychomotor Activity  Psychomotor Activity: Psychomotor Activity: Normal   Assets  Assets: Desire for Improvement; Resilience   Sleep  Sleep: Sleep: Fair  Estimated Sleeping Duration (Last 24 Hours): 9.00-9.50 hours   Physical Exam: Physical Exam Vitals  and nursing note reviewed.  Constitutional:      General: He is not in acute distress.    Appearance: Normal appearance.  HENT:     Nose: Nose normal.  Cardiovascular:     Rate and Rhythm: Normal rate.     Pulses: Normal pulses.  Pulmonary:     Effort: No respiratory distress.  Musculoskeletal:        General: Normal range of motion.     Cervical back: Normal range of motion.  Skin:    General: Skin is dry.  Neurological:     General: No focal deficit present.     Mental Status: He is alert and oriented to person, place, and time. Mental status is at baseline.  Psychiatric:        Mood and Affect: Mood normal.        Behavior: Behavior normal.        Thought Content: Thought content normal.        Judgment: Judgment normal.    Review of Systems  Constitutional: Negative.   HENT: Negative.    Respiratory: Negative.    Cardiovascular: Negative.   Gastrointestinal: Negative.   Skin: Negative.   Neurological: Negative.   Psychiatric/Behavioral:  Positive for depression and substance abuse. Negative for hallucinations, memory loss and suicidal ideas. The patient is nervous/anxious and has insomnia.    Blood pressure 124/80, pulse 78, temperature 98.2 F (36.8 C), temperature source Oral, resp. rate 18, height 6' 2 (1.88 m), weight 104.8 kg, SpO2 99%. Body mass index is 29.66 kg/m.   Social History   Tobacco Use  Smoking Status Every Day   Current packs/day: 0.30   Types: Cigarettes  Smokeless Tobacco Not on file   Tobacco Cessation:  N/A, patient does not currently use tobacco products   Blood Alcohol level:  Lab Results  Component Value Date   [facility] <15 12/23/2023   ETH <15 12/14/2023    Metabolic Disorder Labs:  Lab Results  Component Value Date   HGBA1C 5.0 10/27/2023   MPG 96.8 10/27/2023   No results found for: PROLACTIN Lab Results  Component Value Date   CHOL 152 10/19/2023   TRIG 144 10/19/2023   HDL 57 10/19/2023   CHOLHDL 2.7 10/19/2023    VLDL 29 10/19/2023   LDLCALC 66 10/19/2023    See Psychiatric Specialty Exam and Suicide Risk Assessment completed by Attending Physician prior to discharge.  Discharge destination:  Other:  [facility]   Is patient on multiple antipsychotic therapies at discharge:  No   Has Patient had three or more failed trials of antipsychotic monotherapy by history:  No  Recommended Plan for Multiple Antipsychotic Therapies: NA  Discharge Instructions     Diet - low sodium heart healthy   Complete by: As directed    Increase activity slowly   Complete by: As directed       Allergies as of 01/02/2024   No Known Allergies      Medication List     STOP taking these medications    amLODipine  10 MG tablet Commonly known as: NORVASC   gabapentin  300 MG capsule Commonly known as: NEURONTIN    naproxen  500 MG EC tablet Commonly known as: EC NAPROSYN    OLANZapine  15 MG tablet Commonly known as: ZYPREXA    QUEtiapine  200 MG tablet Commonly known as: SEROQUEL        TAKE these medications      Indication  haloperidol  20 MG tablet Commonly known as: HALDOL  Take 1 tablet (20 mg total) by mouth at bedtime.  Indication: Mood Control   haloperidol  10 MG tablet Commonly known as: HALDOL  Take 1 tablet (10 mg total) by mouth daily. Start taking on: January 03, 2024  Indication: Mood Control   hydrochlorothiazide  12.5 MG tablet Commonly known as: HYDRODIURIL  Take 1 tablet (12.5 mg total) by mouth daily. Start taking on: January 03, 2024  Indication: High Blood Pressure   hydrOXYzine  25 MG tablet Commonly known as: ATARAX  Take 1 tablet (25 mg total) by mouth 3 (three) times daily as needed for anxiety.  Indication: Feeling Anxious   mirtazapine  15 MG tablet Commonly known as: REMERON  Take 1 tablet (15 mg total) by mouth at bedtime.  Indication: Major Depressive Disorder   traZODone  50 MG tablet Commonly known as: DESYREL  Take 1 tablet (50 mg total) by mouth at bedtime as  needed for sleep.  Indication: Trouble Sleeping        Follow-up Information     [facility] Schedule an appointment as soon as possible for a visit.   Why: Please call this provider if you wish to schedule an appointment for medication management services. Contact information: [address] [place] [address] [phone]         [facility]. Schedule an appointment as soon as possible for a visit.   Why: You may call this provider to schedule a hospital follow up appointment, for therapy and/or medication management services. Contact information: [address]  [address] [place] [address] [phone]         Services, [facility] Follow up.   Contact information: [address] [place] [address] [phone]                 Follow-up recommendations:   Activity: as tolerated  Diet: heart healthy  Other: -Follow-up with your outpatient psychiatric provider -instructions on appointment date, time, and address (location) are provided to you in discharge paperwork.  -Take your psychiatric medications as prescribed at discharge - instructions are provided to you in the discharge paperwork  -Follow-up with outpatient primary care doctor and other specialists -for management of preventative medicine and chronic medical disease: Hypertension, Vitamin D  deficiency  -Testing: Follow-up with outpatient provider for abnormal lab results: Slightly low vitamin D  level  -If you are prescribed an atypical antipsychotic medication, we recommend that your outpatient psychiatrist follow routine screening for side effects within 3 months of discharge, including monitoring: AIMS scale, height, weight, blood pressure, fasting lipid panel, HbA1c, and fasting blood sugar.   -Recommend total abstinence from alcohol, tobacco, and other illicit drug use at discharge.   -If your psychiatric symptoms recur, worsen, or if you have side  effects to your psychiatric medications, call your outpatient psychiatric provider, 911, [phone] or go to the nearest emergency department.  -If suicidal thoughts occur, immediately call your outpatient psychiatric provider, 911, [phone] or go to the nearest emergency department.   Signed: Blair Chiquita Hint, NP 01/02/2024, 11:37 AM

## 2024-01-03 ENCOUNTER — Encounter (HOSPITAL_COMMUNITY): Payer: Self-pay

## 2024-01-03 ENCOUNTER — Emergency Department (HOSPITAL_COMMUNITY)
Admission: EM | Admit: 2024-01-03 | Discharge: 2024-01-04 | Disposition: A | Discharge status: Home / Self Care | Attending: Emergency Medicine | Admitting: Emergency Medicine

## 2024-01-03 ENCOUNTER — Other Ambulatory Visit: Payer: Self-pay

## 2024-01-03 DIAGNOSIS — Z5900 Homelessness unspecified: Secondary | ICD-10-CM | POA: Diagnosis not present

## 2024-01-03 DIAGNOSIS — M79672 Pain in left foot: Secondary | ICD-10-CM | POA: Diagnosis not present

## 2024-01-03 DIAGNOSIS — M545 Low back pain, unspecified: Secondary | ICD-10-CM | POA: Insufficient documentation

## 2024-01-03 DIAGNOSIS — M79671 Pain in right foot: Secondary | ICD-10-CM | POA: Insufficient documentation

## 2024-01-03 NOTE — ED Triage Notes (Signed)
 Pt BIB [facility] from street for chronic low back pain, BIL leg and foot pain. Pt states that he has been walking around x2 days and that has caused increased pain.

## 2024-01-04 MED ORDER — IBUPROFEN 800 MG PO TABS
800.0000 mg | ORAL_TABLET | Freq: Once | ORAL | Status: AC
  Administered 2024-01-04: 800 mg via ORAL
  Filled 2024-01-04: qty 1

## 2024-01-04 MED ORDER — LIDOCAINE 5 % EX PTCH
1.0000 | MEDICATED_PATCH | CUTANEOUS | Status: DC
  Administered 2024-01-04: 1 via TRANSDERMAL
  Filled 2024-01-04: qty 1

## 2024-01-04 NOTE — ED Notes (Signed)
 Patient given sandwich as requested.  Warm blanket provided.  Patient denies other needs at this time.  Patient going to sleep.

## 2024-01-04 NOTE — Discharge Instructions (Signed)
You were seen in the emergency department today for your back pain.  Your physical exam and vital signs are very reassuring.  The muscles in your low back are in what is called spasm, meaning they are inappropriately tightened up.  This can be quite painful.  To help with your pain you may take Tylenol and / or NSAID medication (such as ibuprofen or naproxen) to help with your pain.  You may also utilize topical pain relief such as Biofreeze, IcyHot, or topical lidocaine patches.  I also recommend that you apply heat to the area, such as a hot shower or heating pad, and follow heat application with massage of the muscles that are most tight.  Please return to the emergency department if you develop any numbness/tingling/weakness in your arms or legs, any difficulty urinating, or urinary incontinence chest pain, shortness of breath, abdominal pain, nausea or vomiting that does not stop, or any other new severe symptoms.

## 2024-01-04 NOTE — ED Notes (Signed)
 Patient requested another sandwich and two beverages.  Patient ambulated to restroom without difficulty and returned to bed.

## 2024-01-04 NOTE — ED Provider Notes (Signed)
 Provider Suicide Risk Assessment Note  C-SSRS RISK CATEGORY: Low Risk     Suicide Risk Assessment:   Based on my clinical evaluation, I estimate the patient to be at chronic low risk of self-harm in the current setting. This decision is based on my review of the chart including patient's history and current presentation, interview of the patient, mental status examination, and consideration of suicide risk including evaluating suicidal ideation, plan, intent, suicidal or self-harm behaviors, risk factors, and protective factors.  Patient has following modifiable risk factors for suicide: social isolation, medication noncompliance, lack of access to outpatient mental health resources, and active mental illness (to encompass adhd, tbi, mania, psychosis, trauma reaction) Patient has following non-modifiable or demographic risk factors for suicide:male gender, history of self harm behavior, and psychiatric hospitalization Patient has the following protective factors against suicide: Frustration tolerance Mitigation: Risk for suicide is being addressed by recommendations for: outpatient consult/treatment (low risk)   This chart was dictated using voice recognition software, Dragon. Despite the best efforts of this provider to proofread and correct errors, errors may still occur which can change documentation meaning.    Einer Meals R, PA-C 01/04/24 [id number]    Raford Lenis, MD 01/04/24 [phone]

## 2024-01-04 NOTE — ED Provider Notes (Signed)
 [place] EMERGENCY DEPARTMENT AT [facility] Provider Note   CSN: [id number] Arrival date & time: 01/03/24  2343     Patient presents with: Back Pain and Foot Pain   Aaron Rubio is a 43 y.o. male who presents to the ED for 17 visits in the last 3 months.  He presents today reporting bilateral leg and foot pain after walking my feet right for the last 2 days.  He also endorses some tightening sensation in the left lower back that makes it difficult for him to stand straight when he walks.  Patient does not endorse any suicidality or homicidality to this provider.  He does not appear to be responding to internal stimuli at this time.  Patient history of homelessness and paranoid schizophrenia with poor medication compliance.  No saddle anesthesia, no urinary fecal incontinence or retention, no weakness in the lower extremities.  HPI    Prior to Admission medications   Medication Sig Start Date End Date Taking? Authorizing Provider  haloperidol  (HALDOL ) 10 MG tablet Take 1 tablet (10 mg total) by mouth daily. 01/03/24   [name], [name], NP  haloperidol  (HALDOL ) 20 MG tablet Take 1 tablet (20 mg total) by mouth at bedtime. 01/02/24   [name], [name], NP  hydrochlorothiazide  (HYDRODIURIL ) 12.5 MG tablet Take 1 tablet (12.5 mg total) by mouth daily. 01/03/24   Blair, [name], NP  hydrOXYzine  (ATARAX ) 25 MG tablet Take 1 tablet (25 mg total) by mouth 3 (three) times daily as needed for anxiety. 01/02/24   [name], [name], NP  mirtazapine  (REMERON ) 15 MG tablet Take 1 tablet (15 mg total) by mouth at bedtime. 01/02/24   Blair, [name], NP  traZODone  (DESYREL ) 50 MG tablet Take 1 tablet (50 mg total) by mouth at bedtime as needed for sleep. 01/02/24   Blair Chiquita DEL, NP    Allergies: Patient has no known allergies.    Review of Systems  Musculoskeletal:  Positive for back pain.       Bilateral foot pain    Updated Vital Signs BP 118/89   Pulse (!) 115    Temp 98.3 F (36.8 C)   Resp 18   Ht 6' 2 (1.88 m)   Wt 98.5 kg   SpO2 100%   BMI 27.88 kg/m   Physical Exam Vitals and nursing note reviewed.  Constitutional:      Appearance: He is not ill-appearing or toxic-appearing.  HENT:     Head: Normocephalic and atraumatic.     Mouth/Throat:     Mouth: Mucous membranes are moist.     Pharynx: No oropharyngeal exudate or posterior oropharyngeal erythema.  Eyes:     General:        Right eye: No discharge.        Left eye: No discharge.     Conjunctiva/sclera: Conjunctivae normal.  Cardiovascular:     Rate and Rhythm: Normal rate and regular rhythm.     Pulses: Normal pulses.     Heart sounds: Normal heart sounds. No murmur heard. Pulmonary:     Effort: Pulmonary effort is normal. No respiratory distress.     Breath sounds: Normal breath sounds. No wheezing or rales.  Abdominal:     General: Bowel sounds are normal. There is no distension.     Palpations: Abdomen is soft.     Tenderness: There is no abdominal tenderness. There is no guarding or rebound.  Musculoskeletal:     Cervical back: Normal and  neck supple.     Thoracic back: Normal.     Lumbar back: Spasms and tenderness present. No bony tenderness. Negative right straight leg raise test and negative left straight leg raise test.       Back:     Right lower leg: No edema.     Left lower leg: No edema.  Feet:     Comments: Bilateral soles with calluses throughout but without acute ulcerations or maceration of the skin. Skin:    General: Skin is warm and dry.     Capillary Refill: Capillary refill takes less than 2 seconds.  Neurological:     General: No focal deficit present.     Mental Status: He is alert and oriented to person, place, and time. Mental status is at baseline.  Psychiatric:        Mood and Affect: Mood normal. Affect is flat.        Speech: Speech normal.        Behavior: Behavior is slowed. Behavior is cooperative.        Thought Content: Thought  content does not include homicidal or suicidal ideation.     Comments: Does not appear to be responding to internal stimuli at this time.     (all labs ordered are listed, but only abnormal results are displayed) Labs Reviewed - No data to display  EKG: None  Radiology: No results found.   Procedures   Medications Ordered in the ED  lidocaine  (LIDODERM ) 5 % 1 patch (1 patch Transdermal Patch Applied 01/04/24 0604)  ibuprofen  (ADVIL ) tablet 800 mg (800 mg Oral Given 01/04/24 0604)                                    Medical Decision Making 43 year old male who presents with concern for back pain and bilateral feet pain.  Context of homelessness and walking outside.  Patient is tachycardic on intake, however he is not tachycardic at time of my evaluation.  Physical exam is reassuring, patient does not have any midline tenderness ovation of the CVA, T, or L-spine.  Risk Prescription drug management.   Patient without recent instrumentation to the back, denies history of IV drug use, patient is afebrile.  On my exam and has clear spasm of the lumbar paraspinous musculature without midline tenderness palpation.  Clinical concern for emergent underlying injury or condition that would warrant further ED workup or patient management is exceedingly low.  Will treat supportively with oral analgesia and lidoderm .  Alyan voiced understanding of his medical evaluation and treatment plan. Each of their questions answered to their expressed satisfaction.  Return precautions were given.  Patient is well-appearing, stable, and was discharged in good condition.  This chart was dictated using voice recognition software, Dragon. Despite the best efforts of this provider to proofread and correct errors, errors may still occur which can change documentation meaning.      Final diagnoses:  Acute left-sided low back pain without sciatica    ED Discharge Orders     None           Bobette Pleasant JONELLE DEVONNA 01/04/24 9373    Raford Lenis, MD 01/04/24 [phone]

## 2024-01-05 ENCOUNTER — Other Ambulatory Visit: Payer: Self-pay

## 2024-01-05 ENCOUNTER — Encounter (HOSPITAL_COMMUNITY): Payer: Self-pay | Admitting: Emergency Medicine

## 2024-01-05 ENCOUNTER — Emergency Department (HOSPITAL_COMMUNITY)
Admission: EM | Admit: 2024-01-05 | Discharge: 2024-01-07 | Disposition: A | Discharge status: Home / Self Care | Attending: Emergency Medicine | Admitting: Emergency Medicine

## 2024-01-05 DIAGNOSIS — F1914 Other psychoactive substance abuse with psychoactive substance-induced mood disorder: Secondary | ICD-10-CM | POA: Insufficient documentation

## 2024-01-05 DIAGNOSIS — I10 Essential (primary) hypertension: Secondary | ICD-10-CM | POA: Insufficient documentation

## 2024-01-05 DIAGNOSIS — R45851 Suicidal ideations: Secondary | ICD-10-CM | POA: Diagnosis not present

## 2024-01-05 DIAGNOSIS — F17210 Nicotine dependence, cigarettes, uncomplicated: Secondary | ICD-10-CM | POA: Diagnosis not present

## 2024-01-05 DIAGNOSIS — Z5900 Homelessness unspecified: Secondary | ICD-10-CM | POA: Diagnosis not present

## 2024-01-05 DIAGNOSIS — F419 Anxiety disorder, unspecified: Secondary | ICD-10-CM | POA: Diagnosis not present

## 2024-01-05 DIAGNOSIS — F200 Paranoid schizophrenia: Secondary | ICD-10-CM | POA: Diagnosis not present

## 2024-01-05 DIAGNOSIS — R4689 Other symptoms and signs involving appearance and behavior: Secondary | ICD-10-CM | POA: Diagnosis present

## 2024-01-05 LAB — RAPID URINE DRUG SCREEN, HOSP PERFORMED
Amphetamines: NOT DETECTED
Barbiturates: NOT DETECTED
Benzodiazepines: NOT DETECTED
Cocaine: POSITIVE — AB
Opiates: NOT DETECTED
Tetrahydrocannabinol: NOT DETECTED

## 2024-01-05 LAB — COMPREHENSIVE METABOLIC PANEL WITH GFR
ALT: 35 U/L (ref 0–44)
AST: 68 U/L — ABNORMAL HIGH (ref 15–41)
Albumin: 4.6 g/dL (ref 3.5–5.0)
Alkaline Phosphatase: 66 U/L (ref 38–126)
Anion gap: 15 (ref 5–15)
BUN: 28 mg/dL — ABNORMAL HIGH (ref 6–20)
CO2: 21 mmol/L — ABNORMAL LOW (ref 22–32)
Calcium: 9.9 mg/dL (ref 8.9–10.3)
Chloride: 100 mmol/L (ref 98–111)
Creatinine, Ser: 1.28 mg/dL — ABNORMAL HIGH (ref 0.61–1.24)
GFR, Estimated: >60 mL/min (ref >60)
Glucose, Bld: 118 mg/dL — ABNORMAL HIGH (ref 70–99)
Potassium: 3.5 mmol/L (ref 3.5–5.1)
Sodium: 136 mmol/L (ref 135–145)
Total Bilirubin: 1.3 mg/dL — ABNORMAL HIGH (ref 0.0–1.2)
Total Protein: 8 g/dL (ref 6.5–8.1)

## 2024-01-05 LAB — CBC
HCT: 44.9 % (ref 39.0–52.0)
Hemoglobin: 14.2 g/dL (ref 13.0–17.0)
MCH: 26 pg (ref 26.0–34.0)
MCHC: 31.6 g/dL (ref 30.0–36.0)
MCV: 82.2 fL (ref 80.0–100.0)
Platelets: 291 K/uL (ref 150–400)
RBC: 5.46 MIL/uL (ref 4.22–5.81)
RDW: 13.5 % (ref 11.5–15.5)
WBC: 14.5 K/uL — ABNORMAL HIGH (ref 4.0–10.5)
nRBC: 0 % (ref 0.0–0.2)

## 2024-01-05 LAB — ETHANOL: Alcohol, Ethyl (B): <15 mg/dL (ref <15)

## 2024-01-05 MED ORDER — HALOPERIDOL 5 MG PO TABS
10.0000 mg | ORAL_TABLET | Freq: Every day | ORAL | Status: DC
  Administered 2024-01-05 – 2024-01-07 (×3): 10 mg via ORAL
  Filled 2024-01-05 (×3): qty 2

## 2024-01-05 MED ORDER — MIRTAZAPINE 7.5 MG PO TABS
15.0000 mg | ORAL_TABLET | Freq: Every day | ORAL | Status: DC
  Administered 2024-01-06: 15 mg via ORAL
  Filled 2024-01-05: qty 2

## 2024-01-05 MED ORDER — HYDROCHLOROTHIAZIDE 12.5 MG PO TABS
12.5000 mg | ORAL_TABLET | Freq: Every day | ORAL | Status: DC
  Administered 2024-01-05 – 2024-01-07 (×3): 12.5 mg via ORAL
  Filled 2024-01-05 (×3): qty 1

## 2024-01-05 NOTE — ED Provider Notes (Signed)
 [facility]-EMERGENCY DEPT [facility] Emergency Department Provider Note MRN:  [id number]  Arrival date & time: 01/05/24     Chief Complaint   Suicidal   History of Present Illness   Aaron Rubio is a 43 y.o. year-old male with a history of schizophrenia presenting to the ED with chief complaint of suicidal.  Worsening auditory hallucinations.  Thinking about killing himself.  Has not had his medications in a long time.  Review of Systems  A thorough review of systems was obtained and all systems are negative except as noted in the HPI and PMH.   Patient's Health History    Past Medical History:  Diagnosis Date   Hypertension    Schizophrenia (HCC)     History reviewed. No pertinent surgical history.  History reviewed. No pertinent family history.  Social History   Socioeconomic History   Marital status: Single    Spouse name: Not on file   Number of children: Not on file   Years of education: Not on file   Highest education level: Not on file  Occupational History   Not on file  Tobacco Use   Smoking status: Every Day    Current packs/day: 0.30    Types: Cigarettes   Smokeless tobacco: Not on file  Vaping Use   Vaping status: Never Used  Substance and Sexual Activity   Alcohol use: Yes   Drug use: Yes    Types: Cocaine, Marijuana   Sexual activity: Not on file  Other Topics Concern   Not on file  Social History Narrative   Not on file   Social Drivers of Health   Financial Resource Strain: Not on file  Food Insecurity: Food Insecurity Present (12/24/2023)   Hunger Vital Sign    Worried About Running Out of Food in the Last Year: Sometimes true    Ran Out of Food in the Last Year: Sometimes true  Transportation Needs: Unmet Transportation Needs (12/24/2023)   PRAPARE - Administrator, Civil Service (Medical): Yes    Lack of Transportation (Non-Medical): Yes  Physical Activity: Not on file  Stress: Not on file  Social Connections:  Unknown (05/08/2022)   [facility]   Social Network    Social Network: Not on file  Intimate Partner Violence: Not At Risk (12/24/2023)   Humiliation, Afraid, Rape, and Kick questionnaire    Fear of Current or Ex-Partner: No    Emotionally Abused: No    Physically Abused: No    Sexually Abused: No     Physical Exam   Vitals:   01/05/24 0446  BP: (!) 145/108  Pulse: (!) 116  Resp: 18  Temp: 98.3 F (36.8 C)  SpO2: 100%    CONSTITUTIONAL: Well-appearing, NAD NEURO/PSYCH: Awake and alert, seems disoriented with speech and processing EYES:  eyes equal and reactive ENT/NECK:  no LAD, no JVD CARDIO: Regular rate, well-perfused, normal S1 and S2 PULM:  CTAB no wheezing or rhonchi GI/GU:  non-distended, non-tender MSK/SPINE:  No gross deformities, no edema SKIN:  no rash, atraumatic   *Additional and/or pertinent findings included in MDM below  Diagnostic and Interventional Summary    EKG Interpretation Date/Time:    Ventricular Rate:    PR Interval:    QRS Duration:    QT Interval:    QTC Calculation:   R Axis:      Text Interpretation:         Labs Reviewed  COMPREHENSIVE METABOLIC PANEL WITH GFR -  Abnormal; Notable for the following components:      Result Value   CO2 21 (*)    Glucose, Bld 118 (*)    BUN 28 (*)    Creatinine, Ser 1.28 (*)    AST 68 (*)    Total Bilirubin 1.3 (*)    All other components within normal limits  CBC - Abnormal; Notable for the following components:   WBC 14.5 (*)    All other components within normal limits  ETHANOL  RAPID URINE DRUG SCREEN, HOSP PERFORMED    No orders to display    Medications - No data to display   Procedures  /  Critical Care Procedures  ED Course and Medical Decision Making  Initial Impression and Ddx Patient may be acutely psychotic, having worsening hallucinations and suicidal ideation.  Wants help, here voluntarily, will consult TTS after medical clearance  Past  medical/surgical history that increases complexity of ED encounter: Schizophrenia  Interpretation of Diagnostics I personally reviewed the Laboratory Testing and my interpretation is as follows: No significant blood count or electrolyte disturbance.    Patient Reassessment and Ultimate Disposition/Management     Medically cleared awaiting TTS recommendation.  Signed out to default provider.  Patient management required discussion with the following services or consulting groups:  None  Complexity of Problems Addressed Acute illness or injury that poses threat of life of bodily function  Additional Data Reviewed and Analyzed Further history obtained from: Prior ED visit notes and Prior labs/imaging results  Additional Factors Impacting ED Encounter Risk Consideration of hospitalization  Aaron HERO. Theadore, MD [facility] Emergency Medicine [facility] [email or web address]  Final Clinical Impressions(s) / ED Diagnoses     ICD-10-CM   1. Suicidal ideation  R45.851       ED Discharge Orders     None        Discharge Instructions Discussed with and Provided to Patient:   Discharge Instructions   None      Rubio Aaron HERO, MD 01/05/24 [phone]

## 2024-01-05 NOTE — ED Triage Notes (Signed)
 Pt arrives by [employer] with reported SI and hallucinations, states he also used cocaine, ETOH and THC tonight.

## 2024-01-05 NOTE — ED Notes (Signed)
 All belongings placed in pt belonging bag and into cabinet at nurses' station. Pt now in burgundy scrubs and wanded by security

## 2024-01-05 NOTE — ED Notes (Signed)
 Pt masturbating in hallway

## 2024-01-05 NOTE — BH Assessment (Addendum)
 At 21:12 patient was too sleepy to be assessed per NT [facility].  Pt had asked to be left alone.

## 2024-01-05 NOTE — ED Notes (Signed)
 Pt given dinner tray.

## 2024-01-06 DIAGNOSIS — F200 Paranoid schizophrenia: Secondary | ICD-10-CM | POA: Diagnosis not present

## 2024-01-06 MED ORDER — HALOPERIDOL 5 MG PO TABS
5.0000 mg | ORAL_TABLET | Freq: Every day | ORAL | Status: DC
  Administered 2024-01-06: 5 mg via ORAL
  Filled 2024-01-06: qty 1

## 2024-01-06 NOTE — BH Assessment (Addendum)
 Clinician inquired about whether patient was still asleep.  NT Faith said that patient was still asleep.

## 2024-01-06 NOTE — ED Provider Notes (Signed)
 Emergency Medicine Observation Re-evaluation Note  Aaron Rubio is a 43 y.o. male, seen on rounds today.  Pt initially presented to the ED for complaints of Suicidal Currently, the patient is asleep.  Pt presented to the ED yesterday with si.  TTS has still not evaluated him.  Physical Exam  BP (!) 120/94   Pulse 95   Temp 98.3 F (36.8 C) (Oral)   Resp 18   Wt 98.5 kg   SpO2 100%   BMI 27.88 kg/m  Physical Exam General: asleep Cardiac: rr Lungs: clear Psych: asleep  ED Course / MDM  EKG:   I have reviewed the labs performed to date as well as medications administered while in observation.  Recent changes in the last 24 hours include none.  Plan  Current plan is for TTS eval.    Dean Clarity, MD 01/06/24 [phone]

## 2024-01-06 NOTE — Consult Note (Addendum)
 [facility] Psychiatric Consult Follow-Up  Patient Name: .Aaron Rubio  MRN: [id number]  DOB: 05-15-1981  Consult Order details:  Orders (From admission, onward)     Start     Ordered   01/05/24 0610  CONSULT TO CALL ACT TEAM       Ordering Provider: Theadore Ozell HERO, MD  Provider:  (Not yet assigned)  Question:  Reason for Consult?  Answer:  Psych consult   01/05/24 0610             Mode of Visit: In person    Psychiatry Consult Evaluation  Service Date: January 06, 2024 LOS:  LOS: 0 days  Chief Complaint reported SI and hallucinations, states he also used cocaine   Primary Psychiatric Diagnoses  Paranoid schizophrenia 2.   Anxiety 3.   Substance induced mood disorder  Assessment  Aaron Rubio is a 43 y.o. male admitted: Presented to the ED on 01/05/2024  4:39 AM for SI and hallucinations, states he also used cocaine.  He carries the psychiatric diagnoses of schizophrenia and has a past medical history of chronic leg and back pain.    Patient was brought to the ED 48 hours ago for psychiatric observation due to recent cocaine use and concerns about erratic behavior. At the time of admission, patient exhibited disorganized speech and possible perceptual disturbances. Observation was initiated to allow substance to metabolize and reassess for psychiatric clearance. Patient is a homeless adult male with a history of substance use disorder, primarily cocaine. No consistent engagement with psychiatric care or primary health services. On initial presentation, patient was agitated and exhibiting paranoid behavior. Reported recent cocaine use. Over the course of 48 hours in the ED, patient's behavior has stabilized significantly. Patient is now oriented, calm, and cooperative. Denies any current suicidal ideation, homicidal ideation, or hallucinations. No evidence of ongoing psychosis or mania. Patient appears psychiatrically stable. Please see plan below for detailed  recommendations.   Diagnoses:  Active Hospital problems: Principal Problem:   Paranoid schizophrenia (HCC) Active Problems:   Homelessness    Plan   ## Psychiatric Medication Recommendations:  Recommend continuing patients home medication Referral to local substance use programs and shelters Crisis resources and mental health hotline provided Outpatient follow-up encouraged if patient engages with services   ## Medical Decision Making Capacity: Not specifically addressed in this encounter   ## Further Work-up:  -- No further workup needed at this time EKG or UDS -- most recent EKG on 12/26/2023 had QtC of 459 -- Pertinent labwork reviewed earlier this admission includes: CBC, EKG, UDS     ## Disposition:-- Patient is psychiatrically cleared. Patient case review and discussed with Dr. Merilee, and patient does not meet inpatient criteria for inpatient psychiatric treatment. At time of discharge, patient denies SI, HI, AVH and can contract for safety. He demonstrated no overt evidence of psychosis or mania. Prior to discharge, he verbalized that he understood warning signs, triggers, and symptoms of worsening mental health and how to access emergency mental health care if they felt it was needed. Patient was instructed to call 911 or return to the emergency room if they experienced any concerning symptoms after discharge. Discharged to community with support resources; patient declined shelter placement at this time.   ## Behavioral / Environmental: -To minimize splitting of staff, assign one staff person to communicate all information from the team when feasible. or Utilize compassion and acknowledge the patient's experiences while setting clear and realistic expectations for care.                ##  Safety and Observation Level:  - Based on my clinical evaluation, I estimate the patient to be at no risk of self harm in the current setting. - At this time, we recommend  routine. This  decision is based on my review of the chart including patient's history and current presentation, interview of the patient, mental status examination, and consideration of suicide risk including evaluating suicidal ideation, plan, intent, suicidal or self-harm behaviors, risk factors, and protective factors. This judgment is based on our ability to directly address suicide risk, implement suicide prevention strategies, and develop a safety plan while the patient is in the clinical setting. Please contact our team if there is a concern that risk level has changed.   CSSR Risk Category:C-SSRS RISK CATEGORY: Low Risk   Suicide Risk Assessment: Patient has following modifiable risk factors for suicide: medication noncompliance, which we are addressing by discharging to community with support resources; patient declined shelter placement at this time. Patient has following non-modifiable or demographic risk factors for suicide: male gender and psychiatric hospitalization Patient has the following protective factors against suicide: Supportive friends   Thank you for this consult request. Recommendations have been communicated to the primary team.  We will psychiatrically clear patient at this time  CATHALEEN ADAM, PMHNP       History of Present Illness  Relevant Aspects of Hospital ED Course:  Admitted on 01/05/2024 for auditory hallucinations and suicidal ideations.   Patient Report: Aaron Rubio, 43 y.o., male patient seen face to face by this provider, consulted with Dr. Sable; and chart reviewed on 01/06/24. Patient was brought to the ED 48 hours ago for psychiatric observation due to recent cocaine use and concerns about erratic behavior. At the time of admission, patient exhibited disorganized speech and possible perceptual disturbances. Observation was initiated to allow substance to metabolize and reassess for psychiatric clearance. Patient is a homeless adult male with a  history of substance use disorder, primarily cocaine. No consistent engagement with psychiatric care or primary health services. On initial presentation, patient was agitated and exhibiting paranoid behavior. Reported recent cocaine use. Over the course of 48 hours in the ED, patient's behavior has stabilized significantly. Patient is now oriented, calm, and cooperative. Denies any current suicidal ideation, homicidal ideation, or hallucinations. No evidence of ongoing psychosis or mania. Patient does not meet criteria for involuntary or voluntary inpatient psychiatric admission. Homelessness and lack of support remain ongoing psychosocial stressors. Provided education on the effects of cocaine and risks of recurrent use. Offered resources for substance use treatment and homeless shelters Provided information for crisis services and walk-in mental health clinics. Encouraged patient to engage with community outreach programs and case management if willing.   Psych ROS:  Depression: Denies Anxiety:  Denies Mania (lifetime and current): Denies  Psychosis: (lifetime and current): Lifetime, denies currently   Collateral information:  Contacted None, patient did not want family or friends called   Review of Systems  Psychiatric/Behavioral:  Positive for substance abuse.      Psychiatric and Social History  Psychiatric History:  Information collected from chart review   Prev Dx/Sx: Schizophrenia Current Psych Provider: None Home Meds (current): See above Previous Med Trials: Yes Therapy: None   Prior Psych Hospitalization: Yes Prior Self Harm: Unknown Prior Violence: Yes   Family Psych History: Unknown Family Hx suicide: Unknown   Social History:  Developmental Hx: Deferred Educational Hx: Unknown Occupational Hx: Unemployed Legal Hx: Unknown Living Situation: Homeless Spiritual Hx: Yes Access to weapons/lethal means:  Denies   Substance History Alcohol: Unknown   Tobacco:  Yes Illicit drugs: Yes Prescription drug abuse: Unknown Rehab hx: Unknown Exam Findings  Physical Exam:  Vital Signs:  Temp:  [98.3 F (36.8 C)-98.4 F (36.9 C)] 98.4 F (36.9 C) (07/06 1256) Pulse Rate:  [95-98] 98 (07/06 1256) Resp:  [18] 18 (07/06 1256) BP: (120-124)/(88-94) 124/88 (07/06 1256) SpO2:  [100 %] 100 % (07/06 1256) Blood pressure 124/88, pulse 98, temperature 98.4 F (36.9 C), temperature source Oral, resp. rate 18, weight 98.5 kg, SpO2 100%. Body mass index is 27.88 kg/m.  Physical Exam Neurological:     Mental Status: He is alert.  Psychiatric:        Attention and Perception: Attention normal.        Mood and Affect: Mood normal.        Speech: Speech normal.        Behavior: Behavior is cooperative.        Thought Content: Thought content normal.        Cognition and Memory: Memory normal.      Mental Status Exam: General Appearance: Casual  Orientation:  Other:  self and environment   Memory:  Immediate;   Fair Remote;   Fair  Concentration:  Concentration: Poor and Attention Span: Poor  Recall:  Fair  Attention  Fair  Eye Contact:  Fair  Speech: Coherent   Language:  Fair  Volume:  Normal  Mood: anxious  Affect:  Full Range  Thought Process:  WNL  Thought Content:  Denies  Suicidal Thoughts:  No  Homicidal Thoughts:  No  Judgement:  Fair  Insight:  Fair  Psychomotor Activity:  Normal  Akathisia:  NA  Fund of Knowledge:  NA    Assets:  Presenter, broadcasting Social Support  Cognition:  Intact  ADL's:  Impaired  AIMS (if indicated):        Other History   These have been pulled in through the EMR, reviewed, and updated if appropriate.  Family History:  The patient's family history is not on file.  Medical History: Past Medical History:  Diagnosis Date   Hypertension    Schizophrenia [facility])     Surgical History: History reviewed. No pertinent surgical history.   Medications:   Current Facility-Administered  Medications:    haloperidol  (HALDOL ) tablet 10 mg, 10 mg, Oral, Daily, Bero, Michael M, MD, 10 mg at 01/06/24 9066   haloperidol  (HALDOL ) tablet 5 mg, 5 mg, Oral, QHS, [name], Ibraheem Voris A, PMHNP   hydrochlorothiazide  (HYDRODIURIL ) tablet 12.5 mg, 12.5 mg, Oral, Daily, Bero, Michael M, MD, 12.5 mg at 01/06/24 0932   mirtazapine  (REMERON ) tablet 15 mg, 15 mg, Oral, QHS, Bero, Michael M, MD  Current Outpatient Medications:    haloperidol  (HALDOL ) 10 MG tablet, Take 1 tablet (10 mg total) by mouth daily. (Patient not taking: Reported on 01/05/2024), Disp: 30 tablet, Rfl: 0   haloperidol  (HALDOL ) 20 MG tablet, Take 1 tablet (20 mg total) by mouth at bedtime. (Patient not taking: Reported on 01/05/2024), Disp: 30 tablet, Rfl: 0   hydrochlorothiazide  (HYDRODIURIL ) 12.5 MG tablet, Take 1 tablet (12.5 mg total) by mouth daily. (Patient not taking: Reported on 01/05/2024), Disp: 30 tablet, Rfl: 0   hydrOXYzine  (ATARAX ) 25 MG tablet, Take 1 tablet (25 mg total) by mouth 3 (three) times daily as needed for anxiety. (Patient not taking: Reported on 01/05/2024), Disp: 30 tablet, Rfl: 0   mirtazapine  (REMERON ) 15 MG tablet, Take 1 tablet (15 mg total)  by mouth at bedtime. (Patient not taking: Reported on 01/05/2024), Disp: 30 tablet, Rfl: 0   traZODone  (DESYREL ) 50 MG tablet, Take 1 tablet (50 mg total) by mouth at bedtime as needed for sleep. (Patient not taking: Reported on 01/05/2024), Disp: 30 tablet, Rfl: 0  Allergies: No Known Allergies  Lisseth Brazeau [name], PMHNP

## 2024-01-06 NOTE — ED Notes (Addendum)
 Pt requested to shower. Pt provided towels and new scrubs for such.

## 2024-01-07 NOTE — Discharge Instructions (Addendum)
 Discharge recommendations:  Patient is to take medications as prescribed. Please see information for follow-up appointment with psychiatry and therapy. Please follow up with your primary care provider for all medical related needs.   Therapy: We recommend that patient participate in individual therapy to address mental health concerns.  Medications: The patient or guardian is to contact a medical professional and/or outpatient provider to address any new side effects that develop. The patient or guardian should update outpatient providers of any new medications and/or medication changes.   Atypical antipsychotics: If you are prescribed an atypical antipsychotic, it is recommended that your height, weight, BMI, blood pressure, fasting lipid panel, and fasting blood sugar be monitored by your outpatient providers.  Safety:  The patient should abstain from use of illicit substances/drugs and abuse of any medications. If symptoms worsen or do not continue to improve or if the patient becomes actively suicidal or homicidal then it is recommended that the patient return to the closest hospital emergency department, the [facility], or call 911 for further evaluation and treatment. National Suicide Prevention Lifeline 1-800-SUICIDE or (870)793-8940.  About 988 988 offers 24/7 access to trained crisis counselors who can help people experiencing mental health-related distress. People can call or text 988 or chat [email or web address] for themselves or if they are worried about a loved one who may need crisis support.  Crisis Mobile: [facility]:                     (908)652-6718 (for crisis response 24 hours a day) [facility] Hotline:                                            (234)178-4935   Substance Abuse Resources  [facility] - Admissions are currently completed Monday through Friday at 8am; both appointments and walk-ins are  accepted.  Any individual that is a [facility] resident may present for a substance abuse screening and assessment for admission.  A person may be referred by numerous sources or self-refer.   Potential clients will be screened for medical necessity and appropriateness for the program.  Clients must meet criteria for high-intensity residential treatment services.  If clinically appropriate, a client will continue with the comprehensive clinical assessment and intake process, as well as enrollment in the [facility].   Address: [address] [place], [place] [address] Admin Hours: Mon-Fri 8AM to [facility] Hours: 24/7 Phone: [phone] Fax: [phone]   [facility] (Det[facility]) Facility Based Crisis:  These are 3 locations for services: Please call before arrival    Address: [address] [place], [place] [address] Phone: [phone]   Address: [address] [place], [place] [address] Phone#: [phone]   Address: [address] [place], [place] [address] Phone#: [phone]     [facility] ([facility]): (offers outpatient therapy and intensive outpatient substance abuse therapy).  [address] [place], [place] [address] Phone: [phone]   [facility] : Offers FREE recovery skills classes, support groups, 1:1 Peer Support, and Compeer Classes. [address] [place], [place] [address] Phone: [phone] (Call to complete intake).  [facility] [address] [place] [address] [place], [place] [address] Phone: [phone] ext: [phone] The [facility] provides food, shelter and other programs and services to  the homeless men of [place] through our [employer].   By offering safe shelter, three meals a day, clean clothing, Biblical counseling, financial planning, vocational training, GED/education and employment assistance, we've helped mend the shattered lives of many homeless  men since opening in 1974.   We have approximately 267 beds available, with a max of 312 beds including mats for emergency situations and currently house an average of 270 men a night.   Prospective Client Check-In Information Photo ID Required (State/ Out of State/ [facility]) - if photo ID is not available, clients are required to have a printout of a police/sheriff's criminal history report. Help out with chores around the [facility]. No sex offender of any type (pending, charged, registered and/or any other sex related offenses) will be permitted to check in. Must be willing to abide by all rules, regulations, and policies established by the [employer]. The following will be provided - shelter, food, clothing, and biblical counseling. If you or someone you know is in need of assistance at our [facility] shelter in [place], [place], please call [phone] ext. [phone].   [facility]-will provide timely access to mental health services for children and adolescents (4-17) and adults presenting in a mental health crisis. The program is designed for those who need urgent Behavioral Health or Substance Use treatment and are not experiencing a medical crisis that would typically require an emergency room visit.    [address] [place], [place] [address] Phone: [phone] [email or web address]   [facility]: Phone#: [phone]   The [facility] SA Intensive Outpatient Program (SAIOP) means structured individual and group addiction activities and services that are provided at an outpatient program designed to assist adult and adolescent consumers to begin recovery and learn skills for recovery maintenance. The [employer] SAIOP program is offered at least 3 hours a day, 3 days a week.SAIOP services shall include a structured program consisting of, but not limited to, the following services: Individual counseling and support; Group  counseling and support; Family counseling, training or support; Biochemical assays to identify recent drug use (e.g., urine drug screens); Strategies for relapse prevention to include community and social support systems in treatment; Life skills; Crisis contingency planning; Disease Management; and Treatment support activities that have been adapted or specifically designed for persons with physical disabilities, or persons with co-occurring disorders of mental illness and substance abuse/dependence or mental retardation/developmental disability and substance abuse/dependence. Phone: [phone]    [facility] [address] [place], [place] [address] Phone: [phone] Admissions team is available 24/7  Phone: [phone]. Fax: [phone]   [facility]     Admissions [address], [place], [place] [address] [phone]    [facility] 24-Hour Call Center: [phone]  Behavioral Health Crisis Line: [phone]      [facility]) M-F 8am-3pm   [address]  [place], [place] [address]   [phone] Services include: laundry, barbering, support groups, case management, phone  & computer access, showers, AA/NA mtgs, mental health/substance abuse nurse, job skills class, disability information, VA assistance, spiritual classes, etc.  HOMELESS SHELTERS   [facility]                              [facility]    [address], [place]  [phone]                                                                                                                                        [facility] (women and children)                                                               [address] [place], [place] [address] [phone] [email or web address] for application and process Application Required   [facility]                 [address]                                 [place] [address]                                       [phone]                                                                                                                                                                                                     [facility] [address] [place], [place] [address] [phone] application appt.) Application Required   [employer] [facility] (women only)                           [address]  [place], [place] [address]                                      [phone]                                                   Intake starts 6pm daily Need valid ID, SSC, & Police report [employer] [address] [place], [place] [phone] Application Required   [facility] [facility] (men only)                                   [address]                                                 [place], [place]                                         [phone]                                                     [facility] (Pregnant women only) [address] [place], [place] [phone]   [facility]                                                   [address]                                                 [place], [place] [address]                               [phone]                                                                                                                            [facility] [address] [place], [place] [phone] 90 day commitment/SA/Application  process   [facility](men only)                                               [address]                                         [place], [place]                                         [phone]                                                                Check-in at [facility] [address] [place], [place] [address] [phone] Men/Women/Women and Children must be there by 7 pm   [facility] [place], [place] [phone]

## 2024-01-07 NOTE — ED Provider Notes (Signed)
 Emergency Medicine Observation Re-evaluation Note  Gable Guled Gahan is a 43 y.o. male, seen on rounds today.  Pt initially presented to the ED for complaints of Suicidal Currently, the patient is not having acute complaint.  Physical Exam  BP (!) 118/96 (BP Location: Right Arm)   Pulse 73   Temp 97.8 F (36.6 C) (Oral)   Resp 14   Wt 98.5 kg   SpO2 100%   BMI 27.88 kg/m  Physical Exam General: Resting comfortably in stretcher Lungs: Normal work of breathing Psych: Calm  ED Course / MDM  EKG:   I have reviewed the labs performed to date as well as medications administered while in observation.  Recent changes in the last 24 hours include seen by psychiatry recommends Haldol  5 mg at bedtime.  They recommend 48-hour monitoring and if stable can be discharged.  Plan  Current plan is for nightly Haldol  and 48-hour observation.    Yolande Lamar BROCKS, MD 01/07/24 [phone]

## 2024-01-07 NOTE — ED Notes (Signed)
 Patient discharged off unit to home per provider. Patient alert, calm, cooperative, no s/s of distress at time of discharge. Discharge information and belongings given to patient. Patient ambulated off unit, escorted by [facility]. Patient given bus pass for transportation.

## 2024-01-07 NOTE — ED Provider Notes (Signed)
 Jadeka [name] NP has reevaluated the patient.  Feels that he has been observed long enough psychiatrically cleared.  No more SI or HI.  Patient overall well-appearing.  Will follow-up with outpatient psychiatry.   Yolande Lamar BROCKS, MD 01/07/24 1239

## 2024-01-09 ENCOUNTER — Emergency Department (HOSPITAL_COMMUNITY)
Admission: EM | Admit: 2024-01-09 | Discharge: 2024-01-10 | Disposition: A | Discharge status: Home / Self Care | Attending: Emergency Medicine | Admitting: Emergency Medicine

## 2024-01-09 ENCOUNTER — Other Ambulatory Visit: Payer: Self-pay

## 2024-01-09 ENCOUNTER — Encounter (HOSPITAL_COMMUNITY): Payer: Self-pay | Admitting: Emergency Medicine

## 2024-01-09 DIAGNOSIS — F32A Depression, unspecified: Secondary | ICD-10-CM | POA: Diagnosis present

## 2024-01-09 DIAGNOSIS — R45851 Suicidal ideations: Secondary | ICD-10-CM | POA: Diagnosis not present

## 2024-01-09 DIAGNOSIS — Z765 Malingerer [conscious simulation]: Secondary | ICD-10-CM | POA: Diagnosis not present

## 2024-01-09 DIAGNOSIS — Z5900 Homelessness unspecified: Secondary | ICD-10-CM | POA: Insufficient documentation

## 2024-01-09 DIAGNOSIS — F1220 Cannabis dependence, uncomplicated: Secondary | ICD-10-CM | POA: Diagnosis not present

## 2024-01-09 DIAGNOSIS — R944 Abnormal results of kidney function studies: Secondary | ICD-10-CM | POA: Diagnosis not present

## 2024-01-09 DIAGNOSIS — F1420 Cocaine dependence, uncomplicated: Secondary | ICD-10-CM | POA: Diagnosis not present

## 2024-01-09 DIAGNOSIS — I10 Essential (primary) hypertension: Secondary | ICD-10-CM | POA: Diagnosis not present

## 2024-01-09 DIAGNOSIS — R441 Visual hallucinations: Secondary | ICD-10-CM | POA: Diagnosis present

## 2024-01-09 LAB — COMPREHENSIVE METABOLIC PANEL WITH GFR
ALT: 49 U/L — ABNORMAL HIGH (ref 0–44)
AST: 122 U/L — ABNORMAL HIGH (ref 15–41)
Albumin: 4.6 g/dL (ref 3.5–5.0)
Alkaline Phosphatase: 65 U/L (ref 38–126)
Anion gap: 12 (ref 5–15)
BUN: 24 mg/dL — ABNORMAL HIGH (ref 6–20)
CO2: 25 mmol/L (ref 22–32)
Calcium: 9.4 mg/dL (ref 8.9–10.3)
Chloride: 97 mmol/L — ABNORMAL LOW (ref 98–111)
Creatinine, Ser: 1.38 mg/dL — ABNORMAL HIGH (ref 0.61–1.24)
GFR, Estimated: >60 mL/min (ref >60)
Glucose, Bld: 110 mg/dL — ABNORMAL HIGH (ref 70–99)
Potassium: 3.2 mmol/L — ABNORMAL LOW (ref 3.5–5.1)
Sodium: 134 mmol/L — ABNORMAL LOW (ref 135–145)
Total Bilirubin: 1.1 mg/dL (ref 0.0–1.2)
Total Protein: 7.8 g/dL (ref 6.5–8.1)

## 2024-01-09 LAB — CBC
HCT: 42.9 % (ref 39.0–52.0)
Hemoglobin: 13.9 g/dL (ref 13.0–17.0)
MCH: 26.4 pg (ref 26.0–34.0)
MCHC: 32.4 g/dL (ref 30.0–36.0)
MCV: 81.6 fL (ref 80.0–100.0)
Platelets: 278 K/uL (ref 150–400)
RBC: 5.26 MIL/uL (ref 4.22–5.81)
RDW: 13.2 % (ref 11.5–15.5)
WBC: 12.7 K/uL — ABNORMAL HIGH (ref 4.0–10.5)
nRBC: 0 % (ref 0.0–0.2)

## 2024-01-09 LAB — ETHANOL: Alcohol, Ethyl (B): <15 mg/dL (ref <15)

## 2024-01-09 MED ORDER — HALOPERIDOL 5 MG PO TABS
10.0000 mg | ORAL_TABLET | Freq: Once | ORAL | Status: AC
  Administered 2024-01-10: 10 mg via ORAL
  Filled 2024-01-09: qty 2

## 2024-01-09 MED ORDER — TRAZODONE HCL 100 MG PO TABS
50.0000 mg | ORAL_TABLET | Freq: Every day | ORAL | Status: DC
  Administered 2024-01-10: 50 mg via ORAL
  Filled 2024-01-09: qty 1
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
## 2024-01-10 ENCOUNTER — Ambulatory Visit (HOSPITAL_COMMUNITY): Admission: EM | Admit: 2024-01-10 | Discharge: 2024-01-10 | Disposition: A | Discharge status: Home / Self Care

## 2024-01-10 ENCOUNTER — Encounter (HOSPITAL_COMMUNITY): Payer: Self-pay | Admitting: Emergency Medicine

## 2024-01-10 ENCOUNTER — Emergency Department (EMERGENCY_DEPARTMENT_HOSPITAL)
Admission: EM | Admit: 2024-01-10 | Discharge: 2024-01-11 | Disposition: A | Discharge status: Home / Self Care | Source: Home / Self Care | Attending: Emergency Medicine | Admitting: Emergency Medicine

## 2024-01-10 DIAGNOSIS — F32A Depression, unspecified: Secondary | ICD-10-CM | POA: Insufficient documentation

## 2024-01-10 DIAGNOSIS — I10 Essential (primary) hypertension: Secondary | ICD-10-CM | POA: Insufficient documentation

## 2024-01-10 DIAGNOSIS — Z765 Malingerer [conscious simulation]: Secondary | ICD-10-CM | POA: Insufficient documentation

## 2024-01-10 DIAGNOSIS — F1420 Cocaine dependence, uncomplicated: Secondary | ICD-10-CM | POA: Diagnosis present

## 2024-01-10 DIAGNOSIS — R944 Abnormal results of kidney function studies: Secondary | ICD-10-CM | POA: Insufficient documentation

## 2024-01-10 DIAGNOSIS — F1410 Cocaine abuse, uncomplicated: Secondary | ICD-10-CM | POA: Diagnosis present

## 2024-01-10 DIAGNOSIS — F209 Schizophrenia, unspecified: Secondary | ICD-10-CM | POA: Diagnosis not present

## 2024-01-10 DIAGNOSIS — R45851 Suicidal ideations: Secondary | ICD-10-CM

## 2024-01-10 DIAGNOSIS — Z5900 Homelessness unspecified: Secondary | ICD-10-CM | POA: Insufficient documentation

## 2024-01-10 DIAGNOSIS — F1220 Cannabis dependence, uncomplicated: Secondary | ICD-10-CM | POA: Diagnosis present

## 2024-01-10 DIAGNOSIS — R443 Hallucinations, unspecified: Secondary | ICD-10-CM

## 2024-01-10 LAB — COMPREHENSIVE METABOLIC PANEL WITH GFR
ALT: 43 U/L (ref 0–44)
AST: 84 U/L — ABNORMAL HIGH (ref 15–41)
Albumin: 4 g/dL (ref 3.5–5.0)
Alkaline Phosphatase: 79 U/L (ref 38–126)
Anion gap: 11 (ref 5–15)
BUN: 20 mg/dL (ref 6–20)
CO2: 22 mmol/L (ref 22–32)
Calcium: 9.1 mg/dL (ref 8.9–10.3)
Chloride: 101 mmol/L (ref 98–111)
Creatinine, Ser: 1.27 mg/dL — ABNORMAL HIGH (ref 0.61–1.24)
GFR, Estimated: >60 mL/min (ref >60)
Glucose, Bld: 103 mg/dL — ABNORMAL HIGH (ref 70–99)
Potassium: 3.6 mmol/L (ref 3.5–5.1)
Sodium: 134 mmol/L — ABNORMAL LOW (ref 135–145)
Total Bilirubin: 0.6 mg/dL (ref 0.0–1.2)
Total Protein: 6.6 g/dL (ref 6.5–8.1)

## 2024-01-10 LAB — CBC
HCT: 42.4 % (ref 39.0–52.0)
Hemoglobin: 13.3 g/dL (ref 13.0–17.0)
MCH: 26.3 pg (ref 26.0–34.0)
MCHC: 31.4 g/dL (ref 30.0–36.0)
MCV: 83.8 fL (ref 80.0–100.0)
Platelets: 280 K/uL (ref 150–400)
RBC: 5.06 MIL/uL (ref 4.22–5.81)
RDW: 13.2 % (ref 11.5–15.5)
WBC: 9.4 K/uL (ref 4.0–10.5)
nRBC: 0 % (ref 0.0–0.2)

## 2024-01-10 LAB — RAPID URINE DRUG SCREEN, HOSP PERFORMED
Amphetamines: NOT DETECTED
Barbiturates: NOT DETECTED
Benzodiazepines: NOT DETECTED
Cocaine: POSITIVE — AB
Opiates: NOT DETECTED
Tetrahydrocannabinol: POSITIVE — AB

## 2024-01-10 LAB — ETHANOL: Alcohol, Ethyl (B): <15 mg/dL (ref <15)

## 2024-01-10 NOTE — ED Provider Notes (Signed)
 Provider Suicide Risk Assessment Note  Nursing Documentation C-SSRS RISK CATEGORY: High Risk  ED Suicide Bundle Interventions (Must be higher or equal to C-SSRS Score): High Risk Interventions implemented 1:1 monitoring intiated  Suicide Risk Assessment:   Based on my clinical evaluation, I estimate the patient to be at chronic low risk of self-harm in the current setting. This decision is based on my review of the chart including patient's history and current presentation, interview of the patient, mental status examination, and consideration of suicide risk including evaluating suicidal ideation, plan, intent, suicidal or self-harm behaviors, risk factors, and protective factors.  Patient has following modifiable risk factors for suicide: active mental illness (to encompass adhd, tbi, mania, psychosis, trauma reaction), current symptoms: anxiety/panic, insomnia, impulsivity, anhedonia, hopelessness, triggering events, and recent psychiatric hospitalization Patient has following non-modifiable or demographic risk factors for suicide:male gender, history of self harm behavior, and psychiatric hospitalization Patient has the following protective factors against suicide: Access to outpatient mental health care and Cultural, spiritual, or religious beliefs that discourage suicide Mitigation: Risk for suicide is being addressed by recommendations for: outpatient consult/treatment (low risk)      Jerral Meth, MD 01/10/24 [phone]

## 2024-01-10 NOTE — ED Notes (Signed)
RN provided pt bag lunch

## 2024-01-10 NOTE — ED Notes (Signed)
Pt refused urine sample.

## 2024-01-10 NOTE — ED Notes (Signed)
 Discharged by provider

## 2024-01-10 NOTE — Progress Notes (Signed)
   01/10/24 0737  [facility] Triage Screening (Walk-ins at [facility] only)  How Did You Hear About Us ? Self  What Is the Reason for Your Visit/Call Today? Aaron Rubio presents to [facility] voluntarily. Pt was [facility] and recently discharged from [facility] on [facility]/24. Pt states that he is hurting himself in the street because he doesn't have proper shoes, clothing and no sleep. Pt states that he has been homeless since 2023 and is sleeping at bus stops. Pt states that he is diagnosed with schizophrenia and paranoia.  How Long Has This Been Causing You Problems? <Week  Have You Recently Had Any Thoughts About Hurting Yourself? No  Are You Planning to Commit Suicide/Harm Yourself At This time? No  Have you Recently Had Thoughts About Hurting Someone [name]? No  Are You Planning To Harm Someone At This Time? No  Physical Abuse Denies  Verbal Abuse Denies  Sexual Abuse Denies  Exploitation of patient/patient's resources Denies  Self-Neglect Yes, present (Comment)  Are you currently experiencing any auditory, visual or other hallucinations? No  Have You Used Any Alcohol or Drugs in the Past 24 Hours? No  Do you have any current medical co-morbidities that require immediate attention? No  Clinician description of patient physical appearance/behavior: no shoes, paper hospital scrubs  What Do You Feel Would Help You the Most Today? Social Support;Housing Assistance  If access to [facility] was not available, would you have sought care in the Emergency Department? No  Determination of Need Routine (7 days)  Options For Referral Medication Management;Outpatient Therapy;BH Urgent Care

## 2024-01-10 NOTE — Discharge Instructions (Addendum)
 Discharge recommendations:   Medications: Patient is to take medications as prescribed. The patient or patient's guardian is to contact a medical professional and/or outpatient provider to address any new side effects that develop. The patient or the patient's guardian should update outpatient providers of any new medications and/or medication changes.   Outpatient Follow up: Please review list of outpatient resources for psychiatry and counseling. Please follow up with your primary care provider for all medical related needs.   You are encouraged to follow up with [facility] for outpatient treatment.  Walk in/ Open Access Hours: Monday - Friday 8AM - 11AM   [facility] [address] [place], [place] [phone]  Therapy: We recommend that patient participate in individual therapy to address mental health concerns.  Atypical antipsychotics: If you are prescribed an atypical antipsychotic, it is recommended that your height, weight, BMI, blood pressure, fasting lipid panel, and fasting blood sugar be monitored by your outpatient providers.  Safety:   The following safety precautions should be taken:   No sharp objects. This includes scissors, razors, scrapers, and putty knives.   Chemicals should be removed and locked up.   Medications should be removed and locked up.   Weapons should be removed and locked up. This includes firearms, knives and instruments that can be used to cause injury.   The patient should abstain from use of illicit substances/drugs and abuse of any medications.  If symptoms worsen or do not continue to improve or if the patient becomes actively suicidal or homicidal then it is recommended that the patient return to the closest hospital emergency department, the [facility], or call 911 for further evaluation and treatment. National Suicide Prevention Lifeline 1-800-SUICIDE or  671-615-5798.  About 988 988 offers 24/7 access to trained crisis counselors who can help people experiencing mental health-related distress. People can call or text 988 or chat 988lifeline.org for themselves or if they are worried about a loved one who may need crisis support.

## 2024-01-10 NOTE — ED Provider Notes (Addendum)
 [place] EMERGENCY DEPARTMENT AT [facility] Provider Note   CSN: [id number] Arrival date & time: 01/10/24  1619     Patient presents with: Depression   Aaron Rubio is a 43 y.o. male.    Depression  Patient is a 43 year old male feeling the ED today with concerns for SI and visual hallucinations that have been ongoing for the last 2 days, noting that he has recently had trouble with custody with his kids, arguing with his partner and reports that he now has had desires to step in front of traffic.  Reports that he has been seeing shadows but denies any auditory hallucinations.  Endorses cocaine use and THC use 2 days ago but has not had any since.  This is a consultation with male for urgent care earlier today which exacerbated his feelings and made him feel more worthless.  This is when the SI became worse.  Denies alcohol use.  Denies HI.  Reports that he has not been taking his medications.  Here voluntarily, wishing to speak to someone about his SI.  Previous medical history of homelessness, psychosis, suicidal ideations, schizophrenia, HTN.  Denies headache, fever, vision changes, chest pain, shortness of breath, abdominal pain, nausea, vomiting, diarrhea, dysuria, lower leg swelling.       Prior to Admission medications   Medication Sig Start Date End Date Taking? Authorizing Provider  haloperidol  (HALDOL ) 10 MG tablet Take 1 tablet (10 mg total) by mouth daily. Patient not taking: Reported on 01/05/2024 01/03/24   Blair Robin H, NP  haloperidol  (HALDOL ) 20 MG tablet Take 1 tablet (20 mg total) by mouth at bedtime. Patient not taking: Reported on 01/05/2024 01/02/24   Blair Robin H, NP  hydrochlorothiazide  (HYDRODIURIL ) 12.5 MG tablet Take 1 tablet (12.5 mg total) by mouth daily. Patient not taking: Reported on 01/05/2024 01/03/24   Blair Robin H, NP  hydrOXYzine  (ATARAX ) 25 MG tablet Take 1 tablet (25 mg total) by mouth 3 (three) times daily  as needed for anxiety. Patient not taking: Reported on 01/05/2024 01/02/24   Blair Robin H, NP  mirtazapine  (REMERON ) 15 MG tablet Take 1 tablet (15 mg total) by mouth at bedtime. Patient not taking: Reported on 01/05/2024 01/02/24   Blair Robin H, NP  traZODone  (DESYREL ) 50 MG tablet Take 1 tablet (50 mg total) by mouth at bedtime as needed for sleep. Patient not taking: Reported on 01/05/2024 01/02/24   Blair Robin DEL, NP    Allergies: Patient has no known allergies.    Review of Systems  Psychiatric/Behavioral:  Positive for depression, hallucinations and suicidal ideas.   All other systems reviewed and are negative.   Updated Vital Signs BP 119/87 (BP Location: Right Arm)   Pulse 88   Temp 98.6 F (37 C) (Oral)   Resp 14   SpO2 100%   Physical Exam Vitals and nursing note reviewed.  Constitutional:      General: He is not in acute distress.    Appearance: Normal appearance. He is not ill-appearing.  HENT:     Head: Normocephalic and atraumatic.  Eyes:     General:        Right eye: No discharge.        Left eye: No discharge.     Extraocular Movements: Extraocular movements intact.     Conjunctiva/sclera: Conjunctivae normal.  Cardiovascular:     Rate and Rhythm: Normal rate and regular rhythm.     Pulses: Normal pulses.     Heart  sounds: Normal heart sounds. No murmur heard.    No friction rub. No gallop.  Pulmonary:     Effort: Pulmonary effort is normal. No respiratory distress.     Breath sounds: Normal breath sounds. No stridor. No wheezing, rhonchi or rales.  Chest:     Chest wall: No tenderness.  Abdominal:     General: Abdomen is flat.     Palpations: Abdomen is soft.     Tenderness: There is no abdominal tenderness. There is no right CVA tenderness, left CVA tenderness, guarding or rebound.  Musculoskeletal:     Cervical back: Normal range of motion and neck supple. No rigidity or tenderness.     Right lower leg: No edema.     Left lower leg: No  edema.  Skin:    General: Skin is warm and dry.     Findings: No bruising or erythema.  Neurological:     General: No focal deficit present.     Mental Status: He is alert and oriented to person, place, and time. Mental status is at baseline.     Sensory: No sensory deficit.     Motor: No weakness.     Gait: Gait normal.  Psychiatric:        Mood and Affect: Mood normal.     (all labs ordered are listed, but only abnormal results are displayed) Labs Reviewed  COMPREHENSIVE METABOLIC PANEL WITH GFR - Abnormal; Notable for the following components:      Result Value   Sodium 134 (*)    Glucose, Bld 103 (*)    Creatinine, Ser 1.27 (*)    AST 84 (*)    All other components within normal limits  RAPID URINE DRUG SCREEN, HOSP PERFORMED - Abnormal; Notable for the following components:   Cocaine POSITIVE (*)    Tetrahydrocannabinol POSITIVE (*)    All other components within normal limits  ETHANOL  CBC    EKG: None  Radiology: No results found.   Procedures   Medications Ordered in the ED - No data to display   Medical Decision Making Amount and/or Complexity of Data Reviewed Labs: ordered.   This patient is a 43 year old male who presents to the ED for concern of SI, wanting to step out in front of traffic, as well as Visual hallucinations, seeing shadows symptoms present for last 2 days.  Reports that they have Exacerbated by recent arguments with partner over children, feeling worthless.  Currently wishing to speak to someone about his feelings Possibly restarting medications. Last took cocaine and THC reportedly 2 days ago.  Denies alcohol use.  Had spoken earlier to behavioral urgent care this morning and was suspecting malingering however patient reporting that he was looking for resolution at that time.  Now it is presenting with plan as well as saying the conversation with urgent care exacerbating the feelings he had been experiencing earlier with the  struggles with his family.  On physical exam, patient is in no acute distress, afebrile, alert and orient x 4, speaking in full sentences, nontachypneic, nontachycardic.  LCTAB, RRR, no murmur, no abdominal tenderness to palpation, no lower leg swelling, oropharynx clear, no rashes or erythema noted.  Unremarkable exam otherwise.  Will get baseline labs in order to medically clear him and have him consult with TTS and be evaluated in the behavioral health unit.  Lab work shows mildly elevated creatinine as well as a mild elevated AST of 84, 90 be decreased from previous.  UDS also  positive for cocaine and THC.  Believe he is medically cleared at this time to be evaluated by TTS.  Believe that steals consult is warranted with changing symptoms at this time.  Will place in psych hold to undergo further evaluation.  Differential diagnoses prior to evaluation: The emergent differential diagnosis includes, but is not limited to, SI, HI, polypharmacy, drug abuse, malingering, depression, schizophrenia Exacerbation. This is not an exhaustive differential.   Past Medical History / Co-morbidities / Social History: homelessness, psychosis, suicidal ideations, schizophrenia, HTN.  Additional history: Chart reviewed. Pertinent results include:   Noted to have last been seen by behavioral urgent care, noted to not have any emergent condition and can be discharged with outpatient services including medication management individual therapy.  Lab Tests/Imaging studies: I personally interpreted labs/imaging and the pertinent results include:   CBC unremarkable BMP shows a mildly elevated creatinine as well as elevated AST of 44, similar to previous  UDS positive for cocaine and THC Ethanol negative    Medications:   I have reviewed the patients home medicines and have made adjustments as needed.  Critical Interventions: None  Social Determinants of Health: Notably suffering from homelessness as well  as interpersonal family issues which he says is causing him depressive symptoms and SI  Disposition: After consideration of the diagnostic results and the patients response to treatment, I feel that the patient would benefit from further evaluation by TTS, patient placed in psych hold pending their evaluation.  Final diagnoses:  Suicidal ideation  Hallucination    ED Discharge Orders     None          Beola Terrall RAMAN, PA-C 01/10/24 1751    Beola Terrall RAMAN, [place] 01/10/24 1804    Franklyn Sid SAILOR, MD 01/10/24 2011

## 2024-01-10 NOTE — ED Triage Notes (Signed)
 Pt reports I am going through depression, I feel like I am going to harm myself on the street.

## 2024-01-10 NOTE — ED Provider Notes (Signed)
 Behavioral Health Urgent Care Medical Screening Exam  Patient Name: Aaron Rubio MRN: [id number] Date of Evaluation: 01/10/24 Chief Complaint:  I need a solution. Diagnosis:  Final diagnoses:  Schizophrenia, unspecified type (HCC)  Homelessness  Malingering    History of Present illness: Aaron Rubio is a 43 y.o. male patient with a past psychiatric history significant for polysubstance abuse (cocaine and marijuana), MDD, and paranoid schizophrenia and a documented history of homelessness and malingering who presented to the [facility] Urgent Care voluntary unaccompanied with a complaint of I need a solution. Patient states, I am hurting myself out here in the streets. He states that he does not have proper clothes, shoes and is unable to sleep. He states that he's been homeless since 2023 and sleeps at the bus stop. He states that he does not have any family support. He states that he needs somewhere to sleep and wash his clothes. I discussed with the patient going to the [facility] to wash his clothes and for housing assistance. He states that the [facility] no longer allows people to wash clothes. He was provided with a list of local shelters in the area and was encouraged to use the facility phone to call and inquire about bed availability. He was also encouraged to pick up his medications from the [facility] and follow up here at the [facility] for medication management and counseling. He asked if he can go back to an inpatient hospital. I discussed with the patient that he does not meet criteria for inpatient psychiatric treatment and that the next level of care is for him to follow up with outpatient services. He states that he does not have a way to follow up. I offered to provide patient with a bus pass for f/u and to pick up medications. He was provided with two bus passes for transportation. He states, what if I am  suicidal. Patient was informed that he cannot use suicidal as a means to gain hospitalization. He does not voice a suicide plan or intent. His suicidality is conditioned on obtaining housing/shelter and he appears to have secondary gain due to homelessness.  On evaluation, patient does not participate fully in the evaluation and is fixated on finding a place to stay. He becomes irritable and defensive when provided with shelter resources. Supportive psychotherapy and suicide risk reduction techniques were attempted, but the patient refused to engage. He has a history of schizophrenia. However, he does not appear acutely psychotic on exam. There are no objective signs that the patient is hallucinating; he doesn't appear to be responding to internal or external stimuli or disorganized in thought process. Upon detailed history, the patient has various encounters with similar presentations and lack of compliance with psychiatric evaluation, treatment regimen and follow up care. Patient's suicidality is conditioned on obtaining housing/shelter and the patient appears to have secondary gain due to homelessness.   Per chart review, patient was seen at the [facility] emergency department from 7/9 to 7/10 for complaints of homelessness stating that it is cold and wet outside of that he is worried he is not safe.  Reported visual hallucinations without any auditory components. He was administered Haldol  10 mg. He refused urine sample.   Per chart review, patient was hospitalized at the [facility] from 12/23/2023 to 01/03/2024 for c/o SI and bizarre behavior. He was prescribed Haldol  10 mg daily and 20 mg at bedtime, mirtazapine  15 mg at bedtime,  hydrochlorothiazide  12.5 mg daily, hydroxyzine  25 mg 3 times daily as needed for anxiety, and trazodone  50 mg p.o. nightly as needed for sleep. He was recommended to follow up with Dr. Shirline for medication management, or [facility] for medication  management/therapy and [facility] for substance abuse.   Per chart review, he's had 17 ED visits and 4 inpatient admissions.   Flowsheet Row ED from 01/10/2024 in [facility] ED from 01/09/2024 in [facility] Emergency Department at [facility] ED from 01/05/2024 in [facility] Emergency Department at [facility]  C-SSRS RISK CATEGORY No Risk High Risk High Risk    Psychiatric Specialty Exam  Presentation  General Appearance:Disheveled  Eye Contact:Fair  Speech:Clear and Coherent  Speech Volume:Normal  Handedness:Right   Mood and Affect  Mood: Irritable  Affect: Congruent   Thought Process  Thought Processes: Coherent; Goal Directed  Descriptions of Associations:Intact  Orientation:Full (Time, Place and Person)  Thought Content:Logical  Diagnosis of Schizophrenia or Schizoaffective disorder in past: Yes  Duration of Psychotic Symptoms: Greater than six months  Hallucinations:No AVH reported on exam.  Ideas of Reference:None  Suicidal Thoughts:Yes, Passive Without Intent; Without Plan  Homicidal Thoughts:No  Sensorium  Memory: Immediate Fair; Recent Fair; Remote Fair  Judgment: Intact  Insight: Lacking; Present   Executive Functions  Concentration: Fair  Attention Span: Fair  Recall: [employer] of Knowledge: Fair  Language: Fair   Psychomotor Activity  Psychomotor Activity: Normal   Assets  Assets: Manufacturing systems engineer; Desire for Improvement   Sleep  Sleep: Poor    Physical Exam: Physical Exam Cardiovascular:     Rate and Rhythm: Normal rate.  Pulmonary:     Effort: Pulmonary effort is normal.  Musculoskeletal:        General: Normal range of motion.     Cervical back: Normal range of motion.  Neurological:     Mental Status: He is alert and oriented to person, place, and time.    Review of Systems  Constitutional: Negative.   HENT: Negative.    Respiratory:  Negative.    Cardiovascular: Negative.   Musculoskeletal: Negative.   Neurological: Negative.    Blood pressure (!) 130/92, pulse 86, temperature 98.1 F (36.7 C), temperature source Oral, resp. rate 17, SpO2 100%. There is no height or weight on file to calculate BMI.  Musculoskeletal: Strength & Muscle Tone: within normal limits Gait & Station: normal Patient leans: N/A   [facility] Discharge Disposition for Follow up and Recommendations: Case and plan of care discussed with Dr. Cole via telephone. Based on my evaluation the patient does not appear to have an emergency medical condition and can be discharged with resources and follow up care in outpatient services for Medication Management and Individual Therapy  Discharge recommendations:   Medications: Patient is to take medications as prescribed. The patient or patient's guardian is to contact a medical professional and/or outpatient provider to address any new side effects that develop. The patient or the patient's guardian should update outpatient providers of any new medications and/or medication changes.   Outpatient Follow up: Please review list of outpatient resources for psychiatry and counseling. Please follow up with your primary care provider for all medical related needs.   You are encouraged to follow up with [facility] for outpatient treatment.  Walk in/ Open Access Hours: Monday - Friday 8AM - 11AM   [facility] [address] [place], [place] [phone]  Therapy: We recommend  that patient participate in individual therapy to address mental health concerns.  Atypical antipsychotics: If you are prescribed an atypical antipsychotic, it is recommended that your height, weight, BMI, blood pressure, fasting lipid panel, and fasting blood sugar be monitored by your outpatient providers.  Safety:   The following safety precautions should be taken:   No sharp objects. This  includes scissors, razors, scrapers, and putty knives.   Chemicals should be removed and locked up.   Medications should be removed and locked up.   Weapons should be removed and locked up. This includes firearms, knives and instruments that can be used to cause injury.   The patient should abstain from use of illicit substances/drugs and abuse of any medications.  If symptoms worsen or do not continue to improve or if the patient becomes actively suicidal or homicidal then it is recommended that the patient return to the closest hospital emergency department, the [facility], or call 911 for further evaluation and treatment. National Suicide Prevention Lifeline 1-800-SUICIDE or 782-196-1433.  About 988 988 offers 24/7 access to trained crisis counselors who can help people experiencing mental health-related distress. People can call or text 988 or chat 988lifeline.org for themselves or if they are worried about a loved one who may need crisis support.   The [employer] [facility] The [employer]'s "U5235577" is a great source of information about community services available.  Access by dialing 2-1-1 from anywhere in [place] , or by website -  PooledIncome.pl.   Other Local Resources (Updated 10/2015)  Shelters  Services   Phone Number and Address  [facility] Housing for homeless and needy men with substance abuse issues [phone] [address] [place], [place]  [employer] [facility] [employer] Emergency assistance [employer] Pantry services [phone] [place], [place]  [facility] Domestic violence shelter for women and their children [phone] [place], [place]  [facility] Domestic violence shelter for women and their children [phone] [place], [place]  [facility] ([facility]) / Resources for the [employer] center for the homeless Information and referral to housing  resources Counseling Showers Laundry Barbershop Phone bank Mailroom Computer lab Medical clinic Bike maintenance center [phone] [address] [place], [place]  [facility] [employer] [facility]  Emergency housing Food Emergency financial assistance Permanent supportive housing [phone] [address] [place], [place]   [employer]  Crisis assistance Medication Housing Food Utility assistance [phone] [address] [place], [place]  [phone] [address] [place], [place]  [employer] of [place]    Transitional housing Case Chartered certified accountant assistance [phone] [address] [place], [place]  Lelon Hick, [employer] for adult men and women [phone] [address] [place], [place]  24-hour Crisis Line for those Facing Homelessness   Information and referral to community resources [phone]      Teresa Wyline [place], [place] 01/10/2024, 7:49 AM

## 2024-01-10 NOTE — BH Assessment (Signed)
 TTS Consult will be completed by IRIS. IRIS Coordinator will communicate in established secure chat assessment time and provider. Thanks

## 2024-01-11 ENCOUNTER — Encounter (HOSPITAL_COMMUNITY): Payer: Self-pay | Admitting: Psychiatry

## 2024-01-11 DIAGNOSIS — F1420 Cocaine dependence, uncomplicated: Secondary | ICD-10-CM | POA: Diagnosis present

## 2024-01-11 DIAGNOSIS — Z5900 Homelessness unspecified: Secondary | ICD-10-CM

## 2024-01-11 DIAGNOSIS — F1220 Cannabis dependence, uncomplicated: Secondary | ICD-10-CM | POA: Diagnosis present

## 2024-01-11 DIAGNOSIS — F1410 Cocaine abuse, uncomplicated: Secondary | ICD-10-CM | POA: Diagnosis present

## 2024-01-11 DIAGNOSIS — Z765 Malingerer [conscious simulation]: Secondary | ICD-10-CM

## 2024-01-11 NOTE — ED Provider Notes (Signed)
 Emergency Medicine Observation Re-evaluation Note  Aaron Rubio is a 43 y.o. male, seen on rounds today.  Pt initially presented to the ED for complaints of Depression Currently, the patient is resting comfortably. He had come to the ER after being cleared by [facility].  Patient has substance use disorder, and has having some SI.  Physical Exam  BP 119/87 (BP Location: Right Arm)   Pulse 88   Temp 98.6 F (37 C) (Oral)   Resp 14   SpO2 100%  Physical Exam General: No acute distress Cardiac: Regular rate Lungs: No respiratory distress Psych: Currently calm  ED Course / MDM  EKG:   I have reviewed the labs performed to date as well as medications administered while in observation.  Recent changes in the last 24 hours include -none.  Plan  Current plan is for psychiatry to assess the patient.  I received preliminary guidance that patient likely is malingering and will be discharged.    Charlyn Sora, MD 01/11/24 [phone]

## 2024-01-11 NOTE — Consult Note (Addendum)
 [facility] Psychiatric Consult Initial  Patient Name: .Aaron Rubio  MRN: [id number]  DOB: 1981/02/07  Consult Order details:  Orders (From admission, onward)     Start     Ordered   01/10/24 1750  CONSULT TO CALL ACT TEAM       Ordering Provider: Beola Terrall RAMAN, PA-C  Provider:  (Not yet assigned)  Question:  Reason for Consult?  Answer:  Psych consult   01/10/24 1749             Mode of Visit: In person    Psychiatry Consult Evaluation  Service Date: January 11, 2024 LOS:  LOS: 0 days  Chief Complaint: It's just become all too much to handle, I'm tired of fighting with the wife  Primary Psychiatric Diagnoses  Malingering 2.  Homelessness 3.  Cocaine use disorder, severe, dependence 4.  Cannabis use disorder, severe, dependence  Assessment   [name] is a 43 y.o. AA male with a past psychiatric history of polysubstance abuse (cocaine and cannabis), MDD, paranoid schizophrenia, and schizoaffective disorder, and pertinent medical comorbidities/history that include chronic homelessness with multiple accounts of malingering for secondary gain in the context of a need for housing, who presents this encounter by way of self for endorsements of worsening depression, suicidal ideations, and hallucinations, thus psychiatry was consulted for specialty evaluation and recommendations. Patient is medically clear at this time, as well as voluntary, per EDP team.  Of note, patient was appreciably seen and evaluated by this providers colleague at the behavioral health urgent care across the street and determined to be malingering for secondary gain, in the context of chronic homelessness, only a few short hours after arriving to the [facility] this encounter, as well as is appreciably well-known to the behavioral health service line at [facility] health, appreciably has had 17 ED visits and 4 inpatient mental health hospitalizations in the last several months alone (see Patrice, NP  report from [facility] 01/10/2024).  Upon evaluation, patient presents with symptomology that is most consistent with malingering, in the context of secondary gain for housing, due to chronic homelessness, as well as a cocaine and cannabis use disorder, severe, with dependence.  Evidence of this is appreciable from evaluation conducted, where patient endorses to this provider that he has a wife, 3 kids, and house that he lives at down the street, and that his primary and only psychosocial stressor is that he got into a fight yesterday morning with his wife about financial issues, which he states has led to the abrupt development of depression, suicidal thoughts, experiencing hallucinations, and increased cocaine and cannabis use, outside of his already 3 year history of daily illicit substance use, per his endorsements, of which is all largely verifiably untrue (minus the substance abuse), given the patient was seen by this provider's colleague earlier yesterday, and during this time, endorsed he has no friends or family support, is chronically homeless and has been homeless for the last 3 years, gives no mention of having children and/or psychosocial stress with a wife, but rather stress around homelessness and not having anywhere to go or to do laundry, and gives no endorsements of hallucinations, or objective evidence of this, and only during the end of evaluation performed, where he is given recommendations for follow-up with outpatient resources, which the patient chronically has never utilized outpatient resources, does he begin to endorse suicidal ideations, as a means to gain hospitalization.  Given evaluation conducted, during which the patient presents incongruent in his  presentation to endorsed mood and symptomology he states he is experiencing, gives verifiably untrue history of present illness, as evidence from report and examination performed earlier yesterday by this provider's colleague, as well as  chart review that was conducted, there is no evidence that the patient is an imminent risk to himself or others, presenting decompensated into psychosis or experiencing psychotic features, lacking of capacity, experiencing intoxication of concern for safety, or to benefit/be appropriate for, another repeat inpatient mental health hospitalization, thus the recommendation at this time is for psychiatric clearance, as well as additional recommendations listed below.  Patient is unwilling to participate in any interventions, outside of being admitted for inpatient mental health hospitalization, given the need for housing. Patient's treatment goals of being admitted for temporary housing are inappropriate. Any safety benefit of hospitalization is mitigated by patient's lack of collaboration and engagement with the treatment team. Continued treatment in the hospital is delaying patient's engagement with outpatient services. Continued hospitalization is no longer benefiting the patient and may be unnecessarily delaying effective intervention.   Patient has not benefited from past hospitalizations under similar circumstances in terms of suicide risk modification or improvement in mental health or social conditions. Patient's repeated use of emergency department and inpatient services, instead of recommended outpatient follow-up, is ineffective in helping improvement. Inpatient hospitalization is not indicated, and the patient is refusing interventions that the clinical care team has offered in the emergency department to mitigate risk of self-harm, other than allowing admission.  Patient needs can be met at a lesser level of care. Prognosis is guarded, given his chronic non compliance with recommended care, and his inappropriate use of medical resources   Spoke with Dr. Larina who is in agreement with recommendation for psychiatric clearance, as well as additional recommendations listed below.  Diagnoses:  Active  Hospital problems: Principal Problem:   Malingering Active Problems:   Homelessness   Cocaine use disorder, severe, dependence (HCC)   Cannabis use disorder, severe, dependence (HCC)    Plan   # Malingering # Homelessness # Cocaine use disorder, severe, dependence # Cannabis use disorder, severe, dependence  ## Psychiatric Recommendations:   - Recommend TOC consult for shelter resources, substance abuse resources, and bus pass - Recommend safety return precautions - Recommend patient consider close outpatient follow-up with substance abuse resources given upon discharge - Recommend patient consider close outpatient follow-up with the [facility] behavioral health outpatient center for medication management/therapy  ## Medical Decision Making Capacity: Not specifically addressed in this encounter  ## Further Work-up: None at this time  ## Disposition:-- There are no psychiatric contraindications to discharge at this time  ## Behavioral / Environmental: -Routine agitation/safety precautions until discharge; safety return precautions upon discharge    ## Safety and Observation Level:  - Based on my clinical evaluation, I estimate the patient to be at low risk of self harm in the current setting and upon recommendation for discharge. - At this time, we recommend  routine. This decision is based on my review of the chart including patient's history and current presentation, interview of the patient, mental status examination, and consideration of suicide risk including evaluating suicidal ideation, plan, intent, suicidal or self-harm behaviors, risk factors, and protective factors. This judgment is based on our ability to directly address suicide risk, implement suicide prevention strategies, and develop a safety plan while the patient is in the clinical setting. Please contact our team if there is a concern that risk level has changed.  CSSR Risk Category:C-SSRS  RISK CATEGORY:  Error: Q3, 4, or 5 should not be populated when Q2 is No  Suicide Risk Assessment: Patient has following modifiable risk factors for suicide: social isolation, medication noncompliance, and recent psychiatric hospitalization, which we are addressing by recommendations. Patient has following non-modifiable or demographic risk factors for suicide: male gender, history of suicide attempt, history of self harm behavior, and psychiatric hospitalization Patient has the following protective factors against suicide: Access to outpatient mental health care and Frustration tolerance  Thank you for this consult request. Recommendations have been communicated to the primary team.  We will sign off at this time.   Jerel JINNY Gravely, NP       History of Present Illness   Naythan Brylin Stanislawski is a 43 y.o. AA male with a past psychiatric history of polysubstance abuse (cocaine and cannabis), MDD, paranoid schizophrenia, and schizoaffective disorder, and pertinent medical comorbidities/history that include chronic homelessness with multiple accounts of malingering for secondary gain in the context of a need for housing, who presents this encounter by way of self for endorsements of worsening depression, suicidal ideations, and hallucinations, thus psychiatry was consulted for specialty evaluation and recommendations. Patient is medically clear at this time, as well as voluntary, per EDP team.  Patient seen today at the [facility] emergency department for face-to-face psychiatric evaluation.  Upon evaluation, patient endorses that he came in this encounter because yesterday morning he got into another fight with his wife in front of his 3 kids, and because of how intense the verbal back-and-forth was, with appreciable physical posturing, states that, It's just become all too much to handle, I'm tired of fighting with the wife, and endorses that this has led to the development of an abrupt onset of suicidal  ideations, depression, and visual hallucinations; patient denies any other psychosocial stressors.   Prompted to expand on fight he had yesterday morning with his wife, states that they were essentially fighting over financial issues and intimacy stuff, and then forwards that, and I am just tired of everything and further states, it is the same fighting every time.   Patient endorses that he lives in a house, down the street, and then when prompted to expand on possible financial issues, and endorsed intimacy issues, patient endorses that he recently lost his job and has been having difficulty finding another, but then shares and states that he is in the process of filing disability, though gives no endorsement as to what for, despite prompting, just states, I think my mental health is just getting to the point works not gonna be possible anymore, in regards to intimacy issues, states that he does not want to talk about these issues.  Prompted to expand on endorsements of depression, patient endorses that his mood currently is depressed, and presents with an incongruent affect and a euthymic interpersonal style, with good eye contact, and no signs of withdrawal or psychomotor slowing.  Prompted to expand on depressive symptomology, patient endorses that he has developed since yesterday troubles with sleep, troubles with eating, fatigue, seeing shadows, and suicidal ideations. Patient reports to cope with how he has abruptly began feeling and experiencing things in the aforementioned ways, states that he has increased recreational use of cocaine and cannabis.  Patient denies any anxiety and/or panic symptoms.  Prompted to expand on suicidal ideations, patient endorses that he has suicidal ideations with plan and intent, but when asked about plan and intent, patient states vaguely, I have a whole list of things, and  when prompted again to articulate plan and intent, patient states he is  unable.  Prompted to expand on endorsements of seeing shadows, patient endorses that he has begun seeing shadowy figures that are distressing to him, and states that during evaluation he is presently experiencing seeing shadows, though objectively, the patient does not present with any evidence of responding to internal and/or external stimuli, and/or presenting with psychotic features, and patient orientation is intact, without concerns for fluctuations of consciousness.  Patient additionally denies auditory hallucinations.  Prompted to expand on endorse recreational cocaine and cannabis use, patient endorses that he has been using cannabis and cocaine for the last 3 years, ever since I got out of prison for bank robbery, and states that recently, in the context of stress with his wife, states that he has been utilizing larger amounts of the aforementioned illicit substances, describes using a couple grams per day of both.  Prompted to expand on other illicit substance use, patient denies, but endorses that he utilizes tobacco daily, in the form of about a pack a day, and denies any significant EtOH use history, though does state that he drinks occasionally; UDS appreciably positive for cannabinoids and cocaine, BAL unremarkable.  Patient endorses no history of outpatient psychiatric medication management and/or therapy service utilization.  Patient endorses a history of x 1 suicide attempt many years ago, but denies any history of self-injurious behavior.  Patient endorses no homicidal ideations.  Patient endorses that he is not on any medications, and cannot recall medications he has taken in the past historically.  Patient endorses, and notably minimizes, history of Emergency Department and inpatient mental health hospitalizations, states vaguely that, I have sought help in the past, but I cannot remember any specific times.  Patient endorses no history of rehabilitation services for illicit  substance use.  Redirected to discuss this provider's evaluation, as well as extensive chart review conducted, and recent evaluation conducted by this provider's colleague, but upon laying out verbally this provider's evaluation, and the objective and subjective facts as to why recommendations would not be changing from recommendations he was already given by this provider's colleague earlier yesterday, patient became upset and stated that he did not agree, and shutdown any meaningful conversation.  Review of Systems  Constitutional:  Positive for malaise/fatigue and weight loss.  Psychiatric/Behavioral:  Positive for depression, hallucinations (Reports seeing shadows), substance abuse (Cocaine and cannabis daily) and suicidal ideas. The patient has insomnia. The patient is not nervous/anxious.   All other systems reviewed and are negative.    Psychiatric and Social History  Psychiatric History:  Information collected from chart review/patient  Prev Dx/Sx: Polysubstance abuse (cocaine and marijuana), MDD, paranoid schizophrenia, and schizoaffective disorder Current Psych Provider: None Home Meds (current): None Previous Med Trials: Multiple, most recent Haldol , mirtazapine , trazodone , hydroxyzine  Therapy: None  Prior Psych Hospitalization: Multiple, most recent per records, [facility] 12/24/2023 Prior Self Harm: Yes, reports x 1 suicide attempt 2008 Prior Violence: Yes, multiple accounts of becoming angry with staff and providers when recommended for discharge  Family Psych History: None reported Family Hx suicide: None reported  Social History:  Developmental Hx: None reported Educational Hx: High school Occupational Hx: Reports he just lost his job, applying for mental health disability though Legal Hx: Reports incarceration for 16 years for bank robbery, states he got out of prison 3 years ago Living Situation: Reports living in a house with wife and 3 kids, but per evaluation yesterday  with his providers colleague, appreciably homeless  chronically for 3 years Spiritual Hx: None reported  Access to weapons/lethal means: None endorsed or reported  Substance History Alcohol: Reports occasional use, none recently Tobacco: Daily Illicit drugs: Cocaine and cannabis daily, 3 years Prescription drug abuse: None reported Rehab hx: Reports none  Exam Findings  Physical Exam: As above Vital Signs:  Temp:  [98.6 F (37 C)] 98.6 F (37 C) (07/10 1716) Pulse Rate:  [88] 88 (07/10 1716) Resp:  [14] 14 (07/10 1716) BP: (119)/(87) 119/87 (07/10 1716) SpO2:  [100 %] 100 % (07/10 1716) Blood pressure 119/87, pulse 88, temperature 98.6 F (37 C), temperature source Oral, resp. rate 14, SpO2 100%. There is no height or weight on file to calculate BMI.  Physical Exam Vitals and nursing note reviewed.  Constitutional:      General: He is not in acute distress.    Appearance: Normal appearance. He is normal weight. He is not ill-appearing, toxic-appearing or diaphoretic.     Comments: Incongruent interpersonal style   Pulmonary:     Effort: Pulmonary effort is normal.  Skin:    General: Skin is warm and dry.  Neurological:     Mental Status: He is alert and oriented to person, place, and time.     Motor: No weakness, tremor or seizure activity.  Psychiatric:        Attention and Perception: Attention normal. He does not perceive auditory hallucinations.        Mood and Affect: Affect normal. Mood is depressed.        Speech: Speech normal.        Behavior: Behavior is cooperative.        Thought Content: Thought content is not paranoid or delusional. Thought content includes suicidal ideation. Thought content does not include homicidal ideation. Thought content includes suicidal plan.        Cognition and Memory: Cognition and memory normal.        Judgment: Judgment normal.     Comments: Perception: Reports seeing shadows, objectively, no evidence of this; denies auditory  hallucinations     Mental Status Exam: General Appearance: Normal weight unkept African-American male with euthymic interpersonal style  Orientation:  Full (Time, Place, and Person)  Memory:  WDL  Concentration:  Concentration: Fair and Attention Span: Fair  Recall:  Fair  Attention  Fair  Eye Contact:  Good  Speech:  Clear and Coherent and Normal Rate  Language:  Good  Volume:  Normal  Mood: Depressed  Affect:  Non-Congruent  Thought Process:  Coherent, Goal Directed, and Linear  Thought Content:  Negative and Logical  Suicidal Thoughts:  Yes.  with intent/plan  Homicidal Thoughts:  No  Judgement:  Intact  Insight:  Lacking  Psychomotor Activity:  Normal  Akathisia:  No  Fund of Knowledge:  Fair      Assets:  Manufacturing systems engineer Leisure Time Physical Health Resilience  Cognition:  WNL  ADL's:  Intact  AIMS (if indicated):   0     Other History   These have been pulled in through the EMR, reviewed, and updated if appropriate.  Family History:  The patient's family history is not on file.  Medical History: Past Medical History:  Diagnosis Date   Hypertension    Schizophrenia [facility])     Surgical History: History reviewed. No pertinent surgical history.   Medications:  No current facility-administered medications for this encounter.  Current Outpatient Medications:    haloperidol  (HALDOL ) 10 MG tablet, Take 1 tablet (10 mg  total) by mouth daily. (Patient not taking: Reported on 01/05/2024), Disp: 30 tablet, Rfl: 0   haloperidol  (HALDOL ) 20 MG tablet, Take 1 tablet (20 mg total) by mouth at bedtime. (Patient not taking: Reported on 01/05/2024), Disp: 30 tablet, Rfl: 0   hydrochlorothiazide  (HYDRODIURIL ) 12.5 MG tablet, Take 1 tablet (12.5 mg total) by mouth daily. (Patient not taking: Reported on 01/05/2024), Disp: 30 tablet, Rfl: 0   hydrOXYzine  (ATARAX ) 25 MG tablet, Take 1 tablet (25 mg total) by mouth 3 (three) times daily as needed for anxiety. (Patient not  taking: Reported on 01/05/2024), Disp: 30 tablet, Rfl: 0   mirtazapine  (REMERON ) 15 MG tablet, Take 1 tablet (15 mg total) by mouth at bedtime. (Patient not taking: Reported on 01/05/2024), Disp: 30 tablet, Rfl: 0   traZODone  (DESYREL ) 50 MG tablet, Take 1 tablet (50 mg total) by mouth at bedtime as needed for sleep. (Patient not taking: Reported on 01/05/2024), Disp: 30 tablet, Rfl: 0  Allergies: No Known Allergies  Jerel JINNY Gravely, NP

## 2024-01-11 NOTE — BH Assessment (Signed)
 TTS attempted to assess pt. Per Marko, RN, pt is asleep. IRIS consult time still pending.

## 2024-01-11 NOTE — Discharge Instructions (Signed)
 Please consider following up with outpatient services for substance abuse Please consider obtaining from illicit substance use Please consider close outpatient follow-up with psychiatric medication management/therapy services Please strongly here to safety return precautions discussed today

## 2024-01-14 ENCOUNTER — Emergency Department (EMERGENCY_DEPARTMENT_HOSPITAL)
Admission: EM | Admit: 2024-01-14 | Discharge: 2024-01-17 | Disposition: A | Discharge status: Home / Self Care | Source: Home / Self Care

## 2024-01-14 ENCOUNTER — Other Ambulatory Visit: Payer: Self-pay

## 2024-01-14 ENCOUNTER — Emergency Department (HOSPITAL_COMMUNITY)
Admission: EM | Admit: 2024-01-14 | Discharge: 2024-01-14 | Disposition: A | Discharge status: Home / Self Care | Attending: Emergency Medicine | Admitting: Emergency Medicine

## 2024-01-14 ENCOUNTER — Encounter (HOSPITAL_COMMUNITY): Payer: Self-pay | Admitting: *Deleted

## 2024-01-14 DIAGNOSIS — F1220 Cannabis dependence, uncomplicated: Secondary | ICD-10-CM | POA: Diagnosis present

## 2024-01-14 DIAGNOSIS — R45851 Suicidal ideations: Secondary | ICD-10-CM | POA: Insufficient documentation

## 2024-01-14 DIAGNOSIS — M79671 Pain in right foot: Secondary | ICD-10-CM | POA: Diagnosis present

## 2024-01-14 DIAGNOSIS — Z5900 Homelessness unspecified: Secondary | ICD-10-CM | POA: Insufficient documentation

## 2024-01-14 DIAGNOSIS — Z765 Malingerer [conscious simulation]: Secondary | ICD-10-CM | POA: Diagnosis not present

## 2024-01-14 DIAGNOSIS — F1994 Other psychoactive substance use, unspecified with psychoactive substance-induced mood disorder: Secondary | ICD-10-CM | POA: Diagnosis present

## 2024-01-14 DIAGNOSIS — F1420 Cocaine dependence, uncomplicated: Secondary | ICD-10-CM | POA: Insufficient documentation

## 2024-01-14 DIAGNOSIS — M79672 Pain in left foot: Secondary | ICD-10-CM | POA: Diagnosis not present

## 2024-01-14 DIAGNOSIS — I10 Essential (primary) hypertension: Secondary | ICD-10-CM | POA: Insufficient documentation

## 2024-01-14 DIAGNOSIS — F1210 Cannabis abuse, uncomplicated: Secondary | ICD-10-CM | POA: Diagnosis not present

## 2024-01-14 DIAGNOSIS — R070 Pain in throat: Secondary | ICD-10-CM | POA: Diagnosis present

## 2024-01-14 DIAGNOSIS — F1290 Cannabis use, unspecified, uncomplicated: Secondary | ICD-10-CM | POA: Diagnosis not present

## 2024-01-14 DIAGNOSIS — F329 Major depressive disorder, single episode, unspecified: Secondary | ICD-10-CM | POA: Insufficient documentation

## 2024-01-14 LAB — ACETAMINOPHEN LEVEL: Acetaminophen (Tylenol), Serum: 12 ug/mL (ref 10–30)

## 2024-01-14 LAB — CBC WITH DIFFERENTIAL/PLATELET
Abs Immature Granulocytes: 0.03 K/uL (ref 0.00–0.07)
Basophils Absolute: 0.1 K/uL (ref 0.0–0.1)
Basophils Relative: 1 %
Eosinophils Absolute: 1 K/uL — ABNORMAL HIGH (ref 0.0–0.5)
Eosinophils Relative: 10 %
HCT: 43.4 % (ref 39.0–52.0)
Hemoglobin: 13.7 g/dL (ref 13.0–17.0)
Immature Granulocytes: 0 %
Lymphocytes Relative: 24 %
Lymphs Abs: 2.2 K/uL (ref 0.7–4.0)
MCH: 26.4 pg (ref 26.0–34.0)
MCHC: 31.6 g/dL (ref 30.0–36.0)
MCV: 83.8 fL (ref 80.0–100.0)
Monocytes Absolute: 0.8 K/uL (ref 0.1–1.0)
Monocytes Relative: 9 %
Neutro Abs: 5.1 K/uL (ref 1.7–7.7)
Neutrophils Relative %: 56 %
Platelets: 274 K/uL (ref 150–400)
RBC: 5.18 MIL/uL (ref 4.22–5.81)
RDW: 13.5 % (ref 11.5–15.5)
WBC: 9.2 K/uL (ref 4.0–10.5)
nRBC: 0 % (ref 0.0–0.2)

## 2024-01-14 LAB — COMPREHENSIVE METABOLIC PANEL WITH GFR
ALT: 36 U/L (ref 0–44)
AST: 52 U/L — ABNORMAL HIGH (ref 15–41)
Albumin: 3.9 g/dL (ref 3.5–5.0)
Alkaline Phosphatase: 66 U/L (ref 38–126)
Anion gap: 10 (ref 5–15)
BUN: 13 mg/dL (ref 6–20)
CO2: 25 mmol/L (ref 22–32)
Calcium: 9.2 mg/dL (ref 8.9–10.3)
Chloride: 99 mmol/L (ref 98–111)
Creatinine, Ser: 1.39 mg/dL — ABNORMAL HIGH (ref 0.61–1.24)
GFR, Estimated: >60 mL/min (ref >60)
Glucose, Bld: 169 mg/dL — ABNORMAL HIGH (ref 70–99)
Potassium: 3.1 mmol/L — ABNORMAL LOW (ref 3.5–5.1)
Sodium: 134 mmol/L — ABNORMAL LOW (ref 135–145)
Total Bilirubin: 0.8 mg/dL (ref 0.0–1.2)
Total Protein: 7 g/dL (ref 6.5–8.1)

## 2024-01-14 LAB — SALICYLATE LEVEL: Salicylate Lvl: <7 mg/dL — ABNORMAL LOW (ref 7.0–30.0)

## 2024-01-14 LAB — ETHANOL: Alcohol, Ethyl (B): <15 mg/dL (ref <15)

## 2024-01-14 LAB — RAPID URINE DRUG SCREEN, HOSP PERFORMED
Amphetamines: NOT DETECTED
Barbiturates: NOT DETECTED
Benzodiazepines: NOT DETECTED
Cocaine: POSITIVE — AB
Opiates: NOT DETECTED
Tetrahydrocannabinol: NOT DETECTED

## 2024-01-14 MED ORDER — POTASSIUM CHLORIDE CRYS ER 20 MEQ PO TBCR
40.0000 meq | EXTENDED_RELEASE_TABLET | Freq: Once | ORAL | Status: AC
  Administered 2024-01-14: 40 meq via ORAL
  Filled 2024-01-14: qty 4

## 2024-01-14 NOTE — ED Triage Notes (Signed)
 Patient stated that he was having pain in his back and been feeling depressed and now having thoughts of killing himself. Patient stated he is homeless, working his job is stressful, relationship issues is causing him to want to kill himself. Patient stated that he would use a gun to put In his mouth to shoot himself. No other complaints at this time.

## 2024-01-14 NOTE — ED Provider Notes (Signed)
 [place] EMERGENCY DEPARTMENT AT [facility] Provider Note   CSN: [id number] Arrival date & time: 01/14/24  1755     Patient presents with: Psychiatric Evaluation   Aaron Rubio is a 43 y.o. male.   HPI   Patient presents because he feels like hurting himself.  Patient states he is depressed.  States he had thoughts about hurting himself today including blowing his brains out.  Denies any overdose attempt.  No previous overdose attempt.  Denies all other complaints.  No chest pain or shortness of breath.  No nausea vomit diarrhea.  Patient states he just feels hopeless at baseline.  No thoughts of hurting others.  No auditory or visualizations.  Previous medical history reviewed : Patient was last seen in ED earlier today.  Complained about throat pain.  Negative workup at that time.   Prior to Admission medications   Medication Sig Start Date End Date Taking? Authorizing Provider  haloperidol  (HALDOL ) 10 MG tablet Take 1 tablet (10 mg total) by mouth daily. Patient not taking: Reported on 01/05/2024 01/03/24   Blair Robin H, NP  haloperidol  (HALDOL ) 20 MG tablet Take 1 tablet (20 mg total) by mouth at bedtime. Patient not taking: Reported on 01/05/2024 01/02/24   Blair Robin H, NP  hydrochlorothiazide  (HYDRODIURIL ) 12.5 MG tablet Take 1 tablet (12.5 mg total) by mouth daily. Patient not taking: Reported on 01/05/2024 01/03/24   Blair Robin H, NP  hydrOXYzine  (ATARAX ) 25 MG tablet Take 1 tablet (25 mg total) by mouth 3 (three) times daily as needed for anxiety. Patient not taking: Reported on 01/05/2024 01/02/24   Blair Robin H, NP  mirtazapine  (REMERON ) 15 MG tablet Take 1 tablet (15 mg total) by mouth at bedtime. Patient not taking: Reported on 01/05/2024 01/02/24   Blair Robin H, NP  traZODone  (DESYREL ) 50 MG tablet Take 1 tablet (50 mg total) by mouth at bedtime as needed for sleep. Patient not taking: Reported on 01/05/2024 01/02/24   Blair Robin DEL, NP    Allergies: Patient has no known allergies.    Review of Systems  Updated Vital Signs BP (!) 159/99   Pulse 88   Temp 98.7 F (37.1 C) (Oral)   Resp 18   Wt 84.4 kg   SpO2 100%   BMI 23.88 kg/m   Physical Exam  (all labs ordered are listed, but only abnormal results are displayed) Labs Reviewed  COMPREHENSIVE METABOLIC PANEL WITH GFR - Abnormal; Notable for the following components:      Result Value   Sodium 134 (*)    Potassium 3.1 (*)    Glucose, Bld 169 (*)    Creatinine, Ser 1.39 (*)    AST 52 (*)    All other components within normal limits  RAPID URINE DRUG SCREEN, HOSP PERFORMED - Abnormal; Notable for the following components:   Cocaine POSITIVE (*)    All other components within normal limits  CBC WITH DIFFERENTIAL/PLATELET - Abnormal; Notable for the following components:   Eosinophils Absolute 1.0 (*)    All other components within normal limits  SALICYLATE LEVEL - Abnormal; Notable for the following components:   Salicylate Lvl <7.0 (*)    All other components within normal limits  ETHANOL  ACETAMINOPHEN  LEVEL    EKG: EKG Interpretation Date/Time:  Monday January 14 2024 21:46:48 EDT Ventricular Rate:  82 PR Interval:  160 QRS Duration:  95 QT Interval:  405 QTC Calculation: 473 R Axis:   -4  Text  Interpretation: Sinus rhythm No significant change since last tracing Confirmed by Patt Alm DEL [phone]) on 01/14/2024 9:53:51 PM  Radiology: No results found.   Procedures   Medications Ordered in the ED  potassium chloride  SA (KLOR-CON  M) CR tablet 40 mEq (40 mEq Oral Given 01/14/24 2156)                                    Medical Decision Making Amount and/or Complexity of Data Reviewed Labs: ordered.  Risk Prescription drug management.    Previous medical history reviewed : Patient was last seen in ED earlier today.  Complained about throat pain.  Negative workup at that time.   Patient presents because he feels like hurting  himself.  Patient states he is depressed.  States he had thoughts about hurting himself today including blowing his brains out.  Denies any overdose attempt.  No previous overdose attempt.  Denies all other complaints.  No chest pain or shortness of breath.  No nausea vomit diarrhea.  Patient states he just feels hopeless at baseline.  No thoughts of hurting others.  No auditory or visualizations.  Previous medical history reviewed : Patient was last seen in ED earlier today.  Complained about throat pain.  Negative workup at that time.   Upon exam, patient ANO x 3 GCS 15.   No Signs of trauma to the patient.  Hemodynamically stable.  Potassium at 3.1.  Replaced orally.  Negative salicylate and acetaminophen .   Medically Clear.  Pending psychiatric evaluation.          Final diagnoses:  Suicidal ideation    ED Discharge Orders     None          [name], MD 01/14/24 2314

## 2024-01-14 NOTE — ED Notes (Signed)
Save red tube in main lab 

## 2024-01-14 NOTE — ED Provider Notes (Signed)
 [place] EMERGENCY DEPARTMENT AT [facility] Provider Note   CSN: [id number] Arrival date & time: 01/14/24  9183     Patient presents with: Foot Pain   Aaron Rubio is a 43 y.o. male.   43 year old male with prior medical history as detailed below presents for evaluation.  Patient reports that his feet are sore and paint up.  He is homeless.  Appears that he has been walking a lot.  He reports that he sleeps on the street.  Patient was picked up from a local coffee shop and brought to the ED for evaluation.  He has a bag with a muffin and it.  He reports this is his breakfast.  He denies other complaint.  He appears to be very tired and is most interested in taking a nap during the evaluation.  The history is provided by the patient and medical records.       Prior to Admission medications   Medication Sig Start Date End Date Taking? Authorizing Provider  haloperidol  (HALDOL ) 10 MG tablet Take 1 tablet (10 mg total) by mouth daily. Patient not taking: Reported on 01/05/2024 01/03/24   Blair Robin H, NP  haloperidol  (HALDOL ) 20 MG tablet Take 1 tablet (20 mg total) by mouth at bedtime. Patient not taking: Reported on 01/05/2024 01/02/24   Blair Robin H, NP  hydrochlorothiazide  (HYDRODIURIL ) 12.5 MG tablet Take 1 tablet (12.5 mg total) by mouth daily. Patient not taking: Reported on 01/05/2024 01/03/24   Blair Robin H, NP  hydrOXYzine  (ATARAX ) 25 MG tablet Take 1 tablet (25 mg total) by mouth 3 (three) times daily as needed for anxiety. Patient not taking: Reported on 01/05/2024 01/02/24   Blair Robin H, NP  mirtazapine  (REMERON ) 15 MG tablet Take 1 tablet (15 mg total) by mouth at bedtime. Patient not taking: Reported on 01/05/2024 01/02/24   Blair Robin H, NP  traZODone  (DESYREL ) 50 MG tablet Take 1 tablet (50 mg total) by mouth at bedtime as needed for sleep. Patient not taking: Reported on 01/05/2024 01/02/24   Blair Robin DEL, NP    Allergies:  Patient has no known allergies.    Review of Systems  All other systems reviewed and are negative.   Updated Vital Signs BP (!) 160/95 (BP Location: Left Arm)   Pulse 80   Temp 98.3 F (36.8 C) (Oral)   Resp 16   Wt 98.4 kg   SpO2 97%   BMI 27.86 kg/m   Physical Exam Vitals and nursing note reviewed.  Constitutional:      General: He is not in acute distress.    Appearance: Normal appearance. He is well-developed.  HENT:     Head: Normocephalic and atraumatic.  Eyes:     Conjunctiva/sclera: Conjunctivae normal.     Pupils: Pupils are equal, round, and reactive to light.  Cardiovascular:     Rate and Rhythm: Normal rate and regular rhythm.     Heart sounds: Normal heart sounds.  Pulmonary:     Effort: Pulmonary effort is normal. No respiratory distress.     Breath sounds: Normal breath sounds.  Abdominal:     General: There is no distension.     Palpations: Abdomen is soft.     Tenderness: There is no abdominal tenderness.  Musculoskeletal:        General: No deformity. Normal range of motion.     Cervical back: Normal range of motion and neck supple.  Skin:    General: Skin is  warm and dry.     Comments: Multiple abrasions / superficial blisters to both feet - no cellulitis noted   Neurological:     General: No focal deficit present.     Mental Status: He is alert and oriented to person, place, and time.     (all labs ordered are listed, but only abnormal results are displayed) Labs Reviewed - No data to display  EKG: None  Radiology: No results found.   Procedures   Medications Ordered in the ED - No data to display                                  Medical Decision Making   Medical Screen Complete  This patient presented to the ED with complaint of bilateral foot pain.  This complaint involves an extensive number of treatment options. The initial differential diagnosis includes, but is not limited to, bilateral foot pain secondary to poor  fitting sneakers and excessive walking.  This presentation is: Chronic and Self-Limited  Patient is homeless.  He is here frequently for multiple complaints.  Today he complains of sore feet secondary to walking a lot with poorly fitted shoes.  Exam does not suggest acute abnormality requiring further workup or hospitalization.  Patient is appropriate for discharge.  Importance close follow-up stressed.  Strict return precautions given and understood.  Additional history obtained: External records from outside sources obtained and reviewed including prior ED visits and prior Inpatient records.    Problem List / ED Course:  Bilateral foot pain   Reevaluation:  After the interventions noted above, I reevaluated the patient and found that they have: improved   Disposition:  After consideration of the diagnostic results and the patients response to treatment, I feel that the patent would benefit from close outpatient follow-up.       Final diagnoses:  Foot pain, bilateral    ED Discharge Orders     None          Laurice Maude BROCKS, MD 01/14/24 1213

## 2024-01-14 NOTE — Discharge Instructions (Signed)
 Return for any problem.  ?

## 2024-01-14 NOTE — ED Notes (Signed)
 Pt discharged earlier today, pt has not left the lobby

## 2024-01-14 NOTE — ED Triage Notes (Signed)
 BIB [facility] for chronic/ recurrent BLE/ foot pain > 2 weeks. Verbalizes banged my feet up real good wearing tight sneakers. Endorses pain, redness, edema, peeling, and raw. TTP. Decreased ability to walk. Pt is homeless. Picked up in coffee shop. A&Ox4. VSS. Rates 10/10. BP high. H/o HTN. No meds PTA. CBG 99. Falling asleepin in chair during triage.

## 2024-01-15 DIAGNOSIS — Z5900 Homelessness unspecified: Secondary | ICD-10-CM | POA: Diagnosis not present

## 2024-01-15 DIAGNOSIS — F1210 Cannabis abuse, uncomplicated: Secondary | ICD-10-CM | POA: Diagnosis not present

## 2024-01-15 DIAGNOSIS — F1420 Cocaine dependence, uncomplicated: Secondary | ICD-10-CM | POA: Diagnosis not present

## 2024-01-15 DIAGNOSIS — F1290 Cannabis use, unspecified, uncomplicated: Secondary | ICD-10-CM | POA: Diagnosis not present

## 2024-01-15 DIAGNOSIS — Z765 Malingerer [conscious simulation]: Secondary | ICD-10-CM | POA: Diagnosis not present

## 2024-01-15 DIAGNOSIS — F1994 Other psychoactive substance use, unspecified with psychoactive substance-induced mood disorder: Secondary | ICD-10-CM | POA: Diagnosis not present

## 2024-01-15 DIAGNOSIS — F329 Major depressive disorder, single episode, unspecified: Secondary | ICD-10-CM

## 2024-01-15 NOTE — ED Notes (Signed)
 Pt wanded

## 2024-01-15 NOTE — ED Provider Notes (Signed)
 Emergency Medicine Observation Re-evaluation Note  Aaron Rubio is a 43 y.o. male, seen on rounds today.  Pt initially presented to the ED for complaints of Psychiatric Evaluation Currently, the patient is sleeping.  Physical Exam  BP 132/81   Pulse 77   Temp 98.4 F (36.9 C) (Oral)   Resp 16   Wt 84.4 kg   SpO2 100%   BMI 23.88 kg/m  Physical Exam General: Sleeping Cardiac: Extremities well-perfused Lungs: Breathing is unlabored Psych: Deferred  ED Course / MDM  EKG:EKG Interpretation Date/Time:  Monday January 14 2024 21:46:48 EDT Ventricular Rate:  82 PR Interval:  160 QRS Duration:  95 QT Interval:  405 QTC Calculation: 473 R Axis:   -4  Text Interpretation: Sinus rhythm No significant change since last tracing Confirmed by Patt Alm DEL [phone]) on 01/14/2024 9:53:51 PM  I have reviewed the labs performed to date as well as medications administered while in observation.  Recent changes in the last 24 hours include presentation to the emergency department yesterday for depression and suicidal ideation.  He has been medically cleared.  He was evaluated by TTS who recommends overnight observation in the ED with reevaluation this morning.  Plan  Current plan is for TTS reevaluation.    Melvenia Motto, MD 01/15/24 [phone]
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
## 2024-01-15 NOTE — ED Notes (Signed)
 Pts belongings bag placed and locked in locker 34 in [facility]

## 2024-01-15 NOTE — ED Notes (Signed)
 Pt has been dressed out and his belongings collected. Belongings consist of pants, shorts, and a shirt all have been placed in a pts belongings bag, labeled and placed in pts cabinet 5-8

## 2024-01-15 NOTE — Consult Note (Signed)
 [facility] Psychiatric Consult Initial  Patient Name: .Aaron Rubio  MRN: [id number]  DOB: [birth year]  Consult Order details:  Orders (From admission, onward)     Start     Ordered   01/10/24 1750  CONSULT TO CALL ACT TEAM       Ordering Provider: Beola Terrall RAMAN, PA-C  Provider:  (Not yet assigned)  Question:  Reason for Consult?  Answer:  Psych consult   01/10/24 1749             Mode of Visit: In person    Psychiatry Consult Evaluation  Service Date: January 15, 2024 LOS:  LOS: 0 days  Chief Complaint: Patient stated he is homeless, working his job is stressful, relationship issues is causing him to want to kill himself. Patient stated that he would use a gun to put In his mouth to shoot himself.  Primary Psychiatric Diagnoses  1.  Homelessness 2.  Major Depressive Disorder 2.  Cocaine use disorder, severe, dependence 3.  Cannabis use disorder, severe, dependence  Assessment   Aaron Rubio is a 43 y.o. AA male with a past psychiatric history of polysubstance abuse (cocaine and cannabis), MDD, paranoid schizophrenia, and schizoaffective disorder. Patient is medically clear at this time, as well as voluntary, per EDP team.  His current presentation of delusions, hallucinations, non compliant with medications is most consistent with decompensating schizophrenia. He meets criteria for inpatient psychiatric admission based on current symptoms.  Current outpatient psychotropic medications include Prozac , Atarax , Seroquel , Trazodone  and historically he has had a positive response to these medications. He was non compliant with medications prior to admission as evidenced by patient response. On initial examination, patient is delusional and responding to internal stimuli. Please see plan below for detailed recommendations. Spoke with Dr. Larina who is in agreement with recommendation for inpatient psychiatric admission.  Diagnoses:  Active Hospital problems: Principal  Problem:   Substance induced mood disorder (HCC) Active Problems:   Homelessness   Cannabis use disorder, severe, dependence (HCC)    Plan   ## Psychiatric Medication Recommendations:  Continue patient's home medication   ## Medical Decision Making Capacity: Not specifically addressed in this encounter   ## Further Work-up:  -- No further workup needed at this time EKG or UDS -- most recent EKG on 01/15/2024 had QtC of 473 -- Pertinent labwork reviewed earlier this admission includes: CBC, EKG, UDS-Positive for Cocaine and Cannabis.     ## Disposition:-- We recommend inpatient psychiatric hospitalization. Patient is under voluntary admission status at this time; please IVC if attempts to leave hospital. Patient is not safe for discharge due to active suicidal ideation with plan, ongoing psychosis, and impaired judgment. He meets criteria for inpatient psychiatric admission on voluntary or involuntary basis. Awaiting bed availability.   ## Behavioral / Environmental: -To minimize splitting of staff, assign one staff person to communicate all information from the team when feasible. or Utilize compassion and acknowledge the patient's experiences while setting clear and realistic expectations for care.                ## Safety and Observation Level:  - Based on my clinical evaluation, I estimate the patient to be at low risk of self harm in the current setting. - At this time, we recommend  routine. This decision is based on my review of the chart including patient's history and current presentation, interview of the patient, mental status examination, and consideration of suicide risk including evaluating suicidal ideation,  plan, intent, suicidal or self-harm behaviors, risk factors, and protective factors. This judgment is based on our ability to directly address suicide risk, implement suicide prevention strategies, and develop a safety plan while the patient is in the clinical setting.  Please contact our team if there is a concern that risk level has changed.   CSSR Risk Category:C-SSRS RISK CATEGORY: Low Risk   Suicide Risk Assessment: Patient has following modifiable risk factors for suicide: recklessness and medication noncompliance, which we are addressing by recommended inpatient psychiatric admission. Patient has following non-modifiable or demographic risk factors for suicide: male gender and psychiatric hospitalization Patient has the following protective factors against suicide: Supportive friends   Thank you for this consult request. Recommendations have been communicated to the primary team.  We will recommend inpatient psychiatric admission and continue to follow patient at this time.    Citlally Captain [name], PMHNP       History of Present Illness   Aaron Rubio is a 43 y.o. AA male with a past psychiatric history of polysubstance abuse (cocaine and cannabis), MDD, paranoid schizophrenia, and schizoaffective disorder, and pertinent medical comorbidities/history that include chronic homelessness with multiple accounts of malingering for secondary gain in the context of a need for housing, who presents this encounter by way of self for endorsements of worsening depression, suicidal ideations, and hallucinations, thus psychiatry was consulted for specialty evaluation and recommendations. Patient is medically clear at this time, as well as voluntary, per EDP team.   Aaron Rubio, 43 y.o., male patient seen face to face by this provider, consulted with Dr. Larina; and chart reviewed on 01/15/24.  On evaluation Aaron Rubio reports he came in because he has been constantly arguing with his wife. Patient stated he is homeless, working his job is stressful, relationship issues is causing him to want to kill himself. Patient stated that he would use a gun to put In his mouth to shoot himself. No other complaints at this time. Is guarded to where the gun  is, stating that it is hidden, but I have access to it if I need it. Patient presented as guarded during the session, offering minimal verbal responses and limited spontaneous speech. This represents a notable decrease in engagement compared to prior visits. Eye contact was intermittent, and patient required prompting to answer questions. Affect appeared constricted, and insight into current mental state was limited.  The patient has a chronic psychiatric history, including Major Depressive Disorder (MDD), paranoid schizophrenia, schizoaffective disorder, and polysubstance use disorder (cocaine and cannabis). He also has pertinent social history of chronic homelessness and is well known to multiple providers and systems for recurrent ED visits often associated with possible malingering for secondary gain, frequently related to securing shelter or basic needs.  During today's evaluation, the patient endorsed worsening depressive symptoms, stating that his job is very stressful and that relationship problems are causing him significant emotional distress. He clearly stated: "I want to kill myself. I would use a gun, put it in my mouth, and shoot myself." Patient also endorsed ongoing auditory hallucinations, though details were vague.  Given the specificity of the suicide plan, expressed intent, and multiple psychosocial stressors, the patient is deemed high risk and not safe for discharge. Although the patient has a known pattern of secondary gain behaviors, today's presentation meets threshold for inpatient psychiatric admission for safety, stabilization, and further diagnostic clarification.   Review of Systems  Constitutional:  Positive for malaise/fatigue.  Psychiatric/Behavioral:  Positive for depression, hallucinations (  Reports seeing shadows), substance abuse (Cocaine and cannabis daily) and suicidal ideas.   All other systems reviewed and are negative.    Psychiatric and Social History   Psychiatric History:  Information collected from chart review/patient  Prev Dx/Sx: Polysubstance abuse (cocaine and marijuana), MDD, paranoid schizophrenia, and schizoaffective disorder Current Psych Provider: None Home Meds (current): None Previous Med Trials: Multiple, most recent Haldol , mirtazapine , trazodone , hydroxyzine  Therapy: None  Prior Psych Hospitalization: Multiple, most recent per records, [facility] 12/24/2023 Prior Self Harm: Yes, reports x 1 suicide attempt 2008 Prior Violence: Yes, multiple accounts of becoming angry with staff and providers when recommended for discharge  Family Psych History: None reported Family Hx suicide: None reported  Social History:  Developmental Hx: None reported Educational Hx: High school Occupational Hx: Reports he just lost his job, applying for mental health disability though Legal Hx: Reports incarceration for 16 years for bank robbery, states he got out of prison 3 years ago Living Situation: Reports living in a house with wife and 3 kids, but per evaluation yesterday with his providers colleague, appreciably homeless chronically for 3 years Spiritual Hx: None reported  Access to weapons/lethal means: None endorsed or reported  Substance History Alcohol: Reports occasional use, none recently Tobacco: Daily Illicit drugs: Cocaine and cannabis daily, 3 years Prescription drug abuse: None reported Rehab hx: Reports none  Exam Findings  Physical Exam: As above Vital Signs:  Temp:  [97.9 F (36.6 C)-98.9 F (37.2 C)] 98.9 F (37.2 C) (07/15 1637) Pulse Rate:  [62-88] 62 (07/15 1637) Resp:  [15-18] 16 (07/15 1637) BP: (132-159)/(77-110) 138/98 (07/15 1637) SpO2:  [99 %-100 %] 100 % (07/15 1637) Weight:  [84.4 kg] 84.4 kg (07/14 1916) Blood pressure (!) 138/98, pulse 62, temperature 98.9 F (37.2 C), temperature source Oral, resp. rate 16, weight 84.4 kg, SpO2 100%. Body mass index is 23.88 kg/m.  Physical Exam Vitals and  nursing note reviewed.  Constitutional:      Comments: Incongruent interpersonal style   Pulmonary:     Effort: Pulmonary effort is normal.  Skin:    General: Skin is warm.  Neurological:     Mental Status: He is alert and oriented to person, place, and time.     Motor: No tremor or seizure activity.  Psychiatric:        Attention and Perception: Attention normal.        Mood and Affect: Mood is depressed. Affect is flat.        Speech: Speech normal.        Behavior: Behavior is withdrawn and actively hallucinating. Behavior is cooperative.        Thought Content: Thought content includes suicidal ideation. Thought content includes suicidal plan.        Cognition and Memory: Memory normal.        Judgment: Judgment is inappropriate.     Mental Status Exam: General Appearance: Normal weight unkept African-American male with euthymic interpersonal style  Orientation:  Full (Time, Place, and Person)  Memory:  WDL  Concentration:  Concentration: Fair and Attention Span: Fair  Recall:  Fair  Attention  Fair  Eye Contact:  Good  Speech:  Clear and Coherent and Normal Rate  Language:  Good  Volume:  Normal  Mood: Depressed  Affect:  Non-Congruent Guarded   Thought Process:  Coherent, Goal Directed, and Linear  Thought Content:  Negative and Logical  Suicidal Thoughts:  Yes.  with intent/plan  Homicidal Thoughts:  No  Judgement:  Intact  Insight:  Lacking  Psychomotor Activity:  Normal  Akathisia:  No  Fund of Knowledge:  Fair      Assets:  Manufacturing systems engineer Leisure Time Physical Health Resilience  Cognition:  WNL  ADL's:  Intact  AIMS (if indicated):   0     Other History   These have been pulled in through the EMR, reviewed, and updated if appropriate.  Family History:  The patient's family history is not on file.  Medical History: Past Medical History:  Diagnosis Date   Hypertension    Schizophrenia [facility])     Surgical History: No past surgical  history on file.   Medications:  No current facility-administered medications for this encounter.  Current Outpatient Medications:    haloperidol  (HALDOL ) 10 MG tablet, Take 1 tablet (10 mg total) by mouth daily. (Patient not taking: Reported on 01/05/2024), Disp: 30 tablet, Rfl: 0   haloperidol  (HALDOL ) 20 MG tablet, Take 1 tablet (20 mg total) by mouth at bedtime. (Patient not taking: Reported on 01/05/2024), Disp: 30 tablet, Rfl: 0   hydrochlorothiazide  (HYDRODIURIL ) 12.5 MG tablet, Take 1 tablet (12.5 mg total) by mouth daily. (Patient not taking: Reported on 01/05/2024), Disp: 30 tablet, Rfl: 0   hydrOXYzine  (ATARAX ) 25 MG tablet, Take 1 tablet (25 mg total) by mouth 3 (three) times daily as needed for anxiety. (Patient not taking: Reported on 01/05/2024), Disp: 30 tablet, Rfl: 0   mirtazapine  (REMERON ) 15 MG tablet, Take 1 tablet (15 mg total) by mouth at bedtime. (Patient not taking: Reported on 01/05/2024), Disp: 30 tablet, Rfl: 0   traZODone  (DESYREL ) 50 MG tablet, Take 1 tablet (50 mg total) by mouth at bedtime as needed for sleep. (Patient not taking: Reported on 01/05/2024), Disp: 30 tablet, Rfl: 0  Allergies: No Known Allergies  Amorah Sebring [name], PMHNP

## 2024-01-16 DIAGNOSIS — F1994 Other psychoactive substance use, unspecified with psychoactive substance-induced mood disorder: Secondary | ICD-10-CM

## 2024-01-16 DIAGNOSIS — F1290 Cannabis use, unspecified, uncomplicated: Secondary | ICD-10-CM

## 2024-01-16 DIAGNOSIS — Z765 Malingerer [conscious simulation]: Secondary | ICD-10-CM

## 2024-01-16 MED ORDER — HALOPERIDOL 5 MG PO TABS
10.0000 mg | ORAL_TABLET | Freq: Every day | ORAL | Status: DC
  Administered 2024-01-16: 10 mg via ORAL
  Filled 2024-01-16: qty 2

## 2024-01-16 MED ORDER — HYDROCHLOROTHIAZIDE 12.5 MG PO TABS
12.5000 mg | ORAL_TABLET | Freq: Every day | ORAL | Status: DC
  Administered 2024-01-16: 12.5 mg via ORAL
  Filled 2024-01-16: qty 1

## 2024-01-16 MED ORDER — MIRTAZAPINE 7.5 MG PO TABS
15.0000 mg | ORAL_TABLET | Freq: Every day | ORAL | Status: DC
  Administered 2024-01-16: 15 mg via ORAL
  Filled 2024-01-16: qty 2

## 2024-01-16 MED ORDER — HYDROXYZINE HCL 25 MG PO TABS: 25.0000 mg | ORAL_TABLET | Freq: Three times a day (TID) | ORAL | Status: DC | PRN

## 2024-01-16 NOTE — ED Notes (Signed)
 Pt requested shower. Pt given towels and new scrubs for such.

## 2024-01-16 NOTE — ED Provider Notes (Signed)
 Emergency Medicine Observation Re-evaluation Note  Aaron Rubio is a 43 y.o. male, seen on rounds today.  Pt initially presented to the ED for complaints of Psychiatric Evaluation due to self harming behaviors Currently, the patient is sleeping.  Physical Exam  BP 136/86 (BP Location: Right Arm)   Pulse 64   Temp 98.6 F (37 C) (Oral)   Resp 18   Wt 84.4 kg   SpO2 100%   BMI 23.88 kg/m  Physical Exam General: Calm Psych: Calm  ED Course / MDM  EKG:EKG Interpretation Date/Time:  Monday January 14 2024 21:46:48 EDT Ventricular Rate:  82 PR Interval:  160 QRS Duration:  95 QT Interval:  405 QTC Calculation: 473 R Axis:   -4  Text Interpretation: Sinus rhythm No significant change since last tracing Confirmed by Patt Alm DEL [phone]) on 01/14/2024 9:53:51 PM  I have reviewed the labs performed to date as well as medications administered while in observation.  Recent changes in the last 24 hours include behavior health evaluation recommendation for inpatient placement.  Plan  Current plan is for placement.    [name], MD 01/16/24 [phone]

## 2024-01-16 NOTE — Discharge Instructions (Signed)
 Based on the information that you have provided and the presenting issues the following homeless shelter have been provided below.  In case of an urgent crisis, you may contact the Mobile Crisis Unit with [facility] at [phone].              Homeless Shelters in [facility] NIGHT SHELTER) [address] [place], [place] Phone: [phone]   [facility] has been providing emergency shelter to those in need of a permanent residence for over 35 years. The [employer] shelter plays an important role in our community.   There are many life events that can pull someone into a downward spiral towards poverty that is very difficult to get out of. Homelessness is a problem that can affect anyone of us . [employer] is a safe and comforting place to stay, especially if you have experienced the hardship of street life.   [facility] provides a single bed and bedding to 100 adult men and women. The shelter welcomes all who are in need of housing, no one in real need is turned away unless space is not available.   While staying at [facility], guests are offered more than just a bed for a night. Hot meals are provided and every guest has access to case management services. Case managers provide assistance with finding housing, employment, or other services that will help them gain stability. Continuous stay is based on availability, capacity, and progress towards goals.   To contact the front desk of [facility] please call  [phone] ext [phone] or ext. [phone].      [facility] AND CHILDREN) [address] [place], [place]  [address] [phone]       [facility] [address], [place], [place] [address] Phone: [phone]     [facility]  [address] [place], [place]   [address] [phone] Population served: Male veterans 18+ with substance abuse/mental health issues Eligibility: By referral only     [facility] (Women only) [address] [place], [place] [address] Phone: [phone] Population served: Single women 18+ without dependents Documents required:  Valid ID & Social Security Card       The [employer] [employer]. Address: [address] [place], [place] [address] Hours:   Opens 9?AM Mon Phone: [phone]  The [facility] providers a continuum of housing services to those experiencing homelessness. They provide transitional [employer] and permanent supportive housing ([employer]) to disabled veterans experiencing homelessness. There is a fast track Rapid Rehousing program couples housing stability services with temporary financial assistance to quickly house individuals and families experiencing homelessness.   [employer] [address], [place] [address] Phone: [phone]      [facility] [address] [place], [place] [address] Phone: [phone]  Offers food and emergency or transitional housing to men, women, or families in need. Clients participate in programs and workshops developed to promote self-sufficiency and personal development. Call or walk in. Applications are accepted Monday, Wednesday, and Friday by appointment only. Need photo ID and proof of income    [facility] [facility] [employer] [address] [place], [place] [address] [phone] Population served: Men 18+, preference for disabled and/or veterans    [facility]  [facility]  [address] [place], [place], [place] [address] [phone] Population served: Families with children.      [facility] at [facility] [place] [facility] [employer]. [address] [place] [address] [phone] or [phone] Population served: Pregnant women with  or without children       [employer] [address], [place], [place] [address] Check in at 6:00PM for placement at a local shelter) Phone: [phone]   [facility] Eligibility:  Must be drug and alcohol free for at least 14 days or more at the time of application. This program serves males.  Houses Engineer, civil (consulting), Economist who serve six-month terms.  Houses are financially self-supporting; members split house expenses, which average $90.00 to $130.00 per person per week.  Any Resident who relapses must be immediately expelled. Call:  [phone]   [facility] Address: [address] [place], [place] [address]  Phone#: [phone]    [facility] Address: [address] [place], [place] [address] Phone: [phone]  -The [facility] provides food, shelter and other programs and services to the homeless men of [place] through our [employer] program.  By offering safe shelter, three meals a day, clean clothing, Biblical counseling, financial planning, vocational training, GED/education and employment assistance, we've helped mend the shattered lives of many homeless men since opening in [place].  We have approximately 267 beds available, with a max of 312 beds including mats for emergency situations and currently house an average of 270 men a night.  Prospective Client Check-In Information Photo ID Required (State/ Out of State/ [facility]) - if photo ID is not available, clients are required to have a printout of a police/sheriff's criminal history report. Help out with chores around the [facility]. No sex offender of any type (pending, charged, registered and/or any other sex related offenses) will be permitted to check in. Must be willing to abide by all rules, regulations, and policies established by the [employer]. The following will be provided - shelter,  food, clothing, and biblical counseling. If you or someone you know is in need of assistance at our [facility] shelter in [place], [place], please call [phone] ext. [phone].  Women Shelter for [employer] hours are Monday-Friday only.    [facility]          -(Please Call) Phone: [phone]   Provides housing and specialized case management focused on housing stability.         For your behavioral health needs, you are advised to follow up with an outpatient provider.  You may be eligible for ACT Team services, which would include more frequent visits with your provider, as well as in-home services.  The following providers offer ACT Team services.  Contact them at your earliest opportunity to ask about enrolling in their program:        [facility]        [address]        [place]        [phone]              [facility]      [address]      [place], [place] [address]      [phone]       [facility]      [address]  [place], [place] [address]      [phone]       [facility]      [address]      [place], [place] [address]      [phone]       [facility] ACT Team      [facility] [place] [facility]      [address]      [place], [place]  [address]      [phone]       [facility]      [address]      [place], [place] [address]      [phone]

## 2024-01-16 NOTE — ED Notes (Signed)
 Pt given snack.

## 2024-01-16 NOTE — ED Notes (Addendum)
 Patient in the room speaking with Act Team member. Camellia Daring from [employer]. After meeting with patient states the company will be working with the patient upon discharge he will just need a ride to get there.

## 2024-01-16 NOTE — ED Notes (Signed)
 Pt made 2nd call

## 2024-01-16 NOTE — ED Notes (Signed)
 Breakfast tray given.

## 2024-01-16 NOTE — ED Notes (Signed)
Patient received Lunch Tray. 

## 2024-01-16 NOTE — ED Notes (Signed)
Pt made 1 phone call. 

## 2024-01-16 NOTE — Care Management (Addendum)
 [facility] Care Management   Writer  met with the patient to discuss barriers.  Patient listed:  He has been homeless for over [place].  Both parents are deceased and he does not have any children.   He only has contact with a sister Kervens Roper [phone] The last tine he worked was in the 90's at [employer].   Patient reports that he was honorably discharged from the military Patient reports that he is single even though documentation in the epic chart reports that he is still married.  Patient denies any substance abuse.  However, epic documents a diagnosis of cocaine and cannabis dependency. On 01-14-24, epic labs reports that he tested positive for cocaine Patient report that he has been diagnosed with paranoid schizophrenia and he does take her psychiatric medication as prescribed.  The patient does not have a telephone    Patient gave verbal consent to speak to his sister Deloyd Handy,    Action Items: Contacting his sister([phone] [place]) to determine is she is able to allow the patient to live with her or if she can identify other family members that may allow him to stay at their home.  Contact the [facility] [phone]) to determine is he is eligible to receive any military benefits.  Writer provided the patient and the nursing staff the phone number to the [facility] so that he can complete an intake phone interview for possible placement.  Contacting other shelters outside of [place] to determine if he can receive a shelter bed in their county. Linking the patient with the [facility] so that he can obtain a Care Manager for case management services.  Refer the patient to ACTT services    11:14am  Addendum  Writer contacted the [facility] and they reported that the patient is not listed and it not eligible for any miliary benefits.   Patient has left 2 voice mail messages with the [facility] but has not been able to speak to anyone  in order to complete an phone intake assessment.   Writer left a HIPAA compliant voice mail message with the patient sister.   Writer placed a list of shelter resources in the patients discharge AVS.   Writer contacted [facility] [phone] and will complete the referral form for him to receive ACTT services.  [facility] reports that they have to screen the referral packet before they are able to set up an intake date and time for the patient.  Writer informed [facility] that he does not have a phone but we can provide him with a bus ticket so that he can come to their facility to find out if he qualifies for their program.   Clinical research associate provided targeted case management resources in the patients discharge AVS   12:20 Addendum   Clinical research associate consulted with the Medicaid intake worker at the [facility].  The intake worker Eliott Vicci) reports that the patient's Medicaid was not switched over in their system patient because they patient was incarcerated and his paperwork was not updated.  The intake worker reports that she will be able to switch the patient to [facility] and his Medicaid ID will be [id number].   Writer contacted [employer] and they are able to send out a clinician to the [facility] emergency room to meet with the patient and complete an intake assessment for the patient to receive services with their agency.  The clinician is Elvin  Delores [phone])    4:18pm  Addendum Writer consulted with [employer] [phone].  The contact person is [place].  Patient is able to go tomorrow (Thursday 10-18-23) for an intake to receive a shelter bed.    The shelter beds are first come first serve.  Therefore, the patient will need to go be early to the facility.  The address for the facility is [address] [place] in [place].  The intake hours are from 8am to 12pm.  It is suggested by the contact person at [facility] that the  patient get to the facility at 7am so that he is the first in line to receive a shelter bed.

## 2024-01-16 NOTE — Consult Note (Cosign Needed Addendum)
 [facility] Psychiatric Consult Follow-up  Patient Name: .Aaron Rubio  MRN: [id number]  DOB: 04/22/1981  Consult Order details:  Orders (From admission, onward)     Start     Ordered   01/10/24 1750  CONSULT TO CALL ACT TEAM       Ordering Provider: Beola Terrall RAMAN, PA-C  Provider:  (Not yet assigned)  Question:  Reason for Consult?  Answer:  Psych consult   01/10/24 1749             Mode of Visit: In person    Psychiatry Consult Evaluation  Service Date: January 16, 2024 LOS: 2 days Chief Complaint: Nothing changed   Primary Psychiatric Diagnoses  Malingering 2.  Homelessness 3.  Cocaine use disorder, severe, dependence 4.  Cannabis use disorder, severe, dependence  Assessment   Aaron Rubio is a 43 y.o. AA male with a past psychiatric history of polysubstance abuse (cocaine and cannabis), MDD, paranoid schizophrenia, and schizoaffective disorder, and pertinent medical comorbidities/history that include chronic homelessness with multiple accounts of malingering for secondary gain in the context of a need for housing, who presents this encounter by way of self for endorsements of worsening depression and suicidal ideations. Psychiatry was consulted for specialty evaluation and recommendations. Patient is medically clear at this time, as well as voluntary, per EDP team.  Of note, patient was seen and evaluated by this providers colleague at [facility] Emergency Department on July 11 and the determination at that time was malingering for secondary gain. In the context of chronic homelessness, as well as being well-known to the behavioral health service line at [facility] with 21 ED visits and 4 inpatient mental health hospitalizations in the last 3.5 months alone.  Patient is unwilling to participate in any interventions, outside of being admitted for inpatient mental health hospitalization, given the need for housing. Patient's treatment goals of being admitted  for temporary housing are inappropriate. Any safety benefit of hospitalization is mitigated by patient's lack of collaboration and engagement with the treatment team. Continued treatment in the hospital is delaying patient's engagement with outpatient services. Continued hospitalization is no longer benefiting the patient and may be unnecessarily delaying effective intervention.   Patient has not benefited from past hospitalizations under similar circumstances in terms of suicide risk modification or improvement in mental health or social conditions. Patient's repeated use of emergency department and inpatient services, instead of recommended outpatient follow-up, is ineffective in helping improvement. Inpatient hospitalization is not indicated, and the patient is refusing interventions that the clinical care team has offered in the emergency department to mitigate risk of self-harm, other than allowing admission.  Patient needs can be met at a lesser level of care. Prognosis is guarded, given his chronic non compliance with recommended care, and his inappropriate use of medical resources   Spoke with Dr. Larina who is in agreement with recommendation for psychiatric clearance, as well as additional recommendations listed below.  Diagnoses:  Active Hospital problems: Principal Problem:   Substance induced mood disorder (HCC) Active Problems:   Homelessness   Cannabis use disorder, severe, dependence (HCC)    Plan   # Malingering # Homelessness # Cocaine use disorder, severe, dependence # Cannabis use disorder, severe, dependence  ## Psychiatric Recommendations:   - Recommend safety return precautions - Recommend patient consider close outpatient follow-up with substance abuse resources given upon discharge - Recommend patient consider close outpatient follow-up with the [facility] behavioral health outpatient center for medication management/therapy  ##  Medical Decision Making  Capacity: Not specifically addressed in this encounter  ## Further Work-up: None at this time  ## Disposition:-- There are no psychiatric contraindications to discharge at this time  ## Behavioral / Environmental: -Routine agitation/safety precautions until discharge; safety return precautions upon discharge    ## Safety and Observation Level:  - Based on my clinical evaluation, I estimate the patient to be at low risk of self harm in the current setting and upon recommendation for discharge. - At this time, we recommend  routine. This decision is based on my review of the chart including patient's history and current presentation, interview of the patient, mental status examination, and consideration of suicide risk including evaluating suicidal ideation, plan, intent, suicidal or self-harm behaviors, risk factors, and protective factors. This judgment is based on our ability to directly address suicide risk, implement suicide prevention strategies, and develop a safety plan while the patient is in the clinical setting. Please contact our team if there is a concern that risk level has changed.  CSSR Risk Category:C-SSRS RISK CATEGORY: Moderate Risk  Suicide Risk Assessment: Patient has following modifiable risk factors for suicide: social isolation, medication noncompliance, and recent psychiatric hospitalization, which we are addressing by recommendations. Patient has following non-modifiable or demographic risk factors for suicide: male gender, history of suicide attempt, history of self harm behavior, and psychiatric hospitalization Patient has the following protective factors against suicide: Access to outpatient mental health care and Frustration tolerance  Thank you for this consult request. Recommendations have been communicated to the primary team.  We will sign off at this time.   Efrain DELENA Patient, NP       History of Present Illness   Aaron Rubio is a 43 y.o. AA male with a  past psychiatric history of polysubstance abuse (cocaine and cannabis), MDD, paranoid schizophrenia, and schizoaffective disorder, and pertinent medical comorbidities/history that include chronic homelessness with multiple accounts of malingering for secondary gain in the context of a need for housing, who presents this encounter by way of self for endorsements of worsening depression, suicidal ideations, and hallucinations, thus psychiatry was consulted for specialty evaluation and recommendations. Patient is medically clear at this time, as well as voluntary, per EDP team.  Aaron Rubio, is seen face to face by this provider, consulted with Dr. Larina; and chart reviewed on 01/16/24.  On evaluation Aaron Rubio reports I talked to the lady and I made the phone call in reference to a virtual conversation with social work Haematologist. He is cooperative with assessment. Upon evaluation, patient presents with symptomology that is most consistent with malingering, in the context of secondary gain for housing, due to chronic homelessness, as well as a cocaine and cannabis use disorder, severe, with dependence.  Patient reported 5 days ago Upon evaluation, patient endorses that he came in this encounter because yesterday morning he got into another fight with his wife in front of his 3 kids, and because of how intense the verbal back-and-forth was, with appreciable physical posturing, states that, It's just become all too much to handle, I'm tired of fighting with the wife, and endorses that this has led to the development of an abrupt onset of suicidal ideations, depression, and visual hallucinations; patient denies any other psychosocial stressors.    Today when speaking with social work Haematologist, patient reports 1) He has been homeless for over 4 years [place] bus station. 2) Both parents are deceased and he does not have any children.  3) He only has contact with a sister Lowell Makara [phone]  4) The last time he worked was in the 90's at [employer].  5) Patient reports that he is single even though documentation in the epic chart reports that he is still married. 6) Patient denies any substance abuse. However, epic documents a diagnosis of cocaine and cannabis dependency. On 01-14-24, epic labs reports that he tested positive for cocaine 7) Patient report that he has been diagnosed with paranoid schizophrenia and he does take his psychiatric medication as prescribed.   Patient endorses no history of outpatient psychiatric medication management and/or therapy service utilization.  Patient endorses a history of x 1 suicide attempt many years ago, but denies any history of self-injurious behavior.  The multiple inconsistencies in patient's reporting further support that he is utilizing the hospital for secondary gain.     During evaluation Aaron Rubio is laying in bed in no acute distress.  He is alert & oriented x 4, calm and superficially cooperative for this assessment.  His mood is calm with congruent affect.  He has normal speech, and behavior.  Objectively there is no evidence of psychosis/mania or delusional thinking. Pt does not appear to be responding to internal or external stimuli.  Patient is able to converse coherently, goal directed thoughts, no distractibility, or pre-occupation.  He denies homicidal ideation, psychosis, and paranoia; patient continues to endorse suicidal/self-harm ideation.  Patient answered questions appropriately.    Patient is unwilling to participate in any interventions, outside of being admitted for inpatient mental health hospitalization, given the need for housing. Patient's treatment goals of being admitted for temporary housing are inappropriate. Any safety benefit of hospitalization is mitigated by patient's lack of collaboration and engagement with the treatment team. Continued treatment in the hospital is delaying patient's engagement with outpatient  services. Continued hospitalization is no longer benefiting the patient and may be unnecessarily delaying effective intervention.   Patient has not benefited from past hospitalizations under similar circumstances in terms of suicide risk modification or improvement in mental health or social conditions. Patient's repeated use of emergency department and inpatient services, instead of recommended outpatient follow-up, is ineffective in helping improvement. Inpatient hospitalization is not indicated, and the patient is refusing interventions that the clinical care team has offered in the emergency department to mitigate risk of self-harm, other than allowing admission.  Patient needs can be met at a lesser level of care. Prognosis is guarded, given his chronic non compliance with recommended care, and his inappropriate use of medical resources.   I personally spent a total of 80 minutes in the care of the patient today including preparing to see the patient, getting/reviewing separately obtained history, performing a medically appropriate exam/evaluation, counseling and educating, referring and communicating with other health care professionals, documenting clinical information in the EHR, independently interpreting results, and coordinating care.   Review of Systems  Psychiatric/Behavioral:  Positive for substance abuse and suicidal ideas.   All other systems reviewed and are negative.    Psychiatric and Social History  Psychiatric History:  Information collected from chart review/patient  Prev Dx/Sx: Polysubstance abuse (cocaine and marijuana), MDD, paranoid schizophrenia, and schizoaffective disorder Current Psych Provider: None Home Meds (current): None Previous Med Trials: Multiple, most recent Haldol , mirtazapine , trazodone , hydroxyzine  Therapy: None  Prior Psych Hospitalization: Multiple, most recent per records, [facility] 12/24/2023 Prior Self Harm: Yes, reports x 1 suicide attempt  2008 Prior Violence: Yes, multiple accounts of becoming angry with staff and providers when  recommended for discharge  Family Psych History: None reported Family Hx suicide: None reported  Social History:  Developmental Hx: None reported Educational Hx: High school Occupational Hx: Reports he just lost his job, applying for mental health disability though Legal Hx: Reports incarceration for 16 years for bank robbery, states he got out of prison 3 years ago Living Situation: Reports living in a house with wife and 3 kids at admission, but per evaluation today, he is homeless and single without children  Spiritual Hx: None reported  Access to weapons/lethal means: None endorsed or reported  Substance History Alcohol: Reports occasional use, none recently Tobacco: Daily Illicit drugs: Cocaine and cannabis daily, 3 years Prescription drug abuse: None reported Rehab hx: Reports none  Exam Findings  Physical Exam: As above Vital Signs:  Temp:  [98.5 F (36.9 C)-98.9 F (37.2 C)] 98.6 F (37 C) (07/16 0650) Pulse Rate:  [62-69] 64 (07/16 0650) Resp:  [16-18] 18 (07/16 0650) BP: (136-145)/(77-98) 136/86 (07/16 0650) SpO2:  [100 %] 100 % (07/16 0650) Blood pressure 136/86, pulse 64, temperature 98.6 F (37 C), temperature source Oral, resp. rate 18, weight 84.4 kg, SpO2 100%. Body mass index is 23.88 kg/m.  Physical Exam Psychiatric:        Attention and Perception: Attention and perception normal.        Mood and Affect: Mood normal.        Speech: Speech normal.        Behavior: Behavior is cooperative.        Thought Content: Thought content includes suicidal ideation. Thought content includes suicidal plan.        Cognition and Memory: Cognition and memory normal.     Mental Status Exam: General Appearance: Casual  Orientation:  Full (Time, Place, and Person)  Memory:  WDL  Concentration:  Concentration: Fair and Attention Span: Fair  Recall:  Fair  Attention  Fair   Eye Contact:  Good  Speech:  Clear and Coherent and Normal Rate  Language:  Good  Volume:  Normal  Mood: Depressed  Affect:  Non-Congruent  Thought Process:  Coherent, Goal Directed, and Linear  Thought Content:  Negative and Logical  Suicidal Thoughts:  Yes.  with intent/plan  Homicidal Thoughts:  No  Judgement:  Intact  Insight:  Lacking  Psychomotor Activity:  Normal  Akathisia:  No  Fund of Knowledge:  Fair      Assets:  Manufacturing systems engineer Leisure Time Physical Health Resilience  Cognition:  WNL  ADL's:  Intact  AIMS (if indicated):   0     Other History   These have been pulled in through the EMR, reviewed, and updated if appropriate.  Family History:  The patient's family history is not on file.  Medical History: Past Medical History:  Diagnosis Date   Hypertension    Schizophrenia [facility])     Surgical History: No past surgical history on file.   Medications:   Current Facility-Administered Medications:    haloperidol  (HALDOL ) tablet 10 mg, 10 mg, Oral, Daily, [name], MD, 10 mg at 01/16/24 1031   hydrochlorothiazide  (HYDRODIURIL ) tablet 12.5 mg, 12.5 mg, Oral, Daily, [name], MD, 12.5 mg at 01/16/24 1031   hydrOXYzine  (ATARAX ) tablet 25 mg, 25 mg, Oral, TID PRN, [name], MD   mirtazapine  (REMERON ) tablet 15 mg, 15 mg, Oral, QHS, [name], MD  Current Outpatient Medications:    haloperidol  (HALDOL ) 10 MG tablet, Take 1 tablet (10 mg total) by mouth daily. (Patient not  taking: Reported on 01/05/2024), Disp: 30 tablet, Rfl: 0   haloperidol  (HALDOL ) 20 MG tablet, Take 1 tablet (20 mg total) by mouth at bedtime. (Patient not taking: Reported on 01/05/2024), Disp: 30 tablet, Rfl: 0   hydrochlorothiazide  (HYDRODIURIL ) 12.5 MG tablet, Take 1 tablet (12.5 mg total) by mouth daily. (Patient not taking: Reported on 01/05/2024), Disp: 30 tablet, Rfl: 0   hydrOXYzine  (ATARAX ) 25 MG tablet, Take 1 tablet (25 mg total) by mouth 3 (three)  times daily as needed for anxiety. (Patient not taking: Reported on 01/05/2024), Disp: 30 tablet, Rfl: 0   mirtazapine  (REMERON ) 15 MG tablet, Take 1 tablet (15 mg total) by mouth at bedtime. (Patient not taking: Reported on 01/05/2024), Disp: 30 tablet, Rfl: 0   traZODone  (DESYREL ) 50 MG tablet, Take 1 tablet (50 mg total) by mouth at bedtime as needed for sleep. (Patient not taking: Reported on 01/05/2024), Disp: 30 tablet, Rfl: 0  Allergies: No Known Allergies  Jordie Schreur A Tomoya Ringwald, NP

## 2024-01-17 NOTE — ED Provider Notes (Signed)
 Patient psychiatrically cleared yesterday.  Patient to go to [facility] this morning.  All psychiatry and follow-up ED notes reviewed.  Patient is medically cleared for discharge.   [name], MD 01/17/24 0600

## 2024-01-17 NOTE — ED Notes (Signed)
 Pt discharged. Cab voucher approved for taxi [facility]. [facility] called.

## 2024-01-17 NOTE — ED Notes (Signed)
 Pt given belongings and AVS. Pt given taxi voucher and ambulated to taxi.

## 2024-02-02 ENCOUNTER — Emergency Department (HOSPITAL_COMMUNITY)
Admission: EM | Admit: 2024-02-02 | Discharge: 2024-02-03 | Disposition: A | Discharge status: Home / Self Care | Attending: Emergency Medicine | Admitting: Emergency Medicine

## 2024-02-02 ENCOUNTER — Encounter (HOSPITAL_COMMUNITY): Payer: Self-pay | Admitting: Emergency Medicine

## 2024-02-02 ENCOUNTER — Other Ambulatory Visit: Payer: Self-pay

## 2024-02-02 DIAGNOSIS — Z765 Malingerer [conscious simulation]: Secondary | ICD-10-CM | POA: Diagnosis not present

## 2024-02-02 DIAGNOSIS — Z5900 Homelessness unspecified: Secondary | ICD-10-CM | POA: Insufficient documentation

## 2024-02-02 DIAGNOSIS — F17210 Nicotine dependence, cigarettes, uncomplicated: Secondary | ICD-10-CM | POA: Insufficient documentation

## 2024-02-02 DIAGNOSIS — R45851 Suicidal ideations: Secondary | ICD-10-CM | POA: Insufficient documentation

## 2024-02-02 DIAGNOSIS — F32A Depression, unspecified: Secondary | ICD-10-CM | POA: Diagnosis not present

## 2024-02-02 DIAGNOSIS — R4689 Other symptoms and signs involving appearance and behavior: Secondary | ICD-10-CM | POA: Diagnosis present

## 2024-02-02 DIAGNOSIS — F1420 Cocaine dependence, uncomplicated: Secondary | ICD-10-CM | POA: Diagnosis not present

## 2024-02-02 LAB — COMPREHENSIVE METABOLIC PANEL WITH GFR
ALT: 28 U/L (ref 0–44)
AST: 38 U/L (ref 15–41)
Albumin: 3.5 g/dL (ref 3.5–5.0)
Alkaline Phosphatase: 83 U/L (ref 38–126)
Anion gap: 9 (ref 5–15)
BUN: 18 mg/dL (ref 6–20)
CO2: 23 mmol/L (ref 22–32)
Calcium: 8.8 mg/dL — ABNORMAL LOW (ref 8.9–10.3)
Chloride: 108 mmol/L (ref 98–111)
Creatinine, Ser: 1.37 mg/dL — ABNORMAL HIGH (ref 0.61–1.24)
GFR, Estimated: >60 mL/min (ref >60)
Glucose, Bld: 131 mg/dL — ABNORMAL HIGH (ref 70–99)
Potassium: 3.7 mmol/L (ref 3.5–5.1)
Sodium: 140 mmol/L (ref 135–145)
Total Bilirubin: 0.6 mg/dL (ref 0.0–1.2)
Total Protein: 6.1 g/dL — ABNORMAL LOW (ref 6.5–8.1)

## 2024-02-02 LAB — ETHANOL: Alcohol, Ethyl (B): <15 mg/dL (ref <15)

## 2024-02-02 LAB — CBC
HCT: 39.1 % (ref 39.0–52.0)
Hemoglobin: 12.3 g/dL — ABNORMAL LOW (ref 13.0–17.0)
MCH: 26.5 pg (ref 26.0–34.0)
MCHC: 31.5 g/dL (ref 30.0–36.0)
MCV: 84.3 fL (ref 80.0–100.0)
Platelets: 230 K/uL (ref 150–400)
RBC: 4.64 MIL/uL (ref 4.22–5.81)
RDW: 14.1 % (ref 11.5–15.5)
WBC: 6.8 K/uL (ref 4.0–10.5)
nRBC: 0 % (ref 0.0–0.2)

## 2024-02-02 NOTE — ED Notes (Signed)
 Pt wanded by security at this time  ?

## 2024-02-02 NOTE — ED Provider Notes (Signed)
 [place] EMERGENCY DEPARTMENT AT [facility] Provider Note   CSN: [id number] Arrival date & time: 02/02/24  1918     Patient presents with: Suicidal   Aaron Rubio is a 43 y.o. male.   Patient to ED with complaint of hearing things that aren't there. He felt overwhelmed and feels he is in danger of self harm. History of multiple behavioral health evaluations, admissions. History of schizophrenia, polysubstance abuse, malingering, homelessness.   The history is provided by the patient. No language interpreter was used.       Prior to Admission medications   Medication Sig Start Date End Date Taking? Authorizing Provider  haloperidol  (HALDOL ) 10 MG tablet Take 1 tablet (10 mg total) by mouth daily. Patient not taking: Reported on 01/05/2024 01/03/24   Blair Robin H, NP  haloperidol  (HALDOL ) 20 MG tablet Take 1 tablet (20 mg total) by mouth at bedtime. Patient not taking: Reported on 01/05/2024 01/02/24   Blair Robin H, NP  hydrochlorothiazide  (HYDRODIURIL ) 12.5 MG tablet Take 1 tablet (12.5 mg total) by mouth daily. Patient not taking: Reported on 01/05/2024 01/03/24   Blair Robin H, NP  hydrOXYzine  (ATARAX ) 25 MG tablet Take 1 tablet (25 mg total) by mouth 3 (three) times daily as needed for anxiety. Patient not taking: Reported on 01/05/2024 01/02/24   Blair Robin H, NP  mirtazapine  (REMERON ) 15 MG tablet Take 1 tablet (15 mg total) by mouth at bedtime. Patient not taking: Reported on 01/05/2024 01/02/24   Blair Robin H, NP  traZODone  (DESYREL ) 50 MG tablet Take 1 tablet (50 mg total) by mouth at bedtime as needed for sleep. Patient not taking: Reported on 01/05/2024 01/02/24   Blair Robin DEL, NP    Allergies: Patient has no known allergies.    Review of Systems  Updated Vital Signs BP (!) 147/104 (BP Location: Left Arm)   Pulse 95   Temp 98.8 F (37.1 C) (Oral)   Resp 19   Ht 6' 2 (1.88 m)   Wt 84.4 kg   SpO2 95%   BMI 23.88 kg/m    Physical Exam Vitals and nursing note reviewed.  Constitutional:      Appearance: He is well-developed.  HENT:     Head: Normocephalic.  Cardiovascular:     Rate and Rhythm: Normal rate and regular rhythm.     Heart sounds: No murmur heard. Pulmonary:     Effort: Pulmonary effort is normal.     Breath sounds: Normal breath sounds. No wheezing, rhonchi or rales.  Abdominal:     General: Bowel sounds are normal.     Palpations: Abdomen is soft.     Tenderness: There is no abdominal tenderness. There is no guarding or rebound.  Musculoskeletal:        General: Normal range of motion.     Cervical back: Normal range of motion and neck supple.  Skin:    General: Skin is warm and dry.  Neurological:     General: No focal deficit present.     Mental Status: He is alert and oriented to person, place, and time.  Psychiatric:        Attention and Perception: He does not perceive auditory or visual hallucinations.        Thought Content: Thought content includes suicidal ideation.     Comments: Minimal eye contact. Verbal responses are soft, barely audible.      (all labs ordered are listed, but only abnormal results are displayed) Labs Reviewed  COMPREHENSIVE METABOLIC PANEL WITH GFR - Abnormal; Notable for the following components:      Result Value   Glucose, Bld 131 (*)    Creatinine, Ser 1.37 (*)    Calcium 8.8 (*)    Total Protein 6.1 (*)    All other components within normal limits  CBC - Abnormal; Notable for the following components:   Hemoglobin 12.3 (*)    All other components within normal limits  ETHANOL  RAPID URINE DRUG SCREEN, HOSP PERFORMED   Results for orders placed or performed during the hospital encounter of 02/02/24  Comprehensive metabolic panel   Collection Time: 02/02/24  8:04 PM  Result Value Ref Range   Sodium 140 135 - 145 mmol/L   Potassium 3.7 3.5 - 5.1 mmol/L   Chloride 108 98 - 111 mmol/L   CO2 23 22 - 32 mmol/L   Glucose, Bld 131 (H) 70  - 99 mg/dL   BUN 18 6 - 20 mg/dL   Creatinine, Ser 8.62 (H) 0.61 - 1.24 mg/dL   Calcium 8.8 (L) 8.9 - 10.3 mg/dL   Total Protein 6.1 (L) 6.5 - 8.1 g/dL   Albumin 3.5 3.5 - 5.0 g/dL   AST 38 15 - 41 U/L   ALT 28 0 - 44 U/L   Alkaline Phosphatase 83 38 - 126 U/L   Total Bilirubin 0.6 0.0 - 1.2 mg/dL   GFR, Estimated >39 >39 mL/min   Anion gap 9 5 - 15  Ethanol   Collection Time: 02/02/24  8:04 PM  Result Value Ref Range   Alcohol, Ethyl (B) <15 <15 mg/dL  cbc   Collection Time: 02/02/24  8:04 PM  Result Value Ref Range   WBC 6.8 4.0 - 10.5 K/uL   RBC 4.64 4.22 - 5.81 MIL/uL   Hemoglobin 12.3 (L) 13.0 - 17.0 g/dL   HCT 60.8 60.9 - 47.9 %   MCV 84.3 80.0 - 100.0 fL   MCH 26.5 26.0 - 34.0 pg   MCHC 31.5 30.0 - 36.0 g/dL   RDW 85.8 88.4 - 84.4 %   Platelets 230 150 - 400 K/uL   nRBC 0.0 0.0 - 0.2 %    EKG: None  Radiology: No results found.   Procedures   Medications Ordered in the ED - No data to display  Clinical Course as of 02/02/24 2337  Sat Feb 02, 2024  2150 Here with [facility], SI. History of same. Labs baseline/stable. TTS to see and recommend dispostiion. [SU]    Clinical Course User Index [SU] Odell Balls, PA-C                                 Medical Decision Making Amount and/or Complexity of Data Reviewed Labs: ordered.        Final diagnoses:  Depression, unspecified depression type    ED Discharge Orders     None          Odell Balls, PA-C 02/02/24 2338    Franklyn Sid SAILOR, MD 02/03/24 [phone]

## 2024-02-02 NOTE — ED Triage Notes (Signed)
 Pt reports hearing voices, having visual hallucinations, and suicidal ideation x 4 days, pt reports plan of overdosing

## 2024-02-02 NOTE — ED Notes (Signed)
 Pt dressed out in burgundy scrubs at this time, security called to wand pt

## 2024-02-02 NOTE — BH Assessment (Signed)
 TTS requested tele-psychiatry consult with [facility]. Created secure conversation including EDP, Pt's RN, and [facility] Co[facility]ators to facilitate consult. [facility] Coordinator will message with name of provider and scheduled consult time.    Pamalee Leyden, [facility], [facility] Triage Specialist

## 2024-02-03 DIAGNOSIS — F1420 Cocaine dependence, uncomplicated: Secondary | ICD-10-CM | POA: Diagnosis not present

## 2024-02-03 DIAGNOSIS — Z765 Malingerer [conscious simulation]: Secondary | ICD-10-CM | POA: Diagnosis not present

## 2024-02-03 DIAGNOSIS — Z5900 Homelessness unspecified: Secondary | ICD-10-CM

## 2024-02-03 LAB — RAPID URINE DRUG SCREEN, HOSP PERFORMED
Amphetamines: NOT DETECTED
Barbiturates: NOT DETECTED
Benzodiazepines: NOT DETECTED
Cocaine: POSITIVE — AB
Opiates: NOT DETECTED
Tetrahydrocannabinol: POSITIVE — AB

## 2024-02-03 NOTE — Discharge Instructions (Signed)
 If you have any thoughts about hurting others or yourself then please come back to the ED.   Please proceed to [employer].
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
## 2024-02-03 NOTE — ED Notes (Signed)
 Pt belongings collected, labeled and placed behind nurses station in front of hall B. 3 bags total.

## 2024-02-03 NOTE — ED Provider Notes (Signed)
 Emergency Medicine Observation Re-evaluation Note  Aaron Rubio is a 43 y.o. male, seen on rounds today.  Pt initially presented to the ED for complaints of Suicidal Currently, the patient is resting.  Physical Exam  BP (!) 149/99 (BP Location: Left Arm)   Pulse 72   Temp 98.2 F (36.8 C) (Oral)   Resp 17   Ht 6' 2 (1.88 m)   Wt 84.4 kg   SpO2 98%   BMI 23.88 kg/m  Physical Exam General: nad Cardiac:  Lungs:  Psych:   ED Course / MDM  EKG:   I have reviewed the labs performed to date as well as medications administered while in observation.  Recent changes in the last 24 hours include clear for discharge home..  Plan  Current plan is for psychiatry saw the patient.  Cleared the patient from a psychiatric standpoint.  Safe for discharge home.[name]  Spoke to the patient.  No SI no HI.  Patient will be discharged.  Medically and psychiatry clear.    Aaron Lavonia SAILOR, MD 02/03/24 [phone]

## 2024-02-03 NOTE — ED Notes (Signed)
 VS deferred at this time. Pt resting comfortable in stretcher. Rise and fall of chest noted.

## 2024-03-06 ENCOUNTER — Emergency Department (HOSPITAL_COMMUNITY)
Admission: EM | Admit: 2024-03-06 | Discharge: 2024-03-07 | Disposition: A | Discharge status: Home / Self Care | Attending: Emergency Medicine | Admitting: Emergency Medicine

## 2024-03-06 ENCOUNTER — Other Ambulatory Visit: Payer: Self-pay

## 2024-03-06 DIAGNOSIS — R45851 Suicidal ideations: Secondary | ICD-10-CM | POA: Insufficient documentation

## 2024-03-06 DIAGNOSIS — F1220 Cannabis dependence, uncomplicated: Secondary | ICD-10-CM | POA: Diagnosis present

## 2024-03-06 DIAGNOSIS — F329 Major depressive disorder, single episode, unspecified: Secondary | ICD-10-CM | POA: Insufficient documentation

## 2024-03-06 DIAGNOSIS — M79671 Pain in right foot: Secondary | ICD-10-CM | POA: Diagnosis not present

## 2024-03-06 DIAGNOSIS — Z765 Malingerer [conscious simulation]: Secondary | ICD-10-CM | POA: Diagnosis not present

## 2024-03-06 DIAGNOSIS — M79672 Pain in left foot: Secondary | ICD-10-CM | POA: Insufficient documentation

## 2024-03-06 DIAGNOSIS — Z5900 Homelessness unspecified: Secondary | ICD-10-CM | POA: Diagnosis not present

## 2024-03-06 DIAGNOSIS — F1420 Cocaine dependence, uncomplicated: Secondary | ICD-10-CM | POA: Diagnosis not present

## 2024-03-06 DIAGNOSIS — R440 Auditory hallucinations: Secondary | ICD-10-CM | POA: Diagnosis not present

## 2024-03-06 DIAGNOSIS — I10 Essential (primary) hypertension: Secondary | ICD-10-CM | POA: Diagnosis not present

## 2024-03-06 LAB — COMPREHENSIVE METABOLIC PANEL WITH GFR
ALT: 19 U/L (ref 0–44)
AST: 28 U/L (ref 15–41)
Albumin: 3.5 g/dL (ref 3.5–5.0)
Alkaline Phosphatase: 57 U/L (ref 38–126)
Anion gap: 8 (ref 5–15)
BUN: 9 mg/dL (ref 6–20)
CO2: 28 mmol/L (ref 22–32)
Calcium: 9 mg/dL (ref 8.9–10.3)
Chloride: 104 mmol/L (ref 98–111)
Creatinine, Ser: 1.02 mg/dL (ref 0.61–1.24)
GFR, Estimated: >60 mL/min (ref >60)
Glucose, Bld: 99 mg/dL (ref 70–99)
Potassium: 3.7 mmol/L (ref 3.5–5.1)
Sodium: 140 mmol/L (ref 135–145)
Total Bilirubin: 0.4 mg/dL (ref 0.0–1.2)
Total Protein: 5.9 g/dL — ABNORMAL LOW (ref 6.5–8.1)

## 2024-03-06 LAB — CBC
HCT: 45.6 % (ref 39.0–52.0)
Hemoglobin: 14.1 g/dL (ref 13.0–17.0)
MCH: 26.1 pg (ref 26.0–34.0)
MCHC: 30.9 g/dL (ref 30.0–36.0)
MCV: 84.3 fL (ref 80.0–100.0)
Platelets: 284 K/uL (ref 150–400)
RBC: 5.41 MIL/uL (ref 4.22–5.81)
RDW: 13.9 % (ref 11.5–15.5)
WBC: 7.2 K/uL (ref 4.0–10.5)
nRBC: 0 % (ref 0.0–0.2)

## 2024-03-06 LAB — ETHANOL: Alcohol, Ethyl (B): <15 mg/dL (ref <15)

## 2024-03-06 NOTE — ED Notes (Signed)
 Auditory hallucinations, voices telling him to hurt himself, denies wanting to hurt others. Pt is calm and cooperative,.

## 2024-03-06 NOTE — ED Triage Notes (Signed)
 PT BIB [facility] c/o of bilateral leg pain x3 days, has been walking more. Pt is having auditory hallucinations., voices are telling him to hurt himself. Confirms SI, denies HI.   164/84 86 99%RA CBG 107

## 2024-03-07 NOTE — ED Notes (Signed)
 Report Given to [facility] Nurse at this time
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
## 2024-03-07 NOTE — Final Consult Note (Addendum)
 Chart reviewed, follow-up reevaluation performed, and collaboration was performed with this provider's attending Dr. Merilee, who agrees with recommendations listed below.   Patient presents with symptomology that is most consistent with malingering and presents with no evidence of being an imminent risk to self or others. Patient does not require inpatient mental health hospitalization, but rather a lesser level of care, such as close outpatient follow-ups with community resources that have been provided to the patient.  Patient's prognosis is overall guarded, given inappropriate desire/utilization of emergency services frequently, in the context of secondary gain.  There is no psychiatric contraindication to discharge at this time.  CCA recommendation has been changed to psychiatric clearance.  Recommendations  - Recommend close outpatient follow-up with substance abuse resources given upon discharge - Recommend close outpatient follow-up with psychiatric medication management/therapy services - Recommend safety return precautions - Recommend abstinence from illicit substance use endorsed to be utilized.

## 2024-03-07 NOTE — ED Provider Notes (Signed)
 [place] EMERGENCY DEPARTMENT AT [facility] Provider Note   CSN: [id number] Arrival date & time: 03/06/24  2214     Patient presents with: Leg Pain and Suicidal   Aaron Rubio is a 43 y.o. male.   43 yo with pain in both feet and seeing visions. Symptoms started at 1pm Wednesday. Reports visions are doubtful. SI, plan to take too much medicine. Denies HI. Denies drugs or alcohol today.  States he is homeless and does a lot of walking and that is why his feet hurt, denies any injury to the feet.       Prior to Admission medications   Medication Sig Start Date End Date Taking? Authorizing Provider  haloperidol  (HALDOL ) 10 MG tablet Take 1 tablet (10 mg total) by mouth daily. Patient not taking: Reported on 01/05/2024 01/03/24   Blair Robin H, NP  haloperidol  (HALDOL ) 20 MG tablet Take 1 tablet (20 mg total) by mouth at bedtime. Patient not taking: Reported on 01/05/2024 01/02/24   Blair Robin H, NP  hydrochlorothiazide  (HYDRODIURIL ) 12.5 MG tablet Take 1 tablet (12.5 mg total) by mouth daily. Patient not taking: Reported on 01/05/2024 01/03/24   Blair Robin H, NP  hydrOXYzine  (ATARAX ) 25 MG tablet Take 1 tablet (25 mg total) by mouth 3 (three) times daily as needed for anxiety. Patient not taking: Reported on 01/05/2024 01/02/24   Blair Robin H, NP  mirtazapine  (REMERON ) 15 MG tablet Take 1 tablet (15 mg total) by mouth at bedtime. Patient not taking: Reported on 01/05/2024 01/02/24   Blair Robin H, NP  traZODone  (DESYREL ) 50 MG tablet Take 1 tablet (50 mg total) by mouth at bedtime as needed for sleep. Patient not taking: Reported on 01/05/2024 01/02/24   Blair Robin DEL, NP    Allergies: Patient has no known allergies.    Review of Systems Negative except as per HPI Updated Vital Signs BP (!) 147/80 (BP Location: Right Arm)   Pulse 75   Temp 98.2 F (36.8 C) (Oral)   Resp 18   Ht 6' 2 (1.88 m)   Wt 108 kg   SpO2 99%   BMI 30.56 kg/m    Physical Exam Vitals and nursing note reviewed.  Constitutional:      General: He is not in acute distress.    Appearance: He is well-developed. He is not diaphoretic.  HENT:     Head: Normocephalic and atraumatic.  Cardiovascular:     Pulses: Normal pulses.  Pulmonary:     Effort: Pulmonary effort is normal.  Musculoskeletal:        General: No swelling, tenderness, deformity or signs of injury. Normal range of motion.     Right lower leg: No edema.     Left lower leg: No edema.  Skin:    General: Skin is warm and dry.     Findings: No bruising, erythema or rash.  Neurological:     Mental Status: He is alert and oriented to person, place, and time.  Psychiatric:        Mood and Affect: Affect is flat.        Behavior: Behavior is withdrawn.        Thought Content: Thought content includes suicidal ideation. Thought content does not include homicidal ideation. Thought content includes suicidal plan.     (all labs ordered are listed, but only abnormal results are displayed) Labs Reviewed  COMPREHENSIVE METABOLIC PANEL WITH GFR - Abnormal; Notable for the following components:  Result Value   Total Protein 5.9 (*)    All other components within normal limits  ETHANOL  CBC  RAPID URINE DRUG SCREEN, HOSP PERFORMED    EKG: None  Radiology: No results found.   Procedures   Medications Ordered in the ED - No data to display                                  Medical Decision Making Amount and/or Complexity of Data Reviewed Labs: ordered.   This patient presents to the ED for concern of suicidal ideation, foot pain, this involves an extensive number of treatment options, and is a complaint that carries with it a high risk of complications and morbidity.  The differential diagnosis includes but not limited to psychosis, malingering,   Co morbidities / Chronic conditions that complicate the patient evaluation  Polysubstance abuse, schizophrenia, homelessness,  malingering, hypertension   Additional history obtained:  Additional history obtained from EMR External records from outside source obtained and reviewed including prior labs on file   Lab Tests:  I Ordered, and personally interpreted labs.  The pertinent results include: CBC within normals.  CMP within normal notes.  Alcohol negative.  UDS pending collection.   Problem List / ED Course / Critical interventions / Medication management  43 year old male with complaint of pain in his feet due to being homeless and walking a lot.  His feet are examined and are without acute findings.  Patient was medically cleared for behavioral health evaluation and disposition.  Behavioral health is recommended for reassessment in the morning Medically cleared for behavioral health evaluation and disposition. I have reviewed the patients home medicines and have made adjustments as needed   Consultations Obtained:  I requested consultation with the behavioral health team,  and discussed lab and imaging findings as well as pertinent plan - they recommend: Reevaluation in the morning   Social Determinants of Health:  Homeless   Test / Admission - Considered:  Behavioral health was evaluated recommends overnight observation with reevaluation in the morning      Final diagnoses:  Suicidal ideation  Pain in both feet    ED Discharge Orders     None          Beverley Leita DELENA DEVONNA 03/07/24 0531    Palumbo, April, MD 03/07/24 [phone]

## 2024-03-07 NOTE — ED Notes (Signed)
 Pt wanded by security.

## 2024-03-07 NOTE — ED Provider Notes (Signed)
 Patient cleared by psychiatry.  Discharge.  No complaints otherwise.   Ruthe Cornet, DO 03/07/24 [phone]

## 2024-03-14 ENCOUNTER — Emergency Department (HOSPITAL_COMMUNITY)
Admission: EM | Admit: 2024-03-14 | Discharge: 2024-03-15 | Disposition: A | Discharge status: Home / Self Care | Attending: Emergency Medicine | Admitting: Emergency Medicine

## 2024-03-14 ENCOUNTER — Other Ambulatory Visit: Payer: Self-pay

## 2024-03-14 DIAGNOSIS — Z765 Malingerer [conscious simulation]: Secondary | ICD-10-CM | POA: Diagnosis not present

## 2024-03-14 DIAGNOSIS — M79672 Pain in left foot: Secondary | ICD-10-CM | POA: Insufficient documentation

## 2024-03-14 DIAGNOSIS — Z5900 Homelessness unspecified: Secondary | ICD-10-CM | POA: Insufficient documentation

## 2024-03-14 DIAGNOSIS — M79671 Pain in right foot: Secondary | ICD-10-CM | POA: Diagnosis present

## 2024-03-14 DIAGNOSIS — R440 Auditory hallucinations: Secondary | ICD-10-CM | POA: Diagnosis present

## 2024-03-14 DIAGNOSIS — R45851 Suicidal ideations: Secondary | ICD-10-CM | POA: Diagnosis not present

## 2024-03-14 MED ORDER — ACETAMINOPHEN 325 MG PO TABS
650.0000 mg | ORAL_TABLET | Freq: Four times a day (QID) | ORAL | Status: AC | PRN
  Administered 2024-03-14: 650 mg via ORAL
  Filled 2024-03-14: qty 2

## 2024-03-14 NOTE — ED Triage Notes (Signed)
 Pt has BL foot pain and left hip pain. Pain has been going on for 4 days after walking on some rocks. Pt is ambulatory. Has not taken otc meds. Pt ambulatory

## 2024-03-15 ENCOUNTER — Emergency Department (HOSPITAL_COMMUNITY)
Admission: EM | Admit: 2024-03-15 | Discharge: 2024-03-16 | Disposition: A | Discharge status: Home / Self Care | Source: Home / Self Care | Attending: Emergency Medicine | Admitting: Emergency Medicine

## 2024-03-15 ENCOUNTER — Encounter (HOSPITAL_COMMUNITY): Payer: Self-pay

## 2024-03-15 DIAGNOSIS — Z765 Malingerer [conscious simulation]: Secondary | ICD-10-CM | POA: Insufficient documentation

## 2024-03-15 LAB — I-STAT CHEM 8, ED
BUN: 12 mg/dL (ref 6–20)
Calcium, Ion: 1.19 mmol/L (ref 1.15–1.40)
Chloride: 104 mmol/L (ref 98–111)
Creatinine, Ser: 1.1 mg/dL (ref 0.61–1.24)
Glucose, Bld: 93 mg/dL (ref 70–99)
HCT: 41 % (ref 39.0–52.0)
Hemoglobin: 13.9 g/dL (ref 13.0–17.0)
Potassium: 4.1 mmol/L (ref 3.5–5.1)
Sodium: 141 mmol/L (ref 135–145)
TCO2: 26 mmol/L (ref 22–32)

## 2024-03-15 MED ORDER — ACETAMINOPHEN 325 MG PO TABS
650.0000 mg | ORAL_TABLET | Freq: Once | ORAL | Status: AC
  Administered 2024-03-15: 650 mg via ORAL
  Filled 2024-03-15: qty 2

## 2024-03-15 NOTE — ED Notes (Signed)
 Patient belongings placed in cabinet behind nurses station. Patient has been screened by security.

## 2024-03-15 NOTE — ED Triage Notes (Signed)
 PT complaints of hallucinations, report visual shadows following him, hearing voices, feeling pins and needles on feet and legs, and itching in arms.   Pt admit the use of cocaine, last intake today.   Admit suicidal ideations with intentions of OD for the past two days.   Present on ED voluntarily, requested tylenol .

## 2024-03-15 NOTE — ED Provider Notes (Signed)
  [facility]-EMERGENCY DEPT [facility] Emergency Department Provider Note MRN:  [id number]  Arrival date & time: 03/15/24     Chief Complaint   Medical Clearance   History of Present Illness   Aaron Rubio is a 43 y.o. year-old male presents to the ED with chief complaint of police Nations.  States that he feels like shadows are following him and that he has been hearing voices.  He continues to complain of pins-and-needles sensation in his feet and legs, which he associates with walking long distances.  Reports using cocaine earlier today.  States that he is having suicidal ideations and intends to overdose.  History of polysubstance abuse, paranoid schizophrenia, homelessness, and malingering.  History provided by patient. {RB interpreter (Optional):27221}  Review of Systems  Pertinent positive and negative review of systems noted in HPI.    Physical Exam   Vitals:   03/15/24 2251 03/15/24 2259  BP: (!) 149/105   Pulse: 78   Resp: 16   Temp: 98.8 F (37.1 C)   SpO2: 100% 100%    CONSTITUTIONAL:  non toxic-appearing, NAD NEURO:  Alert and oriented x 3, CN 3-12 grossly intact EYES:  eyes equal and reactive ENT/NECK:  Supple, no stridor  CARDIO:  normal rate, regular rhythm, appears well-perfused  PULM:  No respiratory distress,  GI/GU:  non-distended,  MSK/SPINE:  No gross deformities, no edema, moves all extremities  SKIN:  no rash, atraumatic   *Additional and/or pertinent findings included in MDM below  Diagnostic and Interventional Summary    EKG Interpretation Date/Time:    Ventricular Rate:    PR Interval:    QRS Duration:    QT Interval:    QTC Calculation:   R Axis:      Text Interpretation:        *** Labs Reviewed  COMPREHENSIVE METABOLIC PANEL WITH GFR  ETHANOL  CBC  URINE DRUG SCREEN    No orders to display    Medications - No data to display   Procedures  /  Critical Care Procedures  ED Course and Medical Decision Making  I  have reviewed the triage vital signs, the nursing notes, and pertinent available records from the EMR.  Social Determinants Affecting Complexity of Care: Patient is homelessness suffers from drug abuse/addiction. {rbsocialsolutions:27068}  ED Course:    Medical Decision Making     {rbcpddx (Optional):29772:::1} {rbabdddx (Optional):29773:s::1}  Consultants: {rbconsultants:27072}   Treatment and Plan: {rbadmissionvdc:27069}  {rbattending:27073}  Final Clinical Impressions(s) / ED Diagnoses  No diagnosis found.  ED Discharge Orders     None         Discharge Instructions Discussed with and Provided to Patient:   Discharge Instructions   None

## 2024-03-15 NOTE — ED Provider Notes (Signed)
 [facility]ER[facility]ENCY DEPT [facility] Emergency Department Provider Note MRN:  [id number]  Arrival date & time: 03/15/24     Chief Complaint   Hip Pain   History of Present Illness   Aaron Rubio is a 43 y.o. year-old male presents to the ED with chief complaint of bilateral foot pain.  He attributes this to walking long distances each day.  He also states that he feels dehydrated.  He denies any specific trauma or injury.  He has been seen 23 times in the past 6 months.  He denies treatment PTA.  History provided by patient.   Review of Systems  Pertinent positive and negative review of systems noted in HPI.    Physical Exam   Vitals:   03/14/24 2112 03/15/24 0124  BP: (!) 143/91 132/87  Pulse: 73 89  Resp: 16 20  Temp: 98.3 F (36.8 C) 98.7 F (37.1 C)  SpO2: 100% 99%    CONSTITUTIONAL:  non toxic-appearing, NAD NEURO:  Alert and oriented x 3, CN 3-12 grossly intact EYES:  eyes equal and reactive ENT/NECK:  Supple, no stridor  CARDIO:  normal rate, regular rhythm, appears well-perfused  PULM:  No respiratory distress, CTAB GI/GU:  non-distended,  MSK/SPINE:  No gross deformities, no edema, moves all extremities  SKIN:  no rash, atraumatic   *Additional and/or pertinent findings included in MDM below  Diagnostic and Interventional Summary    EKG Interpretation Date/Time:    Ventricular Rate:    PR Interval:    QRS Duration:    QT Interval:    QTC Calculation:   R Axis:      Text Interpretation:         Labs Reviewed  I-STAT CHEM 8, ED    No orders to display    Medications  acetaminophen  (TYLENOL ) tablet 650 mg (650 mg Oral Given 03/14/24 2149)     Procedures  /  Critical Care Procedures  ED Course and Medical Decision Making  I have reviewed the triage vital signs, the nursing notes, and pertinent available records from the EMR.  Social Determinants Affecting Complexity of Care: Patient has no clinically significant social  determinants affecting this chief complaint..   ED Course:    Medical Decision Making Patient here with acute on chronic bilateral foot pain that he associates with walking long distances each day.  He has history of housing instability.  He also states he feels dehydrated.  Chem-8 is reassuring.  He does not have any abnormality on his physical exam.  We discussed that this could be overuse versus tendinitis or plantar fasciitis.  I have advised him to take OTC medications and to follow-up with sports medicine.  Shoes and socks were removed.  Risk OTC drugs.         Consultants: No consultations were needed in caring for this patient.   Treatment and Plan: Emergency department workup does not suggest an emergent condition requiring admission or immediate intervention beyond  what has been performed at this time. The patient is safe for discharge and has  been instructed to return immediately for worsening symptoms, change in  symptoms or any other concerns    Final Clinical Impressions(s) / ED Diagnoses     ICD-10-CM   1. Pain in both feet  M79.671    M79.672       ED Discharge Orders     None         Discharge Instructions Discussed with and Provided to Patient:  Discharge Instructions   None      Vicky Charleston, DEVONNA 03/15/24 9367    [name], MD 03/15/24 2329

## 2024-03-16 ENCOUNTER — Other Ambulatory Visit: Payer: Self-pay

## 2024-03-16 ENCOUNTER — Emergency Department (HOSPITAL_COMMUNITY): Admission: EM | Admit: 2024-03-16 | Discharge: 2024-03-17 | Disposition: A | Discharge status: Home / Self Care

## 2024-03-16 ENCOUNTER — Emergency Department (HOSPITAL_COMMUNITY): Admission: EM | Admit: 2024-03-16 | Discharge: 2024-03-16 | Disposition: A | Discharge status: Home / Self Care

## 2024-03-16 ENCOUNTER — Encounter (HOSPITAL_COMMUNITY): Payer: Self-pay

## 2024-03-16 DIAGNOSIS — W228XXA Striking against or struck by other objects, initial encounter: Secondary | ICD-10-CM | POA: Insufficient documentation

## 2024-03-16 DIAGNOSIS — I10 Essential (primary) hypertension: Secondary | ICD-10-CM | POA: Insufficient documentation

## 2024-03-16 DIAGNOSIS — S90121A Contusion of right lesser toe(s) without damage to nail, initial encounter: Secondary | ICD-10-CM | POA: Insufficient documentation

## 2024-03-16 DIAGNOSIS — Z5321 Procedure and treatment not carried out due to patient leaving prior to being seen by health care provider: Secondary | ICD-10-CM | POA: Diagnosis not present

## 2024-03-16 DIAGNOSIS — S90129A Contusion of unspecified lesser toe(s) without damage to nail, initial encounter: Secondary | ICD-10-CM

## 2024-03-16 DIAGNOSIS — T63301A Toxic effect of unspecified spider venom, accidental (unintentional), initial encounter: Secondary | ICD-10-CM | POA: Insufficient documentation

## 2024-03-16 DIAGNOSIS — M79674 Pain in right toe(s): Secondary | ICD-10-CM | POA: Diagnosis present

## 2024-03-16 LAB — URINE DRUG SCREEN
Amphetamines: NEGATIVE
Barbiturates: NEGATIVE
Benzodiazepines: NEGATIVE
Cocaine: POSITIVE — AB
Fentanyl: NEGATIVE
Methadone Scn, Ur: NEGATIVE
Opiates: NEGATIVE
Tetrahydrocannabinol: POSITIVE — AB

## 2024-03-16 LAB — COMPREHENSIVE METABOLIC PANEL WITH GFR
ALT: 23 U/L (ref 0–44)
AST: 30 U/L (ref 15–41)
Albumin: 4.1 g/dL (ref 3.5–5.0)
Alkaline Phosphatase: 58 U/L (ref 38–126)
Anion gap: 12 (ref 5–15)
BUN: 14 mg/dL (ref 6–20)
CO2: 23 mmol/L (ref 22–32)
Calcium: 9.5 mg/dL (ref 8.9–10.3)
Chloride: 104 mmol/L (ref 98–111)
Creatinine, Ser: 1.07 mg/dL (ref 0.61–1.24)
GFR, Estimated: >60 mL/min (ref >60)
Glucose, Bld: 83 mg/dL (ref 70–99)
Potassium: 4 mmol/L (ref 3.5–5.1)
Sodium: 139 mmol/L (ref 135–145)
Total Bilirubin: 0.5 mg/dL (ref 0.0–1.2)
Total Protein: 6.3 g/dL — ABNORMAL LOW (ref 6.5–8.1)

## 2024-03-16 LAB — CBC
HCT: 43.9 % (ref 39.0–52.0)
Hemoglobin: 13.2 g/dL (ref 13.0–17.0)
MCH: 25.6 pg — ABNORMAL LOW (ref 26.0–34.0)
MCHC: 30.1 g/dL (ref 30.0–36.0)
MCV: 85.2 fL (ref 80.0–100.0)
Platelets: 228 K/uL (ref 150–400)
RBC: 5.15 MIL/uL (ref 4.22–5.81)
RDW: 13.8 % (ref 11.5–15.5)
WBC: 7.4 K/uL (ref 4.0–10.5)
nRBC: 0 % (ref 0.0–0.2)

## 2024-03-16 LAB — ETHANOL: Alcohol, Ethyl (B): <15 mg/dL (ref <15)

## 2024-03-16 NOTE — ED Triage Notes (Signed)
 Pt reports spider bite on pinky toe.

## 2024-03-16 NOTE — ED Triage Notes (Signed)
 Pt states that he thinks that he was bitten by a spider on his 5th right toe. Pt states that he noticed his toe was red and painful x 2 days ago.

## 2024-03-16 NOTE — ED Notes (Signed)
 Called x3 no awnser

## 2024-03-17 ENCOUNTER — Emergency Department (HOSPITAL_COMMUNITY)

## 2024-03-17 ENCOUNTER — Other Ambulatory Visit (HOSPITAL_COMMUNITY): Payer: Self-pay

## 2024-03-17 MED ORDER — IBUPROFEN 800 MG PO TABS
800.0000 mg | ORAL_TABLET | Freq: Once | ORAL | Status: AC
  Administered 2024-03-17: 800 mg via ORAL
  Filled 2024-03-17: qty 1

## 2024-03-17 MED ORDER — IBUPROFEN 600 MG PO TABS
600.0000 mg | ORAL_TABLET | Freq: Four times a day (QID) | ORAL | 0 refills | Status: DC | PRN
  Filled 2024-03-17: qty 30, 8d supply, fill #0

## 2024-03-17 NOTE — ED Provider Notes (Signed)
  EMERGENCY DEPARTMENT AT [facility] Provider Note   CSN: [id number] Arrival date & time: 03/16/24  2114     Patient presents with: Insect Bite   Aaron Rubio is a 43 y.o. male.   43 year old male with past medical history of hypertension and schizophrenia presents emergency department today with pain in his right toe.  The patient states that he did stub his toe a few days ago.  He has been having pain when he walks since then.  He denies any other injuries.  Initially thought that he might of gotten bit by a spider but did not see any bite.  He came to the emergency department today for further evaluation regarding this.        Prior to Admission medications   Medication Sig Start Date End Date Taking? Authorizing Provider  ibuprofen  (ADVIL ) 600 MG tablet Take 1 tablet (600 mg total) by mouth every 6 (six) hours as needed. 03/17/24  Yes Ula Prentice SAUNDERS, MD  haloperidol  (HALDOL ) 10 MG tablet Take 1 tablet (10 mg total) by mouth daily. Patient not taking: Reported on 01/05/2024 01/03/24   Blair Robin H, NP  haloperidol  (HALDOL ) 20 MG tablet Take 1 tablet (20 mg total) by mouth at bedtime. Patient not taking: Reported on 01/05/2024 01/02/24   Blair Robin H, NP  hydrochlorothiazide  (HYDRODIURIL ) 12.5 MG tablet Take 1 tablet (12.5 mg total) by mouth daily. Patient not taking: Reported on 01/05/2024 01/03/24   Blair Robin H, NP  hydrOXYzine  (ATARAX ) 25 MG tablet Take 1 tablet (25 mg total) by mouth 3 (three) times daily as needed for anxiety. Patient not taking: Reported on 01/05/2024 01/02/24   Blair Robin H, NP  mirtazapine  (REMERON ) 15 MG tablet Take 1 tablet (15 mg total) by mouth at bedtime. Patient not taking: Reported on 01/05/2024 01/02/24   Blair Robin H, NP  traZODone  (DESYREL ) 50 MG tablet Take 1 tablet (50 mg total) by mouth at bedtime as needed for sleep. Patient not taking: Reported on 01/05/2024 01/02/24   Blair Robin DEL, NP     Allergies: Patient has no known allergies.    Review of Systems  Musculoskeletal:        Right fifth toe pain  All other systems reviewed and are negative.   Updated Vital Signs BP (!) 147/97 (BP Location: Left Arm)   Pulse 71   Temp 98.3 F (36.8 C) (Oral)   Resp 14   SpO2 100%   Physical Exam Vitals and nursing note reviewed.   Gen: NAD Extremities: no calf tenderness, no edema Vascular: 2+ radial pulses bilaterally, 2+ DP pulses bilaterally MSK: There is some mild swelling noted to the right fifth toe with no overlying erythema noted and normal capillary refill Psyc: acting appropriately   (all labs ordered are listed, but only abnormal results are displayed) Labs Reviewed - No data to display  EKG: None  Radiology: DG Foot Complete Right Result Date: 03/17/2024 CLINICAL DATA:  5th toe pain/injury. EXAM: RIGHT FOOT COMPLETE - 3+ VIEW COMPARISON:  None Available. FINDINGS: No acute displaced fracture. No dislocation. No aggressive osseous lesion. No significant degenerative changes. No focal soft tissue swelling. No radiopaque foreign bodies. IMPRESSION: No acute osseous abnormality of the right foot. Electronically Signed   By: Ree Molt M.D.   On: 03/17/2024 08:13     Procedures   Medications Ordered in the ED  ibuprofen  (ADVIL ) tablet 800 mg (800 mg Oral Given 03/17/24 0744)  Medical Decision Making 43 year old male with past medical history of hypertension and schizophrenia presenting to the emergency department today with right fifth toe pain.  I will further evaluate the patient here with an x-ray to evaluate for acute fracture.  This does not appear to be secondary to cellulitis noted do not appreciate any issues on his vascular exam.  The patient is neurovascular intact I do not think this is due to acute limb ischemia or other emergent etiology.  Will give the patient ibuprofen  and obtain x-ray to evaluate for  fracture.  If this is negative patient will be discharged with return precautions.  The patient's x-ray does not show any acute findings.  He is discharged with return precautions.  Amount and/or Complexity of Data Reviewed Radiology: ordered.  Risk Prescription drug management.        Final diagnoses:  Contusion of toe without damage to nail, unspecified laterality, unspecified toe, initial encounter    ED Discharge Orders          Ordered    ibuprofen  (ADVIL ) 600 MG tablet  Every 6 hours PRN        03/17/24 0823               Ula Prentice SAUNDERS, MD 03/17/24 [phone]

## 2024-03-17 NOTE — ED Notes (Signed)
 Patient transported to X-ray

## 2024-03-17 NOTE — Discharge Instructions (Signed)
 Your x-ray did not show any concerning findings.  Please take the ibuprofen  and follow-up with your doctor.  Return to the ER for worsening symptoms.

## 2024-03-27 ENCOUNTER — Other Ambulatory Visit (HOSPITAL_COMMUNITY): Payer: Self-pay

## 2024-04-01 ENCOUNTER — Other Ambulatory Visit (HOSPITAL_COMMUNITY): Payer: Self-pay

## 2024-04-02 ENCOUNTER — Emergency Department (HOSPITAL_COMMUNITY)

## 2024-04-02 ENCOUNTER — Encounter (HOSPITAL_COMMUNITY): Payer: Self-pay | Admitting: *Deleted

## 2024-04-02 ENCOUNTER — Emergency Department (HOSPITAL_COMMUNITY)
Admission: EM | Admit: 2024-04-02 | Discharge: 2024-04-02 | Disposition: A | Discharge status: Home / Self Care | Source: Home / Self Care | Attending: Emergency Medicine | Admitting: Emergency Medicine

## 2024-04-02 ENCOUNTER — Encounter (HOSPITAL_COMMUNITY): Payer: Self-pay | Admitting: Emergency Medicine

## 2024-04-02 ENCOUNTER — Emergency Department (HOSPITAL_COMMUNITY)
Admission: EM | Admit: 2024-04-02 | Discharge: 2024-04-02 | Disposition: A | Discharge status: Home / Self Care | Attending: Emergency Medicine | Admitting: Emergency Medicine

## 2024-04-02 ENCOUNTER — Other Ambulatory Visit: Payer: Self-pay

## 2024-04-02 DIAGNOSIS — M79672 Pain in left foot: Secondary | ICD-10-CM | POA: Insufficient documentation

## 2024-04-02 DIAGNOSIS — Z5900 Homelessness unspecified: Secondary | ICD-10-CM | POA: Insufficient documentation

## 2024-04-02 DIAGNOSIS — G8929 Other chronic pain: Secondary | ICD-10-CM | POA: Insufficient documentation

## 2024-04-02 DIAGNOSIS — M545 Low back pain, unspecified: Secondary | ICD-10-CM | POA: Insufficient documentation

## 2024-04-02 MED ORDER — CYCLOBENZAPRINE HCL 10 MG PO TABS
10.0000 mg | ORAL_TABLET | Freq: Two times a day (BID) | ORAL | 0 refills | Status: DC | PRN
Start: 2024-04-02 — End: 2024-04-11
  Filled 2024-04-02: qty 20, 10d supply, fill #0

## 2024-04-02 MED ORDER — ACETAMINOPHEN 500 MG PO TABS
1000.0000 mg | ORAL_TABLET | Freq: Once | ORAL | Status: AC
  Administered 2024-04-02: 1000 mg via ORAL
  Filled 2024-04-02: qty 2

## 2024-04-02 MED ORDER — IBUPROFEN 400 MG PO TABS
600.0000 mg | ORAL_TABLET | Freq: Once | ORAL | Status: AC
  Administered 2024-04-02: 600 mg via ORAL
  Filled 2024-04-02 (×2): qty 1

## 2024-04-02 MED ORDER — IBUPROFEN 800 MG PO TABS
ORAL_TABLET | ORAL | Status: AC
  Filled 2024-04-02: qty 1

## 2024-04-02 MED ORDER — LIDOCAINE 5 % EX PTCH
1.0000 | MEDICATED_PATCH | CUTANEOUS | Status: DC
  Administered 2024-04-02: 1 via TRANSDERMAL
  Filled 2024-04-02: qty 1

## 2024-04-02 MED ORDER — IBUPROFEN 600 MG PO TABS
600.0000 mg | ORAL_TABLET | Freq: Four times a day (QID) | ORAL | 0 refills | Status: DC | PRN
  Filled 2024-04-02: qty 30, 8d supply, fill #0

## 2024-04-02 MED ORDER — IBUPROFEN 800 MG PO TABS
800.0000 mg | ORAL_TABLET | Freq: Once | ORAL | Status: AC
Start: 2024-04-02 — End: 2024-04-02
  Administered 2024-04-02: 800 mg via ORAL

## 2024-04-02 MED ORDER — ACETAMINOPHEN 500 MG PO TABS: 1000.0000 mg | ORAL_TABLET | Freq: Once | ORAL | Status: DC

## 2024-04-02 NOTE — ED Triage Notes (Signed)
 Pt ambulatory to triage, steady gait,  endorses lower left back pain for about 2 weeks, no injury.

## 2024-04-02 NOTE — Discharge Instructions (Addendum)
 You have been evaluated for your lower back pain.  Fortunately x-ray today did not show any concerning finding.  There are some mild degenerative changes.  You may take muscle relaxant and anti-inflammatory medication as needed but if pain persist you may follow-up with orthopedic specialist for further care.

## 2024-04-02 NOTE — ED Notes (Signed)
 EDP Penna to bedside to reassure pt of care plan laid out by EDPA Nivia
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
## 2024-04-02 NOTE — ED Notes (Signed)
 Paper work reviewed with pt, bus pass given per pt request. Pt is leaving in  no new onset distress for home.

## 2024-04-02 NOTE — Discharge Instructions (Signed)
 You were seen in the ER today for evaluation of your chronic symptoms. Please follow up with your PCP. I have included the information for one in case you are not already established. You can take Tylenol  1000mg  every 6 hours as needed for pain. You can used topical lidocaine  patches that can be bought over the counter. I have included more information into the discharge paperwork for you to review. If you have any concerns, new or worsening symptoms, please return to the nearest ER for re-evaluation.

## 2024-04-02 NOTE — ED Triage Notes (Signed)
 Patient here with pain to the L foot, pinky toe  for 2 weeks. Denies trauma to the foot. Ambulatory to tiage.
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
## 2024-04-03 ENCOUNTER — Encounter (HOSPITAL_COMMUNITY): Payer: Self-pay | Admitting: Emergency Medicine

## 2024-04-03 ENCOUNTER — Other Ambulatory Visit (HOSPITAL_COMMUNITY): Payer: Self-pay

## 2024-04-03 ENCOUNTER — Emergency Department (HOSPITAL_COMMUNITY)
Admission: EM | Admit: 2024-04-03 | Discharge: 2024-04-03 | Discharge status: Against Medical Advice | Attending: Emergency Medicine | Admitting: Emergency Medicine

## 2024-04-03 DIAGNOSIS — W938XXA Exposure to other excessive cold of man-made origin, initial encounter: Secondary | ICD-10-CM | POA: Insufficient documentation

## 2024-04-03 DIAGNOSIS — M79672 Pain in left foot: Secondary | ICD-10-CM | POA: Diagnosis present

## 2024-04-03 DIAGNOSIS — Z5321 Procedure and treatment not carried out due to patient leaving prior to being seen by health care provider: Secondary | ICD-10-CM | POA: Diagnosis not present

## 2024-04-03 DIAGNOSIS — M79671 Pain in right foot: Secondary | ICD-10-CM | POA: Diagnosis not present

## 2024-04-03 NOTE — ED Provider Notes (Signed)
 Patient eloped before being seen.  Unable to evaluate patient as he was not seen before leaving.   Aaron Rubio, [place] 04/03/24 9348    Darra Fonda MATSU, MD 04/04/24 [phone]

## 2024-04-03 NOTE — ED Notes (Signed)
 Pt no longer on stretcher or anywhere in area. Pt eloped

## 2024-04-03 NOTE — ED Triage Notes (Signed)
 Patient BIB [facility] for bilateral feet pain and being cold.

## 2024-04-03 NOTE — ED Provider Triage Note (Signed)
 Emergency Medicine Provider Triage Evaluation Note  Aaron Rubio , a 43 y.o. male  was evaluated in triage.  Pt complains of bilateral foot pain which he attributes to walking. Has been seen two times today at OSH. States he is trying to stay afloat. Reports he had nowhere else to go tonight and got cold so called EMS. No trauma.  Review of Systems  Positive:  Negative:   Physical Exam  BP (!) 137/97   Pulse 76   Temp 97.9 F (36.6 C)   Resp 18   Wt 108 kg   SpO2 100%   BMI 30.57 kg/m  Gen:   Awake, no distress   Resp:  Normal effort  MSK:   Moves extremities without difficulty  Other:    Medical Decision Making  Medically screening exam initiated at 2:59 AM.  Appropriate orders placed.  Aaron Rubio was informed that the remainder of the evaluation will be completed by another provider, this initial triage assessment does not replace that evaluation, and the importance of remaining in the ED until their evaluation is complete.     Ruthell Lonni FALCON, PA-C 04/03/24 0300

## 2024-04-04 ENCOUNTER — Encounter (HOSPITAL_COMMUNITY): Payer: Self-pay | Admitting: *Deleted

## 2024-04-04 ENCOUNTER — Other Ambulatory Visit: Payer: Self-pay

## 2024-04-04 ENCOUNTER — Emergency Department (HOSPITAL_COMMUNITY)
Admission: EM | Admit: 2024-04-04 | Discharge: 2024-04-04 | Disposition: A | Discharge status: Home / Self Care | Attending: Emergency Medicine | Admitting: Emergency Medicine

## 2024-04-04 DIAGNOSIS — R519 Headache, unspecified: Secondary | ICD-10-CM | POA: Insufficient documentation

## 2024-04-04 DIAGNOSIS — Z765 Malingerer [conscious simulation]: Secondary | ICD-10-CM | POA: Diagnosis not present

## 2024-04-04 MED ORDER — ACETAMINOPHEN 500 MG PO TABS
1000.0000 mg | ORAL_TABLET | Freq: Once | ORAL | Status: AC
  Administered 2024-04-04: 1000 mg via ORAL
  Filled 2024-04-04: qty 2

## 2024-04-04 NOTE — ED Triage Notes (Signed)
 Patient reports he is here for observation for an update to be sent to his PCP.

## 2024-04-04 NOTE — ED Triage Notes (Signed)
 Pt reports lower body pain, back pain-worse to his position in how he sits, and he is also requesting a new lidocaine  patch for his left foot pain.

## 2024-04-04 NOTE — ED Provider Notes (Signed)
 [place] EMERGENCY DEPARTMENT AT [facility] Provider Note   CSN: [id number] Arrival date & time: 04/04/24  1843     Patient presents with: Back Pain   Aaron Rubio is a 43 y.o. male with extensive history of chronic paranoid schizophrenia, multiple sites substance use disorders, and multiple visits for malingering.  Presents to the ED today stating that he wanted observation but does not specify the reason why.  Further states that he has a headache, had informed triage that he had a lower body pain and requesting lidocaine  patch for foot pain.  On assessment, patient states that he has a headache, but declines to answer any questions during exam.    Back Pain Associated symptoms: headaches        Prior to Admission medications   Medication Sig Start Date End Date Taking? Authorizing Provider  cyclobenzaprine  (FLEXERIL ) 10 MG tablet Take 1 tablet (10 mg total) by mouth 2 (two) times daily as needed for muscle spasms. 04/02/24   Nivia Colon, PA-C  hydrochlorothiazide  (HYDRODIURIL ) 12.5 MG tablet Take 1 tablet (12.5 mg total) by mouth daily. Patient not taking: Reported on 01/05/2024 01/03/24   Blair Robin H, NP  ibuprofen  (ADVIL ) 600 MG tablet Take 1 tablet (600 mg total) by mouth every 6 (six) hours as needed. 04/02/24   Nivia Colon, PA-C    Allergies: Patient has no known allergies.    Review of Systems  Musculoskeletal:  Positive for back pain.  Neurological:  Positive for headaches.  All other systems reviewed and are negative.   Updated Vital Signs BP 137/84   Pulse 88   Temp 98.8 F (37.1 C)   Resp 18   SpO2 100%   Physical Exam Vitals and nursing note reviewed.  HENT:     Head: Normocephalic and atraumatic.  Eyes:     General: No scleral icterus.    Conjunctiva/sclera: Conjunctivae normal.  Pulmonary:     Effort: Pulmonary effort is normal.  Abdominal:     General: Abdomen is flat.  Musculoskeletal:     Cervical back: Normal range  of motion and neck supple.  Skin:    Findings: No rash.  Neurological:     Mental Status: He is alert.  Psychiatric:        Mood and Affect: Mood normal.     (all labs ordered are listed, but only abnormal results are displayed) Labs Reviewed - No data to display  EKG: None  Radiology: No results found.   Procedures   Medications Ordered in the ED  acetaminophen  (TYLENOL ) tablet 1,000 mg (has no administration in time range)                                    Medical Decision Making Risk OTC drugs.   During exam, patient declined exam, was informed that without exam and without being able to obtain a thorough patient history that he would be symptomatically treated for his headache and discharged.  Based on the lack of clinical exam, and inability to obtain a more thorough history, at this time plan is for symptomatic management as previously mentioned in discharge.     Final diagnoses:  Malingering  Nonintractable headache, unspecified chronicity pattern, unspecified headache type    ED Discharge Orders     None          Myriam Dorn BROCKS, [place] 04/04/24 2137    Patt,  Alm Macho, MD 04/04/24 2245

## 2024-04-06 ENCOUNTER — Encounter (HOSPITAL_COMMUNITY): Payer: Self-pay | Admitting: Emergency Medicine

## 2024-04-06 ENCOUNTER — Ambulatory Visit (HOSPITAL_COMMUNITY): Admission: EM | Admit: 2024-04-06 | Discharge: 2024-04-07 | Disposition: A | Source: Intra-hospital

## 2024-04-06 ENCOUNTER — Emergency Department (HOSPITAL_COMMUNITY)
Admission: EM | Admit: 2024-04-06 | Discharge: 2024-04-06 | Disposition: A | Discharge status: Home / Self Care | Attending: Emergency Medicine | Admitting: Emergency Medicine

## 2024-04-06 DIAGNOSIS — Z765 Malingerer [conscious simulation]: Secondary | ICD-10-CM

## 2024-04-06 DIAGNOSIS — F259 Schizoaffective disorder, unspecified: Secondary | ICD-10-CM | POA: Diagnosis present

## 2024-04-06 DIAGNOSIS — Z91128 Patient's intentional underdosing of medication regimen for other reason: Secondary | ICD-10-CM | POA: Insufficient documentation

## 2024-04-06 DIAGNOSIS — Z5902 Unsheltered homelessness: Secondary | ICD-10-CM | POA: Insufficient documentation

## 2024-04-06 DIAGNOSIS — T43596A Underdosing of other antipsychotics and neuroleptics, initial encounter: Secondary | ICD-10-CM | POA: Insufficient documentation

## 2024-04-06 DIAGNOSIS — F1424 Cocaine dependence with cocaine-induced mood disorder: Secondary | ICD-10-CM | POA: Diagnosis not present

## 2024-04-06 DIAGNOSIS — F209 Schizophrenia, unspecified: Secondary | ICD-10-CM

## 2024-04-06 DIAGNOSIS — F1420 Cocaine dependence, uncomplicated: Secondary | ICD-10-CM

## 2024-04-06 DIAGNOSIS — F32A Depression, unspecified: Secondary | ICD-10-CM | POA: Diagnosis present

## 2024-04-06 DIAGNOSIS — F200 Paranoid schizophrenia: Secondary | ICD-10-CM | POA: Diagnosis present

## 2024-04-06 DIAGNOSIS — Z5900 Homelessness unspecified: Secondary | ICD-10-CM

## 2024-04-06 LAB — COMPREHENSIVE METABOLIC PANEL WITH GFR
ALT: 18 U/L (ref 0–44)
AST: 29 U/L (ref 15–41)
Albumin: 4.1 g/dL (ref 3.5–5.0)
Alkaline Phosphatase: 58 U/L (ref 38–126)
Anion gap: 13 (ref 5–15)
BUN: 17 mg/dL (ref 6–20)
CO2: 24 mmol/L (ref 22–32)
Calcium: 9.3 mg/dL (ref 8.9–10.3)
Chloride: 101 mmol/L (ref 98–111)
Creatinine, Ser: 1.13 mg/dL (ref 0.61–1.24)
GFR, Estimated: >60 mL/min (ref >60)
Glucose, Bld: 70 mg/dL (ref 70–99)
Potassium: 4.2 mmol/L (ref 3.5–5.1)
Sodium: 137 mmol/L (ref 135–145)
Total Bilirubin: 0.8 mg/dL (ref 0.0–1.2)
Total Protein: 6.1 g/dL — ABNORMAL LOW (ref 6.5–8.1)

## 2024-04-06 LAB — URINE DRUG SCREEN
Amphetamines: NEGATIVE
Barbiturates: NEGATIVE
Benzodiazepines: NEGATIVE
Cocaine: POSITIVE — AB
Fentanyl: NEGATIVE
Methadone Scn, Ur: NEGATIVE
Opiates: NEGATIVE
Tetrahydrocannabinol: NEGATIVE

## 2024-04-06 LAB — CBC WITH DIFFERENTIAL/PLATELET
Abs Immature Granulocytes: 0.05 K/uL (ref 0.00–0.07)
Basophils Absolute: 0.1 K/uL (ref 0.0–0.1)
Basophils Relative: 1 %
Eosinophils Absolute: 0.1 K/uL (ref 0.0–0.5)
Eosinophils Relative: 1 %
HCT: 46.6 % (ref 39.0–52.0)
Hemoglobin: 14.3 g/dL (ref 13.0–17.0)
Immature Granulocytes: 1 %
Lymphocytes Relative: 20 %
Lymphs Abs: 2.2 K/uL (ref 0.7–4.0)
MCH: 25.1 pg — ABNORMAL LOW (ref 26.0–34.0)
MCHC: 30.7 g/dL (ref 30.0–36.0)
MCV: 81.9 fL (ref 80.0–100.0)
Monocytes Absolute: 1 K/uL (ref 0.1–1.0)
Monocytes Relative: 9 %
Neutro Abs: 7.4 K/uL (ref 1.7–7.7)
Neutrophils Relative %: 68 %
Platelets: 272 K/uL (ref 150–400)
RBC: 5.69 MIL/uL (ref 4.22–5.81)
RDW: 13.6 % (ref 11.5–15.5)
WBC: 10.8 K/uL — ABNORMAL HIGH (ref 4.0–10.5)
nRBC: 0 % (ref 0.0–0.2)

## 2024-04-06 LAB — ETHANOL: Alcohol, Ethyl (B): <15 mg/dL (ref <15)

## 2024-04-06 LAB — ACETAMINOPHEN LEVEL: Acetaminophen (Tylenol), Serum: <10 ug/mL — ABNORMAL LOW (ref 10–30)

## 2024-04-06 LAB — SALICYLATE LEVEL: Salicylate Lvl: <7 mg/dL — ABNORMAL LOW (ref 7.0–30.0)

## 2024-04-06 MED ORDER — RISPERIDONE 1 MG PO TABS: 1.0000 mg | ORAL_TABLET | Freq: Two times a day (BID) | ORAL | Status: DC

## 2024-04-06 MED ORDER — DIPHENHYDRAMINE HCL 50 MG/ML IJ SOLN: 50.0000 mg | Freq: Three times a day (TID) | INTRAMUSCULAR | Status: DC | PRN

## 2024-04-06 MED ORDER — LORAZEPAM 1 MG PO TABS: 1.0000 mg | ORAL_TABLET | ORAL | Status: DC | PRN

## 2024-04-06 MED ORDER — HYDROXYZINE HCL 25 MG PO TABS: 25.0000 mg | ORAL_TABLET | Freq: Three times a day (TID) | ORAL | Status: DC | PRN

## 2024-04-06 MED ORDER — HALOPERIDOL LACTATE 5 MG/ML IJ SOLN: 10.0000 mg | Freq: Three times a day (TID) | INTRAMUSCULAR | Status: DC | PRN

## 2024-04-06 MED ORDER — LORAZEPAM 2 MG/ML IJ SOLN: 2.0000 mg | Freq: Three times a day (TID) | INTRAMUSCULAR | Status: DC | PRN

## 2024-04-06 MED ORDER — RISPERIDONE 1 MG PO TABS
1.0000 mg | ORAL_TABLET | Freq: Two times a day (BID) | ORAL | Status: DC
  Administered 2024-04-06 – 2024-04-07 (×2): 1 mg via ORAL
  Filled 2024-04-06 (×2): qty 1

## 2024-04-06 MED ORDER — MAGNESIUM HYDROXIDE 400 MG/5ML PO SUSP: 30.0000 mL | Freq: Every day | ORAL | Status: DC | PRN

## 2024-04-06 MED ORDER — DIPHENHYDRAMINE HCL 50 MG PO CAPS: 50.0000 mg | ORAL_CAPSULE | Freq: Three times a day (TID) | ORAL | Status: DC | PRN

## 2024-04-06 MED ORDER — HALOPERIDOL 5 MG PO TABS: 5.0000 mg | ORAL_TABLET | Freq: Three times a day (TID) | ORAL | Status: DC | PRN

## 2024-04-06 MED ORDER — ALUM & MAG HYDROXIDE-SIMETH 200-200-20 MG/5ML PO SUSP: 30.0000 mL | ORAL | Status: DC | PRN

## 2024-04-06 MED ORDER — RISPERIDONE 0.5 MG PO TBDP
1.0000 mg | ORAL_TABLET | Freq: Two times a day (BID) | ORAL | Status: DC
  Administered 2024-04-06: 1 mg via ORAL
  Filled 2024-04-06: qty 2

## 2024-04-06 MED ORDER — ACETAMINOPHEN 325 MG PO TABS: 650.0000 mg | ORAL_TABLET | Freq: Four times a day (QID) | ORAL | Status: DC | PRN

## 2024-04-06 MED ORDER — RISPERIDONE 1 MG PO TBDP: 1.0000 mg | ORAL_TABLET | Freq: Two times a day (BID) | ORAL | Status: DC

## 2024-04-06 MED ORDER — TRAZODONE HCL 50 MG PO TABS
50.0000 mg | ORAL_TABLET | Freq: Every evening | ORAL | Status: DC | PRN
  Administered 2024-04-06: 50 mg via ORAL
  Filled 2024-04-06: qty 1

## 2024-04-06 MED ORDER — HALOPERIDOL LACTATE 5 MG/ML IJ SOLN: 5.0000 mg | Freq: Three times a day (TID) | INTRAMUSCULAR | Status: DC | PRN

## 2024-04-06 MED ORDER — OLANZAPINE 5 MG PO TBDP
5.0000 mg | ORAL_TABLET | Freq: Three times a day (TID) | ORAL | Status: DC | PRN
  Filled 2024-04-06: qty 1

## 2024-04-06 MED ORDER — TRAZODONE HCL 100 MG PO TABS: 100.0000 mg | ORAL_TABLET | Freq: Every evening | ORAL | Status: DC | PRN

## 2024-04-06 NOTE — ED Notes (Signed)
 Pt transferred from [facility] voluntarily to [facility] for overnight obs w/ reevaluation in the morning.

## 2024-04-06 NOTE — Consult Note (Signed)
 [facility] Psychiatric Consult Initial  Patient Name: .Aaron Rubio  MRN: [id number]  DOB: 1981-07-02  Consult Order details:  Orders (From admission, onward)     Start     Ordered   04/06/24 0751  CONSULT TO CALL ACT TEAM       Ordering Provider: Freddi Hamilton, MD  Provider:  (Not yet assigned)  Question:  Reason for Consult?  Answer:  Psych consult   04/06/24 0751             Mode of Visit: In person    Psychiatry Consult Evaluation  Service Date: April 06, 2024 LOS:  LOS: 0 days  Chief Complaint I'm having a problem with my dad  Primary Psychiatric Diagnoses  Schizophrenia 2.  Malingering   Assessment  Aaron Rubio is a 43 y.o. male admitted: Presented to the EDfor 04/06/2024  7:27 AM for presented to the ED reporting auditory hallucinations. He carries the psychiatric diagnoses of MDD, polysubstance abuse, schizophrenia and schizoaffective disorder and has a past medical history of  HTN.   His current presentation of paranoid psychosis is most consistent with decompensated schizophrenia. He meets criteria for an observation bed based on acute psychosis and the need to restart his medications.  Current outpatient psychotropic medications include risperidone  and historically he has had a positive response to these medications. He was not compliant with medications prior to admission as evidenced by patient report that he did not take his medications or follow up after any discharge. On initial examination, patient is paranoid and psychotic. Please see plan below for detailed recommendations.   Diagnoses:  Active Hospital problems: Principal Problem:   Paranoid schizophrenia (HCC) Active Problems:   Malingering    Plan   ## Psychiatric Medication Recommendations:  Restart home medication --risperidone  1mg  PO BID  ## Medical Decision Making Capacity: Not specifically addressed in this encounter  ## Further Work-up:   -- most recent EKG on  04/06/2024 had QtC of 472 -- Pertinent labwork reviewed earlier this admission includes: CBC, CMP, alcohol, salicylate, acetaminophen  and UDS   ## Disposition:-- We recommend transfer to [facility]. For observation while restarting risperidone .  ## Behavioral / Environmental: - No specific recommendations at this time.     ## Safety and Observation Level:  - Based on my clinical evaluation, I estimate the patient to be at low risk of self harm in the current setting. - At this time, we recommend  routine. This decision is based on my review of the chart including patient's history and current presentation, interview of the patient, mental status examination, and consideration of suicide risk including evaluating suicidal ideation, plan, intent, suicidal or self-harm behaviors, risk factors, and protective factors. This judgment is based on our ability to directly address suicide risk, implement suicide prevention strategies, and develop a safety plan while the patient is in the clinical setting. Please contact our team if there is a concern that risk level has changed.  CSSR Risk Category:C-SSRS RISK CATEGORY: No Risk  Suicide Risk Assessment: Patient has following modifiable risk factors for suicide: medication noncompliance and active mental illness (to encompass adhd, tbi, mania, psychosis, trauma reaction), which we are addressing by recommending overnight observation. Patient has following non-modifiable or demographic risk factors for suicide: male gender and psychiatric hospitalization Patient has the following protective factors against suicide: Access to outpatient mental health care  Thank you for this consult request. Recommendations have been communicated to the primary team.  We will  continue to follow at this time.   Kerstie Agent A Kline, NP       History of Present Illness  Relevant Aspects of Hospital ED Course:  Admitted on 04/06/2024 for presented to  the ED reporting auditory hallucinations. He carries the psychiatric diagnoses of MDD, polysubstance abuse, schizophrenia and schizoaffective disorder and has a past medical history of  HTN.   Patient Report:  Aaron Rubio, is seen face to face by this provider, consulted with Dr. Larina; and chart reviewed on 04/06/24.  On evaluation Aaron Rubio appears paranoid and psychotic.  He has a long history of medication non compliance and malingering.  He is communication is nonsensical and he is paranoid.  Patient did not want provider taking notes and insisted on seeing the paper that was being written on. He was very guarded in his communication.  Plan is to observe him overnight and restart medication on which he was previously stabilized.  Patient would benefit from an LAI if he were amenable.    Patient UDS is positive for cocaine.  During evaluation Aaron Rubio is laying on a stretcher in no acute distress.  He is alert & oriented x to self and location, calm and guarded for this assessment.  His mood is irritable with congruent affect.  He has normal speech, and behavior.  Objectively there is no evidence of psychosis/mania or delusional thinking. Pt does not appear to be responding to internal or external stimuli.  Patient is able to converse coherently, goal directed thoughts, no distractibility, or pre-occupation.  He denies suicidal/self-harm/homicidal ideation; he endorses psychosis, and paranoia.    I personally spent a total of 60 minutes in the care of the patient today including preparing to see the patient, getting/reviewing separately obtained history, performing a medically appropriate exam/evaluation, counseling and educating, placing orders, referring and communicating with other health care professionals, documenting clinical information in the EHR, independently interpreting results, and coordinating care.  Psych ROS:  Depression: endorses Anxiety:  endorses Mania  (lifetime and current): denies Psychosis: (lifetime and current): endorses  Review of Systems  Psychiatric/Behavioral:  Positive for hallucinations and substance abuse.   All other systems reviewed and are negative.    Psychiatric and Social History  Psychiatric History:  Information collected from chart review  Prev Dx/Sx: MDD, polysubstance abuse, schizophrenia and schizoaffective disorder  Current Psych Provider: none Home Meds (current): none Previous Med Trials: risperidone  Therapy: none  Prior Psych Hospitalization: yes  Prior Self Harm: unknown Prior Violence: yes  Family Psych History: unknown Family Hx suicide: unknown  Social History:  Developmental Hx: unknown Educational Hx: unknown Occupational Hx: disability Legal Hx: unknown Living Situation: homeless Spiritual Hx: unknown Access to weapons/lethal means: denies   Substance History Alcohol: Patient wouldn't say  Tobacco: yes Illicit drugs: UDS+ for cocaine Prescription drug abuse: unknown Rehab hx: unknown  Exam Findings  Physical Exam:  Vital Signs:  Temp:  [98.4 F (36.9 C)] 98.4 F (36.9 C) (10/05 0739) Pulse Rate:  [100] 100 (10/05 0739) Resp:  [20] 20 (10/05 0739) BP: (150)/(111) 150/111 (10/05 0739) SpO2:  [100 %] 100 % (10/05 0739) Blood pressure (!) 150/111, pulse 100, temperature 98.4 F (36.9 C), temperature source Oral, resp. rate 20, SpO2 100%. There is no height or weight on file to calculate BMI.  Physical Exam Vitals and nursing note reviewed.  Eyes:     Pupils: Pupils are equal, round, and reactive to light.  Pulmonary:     Effort: Pulmonary effort  is normal.  Skin:    General: Skin is dry.  Neurological:     Mental Status: He is alert. He is disoriented.  Psychiatric:        Attention and Perception: He perceives auditory and visual hallucinations.        Mood and Affect: Mood is anxious.        Speech: Speech normal.        Behavior: Behavior is agitated and actively  hallucinating.        Thought Content: Thought content is paranoid.     Mental Status Exam: General Appearance: Disheveled  Orientation:  Other:  to self and place  Memory:  UTA  Concentration:  Concentration: UTA  Recall:  UTA  Attention  Other: UTA  Eye Contact:  Minimal  Speech:  Clear and Coherent  Language:  Fair but much of his verbalization was nonsensical  Volume:  Normal  Mood: agitated  Affect:  Congruent  Thought Process:  Disorganized  Thought Content:  Hallucinations: Auditory Visual  Suicidal Thoughts:  No  Homicidal Thoughts:  No  Judgement:  Impaired  Insight:  Lacking  Psychomotor Activity:  Normal  Akathisia:  No  Fund of Knowledge:  UTA      Assets:  Leisure Time Physical Health  Cognition:  UTA  ADL's:  Intact  AIMS (if indicated):        Other History   These have been pulled in through the EMR, reviewed, and updated if appropriate.  Family History:  The patient's family history is not on file.  Medical History: Past Medical History:  Diagnosis Date   Hypertension    Schizophrenia [facility])     Surgical History: History reviewed. No pertinent surgical history.   Medications:   Current Facility-Administered Medications:    OLANZapine  zydis (ZYPREXA ) disintegrating tablet 5 mg, 5 mg, Oral, Q8H PRN **AND** LORazepam  (ATIVAN ) tablet 1 mg, 1 mg, Oral, PRN, Goldston, Scott, MD  Current Outpatient Medications:    cyclobenzaprine  (FLEXERIL ) 10 MG tablet, Take 1 tablet (10 mg total) by mouth 2 (two) times daily as needed for muscle spasms., Disp: 20 tablet, Rfl: 0   hydrochlorothiazide  (HYDRODIURIL ) 12.5 MG tablet, Take 1 tablet (12.5 mg total) by mouth daily. (Patient not taking: Reported on 01/05/2024), Disp: 30 tablet, Rfl: 0   ibuprofen  (ADVIL ) 600 MG tablet, Take 1 tablet (600 mg total) by mouth every 6 (six) hours as needed., Disp: 30 tablet, Rfl: 0  Allergies: No Known Allergies  Greer Wainright A Kline, NP

## 2024-04-06 NOTE — ED Notes (Signed)
 Called report to Chief Operating Officer at [facility]

## 2024-04-06 NOTE — ED Notes (Signed)
 Pt sleeping at this time. Rise and fall of chest noted. Pt in NAD at this time. Will continue to monitor.

## 2024-04-06 NOTE — ED Notes (Signed)
 Got pt changed into purple scrub top and bottom. Put pt's belongings in a pt belongings bag with pt sticker on the bag. Pt has 2 pairs of pants, a t shirt, socks, a jacket, and 1 pair of shoes. In the [facility] B cabinet.

## 2024-04-06 NOTE — ED Provider Notes (Signed)
 [facility] Continuous Assessment Admission H&P  Date: 04/06/24 Patient Name: Aaron Rubio MRN: [id number] Chief Complaint: transferred from the [facility] emergency department and was recommended by Kyra Kline, NP., for overnight observation here at the [facility].  Diagnoses:  Final diagnoses:  Paranoid schizophrenia (HCC)  Malingering  Homelessness  Cocaine use disorder, severe, dependence (HCC)    HPI: Aaron Rubio is a 43 year old male patient with a documented history significant for cocaine use, paranoid schizophrenia, MDD, substance-induced mood disorder, homelessness and malingering who was transferred from the [facility] emergency department and was recommended by Efrain Specking, NP., for overnight observation here at the [facility].  Per Efrain Specking, NP., Aaron Rubio is a 43 y.o. male admitted: Presented to the EDfor 04/06/2024  7:27 AM for presented to the ED reporting auditory hallucinations. He carries the psychiatric diagnoses of MDD, polysubstance abuse, schizophrenia and schizoaffective disorder and has a past medical history of  HTN. His current presentation of paranoid psychosis is most consistent with decompensated schizophrenia. He meets criteria for an observation bed based on acute psychosis and the need to restart his medications. Current outpatient psychotropic medications include risperidone  and historically he has had a positive response to these medications. He was not compliant with medications prior to admission as evidenced by patient report that he did not take his medications or follow up after any discharge. On initial examination, patient is paranoid and psychotic. On evaluation Aaron Rubio appears paranoid and psychotic. He has a long history of medication non compliance and malingering. He is communication is nonsensical and he is paranoid. Patient did not want  provider taking notes and insisted on seeing the paper that was being written on. He was very guarded in his communication. Plan is to observe him overnight and restart medication on which he was previously stabilized. Patient would benefit from an LAI if he were amenable. UDS positive for cocaine.   On evaluation here at the [facility], patient tells this provider, I am not telling you my name. I am not talking to you. You are the reason I go jumped in the streets the last time I didn't have anywhere to go. I don't want to talk to you.    Total Time spent with patient: 30 minutes  Musculoskeletal  Strength & Muscle Tone: within normal limits Gait & Station: normal Patient leans: N/A  Psychiatric Specialty Exam  Presentation General Appearance:  Disheveled  Eye Contact: Fair  Speech: Clear and Coherent  Speech Volume: Normal   Physical Exam Cardiovascular:     Rate and Rhythm: Normal rate.  Pulmonary:     Effort: Pulmonary effort is normal.  Neurological:     Mental Status: He is alert.    Review of Systems  Unable to perform ROS: Other (refused)    There were no vitals taken for this visit. There is no height or weight on file to calculate BMI.  Past Psychiatric History: a history of cocaine use, paranoid schizophrenia, MDD, substance-induced mood disorder, homelessness and malingering. Last inpatient psych hospitalization on 12/23/23.  Previous Psychotropic Medications: Yes  Prozac , Seroquel , Zyprexa , Trazodone , Hydroxyzine , Gabapentin    Is the patient at risk to self? No  Has the patient been a risk to self in the past 6 months? No .    Has the patient been a risk to self within the distant past? Yes  Is the patient a risk  to others? No   Has the patient been a risk to others in the past 6 months? No   Has the patient been a risk to others within the distant past? No   Past Medical History: a documented history of HTN   Family  History: No known history reported.   Social History: Homelessness. Unemployed. Cocaine use.   Last Labs:  Admission on 04/06/2024, Discharged on 04/06/2024  Component Date Value Ref Range Status   Sodium 04/06/2024 137  135 - 145 mmol/L Final   Potassium 04/06/2024 4.2  3.5 - 5.1 mmol/L Final   Chloride 04/06/2024 101  98 - 111 mmol/L Final   CO2 04/06/2024 24  22 - 32 mmol/L Final   Glucose, Bld 04/06/2024 70  70 - 99 mg/dL Final   Glucose reference range applies only to samples taken after fasting for at least 8 hours.   BUN 04/06/2024 17  6 - 20 mg/dL Final   Creatinine, Ser 04/06/2024 1.13  0.61 - 1.24 mg/dL Final   Calcium 89/94/7974 9.3  8.9 - 10.3 mg/dL Final   Total Protein 89/94/7974 6.1 (L)  6.5 - 8.1 g/dL Final   Albumin 89/94/7974 4.1  3.5 - 5.0 g/dL Final   AST 89/94/7974 29  15 - 41 U/L Final   ALT 04/06/2024 18  0 - 44 U/L Final   Alkaline Phosphatase 04/06/2024 58  38 - 126 U/L Final   Total Bilirubin 04/06/2024 0.8  0.0 - 1.2 mg/dL Final   GFR, Estimated 04/06/2024 >60  >60 mL/min Final   Comment: (NOTE) Calculated using the CKD-EPI Creatinine Equation (2021)    Anion gap 04/06/2024 13  5 - 15 Final   Performed at [facility], [address] [place], [place] [address]   Opiates 04/06/2024 NEGATIVE  NEGATIVE Final   Cocaine 04/06/2024 POSITIVE (A)  NEGATIVE Final   Benzodiazepines 04/06/2024 NEGATIVE  NEGATIVE Final   Amphetamines 04/06/2024 NEGATIVE  NEGATIVE Final   Tetrahydrocannabinol 04/06/2024 NEGATIVE  NEGATIVE Final   Barbiturates 04/06/2024 NEGATIVE  NEGATIVE Final   Methadone Scn, Ur 04/06/2024 NEGATIVE  NEGATIVE Final   Fentanyl  04/06/2024 NEGATIVE  NEGATIVE Final   Comment: (NOTE) Drug screen is for Medical Purposes only. Positive results are preliminary only. If confirmation is needed, notify lab within 5 days.  Drug Class                 Cutoff (ng/mL) Amphetamine and metabolites 1000 Barbiturate and metabolites  200 Benzodiazepine              200 Opiates and metabolites     300 Cocaine and metabolites     300 THC                         50 Fentanyl                     5 Methadone                   300  Trazodone  is metabolized in vivo to several metabolites,  including pharmacologically active m-CPP, which is excreted in the  urine.  Immunoassay screens for amphetamines and MDMA have potential  cross-reactivity with these compounds and may provide false positive  result.  Performed at [facility], [address], [place], [place] [address]    Salicylate Lvl 04/06/2024 <7.0 (L)  7.0 - 30.0 mg/dL Final   Performed at [facility], [address] [place], [place] [address]   Acetaminophen  (Tylenol ), Serum 04/06/2024 <10 (L)  10 - 30 ug/mL Final   Comment: (NOTE) Toxic concentrations can be more effectively related to post dose interval; > 200, > 100, and > 50 ug/mL serum concentrations correspond to toxic concentrations at 4, 8, and 12 hours post dose, respectively.  Performed at [facility], [address] [place], [place] [address]    WBC 04/06/2024 10.8 (H)  4.0 - 10.5 K/uL Final   RBC 04/06/2024 5.69  4.22 - 5.81 MIL/uL Final   Hemoglobin 04/06/2024 14.3  13.0 - 17.0 g/dL Final   HCT 89/94/7974 46.6  39.0 - 52.0 % Final   MCV 04/06/2024 81.9  80.0 - 100.0 fL Final   MCH 04/06/2024 25.1 (L)  26.0 - 34.0 pg Final   MCHC 04/06/2024 30.7  30.0 - 36.0 g/dL Final   RDW 89/94/7974 13.6  11.5 - 15.5 % Final   Platelets 04/06/2024 272  150 - 400 K/uL Final   nRBC 04/06/2024 0.0  0.0 - 0.2 % Final   Neutrophils Relative % 04/06/2024 68  % Final   Neutro Abs 04/06/2024 7.4  1.7 - 7.7 K/uL Final   Lymphocytes Relative 04/06/2024 20  % Final   Lymphs Abs 04/06/2024 2.2  0.7 - 4.0 K/uL Final   Monocytes Relative 04/06/2024 9  % Final   Monocytes Absolute 04/06/2024 1.0  0.1 - 1.0 K/uL Final   Eosinophils Relative 04/06/2024 1  %  Final   Eosinophils Absolute 04/06/2024 0.1  0.0 - 0.5 K/uL Final   Basophils Relative 04/06/2024 1  % Final   Basophils Absolute 04/06/2024 0.1  0.0 - 0.1 K/uL Final   Immature Granulocytes 04/06/2024 1  % Final   Abs Immature Granulocytes 04/06/2024 0.05  0.00 - 0.07 K/uL Final   Performed at [facility], [address] [place], [place] [address]   Alcohol, Ethyl (B) 04/06/2024 <15  <15 mg/dL Final   Comment: (NOTE) For medical purposes only. Performed at [facility], [address], [place], [place] [address]   Admission on 03/15/2024, Discharged on 03/16/2024  Component Date Value Ref Range Status   Sodium 03/15/2024 139  135 - 145 mmol/L Final   Potassium 03/15/2024 4.0  3.5 - 5.1 mmol/L Final   Chloride 03/15/2024 104  98 - 111 mmol/L Final   CO2 03/15/2024 23  22 - 32 mmol/L Final   Glucose, Bld 03/15/2024 83  70 - 99 mg/dL Final   Glucose reference range applies only to samples taken after fasting for at least 8 hours.   BUN 03/15/2024 14  6 - 20 mg/dL Final   Creatinine, Ser 03/15/2024 1.07  0.61 - 1.24 mg/dL Final   Calcium 90/86/7974 9.5  8.9 - 10.3 mg/dL Final   Total Protein 90/86/7974 6.3 (L)  6.5 - 8.1 g/dL Final   Albumin 90/86/7974 4.1  3.5 - 5.0 g/dL Final   AST 90/86/7974 30  15 - 41 U/L Final   HEMOLYSIS AT THIS LEVEL MAY AFFECT RESULT   ALT 03/15/2024 23  0 - 44 U/L Final   Alkaline Phosphatase 03/15/2024 58  38 - 126 U/L Final   Total Bilirubin 03/15/2024 0.5  0.0 - 1.2 mg/dL Final   GFR, Estimated 03/15/2024 >60  >60 mL/min Final   Comment: (NOTE) Calculated using the CKD-EPI Creatinine Equation (2021)    Anion gap 03/15/2024 12  5 - 15 Final  Performed at [facility], [address] [place], [place] [address]   Alcohol, Ethyl (B) 03/15/2024 <15  <15 mg/dL Final   Comment: (NOTE) For medical purposes only. Performed at [facility], [address], [place], [place]  [address]    Opiates 03/15/2024 NEGATIVE  NEGATIVE Final   Cocaine 03/15/2024 POSITIVE (A)  NEGATIVE Final   Benzodiazepines 03/15/2024 NEGATIVE  NEGATIVE Final   Amphetamines 03/15/2024 NEGATIVE  NEGATIVE Final   Tetrahydrocannabinol 03/15/2024 POSITIVE (A)  NEGATIVE Final   Barbiturates 03/15/2024 NEGATIVE  NEGATIVE Final   Methadone Scn, Ur 03/15/2024 NEGATIVE  NEGATIVE Final   Fentanyl  03/15/2024 NEGATIVE  NEGATIVE Final   Comment: (NOTE) Drug screen is for Medical Purposes only. Positive results are preliminary only. If confirmation is needed, notify lab within 5 days.  Drug Class                 Cutoff (ng/mL) Amphetamine and metabolites 1000 Barbiturate and metabolites 200 Benzodiazepine              200 Opiates and metabolites     300 Cocaine and metabolites     300 THC                         50 Fentanyl                     5 Methadone                   300  Trazodone  is metabolized in vivo to several metabolites,  including pharmacologically active m-CPP, which is excreted in the  urine.  Immunoassay screens for amphetamines and MDMA have potential  cross-reactivity with these compounds and may provide false positive  result.  Performed at [facility], [address] [place], [place] [address]    WBC 03/15/2024 7.4  4.0 - 10.5 K/uL Final   RBC 03/15/2024 5.15  4.22 - 5.81 MIL/uL Final   Hemoglobin 03/15/2024 13.2  13.0 - 17.0 g/dL Final   HCT 90/86/7974 43.9  39.0 - 52.0 % Final   MCV 03/15/2024 85.2  80.0 - 100.0 fL Final   MCH 03/15/2024 25.6 (L)  26.0 - 34.0 pg Final   MCHC 03/15/2024 30.1  30.0 - 36.0 g/dL Final   RDW 90/86/7974 13.8  11.5 - 15.5 % Final   Platelets 03/15/2024 228  150 - 400 K/uL Final   nRBC 03/15/2024 0.0  0.0 - 0.2 % Final   Performed at [facility], [address] [place], [place] [address]  Admission on 03/14/2024, Discharged on 03/15/2024  Component Date Value Ref Range Status   Sodium 03/15/2024  141  135 - 145 mmol/L Final   Potassium 03/15/2024 4.1  3.5 - 5.1 mmol/L Final   Chloride 03/15/2024 104  98 - 111 mmol/L Final   BUN 03/15/2024 12  6 - 20 mg/dL Final   Creatinine, Ser 03/15/2024 1.10  0.61 - 1.24 mg/dL Final   Glucose, Bld 90/86/7974 93  70 - 99 mg/dL Final   Glucose reference range applies only to samples taken after fasting for at least 8 hours.   Calcium, Ion 03/15/2024 1.19  1.15 - 1.40 mmol/L Final   TCO2 03/15/2024 26  22 - 32 mmol/L Final   Hemoglobin 03/15/2024 13.9  13.0 - 17.0 g/dL Final   HCT 90/86/7974 41.0  39.0 - 52.0 % Final  Admission on 03/06/2024, Discharged  on 03/07/2024  Component Date Value Ref Range Status   Sodium 03/06/2024 140  135 - 145 mmol/L Final   Potassium 03/06/2024 3.7  3.5 - 5.1 mmol/L Final   Chloride 03/06/2024 104  98 - 111 mmol/L Final   CO2 03/06/2024 28  22 - 32 mmol/L Final   Glucose, Bld 03/06/2024 99  70 - 99 mg/dL Final   Glucose reference range applies only to samples taken after fasting for at least 8 hours.   BUN 03/06/2024 9  6 - 20 mg/dL Final   Creatinine, Ser 03/06/2024 1.02  0.61 - 1.24 mg/dL Final   Calcium 90/95/7974 9.0  8.9 - 10.3 mg/dL Final   Total Protein 90/95/7974 5.9 (L)  6.5 - 8.1 g/dL Final   Albumin 90/95/7974 3.5  3.5 - 5.0 g/dL Final   AST 90/95/7974 28  15 - 41 U/L Final   ALT 03/06/2024 19  0 - 44 U/L Final   Alkaline Phosphatase 03/06/2024 57  38 - 126 U/L Final   Total Bilirubin 03/06/2024 0.4  0.0 - 1.2 mg/dL Final   GFR, Estimated 03/06/2024 >60  >60 mL/min Final   Comment: (NOTE) Calculated using the CKD-EPI Creatinine Equation (2021)    Anion gap 03/06/2024 8  5 - 15 Final   Performed at [facility], [address] [place], [place] [address]   Alcohol, Ethyl (B) 03/06/2024 <15  <15 mg/dL Final   Comment: (NOTE) For medical purposes only. Performed at [facility], [address], [place], [place] [address]    WBC 03/06/2024 7.2  4.0 - 10.5 K/uL Final   RBC 03/06/2024  5.41  4.22 - 5.81 MIL/uL Final   Hemoglobin 03/06/2024 14.1  13.0 - 17.0 g/dL Final   HCT 90/95/7974 45.6  39.0 - 52.0 % Final   MCV 03/06/2024 84.3  80.0 - 100.0 fL Final   MCH 03/06/2024 26.1  26.0 - 34.0 pg Final   MCHC 03/06/2024 30.9  30.0 - 36.0 g/dL Final   RDW 90/95/7974 13.9  11.5 - 15.5 % Final   Platelets 03/06/2024 284  150 - 400 K/uL Final   nRBC 03/06/2024 0.0  0.0 - 0.2 % Final   Performed at [facility], [address], [place], [place] [address]  Admission on 02/02/2024, Discharged on 02/03/2024  Component Date Value Ref Range Status   Sodium 02/02/2024 140  135 - 145 mmol/L Final   Potassium 02/02/2024 3.7  3.5 - 5.1 mmol/L Final   Chloride 02/02/2024 108  98 - 111 mmol/L Final   CO2 02/02/2024 23  22 - 32 mmol/L Final   Glucose, Bld 02/02/2024 131 (H)  70 - 99 mg/dL Final   Glucose reference range applies only to samples taken after fasting for at least 8 hours.   BUN 02/02/2024 18  6 - 20 mg/dL Final   Creatinine, Ser 02/02/2024 1.37 (H)  0.61 - 1.24 mg/dL Final   Calcium 91/97/7974 8.8 (L)  8.9 - 10.3 mg/dL Final   Total Protein 91/97/7974 6.1 (L)  6.5 - 8.1 g/dL Final   Albumin 91/97/7974 3.5  3.5 - 5.0 g/dL Final   AST 91/97/7974 38  15 - 41 U/L Final   ALT 02/02/2024 28  0 - 44 U/L Final   Alkaline Phosphatase 02/02/2024 83  38 - 126 U/L Final   Total Bilirubin 02/02/2024 0.6  0.0 - 1.2 mg/dL Final   GFR, Estimated 02/02/2024 >60  >60 mL/min Final   Comment: (  NOTE) Calculated using the CKD-EPI Creatinine Equation (2021)    Anion gap 02/02/2024 9  5 - 15 Final   Performed at [facility], [address], [place], [place] [address]   Alcohol, Ethyl (B) 02/02/2024 <15  <15 mg/dL Final   Comment: (NOTE) For medical purposes only. Performed at [facility], [address], [place], [place] [address]    WBC 02/02/2024 6.8  4.0 - 10.5 K/uL Final   RBC 02/02/2024 4.64  4.22 - 5.81 MIL/uL Final   Hemoglobin  02/02/2024 12.3 (L)  13.0 - 17.0 g/dL Final   HCT 91/97/7974 39.1  39.0 - 52.0 % Final   MCV 02/02/2024 84.3  80.0 - 100.0 fL Final   MCH 02/02/2024 26.5  26.0 - 34.0 pg Final   MCHC 02/02/2024 31.5  30.0 - 36.0 g/dL Final   RDW 91/97/7974 14.1  11.5 - 15.5 % Final   Platelets 02/02/2024 230  150 - 400 K/uL Final   nRBC 02/02/2024 0.0  0.0 - 0.2 % Final   Performed at [facility], [address], [place], [place] [address]   Opiates 02/03/2024 NONE DETECTED  NONE DETECTED Final   Cocaine 02/03/2024 POSITIVE (A)  NONE DETECTED Final   Benzodiazepines 02/03/2024 NONE DETECTED  NONE DETECTED Final   Amphetamines 02/03/2024 NONE DETECTED  NONE DETECTED Final   Tetrahydrocannabinol 02/03/2024 POSITIVE (A)  NONE DETECTED Final   Barbiturates 02/03/2024 NONE DETECTED  NONE DETECTED Final   Comment: (NOTE) DRUG SCREEN FOR MEDICAL PURPOSES ONLY.  IF CONFIRMATION IS NEEDED FOR ANY PURPOSE, NOTIFY LAB WITHIN 5 DAYS.  LOWEST DETECTABLE LIMITS FOR URINE DRUG SCREEN Drug Class                     Cutoff (ng/mL) Amphetamine and metabolites    1000 Barbiturate and metabolites    200 Benzodiazepine                 200 Opiates and metabolites        300 Cocaine and metabolites        300 THC                            50 Performed at [facility], [address] [place], [place] [address]   Admission on 01/14/2024, Discharged on 01/17/2024  Component Date Value Ref Range Status   Sodium 01/14/2024 134 (L)  135 - 145 mmol/L Final   Potassium 01/14/2024 3.1 (L)  3.5 - 5.1 mmol/L Final   Chloride 01/14/2024 99  98 - 111 mmol/L Final   CO2 01/14/2024 25  22 - 32 mmol/L Final   Glucose, Bld 01/14/2024 169 (H)  70 - 99 mg/dL Final   Glucose reference range applies only to samples taken after fasting for at least 8 hours.   BUN 01/14/2024 13  6 - 20 mg/dL Final   Creatinine, Ser 01/14/2024 1.39 (H)  0.61 - 1.24 mg/dL Final   Calcium 92/85/7974 9.2  8.9 - 10.3  mg/dL Final   Total Protein 92/85/7974 7.0  6.5 - 8.1 g/dL Final   Albumin 92/85/7974 3.9  3.5 - 5.0 g/dL Final   AST 92/85/7974 52 (H)  15 - 41 U/L Final   ALT 01/14/2024 36  0 - 44 U/L Final   Alkaline Phosphatase 01/14/2024 66  38 - 126 U/L Final   Total Bilirubin 01/14/2024 0.8  0.0 -  1.2 mg/dL Final   GFR, Estimated 01/14/2024 >60  >60 mL/min Final   Comment: (NOTE) Calculated using the CKD-EPI Creatinine Equation (2021)    Anion gap 01/14/2024 10  5 - 15 Final   Performed at [facility], [address], [place], [place] [address]   Alcohol, Ethyl (B) 01/14/2024 <15  <15 mg/dL Final   Comment: (NOTE) For medical purposes only. Performed at [facility], [address], [place], [place] [address]    Opiates 01/14/2024 NONE DETECTED  NONE DETECTED Final   Cocaine 01/14/2024 POSITIVE (A)  NONE DETECTED Final   Benzodiazepines 01/14/2024 NONE DETECTED  NONE DETECTED Final   Amphetamines 01/14/2024 NONE DETECTED  NONE DETECTED Final   Tetrahydrocannabinol 01/14/2024 NONE DETECTED  NONE DETECTED Final   Barbiturates 01/14/2024 NONE DETECTED  NONE DETECTED Final   Comment: (NOTE) DRUG SCREEN FOR MEDICAL PURPOSES ONLY.  IF CONFIRMATION IS NEEDED FOR ANY PURPOSE, NOTIFY LAB WITHIN 5 DAYS.  LOWEST DETECTABLE LIMITS FOR URINE DRUG SCREEN Drug Class                     Cutoff (ng/mL) Amphetamine and metabolites    1000 Barbiturate and metabolites    200 Benzodiazepine                 200 Opiates and metabolites        300 Cocaine and metabolites        300 THC                            50 Performed at [facility], [address] [place], [place] [address]    WBC 01/14/2024 9.2  4.0 - 10.5 K/uL Final   RBC 01/14/2024 5.18  4.22 - 5.81 MIL/uL Final   Hemoglobin 01/14/2024 13.7  13.0 - 17.0 g/dL Final   HCT 92/85/7974 43.4  39.0 - 52.0 % Final   MCV 01/14/2024 83.8  80.0 - 100.0 fL Final   MCH 01/14/2024 26.4  26.0 -  34.0 pg Final   MCHC 01/14/2024 31.6  30.0 - 36.0 g/dL Final   RDW 92/85/7974 13.5  11.5 - 15.5 % Final   Platelets 01/14/2024 274  150 - 400 K/uL Final   nRBC 01/14/2024 0.0  0.0 - 0.2 % Final   Neutrophils Relative % 01/14/2024 56  % Final   Neutro Abs 01/14/2024 5.1  1.7 - 7.7 K/uL Final   Lymphocytes Relative 01/14/2024 24  % Final   Lymphs Abs 01/14/2024 2.2  0.7 - 4.0 K/uL Final   Monocytes Relative 01/14/2024 9  % Final   Monocytes Absolute 01/14/2024 0.8  0.1 - 1.0 K/uL Final   Eosinophils Relative 01/14/2024 10  % Final   Eosinophils Absolute 01/14/2024 1.0 (H)  0.0 - 0.5 K/uL Final   Basophils Relative 01/14/2024 1  % Final   Basophils Absolute 01/14/2024 0.1  0.0 - 0.1 K/uL Final   Immature Granulocytes 01/14/2024 0  % Final   Abs Immature Granulocytes 01/14/2024 0.03  0.00 - 0.07 K/uL Final   Performed at [facility], [address] [place], [place] [address]   Acetaminophen  (Tylenol ), Serum 01/14/2024 12  10 - 30 ug/mL Final   Comment: (NOTE) Therapeutic concentrations vary significantly. A range of 10-30 ug/mL  may be an effective concentration for many patients. However, some  are best treated at concentrations outside of this range. Acetaminophen  concentrations >  150 ug/mL at 4 hours after ingestion  and >50 ug/mL at 12 hours after ingestion are often associated with  toxic reactions.  Performed at [facility], [address], [place], [place] [address]    Salicylate Lvl 01/14/2024 <7.0 (L)  7.0 - 30.0 mg/dL Final   Performed at [facility], [address], [place], [place] [address]  Admission on 01/10/2024, Discharged on 01/11/2024  Component Date Value Ref Range Status   Sodium 01/10/2024 134 (L)  135 - 145 mmol/L Final   Potassium 01/10/2024 3.6  3.5 - 5.1 mmol/L Final   Chloride 01/10/2024 101  98 - 111 mmol/L Final   CO2 01/10/2024 22  22 - 32 mmol/L Final   Glucose, Bld 01/10/2024 103 (H)  70 - 99  mg/dL Final   Glucose reference range applies only to samples taken after fasting for at least 8 hours.   BUN 01/10/2024 20  6 - 20 mg/dL Final   Creatinine, Ser 01/10/2024 1.27 (H)  0.61 - 1.24 mg/dL Final   Calcium 92/89/7974 9.1  8.9 - 10.3 mg/dL Final   Total Protein 92/89/7974 6.6  6.5 - 8.1 g/dL Final   Albumin 92/89/7974 4.0  3.5 - 5.0 g/dL Final   AST 92/89/7974 84 (H)  15 - 41 U/L Final   ALT 01/10/2024 43  0 - 44 U/L Final   Alkaline Phosphatase 01/10/2024 79  38 - 126 U/L Final   Total Bilirubin 01/10/2024 0.6  0.0 - 1.2 mg/dL Final   GFR, Estimated 01/10/2024 >60  >60 mL/min Final   Comment: (NOTE) Calculated using the CKD-EPI Creatinine Equation (2021)    Anion gap 01/10/2024 11  5 - 15 Final   Performed at [facility], [address] [place], [place] [address]   Alcohol, Ethyl (B) 01/10/2024 <15  <15 mg/dL Final   Comment: (NOTE) For medical purposes only. Performed at [facility], [address] [place], [place] [address]    WBC 01/10/2024 9.4  4.0 - 10.5 K/uL Final   RBC 01/10/2024 5.06  4.22 - 5.81 MIL/uL Final   Hemoglobin 01/10/2024 13.3  13.0 - 17.0 g/dL Final   HCT 92/89/7974 42.4  39.0 - 52.0 % Final   MCV 01/10/2024 83.8  80.0 - 100.0 fL Final   MCH 01/10/2024 26.3  26.0 - 34.0 pg Final   MCHC 01/10/2024 31.4  30.0 - 36.0 g/dL Final   RDW 92/89/7974 13.2  11.5 - 15.5 % Final   Platelets 01/10/2024 280  150 - 400 K/uL Final   nRBC 01/10/2024 0.0  0.0 - 0.2 % Final   Performed at [facility], [address] [place], [place] [address]   Opiates 01/10/2024 NONE DETECTED  NONE DETECTED Final   Cocaine 01/10/2024 POSITIVE (A)  NONE DETECTED Final   Benzodiazepines 01/10/2024 NONE DETECTED  NONE DETECTED Final   Amphetamines 01/10/2024 NONE DETECTED  NONE DETECTED Final   Tetrahydrocannabinol 01/10/2024 POSITIVE (A)  NONE DETECTED Final   Barbiturates 01/10/2024 NONE DETECTED  NONE DETECTED Final   Comment: (NOTE) DRUG SCREEN FOR  MEDICAL PURPOSES ONLY.  IF CONFIRMATION IS NEEDED FOR ANY PURPOSE, NOTIFY LAB WITHIN 5 DAYS.  LOWEST DETECTABLE LIMITS FOR URINE DRUG SCREEN Drug Class                     Cutoff (ng/mL) Amphetamine and metabolites    1000 Barbiturate and metabolites    200 Benzodiazepine  200 Opiates and metabolites        300 Cocaine and metabolites        300 THC                            50 Performed at [facility], [address], [place], [place] [address]   Admission on 01/09/2024, Discharged on 01/10/2024  Component Date Value Ref Range Status   Sodium 01/09/2024 134 (L)  135 - 145 mmol/L Final   Potassium 01/09/2024 3.2 (L)  3.5 - 5.1 mmol/L Final   Chloride 01/09/2024 97 (L)  98 - 111 mmol/L Final   CO2 01/09/2024 25  22 - 32 mmol/L Final   Glucose, Bld 01/09/2024 110 (H)  70 - 99 mg/dL Final   Glucose reference range applies only to samples taken after fasting for at least 8 hours.   BUN 01/09/2024 24 (H)  6 - 20 mg/dL Final   Creatinine, Ser 01/09/2024 1.38 (H)  0.61 - 1.24 mg/dL Final   Calcium 92/90/7974 9.4  8.9 - 10.3 mg/dL Final   Total Protein 92/90/7974 7.8  6.5 - 8.1 g/dL Final   Albumin 92/90/7974 4.6  3.5 - 5.0 g/dL Final   AST 92/90/7974 122 (H)  15 - 41 U/L Final   ALT 01/09/2024 49 (H)  0 - 44 U/L Final   Alkaline Phosphatase 01/09/2024 65  38 - 126 U/L Final   Total Bilirubin 01/09/2024 1.1  0.0 - 1.2 mg/dL Final   GFR, Estimated 01/09/2024 >60  >60 mL/min Final   Comment: (NOTE) Calculated using the CKD-EPI Creatinine Equation (2021)    Anion gap 01/09/2024 12  5 - 15 Final   Performed at [facility], [address] [place], [place] [address]   Alcohol, Ethyl (B) 01/09/2024 <15  <15 mg/dL Final   Comment: (NOTE) For medical purposes only. Performed at [facility], [address], [place], [place] [address]    WBC 01/09/2024 12.7 (H)  4.0 - 10.5 K/uL Final   RBC 01/09/2024 5.26  4.22 - 5.81 MIL/uL  Final   Hemoglobin 01/09/2024 13.9  13.0 - 17.0 g/dL Final   HCT 92/90/7974 42.9  39.0 - 52.0 % Final   MCV 01/09/2024 81.6  80.0 - 100.0 fL Final   MCH 01/09/2024 26.4  26.0 - 34.0 pg Final   MCHC 01/09/2024 32.4  30.0 - 36.0 g/dL Final   RDW 92/90/7974 13.2  11.5 - 15.5 % Final   Platelets 01/09/2024 278  150 - 400 K/uL Final   nRBC 01/09/2024 0.0  0.0 - 0.2 % Final   Performed at [facility], [address] [place], [place] [address]  Admission on 01/05/2024, Discharged on 01/07/2024  Component Date Value Ref Range Status   Sodium 01/05/2024 136  135 - 145 mmol/L Final   Potassium 01/05/2024 3.5  3.5 - 5.1 mmol/L Final   Chloride 01/05/2024 100  98 - 111 mmol/L Final   CO2 01/05/2024 21 (L)  22 - 32 mmol/L Final   Glucose, Bld 01/05/2024 118 (H)  70 - 99 mg/dL Final   Glucose reference range applies only to samples taken after fasting for at least 8 hours.   BUN 01/05/2024 28 (H)  6 - 20 mg/dL Final   Creatinine, Ser 01/05/2024 1.28 (H)  0.61 - 1.24 mg/dL Final   Calcium 92/94/7974 9.9  8.9 - 10.3 mg/dL Final   Total  Protein 01/05/2024 8.0  6.5 - 8.1 g/dL Final   Albumin 92/94/7974 4.6  3.5 - 5.0 g/dL Final   AST 92/94/7974 68 (H)  15 - 41 U/L Final   ALT 01/05/2024 35  0 - 44 U/L Final   Alkaline Phosphatase 01/05/2024 66  38 - 126 U/L Final   Total Bilirubin 01/05/2024 1.3 (H)  0.0 - 1.2 mg/dL Final   GFR, Estimated 01/05/2024 >60  >60 mL/min Final   Comment: (NOTE) Calculated using the CKD-EPI Creatinine Equation (2021)    Anion gap 01/05/2024 15  5 - 15 Final   Performed at [facility], [address] [place], [place] [address]   Alcohol, Ethyl (B) 01/05/2024 <15  <15 mg/dL Final   Comment: (NOTE) For medical purposes only. Performed at [facility], [address], [place], [place] [address]    WBC 01/05/2024 14.5 (H)  4.0 - 10.5 K/uL Final   RBC 01/05/2024 5.46  4.22 - 5.81 MIL/uL Final   Hemoglobin  01/05/2024 14.2  13.0 - 17.0 g/dL Final   HCT 92/94/7974 44.9  39.0 - 52.0 % Final   MCV 01/05/2024 82.2  80.0 - 100.0 fL Final   MCH 01/05/2024 26.0  26.0 - 34.0 pg Final   MCHC 01/05/2024 31.6  30.0 - 36.0 g/dL Final   RDW 92/94/7974 13.5  11.5 - 15.5 % Final   Platelets 01/05/2024 291  150 - 400 K/uL Final   nRBC 01/05/2024 0.0  0.0 - 0.2 % Final   Performed at [facility], [address], [place], [place] [address]   Opiates 01/05/2024 NONE DETECTED  NONE DETECTED Final   Cocaine 01/05/2024 POSITIVE (A)  NONE DETECTED Final   Benzodiazepines 01/05/2024 NONE DETECTED  NONE DETECTED Final   Amphetamines 01/05/2024 NONE DETECTED  NONE DETECTED Final   Tetrahydrocannabinol 01/05/2024 NONE DETECTED  NONE DETECTED Final   Barbiturates 01/05/2024 NONE DETECTED  NONE DETECTED Final   Comment: (NOTE) DRUG SCREEN FOR MEDICAL PURPOSES ONLY.  IF CONFIRMATION IS NEEDED FOR ANY PURPOSE, NOTIFY LAB WITHIN 5 DAYS.  LOWEST DETECTABLE LIMITS FOR URINE DRUG SCREEN Drug Class                     Cutoff (ng/mL) Amphetamine and metabolites    1000 Barbiturate and metabolites    200 Benzodiazepine                 200 Opiates and metabolites        300 Cocaine and metabolites        300 THC                            50 Performed at [facility], [address] [place], [place] [address]   Admission on 12/24/2023, Discharged on 01/02/2024  Component Date Value Ref Range Status   Vit D, 25-Hydroxy 12/25/2023 28.71 (L)  30 - 100 ng/mL Final   Comment: (NOTE) Vitamin D  deficiency has been defined by the Institute of Medicine  and an Endocrine Society practice guideline as a level of serum 25-OH  vitamin D  less than 20 ng/mL (1,2). The Endocrine Society went on to  further define vitamin D  insufficiency as a level between 21 and 29  ng/mL (2).  1. IOM (Institute of Medicine). 2010. Dietary reference intakes for  calcium and D. [place]: [employer]. 2. Holick MF, Binkley  , Bischoff-Ferrari HA, et al. Evaluation,  treatment, and prevention of vitamin D  deficiency: an Endocrine  Society clinical practice guideline, JCEM. 2011 Jul; 96(7): 1911-30.  Performed at [facility], [address], [place], [place] [address]   There may be more visits with results that are not included.    Allergies: Patient has no known allergies.  Medications:  Facility Ordered Medications  Medication   acetaminophen  (TYLENOL ) tablet 650 mg   alum & mag hydroxide-simeth (MAALOX/MYLANTA) 200-200-20 MG/5ML suspension 30 mL   magnesium  hydroxide (MILK OF MAGNESIA) suspension 30 mL   haloperidol  (HALDOL ) tablet 5 mg   And   diphenhydrAMINE  (BENADRYL ) capsule 50 mg   haloperidol  lactate (HALDOL ) injection 5 mg   And   diphenhydrAMINE  (BENADRYL ) injection 50 mg   And   LORazepam  (ATIVAN ) injection 2 mg   haloperidol  lactate (HALDOL ) injection 10 mg   And   diphenhydrAMINE  (BENADRYL ) injection 50 mg   And   LORazepam  (ATIVAN ) injection 2 mg   hydrOXYzine  (ATARAX ) tablet 25 mg   traZODone  (DESYREL ) tablet 50 mg   risperiDONE  (RISPERDAL ) tablet 1 mg   risperiDONE  (RISPERDAL  M-TABS) disintegrating tablet 1 mg   acetaminophen  (TYLENOL ) tablet 650 mg   alum & mag hydroxide-simeth (MAALOX/MYLANTA) 200-200-20 MG/5ML suspension 30 mL   magnesium  hydroxide (MILK OF MAGNESIA) suspension 30 mL   haloperidol  (HALDOL ) tablet 5 mg   And   diphenhydrAMINE  (BENADRYL ) capsule 50 mg   haloperidol  lactate (HALDOL ) injection 5 mg   And   diphenhydrAMINE  (BENADRYL ) injection 50 mg   And   LORazepam  (ATIVAN ) injection 2 mg   haloperidol  lactate (HALDOL ) injection 10 mg   And   diphenhydrAMINE  (BENADRYL ) injection 50 mg   And   LORazepam  (ATIVAN ) injection 2 mg   traZODone  (DESYREL ) tablet 100 mg   PTA Medications  Medication Sig   hydrochlorothiazide  (HYDRODIURIL ) 12.5 MG tablet Take 1 tablet (12.5 mg total) by mouth daily. (Patient  not taking: Reported on 01/05/2024)   cyclobenzaprine  (FLEXERIL ) 10 MG tablet Take 1 tablet (10 mg total) by mouth 2 (two) times daily as needed for muscle spasms.   ibuprofen  (ADVIL ) 600 MG tablet Take 1 tablet (600 mg total) by mouth every 6 (six) hours as needed.      Medical Decision Making  Patient was transferred from the [facility] emergency department to the [facility] for overnight observation to restart medications for paranoia. Patient has a significant history of paranoid schizophrenia, malingering due to homelessness and noncompliance with outpatient psychiatry. He also has a significant history of cocaine use and UDS positive for cocaine which may be a precipitating factor to reported paranoia. Patient was restarted on Risperdal  1 mg po BID in the [facility]. Will restart Risperdal  1 mg po BID. Patient medically cleared at [facility].    Recommendations  Based on my evaluation the patient does not appear to have an emergency medical condition.  Teresa Wyline CROME, NP 04/06/24  1:44 PM

## 2024-04-06 NOTE — ED Triage Notes (Signed)
 Pt arriving POV reporting that he is hearing voices, denies SI/HI. When asking patient questions, his answers are very erratic. Unable to have productive conversation.

## 2024-04-06 NOTE — ED Notes (Signed)
 Pt given lunch tray, a ham sandwich, ginger ale, and cranberry juice; per pt's request. 1 spoon and 1 fork on tray.

## 2024-04-06 NOTE — ED Notes (Signed)
 Patient admitted to [facility] from [facility] due to auditory hallucinations secondary to med non compliance.  Patient calm and cooperative with admission process.  He is talking to self and responding to internal stimuli.  Patient asking writer about different types of cakes.  Patient malodorous in scrubs.  He is a direct admit as all of his labs were completed in the ED.  Patient now on unit on recliner.

## 2024-04-06 NOTE — ED Provider Notes (Signed)
 [place] EMERGENCY DEPARTMENT AT [facility] Provider Note   CSN: [id number] Arrival date & time: 04/06/24  9279     Patient presents with: Psychiatric Evaluation   Aaron Rubio is a 43 y.o. male.   HPI 43 year old male with a history of schizophrenia presents with psychiatric complaints.  Unfortunately due to his current mental state, it is difficult to get a clear history.  Patient does not seem to answer most of the questions asked of him but talks often about his father and about other seemingly not unconnected parts.  He seems to deny any acute pain.  It is hard to get a straight answer if he is suicidal or homicidal.  Prior to Admission medications   Medication Sig Start Date End Date Taking? Authorizing Provider  cyclobenzaprine  (FLEXERIL ) 10 MG tablet Take 1 tablet (10 mg total) by mouth 2 (two) times daily as needed for muscle spasms. 04/02/24   Nivia Colon, PA-C  hydrochlorothiazide  (HYDRODIURIL ) 12.5 MG tablet Take 1 tablet (12.5 mg total) by mouth daily. Patient not taking: Reported on 01/05/2024 01/03/24   Blair Robin H, NP  ibuprofen  (ADVIL ) 600 MG tablet Take 1 tablet (600 mg total) by mouth every 6 (six) hours as needed. 04/02/24   Nivia Colon, PA-C    Allergies: Patient has no known allergies.    Review of Systems  Unable to perform ROS: Psychiatric disorder    Updated Vital Signs BP (!) 147/92   Pulse 100   Temp 98.2 F (36.8 C)   Resp 18   SpO2 100%   Physical Exam Vitals and nursing note reviewed.  Constitutional:      General: He is not in acute distress.    Appearance: He is well-developed. He is not ill-appearing or diaphoretic.  HENT:     Head: Normocephalic and atraumatic.  Cardiovascular:     Rate and Rhythm: Normal rate and regular rhythm.     Heart sounds: Normal heart sounds.  Pulmonary:     Effort: Pulmonary effort is normal.     Breath sounds: Normal breath sounds.  Abdominal:     General: There is no distension.   Skin:    General: Skin is warm and dry.  Neurological:     Mental Status: He is alert.  Psychiatric:        Mood and Affect: Affect is not angry.        Speech: Speech is tangential.     Comments: Patient seems to be talking to himself.  When I do end up talking to him, his answers are tangential.  No pressured speech or agitation.     (all labs ordered are listed, but only abnormal results are displayed) Labs Reviewed  COMPREHENSIVE METABOLIC PANEL WITH GFR - Abnormal; Notable for the following components:      Result Value   Total Protein 6.1 (*)    All other components within normal limits  URINE DRUG SCREEN - Abnormal; Notable for the following components:   Cocaine POSITIVE (*)    All other components within normal limits  SALICYLATE LEVEL - Abnormal; Notable for the following components:   Salicylate Lvl <7.0 (*)    All other components within normal limits  ACETAMINOPHEN  LEVEL - Abnormal; Notable for the following components:   Acetaminophen  (Tylenol ), Serum <10 (*)    All other components within normal limits  CBC WITH DIFFERENTIAL/PLATELET - Abnormal; Notable for the following components:   WBC 10.8 (*)    MCH 25.1 (*)  All other components within normal limits  ETHANOL    EKG: EKG Interpretation Date/Time:  Sunday April 06 2024 09:28:06 EDT Ventricular Rate:  79 PR Interval:  158 QRS Duration:  102 QT Interval:  412 QTC Calculation: 472 R Axis:   -20  Text Interpretation: Sinus rhythm with marked sinus arrhythmia with Fusion complexes Nonspecific T wave abnormality Prolonged QT Abnormal ECG similar to July 2025 Confirmed by Freddi Hamilton [phone]) on 04/06/2024 10:15:31 AM  Radiology: No results found.   Procedures   Medications Ordered in the ED - No data to display                                   Medical Decision Making Amount and/or Complexity of Data Reviewed External Data Reviewed: notes. Labs: ordered.    Details: Mild WBC  elevation ECG/medicine tests: ordered and independent interpretation performed.    Details: Sinus rhythm  Risk Prescription drug management.   Patient presents with some decompensated schizophrenia.  Probably from poor medication compliance.  He is not agitated at this time.  Given some Zyprexa .  TTS consulted and psychiatry is recommending transfer to [facility].  No obvious medical complaints at this time.     Final diagnoses:  Schizophrenia, unspecified type [facility])    ED Discharge Orders     None          Freddi Hamilton, MD 04/06/24 [phone]

## 2024-04-06 NOTE — ED Notes (Signed)
 Security was contacted to wand pt.

## 2024-04-07 ENCOUNTER — Other Ambulatory Visit: Payer: Self-pay

## 2024-04-07 ENCOUNTER — Inpatient Hospital Stay (HOSPITAL_COMMUNITY)
Admission: AD | Admit: 2024-04-07 | Discharge: 2024-04-11 | DRG: 885 | Disposition: A | Discharge status: Home / Self Care | Source: Intra-hospital | Attending: Psychiatry | Admitting: Psychiatry

## 2024-04-07 ENCOUNTER — Other Ambulatory Visit (HOSPITAL_COMMUNITY): Payer: Self-pay

## 2024-04-07 ENCOUNTER — Encounter (HOSPITAL_COMMUNITY): Payer: Self-pay

## 2024-04-07 DIAGNOSIS — R45851 Suicidal ideations: Secondary | ICD-10-CM | POA: Diagnosis present

## 2024-04-07 DIAGNOSIS — I10 Essential (primary) hypertension: Secondary | ICD-10-CM | POA: Diagnosis present

## 2024-04-07 DIAGNOSIS — F1420 Cocaine dependence, uncomplicated: Secondary | ICD-10-CM | POA: Diagnosis present

## 2024-04-07 DIAGNOSIS — F259 Schizoaffective disorder, unspecified: Secondary | ICD-10-CM | POA: Diagnosis present

## 2024-04-07 DIAGNOSIS — Z5941 Food insecurity: Secondary | ICD-10-CM | POA: Diagnosis not present

## 2024-04-07 DIAGNOSIS — Z653 Problems related to other legal circumstances: Secondary | ICD-10-CM | POA: Diagnosis not present

## 2024-04-07 DIAGNOSIS — G4700 Insomnia, unspecified: Secondary | ICD-10-CM | POA: Diagnosis present

## 2024-04-07 DIAGNOSIS — F17210 Nicotine dependence, cigarettes, uncomplicated: Secondary | ICD-10-CM | POA: Diagnosis present

## 2024-04-07 DIAGNOSIS — Z5948 Other specified lack of adequate food: Secondary | ICD-10-CM | POA: Diagnosis not present

## 2024-04-07 DIAGNOSIS — F250 Schizoaffective disorder, bipolar type: Secondary | ICD-10-CM | POA: Diagnosis present

## 2024-04-07 DIAGNOSIS — Z91148 Patient's other noncompliance with medication regimen for other reason: Secondary | ICD-10-CM | POA: Diagnosis not present

## 2024-04-07 DIAGNOSIS — Z59869 Financial insecurity, unspecified: Secondary | ICD-10-CM | POA: Diagnosis not present

## 2024-04-07 DIAGNOSIS — Z5900 Homelessness unspecified: Secondary | ICD-10-CM | POA: Diagnosis not present

## 2024-04-07 DIAGNOSIS — F329 Major depressive disorder, single episode, unspecified: Secondary | ICD-10-CM | POA: Diagnosis present

## 2024-04-07 DIAGNOSIS — F419 Anxiety disorder, unspecified: Secondary | ICD-10-CM | POA: Diagnosis present

## 2024-04-07 DIAGNOSIS — F1410 Cocaine abuse, uncomplicated: Secondary | ICD-10-CM | POA: Diagnosis present

## 2024-04-07 DIAGNOSIS — F200 Paranoid schizophrenia: Secondary | ICD-10-CM

## 2024-04-07 DIAGNOSIS — Z5982 Transportation insecurity: Secondary | ICD-10-CM

## 2024-04-07 MED ORDER — TRAZODONE HCL 50 MG PO TABS
50.0000 mg | ORAL_TABLET | Freq: Every evening | ORAL | Status: DC | PRN
  Administered 2024-04-09: 50 mg via ORAL
  Filled 2024-04-07 (×4): qty 1

## 2024-04-07 MED ORDER — LORAZEPAM 2 MG/ML IJ SOLN: 2.0000 mg | Freq: Three times a day (TID) | INTRAMUSCULAR | Status: DC | PRN

## 2024-04-07 MED ORDER — HYDROXYZINE HCL 10 MG PO TABS
10.0000 mg | ORAL_TABLET | Freq: Three times a day (TID) | ORAL | Status: DC | PRN
  Administered 2024-04-08: 10 mg via ORAL
  Filled 2024-04-07: qty 1

## 2024-04-07 MED ORDER — ALUM & MAG HYDROXIDE-SIMETH 200-200-20 MG/5ML PO SUSP: 30.0000 mL | ORAL | Status: DC | PRN

## 2024-04-07 MED ORDER — MAGNESIUM HYDROXIDE 400 MG/5ML PO SUSP: 30.0000 mL | Freq: Every day | ORAL | Status: DC | PRN

## 2024-04-07 MED ORDER — ACETAMINOPHEN 325 MG PO TABS
650.0000 mg | ORAL_TABLET | Freq: Four times a day (QID) | ORAL | Status: DC | PRN
  Administered 2024-04-08 – 2024-04-09 (×2): 650 mg via ORAL
  Filled 2024-04-07 (×3): qty 2

## 2024-04-07 MED ORDER — HALOPERIDOL 5 MG PO TABS: 5.0000 mg | ORAL_TABLET | Freq: Three times a day (TID) | ORAL | Status: DC | PRN

## 2024-04-07 MED ORDER — DIPHENHYDRAMINE HCL 50 MG/ML IJ SOLN: 50.0000 mg | Freq: Three times a day (TID) | INTRAMUSCULAR | Status: DC | PRN

## 2024-04-07 MED ORDER — HALOPERIDOL LACTATE 5 MG/ML IJ SOLN: 5.0000 mg | Freq: Three times a day (TID) | INTRAMUSCULAR | Status: DC | PRN

## 2024-04-07 MED ORDER — RISPERIDONE 1 MG PO TABS
1.0000 mg | ORAL_TABLET | Freq: Two times a day (BID) | ORAL | Status: DC
  Administered 2024-04-07 – 2024-04-11 (×8): 1 mg via ORAL
  Filled 2024-04-07 (×8): qty 1

## 2024-04-07 MED ORDER — DIPHENHYDRAMINE HCL 25 MG PO CAPS: 50.0000 mg | ORAL_CAPSULE | Freq: Three times a day (TID) | ORAL | Status: DC | PRN

## 2024-04-07 NOTE — Plan of Care (Signed)

## 2024-04-07 NOTE — [facility] Counselor (Signed)
 Adult Comprehensive Assessment  Patient ID: Aaron Rubio, male   DOB: Oct 01, 1980, 43 y.o.   MRN: [id number]  1ST ATTEMPT  CSW attempted to complete PSA, patient stated I'm trying to fight off some bad voices right now, can you come back?    Louetta Lame. 04/07/2024

## 2024-04-07 NOTE — ED Notes (Signed)
 Patient transferred to [facility] per MD order. Patient discharged in no acute distress, A& O x4 and ambulatory. Patient denied SI/HI, A/VH upon discharge. Patient verbalized understanding of admission. Patient mood fair. Patient belongings returned to patient from locker #16 complete and intact. Patient escorted to lobby via staff for transport to destination. Safety maintained.

## 2024-04-07 NOTE — Progress Notes (Signed)
 Patient admitted to unit, A&O x4. Patient states that he came in because of increase depression, SI and AVH worsen by arguments with his family and not able to afford his medications. Pt currently denies HI and AVH, pt endorses passive SI but contracts for safety. Patient oriented to unit, reviewed unit rules and schedule, pt verbalized understanding. Lunch given to patient, q15 min checks in place, pt denies any other needs at this time.

## 2024-04-07 NOTE — Progress Notes (Signed)
   04/07/24 1330  Psych Admission Type (Psych Patients Only)  Admission Status Voluntary  Psychosocial Assessment  Patient Complaints Self-harm behaviors;Depression;Sadness  Eye Contact Brief  Facial Expression Flat  Affect Anxious  Speech Logical/coherent  Interaction Assertive  Motor Activity Slow  Appearance/Hygiene In scrubs  Behavior Characteristics Cooperative;Appropriate to situation  Mood Anxious  Thought Process  Coherency Circumstantial  Content WDL  Delusions Paranoid  Perception Derealization  Hallucination None reported or observed  Judgment Poor  Confusion None  Danger to Self  Current suicidal ideation? Denies  Agreement Not to Harm Self Yes  Description of Agreement verbal  Danger to Others  Danger to Others None reported or observed
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
## 2024-04-07 NOTE — Discharge Instructions (Signed)
 Base on the information you have provided and the presenting issue, outpatient services and resources for have been recommended.  It is imperative that you follow through with treatment recommendations within 5-7 days from the of discharge to mitigate further risk to your safety and mental well-being. A list of referrals has been provided below to get you started.  You are not limited to the list provided.  In case of an urgent crisis, you may contact the Mobile Crisis Unit with [facility] at [phone].    [facility] [address], [place], [address] [phone] phone   New Patient Assessment/Therapy Walk-Ins:  Monday and Wednesday: 8 am until slots are full. Every 1st and 2nd Fridays of the month: 1 pm - 5 pm.  NO ASSESSMENT/THERAPY WALK-INS ON TUESDAYS OR THURSDAYS  New Patient Assessment/Medication Management Walk-Ins:  Monday - Friday:  8 am - 11 am.  For all walk-ins, we ask that you arrive by 7:00 am because patients will be seen in the order of arrival.  Availability is limited; therefore, you may not be seen on the same day that you walk-in.  Our goal is to serve and meet the needs of our community to the best of our ability.

## 2024-04-07 NOTE — Progress Notes (Signed)
 Pt has been accepted to [facility] on 04/07/2024 Bed assignment: 400-01  Pt meets inpatient criteria per: Wyline Pizza NP  Attending Physician will be: Leita Arts MD  Report can be called to: unit:-Adult unit: [phone]  Pt can arrive after [facility] WILL UPDATE  Care Team Notified: [facility] Cherylynn Ernst RN, [place] Ward RN  [place] Eain Mullendore LCSW-A   04/07/2024 11:35 AM

## 2024-04-07 NOTE — ED Notes (Signed)
 Patient resting in lounger with eyes closed, respirations even and unlabored. Patient in no apparent acute distress. Environment secured. Safety checks in place per facility protocol.

## 2024-04-07 NOTE — Group Note (Signed)
 Date:  04/07/2024 Time:  3:08 PM  Group Topic/Focus:  Emotional Education:   The focus of this group is to discuss what feelings/emotions are, and how they are experienced. Physical Wellness Education: To educate participants on the principles of physical wellness, promote healthy lifestyle habits, and encourage regular physical activity to improve overall physical health, energy levels, and quality of life.   Participation Level:  Did Not Attend   Kristianne Albin R Penelopi Mikrut 04/07/2024, 3:08 PM

## 2024-04-07 NOTE — Tx Team (Signed)
 Initial Treatment Plan 04/07/2024 1:52 PM Aaron Rubio FMW:[id number]    PATIENT STRESSORS: Marital or family conflict   Medication change or noncompliance     PATIENT STRENGTHS: Motivation for treatment/growth  Physical Health    PATIENT IDENTIFIED PROBLEMS: Depression                      DISCHARGE CRITERIA:  Need for constant or close observation no longer present Verbal commitment to aftercare and medication compliance  PRELIMINARY DISCHARGE PLAN: Attend aftercare/continuing care group Return to previous living arrangement  PATIENT/FAMILY INVOLVEMENT: This treatment plan has been presented to and reviewed with the patient, Aaron Rubio. The patient has been given the opportunity to ask questions and make suggestions.  Huel Mall, RN 04/07/2024, 1:52 PM

## 2024-04-07 NOTE — ED Notes (Signed)
 Patient alert & oriented x4. Denies intent to harm self or others when asked. Denies A/VH. Patient denies any physical complaints when asked. No acute distress noted. Support and encouragement provided. Patient observed in milieu. Scheduled medications administered with no complications. Patient asked if he was still set to discharge today, writer informed him that was what I had gotten in hand off but that I had not heard from the provider regarding his discharge plan today. Patient voiced understanding. No inappropriate behaviors observed or reported. Routine safety checks conducted per facility protocol. Encouraged patient to notify staff if any thoughts of harm towards self or others arise. Patient verbalizes understanding and agreement.

## 2024-04-07 NOTE — Plan of Care (Signed)
   Problem: Education: Goal: Knowledge of [place] [facility] Education information/materials will improve Outcome: Progressing Goal: Emotional status will improve Outcome: Progressing Goal: Mental status will improve Outcome: Progressing

## 2024-04-07 NOTE — [facility] Group Notes (Signed)
 [facility] Group Notes:  (Nursing/MHT/Case Management/Adjunct)  Date:  04/07/2024  Time:  8:39 PM  Type of Therapy:  Wrap-up group  Participation Level:  Did Not Attend  Participation Quality:    Affect:    Cognitive:    Insight:    Engagement in Group:    Modes of Intervention:    Summary of Progress/Problems: PT refused to attend group.  Aaron Rubio 04/07/2024, 8:39 PM

## 2024-04-08 MED ORDER — HYDROXYZINE HCL 25 MG PO TABS
25.0000 mg | ORAL_TABLET | Freq: Three times a day (TID) | ORAL | Status: DC | PRN
  Administered 2024-04-09 – 2024-04-11 (×2): 25 mg via ORAL
  Filled 2024-04-08 (×4): qty 1

## 2024-04-08 NOTE — Plan of Care (Signed)
   Problem: Education: Goal: Emotional status will improve Outcome: Progressing Goal: Mental status will improve Outcome: Progressing
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
## 2024-04-08 NOTE — Group Note (Signed)
 Date:  04/08/2024 Time:  1:43 PM  Group Topic/Focus:  Recovery Goals:   The focus of this group is to identify appropriate goals for recovery and establish a plan to achieve them. [employer]: To understand individualized and supportive care plans that promote mental health recovery, enhance coping skills, and improve overall well-being, while fostering a safe and inclusive environment for all participants.   Participation Level:  Did Not Attend   Aaron Rubio 04/08/2024, 1:43 PM

## 2024-04-08 NOTE — [facility] Suicide Risk Assessment (Signed)
 Suicide Risk Assessment  Admission Assessment    [facility] Admission Suicide Risk Assessment   Nursing information obtained from:  Patient Demographic factors:  Male, Unemployed Current Mental Status:  Suicidal ideation indicated by patient Loss Factors:  Financial problems / change in socioeconomic status Historical Factors:  Impulsivity Risk Reduction Factors:  Living with another person, especially a relative  Total Time spent with patient: 45 minutes Principal Problem: Schizoaffective disorder (HCC) Diagnosis:  Principal Problem:   Schizoaffective disorder (HCC)  Subjective Data: See H&P   Continued Clinical Symptoms:  Alcohol Use Disorder Identification Test Final Score (AUDIT): 0 The Alcohol Use Disorders Identification Test, Guidelines for Use in Primary Care, Second Edition.  World Science writer [facility]). Score between 0-7:  no or low risk or alcohol related problems. Score between 8-15:  moderate risk of alcohol related problems. Score between 16-19:  high risk of alcohol related problems. Score 20 or above:  warrants further diagnostic evaluation for alcohol dependence and treatment.   CLINICAL FACTORS:   Depression:   Anhedonia Hopelessness Impulsivity Severe Alcohol/Substance Abuse/Dependencies Schizophrenia:   Paranoid or undifferentiated type More than one psychiatric diagnosis Previous Psychiatric Diagnoses and Treatments   Musculoskeletal: Strength & Muscle Tone: within normal limits Gait & Station: normal Patient leans: N/A  Psychiatric Specialty Exam:  Presentation  General Appearance:  Disheveled  Eye Contact: Fair  Speech: Clear and Coherent; Normal Rate  Speech Volume: Normal  Handedness: Right   Mood and Affect  Mood: Dysphoric  Affect: Congruent   Thought Process  Thought Processes: Coherent; Goal Directed  Descriptions of Associations:Intact  Orientation:Full (Time, Place and Person)  Thought  Content:Logical  History of Schizophrenia/Schizoaffective disorder:Yes  Duration of Psychotic Symptoms:Greater than six months  Hallucinations:Hallucinations: Auditory; Visual  Ideas of Reference:Paranoia  Suicidal Thoughts:Suicidal Thoughts: Yes, Passive SI Passive Intent and/or Plan: Without Intent; Without Plan; Without Means to Carry Out; Without Access to Means  Homicidal Thoughts:Homicidal Thoughts: No   Sensorium  Memory: Immediate Good; Recent Fair  Judgment: Poor  Insight: Lacking   Executive Functions  Concentration: Fair  Attention Span: Fair  Recall: [employer] of Knowledge: Fair  Language: Fair   Psychomotor Activity  Psychomotor Activity: Psychomotor Activity: Normal   Assets  Assets: Desire for Improvement; Resilience   Sleep  Sleep: Sleep: Poor    Physical Exam: Physical Exam ROS Blood pressure 110/82, pulse 91, temperature 98.5 F (36.9 C), temperature source Oral, resp. rate 18, height 6' 2 (1.88 m), weight 97.1 kg, SpO2 99%. Body mass index is 27.48 kg/m.   COGNITIVE FEATURES THAT CONTRIBUTE TO RISK:  None    SUICIDE RISK:   Moderate:  Frequent suicidal ideation with limited intensity, and duration, some specificity in terms of plans, no associated intent, good self-control, limited dysphoria/symptomatology, some risk factors present, and identifiable protective factors, including available and accessible social support.  PLAN OF CARE: See H&P   I certify that inpatient services furnished can reasonably be expected to improve the patient's condition.   Blair Chiquita Hint, NP 04/08/2024, 1:21 PM

## 2024-04-08 NOTE — H&P (Signed)
 Psychiatric Admission Assessment Adult  Patient Identification: Aaron Rubio MRN:  [id number] Date of Evaluation:  04/08/2024 Chief Complaint:  Schizoaffective disorder [facility]) [F25.9] Principal Diagnosis: Schizoaffective disorder (HCC) Diagnosis:  Principal Problem:   Schizoaffective disorder (HCC)  History of Present Illness: Aaron Rubio is a 43 year old male with a history of major depressive disorder, polysubstance abuse, schizoaffective disorder, and malingering. He presented to [facility] with auditory hallucinations consistent with decompensated schizophrenia and was transferred to the [facility] for overnight observation, where his psychotropic medications were restarted. He continued to endorse psychotic symptoms and was subsequently admitted to the [facility]. Per chart review he has had multiple prior psychiatric admissions at [facility] in April, May, June, and September 2025. His medical history includes hypertension.   Chart reviewed.  Patient seen face-to-face by this provider.  On evaluation today, the patient reports experiencing "a whole lot of bad situations" recently. He endorses both auditory and visual hallucinations as well as paranoia. He describes feeling that people are watching him and want to harm him. He reports visual hallucinations of seeing fast-moving shadows and auditory hallucinations of loud, derogatory voices, which are non-command in nature. He states these symptoms have been present for the past couple of weeks. The patient endorses passive suicidal ideation without intent or plan and denies homicidal ideation, previous suicide attempts, or access to firearms.  He reports multiple prior inpatient psychiatric hospitalizations and identifies his current stressors as homelessness, financial instability, and lack of companionship. He admits noncompliance with prescribed medications, including  risperidone . He endorses worsening depressive symptoms characterized by anhedonia, poor sleep, and low energy. He denies currently seeing a psychiatrist or therapist and denies any history of self-harm behaviors. He denies prior substance use rehabilitation. He expresses interest in a therapy referral after discharge but is not interested in substance use treatment.   He denies a history of violent behavior but discloses a past incarceration for 90 days related to an assault on an officer at [facility]. He reports being on probation until April 17, 2024, and has an upcoming court date on October 28 for misdemeanor larceny.  Objectively, the patient is alert and oriented to person, place, time, and situation during the evaluation. He appeared cooperative and engaged throughout the interview. His speech was clear, coherent, and goal-directed. He did not appear to be internally preoccupied or responding to internal stimuli at the time of assessment. No acute behavioral disturbances were observed.  UDS positive for cocaine.   Associated Signs/Symptoms: Depression Symptoms:  depressed mood, anhedonia, insomnia, feelings of worthlessness/guilt, difficulty concentrating, hopelessness, suicidal thoughts with specific plan, loss of energy/fatigue, disturbed sleep, (Hypo) Manic Symptoms:  Hallucinations, Impulsivity, Anxiety Symptoms:  Excessive Worry, Psychotic Symptoms:  Hallucinations: Auditory Visual Paranoia, PTSD Symptoms: Denies history of trauma  Total Time spent with patient: 1.5 hours  Past Psychiatric History: Information collected from patient and chart:  Previous Psych Diagnoses: Major depressive disorder, schizophrenia, schizoaffective disorder, polysubstance abuse, and malingering. Prior inpatient treatment: Multiple prior psychiatric hospitalizations at [facility] in April, May, June, and September 2025.  Current/prior outpatient treatment: Denies  Prior rehab tx:  Denies Psychotherapy tx: Denies History of suicide: Endorses passive suicidal ideation without intent or plan; denies prior suicide attempts. History of homicide: Denies homicidal ideation or history of violent behavior.  Psychiatric medication history: Prozac , Seroquel , Zyprexa , Trazodone , Hydroxyzine , Risperidone , and Gabapentin . Psychiatric medication compliance history: Reports noncompliance with current and past psychiatric medications.  Neuromodulation history: None reported.  Current Psychiatrist: None. Current therapist: None.  Substance Abuse Hx: Alcohol: Denies current alcohol use. Tobacco: Denies tobacco use. Illicit drugs: Cocaine use Rx drug abuse: Denies prescription drug misuse. Rehab: Denies prior rehabilitation for substance use.  Past Medical History: Medical Diagnoses: Hypertension. Home Rx: None  Head trauma, LOC, concussions, seizures: Denies  Allergies: Denies medication and food allergies. LMP: Not applicable (male). Contraception: Not applicable. PCP: None currently.  Family Psych History: Diagnoses: Denies family history of psychiatric illness. SA/HA: Denies family history of suicide attempts or completions. Substance use family hx: Reports father had a history of heroin use.   Is the patient at risk to self? Yes.    Has the patient been a risk to self in the past 6 months? Yes.    Has the patient been a risk to self within the distant past? Yes.    Is the patient a risk to others? No.  Has the patient been a risk to others in the past 6 months? No.  Has the patient been a risk to others within the distant past? Yes.     [place] Scale:  Flowsheet Row [facility] Most recent reading at 04/07/2024  1:30 PM ED from 04/06/2024 in [facility] Most recent reading at 04/06/2024  1:45 PM ED from 04/06/2024 in [facility] Emergency Department at [facility] Most recent reading at 04/06/2024  7:41 AM  C-SSRS RISK CATEGORY No Risk No Risk No Risk     Prior Inpatient Therapy: Yes.   [facility] in April, May, June, and Sept. 2025. Prior Outpatient Therapy: Yes.   Does not follow-up after discharge.   Alcohol Screening: 1. How often do you have a drink containing alcohol?: Never 2. How many drinks containing alcohol do you have on a typical day when you are drinking?: 1 or 2 3. How often do you have six or more drinks on one occasion?: Never AUDIT-C Score: 0 4. How often during the last year have you found that you were not able to stop drinking once you had started?: Never 5. How often during the last year have you failed to do what was normally expected from you because of drinking?: Never 6. How often during the last year have you needed a first drink in the morning to get yourself going after a heavy drinking session?: Never 7. How often during the last year have you had a feeling of guilt of remorse after drinking?: Never 8. How often during the last year have you been unable to remember what happened the night before because you had been drinking?: Never 9. Have you or someone else been injured as a result of your drinking?: No 10. Has a relative or friend or a doctor or another health worker been concerned about your drinking or suggested you cut down?: No Alcohol Use Disorder Identification Test Final Score (AUDIT): 0 Alcohol Brief Interventions/Follow-up: Alcohol education/Brief advice Substance Abuse History in the last 12 months:  Yes.   Consequences of Substance Abuse: Negative Previous Psychotropic Medications: Yes  Psychological Evaluations: Yes  Past Medical History:  Past Medical History:  Diagnosis Date   Hypertension    Schizophrenia (HCC)    History reviewed. No pertinent surgical history. Family History: History reviewed. No pertinent family history. Family Psychiatric  History: Reports father had a history of heroin  use. Tobacco Screening:  Social History   Tobacco Use  Smoking Status Every Day  Current packs/day: 0.30   Types: Cigarettes  Smokeless Tobacco Not on file    BH Tobacco Counseling     Are you interested in Tobacco Cessation Medications?  No, patient refused Counseled patient on smoking cessation:  Refused/Declined practical counseling Reason Tobacco Screening Not Completed: Patient Refused Screening       Social History:  Social History   Substance and Sexual Activity  Alcohol Use Yes   Comment: occassionally     Social History   Substance and Sexual Activity  Drug Use Yes   Types: Cocaine, Marijuana   Comment: Daily; last three years    Additional Social History: Marital status: Single Are you sexually active?: No What is your sexual orientation?: Heterosexual Has your sexual activity been affected by drugs, alcohol, medication, or emotional stress?: No Does patient have children?: Yes How many children?: 2 How is patient's relationship with their children?: UTA - I don't know     Childhood: Denies adverse or traumatic childhood experiences. Abuse: Denies emotional, physical, or sexual abuse. Marital Status: Single. Sexual orientation: Heterosexual. Children: Two sons, ages 54 and 70. Employment: Works intermittently at [employer].  Education: Completed 12th grade. Peer Group: Limited social supports; identifies paternal cousins as local contacts. Housing: Currently unhoused. Finances: Receives Medicaid and EBT benefits.  Legal: Incarcerated for 90 days due to assault on an officer at [facility]; currently on probation until April 17, 2024; upcoming court date on October 28 for misdemeanor larceny.  Military: Denies military history.                     Allergies:  No Known Allergies Lab Results: No results found for this or any previous visit (from the past 48 hours).  Blood Alcohol level:  Lab Results  Component Value Date    [facility] <15 04/06/2024   ETH <15 03/15/2024    Metabolic Disorder Labs:  Lab Results  Component Value Date   HGBA1C 5.0 10/27/2023   MPG 96.8 10/27/2023   No results found for: PROLACTIN Lab Results  Component Value Date   CHOL 152 10/19/2023   TRIG 144 10/19/2023   HDL 57 10/19/2023   CHOLHDL 2.7 10/19/2023   VLDL 29 10/19/2023   LDLCALC 66 10/19/2023    Current Medications: Current Facility-Administered Medications  Medication Dose Route Frequency Provider Last Rate Last Admin   acetaminophen  (TYLENOL ) tablet 650 mg  650 mg Oral Q6H PRN Lenard Calin, MD   650 mg at 04/08/24 0630   alum & mag hydroxide-simeth (MAALOX/MYLANTA) 200-200-20 MG/5ML suspension 30 mL  30 mL Oral Q4H PRN Lenard Calin, MD       haloperidol  (HALDOL ) tablet 5 mg  5 mg Oral TID PRN Lenard Calin, MD       And   diphenhydrAMINE  (BENADRYL ) capsule 50 mg  50 mg Oral TID PRN Lenard Calin, MD       haloperidol  lactate (HALDOL ) injection 5 mg  5 mg Intramuscular TID PRN Lenard Calin, MD       And   diphenhydrAMINE  (BENADRYL ) injection 50 mg  50 mg Intramuscular TID PRN Lenard Calin, MD       And   LORazepam  (ATIVAN ) injection 2 mg  2 mg Intramuscular TID PRN Lenard Calin, MD       hydrOXYzine  (ATARAX ) tablet 10 mg  10 mg Oral TID PRN Lenard Calin, MD   10 mg at 04/08/24 0630   magnesium  hydroxide (MILK OF MAGNESIA) suspension 30 mL  30 mL Oral  Daily PRN Lenard Calin, MD       risperiDONE  (RISPERDAL ) tablet 1 mg  1 mg Oral BID Lenard Calin, MD   1 mg at 04/08/24 9053   traZODone  (DESYREL ) tablet 50 mg  50 mg Oral QHS PRN Lenard Calin, MD       PTA Medications: Medications Prior to Admission  Medication Sig Dispense Refill Last Dose/Taking   cyclobenzaprine  (FLEXERIL ) 10 MG tablet Take 1 tablet (10 mg total) by mouth 2 (two) times daily as needed for muscle spasms. 20 tablet 0    hydrochlorothiazide  (HYDRODIURIL ) 12.5 MG tablet Take 1 tablet (12.5 mg total) by mouth daily.  (Patient not taking: Reported on 01/05/2024) 30 tablet 0     AIMS:  ,  ,  ,  ,  ,  ,    Musculoskeletal: Strength & Muscle Tone: within normal limits Gait & Station: normal Patient leans: N/A            Psychiatric Specialty Exam:  Presentation  General Appearance:  Disheveled  Eye Contact: Fair  Speech: Clear and Coherent; Normal Rate  Speech Volume: Normal  Handedness: Right   Mood and Affect  Mood: Dysphoric  Affect: Congruent   Thought Process  Thought Processes: Coherent; Goal Directed  Duration of Psychotic Symptoms:Greater than 6 months Past Diagnosis of Schizophrenia or Psychoactive disorder: Yes  Descriptions of Associations:Intact  Orientation:Full (Time, Place and Person)  Thought Content:Logical  Hallucinations:Hallucinations: Auditory; Visual  Ideas of Reference:Paranoia  Suicidal Thoughts:Suicidal Thoughts: Yes, Passive SI Passive Intent and/or Plan: Without Intent; Without Plan; Without Means to Carry Out; Without Access to Means  Homicidal Thoughts:Homicidal Thoughts: No   Sensorium  Memory: Immediate Good; Recent Fair  Judgment: Poor  Insight: Lacking   Executive Functions  Concentration: Fair  Attention Span: Fair  Recall: [employer] of Knowledge: Fair  Language: Fair   Psychomotor Activity  Psychomotor Activity: Psychomotor Activity: Normal   Assets  Assets: Desire for Improvement; Resilience   Sleep  Sleep: Sleep: Poor  Estimated Sleeping Duration (Last 24 Hours): 10.00-12.25 hours   Physical Exam: Physical Exam Vitals and nursing note reviewed.  Constitutional:      General: He is not in acute distress.    Appearance: He is not ill-appearing.  HENT:     Mouth/Throat:     Pharynx: Oropharynx is clear.  Cardiovascular:     Rate and Rhythm: Normal rate.     Pulses: Normal pulses.  Pulmonary:     Effort: No respiratory distress.  Skin:    General: Skin is dry.   Neurological:     Mental Status: He is alert and oriented to person, place, and time.    Review of Systems  Constitutional: Negative.   HENT: Negative.    Respiratory: Negative.    Cardiovascular: Negative.   Gastrointestinal: Negative.   Genitourinary: Negative.   Musculoskeletal: Negative.   Skin: Negative.   Neurological: Negative.   Psychiatric/Behavioral:  Positive for depression, hallucinations, substance abuse and suicidal ideas (Passive SI). Negative for memory loss. The patient is nervous/anxious and has insomnia.    Blood pressure 110/82, pulse 91, temperature 98.5 F (36.9 C), temperature source Oral, resp. rate 18, height 6' 2 (1.88 m), weight 97.1 kg, SpO2 99%. Body mass index is 27.48 kg/m.  Treatment Plan Summary: Daily contact with patient to assess and evaluate symptoms and progress in treatment and Medication management   ASSESSMENT: 43 year old male with a history of major depressive disorder, schizophrenia, schizoaffective disorder, polysubstance abuse, and malingering.  His medical history is significant for hypertension. He has had multiple prior psychiatric admissions at [facility] in April, May, June, and September 2025. He currently presents with auditory and visual hallucinations, paranoia, and worsening depressive symptoms.   Diagnoses / Active Problems:          Principal Problem:   Schizoaffective disorder (HCC)        PLAN: Safety and Monitoring:             -- Voluntary admission to inpatient psychiatric unit for safety, stabilization and treatment             -- Daily contact with patient to assess and evaluate symptoms and progress in treatment             -- Patient's case to be discussed in multi-disciplinary team meeting             -- Observation Level: q15 minute checks             -- Vital signs:  q12 hours             -- Precautions: suicide, elopement, and assault   2. Psychiatric Diagnoses and Treatment:    # Schizoaffective disorder    -- Continue Risperidone  1 mg 2 times daily   -- Hydroxyzine  25 mg oral, 3 times daily as needed, anxiety -- Trazodone  50 mg, oral, daily at bedtime as needed, sleep             -- Haldol  BH Agitation Protocol (See MAR)                   3. Medical Issues Being Addressed:   N/A         4. Labs    -- CBC: WBC 7.8, MCH 25.1, otherwise WNL             -- CMP: Total Protein 6.1, otherwise WNL             -- Ethanol: <15             -- Lipid Panel (03/17/24): Triglycerides 234, HDL 40, otherwise WNL             -- HgBA1c (03/17/2024): WNL             -- UDS: + Cocaine             -- EKG: QT/QTc 412/472    -- The risks/benefits/side-effects/alternatives to this medication were discussed in detail with the patient and time was given for questions. The patient consents to medication trial.  -- FDA -- Metabolic profile and EKG monitoring obtained while on an atypical antipsychotic (BMI: Lipid Panel: HbgA1c: QTc:)               -- Encouraged patient to participate in unit milieu and in scheduled group therapies  -- Short Term Goals: Ability to identify changes in lifestyle to reduce recurrence of condition will improve, Ability to verbalize feelings will improve, Ability to disclose and discuss suicidal ideas, Ability to demonstrate self-control will improve, Ability to identify and develop effective coping behaviors will improve, Ability to maintain clinical measurements within normal limits will improve, Compliance with prescribed medications will improve, and Ability to identify triggers associated with substance abuse/mental health issues will improve             -- Long Term Goals: Improvement in symptoms so as ready for discharge     5. Discharge Planning:  -- Social work and  case management to assist with discharge planning and identification of hospital follow-up needs prior to discharge -- Estimated LOS: 5-7 days -- Discharge Concerns: Need to establish a safety plan; Medication  compliance and effectiveness -- Discharge Goals: Return home with outpatient referrals for mental health follow-up including medication management/psychotherapy     Physician Treatment Plan for Primary Diagnosis: Schizoaffective disorder (HCC) Long Term Goal(s): Improvement in symptoms so as ready for discharge  Short Term Goals: Ability to identify changes in lifestyle to reduce recurrence of condition will improve, Ability to verbalize feelings will improve, Ability to disclose and discuss suicidal ideas, Ability to demonstrate self-control will improve, Ability to identify and develop effective coping behaviors will improve, Ability to maintain clinical measurements within normal limits will improve, Compliance with prescribed medications will improve, and Ability to identify triggers associated with substance abuse/mental health issues will improve   I certify that inpatient services furnished can reasonably be expected to improve the patient's condition.    Blair Chiquita Hint, NP 10/7/20251:23 PM

## 2024-04-08 NOTE — Plan of Care (Signed)
   Problem: Education: Goal: Knowledge of [place] [facility] Education information/materials will improve Outcome: Progressing Goal: Emotional status will improve Outcome: Progressing Goal: Mental status will improve Outcome: Progressing

## 2024-04-08 NOTE — Group Note (Signed)
 Date:  04/08/2024 Time:  9:58 AM  Group Topic/Focus:  Goals Group:   The focus of this group is to help patients establish daily goals to achieve during treatment and discuss how the patient can incorporate goal setting into their daily lives to aide in recovery.    Participation Level:  Did Not Attend   Aaron Rubio 04/08/2024, 9:58 AM

## 2024-04-08 NOTE — Group Note (Signed)
 Recreation Therapy Group Note   Group Topic:Animal Assisted Therapy   Group Date: 04/08/2024 Start Time: 9050 End Time: 1030 Facilitators: Justan Gaede-McCall, LRT,CTRS Location: [address] Dayroom   Animal-Assisted Activity (AAA) Program Checklist/Progress Notes Patient Eligibility Criteria Checklist & Daily Group note for Rec Tx Intervention  AAA/T Program Assumption of Risk Form signed by Patient/ or Parent Legal Guardian Yes  Patient understands his/her participation is voluntary Yes  Behavioral Response:    Education: Charity fundraiser, Appropriate Animal Interaction   Education Outcome: Acknowledges education.    Affect/Mood: N/A   Participation Level: Did not attend    Clinical Observations/Individualized Feedback:      Plan: Continue to engage patient in RT group sessions 2-3x/week.   Kaylynn Chamblin-McCall, LRT,CTRS  04/08/2024 12:38 PM

## 2024-04-08 NOTE — Group Note (Signed)
 LCSW Group Therapy Note   Group Date: 04/08/2024 Start Time: 1100 End Time: 1200   Participation:  did not attend  Type of Therapy:  Group Therapy  Topic:  Stress Less:  Nurturing Your Mind and Body Through Calm    Objective:  Learn techniques for managing stress through body relaxation, mindfulness, and self-compassion.  Goals: Use body relaxation techniques, such as Box Breathing and Progressive Muscle Relaxation, to reduce physical tension. Practice mindfulness to break the cycle of overthinking and mental chatter. Embrace self-compassion to handle stress with kindness and resilience.  Summary:  Today's session focused on calming the body with relaxation techniques, breaking the cycle of stress with mindfulness, and using self-compassion to manage challenges more gracefully. These tools help reduce stress and foster a balanced, peaceful mindset.  Therapeutic Modalities used:  Elements of CBT ( cognitive restructuring)  Elements of DBT (box breathing, progressive body relaxation, mindfulness, acceptance)    Kirsty Monjaraz O Jniya Madara, LCSWA 04/08/2024  4:55 PM

## 2024-04-08 NOTE — Progress Notes (Addendum)
(  Sleep Hours) -11.75 (Any PRNs that were needed, meds refused, or side effects to meds)- none (Any disturbances and when (visitation, over night)-none (Concerns raised by the patient)- none (SI/HI/AVH)- denies all  BP reported high this am per MHT with monitor. Retaken manual at 110/82

## 2024-04-08 NOTE — Group Note (Signed)
 Date:  04/08/2024 Time:  5:27 PM  Group Topic/Focus:  Dimensions of Wellness:   The focus of this group is to introduce the topic of wellness and discuss the role each dimension of wellness plays in total health.    Participation Level:  Did Not Attend   Aaron Rubio 04/08/2024, 5:27 PM

## 2024-04-08 NOTE — Progress Notes (Signed)
 Tour of Duty:  Prentice JINNY Angle, RN, 04/08/24, Tour of Duty: 0700-1900  SI/HI/AVH: Denies, suspected thought blocking  Self-Reported   Mood: Neutral  Anxiety: Denies Depression: Denies Irritability: Denies  Broset  Violence Prevention Guidelines *See Row Information*: Small Violence Risk interventions implemented   LBM  Last BM Date : 04/07/24   Pain: not present  Patient Refusals (including Rx): No  >>Shift Summary: Patient observed to be calm on unit. Patient able to make needs known. Patient suspected to be thought blocking or minimizing symptoms. Patient guarded. Patient observed to engage appropriately with staff and peers, but minimal initiation. Patient taking medications as prescribed. This shift, no PRN medication requested or required. No reported or observed side effects to medication. No reported or observed agitation, aggression, or other acute emotional distress. No reported or observed physical abnormalities or concerns.  Last Vitals  Vitals Weight: 97.1 kg Temp: 98.5 F (36.9 C) Temp Source: Oral Pulse Rate: 75 Resp: 18 BP: (!) 143/103 Patient Position: (not recorded)  Admission Type  Psych Admission Type (Psych Patients Only) Admission Status: Voluntary Date 72 hour document signed : (not recorded) Time 72 hour document signed : (not recorded) Provider Notified (First and Last Name) (see details for LINK to note): (not recorded)   Psychosocial Assessment  Psychosocial Assessment Patient Complaints: None Eye Contact: Poor Facial Expression: Anxious Affect: Anxious Speech: Soft Interaction: Guarded, Minimal Motor Activity: Slow Appearance/Hygiene: Unremarkable Behavior Characteristics: Cooperative, Guarded Mood: Anxious   Aggressive Behavior  Targets: (not recorded)   Thought Process  Thought Process Coherency: Blocking Content: Within Defined Limits Delusions: None reported or observed Perception: Within Defined  Limits Hallucination: None reported or observed Judgment: Poor Confusion: None  Danger to Self/Others  Danger to Self Current suicidal ideation?: Denies Description of Suicide Plan: (not recorded) Self-Injurious Behavior: (not recorded) Agreement Not to Harm Self: (not recorded) Description of Agreement: (not recorded) Danger to Others: None reported or observed

## 2024-04-09 ENCOUNTER — Encounter (HOSPITAL_COMMUNITY): Payer: Self-pay

## 2024-04-09 DIAGNOSIS — F259 Schizoaffective disorder, unspecified: Principal | ICD-10-CM

## 2024-04-09 NOTE — BH IP Treatment Plan (Signed)
 Interdisciplinary Treatment and Diagnostic Plan Update  04/09/2024 Time of Session: 10:55 AM Aaron Rubio MRN: [id number]  Principal Diagnosis: Schizoaffective disorder [name])  Secondary Diagnoses: Principal Problem:   Schizoaffective disorder (HCC)   Current Medications:  Current Facility-Administered Medications  Medication Dose Route Frequency Provider Last Rate Last Admin   acetaminophen  (TYLENOL ) tablet 650 mg  650 mg Oral Q6H PRN Lenard Calin, MD   650 mg at 04/09/24 1527   alum & mag hydroxide-simeth (MAALOX/MYLANTA) 200-200-20 MG/5ML suspension 30 mL  30 mL Oral Q4H PRN Lenard Calin, MD       haloperidol  (HALDOL ) tablet 5 mg  5 mg Oral TID PRN Lenard Calin, MD       And   diphenhydrAMINE  (BENADRYL ) capsule 50 mg  50 mg Oral TID PRN Lenard Calin, MD       haloperidol  lactate (HALDOL ) injection 5 mg  5 mg Intramuscular TID PRN Lenard Calin, MD       And   diphenhydrAMINE  (BENADRYL ) injection 50 mg  50 mg Intramuscular TID PRN Lenard Calin, MD       And   LORazepam  (ATIVAN ) injection 2 mg  2 mg Intramuscular TID PRN Lenard Calin, MD       hydrOXYzine  (ATARAX ) tablet 25 mg  25 mg Oral TID PRN [name], [name], NP       magnesium  hydroxide (MILK OF MAGNESIA) suspension 30 mL  30 mL Oral Daily PRN Lenard Calin, MD       risperiDONE  (RISPERDAL ) tablet 1 mg  1 mg Oral BID Lenard Calin, MD   1 mg at 04/09/24 1655   traZODone  (DESYREL ) tablet 50 mg  50 mg Oral QHS PRN Lenard Calin, MD       PTA Medications: Medications Prior to Admission  Medication Sig Dispense Refill Last Dose/Taking   cyclobenzaprine  (FLEXERIL ) 10 MG tablet Take 1 tablet (10 mg total) by mouth 2 (two) times daily as needed for muscle spasms. 20 tablet 0    hydrochlorothiazide  (HYDRODIURIL ) 12.5 MG tablet Take 1 tablet (12.5 mg total) by mouth daily. (Patient not taking: Reported on 01/05/2024) 30 tablet 0     Patient Stressors: Marital or family conflict   Medication change  or noncompliance    Patient Strengths: Motivation for treatment/growth  Physical Health   Treatment Modalities: Medication Management, Group therapy, Case management,  1 to 1 session with clinician, Psychoeducation, Recreational therapy.   Physician Treatment Plan for Primary Diagnosis: Schizoaffective disorder (HCC) Long Term Goal(s): Improvement in symptoms so as ready for discharge   Short Term Goals: Ability to identify changes in lifestyle to reduce recurrence of condition will improve Ability to verbalize feelings will improve Ability to disclose and discuss suicidal ideas Ability to demonstrate self-control will improve Ability to identify and develop effective coping behaviors will improve Ability to maintain clinical measurements within normal limits will improve Compliance with prescribed medications will improve Ability to identify triggers associated with substance abuse/mental health issues will improve  Medication Management: Evaluate patient's response, side effects, and tolerance of medication regimen.  Therapeutic Interventions: 1 to 1 sessions, Unit Group sessions and Medication administration.  Evaluation of Outcomes: Not Progressing  Physician Treatment Plan for Secondary Diagnosis: Principal Problem:   Schizoaffective disorder (HCC)  Long Term Goal(s): Improvement in symptoms so as ready for discharge   Short Term Goals: Ability to identify changes in lifestyle to reduce recurrence of condition will improve Ability to verbalize feelings will improve Ability to disclose and discuss suicidal ideas Ability to  demonstrate self-control will improve Ability to identify and develop effective coping behaviors will improve Ability to maintain clinical measurements within normal limits will improve Compliance with prescribed medications will improve Ability to identify triggers associated with substance abuse/mental health issues will improve     Medication  Management: Evaluate patient's response, side effects, and tolerance of medication regimen.  Therapeutic Interventions: 1 to 1 sessions, Unit Group sessions and Medication administration.  Evaluation of Outcomes: Not Progressing   RN Treatment Plan for Primary Diagnosis: Schizoaffective disorder (HCC) Long Term Goal(s): Knowledge of disease and therapeutic regimen to maintain health will improve  Short Term Goals: Ability to remain free from injury will improve, Ability to verbalize frustration and anger appropriately will improve, Ability to demonstrate self-control, Ability to participate in decision making will improve, Ability to verbalize feelings will improve, Ability to disclose and discuss suicidal ideas, Ability to identify and develop effective coping behaviors will improve, and Compliance with prescribed medications will improve  Medication Management: RN will administer medications as ordered by provider, will assess and evaluate patient's response and provide education to patient for prescribed medication. RN will report any adverse and/or side effects to prescribing provider.  Therapeutic Interventions: 1 on 1 counseling sessions, Psychoeducation, Medication administration, Evaluate responses to treatment, Monitor vital signs and CBGs as ordered, Perform/monitor CIWA, COWS, AIMS and Fall Risk screenings as ordered, Perform wound care treatments as ordered.  Evaluation of Outcomes: Not Progressing   LCSW Treatment Plan for Primary Diagnosis: Schizoaffective disorder (HCC) Long Term Goal(s): Safe transition to appropriate next level of care at discharge, Engage patient in therapeutic group addressing interpersonal concerns.  Short Term Goals: Engage patient in aftercare planning with referrals and resources, Increase social support, Increase ability to appropriately verbalize feelings, Increase emotional regulation, Facilitate acceptance of mental health diagnosis and concerns,  Facilitate patient progression through stages of change regarding substance use diagnoses and concerns, Identify triggers associated with mental health/substance abuse issues, and Increase skills for wellness and recovery  Therapeutic Interventions: Assess for all discharge needs, 1 to 1 time with Social worker, Explore available resources and support systems, Assess for adequacy in community support network, Educate family and significant other(s) on suicide prevention, Complete Psychosocial Assessment, Interpersonal group therapy.  Evaluation of Outcomes: Not Progressing   Progress in Treatment: Attending groups: No. Participating in groups: No. Taking medication as prescribed: Yes. Toleration medication: Yes. Family/Significant other contact made: Yes, individual(s) contacted:  PO Melba [phone] Patient understands diagnosis: Yes. Discussing patient identified problems/goals with staff: Yes. Medical problems stabilized or resolved: Yes. Denies suicidal/homicidal ideation: Yes. Issues/concerns per patient self-inventory: No.  New problem(s) identified:  No  New Short Term/Long Term Goal(s):    medication stabilization, elimination of SI thoughts, development of comprehensive mental wellness plan.    Patient Goals:  I want to obtain stable housing, improve my communication skills, and get back on my medications.    Discharge Plan or Barriers:  Patient recently admitted. CSW will continue to follow and assess for appropriate referrals and possible discharge planning.    Reason for Continuation of Hospitalization: Hallucinations Medication stabilization Suicidal ideation  Estimated Length of Stay:  5 - 7 days  Last 3 [place] Suicide Severity Risk Score: Flowsheet Row [facility] (Current[facility] Most recent reading at 04/07/2024  1:30 PM ED from 04/06/2024 in [facility] Most recent  reading at 04/06/2024  1:45 PM ED from 04/06/2024 in [facility] Emergency Department [facility] Most recent reading at 04/06/2024  7:41 AM  C-SSRS RISK CATEGORY No Risk No Risk No Risk    Last PHQ 2/9 Scores:     No data to display          Scribe for Treatment Team: Naeema Patlan O Franchesca Veneziano, LCSWA 04/09/2024 7:58 PM

## 2024-04-09 NOTE — Progress Notes (Signed)
 [facility] MD Progress Note  04/09/2024 10:29 AM Garett Niam Nepomuceno  MRN:  [id number]  Principal Problem: Schizoaffective disorder [facility]) Diagnosis: Principal Problem:   Schizoaffective disorder (HCC)  Reason for Admission:  Damarie M. Cardamone is a 43 year old male with a history of major depressive disorder, polysubstance abuse, schizoaffective disorder, and malingering. He presented to [facility] with auditory hallucinations consistent with decompensated schizophrenia and was transferred to the [facility] for overnight observation, where his psychotropic medications were restarted. He continued to endorse psychotic symptoms and was subsequently admitted to the [facility]. Per chart review he has had multiple prior psychiatric admissions at [facility] in April, May, June, and September 2025. His medical history includes hypertension.   24-hour Chart Review: Chart reviewed. Patient's case discussed in interdisciplinary team meeting.  Vital signs reviewed without critical value. No as needed psychotropic medication required overnight.  He slept a documented 6.5 hours.  He is adherent to taking psychotropic medication regimen. Nursing notes indicate no behavioral challenges in the unit.  Today's Assessment Notes: Chadley was seen in his room during rounds. He describes his mood as "I'm trying to come up out of this downward.  He endorses passive suicidal ideation without intent or plan, stating, "It's better for me to think about other things than what others want me to." He denies homicidal ideation and psychotic symptoms. He reports low energy and concentration, though his appetite is adequate, and he denies any sleep disturbances. He denies withdrawal symptoms or cravings and reports no side effects from his current psychiatric medications.  Turner shared that he has one week remaining on probation and expressed interest in inpatient substance  abuse treatment as well as outpatient therapy following discharge.   10/8: Navarre is calm, cooperative, and medication-compliant. He continues to spend most of his time isolated in his room, with limited participation in group sessions or unit activities. He appears to be tolerating his medications well, with no adverse effects or overt psychotic symptoms. Given his expressed interest in treatment, social work provided a list of residential substance use resources.  No medication changes are indicated today; he will continue his current treatment regimen and be encouraged to increase engagement in therapeutic activities.   Total Time spent with patient: 45 minutes  Past Psychiatric History:  Previous Psych Diagnoses: Major depressive disorder, schizophrenia, schizoaffective disorder, polysubstance abuse, and malingering. Prior inpatient treatment: Multiple prior psychiatric hospitalizations at [facility] in April, May, June, and September 2025.   Current/prior outpatient treatment: Denies  Prior rehab tx: Denies Psychotherapy tx: Denies History of suicide: Endorses passive suicidal ideation without intent or plan; denies prior suicide attempts. History of homicide: Denies homicidal ideation or history of violent behavior.   Psychiatric medication history: Prozac , Seroquel , Zyprexa , Trazodone , Hydroxyzine , Risperidone , and Gabapentin . Psychiatric medication compliance history: Reports noncompliance with current and past psychiatric medications.   Neuromodulation history: None reported. Current Psychiatrist: None. Current therapist: None.   Substance Abuse Hx: Alcohol: Denies current alcohol use. Tobacco: Denies tobacco use. Illicit drugs: Cocaine use Rx drug abuse: Denies prescription drug misuse. Rehab: Denies prior rehabilitation for substance use.   Past Medical History: Medical Diagnoses: Hypertension. Home Rx: None  Head trauma, LOC, concussions, seizures: Denies  Allergies: Denies  medication and food allergies. LMP: Not applicable (male). Contraception: Not applicable. PCP: None currently.   Family Psych History: Diagnoses: Denies family history of psychiatric illness. SA/HA: Denies family history of suicide attempts or completions. Substance use family hx: Reports father  had a history of heroin   Past Medical History:  Past Medical History:  Diagnosis Date   Hypertension    Schizophrenia (HCC)    History reviewed. No pertinent surgical history. Family History: History reviewed. No pertinent family history. Social History:  Social History   Substance and Sexual Activity  Alcohol Use Yes   Comment: occassionally     Social History   Substance and Sexual Activity  Drug Use Yes   Types: Cocaine, Marijuana   Comment: Daily; last three years    Social History   Socioeconomic History   Marital status: Married    Spouse name: Not on file   Number of children: Not on file   Years of education: Not on file   Highest education level: Not on file  Occupational History   Not on file  Tobacco Use   Smoking status: Every Day    Current packs/day: 0.30    Types: Cigarettes   Smokeless tobacco: Not on file  Vaping Use   Vaping status: Never Used  Substance and Sexual Activity   Alcohol use: Yes    Comment: occassionally   Drug use: Yes    Types: Cocaine, Marijuana    Comment: Daily; last three years   Sexual activity: Yes    Comment: Reports married with kids living in a house  Other Topics Concern   Not on file  Social History Narrative   Not on file   Social Drivers of Health   Financial Resource Strain: Not on file  Food Insecurity: Food Insecurity Present (04/07/2024)   Hunger Vital Sign    Worried About Running Out of Food in the Last Year: Sometimes true    Ran Out of Food in the Last Year: Sometimes true  Transportation Needs: Unmet Transportation Needs (04/07/2024)   PRAPARE - Administrator, Civil Service (Medical):  Yes    Lack of Transportation (Non-Medical): Yes  Physical Activity: Not on file  Stress: Not on file  Social Connections: Unknown (05/08/2022)   Received from [employer]   Social Network    Social Network: Not on file   Additional Social History:     Marital status: Single Are you sexually active?: No What is your sexual orientation?: Heterosexual Has your sexual activity been affected by drugs, alcohol, medication, or emotional stress?: No Does patient have children?: Yes How many children?: 2 How is patient's relationship with their children?: UTA - I don't know     Childhood: Denies adverse or traumatic childhood experiences. Abuse: Denies emotional, physical, or sexual abuse. Marital Status: Single. Sexual orientation: Heterosexual. Children: Two sons, ages 17 and 32. Employment: Works intermittently at [employer].   Education: Completed 12th grade. Peer Group: Limited social supports; identifies paternal cousins as local contacts. Housing: Currently unhoused. Finances: Receives Medicaid and EBT benefits.   Legal: Incarcerated for 90 days due to assault on an officer at [facility]; currently on probation until April 17, 2024; upcoming court date on October 28 for misdemeanor larceny.   Military: Denies military history.                      Current Medications: Current Facility-Administered Medications  Medication Dose Route Frequency Provider Last Rate Last Admin   acetaminophen  (TYLENOL ) tablet 650 mg  650 mg Oral Q6H PRN Lenard Calin, MD   650 mg at 04/08/24 0630   alum & mag hydroxide-simeth (MAALOX/MYLANTA) 200-200-20 MG/5ML suspension 30 mL  30 mL Oral  Q4H PRN Lenard Calin, MD       haloperidol  (HALDOL ) tablet 5 mg  5 mg Oral TID PRN Lenard Calin, MD       And   diphenhydrAMINE  (BENADRYL ) capsule 50 mg  50 mg Oral TID PRN Lenard Calin, MD       haloperidol  lactate (HALDOL ) injection 5 mg  5 mg Intramuscular TID PRN Lenard Calin, MD       And   diphenhydrAMINE  (BENADRYL ) injection 50 mg  50 mg Intramuscular TID PRN Lenard Calin, MD       And   LORazepam  (ATIVAN ) injection 2 mg  2 mg Intramuscular TID PRN Lenard Calin, MD       hydrOXYzine  (ATARAX ) tablet 25 mg  25 mg Oral TID PRN Joniqua Sidle H, NP       magnesium  hydroxide (MILK OF MAGNESIA) suspension 30 mL  30 mL Oral Daily PRN Lenard Calin, MD       risperiDONE  (RISPERDAL ) tablet 1 mg  1 mg Oral BID Lenard Calin, MD   1 mg at 04/09/24 9087   traZODone  (DESYREL ) tablet 50 mg  50 mg Oral QHS PRN Lenard Calin, MD        Lab Results: No results found for this or any previous visit (from the past 48 hours).  Blood Alcohol level:  Lab Results  Component Value Date   [facility] <15 04/06/2024   ETH <15 03/15/2024    Metabolic Disorder Labs: Lab Results  Component Value Date   HGBA1C 5.0 10/27/2023   MPG 96.8 10/27/2023   No results found for: PROLACTIN Lab Results  Component Value Date   CHOL 152 10/19/2023   TRIG 144 10/19/2023   HDL 57 10/19/2023   CHOLHDL 2.7 10/19/2023   VLDL 29 10/19/2023   LDLCALC 66 10/19/2023    Physical Findings: AIMS:  ,  ,  ,  ,  ,  ,   CIWA:    COWS:     Musculoskeletal: Strength & Muscle Tone: within normal limits Gait & Station: normal Patient leans: N/A  Psychiatric Specialty Exam:  Presentation  General Appearance:  Disheveled  Eye Contact: Fair  Speech: Clear and Coherent; Normal Rate  Speech Volume: Normal  Handedness: Right   Mood and Affect  Mood: Dysphoric  Affect: Congruent   Thought Process  Thought Processes: Coherent; Goal Directed  Descriptions of Associations:Intact  Orientation:Full (Time, Place and Person)  Thought Content:Logical  History of Schizophrenia/Schizoaffective disorder:Yes  Duration of Psychotic Symptoms:Greater than six months  Hallucinations:Hallucinations: Auditory; Visual  Ideas of Reference:Paranoia  Suicidal  Thoughts:Suicidal Thoughts: Yes, Passive SI Passive Intent and/or Plan: Without Intent; Without Plan; Without Means to Carry Out; Without Access to Means  Homicidal Thoughts:Homicidal Thoughts: No   Sensorium  Memory: Immediate Good; Recent Fair  Judgment: Poor  Insight: Lacking   Executive Functions  Concentration: Fair  Attention Span: Fair  Recall: [employer] of Knowledge: Fair  Language: Fair   Psychomotor Activity  Psychomotor Activity: Psychomotor Activity: Normal   Assets  Assets: Desire for Improvement; Resilience   Sleep  Sleep: Sleep: Poor    Physical Exam: Physical Exam Vitals and nursing note reviewed.  Constitutional:      General: He is not in acute distress.    Appearance: He is not ill-appearing.  HENT:     Mouth/Throat:     Pharynx: Oropharynx is clear.  Cardiovascular:     Rate and Rhythm: Normal rate.     Pulses: Normal pulses.  Pulmonary:     Effort: No respiratory distress.  Skin:    General: Skin is dry.  Neurological:     Mental Status: He is alert and oriented to person, place, and time.    Review of Systems  Constitutional: Negative.   Respiratory: Negative.    Cardiovascular: Negative.   Gastrointestinal: Negative.   Musculoskeletal: Negative.   Neurological: Negative.   Psychiatric/Behavioral:  Positive for depression, substance abuse and suicidal ideas (Passive SI). Negative for hallucinations and memory loss. The patient is nervous/anxious and has insomnia.    Blood pressure (!) 143/104, pulse 78, temperature 98.4 F (36.9 C), temperature source Oral, resp. rate 16, height 6' 2 (1.88 m), weight 97.1 kg, SpO2 100%. Body mass index is 27.48 kg/m.   Treatment Plan Summary: Daily contact with patient to assess and evaluate symptoms and progress in treatment and Medication management   Diagnoses / Active Problems:          Principal Problem:   Schizoaffective disorder (HCC)        PLAN: Safety and  Monitoring:             -- Voluntary admission to inpatient psychiatric unit for safety, stabilization and treatment             -- Daily contact with patient to assess and evaluate symptoms and progress in treatment             -- Patient's case to be discussed in multi-disciplinary team meeting             -- Observation Level: q15 minute checks             -- Vital signs:  q12 hours             -- Precautions: suicide, elopement, and assault   2. Psychiatric Diagnoses and Treatment:    # Schizoaffective disorder   -- Continue Risperidone  1 mg 2 times daily   -- Hydroxyzine  25 mg oral, 3 times daily as needed, anxiety -- Trazodone  50 mg, oral, daily at bedtime as needed, sleep             -- Haldol  BH Agitation Protocol (See MAR)                   3. Medical Issues Being Addressed:   N/A                                4. Labs    -- CBC: WBC 7.8, MCH 25.1, otherwise WNL             -- CMP: Total Protein 6.1, otherwise WNL             -- Ethanol: <15             -- Lipid Panel (03/17/24): Triglycerides 234, HDL 40, otherwise WNL             -- HgBA1c (03/17/2024): WNL             -- UDS: + Cocaine             -- EKG: QT/QTc 412/472    -- The risks/benefits/side-effects/alternatives to this medication were discussed in detail with the patient and time was given for questions. The patient consents to medication trial.  -- FDA -- Metabolic profile and EKG monitoring obtained while on an atypical antipsychotic (BMI: Lipid Panel: HbgA1c: QTc:)               --  Encouraged patient to participate in unit milieu and in scheduled group therapies  -- Short Term Goals: Ability to identify changes in lifestyle to reduce recurrence of condition will improve, Ability to verbalize feelings will improve, Ability to disclose and discuss suicidal ideas, Ability to demonstrate self-control will improve, Ability to identify and develop effective coping behaviors will improve, Ability to maintain  clinical measurements within normal limits will improve, Compliance with prescribed medications will improve, and Ability to identify triggers associated with substance abuse/mental health issues will improve             -- Long Term Goals: Improvement in symptoms so as ready for discharge     5. Discharge Planning:  -- Social work and case management to assist with discharge planning and identification of hospital follow-up needs prior to discharge -- Estimated LOS: 5-7 days -- Discharge Concerns: Need to establish a safety plan; Medication compliance and effectiveness -- Discharge Goals: Return home with outpatient referrals for mental health follow-up including medication management/psychotherapy      Physician Treatment Plan for Primary Diagnosis: Schizoaffective disorder (HCC) Long Term Goal(s): Improvement in symptoms so as ready for discharge   Short Term Goals: Ability to identify changes in lifestyle to reduce recurrence of condition will improve, Ability to verbalize feelings will improve, Ability to disclose and discuss suicidal ideas, Ability to demonstrate self-control will improve, Ability to identify and develop effective coping behaviors will improve, Ability to maintain clinical measurements within normal limits will improve, Compliance with prescribed medications will improve, and Ability to identify triggers associated with substance abuse/mental health issues will improve     I certify that inpatient services furnished can reasonably be expected to improve the patient's condition.     Blair Chiquita Hint, NP 04/09/2024, 10:29 AM

## 2024-04-09 NOTE — Plan of Care (Signed)
  Problem: Education: Goal: Knowledge of [facility] Education information/materials will improve Outcome: Adequate for Discharge Goal: Emotional status will improve Outcome: Progressing   Problem: Activity: Goal: Interest or engagement in activities will improve Outcome: Progressing   Problem: Coping: Goal: Ability to demonstrate self-control will improve Outcome: Progressing   Problem: Health Behavior/Discharge Planning: Goal: Identification of resources available to assist in meeting health care needs will improve Outcome: Progressing Goal: Compliance with treatment plan for underlying cause of condition will improve Outcome: Progressing

## 2024-04-09 NOTE — Group Note (Signed)
 Date:  04/09/2024 Time:  8:26 PM  Group Topic/Focus:  Wrap-Up Group:   The focus of this group is to help patients review their daily goal of treatment and discuss progress on daily workbooks.    Participation Level:  Did Not Attend  Participation Quality:  N/A  Affect:  N/A  Cognitive:  N/A  Insight: None  Engagement in Group:  N/A  Modes of Intervention:  N/A  Additional Comments:  Patient was encouraged but did not attend  Eward Mace 04/09/2024, 8:26 PM

## 2024-04-09 NOTE — Progress Notes (Signed)

## 2024-04-09 NOTE — Progress Notes (Signed)
   04/09/24 0900  Psych Admission Type (Psych Patients Only)  Admission Status Voluntary  Psychosocial Assessment  Patient Complaints None  Eye Contact Poor  Facial Expression Anxious  Affect Anxious  Speech Logical/coherent  Interaction Minimal  Motor Activity Slow  Appearance/Hygiene In scrubs  Behavior Characteristics Cooperative  Mood Anxious  Thought Process  Coherency WDL  Content WDL  Delusions None reported or observed  Perception WDL  Hallucination None reported or observed  Judgment Poor  Confusion None  Danger to Self  Current suicidal ideation? Denies  Agreement Not to Harm Self Yes  Description of Agreement verbal  Danger to Others  Danger to Others None reported or observed

## 2024-04-09 NOTE — Group Note (Signed)
 Date:  04/09/2024 Time:  5:45 AM  Group Topic/Focus:  Wrap-Up Group:   The focus of this group is to help patients review their daily goal of treatment and discuss progress on daily workbooks.    Additional Comments:  Pt was encouraged, but opted out of attending wrap up group this evening.  Aaron Rubio 04/09/2024, 5:45 AM

## 2024-04-09 NOTE — Progress Notes (Signed)
(  Sleep Hours) - 6.5  (Any PRNs that were needed, meds refused, or side effects to meds)- none (Any disturbances and when (visitation, over night)- none (Concerns raised by the patient)- none (SI/HI/AVH)- denies all

## 2024-04-09 NOTE — Group Note (Signed)
 Date:  04/09/2024 Time:  9:46 AM  Group Topic/Focus:  Emotional Education:   The focus of this group is to discuss what feelings/emotions are, and how they are experienced. Goals Group:   The focus of this group is to help patients establish daily goals to achieve during treatment and discuss how the patient can incorporate goal setting into their daily lives to aide in recovery. Orientation:   The focus of this group is to educate the patient on the purpose and policies of crisis stabilization and provide a format to answer questions about their admission.  The group details unit policies and expectations of patients while admitted.    Participation Level:  Did Not Attend   Logan LITTIE [name] 04/09/2024, 9:46 AM

## 2024-04-09 NOTE — Progress Notes (Signed)
 Aaron Rubio   Type of Note: Substance Use Treatment  Patient given list of substance use treatment facilities. Pt encouraged to make phone calls. Pt agreeable.  Of note, pt does not qualify for [facility] due to insurance.  Signed:  Derel Mcglasson, LCSW-A 04/09/2024  1:48 PM

## 2024-04-09 NOTE — Progress Notes (Signed)
 Aaron Rubio   Type of Note: PO Melba  Per pt's request this Clinical research associate called PO Melba [phone]) to alert her about pt's hospitalization. PO states pt did call her yesterday and made her aware but she was thankful for confirmation from this Clinical research associate.   Would like to be updated with patient's discharge date once set.   Signed:  Earlee Herald, LCSW-A 04/09/2024  3:58 PM

## 2024-04-09 NOTE — Group Note (Signed)
 Recreation Therapy Group Note   Group Topic:Leisure Education  Group Date: 04/09/2024 Start Time: 0931 End Time: 1015 Facilitators: Marzetta Lanza-McCall, LRT,CTRS Location: [address] Day[address]   Group Topic: Leisure Education  Goal Area(s) Addresses:  Patient will identify positive leisure activities for use post discharge. Patient will identify at least one positive benefit of the program they create. Patient will work effectively work with peer by sharing ideas and contributing to Music therapist.  Behavioral Response:    Intervention: Innovation, Group Presentation   Activity: In pairs or individually, patients were asked to create a program with their teammate. Team's were tasked with designing a program, including a Name, Demographic geared towards, Hours of operation, Location and Benefits. Patients would then present ideas to group.   Education:  Leisure Scientist, physiological, Special educational needs teacher, Teamwork, Discharge Planning  Education Outcome: Acknowledges education/In group clarification offered/Needs additional education.    Affect/Mood: N/A   Participation Level: Did not attend    Clinical Observations/Individualized Feedback:      Plan: Continue to engage patient in RT group sessions 2-3x/week.   Waylen Depaolo-McCall, LRT,CTRS  04/09/2024 1:10 PM

## 2024-04-09 NOTE — Group Note (Signed)
 Date:  04/09/2024 Time:  5:13 PM  Group Topic/Focus: Emotional Regulation - Cooking Up Calm Emotional Education:   The focus of this group is to discuss what feelings/emotions are, and how they are experienced. Managing Feelings:   The focus of this group is to identify what feelings patients have difficulty handling and develop a plan to handle them in a healthier way upon discharge. Group Type: Psychoeducational / Skills-Based Group Objective:  Support participants in identifying, understanding, and managing emotions using a structured, relatable approach. Group Summary: Icebreaker: Mood Menu Discussed emotional regulation as a recipe: Name it, Tame it, Frame it Explored emotional triggers and body cues Played Emotion Charades Completed Emotional Regulation Recipe worksheet Shared coping strategies and prep steps Closed with group reflection Skills Practiced: Emotional awareness Coping strategy selection Peer engagement    Additional Comments:  patient did not attend   Irina Okelly N Navie Lamoreaux 04/09/2024, 5:13 PM

## 2024-04-09 NOTE — Plan of Care (Signed)
   Problem: Education: Goal: Knowledge of [place] [facility] Education information/materials will improve Outcome: Progressing Goal: Emotional status will improve Outcome: Progressing Goal: Mental status will improve Outcome: Progressing

## 2024-04-10 NOTE — Plan of Care (Signed)
 Nurse discussed anxiety, depression and coping skills with patient.

## 2024-04-10 NOTE — Group Note (Signed)
 Recreation Therapy Group Note   Group Topic:Other  Group Date: 04/10/2024 Start Time: 1304 End Time: 1350 Facilitators: Caylen Yardley-McCall, LRT,CTRS Location: [address] Dayroom   Activity Description/Intervention: Therapeutic Drumming. Patients with peers and staff were given the opportunity to engage in a leader facilitated [facility] Group Empowerment Drumming Circle with staff from the [employer], in partnership with [employer]. Teaching laboratory technician and trained [employer], Norleen Mon leading with LRT observing and documenting intervention and pt response. This evidenced-based practice targets 7 areas of health and wellbeing in the human experience including: stress-reduction, exercise, self-expression, camaraderie/support, nurturing, spirituality, and music-making (leisure).   Goal Area(s) Addresses:  Patient will engage in pro-social way in music group.  Patient will follow directions of drum leader on the first prompt. Patient will demonstrate no behavioral issues during group.  Patient will identify if a reduction in stress level occurs as a result of participation in therapeutic drum circle.    Education: Leisure exposure, Pharmacologist, Musical expression, Discharge Planning   Affect/Mood: N/A   Participation Level: Did not attend    Clinical Observations/Individualized Feedback:      Plan: Continue to engage patient in RT group sessions 2-3x/week.   Sidrah Harden-McCall, LRT,CTRS 04/10/2024 4:04 PM

## 2024-04-10 NOTE — Group Note (Signed)
 Date:  04/10/2024 Time:  9:28 AM  Group Topic/Focus:  Goals Group:   The focus of this group is to help patients establish daily goals to achieve during treatment and discuss how the patient can incorporate goal setting into their daily lives to aide in recovery.    Participation Level:  Did Not Attend  Participation Quality:  Did Not Attend  Affect:   Did Not Attend  Cognitive:   Did Not Attend  Insight: None  Engagement in Group:   Did Not Attend  Modes of Intervention:   Did Not Attend  Additional Comments:  Did Not Attend  Kadien Lineman A Waleska Buttery 04/10/2024, 9:28 AM

## 2024-04-10 NOTE — Progress Notes (Signed)
 [facility] MD Progress Note  04/10/2024 3:17 PM Aaron Rubio  MRN:  [id number] Subjective:  Aaron Rubio was seen this afternoon on rounds. He feels taht he is not sleeping or eating very well. His affect is very flat and his thoughts are concrete. He has low energy today, but otherwise no new physical complaints. He denies hallucinations, thoughts of harm to self or others.  Principal Problem: Schizoaffective disorder (HCC) Diagnosis: Principal Problem:   Schizoaffective disorder (HCC)  Total Time spent with patient: 15 minutes  Past Psychiatric History: Previous Psych Diagnoses: Major depressive disorder, schizophrenia, schizoaffective disorder, polysubstance abuse, and malingering. Prior inpatient treatment: Multiple prior psychiatric hospitalizations at [facility] in April, May, June, and September 2025.   Current/prior outpatient treatment: Denies  Prior rehab tx: Denies Psychotherapy tx: Denies History of suicide: Endorses passive suicidal ideation without intent or plan; denies prior suicide attempts. History of homicide: Denies homicidal ideation or history of violent behavior.   Psychiatric medication history: Prozac , Seroquel , Zyprexa , Trazodone , Hydroxyzine , Risperidone , and Gabapentin . Psychiatric medication compliance history: Reports noncompliance with current and past psychiatric medications.   Neuromodulation history: None reported. Current Psychiatrist: None. Current therapist: None.  Past Medical History:  Past Medical History:  Diagnosis Date   Hypertension    Schizophrenia (HCC)    History reviewed. No pertinent surgical history. Family History: History reviewed. No pertinent family history. Family Psychiatric  History: see H&P Social History:  Social History   Substance and Sexual Activity  Alcohol Use Yes   Comment: occassionally     Social History   Substance and Sexual Activity  Drug Use Yes   Types: Cocaine, Marijuana   Comment: Daily; last three years     Social History   Socioeconomic History   Marital status: Married    Spouse name: Not on file   Number of children: Not on file   Years of education: Not on file   Highest education level: Not on file  Occupational History   Not on file  Tobacco Use   Smoking status: Every Day    Current packs/day: 0.30    Types: Cigarettes   Smokeless tobacco: Not on file  Vaping Use   Vaping status: Never Used  Substance and Sexual Activity   Alcohol use: Yes    Comment: occassionally   Drug use: Yes    Types: Cocaine, Marijuana    Comment: Daily; last three years   Sexual activity: Yes    Comment: Reports married with kids living in a house  Other Topics Concern   Not on file  Social History Narrative   Not on file   Social Drivers of Health   Financial Resource Strain: Not on file  Food Insecurity: Food Insecurity Present (04/07/2024)   Hunger Vital Sign    Worried About Running Out of Food in the Last Year: Sometimes true    Ran Out of Food in the Last Year: Sometimes true  Transportation Needs: Unmet Transportation Needs (04/07/2024)   PRAPARE - Administrator, Civil Service (Medical): Yes    Lack of Transportation (Non-Medical): Yes  Physical Activity: Not on file  Stress: Not on file  Social Connections: Unknown (05/08/2022)   Received from [employer]   Social Network    Social Network: Not on file   Additional Social History:                         Sleep: Poor Estimated Sleeping Duration (Last 24 Hours):  8.00-9.00 hours  Appetite:  Poor  Current Medications: Current Facility-Administered Medications  Medication Dose Route Frequency Provider Last Rate Last Admin   acetaminophen  (TYLENOL ) tablet 650 mg  650 mg Oral Q6H PRN Lenard Calin, MD   650 mg at 04/09/24 1527   alum & mag hydroxide-simeth (MAALOX/MYLANTA) 200-200-20 MG/5ML suspension 30 mL  30 mL Oral Q4H PRN Lenard Calin, MD       haloperidol  (HALDOL ) tablet 5 mg  5 mg Oral TID  PRN Lenard Calin, MD       And   diphenhydrAMINE  (BENADRYL ) capsule 50 mg  50 mg Oral TID PRN Lenard Calin, MD       haloperidol  lactate (HALDOL ) injection 5 mg  5 mg Intramuscular TID PRN Lenard Calin, MD       And   diphenhydrAMINE  (BENADRYL ) injection 50 mg  50 mg Intramuscular TID PRN Lenard Calin, MD       And   LORazepam  (ATIVAN ) injection 2 mg  2 mg Intramuscular TID PRN Lenard Calin, MD       hydrOXYzine  (ATARAX ) tablet 25 mg  25 mg Oral TID PRN Blair, [name], NP   25 mg at 04/09/24 2111   magnesium  hydroxide (MILK OF MAGNESIA) suspension 30 mL  30 mL Oral Daily PRN Lenard Calin, MD       risperiDONE  (RISPERDAL ) tablet 1 mg  1 mg Oral BID Lenard Calin, MD   1 mg at 04/10/24 0846   traZODone  (DESYREL ) tablet 50 mg  50 mg Oral QHS PRN Lenard Calin, MD   50 mg at 04/09/24 2111    Lab Results: No results found for this or any previous visit (from the past 48 hours).  Blood Alcohol level:  Lab Results  Component Value Date   [facility] <15 04/06/2024   ETH <15 03/15/2024    Metabolic Disorder Labs: Lab Results  Component Value Date   HGBA1C 5.0 10/27/2023   MPG 96.8 10/27/2023   No results found for: PROLACTIN Lab Results  Component Value Date   CHOL 152 10/19/2023   TRIG 144 10/19/2023   HDL 57 10/19/2023   CHOLHDL 2.7 10/19/2023   VLDL 29 10/19/2023   LDLCALC 66 10/19/2023    Physical Findings: AIMS:  ,  ,  ,  ,  ,  ,   CIWA:    COWS:     Musculoskeletal: Strength & Muscle Tone: within normal limits Gait & Station: normal Patient leans: N/A  Psychiatric Specialty Exam:  Presentation  General Appearance:  Casual  Eye Contact: Fair  Speech: Normal Rate  Speech Volume: Normal  Handedness: Right   Mood and Affect  Mood: Euthymic  Affect: Flat   Thought Process  Thought Processes: Coherent  Descriptions of Associations:Loose  Orientation:Full (Time, Place and Person)  Thought Content:Illogical  History of  Schizophrenia/Schizoaffective disorder:Yes  Duration of Psychotic Symptoms:Greater than six months  Hallucinations:Hallucinations: None  Ideas of Reference:Paranoia  Suicidal Thoughts:Suicidal Thoughts: No  Homicidal Thoughts:Homicidal Thoughts: No   Sensorium  Memory: Immediate Fair; Remote Poor  Judgment: Poor  Insight: Poor   Executive Functions  Concentration: Fair  Attention Span: Fair  Recall: [employer] of Knowledge: Fair  Language: Fair   Psychomotor Activity  Psychomotor Activity: Psychomotor Activity: Decreased   Assets  Assets: Leisure Time   Sleep  Sleep: Sleep: Fair    Physical Exam: Physical Exam Vitals and nursing note reviewed.  Constitutional:      Appearance: Normal appearance.  HENT:  Head: Normocephalic and atraumatic.  Eyes:     Extraocular Movements: Extraocular movements intact.  Pulmonary:     Effort: Pulmonary effort is normal.  Musculoskeletal:        General: Normal range of motion.     Cervical back: Normal range of motion.  Neurological:     General: No focal deficit present.     Mental Status: He is alert and oriented to person, place, and time.  Psychiatric:        Mood and Affect: Affect is flat.        Behavior: Behavior is cooperative.    Review of Systems  Constitutional:  Positive for malaise/fatigue. Negative for chills and fever.  Gastrointestinal:  Negative for nausea and vomiting.  Musculoskeletal:  Negative for joint pain and neck pain.  Neurological:  Negative for tremors.  Psychiatric/Behavioral:  Negative for hallucinations and suicidal ideas.    Blood pressure (!) 140/82, pulse 62, temperature 98.4 F (36.9 C), temperature source Oral, resp. rate 18, height 6' 2 (1.88 m), weight 97.1 kg, SpO2 100%. Body mass index is 27.48 kg/m.   Treatment Plan Summary: Daily contact with patient to assess and evaluate symptoms and progress in treatment and Medication management      Diagnoses / Active Problems:          Principal Problem:   Schizoaffective disorder (HCC)        PLAN: Safety and Monitoring:             -- Voluntary admission to inpatient psychiatric unit for safety, stabilization and treatment             -- Daily contact with patient to assess and evaluate symptoms and progress in treatment             -- Patient's case to be discussed in multi-disciplinary team meeting             -- Observation Level: q15 minute checks             -- Vital signs:  q12 hours             -- Precautions: suicide, elopement, and assault   2. Psychiatric Diagnoses and Treatment:    # Schizoaffective disorder   -- Continue Risperidone  1 mg 2 times daily   -- Hydroxyzine  25 mg oral, 3 times daily as needed, anxiety -- Trazodone  50 mg, oral, daily at bedtime as needed, sleep             -- Haldol  BH Agitation Protocol (See MAR)                   3. Medical Issues Being Addressed:   N/A                                4. Labs    -- CBC: WBC 7.8, MCH 25.1, otherwise WNL             -- CMP: Total Protein 6.1, otherwise WNL             -- Ethanol: <15             -- Lipid Panel (03/17/24): Triglycerides 234, HDL 40, otherwise WNL             -- HgBA1c (03/17/2024): WNL             -- UDS: + Cocaine             --  EKG: QT/QTc 412/472    -- The risks/benefits/side-effects/alternatives to this medication were discussed in detail with the patient and time was given for questions. The patient consents to medication trial.  -- FDA -- Metabolic profile and EKG monitoring obtained while on an atypical antipsychotic (BMI: Lipid Panel: HbgA1c: QTc:)               -- Encouraged patient to participate in unit milieu and in scheduled group therapies  -- Short Term Goals: Ability to identify changes in lifestyle to reduce recurrence of condition will improve, Ability to verbalize feelings will improve, Ability to disclose and discuss suicidal ideas, Ability to demonstrate  self-control will improve, Ability to identify and develop effective coping behaviors will improve, Ability to maintain clinical measurements within normal limits will improve, Compliance with prescribed medications will improve, and Ability to identify triggers associated with substance abuse/mental health issues will improve             -- Long Term Goals: Improvement in symptoms so as ready for discharge     5. Discharge Planning:  -- Social work and case management to assist with discharge planning and identification of hospital follow-up needs prior to discharge -- Estimated LOS: 5-7 days -- Discharge Concerns: Need to establish a safety plan; Medication compliance and effectiveness -- Discharge Goals: Return home with outpatient referrals for mental health follow-up including medication management/psychotherapy      Physician Treatment Plan for Primary Diagnosis: Schizoaffective disorder (HCC) Long Term Goal(s): Improvement in symptoms so as ready for discharge   Short Term Goals: Ability to identify changes in lifestyle to reduce recurrence of condition will improve, Ability to verbalize feelings will improve, Ability to disclose and discuss suicidal ideas, Ability to demonstrate self-control will improve, Ability to identify and develop effective coping behaviors will improve, Ability to maintain clinical measurements within normal limits will improve, Compliance with prescribed medications will improve, and Ability to identify triggers associated with substance abuse/mental health issues will improve     I certify that inpatient services furnished can reasonably be expected to improve the patient's condition.    Corean Anette Potters, MD 04/10/2024, 3:17 PM

## 2024-04-10 NOTE — Progress Notes (Signed)
(  Sleep Hours) -8.0 (Any PRNs that were needed, meds refused, or side effects to meds)-prn hydroxyzine  and trazodone  @ 2111  (Any disturbances and when (visitation, over night)-none (Concerns raised by the patient)- none (SI/HI/AVH)- denies all

## 2024-04-10 NOTE — Group Note (Signed)
 Date:  04/10/2024 Time:  8:47 PM  Group Topic/Focus:  Wrap-Up Group:   The focus of this group is to help patients review their daily goal of treatment and discuss progress on daily workbooks.    Participation Level:  Did Not Attend  Participation Quality:  N/A  Affect:  N/A  Cognitive:  N/A  Insight: None  Engagement in Group:  N/A  Modes of Intervention:  N/A  Additional Comments:  Patient was encouraged but did not attend  Eward Mace 04/10/2024, 8:47 PM

## 2024-04-10 NOTE — Group Note (Signed)
 LCSW Group Therapy Note   Group Date: 04/10/2024 Start Time: 1100 End Time: 1200   Participation:  did not attend  Type of Therapy:  Group Therapy  Topic:  Understanding Your Path to Change  Objective:  The goal is to help individuals understand the stages of change, identify where they currently are in the process, and provide actionable next steps to continue moving forward in their journey of change.  Goals: - Learn about the six stages of change:  Precontemplation, Contemplation, Preparation, Action, Maintenance, and Relapse - Reflect on Current Change Efforts:  Recognize which stage participants are in regarding a personal change. - Plan Next Steps for Moving Forward:  Create an action plan based on their current stage of change.  Summary:  In this session, we explored the Stages of Change as a framework to understand the process of change.  We discussed how each stage helps individuals recognize where they are in their personal journey and used the Stages of Change Worksheet for self-reflection. Participants answered questions to better understand their current stage, challenges, and progress. We also emphasized the importance of moving forward, even if setbacks (Relapse) occur, and created actionable steps to help participants continue progressing. By the end of the session, participants gained a clearer understanding of their path to change and left with a clear plan for next steps.  Therapeutic Modalities:  Elements of CBT (cognitive restructuring, problem solving)  Element of DBT (mindfulness, distress tolerance)   Romie Keeble O Monzerat Handler, LCSWA 04/10/2024  12:28 PM

## 2024-04-10 NOTE — Progress Notes (Signed)
 D:  Patient's self inventory sheet, patient has fair sleep, sleep medication given.  Fair appetite, low energy level, good concentration.  Rated depression 6, hopeless 4, anxiety 5.  Denied withdrawals.  Denied SI.  Physical problems, feel very tired.  Denied physical pain.  Goal is to feel physically better.  Plans to focus and participate.  Plans to gain understanding of condition.  Plans to discharge home. A:  Medications administered per MD orders.  Emotional support and encouragement given patient. R:  Denied SI and HI, contracts for safety.  Denied A/V hallucinations.  Safety maintained with 15 minute checks.

## 2024-04-11 ENCOUNTER — Other Ambulatory Visit (HOSPITAL_COMMUNITY): Payer: Self-pay

## 2024-04-11 DIAGNOSIS — F329 Major depressive disorder, single episode, unspecified: Secondary | ICD-10-CM

## 2024-04-11 DIAGNOSIS — F1420 Cocaine dependence, uncomplicated: Secondary | ICD-10-CM

## 2024-04-11 DIAGNOSIS — Z5900 Homelessness unspecified: Secondary | ICD-10-CM

## 2024-04-11 MED ORDER — RISPERIDONE 1 MG PO TABS
1.0000 mg | ORAL_TABLET | Freq: Two times a day (BID) | ORAL | 0 refills | Status: DC
  Filled 2024-04-11 (×2): qty 60, 30d supply, fill #0

## 2024-04-11 NOTE — Plan of Care (Signed)
  Problem: Activity: Goal: Sleeping patterns will improve Outcome: Progressing   Problem: Coping: Goal: Ability to verbalize frustrations and anger appropriately will improve Outcome: Progressing Goal: Ability to demonstrate self-control will improve Outcome: Progressing   Problem: Health Behavior/Discharge Planning: Goal: Identification of resources available to assist in meeting health care needs will improve Outcome: Progressing Goal: Compliance with treatment plan for underlying cause of condition will improve Outcome: Progressing   Problem: Safety: Goal: Periods of time without injury will increase Outcome: Progressing

## 2024-04-11 NOTE — Discharge Summary (Signed)
 Physician Discharge Summary Note  Patient:  Aaron Rubio is an 43 y.o., male MRN:  [id number] DOB:  09-Oct-1980 Patient phone:  [phone] (home)  Patient address:   [address] [place] [address],  Total Time spent with patient: 20 minutes  Date of Admission:  04/07/2024 Date of Discharge: 04/12/2024  Reason for Admission:  per H&P 04/05/2024: Sergio HERO. Zeleznik is a 43 year old male with a history of major depressive disorder, polysubstance abuse, schizoaffective disorder, and malingering. He presented to [facility] ED with auditory hallucinations consistent with decompensated schizophrenia and was transferred to the [facility] for overnight observation, where his psychotropic medications were restarted. He continued to endorse psychotic symptoms and was subsequently admitted to the [facility]. Per chart review he has had multiple prior psychiatric admissions at [facility] in April, May, June, and September 2025. His medical history includes hypertension.   Principal Problem: Schizoaffective disorder [facility]) Discharge Diagnoses: Principal Problem:   Schizoaffective disorder (HCC) Active Problems:   Homelessness   MDD (major depressive disorder)   Cocaine use disorder, severe, dependence (HCC)   Past Psychiatric History: Previous Psych Diagnoses: Major depressive disorder, schizophrenia, schizoaffective disorder, polysubstance abuse, and malingering. Prior inpatient treatment: Multiple prior psychiatric hospitalizations at [facility] in April, May, June, and September 2025.   Current/prior outpatient treatment: Denies  Prior rehab tx: Denies Psychotherapy tx: Denies History of suicide: Endorses passive suicidal ideation without intent or plan; denies prior suicide attempts. History of homicide: Denies homicidal ideation or history of violent behavior.   Psychiatric medication history: Prozac , Seroquel ,  Zyprexa , Trazodone , Hydroxyzine , Risperidone , and Gabapentin . Psychiatric medication compliance history: Reports noncompliance with current and past psychiatric medications.   Neuromodulation history: None reported. Current Psychiatrist: None. Current therapist: None.  Past Medical History:  Past Medical History:  Diagnosis Date   Hypertension    Schizophrenia (HCC)    History reviewed. No pertinent surgical history. Family History: History reviewed. No pertinent family history. Family Psychiatric  History: denies Social History:  Social History   Substance and Sexual Activity  Alcohol Use Yes   Comment: occassionally     Social History   Substance and Sexual Activity  Drug Use Yes   Types: Cocaine, Marijuana   Comment: Daily; last three years    Social History   Socioeconomic History   Marital status: Married    Spouse name: Not on file   Number of children: Not on file   Years of education: Not on file   Highest education level: Not on file  Occupational History   Not on file  Tobacco Use   Smoking status: Every Day    Current packs/day: 0.30    Types: Cigarettes   Smokeless tobacco: Not on file  Vaping Use   Vaping status: Never Used  Substance and Sexual Activity   Alcohol use: Yes    Comment: occassionally   Drug use: Yes    Types: Cocaine, Marijuana    Comment: Daily; last three years   Sexual activity: Yes    Comment: Reports married with kids living in a house  Other Topics Concern   Not on file  Social History Narrative   Not on file   Social Drivers of Health   Financial Resource Strain: Not on file  Food Insecurity: Food Insecurity Present (04/07/2024)   Hunger Vital Sign    Worried About Running Out of Food in the Last Year: Sometimes true    Ran Out  of Food in the Last Year: Sometimes true  Transportation Needs: Unmet Transportation Needs (04/07/2024)   PRAPARE - Administrator, Civil Service (Medical): Yes    Lack of  Transportation (Non-Medical): Yes  Physical Activity: Not on file  Stress: Not on file  Social Connections: Unknown (05/08/2022)   Received from [facility]   Social Network    Social Network: Not on file    Hospital Course:  During the course of patient's hospitalization, the 15-minute checks were adequate to ensure patient's safety. Patient did not exhibit erratic or aggressive behavior and was compliant with scheduled medication. Patient was recommended for outpatient psychiatry follow-up.  At the time of discharge patient is not reporting any acute suicidal/homicidal ideations/AVH, delusional thoughts or paranoia. Patient did not appear to be responding to any internal stimuli. Patient feels more confident about self-care & in managing their mental health problems. Patient currently denies any new issues or concerns. Education and supportive counseling provided throughout patient's hospital stay & upon discharge.  patient was safely detoxed fom stimulants during the course of his stay, and any symptoms of withdrawal were addressed and have since resolved. At time of discharge the patient is not experiencing clinical syptoms of withdrawal and denies having any subjective symptoms. At time of discharge CIWA was 0.   Today upon discharge evaluation, the patient gives a mood of appropriate to circumstances. Patient denies any specific concerns and has no new physical complaints. Patient slept well, appetite good, regular bowel movements. Patient feels that the medications have been helpful & is in agreement to continue current treatment regimen as recommended. Patient was able to engage in safety planning including plan to return to [facility] inpatient unit, the nearest emergency room or contact emergency services if patient feels unable to maintain their own safety or the safety of others. Patient had no further questions, comments, or concerns. Patient left [facility]  inpatient unit with all personal belongings in no apparent distress. Transportation per safe transport to home was arranged for patient.  Physical Findings: AIMS:  , ,  ,  ,  ,  ,   CIWA:    COWS:     Musculoskeletal: Strength & Muscle Tone: within normal limits Gait & Station: normal Patient leans: N/A   Psychiatric Specialty Exam:  Presentation  General Appearance:  Casual  Eye Contact: Good  Speech: Normal Rate  Speech Volume: Normal  Handedness: Right   Mood and Affect  Mood: Euthymic  Affect: Flat   Thought Process  Thought Processes: Goal Directed  Descriptions of Associations:Intact  Orientation:Full (Time, Place and Person)  Thought Content:Logical  History of Schizophrenia/Schizoaffective disorder:Yes  Duration of Psychotic Symptoms:Greater than six months  Hallucinations:Hallucinations: None  Ideas of Reference:None  Suicidal Thoughts:Suicidal Thoughts: No  Homicidal Thoughts:Homicidal Thoughts: No   Sensorium  Memory: Immediate Fair; Recent Fair; Remote Poor  Judgment: Fair  Insight: Fair   Art therapist  Concentration: Fair  Attention Span: Fair  Recall: Fair  Fund of Knowledge: Fair  Language: Good   Psychomotor Activity  Psychomotor Activity: Psychomotor Activity: Normal   Assets  Assets: Desire for Improvement; Leisure Time; Physical Health   Sleep  Sleep: Sleep: Fair  Estimated Sleeping Duration (Last 24 Hours): 5.75-7.50 hours   Physical Exam: Physical Exam Vitals and nursing note reviewed.  Constitutional:      Appearance: Normal appearance.  HENT:     Head: Normocephalic and atraumatic.  Eyes:     Extraocular Movements: Extraocular  movements intact.  Pulmonary:     Effort: Pulmonary effort is normal.  Musculoskeletal:        General: Normal range of motion.     Cervical back: Normal range of motion.  Neurological:     General: No focal deficit present.     Mental  Status: He is alert and oriented to person, place, and time.  Psychiatric:        Behavior: Behavior normal.    Review of Systems  Constitutional:  Negative for chills and fever.  Respiratory:  Negative for cough and shortness of breath.   Cardiovascular:  Negative for chest pain.  Gastrointestinal:  Negative for constipation, diarrhea, nausea and vomiting.  Genitourinary:  Negative for dysuria.  Musculoskeletal:  Negative for joint pain and myalgias.  Skin:  Negative for Rubio.  Neurological:  Negative for tremors.  Psychiatric/Behavioral:  Negative for hallucinations and suicidal ideas.    Blood pressure (!) 129/97, pulse 83, temperature 98.1 F (36.7 C), temperature source Oral, resp. rate 18, height 6' 2 (1.88 m), weight 97.1 kg, SpO2 100%. Body mass index is 27.48 kg/m.   Social History   Tobacco Use  Smoking Status Every Day   Current packs/day: 0.30   Types: Cigarettes  Smokeless Tobacco Not on file   Tobacco Cessation:  A prescription for an FDA-approved tobacco cessation medication provided at discharge   Blood Alcohol level:  Lab Results  Component Value Date   [facility] <15 04/06/2024   ETH <15 03/15/2024    Metabolic Disorder Labs:  Lab Results  Component Value Date   HGBA1C 5.0 10/27/2023   MPG 96.8 10/27/2023   No results found for: PROLACTIN Lab Results  Component Value Date   CHOL 152 10/19/2023   TRIG 144 10/19/2023   HDL 57 10/19/2023   CHOLHDL 2.7 10/19/2023   VLDL 29 10/19/2023   LDLCALC 66 10/19/2023    See Psychiatric Specialty Exam and Suicide Risk Assessment completed by Attending Physician prior to discharge.  Discharge destination:  Other:  IRC  Is patient on multiple antipsychotic therapies at discharge:  No   Has Patient had three or more failed trials of antipsychotic monotherapy by history:  No  Recommended Plan for Multiple Antipsychotic Therapies: NA  Discharge Instructions     Diet - low sodium heart healthy   Complete  by: As directed    Discharge instructions   Complete by: As directed    Prescriptions for new medications provided for the patient to bridge to follow up appointment. The patient was informed that refills for these prescriptions are generally not provided, and patient is encouraged to attend all follow up appointments to address medication refills and adjustments.   Today's discharge was reviewed with treatment team, and the team is in agreement that the patient is ready for discharge. The patient is was of the discharge plan for today and has been given opportunity to ask questions. At time of discharge, the patient does not vocalize any acute harm to self or others, is goal directed, able to advocate for self and organizational baseline.   At discharge, the patient is instructed to:  Take all medications as prescribed. Report any adverse effects and or reactions from the medicines to her outpatient provider promptly.  Do not engage in alcohol and/or illegal drug use while on prescription medicines.  In the event of worsening symptoms, patient is instructed to call the crisis hotline, 911 and or go to the nearest ED for appropriate evaluation and treatment  of symptoms.  Follow-up with primary care provider for further care of medical issues, concerns and or health care needs. * Substance abuse follow up: it is recommended that you follow up with community support treatment, like AA/NA. It is also recommended that the patient attend 90 meetings in 90 days, otherwise known as 90 in 90 *antipsychotic or neuroleptic medication: this medication requires metabolic monitoring, which may include labs for cholesterol, blood sugar and other markers, as well as monitoring EKG and weight.   Increase activity slowly   Complete by: As directed       Allergies as of 04/11/2024   No Known Allergies      Medication List     STOP taking these medications    cyclobenzaprine  10 MG tablet Commonly  known as: FLEXERIL    hydrochlorothiazide  12.5 MG tablet Commonly known as: HYDRODIURIL        TAKE these medications      Indication  risperiDONE  1 MG tablet Commonly known as: RISPERDAL  Take 1 tablet (1 mg total) by mouth 2 (two) times daily.  Indication: Schizophrenia        Follow-up Information     [facility] Follow up on 04/18/2024.   Why: You have a hospital follow up appointment for therapy and medication management services on 04/18/24 at 10:30 am .  The appointment will be Virtual, telehealth. Contact information: [address]  [address] [place] [address] [phone]                 Follow-up recommendations:  Activity:  as tolerated Diet:  low sodium, low fat  Comments:  follow up as scheduled  Signed: Corean Anette Potters, MD 04/11/2024, 11:53 AM

## 2024-04-11 NOTE — Group Note (Signed)
 Recreation Therapy Group Note   Group Topic:General Recreation  Group Date: 04/11/2024 Start Time: 0930 End Time: 1000 Facilitators: Eloni Darius-McCall, LRT,CTRS Location: [address] Day[address]   Group Topic/Focus: General Recreation   Goal Area(s) Addresses:  Patient will use appropriate interactions with peers.   Patient will work together to come up with answers.  Behavioral Response:   Intervention: Worksheet, Music  Activity: Dentist. Patients were given a worksheet (front and back) of brain teasers. Patients could work together to figure out each individual puzzle presented. While working, LRT played music. LRT and patients went over the answers when patients completed the assignment.     Affect/Mood: N/A   Participation Level: Did not attend    Clinical Observations/Individualized Feedback:     Plan: Continue to engage patient in RT group sessions 2-3x/week.   Hensley Aziz-McCall, LRT,CTRS 04/11/2024 11:35 AM

## 2024-04-11 NOTE — BHH Suicide Risk Assessment (Signed)
 [facility] Discharge Suicide Risk Assessment   Principal Problem: Schizoaffective disorder [facility]) Discharge Diagnoses: Principal Problem:   Schizoaffective disorder (HCC) Active Problems:   Homelessness   MDD (major depressive disorder)   Cocaine use disorder, severe, dependence (HCC)   Total Time spent with patient: 20 minutes  Musculoskeletal: Strength & Muscle Tone: within normal limits Gait & Station: normal Patient leans: N/A  Psychiatric Specialty Exam  Presentation  General Appearance:  Casual  Eye Contact: Good  Speech: Normal Rate  Speech Volume: Normal  Handedness: Right   Mood and Affect  Mood: Euthymic  Duration of Depression Symptoms: Greater than two weeks  Affect: Flat   Thought Process  Thought Processes: Goal Directed  Descriptions of Associations:Intact  Orientation:Full (Time, Place and Person)  Thought Content:Logical  History of Schizophrenia/Schizoaffective disorder:Yes  Duration of Psychotic Symptoms:Greater than six months  Hallucinations:Hallucinations: None  Ideas of Reference:None  Suicidal Thoughts:Suicidal Thoughts: No  Homicidal Thoughts:Homicidal Thoughts: No   Sensorium  Memory: Immediate Fair; Recent Fair; Remote Poor  Judgment: Fair  Insight: Fair   Chartered certified accountant: Fair  Attention Span: Fair  Recall: Fair  Fund of Knowledge: Fair  Language: Good   Psychomotor Activity  Psychomotor Activity: Psychomotor Activity: Normal   Assets  Assets: Desire for Improvement; Leisure Time; Physical Health   Sleep  Sleep: Sleep: Fair  Estimated Sleeping Duration (Last 24 Hours): 5.75-7.50 hours  Physical Exam: Physical Exam Vitals and nursing note reviewed.  Constitutional:      Appearance: Normal appearance.  HENT:     Head: Normocephalic and atraumatic.  Eyes:     Extraocular Movements: Extraocular movements intact.  Pulmonary:     Effort: Pulmonary effort is  normal.  Musculoskeletal:        General: Normal range of motion.     Cervical back: Normal range of motion.  Neurological:     General: No focal deficit present.     Mental Status: He is alert and oriented to person, place, and time.  Psychiatric:        Behavior: Behavior normal.    Review of Systems  Constitutional:  Negative for chills and fever.  Respiratory:  Negative for cough and shortness of breath.   Cardiovascular:  Negative for chest pain.  Gastrointestinal:  Negative for constipation and diarrhea.  Genitourinary:  Negative for dysuria.  Musculoskeletal:  Negative for joint pain and myalgias.  Skin:  Negative for rash.  Neurological:  Negative for tremors.  Psychiatric/Behavioral:  Negative for hallucinations and suicidal ideas.    Blood pressure (!) 129/97, pulse 83, temperature 98.1 F (36.7 C), temperature source Oral, resp. rate 18, height 6' 2 (1.88 m), weight 97.1 kg, SpO2 100%. Body mass index is 27.48 kg/m.  Mental Status Per Nursing Assessment::   On Admission:  Suicidal ideation indicated by patient  Demographic Factors:  Male, Low socioeconomic status, and Unemployed  Loss Factors: NA  Historical Factors: Prior suicide attempts and Family history of mental illness or substance abuse  Risk Reduction Factors:   Positive coping skills or problem solving skills  Continued Clinical Symptoms:  Schizophrenia:   Depressive state  Cognitive Features That Contribute To Risk:  Loss of executive function    Suicide Risk:  Minimal: No identifiable suicidal ideation.  Patients presenting with no risk factors but with morbid ruminations; may be classified as minimal risk based on the severity of the depressive symptoms   Follow-up Information     [facility] Follow up on 04/18/2024.   Why:  You have a hospital follow up appointment for therapy and medication management services on 04/18/24 at 10:30 am .  The appointment will be Virtual, telehealth. Contact  information: [address]  [address] [place] [address] [phone]                 Plan Of Care/Follow-up recommendations:  Activity:  as tolerated Diet:  low sodium, low fat  Corean Anette Potters, MD 04/11/2024, 11:52 AM

## 2024-04-11 NOTE — Progress Notes (Signed)
  [facility] Adult Case Management Discharge Plan :  Will you be returning to the same living situation after discharge:  No. At discharge, do you have transportation home?: Yes,  CSW arranged [employer] taxi for 1:00PM (unplanned d/c) Do you have the ability to pay for your medications: Yes,  pt has active health insurance coverage  Release of information consent forms completed and in the chart;  Patient's signature needed at discharge.  Patient to Follow up at:  Follow-up Information     [facility] Follow up on 04/18/2024.   Why: You have a hospital follow up appointment for therapy and medication management services on 04/18/24 at 10:30 am .  The appointment will be Virtual, telehealth. Contact information: [address]  [address] [place] [address] [phone]                 Next level of care provider has access to [facility] Link:no  Safety Planning and Suicide Prevention discussed: Yes,  completed with patient at discharge *attempted to contact PO Melba [phone] on day of discharge to alert her of pt's discharge per pt's request, unable to contact, VM full (3 attempts made).      Has patient been referred to the Quitline?: Patient refused referral for treatment  Patient has been referred for addiction treatment: Information given for SA treatment, pt not interested at this time.   Jenkins LULLA Primer, LCSWA 04/11/2024, 11:39 AM

## 2024-04-11 NOTE — Progress Notes (Signed)
 Patient discharged to home via taxi. Discharge instructions, all required discharge documents and information about follow-up appointment given to pt with verbalization of understanding. All personal belongings returned to pt at time of discharge. Pt escorted to lobby by RN at 1412.   04/11/24 0935  Psych Admission Type (Psych Patients Only)  Admission Status Voluntary/72 hour document signed  Psychosocial Assessment  Patient Complaints None  Eye Contact Brief  Facial Expression Flat  Affect Flat  Speech Logical/coherent  Interaction Minimal  Motor Activity Slow  Appearance/Hygiene Unremarkable  Behavior Characteristics Cooperative  Mood Anxious  Thought Process  Coherency WDL  Content WDL  Delusions None reported or observed  Perception WDL  Hallucination None reported or observed  Judgment Impaired  Confusion None  Danger to Self  Current suicidal ideation? Denies  Agreement Not to Harm Self Yes  Description of Agreement Verbal  Danger to Others  Danger to Others None reported or observed

## 2024-04-11 NOTE — Plan of Care (Signed)
   Problem: Education: Goal: Mental status will improve Outcome: Progressing Goal: Verbalization of understanding the information provided will improve Outcome: Progressing   Problem: Activity: Goal: Interest or engagement in activities will improve Outcome: Progressing

## 2024-04-11 NOTE — Transportation (Signed)
 04/11/2024  Aaron Rubio DOB: 06-12-1981 MRN: [id number]   RIDER WAIVER AND RELEASE OF LIABILITY  For the purposes of helping with transportation needs, [facility] partners with outside transportation providers (taxi companies, [place], Catering manager.) to give [facility] patients or other approved people the choice of on-demand rides Public librarian) to our buildings for non-emergency visits.  By using [employer], I, the person signing this document, on behalf of myself and/or any legal minors (in my care using the [employer]), agree:  Science writer given to me are supplied by independent, outside transportation providers who do not work for, or have any affiliation with, [employer]. [place] is not a transportation company. [facility] has no control over the quality or safety of the rides I get using [employer]. [place] has no control over whether any outside ride will happen on time or not. [facility] gives no guarantee on the reliability, quality, safety, or availability on any rides, or that no mistakes will happen. I know and accept that traveling by vehicle (car, truck, SVU, fleeta, bus, taxi, etc.) has risks of serious injuries such as disability, being paralyzed, and death. I know and agree the risk of using [employer] is mine alone, and not [employer]. [employer] are provided as is and as are available. The transportation providers are in charge for all inspections and care of the vehicles used to provide these rides. I agree not to take legal action against [facility], its agents, employees, officers, directors, representatives, insurers, attorneys, assigns, successors, subsidiaries, and affiliates at any time for any reasons related directly or indirectly to using [employer]. I also agree not to take legal action against [place] or its affiliates for any injury, death, or damage to property caused by or related to  using [employer]. I have read this Waiver and Release of Liability, and I understand the terms used in it and their legal meaning. This Waiver is freely and voluntarily given with the understanding that my right (or any legal minors) to legal action against [place] relating to [employer] is knowingly given up to use these services.   I attest that I read the Ride Waiver and Release of Liability to Aaron Rubio, gave Aaron Rubio the opportunity to ask questions and answered the questions asked (if any). I affirm that Aaron Rubio then provided consent for assistance with transportation.

## 2024-04-11 NOTE — Progress Notes (Signed)
(  Sleep Hours) - 9.25 (Any PRNs that were needed, meds refused, or side effects to meds)- Hydroxyzine  25 mg for anxiety - Effective (Any disturbances and when (visitation, over night)- none (Concerns raised by the patient)- none (SI/HI/AVH)- none

## 2024-04-11 NOTE — Progress Notes (Signed)
 Aaron Rubio   Type of Note: Engineer, materials with patient this morning at bedside to f/u on substance use treatment and discharge plans. Pt states he would like to discharge to the [facility], preferably today. Not interested in substance use treatment.  MD notified.  Signed:  Shalonda Sachse, LCSW-A 04/11/2024  11:24 AM

## 2024-04-12 ENCOUNTER — Other Ambulatory Visit: Payer: Self-pay

## 2024-04-12 ENCOUNTER — Emergency Department (HOSPITAL_COMMUNITY)
Admission: EM | Admit: 2024-04-12 | Discharge: 2024-04-13 | Disposition: A | Discharge status: Home / Self Care | Attending: Emergency Medicine | Admitting: Emergency Medicine

## 2024-04-12 ENCOUNTER — Other Ambulatory Visit (HOSPITAL_COMMUNITY): Payer: Self-pay

## 2024-04-12 DIAGNOSIS — I10 Essential (primary) hypertension: Secondary | ICD-10-CM | POA: Diagnosis not present

## 2024-04-12 DIAGNOSIS — M25551 Pain in right hip: Secondary | ICD-10-CM | POA: Diagnosis present

## 2024-04-12 DIAGNOSIS — M79672 Pain in left foot: Secondary | ICD-10-CM | POA: Insufficient documentation

## 2024-04-12 DIAGNOSIS — K130 Diseases of lips: Secondary | ICD-10-CM | POA: Diagnosis present

## 2024-04-12 DIAGNOSIS — M79671 Pain in right foot: Secondary | ICD-10-CM | POA: Diagnosis not present

## 2024-04-12 DIAGNOSIS — M25552 Pain in left hip: Secondary | ICD-10-CM | POA: Insufficient documentation

## 2024-04-12 DIAGNOSIS — M25559 Pain in unspecified hip: Secondary | ICD-10-CM

## 2024-04-12 NOTE — ED Provider Triage Note (Signed)
 Emergency Medicine Provider Triage Evaluation Note  Aaron Rubio , a 43 y.o. male  was evaluated in triage.  Arrived via EMS for unclear reason.  Repots some pain and discoloration in his fingers earlier.  Tells triage RN that he has not taken his psychiatric medications.  Denies trauma.  Review of Systems  Positive: As above Negative: As above  Physical Exam  There were no vitals taken for this visit. Gen:   Awake, no distress. Foul smelling. Resp:  Normal effort  MSK:   Moves extremities without difficulty  Other:  Tangential speech. Capillary refill normal in bl hands.  Medical Decision Making  Medically screening exam initiated at 11:54 PM.  Appropriate orders placed.  Aaron Rubio was informed that the remainder of the evaluation will be completed by another provider, this initial triage assessment does not replace that evaluation, and the importance of remaining in the ED until their evaluation is complete.  Alert and nontoxic appearing. Speech tangential making it difficulty to isolate specific complaint for presentation to the ED tonight. Likely underlying psychiatric component but does not appear to be a harm to self or others. Not reacting to internal stimuli.   Aaron Sor, PA-C 04/12/24 2357
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
## 2024-04-13 ENCOUNTER — Emergency Department (HOSPITAL_COMMUNITY)

## 2024-04-13 ENCOUNTER — Encounter (HOSPITAL_COMMUNITY): Payer: Self-pay | Admitting: *Deleted

## 2024-04-13 ENCOUNTER — Other Ambulatory Visit (HOSPITAL_COMMUNITY): Payer: Self-pay

## 2024-04-13 ENCOUNTER — Emergency Department (HOSPITAL_COMMUNITY)
Admission: EM | Admit: 2024-04-13 | Discharge: 2024-04-14 | Disposition: A | Discharge status: Home / Self Care | Source: Home / Self Care | Attending: Emergency Medicine | Admitting: Emergency Medicine

## 2024-04-13 DIAGNOSIS — I10 Essential (primary) hypertension: Secondary | ICD-10-CM | POA: Insufficient documentation

## 2024-04-13 DIAGNOSIS — K130 Diseases of lips: Secondary | ICD-10-CM | POA: Insufficient documentation

## 2024-04-13 MED ORDER — LIDOCAINE 5 % EX PTCH: 1.0000 | MEDICATED_PATCH | CUTANEOUS | 0 refills | Status: DC

## 2024-04-13 MED ORDER — NAPROXEN 250 MG PO TABS
500.0000 mg | ORAL_TABLET | Freq: Once | ORAL | Status: AC
  Administered 2024-04-13: 500 mg via ORAL
  Filled 2024-04-13: qty 2

## 2024-04-13 MED ORDER — ACETAMINOPHEN 325 MG PO TABS
650.0000 mg | ORAL_TABLET | Freq: Once | ORAL | Status: DC
  Filled 2024-04-13: qty 2

## 2024-04-13 MED ORDER — LIDOCAINE 5 % EX PTCH
1.0000 | MEDICATED_PATCH | CUTANEOUS | 0 refills | Status: DC
  Filled 2024-04-13: qty 10, 10d supply, fill #0

## 2024-04-13 NOTE — ED Notes (Signed)
 Attempted to check pt CBG but pt refused stating EMS already checked it. Attempted to explain that we can check it with out glucometer and he became angry and was yelling that we aren't listening to him. PA notified

## 2024-04-13 NOTE — ED Notes (Signed)
 Pt taken to xray

## 2024-04-13 NOTE — ED Provider Notes (Signed)
 [place] EMERGENCY DEPARTMENT AT [facility] Provider Note   CSN: [id number] Arrival date & time: 04/13/24  2017     Patient presents with: Chapped Lips   Aaron Rubio is a 43 y.o. male with HTN, schizophrenia who presents to ED concerned for chapped lips. Patient stating that he woke up from a nap and noticed a thin layer of clear stuff on his lips. Patient does not remember anything being placed on his lips.   Patient also stating that he needs more iron and needs strong pain medications because he Is very old but not going into further detail about this.  Denies fever, chest pain, dyspnea, cough, nausea, vomiting, diarrhea, dysuria, hematuria, hematochezia.    HPI     Prior to Admission medications   Medication Sig Start Date End Date Taking? Authorizing Provider  lidocaine  (LIDODERM ) 5 % Place 1 patch onto the skin daily. Remove & Discard patch within 12 hours or as directed by MD 04/13/24   Palumbo, April, MD  risperiDONE  (RISPERDAL ) 1 MG tablet Take 1 tablet (1 mg total) by mouth 2 (two) times daily. 04/11/24   Leigh Corean Massa, MD    Allergies: Patient has no known allergies.    Review of Systems  HENT:         Chapped lips    Updated Vital Signs BP 139/88 (BP Location: Right Arm)   Pulse 63   Temp 97.8 F (36.6 C)   Resp 16   Ht 6' 2 (1.88 m)   Wt 97.1 kg   SpO2 100%   BMI 27.48 kg/m   Physical Exam Vitals and nursing note reviewed.  Constitutional:      General: He is not in acute distress.    Appearance: He is not ill-appearing or toxic-appearing.  HENT:     Head: Normocephalic and atraumatic.     Mouth/Throat:     Mouth: Mucous membranes are moist.     Comments: Chapped lips without surrounding erythema or bleeding or yellow crusting or skin breakdown/blisters. No oropharyngeal lesions.  Eyes:     General: No scleral icterus.       Right eye: No discharge.        Left eye: No discharge.     Conjunctiva/sclera:  Conjunctivae normal.  Cardiovascular:     Rate and Rhythm: Normal rate and regular rhythm.     Pulses: Normal pulses.     Heart sounds: Normal heart sounds. No murmur heard. Pulmonary:     Effort: Pulmonary effort is normal. No respiratory distress.     Breath sounds: Normal breath sounds. No wheezing, rhonchi or rales.  Abdominal:     General: Abdomen is flat. Bowel sounds are normal.     Palpations: Abdomen is soft.  Musculoskeletal:     Right lower leg: No edema.     Left lower leg: No edema.  Skin:    General: Skin is warm and dry.     Findings: No rash.  Neurological:     General: No focal deficit present.     Mental Status: He is alert and oriented to person, place, and time. Mental status is at baseline.  Psychiatric:        Mood and Affect: Mood normal.     (all labs ordered are listed, but only abnormal results are displayed) Labs Reviewed - No data to display  EKG: None  Radiology: DG HIP UNILAT WITH PELVIS 2-3 VIEWS RIGHT Result Date: 04/13/2024 EXAM: 2 OR MORE  VIEW(S) XRAY OF THE UNILATERAL HIP 04/13/2024 05:50:27 AM COMPARISON: 05/11/2022 CLINICAL HISTORY: Bilateral hip pain FINDINGS: BONES AND JOINTS: Normal. SOFT TISSUES: The soft tissues are unremarkable. IMPRESSION: 1. No acute findings. Electronically signed by: Waddell Calk MD 04/13/2024 06:24 AM EDT RP Workstation: [id number]   DG HIP UNILAT WITH PELVIS 2-3 VIEWS LEFT Result Date: 04/13/2024 EXAM: 2 OR MORE VIEW(S) XRAY OF THE UNILATERAL HIP 04/13/2024 05:50:27 AM COMPARISON: 05/11/2022. CLINICAL HISTORY: Bilateral hip pain. FINDINGS: BONES AND JOINTS: No acute fracture or focal osseous lesion. The hip joint is maintained. No significant degenerative changes. SOFT TISSUES: The soft tissues are unremarkable. IMPRESSION: 1. No acute findings. Electronically signed by: Waddell Calk MD 04/13/2024 06:23 AM EDT RP Workstation: [id number]     Procedures   Medications Ordered in the ED  multivitamin with  minerals tablet 1 tablet (has no administration in time range)  acetaminophen  (TYLENOL ) tablet 650 mg (has no administration in time range)                                    Medical Decision Making  This patient presents to the ED for concern of chapped lips, this involves an extensive number of treatment options, and is a complaint that carries with it a high risk of complications and morbidity.  The differential diagnosis includes irritant contact dermatitis, DRESS, atopic dermatitis, anaphylaxis, SJS/TEN   Co morbidities that complicate the patient evaluation  HTN, schizophrenia   Additional history obtained:  04/06/2024 CBC: No concern for anemia. 04/06/2024 CMP: No concern for kidney/liver damage or electrolyte abnormalities.   Problem List / ED Course / Critical interventions / Medication management  Patient presents ED concern for chapped lips.  Patient also requesting iron supplementation and strong pain medicine but not going into much detail about this to stating that he is old.  Physical exam with mildly chapped lips.  Rest of physical exam reassuring.  Patient afebrile with stable vitals. No concerning physical exam findings for more emergent process such as SJS/TEN. Recent labs from 8 days ago are reassuring.  Provided patient with multivitamin, and Tylenol .  Nurse looking for chapstick in the department to provide patient with. Patient to follow-up with PCP. I have reviewed the patients home medicines and have made adjustments as needed The patient has been appropriately medically screened and/or stabilized in the ED. I have low suspicion for any other emergent medical condition which would require further screening, evaluation or treatment in the ED or require inpatient management. At time of discharge the patient is hemodynamically stable and in no acute distress. I have discussed work-up results and diagnosis with patient and answered all questions. Patient is agreeable  with discharge plan. We discussed strict return precautions for returning to the emergency department and they verbalized understanding.     Social Determinants of Health:  none       Final diagnoses:  Chapped lips    ED Discharge Orders     None          Hoy Nidia FALCON, [place] 04/14/24 0013    Haze Lonni PARAS, MD 04/14/24 [phone]

## 2024-04-13 NOTE — ED Provider Notes (Incomplete)
  [place] EMERGENCY DEPARTMENT AT [facility] Provider Note   CSN: [id number] Arrival date & time: 04/13/24  2017     Patient presents with: Chapped Lips   Aaron Rubio is a 43 y.o. male with HTN, schizophrenia who presents to ED concerned for chapped lips. Patient stating that he woke up from a nap and noticed a thin layer of clear stuff on his lips. Patient does not remember anything being placed on his lips.   Patient also stating that he needs more iron and needs strong pain medications because he Is very old but not going into further detail about this.  Denies fever, chest pain, dyspnea, cough, nausea, vomiting, diarrhea, dysuria, hematuria, hematochezia.   {Add pertinent medical, surgical, social history, OB history to HPI:32947} HPI     Prior to Admission medications   Medication Sig Start Date End Date Taking? Authorizing Provider  lidocaine  (LIDODERM ) 5 % Place 1 patch onto the skin daily. Remove & Discard patch within 12 hours or as directed by MD 04/13/24   Nettie, April, MD  risperiDONE  (RISPERDAL ) 1 MG tablet Take 1 tablet (1 mg total) by mouth 2 (two) times daily. 04/11/24   Leigh Corean Massa, MD    Allergies: Patient has no known allergies.    Review of Systems  Updated Vital Signs BP 139/88 (BP Location: Right Arm)   Pulse 63   Temp 97.8 F (36.6 C)   Resp 16   Ht 6' 2 (1.88 m)   Wt 97.1 kg   SpO2 100%   BMI 27.48 kg/m   Physical Exam  (all labs ordered are listed, but only abnormal results are displayed) Labs Reviewed - No data to display  EKG: None  Radiology: DG HIP UNILAT WITH PELVIS 2-3 VIEWS RIGHT Result Date: 04/13/2024 EXAM: 2 OR MORE VIEW(S) XRAY OF THE UNILATERAL HIP 04/13/2024 05:50:27 AM COMPARISON: 05/11/2022 CLINICAL HISTORY: Bilateral hip pain FINDINGS: BONES AND JOINTS: Normal. SOFT TISSUES: The soft tissues are unremarkable. IMPRESSION: 1. No acute findings. Electronically signed by: Waddell Calk  MD 04/13/2024 06:24 AM EDT RP Workstation: [id number]   DG HIP UNILAT WITH PELVIS 2-3 VIEWS LEFT Result Date: 04/13/2024 EXAM: 2 OR MORE VIEW(S) XRAY OF THE UNILATERAL HIP 04/13/2024 05:50:27 AM COMPARISON: 05/11/2022. CLINICAL HISTORY: Bilateral hip pain. FINDINGS: BONES AND JOINTS: No acute fracture or focal osseous lesion. The hip joint is maintained. No significant degenerative changes. SOFT TISSUES: The soft tissues are unremarkable. IMPRESSION: 1. No acute findings. Electronically signed by: Waddell Calk MD 04/13/2024 06:23 AM EDT RP Workstation: [id number]    {Document cardiac monitor, telemetry assessment procedure when appropriate:32947} Procedures   Medications Ordered in the ED - No data to display    {Click here for ABCD2, HEART and other calculators REFRESH Note before signing:1}                              Medical Decision Making  ***  {Document critical care time when appropriate  Document review of labs and clinical decision tools ie CHADS2VASC2, etc  Document your independent review of radiology images and any outside records  Document your discussion with family members, caretakers and with consultants  Document social determinants of health affecting pt's care  Document your decision making why or why not admission, treatments were needed:32947:::1}   Final diagnoses:  None    ED Discharge Orders     None
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
## 2024-04-13 NOTE — ED Provider Notes (Signed)
 [place] EMERGENCY DEPARTMENT AT [facility] Provider Note   CSN: [id number] Arrival date & time: 04/12/24  2337     Patient presents with: Generalized Body Aches   Renell Ewing Fandino is a 43 y.o. male.   The history is provided by the patient.  Hip Pain The current episode started more than 1 week ago. The problem occurs constantly. The problem has not changed since onset.Pertinent negatives include no chest pain, no abdominal pain, no headaches and no shortness of breath. Nothing aggravates the symptoms. Nothing relieves the symptoms. He has tried nothing for the symptoms. The treatment provided no relief.  Patient presents for ongoing foot back and hip pain.  Has had Xrays of the back and foot but not the hips.       Prior to Admission medications   Medication Sig Start Date End Date Taking? Authorizing Provider  risperiDONE  (RISPERDAL ) 1 MG tablet Take 1 tablet (1 mg total) by mouth 2 (two) times daily. 04/11/24   Leigh Corean Massa, MD    Allergies: Patient has no known allergies.    Review of Systems  Respiratory:  Negative for shortness of breath.   Cardiovascular:  Negative for chest pain.  Gastrointestinal:  Negative for abdominal pain.  Musculoskeletal:  Positive for arthralgias.  Neurological:  Negative for headaches.  All other systems reviewed and are negative.   Updated Vital Signs BP (!) 123/92   Pulse (!) 102   Temp 98.1 F (36.7 C) (Oral)   Resp 18   Ht 6' 2 (1.88 m)   Wt 97.1 kg   SpO2 100%   BMI 27.48 kg/m   Physical Exam Vitals and nursing note reviewed.  Constitutional:      General: He is not in acute distress.    Appearance: Normal appearance. He is well-developed. He is not diaphoretic.  HENT:     Head: Normocephalic and atraumatic.     Nose: Nose normal.  Eyes:     Conjunctiva/sclera: Conjunctivae normal.     Pupils: Pupils are equal, round, and reactive to light.  Cardiovascular:     Rate and Rhythm: Normal rate  and regular rhythm.     Pulses: Normal pulses.     Heart sounds: Normal heart sounds.  Pulmonary:     Effort: Pulmonary effort is normal.     Breath sounds: Normal breath sounds. No wheezing or rales.  Abdominal:     General: Bowel sounds are normal.     Palpations: Abdomen is soft.     Tenderness: There is no abdominal tenderness. There is no guarding or rebound.  Musculoskeletal:        General: No swelling, tenderness, deformity or signs of injury. Normal range of motion.     Cervical back: Normal range of motion and neck supple.     Right lower leg: No edema.     Left lower leg: No edema.  Skin:    General: Skin is warm and dry.     Capillary Refill: Capillary refill takes less than 2 seconds.  Neurological:     General: No focal deficit present.     Mental Status: He is alert.     Deep Tendon Reflexes: Reflexes normal.  Psychiatric:        Thought Content: Thought content normal.     (all labs ordered are listed, but only abnormal results are displayed) Labs Reviewed  CBG MONITORING, ED    EKG: None  Radiology: No results found.   Procedures  Medications Ordered in the ED  acetaminophen  (TYLENOL ) tablet 650 mg (650 mg Oral Not Given 04/13/24 0459)  naproxen  (NAPROSYN ) tablet 500 mg (500 mg Oral Given 04/13/24 0519)                                    Medical Decision Making Patient with ongoing back hip and foot pain   Amount and/or Complexity of Data Reviewed Independent Historian: EMS    Details: See above  External Data Reviewed: labs, radiology and notes.    Details: Previous ED visits reviewed  Labs: ordered.    Details: Refused  Radiology: ordered and independent interpretation performed.    Details: No hip fractures by me   Risk OTC drugs. Prescription drug management. Risk Details: No fractures is able to ambulate without assistance.  PO challenged.  No fractures.  No emergent conditions at this time.  Stable for discharge with close  follow up.       Final diagnoses:  None   No signs of systemic illness or infection. The patient is nontoxic-appearing on exam and vital signs are within normal limits.  I have reviewed the triage vital signs and the nursing notes. Pertinent labs & imaging results that were available during my care of the patient were reviewed by me and considered in my medical decision making (see chart for details). After history, exam, and medical workup I feel the patient has been appropriately medically screened and is safe for discharge home. Pertinent diagnoses were discussed with the patient. Patient was given return precautions.    ED Discharge Orders     None          Amil Bouwman, MD 04/13/24 9384

## 2024-04-14 ENCOUNTER — Emergency Department (HOSPITAL_COMMUNITY)
Admission: EM | Admit: 2024-04-14 | Discharge: 2024-04-15 | Disposition: A | Discharge status: Home / Self Care | Attending: Emergency Medicine | Admitting: Emergency Medicine

## 2024-04-14 ENCOUNTER — Other Ambulatory Visit: Payer: Self-pay

## 2024-04-14 ENCOUNTER — Encounter (HOSPITAL_COMMUNITY): Payer: Self-pay

## 2024-04-14 DIAGNOSIS — Z5900 Homelessness unspecified: Secondary | ICD-10-CM | POA: Diagnosis not present

## 2024-04-14 DIAGNOSIS — F200 Paranoid schizophrenia: Secondary | ICD-10-CM | POA: Diagnosis not present

## 2024-04-14 DIAGNOSIS — R253 Fasciculation: Secondary | ICD-10-CM | POA: Diagnosis not present

## 2024-04-14 DIAGNOSIS — R252 Cramp and spasm: Secondary | ICD-10-CM | POA: Diagnosis present

## 2024-04-14 DIAGNOSIS — M62838 Other muscle spasm: Secondary | ICD-10-CM | POA: Diagnosis not present

## 2024-04-14 DIAGNOSIS — F1910 Other psychoactive substance abuse, uncomplicated: Secondary | ICD-10-CM | POA: Diagnosis not present

## 2024-04-14 DIAGNOSIS — Z91148 Patient's other noncompliance with medication regimen for other reason: Secondary | ICD-10-CM | POA: Diagnosis not present

## 2024-04-14 DIAGNOSIS — F209 Schizophrenia, unspecified: Secondary | ICD-10-CM | POA: Diagnosis present

## 2024-04-14 MED ORDER — ACETAMINOPHEN 325 MG PO TABS
650.0000 mg | ORAL_TABLET | Freq: Once | ORAL | Status: AC
  Administered 2024-04-14: 650 mg via ORAL
  Filled 2024-04-14: qty 2

## 2024-04-14 MED ORDER — ADULT MULTIVITAMIN W/MINERALS CH
1.0000 | ORAL_TABLET | Freq: Once | ORAL | Status: AC
  Administered 2024-04-14: 1 via ORAL
  Filled 2024-04-14: qty 1

## 2024-04-14 NOTE — ED Triage Notes (Signed)
 Patient in ED today with complaints of twitching and jerking when he is sitting and thinking for a long time. He states that he has had this problem since he was a kid.

## 2024-04-14 NOTE — Discharge Instructions (Addendum)
 Please follow-up with your primary care provider.  Seek emergency care if experiencing any new or worsening symptoms.

## 2024-04-15 ENCOUNTER — Ambulatory Visit (INDEPENDENT_AMBULATORY_CARE_PROVIDER_SITE_OTHER)
Admission: EM | Admit: 2024-04-15 | Discharge: 2024-04-16 | Disposition: A | Discharge status: Home / Self Care | Source: Home / Self Care

## 2024-04-15 DIAGNOSIS — Z91148 Patient's other noncompliance with medication regimen for other reason: Secondary | ICD-10-CM | POA: Diagnosis not present

## 2024-04-15 DIAGNOSIS — F200 Paranoid schizophrenia: Secondary | ICD-10-CM | POA: Insufficient documentation

## 2024-04-15 DIAGNOSIS — F1910 Other psychoactive substance abuse, uncomplicated: Secondary | ICD-10-CM | POA: Insufficient documentation

## 2024-04-15 DIAGNOSIS — Z5900 Homelessness unspecified: Secondary | ICD-10-CM | POA: Insufficient documentation

## 2024-04-15 LAB — CBC WITH DIFFERENTIAL/PLATELET
Abs Immature Granulocytes: 0.02 K/uL (ref 0.00–0.07)
Basophils Absolute: 0.1 K/uL (ref 0.0–0.1)
Basophils Relative: 1 %
Eosinophils Absolute: 0.6 K/uL — ABNORMAL HIGH (ref 0.0–0.5)
Eosinophils Relative: 7 %
HCT: 40.7 % (ref 39.0–52.0)
Hemoglobin: 12.9 g/dL — ABNORMAL LOW (ref 13.0–17.0)
Immature Granulocytes: 0 %
Lymphocytes Relative: 32 %
Lymphs Abs: 2.4 K/uL (ref 0.7–4.0)
MCH: 26 pg (ref 26.0–34.0)
MCHC: 31.7 g/dL (ref 30.0–36.0)
MCV: 81.9 fL (ref 80.0–100.0)
Monocytes Absolute: 0.7 K/uL (ref 0.1–1.0)
Monocytes Relative: 9 %
Neutro Abs: 3.8 K/uL (ref 1.7–7.7)
Neutrophils Relative %: 51 %
Platelets: 239 K/uL (ref 150–400)
RBC: 4.97 MIL/uL (ref 4.22–5.81)
RDW: 13.7 % (ref 11.5–15.5)
WBC: 7.6 K/uL (ref 4.0–10.5)
nRBC: 0 % (ref 0.0–0.2)

## 2024-04-15 LAB — COMPREHENSIVE METABOLIC PANEL WITH GFR
ALT: 21 U/L (ref 0–44)
AST: 29 U/L (ref 15–41)
Albumin: 3.1 g/dL — ABNORMAL LOW (ref 3.5–5.0)
Alkaline Phosphatase: 55 U/L (ref 38–126)
Anion gap: 8 (ref 5–15)
BUN: 20 mg/dL (ref 6–20)
CO2: 26 mmol/L (ref 22–32)
Calcium: 8.7 mg/dL — ABNORMAL LOW (ref 8.9–10.3)
Chloride: 105 mmol/L (ref 98–111)
Creatinine, Ser: 1.23 mg/dL (ref 0.61–1.24)
GFR, Estimated: >60 mL/min (ref >60)
Glucose, Bld: 89 mg/dL (ref 70–99)
Potassium: 4.3 mmol/L (ref 3.5–5.1)
Sodium: 139 mmol/L (ref 135–145)
Total Bilirubin: 0.3 mg/dL (ref 0.0–1.2)
Total Protein: 5.1 g/dL — ABNORMAL LOW (ref 6.5–8.1)

## 2024-04-15 LAB — POCT URINE DRUG SCREEN - MANUAL ENTRY (I-SCREEN)
POC Amphetamine UR: NOT DETECTED
POC Buprenorphine (BUP): NOT DETECTED
POC Cocaine UR: POSITIVE — AB
POC Marijuana UR: NOT DETECTED
POC Methadone UR: NOT DETECTED
POC Methamphetamine UR: NOT DETECTED
POC Morphine: NOT DETECTED
POC Oxazepam (BZO): NOT DETECTED — AB
POC Oxycodone UR: NOT DETECTED
POC Secobarbital (BAR): NOT DETECTED

## 2024-04-15 LAB — ETHANOL: Alcohol, Ethyl (B): <15 mg/dL (ref <15)

## 2024-04-15 MED ORDER — HALOPERIDOL LACTATE 5 MG/ML IJ SOLN: 10.0000 mg | Freq: Three times a day (TID) | INTRAMUSCULAR | Status: DC | PRN

## 2024-04-15 MED ORDER — HALOPERIDOL LACTATE 5 MG/ML IJ SOLN: 5.0000 mg | Freq: Three times a day (TID) | INTRAMUSCULAR | Status: DC | PRN

## 2024-04-15 MED ORDER — LORAZEPAM 2 MG/ML IJ SOLN: 2.0000 mg | Freq: Three times a day (TID) | INTRAMUSCULAR | Status: DC | PRN

## 2024-04-15 MED ORDER — DIPHENHYDRAMINE HCL 50 MG PO CAPS: 50.0000 mg | ORAL_CAPSULE | Freq: Three times a day (TID) | ORAL | Status: DC | PRN

## 2024-04-15 MED ORDER — DIPHENHYDRAMINE HCL 50 MG/ML IJ SOLN: 50.0000 mg | Freq: Three times a day (TID) | INTRAMUSCULAR | Status: DC | PRN

## 2024-04-15 MED ORDER — DIPHENHYDRAMINE HCL 50 MG PO CAPS
50.0000 mg | ORAL_CAPSULE | Freq: Once | ORAL | Status: AC
  Administered 2024-04-15: 50 mg via ORAL
  Filled 2024-04-15: qty 1

## 2024-04-15 MED ORDER — ALUM & MAG HYDROXIDE-SIMETH 200-200-20 MG/5ML PO SUSP: 30.0000 mL | ORAL | Status: DC | PRN

## 2024-04-15 MED ORDER — CYCLOBENZAPRINE HCL 10 MG PO TABS
5.0000 mg | ORAL_TABLET | Freq: Once | ORAL | Status: AC
  Administered 2024-04-15: 5 mg via ORAL
  Filled 2024-04-15: qty 1

## 2024-04-15 MED ORDER — ACETAMINOPHEN 325 MG PO TABS
650.0000 mg | ORAL_TABLET | Freq: Four times a day (QID) | ORAL | Status: DC | PRN
  Administered 2024-04-15: 650 mg via ORAL
  Filled 2024-04-15: qty 2

## 2024-04-15 MED ORDER — HALOPERIDOL 5 MG PO TABS: 5.0000 mg | ORAL_TABLET | Freq: Three times a day (TID) | ORAL | Status: DC | PRN

## 2024-04-15 MED ORDER — ACETAMINOPHEN 325 MG PO TABS
325.0000 mg | ORAL_TABLET | Freq: Once | ORAL | Status: AC
  Administered 2024-04-15: 325 mg via ORAL
  Filled 2024-04-15: qty 1

## 2024-04-15 MED ORDER — OLANZAPINE 7.5 MG PO TABS
7.5000 mg | ORAL_TABLET | Freq: Once | ORAL | Status: AC
  Administered 2024-04-15: 7.5 mg via ORAL
  Filled 2024-04-15: qty 1

## 2024-04-15 MED ORDER — MAGNESIUM HYDROXIDE 400 MG/5ML PO SUSP: 30.0000 mL | Freq: Every day | ORAL | Status: DC | PRN

## 2024-04-15 NOTE — ED Notes (Signed)
 Patient provided food and drink by RN. Patient requesting muscle relaxer prior to dc. MD notified.

## 2024-04-15 NOTE — Progress Notes (Signed)
   04/15/24 1929  [facility] Triage Screening (Walk-ins at [facility] only)  How Did You Hear About Us ? Self  What Is the Reason for Your Visit/Call Today? Pt presents to [facility] as a voluntary walk-in, unaccompanied with complaint of HI, without plan or intent and VH. Pt reports experiencing jerking like movement and talking to himself. Pt reports VH and sees an indvidual sitting with their legs folded. Pt rambles about various topics and could not stay focused on some of the questions posed. Pt reports being prescribed medication Risperdal  and Trazadone, but does not remember the last time he has taken medication. Pt was seen earlier today at [facility] ED and was discharged. Pt currently denies SI, AH and substance/alcohol use.  Have You Recently Had Any Thoughts About Hurting Yourself? No  Are You Planning to Commit Suicide/Harm Yourself At This time? No  Have you Recently Had Thoughts About Hurting Someone [name]? Yes  How long ago did you have thoughts of harming others? earlier today  Are You Planning To Harm Someone At This Time? No  Physical Abuse Denies  Verbal Abuse Denies  Sexual Abuse Denies  Exploitation of patient/patient's resources Denies  Self-Neglect Denies  Are you currently experiencing any auditory, visual or other hallucinations? Yes  Please explain the hallucinations you are currently experiencing: visual hallucination (seeing individual sitting with legs folded)  Have You Used Any Alcohol or Drugs in the Past 24 Hours? No  Do you have any current medical co-morbidities that require immediate attention? No  Clinician description of patient physical appearance/behavior: cooperative, calm, rambling about random topics at times  What Do You Feel Would Help You the Most Today? Housing Assistance;Social Support;Treatment for Depression or other mood problem  If access to [facility] was not available, would you have sought care in the Emergency Department? Yes  Determination of Need  Routine (7 days)  Options For Referral Other: Comment;Outpatient Therapy;Medication Management
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
## 2024-04-15 NOTE — ED Notes (Signed)
 Pt refused vitals due to napping  and spasm pain

## 2024-04-15 NOTE — ED Provider Notes (Signed)
 [facility] Continuous Assessment Admission H&P  Date: 04/16/24 Patient Name: Aaron Rubio MRN: [id number] Chief Complaint: suicidal ideation   Diagnoses:  Final diagnoses:  Paranoid schizophrenia (HCC)  H/O medication noncompliance  Homelessness unspecified    HPI: 4 [name], 43 y/o male with a history of paranoid schizophrenia, malingering, auditory hallucination homelessness.  Presented to [facility], voluntarily.  Per the patient I want to blow my brains out, however patient denies having access to gun.  In triage patient told me he has not taken his medicine in a while.  Patient is also currently homeless.  Patient is observed communicating with himself and I am certain himself.  Does appear to be influenced by internal stimuli.  Face-to-face observation of patient, patient is alert and oriented to person and place, maintain eye contact.  Patient when asked if he is suicidal stated I want to blow my brains out.  Patient denies HI, when asked if he is hearing voices he said yes but he could not tell what the voices were saying.  Patient also endorsed using crack cocaine and other drugs is unclear if patient is forthcoming with information.  It is also obvious patient is influenced by internal stimuli.  Patient is not a good historian and does not stay focused on topic.  Given patient prior history and current presentation.  Recommending observation unit with further assessment in the a.m.  Total Time spent with patient: 30 minutes  Musculoskeletal  Strength & Muscle Tone: within normal limits Gait & Station: normal Patient leans: N/A  Psychiatric Specialty Exam  Presentation General Appearance:  Bizarre  Eye Contact: Fair  Speech: Blocked  Speech Volume: Increased  Handedness: Right   Mood and Affect  Mood: Euthymic  Affect: Flat   Thought Process  Thought Processes: Linear  Descriptions of Associations:Intact  Orientation:Full (Time, Place and  Person)  Thought Content:Obsessions  Diagnosis of Schizophrenia or Schizoaffective disorder in past: No  Duration of Psychotic Symptoms: Greater than six months  Hallucinations:Hallucinations: Auditory Description of Auditory Hallucinations: could not say  Ideas of Reference:Paranoia  Suicidal Thoughts:Suicidal Thoughts: Yes, Passive SI Passive Intent and/or Plan: With Intent; With Means to Carry Out  Homicidal Thoughts:Homicidal Thoughts: No   Sensorium  Memory: Immediate Fair  Judgment: Poor  Insight: Lacking   Executive Functions  Concentration: Fair  Attention Span: Fair  Recall: [employer] of Knowledge: Fair  Language: Fair   Psychomotor Activity  Psychomotor Activity: Psychomotor Activity: Normal   Assets  Assets: Desire for Improvement; Resilience; Vocational/Educational   Sleep  Sleep: Sleep: Fair Number of Hours of Sleep: 4   Nutritional Assessment (For OBS and FBC admissions only) Has the patient had a weight loss or gain of 10 pounds or more in the last 3 months?: No Has the patient had a decrease in food intake/or appetite?: No Does the patient have dental problems?: No Does the patient have eating habits or behaviors that may be indicators of an eating disorder including binging or inducing vomiting?: No Has the patient recently lost weight without trying?: 0 Has the patient been eating poorly because of a decreased appetite?: 0 Malnutrition Screening Tool Score: 0    Physical Exam HENT:     Head: Normocephalic.     Nose: Nose normal.  Eyes:     Pupils: Pupils are equal, round, and reactive to light.  Cardiovascular:     Rate and Rhythm: Normal rate.  Pulmonary:     Effort: Pulmonary effort is normal.  Musculoskeletal:  General: Normal range of motion.     Cervical back: Normal range of motion.  Neurological:     General: No focal deficit present.     Mental Status: He is alert.  Psychiatric:        Mood and  Affect: Mood normal.        Behavior: Behavior normal.        Thought Content: Thought content normal.        Judgment: Judgment normal.    Review of Systems  Constitutional: Negative.   HENT: Negative.    Eyes: Negative.   Respiratory: Negative.    Cardiovascular: Negative.   Gastrointestinal: Negative.   Genitourinary: Negative.   Musculoskeletal: Negative.   Skin: Negative.   Neurological: Negative.   Psychiatric/Behavioral:  Positive for hallucinations, substance abuse and suicidal ideas. The patient is nervous/anxious.     Blood pressure (!) 133/98, pulse 73, temperature 97.8 F (36.6 C), temperature source Oral, resp. rate 20, SpO2 100%. There is no height or weight on file to calculate BMI.  Past Psychiatric History: Schizophrenia, homelessness, malingering  Is the patient at risk to self? Yes  Has the patient been a risk to self in the past 6 months? Yes .    Has the patient been a risk to self within the distant past? Yes   Is the patient a risk to others? No   Has the patient been a risk to others in the past 6 months? No   Has the patient been a risk to others within the distant past? No   Past Medical History: See chart  Family History: Unknown  Social History: Cocaine use  Last Labs:  Admission on 04/15/2024  Component Date Value Ref Range Status   Alcohol, Ethyl (B) 04/15/2024 <15  <15 mg/dL Final   Comment: (NOTE) For medical purposes only. Performed at [facility], [address], [place], [place] [address]    TSH 04/15/2024 0.945  0.350 - 4.500 uIU/mL Final   Comment: Performed by a 3rd Generation assay with a functional sensitivity of <=0.01 uIU/mL. Performed at [facility], [address] [place], [place] [address]    Sodium 04/15/2024 139  135 - 145 mmol/L Final   Potassium 04/15/2024 4.3  3.5 - 5.1 mmol/L Final   Chloride 04/15/2024 105  98 - 111 mmol/L Final   CO2 04/15/2024 26  22 - 32 mmol/L Final   Glucose, Bld 04/15/2024  89  70 - 99 mg/dL Final   Glucose reference range applies only to samples taken after fasting for at least 8 hours.   BUN 04/15/2024 20  6 - 20 mg/dL Final   Creatinine, Ser 04/15/2024 1.23  0.61 - 1.24 mg/dL Final   Calcium 89/85/7974 8.7 (L)  8.9 - 10.3 mg/dL Final   Total Protein 89/85/7974 5.1 (L)  6.5 - 8.1 g/dL Final   Albumin 89/85/7974 3.1 (L)  3.5 - 5.0 g/dL Final   AST 89/85/7974 29  15 - 41 U/L Final   ALT 04/15/2024 21  0 - 44 U/L Final   Alkaline Phosphatase 04/15/2024 55  38 - 126 U/L Final   Total Bilirubin 04/15/2024 0.3  0.0 - 1.2 mg/dL Final   GFR, Estimated 04/15/2024 >60  >60 mL/min Final   Comment: (NOTE) Calculated using the CKD-EPI Creatinine Equation (2021)    Anion gap 04/15/2024 8  5 - 15 Final   Performed at [facility], [address] [place], [place] [address]  WBC 04/15/2024 7.6  4.0 - 10.5 K/uL Final   RBC 04/15/2024 4.97  4.22 - 5.81 MIL/uL Final   Hemoglobin 04/15/2024 12.9 (L)  13.0 - 17.0 g/dL Final   HCT 89/85/7974 40.7  39.0 - 52.0 % Final   MCV 04/15/2024 81.9  80.0 - 100.0 fL Final   MCH 04/15/2024 26.0  26.0 - 34.0 pg Final   MCHC 04/15/2024 31.7  30.0 - 36.0 g/dL Final   RDW 89/85/7974 13.7  11.5 - 15.5 % Final   Platelets 04/15/2024 239  150 - 400 K/uL Final   nRBC 04/15/2024 0.0  0.0 - 0.2 % Final   Neutrophils Relative % 04/15/2024 51  % Final   Neutro Abs 04/15/2024 3.8  1.7 - 7.7 K/uL Final   Lymphocytes Relative 04/15/2024 32  % Final   Lymphs Abs 04/15/2024 2.4  0.7 - 4.0 K/uL Final   Monocytes Relative 04/15/2024 9  % Final   Monocytes Absolute 04/15/2024 0.7  0.1 - 1.0 K/uL Final   Eosinophils Relative 04/15/2024 7  % Final   Eosinophils Absolute 04/15/2024 0.6 (H)  0.0 - 0.5 K/uL Final   Basophils Relative 04/15/2024 1  % Final   Basophils Absolute 04/15/2024 0.1  0.0 - 0.1 K/uL Final   Immature Granulocytes 04/15/2024 0  % Final   Abs Immature Granulocytes 04/15/2024 0.02  0.00 - 0.07 K/uL Final   Performed at [facility], [address] [place], [place] [address]   POC Amphetamine UR 04/15/2024 None Detected  NONE DETECTED (Cut Off Level 1000 ng/mL) Final   POC Secobarbital (BAR) 04/15/2024 None Detected  NONE DETECTED (Cut Off Level 300 ng/mL) Final   POC Buprenorphine (BUP) 04/15/2024 None Detected  NONE DETECTED (Cut Off Level 10 ng/mL) Final   POC Oxazepam (BZO) 04/15/2024 None Detected (A)  NONE DETECTED (Cut Off Level 300 ng/mL) Final   POC Cocaine UR 04/15/2024 Positive (A)  NONE DETECTED (Cut Off Level 300 ng/mL) Final   POC Methamphetamine UR 04/15/2024 None Detected  NONE DETECTED (Cut Off Level 1000 ng/mL) Final   POC Morphine 04/15/2024 None Detected  NONE DETECTED (Cut Off Level 300 ng/mL) Final   POC Methadone UR 04/15/2024 None Detected  NONE DETECTED (Cut Off Level 300 ng/mL) Final   POC Oxycodone UR 04/15/2024 None Detected  NONE DETECTED (Cut Off Level 100 ng/mL) Final   POC Marijuana UR 04/15/2024 None Detected  NONE DETECTED (Cut Off Level 50 ng/mL) Final  Admission on 04/06/2024, Discharged on 04/06/2024  Component Date Value Ref Range Status   Sodium 04/06/2024 137  135 - 145 mmol/L Final   Potassium 04/06/2024 4.2  3.5 - 5.1 mmol/L Final   Chloride 04/06/2024 101  98 - 111 mmol/L Final   CO2 04/06/2024 24  22 - 32 mmol/L Final   Glucose, Bld 04/06/2024 70  70 - 99 mg/dL Final   Glucose reference range applies only to samples taken after fasting for at least 8 hours.   BUN 04/06/2024 17  6 - 20 mg/dL Final   Creatinine, Ser 04/06/2024 1.13  0.61 - 1.24 mg/dL Final   Calcium 89/94/7974 9.3  8.9 - 10.3 mg/dL Final   Total Protein 89/94/7974 6.1 (L)  6.5 - 8.1 g/dL Final   Albumin 89/94/7974 4.1  3.5 - 5.0 g/dL Final   AST 89/94/7974 29  15 - 41 U/L Final   ALT 04/06/2024 18  0 - 44 U/L Final   Alkaline Phosphatase 04/06/2024 58  38 -  126 U/L Final   Total Bilirubin 04/06/2024 0.8  0.0 - 1.2 mg/dL Final   GFR, Estimated 04/06/2024 >60  >60 mL/min Final   Comment:  (NOTE) Calculated using the CKD-EPI Creatinine Equation (2021)    Anion gap 04/06/2024 13  5 - 15 Final   Performed at [facility], [address], [place], [place] [address]   Opiates 04/06/2024 NEGATIVE  NEGATIVE Final   Cocaine 04/06/2024 POSITIVE (A)  NEGATIVE Final   Benzodiazepines 04/06/2024 NEGATIVE  NEGATIVE Final   Amphetamines 04/06/2024 NEGATIVE  NEGATIVE Final   Tetrahydrocannabinol 04/06/2024 NEGATIVE  NEGATIVE Final   Barbiturates 04/06/2024 NEGATIVE  NEGATIVE Final   Methadone Scn, Ur 04/06/2024 NEGATIVE  NEGATIVE Final   Fentanyl  04/06/2024 NEGATIVE  NEGATIVE Final   Comment: (NOTE) Drug screen is for Medical Purposes only. Positive results are preliminary only. If confirmation is needed, notify lab within 5 days.  Drug Class                 Cutoff (ng/mL) Amphetamine and metabolites 1000 Barbiturate and metabolites 200 Benzodiazepine              200 Opiates and metabolites     300 Cocaine and metabolites     300 THC                         50 Fentanyl                     5 Methadone                   300  Trazodone  is metabolized in vivo to several metabolites,  including pharmacologically active m-CPP, which is excreted in the  urine.  Immunoassay screens for amphetamines and MDMA have potential  cross-reactivity with these compounds and may provide false positive  result.  Performed at [facility], [address], [place], [place] [address]    Salicylate Lvl 04/06/2024 <7.0 (L)  7.0 - 30.0 mg/dL Final   Performed at [facility], [address], [place], [place] [address]   Acetaminophen  (Tylenol ), Serum 04/06/2024 <10 (L)  10 - 30 ug/mL Final   Comment: (NOTE) Toxic concentrations can be more effectively related to post dose interval; > 200, > 100, and > 50 ug/mL serum concentrations correspond to toxic concentrations at 4, 8, and 12 hours post dose, respectively.  Performed at [facility], [address], [place], [place] [address]    WBC 04/06/2024 10.8 (H)  4.0 - 10.5 K/uL Final   RBC 04/06/2024 5.69  4.22 - 5.81 MIL/uL Final   Hemoglobin 04/06/2024 14.3  13.0 - 17.0 g/dL Final   HCT 89/94/7974 46.6  39.0 - 52.0 % Final   MCV 04/06/2024 81.9  80.0 - 100.0 fL Final   MCH 04/06/2024 25.1 (L)  26.0 - 34.0 pg Final   MCHC 04/06/2024 30.7  30.0 - 36.0 g/dL Final   RDW 89/94/7974 13.6  11.5 - 15.5 % Final   Platelets 04/06/2024 272  150 - 400 K/uL Final   nRBC 04/06/2024 0.0  0.0 - 0.2 % Final   Neutrophils Relative % 04/06/2024 68  % Final   Neutro Abs 04/06/2024 7.4  1.7 - 7.7 K/uL Final   Lymphocytes Relative 04/06/2024 20  % Final   Lymphs Abs 04/06/2024 2.2  0.7 - 4.0 K/uL Final   Monocytes Relative 04/06/2024 9  % Final  Monocytes Absolute 04/06/2024 1.0  0.1 - 1.0 K/uL Final   Eosinophils Relative 04/06/2024 1  % Final   Eosinophils Absolute 04/06/2024 0.1  0.0 - 0.5 K/uL Final   Basophils Relative 04/06/2024 1  % Final   Basophils Absolute 04/06/2024 0.1  0.0 - 0.1 K/uL Final   Immature Granulocytes 04/06/2024 1  % Final   Abs Immature Granulocytes 04/06/2024 0.05  0.00 - 0.07 K/uL Final   Performed at [facility], [address], [place], [place] [address]   Alcohol, Ethyl (B) 04/06/2024 <15  <15 mg/dL Final   Comment: (NOTE) For medical purposes only. Performed at [facility], [address], [place], [place] [address]   Admission on 03/15/2024, Discharged on 03/16/2024  Component Date Value Ref Range Status   Sodium 03/15/2024 139  135 - 145 mmol/L Final   Potassium 03/15/2024 4.0  3.5 - 5.1 mmol/L Final   Chloride 03/15/2024 104  98 - 111 mmol/L Final   CO2 03/15/2024 23  22 - 32 mmol/L Final   Glucose, Bld 03/15/2024 83  70 - 99 mg/dL Final   Glucose reference range applies only to samples taken after fasting for at least 8 hours.   BUN 03/15/2024 14  6 - 20 mg/dL Final   Creatinine, Ser  03/15/2024 1.07  0.61 - 1.24 mg/dL Final   Calcium 90/86/7974 9.5  8.9 - 10.3 mg/dL Final   Total Protein 90/86/7974 6.3 (L)  6.5 - 8.1 g/dL Final   Albumin 90/86/7974 4.1  3.5 - 5.0 g/dL Final   AST 90/86/7974 30  15 - 41 U/L Final   HEMOLYSIS AT THIS LEVEL MAY AFFECT RESULT   ALT 03/15/2024 23  0 - 44 U/L Final   Alkaline Phosphatase 03/15/2024 58  38 - 126 U/L Final   Total Bilirubin 03/15/2024 0.5  0.0 - 1.2 mg/dL Final   GFR, Estimated 03/15/2024 >60  >60 mL/min Final   Comment: (NOTE) Calculated using the CKD-EPI Creatinine Equation (2021)    Anion gap 03/15/2024 12  5 - 15 Final   Performed at [facility], [address], [place], [place] [address]   Alcohol, Ethyl (B) 03/15/2024 <15  <15 mg/dL Final   Comment: (NOTE) For medical purposes only. Performed at [facility], [address] [place], [place] [address]    Opiates 03/15/2024 NEGATIVE  NEGATIVE Final   Cocaine 03/15/2024 POSITIVE (A)  NEGATIVE Final   Benzodiazepines 03/15/2024 NEGATIVE  NEGATIVE Final   Amphetamines 03/15/2024 NEGATIVE  NEGATIVE Final   Tetrahydrocannabinol 03/15/2024 POSITIVE (A)  NEGATIVE Final   Barbiturates 03/15/2024 NEGATIVE  NEGATIVE Final   Methadone Scn, Ur 03/15/2024 NEGATIVE  NEGATIVE Final   Fentanyl  03/15/2024 NEGATIVE  NEGATIVE Final   Comment: (NOTE) Drug screen is for Medical Purposes only. Positive results are preliminary only. If confirmation is needed, notify lab within 5 days.  Drug Class                 Cutoff (ng/mL) Amphetamine and metabolites 1000 Barbiturate and metabolites 200 Benzodiazepine              200 Opiates and metabolites     300 Cocaine and metabolites     300 THC                         50 Fentanyl   5 Methadone                   300  Trazodone  is metabolized in vivo to several metabolites,  including pharmacologically active m-CPP, which is excreted in the  urine.  Immunoassay screens for  amphetamines and MDMA have potential  cross-reactivity with these compounds and may provide false positive  result.  Performed at [facility], [address] [place], [place] [address]    WBC 03/15/2024 7.4  4.0 - 10.5 K/uL Final   RBC 03/15/2024 5.15  4.22 - 5.81 MIL/uL Final   Hemoglobin 03/15/2024 13.2  13.0 - 17.0 g/dL Final   HCT 90/86/7974 43.9  39.0 - 52.0 % Final   MCV 03/15/2024 85.2  80.0 - 100.0 fL Final   MCH 03/15/2024 25.6 (L)  26.0 - 34.0 pg Final   MCHC 03/15/2024 30.1  30.0 - 36.0 g/dL Final   RDW 90/86/7974 13.8  11.5 - 15.5 % Final   Platelets 03/15/2024 228  150 - 400 K/uL Final   nRBC 03/15/2024 0.0  0.0 - 0.2 % Final   Performed at [facility], [address] [place], [place] [address]  Admission on 03/14/2024, Discharged on 03/15/2024  Component Date Value Ref Range Status   Sodium 03/15/2024 141  135 - 145 mmol/L Final   Potassium 03/15/2024 4.1  3.5 - 5.1 mmol/L Final   Chloride 03/15/2024 104  98 - 111 mmol/L Final   BUN 03/15/2024 12  6 - 20 mg/dL Final   Creatinine, Ser 03/15/2024 1.10  0.61 - 1.24 mg/dL Final   Glucose, Bld 90/86/7974 93  70 - 99 mg/dL Final   Glucose reference range applies only to samples taken after fasting for at least 8 hours.   Calcium, Ion 03/15/2024 1.19  1.15 - 1.40 mmol/L Final   TCO2 03/15/2024 26  22 - 32 mmol/L Final   Hemoglobin 03/15/2024 13.9  13.0 - 17.0 g/dL Final   HCT 90/86/7974 41.0  39.0 - 52.0 % Final  Admission on 03/06/2024, Discharged on 03/07/2024  Component Date Value Ref Range Status   Sodium 03/06/2024 140  135 - 145 mmol/L Final   Potassium 03/06/2024 3.7  3.5 - 5.1 mmol/L Final   Chloride 03/06/2024 104  98 - 111 mmol/L Final   CO2 03/06/2024 28  22 - 32 mmol/L Final   Glucose, Bld 03/06/2024 99  70 - 99 mg/dL Final   Glucose reference range applies only to samples taken after fasting for at least 8 hours.   BUN 03/06/2024 9  6 - 20 mg/dL Final   Creatinine,  Ser 03/06/2024 1.02  0.61 - 1.24 mg/dL Final   Calcium 90/95/7974 9.0  8.9 - 10.3 mg/dL Final   Total Protein 90/95/7974 5.9 (L)  6.5 - 8.1 g/dL Final   Albumin 90/95/7974 3.5  3.5 - 5.0 g/dL Final   AST 90/95/7974 28  15 - 41 U/L Final   ALT 03/06/2024 19  0 - 44 U/L Final   Alkaline Phosphatase 03/06/2024 57  38 - 126 U/L Final   Total Bilirubin 03/06/2024 0.4  0.0 - 1.2 mg/dL Final   GFR, Estimated 03/06/2024 >60  >60 mL/min Final   Comment: (NOTE) Calculated using the CKD-EPI Creatinine Equation (2021)    Anion gap 03/06/2024 8  5 - 15 Final   Performed at [facility], [address] [place], [place] [address]   Alcohol, Ethyl (B) 03/06/2024 <15  <15 mg/dL Final  Comment: (NOTE) For medical purposes only. Performed at [facility], [address], [place], [place] [address]    WBC 03/06/2024 7.2  4.0 - 10.5 K/uL Final   RBC 03/06/2024 5.41  4.22 - 5.81 MIL/uL Final   Hemoglobin 03/06/2024 14.1  13.0 - 17.0 g/dL Final   HCT 90/95/7974 45.6  39.0 - 52.0 % Final   MCV 03/06/2024 84.3  80.0 - 100.0 fL Final   MCH 03/06/2024 26.1  26.0 - 34.0 pg Final   MCHC 03/06/2024 30.9  30.0 - 36.0 g/dL Final   RDW 90/95/7974 13.9  11.5 - 15.5 % Final   Platelets 03/06/2024 284  150 - 400 K/uL Final   nRBC 03/06/2024 0.0  0.0 - 0.2 % Final   Performed at [facility], [address], [place], [place] [address]  Admission on 02/02/2024, Discharged on 02/03/2024  Component Date Value Ref Range Status   Sodium 02/02/2024 140  135 - 145 mmol/L Final   Potassium 02/02/2024 3.7  3.5 - 5.1 mmol/L Final   Chloride 02/02/2024 108  98 - 111 mmol/L Final   CO2 02/02/2024 23  22 - 32 mmol/L Final   Glucose, Bld 02/02/2024 131 (H)  70 - 99 mg/dL Final   Glucose reference range applies only to samples taken after fasting for at least 8 hours.   BUN 02/02/2024 18  6 - 20 mg/dL Final   Creatinine, Ser 02/02/2024 1.37 (H)  0.61 - 1.24 mg/dL Final   Calcium 91/97/7974 8.8 (L)  8.9 -  10.3 mg/dL Final   Total Protein 91/97/7974 6.1 (L)  6.5 - 8.1 g/dL Final   Albumin 91/97/7974 3.5  3.5 - 5.0 g/dL Final   AST 91/97/7974 38  15 - 41 U/L Final   ALT 02/02/2024 28  0 - 44 U/L Final   Alkaline Phosphatase 02/02/2024 83  38 - 126 U/L Final   Total Bilirubin 02/02/2024 0.6  0.0 - 1.2 mg/dL Final   GFR, Estimated 02/02/2024 >60  >60 mL/min Final   Comment: (NOTE) Calculated using the CKD-EPI Creatinine Equation (2021)    Anion gap 02/02/2024 9  5 - 15 Final   Performed at [facility], [address], [place], [place] [address]   Alcohol, Ethyl (B) 02/02/2024 <15  <15 mg/dL Final   Comment: (NOTE) For medical purposes only. Performed at [facility], [address] [place], [place] [address]    WBC 02/02/2024 6.8  4.0 - 10.5 K/uL Final   RBC 02/02/2024 4.64  4.22 - 5.81 MIL/uL Final   Hemoglobin 02/02/2024 12.3 (L)  13.0 - 17.0 g/dL Final   HCT 91/97/7974 39.1  39.0 - 52.0 % Final   MCV 02/02/2024 84.3  80.0 - 100.0 fL Final   MCH 02/02/2024 26.5  26.0 - 34.0 pg Final   MCHC 02/02/2024 31.5  30.0 - 36.0 g/dL Final   RDW 91/97/7974 14.1  11.5 - 15.5 % Final   Platelets 02/02/2024 230  150 - 400 K/uL Final   nRBC 02/02/2024 0.0  0.0 - 0.2 % Final   Performed at [facility], [address], [place], [place] [address]   Opiates 02/03/2024 NONE DETECTED  NONE DETECTED Final   Cocaine 02/03/2024 POSITIVE (A)  NONE DETECTED Final   Benzodiazepines 02/03/2024 NONE DETECTED  NONE DETECTED Final   Amphetamines 02/03/2024 NONE DETECTED  NONE DETECTED Final   Tetrahydrocannabinol 02/03/2024 POSITIVE (A)  NONE DETECTED Final   Barbiturates  02/03/2024 NONE DETECTED  NONE DETECTED Final   Comment: (NOTE) DRUG SCREEN FOR MEDICAL PURPOSES ONLY.  IF CONFIRMATION IS NEEDED FOR ANY PURPOSE, NOTIFY LAB WITHIN 5 DAYS.  LOWEST DETECTABLE LIMITS FOR URINE DRUG SCREEN Drug Class                     Cutoff (ng/mL) Amphetamine  and metabolites    1000 Barbiturate and metabolites    200 Benzodiazepine                 200 Opiates and metabolites        300 Cocaine and metabolites        300 THC                            50 Performed at [facility], [address], [place], [place] [address]   Admission on 01/14/2024, Discharged on 01/17/2024  Component Date Value Ref Range Status   Sodium 01/14/2024 134 (L)  135 - 145 mmol/L Final   Potassium 01/14/2024 3.1 (L)  3.5 - 5.1 mmol/L Final   Chloride 01/14/2024 99  98 - 111 mmol/L Final   CO2 01/14/2024 25  22 - 32 mmol/L Final   Glucose, Bld 01/14/2024 169 (H)  70 - 99 mg/dL Final   Glucose reference range applies only to samples taken after fasting for at least 8 hours.   BUN 01/14/2024 13  6 - 20 mg/dL Final   Creatinine, Ser 01/14/2024 1.39 (H)  0.61 - 1.24 mg/dL Final   Calcium 92/85/7974 9.2  8.9 - 10.3 mg/dL Final   Total Protein 92/85/7974 7.0  6.5 - 8.1 g/dL Final   Albumin 92/85/7974 3.9  3.5 - 5.0 g/dL Final   AST 92/85/7974 52 (H)  15 - 41 U/L Final   ALT 01/14/2024 36  0 - 44 U/L Final   Alkaline Phosphatase 01/14/2024 66  38 - 126 U/L Final   Total Bilirubin 01/14/2024 0.8  0.0 - 1.2 mg/dL Final   GFR, Estimated 01/14/2024 >60  >60 mL/min Final   Comment: (NOTE) Calculated using the CKD-EPI Creatinine Equation (2021)    Anion gap 01/14/2024 10  5 - 15 Final   Performed at [facility], [address] [place], [place] [address]   Alcohol, Ethyl (B) 01/14/2024 <15  <15 mg/dL Final   Comment: (NOTE) For medical purposes only. Performed at [facility], [address], [place], [place] [address]    Opiates 01/14/2024 NONE DETECTED  NONE DETECTED Final   Cocaine 01/14/2024 POSITIVE (A)  NONE DETECTED Final   Benzodiazepines 01/14/2024 NONE DETECTED  NONE DETECTED Final   Amphetamines 01/14/2024 NONE DETECTED  NONE DETECTED Final   Tetrahydrocannabinol 01/14/2024 NONE DETECTED  NONE  DETECTED Final   Barbiturates 01/14/2024 NONE DETECTED  NONE DETECTED Final   Comment: (NOTE) DRUG SCREEN FOR MEDICAL PURPOSES ONLY.  IF CONFIRMATION IS NEEDED FOR ANY PURPOSE, NOTIFY LAB WITHIN 5 DAYS.  LOWEST DETECTABLE LIMITS FOR URINE DRUG SCREEN Drug Class                     Cutoff (ng/mL) Amphetamine and metabolites    1000 Barbiturate and metabolites    200 Benzodiazepine                 200 Opiates and metabolites        300 Cocaine and metabolites  300 THC                            50 Performed at [facility], [address], [place], [place] [address]    WBC 01/14/2024 9.2  4.0 - 10.5 K/uL Final   RBC 01/14/2024 5.18  4.22 - 5.81 MIL/uL Final   Hemoglobin 01/14/2024 13.7  13.0 - 17.0 g/dL Final   HCT 92/85/7974 43.4  39.0 - 52.0 % Final   MCV 01/14/2024 83.8  80.0 - 100.0 fL Final   MCH 01/14/2024 26.4  26.0 - 34.0 pg Final   MCHC 01/14/2024 31.6  30.0 - 36.0 g/dL Final   RDW 92/85/7974 13.5  11.5 - 15.5 % Final   Platelets 01/14/2024 274  150 - 400 K/uL Final   nRBC 01/14/2024 0.0  0.0 - 0.2 % Final   Neutrophils Relative % 01/14/2024 56  % Final   Neutro Abs 01/14/2024 5.1  1.7 - 7.7 K/uL Final   Lymphocytes Relative 01/14/2024 24  % Final   Lymphs Abs 01/14/2024 2.2  0.7 - 4.0 K/uL Final   Monocytes Relative 01/14/2024 9  % Final   Monocytes Absolute 01/14/2024 0.8  0.1 - 1.0 K/uL Final   Eosinophils Relative 01/14/2024 10  % Final   Eosinophils Absolute 01/14/2024 1.0 (H)  0.0 - 0.5 K/uL Final   Basophils Relative 01/14/2024 1  % Final   Basophils Absolute 01/14/2024 0.1  0.0 - 0.1 K/uL Final   Immature Granulocytes 01/14/2024 0  % Final   Abs Immature Granulocytes 01/14/2024 0.03  0.00 - 0.07 K/uL Final   Performed at [facility], [address], [place], [place] [address]   Acetaminophen  (Tylenol ), Serum 01/14/2024 12  10 - 30 ug/mL Final   Comment: (NOTE) Therapeutic concentrations vary significantly. A  range of 10-30 ug/mL  may be an effective concentration for many patients. However, some  are best treated at concentrations outside of this range. Acetaminophen  concentrations >150 ug/mL at 4 hours after ingestion  and >50 ug/mL at 12 hours after ingestion are often associated with  toxic reactions.  Performed at [facility], [address] [place], [place] [address]    Salicylate Lvl 01/14/2024 <7.0 (L)  7.0 - 30.0 mg/dL Final   Performed at [facility], [address], [place], [place] [address]  Admission on 01/10/2024, Discharged on 01/11/2024  Component Date Value Ref Range Status   Sodium 01/10/2024 134 (L)  135 - 145 mmol/L Final   Potassium 01/10/2024 3.6  3.5 - 5.1 mmol/L Final   Chloride 01/10/2024 101  98 - 111 mmol/L Final   CO2 01/10/2024 22  22 - 32 mmol/L Final   Glucose, Bld 01/10/2024 103 (H)  70 - 99 mg/dL Final   Glucose reference range applies only to samples taken after fasting for at least 8 hours.   BUN 01/10/2024 20  6 - 20 mg/dL Final   Creatinine, Ser 01/10/2024 1.27 (H)  0.61 - 1.24 mg/dL Final   Calcium 92/89/7974 9.1  8.9 - 10.3 mg/dL Final   Total Protein 92/89/7974 6.6  6.5 - 8.1 g/dL Final   Albumin 92/89/7974 4.0  3.5 - 5.0 g/dL Final   AST 92/89/7974 84 (H)  15 - 41 U/L Final   ALT 01/10/2024 43  0 - 44 U/L Final   Alkaline Phosphatase 01/10/2024 79  38 - 126 U/L Final   Total Bilirubin  01/10/2024 0.6  0.0 - 1.2 mg/dL Final   GFR, Estimated 01/10/2024 >60  >60 mL/min Final   Comment: (NOTE) Calculated using the CKD-EPI Creatinine Equation (2021)    Anion gap 01/10/2024 11  5 - 15 Final   Performed at [facility], [address], [place], [place] [address]   Alcohol, Ethyl (B) 01/10/2024 <15  <15 mg/dL Final   Comment: (NOTE) For medical purposes only. Performed at [facility], [address], [place], [place] [address]    WBC 01/10/2024 9.4  4.0 - 10.5 K/uL Final   RBC 01/10/2024 5.06   4.22 - 5.81 MIL/uL Final   Hemoglobin 01/10/2024 13.3  13.0 - 17.0 g/dL Final   HCT 92/89/7974 42.4  39.0 - 52.0 % Final   MCV 01/10/2024 83.8  80.0 - 100.0 fL Final   MCH 01/10/2024 26.3  26.0 - 34.0 pg Final   MCHC 01/10/2024 31.4  30.0 - 36.0 g/dL Final   RDW 92/89/7974 13.2  11.5 - 15.5 % Final   Platelets 01/10/2024 280  150 - 400 K/uL Final   nRBC 01/10/2024 0.0  0.0 - 0.2 % Final   Performed at [facility], [address], [place], [place] [address]   Opiates 01/10/2024 NONE DETECTED  NONE DETECTED Final   Cocaine 01/10/2024 POSITIVE (A)  NONE DETECTED Final   Benzodiazepines 01/10/2024 NONE DETECTED  NONE DETECTED Final   Amphetamines 01/10/2024 NONE DETECTED  NONE DETECTED Final   Tetrahydrocannabinol 01/10/2024 POSITIVE (A)  NONE DETECTED Final   Barbiturates 01/10/2024 NONE DETECTED  NONE DETECTED Final   Comment: (NOTE) DRUG SCREEN FOR MEDICAL PURPOSES ONLY.  IF CONFIRMATION IS NEEDED FOR ANY PURPOSE, NOTIFY LAB WITHIN 5 DAYS.  LOWEST DETECTABLE LIMITS FOR URINE DRUG SCREEN Drug Class                     Cutoff (ng/mL) Amphetamine and metabolites    1000 Barbiturate and metabolites    200 Benzodiazepine                 200 Opiates and metabolites        300 Cocaine and metabolites        300 THC                            50 Performed at [facility], [address] [place], [place] [address]   Admission on 01/09/2024, Discharged on 01/10/2024  Component Date Value Ref Range Status   Sodium 01/09/2024 134 (L)  135 - 145 mmol/L Final   Potassium 01/09/2024 3.2 (L)  3.5 - 5.1 mmol/L Final   Chloride 01/09/2024 97 (L)  98 - 111 mmol/L Final   CO2 01/09/2024 25  22 - 32 mmol/L Final   Glucose, Bld 01/09/2024 110 (H)  70 - 99 mg/dL Final   Glucose reference range applies only to samples taken after fasting for at least 8 hours.   BUN 01/09/2024 24 (H)  6 - 20 mg/dL Final   Creatinine, Ser 01/09/2024 1.38 (H)  0.61 - 1.24 mg/dL Final   Calcium  92/90/7974 9.4  8.9 - 10.3 mg/dL Final   Total Protein 92/90/7974 7.8  6.5 - 8.1 g/dL Final   Albumin 92/90/7974 4.6  3.5 - 5.0 g/dL Final   AST 92/90/7974 122 (H)  15 - 41 U/L Final   ALT 01/09/2024 49 (H)  0 - 44  U/L Final   Alkaline Phosphatase 01/09/2024 65  38 - 126 U/L Final   Total Bilirubin 01/09/2024 1.1  0.0 - 1.2 mg/dL Final   GFR, Estimated 01/09/2024 >60  >60 mL/min Final   Comment: (NOTE) Calculated using the CKD-EPI Creatinine Equation (2021)    Anion gap 01/09/2024 12  5 - 15 Final   Performed at [facility], [address], [place], [place] [address]   Alcohol, Ethyl (B) 01/09/2024 <15  <15 mg/dL Final   Comment: (NOTE) For medical purposes only. Performed at [facility], [address], [place], [place] [address]    WBC 01/09/2024 12.7 (H)  4.0 - 10.5 K/uL Final   RBC 01/09/2024 5.26  4.22 - 5.81 MIL/uL Final   Hemoglobin 01/09/2024 13.9  13.0 - 17.0 g/dL Final   HCT 92/90/7974 42.9  39.0 - 52.0 % Final   MCV 01/09/2024 81.6  80.0 - 100.0 fL Final   MCH 01/09/2024 26.4  26.0 - 34.0 pg Final   MCHC 01/09/2024 32.4  30.0 - 36.0 g/dL Final   RDW 92/90/7974 13.2  11.5 - 15.5 % Final   Platelets 01/09/2024 278  150 - 400 K/uL Final   nRBC 01/09/2024 0.0  0.0 - 0.2 % Final   Performed at [facility], [address], [place], [place] [address]  Admission on 01/05/2024, Discharged on 01/07/2024  Component Date Value Ref Range Status   Sodium 01/05/2024 136  135 - 145 mmol/L Final   Potassium 01/05/2024 3.5  3.5 - 5.1 mmol/L Final   Chloride 01/05/2024 100  98 - 111 mmol/L Final   CO2 01/05/2024 21 (L)  22 - 32 mmol/L Final   Glucose, Bld 01/05/2024 118 (H)  70 - 99 mg/dL Final   Glucose reference range applies only to samples taken after fasting for at least 8 hours.   BUN 01/05/2024 28 (H)  6 - 20 mg/dL Final   Creatinine, Ser 01/05/2024 1.28 (H)  0.61 - 1.24 mg/dL Final   Calcium 92/94/7974 9.9  8.9 - 10.3  mg/dL Final   Total Protein 92/94/7974 8.0  6.5 - 8.1 g/dL Final   Albumin 92/94/7974 4.6  3.5 - 5.0 g/dL Final   AST 92/94/7974 68 (H)  15 - 41 U/L Final   ALT 01/05/2024 35  0 - 44 U/L Final   Alkaline Phosphatase 01/05/2024 66  38 - 126 U/L Final   Total Bilirubin 01/05/2024 1.3 (H)  0.0 - 1.2 mg/dL Final   GFR, Estimated 01/05/2024 >60  >60 mL/min Final   Comment: (NOTE) Calculated using the CKD-EPI Creatinine Equation (2021)    Anion gap 01/05/2024 15  5 - 15 Final   Performed at [facility], [address] [place], [place] [address]   Alcohol, Ethyl (B) 01/05/2024 <15  <15 mg/dL Final   Comment: (NOTE) For medical purposes only. Performed at [facility], [address] [place], [place] [address]    WBC 01/05/2024 14.5 (H)  4.0 - 10.5 K/uL Final   RBC 01/05/2024 5.46  4.22 - 5.81 MIL/uL Final   Hemoglobin 01/05/2024 14.2  13.0 - 17.0 g/dL Final   HCT 92/94/7974 44.9  39.0 - 52.0 % Final   MCV 01/05/2024 82.2  80.0 - 100.0 fL Final   MCH 01/05/2024 26.0  26.0 - 34.0 pg Final   MCHC 01/05/2024 31.6  30.0 - 36.0 g/dL Final   RDW 92/94/7974 13.5  11.5 - 15.5 % Final  Platelets 01/05/2024 291  150 - 400 K/uL Final   nRBC 01/05/2024 0.0  0.0 - 0.2 % Final   Performed at [facility], [address], [place], [place] [address]   Opiates 01/05/2024 NONE DETECTED  NONE DETECTED Final   Cocaine 01/05/2024 POSITIVE (A)  NONE DETECTED Final   Benzodiazepines 01/05/2024 NONE DETECTED  NONE DETECTED Final   Amphetamines 01/05/2024 NONE DETECTED  NONE DETECTED Final   Tetrahydrocannabinol 01/05/2024 NONE DETECTED  NONE DETECTED Final   Barbiturates 01/05/2024 NONE DETECTED  NONE DETECTED Final   Comment: (NOTE) DRUG SCREEN FOR MEDICAL PURPOSES ONLY.  IF CONFIRMATION IS NEEDED FOR ANY PURPOSE, NOTIFY LAB WITHIN 5 DAYS.  LOWEST DETECTABLE LIMITS FOR URINE DRUG SCREEN Drug Class                     Cutoff  (ng/mL) Amphetamine and metabolites    1000 Barbiturate and metabolites    200 Benzodiazepine                 200 Opiates and metabolites        300 Cocaine and metabolites        300 THC                            50 Performed at [facility], [address] [place], [place] [address]   There may be more visits with results that are not included.    Allergies: Patient has no known allergies.  Medications:  Facility Ordered Medications  Medication   [COMPLETED] acetaminophen  (TYLENOL ) tablet 325 mg   [COMPLETED] cyclobenzaprine  (FLEXERIL ) tablet 5 mg   acetaminophen  (TYLENOL ) tablet 650 mg   alum & mag hydroxide-simeth (MAALOX/MYLANTA) 200-200-20 MG/5ML suspension 30 mL   magnesium  hydroxide (MILK OF MAGNESIA) suspension 30 mL   haloperidol  (HALDOL ) tablet 5 mg   And   diphenhydrAMINE  (BENADRYL ) capsule 50 mg   haloperidol  lactate (HALDOL ) injection 5 mg   And   diphenhydrAMINE  (BENADRYL ) injection 50 mg   And   LORazepam  (ATIVAN ) injection 2 mg   haloperidol  lactate (HALDOL ) injection 10 mg   And   diphenhydrAMINE  (BENADRYL ) injection 50 mg   And   LORazepam  (ATIVAN ) injection 2 mg   [COMPLETED] OLANZapine  (ZYPREXA ) tablet 7.5 mg   [COMPLETED] diphenhydrAMINE  (BENADRYL ) capsule 50 mg   PTA Medications  Medication Sig   risperiDONE  (RISPERDAL ) 1 MG tablet Take 1 tablet (1 mg total) by mouth 2 (two) times daily.   lidocaine  (LIDODERM ) 5 % Place 1 patch onto the skin daily. Remove & Discard patch within 12 hours or as directed by MD      Medical Decision Making  Observation unit    Recommendations  Based on my evaluation the patient does not appear to have an emergency medical condition.  Gaither Pouch, NP 04/16/24  4:20 AM

## 2024-04-15 NOTE — ED Notes (Signed)
 Patient discharged by RN. Patient given food and drink as well as bus pass. Patient verbalizes understanding of instructions. Vitals obtained. MD notified and aware. Per MD, patient is stable to go to bus at this time.

## 2024-04-15 NOTE — ED Provider Notes (Signed)
 [place] EMERGENCY DEPARTMENT AT [facility] Provider Note   CSN: [id number] Arrival date & time: 04/14/24  2246     Patient presents with: Spasms   Aaron Rubio is a 43 y.o. male.   43 year old male history of frequent ED visits and homelessness who presents to the emergency department due to twitching.  Patient reports that for years she has had twitching.  No loss of consciousness during it.  Happens when he sits and concentrates more.  Does not want to elaborate.  Says that he is frustrated about being homeless and having to walk around.  Says he is having some cramps through the walking and is requesting Tylenol  and food.       Prior to Admission medications   Medication Sig Start Date End Date Taking? Authorizing Provider  lidocaine  (LIDODERM ) 5 % Place 1 patch onto the skin daily. Remove & Discard patch within 12 hours or as directed by MD 04/13/24   Nettie, April, MD  risperiDONE  (RISPERDAL ) 1 MG tablet Take 1 tablet (1 mg total) by mouth 2 (two) times daily. 04/11/24   Leigh Corean Massa, MD    Allergies: Patient has no known allergies.    Review of Systems  Updated Vital Signs BP (!) 127/95 (BP Location: Right Arm)   Pulse 82   Temp 97.7 F (36.5 C)   Resp 20   Ht 6' 2 (1.88 m)   Wt 97 kg   SpO2 100%   BMI 27.46 kg/m   Physical Exam Constitutional:      Appearance: Normal appearance.  HENT:     Head: Normocephalic and atraumatic.  Neurological:     General: No focal deficit present.     Mental Status: He is alert and oriented to person, place, and time. Mental status is at baseline.     Cranial Nerves: No cranial nerve deficit.     Sensory: No sensory deficit.     Motor: No weakness.     Gait: Gait normal.     (all labs ordered are listed, but only abnormal results are displayed) Labs Reviewed - No data to display  EKG: None  Radiology: No results found.   Procedures   Medications Ordered in the ED  acetaminophen   (TYLENOL ) tablet 325 mg (has no administration in time range)  cyclobenzaprine  (FLEXERIL ) tablet 5 mg (has no administration in time range)                                    Medical Decision Making Risk OTC drugs. Prescription drug management.   43 year old male history of homelessness and frequent ED visits who presents emergency department due to twitching  Initial Ddx:  Tremor, seizures, electrolyte abnormality, muscle spasms  MDM/Course:  Patient presents to the emergency department due to twitching this been going on for years.  Does not appear to be consistent with seizures.  Has had 10 visits this month already on 04/06/2024 had normal labs so low concern for electrolyte abnormality.  Suspect that the primary mode of for his visit today may be due to housing and food concerns.  Given Tylenol  and a muscle relaxer at his request.   This patient presents to the ED for concern of complaints listed in HPI, this involves an extensive number of treatment options, and is a complaint that carries with it a high risk of complications and morbidity. Disposition including potential need for  admission considered.   Dispo: DC Home. Return precautions discussed including, but not limited to, those listed in the AVS. Allowed pt time to ask questions which were answered fully prior to dc.  Records reviewed ED Visit Notes I have reviewed the patients home medications and made adjustments as needed Social Determinants of health:  Homelessness  Portions of this note were generated with Scientist, clinical (histocompatibility and immunogenetics). Dictation errors may occur despite best attempts at proofreading.     Final diagnoses:  Muscle spasm    ED Discharge Orders     None          Yolande Lamar BROCKS, MD 04/15/24 [id number]

## 2024-04-15 NOTE — ED Notes (Signed)
MD notified of BP

## 2024-04-15 NOTE — Discharge Instructions (Signed)
 You were seen for your spasms in the emergency department.   At home, please stay well-hydrated.    Check your MyChart online for the results of any tests that had not resulted by the time you left the emergency department.   Follow-up with your primary doctor in 2-3 days regarding your visit.  Follow-up with neurology if they continue to occur  Return immediately to the emergency department if you experience any of the following: Worsening spasms, loss of consciousness, or any other concerning symptoms.    Thank you for visiting our Emergency Department. It was a pleasure taking care of you today.

## 2024-04-16 ENCOUNTER — Emergency Department (HOSPITAL_COMMUNITY)
Admission: EM | Admit: 2024-04-16 | Discharge: 2024-04-16 | Disposition: A | Discharge status: Home / Self Care | Attending: Emergency Medicine | Admitting: Emergency Medicine

## 2024-04-16 ENCOUNTER — Encounter (HOSPITAL_COMMUNITY): Payer: Self-pay | Admitting: *Deleted

## 2024-04-16 ENCOUNTER — Emergency Department (HOSPITAL_COMMUNITY)
Admission: EM | Admit: 2024-04-16 | Discharge: 2024-04-16 | Disposition: A | Discharge status: Home / Self Care | Source: Home / Self Care | Attending: Emergency Medicine | Admitting: Emergency Medicine

## 2024-04-16 ENCOUNTER — Encounter (HOSPITAL_COMMUNITY): Payer: Self-pay

## 2024-04-16 ENCOUNTER — Other Ambulatory Visit: Payer: Self-pay

## 2024-04-16 ENCOUNTER — Other Ambulatory Visit (HOSPITAL_COMMUNITY): Payer: Self-pay

## 2024-04-16 ENCOUNTER — Emergency Department (HOSPITAL_COMMUNITY)
Admission: EM | Admit: 2024-04-16 | Discharge: 2024-04-17 | Disposition: A | Discharge status: Home / Self Care | Source: Home / Self Care | Attending: Emergency Medicine | Admitting: Emergency Medicine

## 2024-04-16 DIAGNOSIS — R0602 Shortness of breath: Secondary | ICD-10-CM | POA: Insufficient documentation

## 2024-04-16 DIAGNOSIS — Z5900 Homelessness unspecified: Secondary | ICD-10-CM | POA: Insufficient documentation

## 2024-04-16 DIAGNOSIS — I10 Essential (primary) hypertension: Secondary | ICD-10-CM | POA: Insufficient documentation

## 2024-04-16 DIAGNOSIS — K130 Diseases of lips: Secondary | ICD-10-CM

## 2024-04-16 DIAGNOSIS — M79605 Pain in left leg: Secondary | ICD-10-CM | POA: Insufficient documentation

## 2024-04-16 DIAGNOSIS — R197 Diarrhea, unspecified: Secondary | ICD-10-CM | POA: Diagnosis present

## 2024-04-16 DIAGNOSIS — L853 Xerosis cutis: Secondary | ICD-10-CM | POA: Insufficient documentation

## 2024-04-16 DIAGNOSIS — R519 Headache, unspecified: Secondary | ICD-10-CM | POA: Insufficient documentation

## 2024-04-16 DIAGNOSIS — F200 Paranoid schizophrenia: Secondary | ICD-10-CM | POA: Diagnosis not present

## 2024-04-16 DIAGNOSIS — L988 Other specified disorders of the skin and subcutaneous tissue: Secondary | ICD-10-CM | POA: Insufficient documentation

## 2024-04-16 DIAGNOSIS — M79604 Pain in right leg: Secondary | ICD-10-CM | POA: Insufficient documentation

## 2024-04-16 DIAGNOSIS — Z91148 Patient's other noncompliance with medication regimen for other reason: Secondary | ICD-10-CM | POA: Diagnosis not present

## 2024-04-16 LAB — TSH: TSH: 0.945 u[IU]/mL (ref 0.350–4.500)

## 2024-04-16 MED ORDER — ACETAMINOPHEN 325 MG PO TABS
650.0000 mg | ORAL_TABLET | Freq: Once | ORAL | Status: AC
  Administered 2024-04-16: 650 mg via ORAL
  Filled 2024-04-16: qty 2

## 2024-04-16 NOTE — ED Provider Notes (Signed)
 FBC/OBS ASAP Discharge Summary  Date and Time: 04/16/2024 10:00 AM  Name: Aaron Rubio  MRN:  [id number]   Discharge Diagnoses:  Final diagnoses:  Paranoid schizophrenia (HCC)  H/O medication noncompliance  Homelessness unspecified    Subjective: I am homeless.Aaron Rubio, 43 y/o male with a history of paranoid schizophrenia, malingering, auditory hallucination homelessness.  Presented to [facility], voluntarily.  Per the patient I want to blow my brains out, however patient denied having access to gun.  In triage patient reported  he has not taken his medicine in a while.  Patient is also currently homeless.  Patient was observed communicating with himself and  appeared to be influenced by internal stimuli. He reported using crack/cocaine as well as other drugs.    Stay Summary: Patient was evaluated by the attending provider who recommended overnight observation and reassessment in AM. Labs and medications  ordered. Nursing notes indicate that patient slept well throughout the night.  Per NT note, Patient got upset because he could not watch what he wanted on tv and he wanted the remote, explained he could not have it, and can only watch PG-13, he used profanity said to turn it off, I checked with the other patients no one else wanted to watch tv, turned it off.   Patient is evaluated face-to-face by this provider today 04/16/2024.  He is seen is his bed awake. Casually dressed with decent hygiene. He is guarded and commending: states he is here because of this country. States he is homeless, has no phone, no food  and needs to get back in school. He needs clothes and toiletries. States you all need to help me with that. He denies SI/HI/AVH. States he does not need medications but a place to stay at.  Patient does not appear to be in any severe distress. I discussed with him about available services in the community and encouraged him to follow up with his outpatient provider  [facility]).  Additional resources provided.  Patient was discharged with no sign of distress.   Total Time spent with patient: 30 minutes  Past Psychiatric History: Schizophrenia Past Medical History: NA Family History: NA Family Psychiatric History: NA Social History: Homeless, unemployed Tobacco Cessation:  Prescription not provided because: Patient refused  Current Medications:  Current Facility-Administered Medications  Medication Dose Route Frequency Provider Last Rate Last Admin   acetaminophen  (TYLENOL ) tablet 650 mg  650 mg Oral Q6H PRN Trudy Carwin, NP   650 mg at 04/15/24 2259   alum & mag hydroxide-simeth (MAALOX/MYLANTA) 200-200-20 MG/5ML suspension 30 mL  30 mL Oral Q4H PRN Trudy Carwin, NP       haloperidol  (HALDOL ) tablet 5 mg  5 mg Oral TID PRN Trudy Carwin, NP       And   diphenhydrAMINE  (BENADRYL ) capsule 50 mg  50 mg Oral TID PRN Trudy Carwin, NP       haloperidol  lactate (HALDOL ) injection 5 mg  5 mg Intramuscular TID PRN Trudy Carwin, NP       And   diphenhydrAMINE  (BENADRYL ) injection 50 mg  50 mg Intramuscular TID PRN Trudy Carwin, NP       And   LORazepam  (ATIVAN ) injection 2 mg  2 mg Intramuscular TID PRN Trudy Carwin, NP       haloperidol  lactate (HALDOL ) injection 10 mg  10 mg Intramuscular TID PRN Trudy Carwin, NP       And   diphenhydrAMINE  (BENADRYL ) injection 50 mg  50 mg Intramuscular TID  PRN Trudy Carwin, NP       And   LORazepam  (ATIVAN ) injection 2 mg  2 mg Intramuscular TID PRN Trudy Carwin, NP       magnesium  hydroxide (MILK OF MAGNESIA) suspension 30 mL  30 mL Oral Daily PRN Trudy Carwin, NP       Current Outpatient Medications  Medication Sig Dispense Refill   lidocaine  (LIDODERM ) 5 % Place 1 patch onto the skin daily. Remove & Discard patch within 12 hours or as directed by MD 10 patch 0   risperiDONE  (RISPERDAL ) 1 MG tablet Take 1 tablet (1 mg total) by mouth 2 (two) times daily. 60 tablet 0    PTA Medications:  Facility Ordered  Medications  Medication   [COMPLETED] acetaminophen  (TYLENOL ) tablet 325 mg   [COMPLETED] cyclobenzaprine  (FLEXERIL ) tablet 5 mg   acetaminophen  (TYLENOL ) tablet 650 mg   alum & mag hydroxide-simeth (MAALOX/MYLANTA) 200-200-20 MG/5ML suspension 30 mL   magnesium  hydroxide (MILK OF MAGNESIA) suspension 30 mL   haloperidol  (HALDOL ) tablet 5 mg   And   diphenhydrAMINE  (BENADRYL ) capsule 50 mg   haloperidol  lactate (HALDOL ) injection 5 mg   And   diphenhydrAMINE  (BENADRYL ) injection 50 mg   And   LORazepam  (ATIVAN ) injection 2 mg   haloperidol  lactate (HALDOL ) injection 10 mg   And   diphenhydrAMINE  (BENADRYL ) injection 50 mg   And   LORazepam  (ATIVAN ) injection 2 mg   [COMPLETED] OLANZapine  (ZYPREXA ) tablet 7.5 mg   [COMPLETED] diphenhydrAMINE  (BENADRYL ) capsule 50 mg   PTA Medications  Medication Sig   risperiDONE  (RISPERDAL ) 1 MG tablet Take 1 tablet (1 mg total) by mouth 2 (two) times daily.   lidocaine  (LIDODERM ) 5 % Place 1 patch onto the skin daily. Remove & Discard patch within 12 hours or as directed by MD       04/16/2024    9:59 AM  Depression screen PHQ 2/9  Decreased Interest 1  Down, Depressed, Hopeless 1  PHQ - 2 Score 2  Altered sleeping 1  Tired, decreased energy 0  Change in appetite 0  Feeling bad or failure about yourself  0  Trouble concentrating 1  Moving slowly or fidgety/restless 0  Suicidal thoughts 0  PHQ-9 Score 4  Difficult doing work/chores Somewhat difficult    Flowsheet Row ED from 04/15/2024 in [facility] ED from 04/14/2024 in [facility] Emergency Department at [facility] ED from 04/13/2024 in [facility] Emergency Department at [facility]  C-SSRS RISK CATEGORY No Risk No Risk No Risk    Musculoskeletal  Strength & Muscle Tone: within normal limits Gait & Station: normal Patient leans: N/A  Psychiatric Specialty Exam  Presentation  General Appearance:  Bizarre; Disheveled  Eye  Contact: Fair  Speech: Pressured  Speech Volume: Normal  Handedness: Right   Mood and Affect  Mood: Euthymic  Affect: Flat   Thought Process  Thought Processes: Other (comment)  Descriptions of Associations:Intact  Orientation:Full (Time, Place and Person)  Thought Content:Obsessions  Diagnosis of Schizophrenia or Schizoaffective disorder in past: No    Hallucinations:Hallucinations: None Description of Auditory Hallucinations: could not say  Ideas of Reference:None  Suicidal Thoughts:Suicidal Thoughts: No SI Passive Intent and/or Plan: With Intent; With Means to Carry Out  Homicidal Thoughts:Homicidal Thoughts: No   Sensorium  Memory: Immediate Fair; Recent Fair; Remote Fair  Judgment: Fair  Insight: Fair   Chartered certified accountant: Fair  Attention Span: Fair  Recall: [employer] of Knowledge:  Fair  Language: Fair   Psychomotor Activity  Psychomotor Activity: Psychomotor Activity: Normal   Assets  Assets: Communication Skills; Desire for Improvement; Physical Health   Sleep  Sleep: Sleep: Fair  No Safety Checks orders active in given range  Nutritional Assessment (For OBS and FBC admissions only) Has the patient had a weight loss or gain of 10 pounds or more in the last 3 months?: No Has the patient had a decrease in food intake/or appetite?: No Does the patient have dental problems?: No Does the patient have eating habits or behaviors that may be indicators of an eating disorder including binging or inducing vomiting?: No Has the patient recently lost weight without trying?: 0 Has the patient been eating poorly because of a decreased appetite?: 0 Malnutrition Screening Tool Score: 0    Physical Exam  Physical Exam Vitals and nursing note reviewed.  HENT:     Head: Normocephalic and atraumatic.     Right Ear: Tympanic membrane normal.     Left Ear: Tympanic membrane normal.     Nose: Nose normal.      Mouth/Throat:     Mouth: Mucous membranes are moist.  Eyes:     Extraocular Movements: Extraocular movements intact.     Pupils: Pupils are equal, round, and reactive to light.  Cardiovascular:     Rate and Rhythm: Normal rate.     Pulses: Normal pulses.  Pulmonary:     Effort: Pulmonary effort is normal.  Musculoskeletal:        General: Normal range of motion.     Cervical back: Normal range of motion and neck supple.  Neurological:     General: No focal deficit present.     Mental Status: He is alert and oriented to person, place, and time.  Psychiatric:        Thought Content: Thought content normal.    Review of Systems  Constitutional: Negative.   HENT: Negative.    Eyes: Negative.   Respiratory: Negative.    Cardiovascular: Negative.   Gastrointestinal: Negative.   Genitourinary: Negative.   Musculoskeletal: Negative.   Skin: Negative.   Neurological: Negative.   Endo/Heme/Allergies: Negative.   Psychiatric/Behavioral: Negative.     Blood pressure 133/81, pulse 60, temperature 97.8 F (36.6 C), temperature source Oral, resp. rate 18, SpO2 98%. There is no height or weight on file to calculate BMI.  Demographic Factors:  Male, Low socioeconomic status, and Unemployed  Loss Factors: Financial problems/change in socioeconomic status  Historical Factors: NA  Risk Reduction Factors:   NA  Continued Clinical Symptoms:  Schizophrenia:   Paranoid or undifferentiated type  Cognitive Features That Contribute To Risk:  None    Suicide Risk:  Minimal: No identifiable suicidal ideation.  Patients presenting with no risk factors but with morbid ruminations; may be classified as minimal risk based on the severity of the depressive symptoms  Plan Of Care/Follow-up recommendations:  Activity:  As tolerated Diet:  Regular  Disposition: Discharged with community resources. Recommended to follow  up at [facility] services.   [name], NP 04/16/2024,  10:00 AM

## 2024-04-16 NOTE — ED Triage Notes (Signed)
 Pt report dizziness and shortness of breath started after he left ED today. Ambulatory at triage

## 2024-04-16 NOTE — ED Triage Notes (Signed)
 PT thinks he has food poisoning. PT has been here two other times today each time with a different complaint.

## 2024-04-16 NOTE — ED Notes (Signed)
 Pt asleep at this hour. No apparent distress. RR even and unlabored. Monitored for safety.

## 2024-04-16 NOTE — ED Provider Notes (Signed)
  [place] EMERGENCY DEPARTMENT AT [facility] Provider Note   CSN: [id number] Arrival date & time: 04/16/24  1622     Patient presents with: No chief complaint on file.   Aaron Rubio is a 43 y.o. male.   Patient to ED concerned with dryness of skin on hands and lips. He states he was given a medication to use on his lips and it is no better.   The history is provided by the patient. No language interpreter was used.       Prior to Admission medications   Not on File    Allergies: Patient has no known allergies.    Review of Systems  Updated Vital Signs BP (!) 148/102 (BP Location: Right Arm)   Pulse 79   Temp 98 F (36.7 C) (Oral)   Resp 15   SpO2 99%   Physical Exam Vitals and nursing note reviewed.  Constitutional:      Appearance: He is well-developed.  HENT:     Mouth/Throat:     Comments: Lips dry, appear chapped. Fissures, bleeding, swelling or sign of infection. Pulmonary:     Effort: Pulmonary effort is normal.  Musculoskeletal:        General: Normal range of motion.     Cervical back: Normal range of motion.  Skin:    General: Skin is warm and dry.     Comments: Hand dry without skin breakdown, wounds, bleeding.   Neurological:     Mental Status: He is alert and oriented to person, place, and time.     (all labs ordered are listed, but only abnormal results are displayed) Labs Reviewed - No data to display  EKG: None  Radiology: No results found.   Procedures   Medications Ordered in the ED - No data to display  Clinical Course as of 04/16/24 1701  Wed Apr 16, 2024  1656 Patient with a long history of schizophrenia, malingering, substance abuse. Here after being [facility] from [facility]. Known to be volatile in behavior.   Will provide chapstick for dry lips from ED. Recommend Eucerin hand cream, found over-the-counter in a drug store. [SU]  1701 Security present to oversee discharge for patient  and staff safety. [SU]    Clinical Course User Index [SU] Odell Balls, PA-C                                 Medical Decision Making       Final diagnoses:  Chapped lips  Dry skin    ED Discharge Orders     None          Odell Balls, PA-C 04/16/24 1701    Geraldene Hamilton, MD 04/17/24 1534

## 2024-04-16 NOTE — Progress Notes (Signed)
 Pt is awake, alert and oriented. Pt is argumentative. Pt is demanding and guarded. Pt did not voice any complaints of pain or discomfort. No signs of acute distress noted. Staff will monitor for pt's safety.

## 2024-04-16 NOTE — Discharge Instructions (Signed)
 You can take Tylenol  for headache pain if this continues. No cause for your feeling of shortness of breath was identified but you have no sign of pneumonia, low oxygen, heart condition.   Follow up with the [facility] if you have no primary care provider, for further evaluation if symptoms persist or worsen.

## 2024-04-16 NOTE — Discharge Summary (Signed)
 Aaron Rubio to be discharged Home per NP order. An After Visit Summary was printed and given to the patient. All belongings returned. Patient escorted out and discharged home via private auto.  [name]  04/16/2024 10:39 AM

## 2024-04-16 NOTE — ED Triage Notes (Signed)
 Pt reports dizziness, headaches, SOB, and bilateral leg pain. Ambulatory in triage. No respiratory distress.

## 2024-04-16 NOTE — Discharge Instructions (Signed)
 No allergic reaction or serious health condition is present today. Use the chapstick provided for dry lips, and recommend Eucerin hand cream for dry skin of hands.

## 2024-04-16 NOTE — ED Provider Notes (Signed)
 [place] EMERGENCY DEPARTMENT AT [facility] Provider Note   CSN: [id number] Arrival date & time: 04/16/24  1949     Patient presents with: Shortness of Breath   Aaron Rubio is a 43 y.o. male.   Patient returns to the ED reporting symptoms of SOB and headache after being discharged earlier today. No cough or fever. He reports lower extremity soreness. He describes his headache as bilateral and pressure.   Shortness of Breath      Prior to Admission medications   Not on File    Allergies: Patient has no known allergies.    Review of Systems  Respiratory:  Positive for shortness of breath.     Updated Vital Signs BP 135/83 (BP Location: Right Arm)   Pulse 89   Temp 98.5 F (36.9 C)   Resp 20   SpO2 100%   Physical Exam Vitals and nursing note reviewed.  Constitutional:      Appearance: He is well-developed.  HENT:     Head: Normocephalic.  Eyes:     Pupils: Pupils are equal, round, and reactive to light.  Cardiovascular:     Rate and Rhythm: Normal rate and regular rhythm.     Heart sounds: No murmur heard. Pulmonary:     Effort: Pulmonary effort is normal.     Breath sounds: Normal breath sounds. No wheezing, rhonchi or rales.  Abdominal:     General: Bowel sounds are normal.     Palpations: Abdomen is soft.     Tenderness: There is no abdominal tenderness. There is no guarding or rebound.  Musculoskeletal:        General: Normal range of motion.     Cervical back: Normal range of motion and neck supple.  Skin:    General: Skin is warm and dry.  Neurological:     General: No focal deficit present.     Mental Status: He is alert and oriented to person, place, and time.     GCS: GCS eye subscore is 4. GCS verbal subscore is 5. GCS motor subscore is 6.     Cranial Nerves: No cranial nerve deficit, dysarthria or facial asymmetry.     Sensory: Sensation is intact.     Motor: No weakness or pronator drift.     Coordination:  Coordination normal.     Gait: Gait is intact.     Deep Tendon Reflexes:     Reflex Scores:      Patellar reflexes are 2+ on the right side and 2+ on the left side.    (all labs ordered are listed, but only abnormal results are displayed) Labs Reviewed - No data to display  EKG: None  Radiology: No results found.   Procedures   Medications Ordered in the ED  acetaminophen  (TYLENOL ) tablet 650 mg (650 mg Oral Given 04/16/24 2038)    Clinical Course as of 04/16/24 2038  Wed Apr 16, 2024  2031 Patient seen earlier today for dry skin condition and chapped lips, declined treatment for same when provided in the ED, returns with multiple complaints that started after being discharged. Sxs including HA, cough SOB.   He is ambulatory to the room, steady gait, in NAD. He goes to the room and immediately turns on the TV. He is well appearing, does not appear SOB, is not coughing. VSS, no hypoxia or tachycardia. Neurologic exam is normal wtihout any deficits. Do not suspect bleed, stroke, infection or other serious health conditions as cause  for headache. Lungs are clear - with normal VS and no exertional dyspnea, doubt PE, PTX, PNA. No significant chest pain to suggest dissection, MI. He is felt appropriate for discharge from the department. Tylenol  offered fro the headache.  [SU]    Clinical Course User Index [SU] Odell Balls, PA-C                                 Medical Decision Making       Final diagnoses:  Acute nonintractable headache, unspecified headache type    ED Discharge Orders     None          Odell Balls, DEVONNA 04/16/24 2038    Geraldene Hamilton, MD 04/17/24 1535

## 2024-04-16 NOTE — Discharge Instructions (Signed)

## 2024-04-16 NOTE — ED Triage Notes (Signed)
 Pt came in via POV d/t an allergic reaction to a lubricant, pt would not give anymore information other than that. States it happened a little over 48 hrs ago.

## 2024-04-16 NOTE — ED Notes (Signed)
 Patient got upset because he could not watch what he wanted on tv and he wanted the remote, explained he could not have it, and can only watch PG-13, he used profanity said to turn it off, I checked with the other patients no one else wanted to watch tv, turned it off.

## 2024-04-16 NOTE — ED Triage Notes (Signed)
 Pt unhappy at [employer]

## 2024-04-17 ENCOUNTER — Other Ambulatory Visit (HOSPITAL_COMMUNITY): Payer: Self-pay

## 2024-04-17 ENCOUNTER — Ambulatory Visit (HOSPITAL_COMMUNITY): Admission: EM | Admit: 2024-04-17 | Discharge: 2024-04-18 | Disposition: A | Discharge status: Home / Self Care

## 2024-04-17 DIAGNOSIS — Z765 Malingerer [conscious simulation]: Secondary | ICD-10-CM | POA: Diagnosis not present

## 2024-04-17 DIAGNOSIS — Z79899 Other long term (current) drug therapy: Secondary | ICD-10-CM | POA: Insufficient documentation

## 2024-04-17 DIAGNOSIS — R45851 Suicidal ideations: Secondary | ICD-10-CM | POA: Diagnosis present

## 2024-04-17 DIAGNOSIS — R440 Auditory hallucinations: Secondary | ICD-10-CM | POA: Diagnosis present

## 2024-04-17 DIAGNOSIS — Z5900 Homelessness unspecified: Secondary | ICD-10-CM | POA: Diagnosis not present

## 2024-04-17 DIAGNOSIS — F200 Paranoid schizophrenia: Secondary | ICD-10-CM | POA: Insufficient documentation

## 2024-04-17 DIAGNOSIS — F209 Schizophrenia, unspecified: Secondary | ICD-10-CM | POA: Diagnosis present

## 2024-04-17 MED ORDER — DIPHENHYDRAMINE HCL 50 MG PO CAPS: 50.0000 mg | ORAL_CAPSULE | Freq: Three times a day (TID) | ORAL | Status: DC | PRN

## 2024-04-17 MED ORDER — LORAZEPAM 2 MG/ML IJ SOLN: 2.0000 mg | Freq: Three times a day (TID) | INTRAMUSCULAR | Status: DC | PRN

## 2024-04-17 MED ORDER — MAGNESIUM HYDROXIDE 400 MG/5ML PO SUSP: 30.0000 mL | Freq: Every day | ORAL | Status: DC | PRN

## 2024-04-17 MED ORDER — HALOPERIDOL 5 MG PO TABS: 5.0000 mg | ORAL_TABLET | Freq: Three times a day (TID) | ORAL | Status: DC | PRN

## 2024-04-17 MED ORDER — LOPERAMIDE HCL 2 MG PO CAPS
2.0000 mg | ORAL_CAPSULE | Freq: Four times a day (QID) | ORAL | 0 refills | Status: DC | PRN
  Filled 2024-04-17: qty 12, 3d supply, fill #0

## 2024-04-17 MED ORDER — DIPHENHYDRAMINE HCL 50 MG/ML IJ SOLN: 50.0000 mg | Freq: Three times a day (TID) | INTRAMUSCULAR | Status: DC | PRN

## 2024-04-17 MED ORDER — ACETAMINOPHEN 325 MG PO TABS: 650.0000 mg | ORAL_TABLET | Freq: Four times a day (QID) | ORAL | Status: DC | PRN

## 2024-04-17 MED ORDER — CLONIDINE HCL 0.1 MG PO TABS
0.1000 mg | ORAL_TABLET | Freq: Once | ORAL | Status: AC
  Administered 2024-04-17: 0.1 mg via ORAL
  Filled 2024-04-17: qty 1

## 2024-04-17 MED ORDER — HALOPERIDOL LACTATE 5 MG/ML IJ SOLN: 5.0000 mg | Freq: Three times a day (TID) | INTRAMUSCULAR | Status: DC | PRN

## 2024-04-17 MED ORDER — HALOPERIDOL LACTATE 5 MG/ML IJ SOLN: 10.0000 mg | Freq: Three times a day (TID) | INTRAMUSCULAR | Status: DC | PRN

## 2024-04-17 MED ORDER — RISPERIDONE 1 MG PO TABS
1.0000 mg | ORAL_TABLET | Freq: Two times a day (BID) | ORAL | Status: DC
  Administered 2024-04-17: 1 mg via ORAL
  Filled 2024-04-17 (×2): qty 1

## 2024-04-17 MED ORDER — TRAZODONE HCL 50 MG PO TABS
50.0000 mg | ORAL_TABLET | Freq: Every evening | ORAL | Status: DC | PRN
  Administered 2024-04-17: 50 mg via ORAL
  Filled 2024-04-17: qty 1

## 2024-04-17 MED ORDER — ALUM & MAG HYDROXIDE-SIMETH 200-200-20 MG/5ML PO SUSP: 30.0000 mL | ORAL | Status: DC | PRN

## 2024-04-17 NOTE — ED Provider Notes (Addendum)
 [facility] Continuous Assessment Admission H&P  Date: 04/17/24 Patient Name: Aaron Rubio MRN: [id number] Chief Complaint: I need antibacterial ointment for my mouth  Diagnoses:  Final diagnoses:  Schizophrenia, paranoid (HCC)  Malingering    HPI: Per Triage, patient presented  to [facility] voluntarily, unaccompanied , complaining of HI, without plan or intent and VH. He  reported  experiencing jerking like movement and talking to himself. Pt reported  VH and sees an indvidual sitting with their legs folded. Patient rambles about various topics and could not stay focused on some of the questions posed. He reported being prescribed medication Risperdal  and Trazadone, but does not remember the last time he has taken medication. Patient was seen earlier today at [facility] ED and was discharged. Patient denied  SI, AH and substance/alcohol use.   Per chart review, patient was serviced here  when he presented on 04/15/2024 and was admitted to observation unit.  He was discharged yesterday as his main concerns were homelessness, finances, food and clothing needs. He was given community resources for housing, food and clothing donation. Patient was discharged with recommendations to follow up at [facility] services.  Today patient is seen face-to-face again and chart is reviewed. He is sitting in the assessment room alone, talking to himself, with disorganized thinking. His hygiene is improved comparing to yesterday. He is preoccupied and states he needs antibacterial ointment for his mouth. When asked what happened to his mouth, patient makes a long, non-meaningful statement  then states that's what means being in [place].  Patient becomes more and more talkative not allowing provider to complete the session.  He then says oh you're mad because you can't show yourself....   Patient presents with altered thought process. Admission to observation unit for overnight monitoring and  further evaluations in AM to determine disposition.  Total Time spent with patient: 30 minutes  Musculoskeletal  Strength & Muscle Tone: within normal limits Gait & Station: normal Patient leans: N/A  Psychiatric Specialty Exam  Presentation General Appearance:  Casual  Eye Contact: Fair  Speech: Pressured  Speech Volume: Normal  Handedness: Right   Mood and Affect  Mood: Anxious; Labile  Affect: Labile   Thought Process  Thought Processes: Disorganized; Irrevelant  Descriptions of Associations:Tangential  Orientation:Partial  Thought Content:Illogical; Obsessions; Tangential  Diagnosis of Schizophrenia or Schizoaffective disorder in past: Yes  Duration of Psychotic Symptoms: Greater than six months  Hallucinations:Hallucinations: Auditory Description of Auditory Hallucinations: talking to self, preoccupied  Ideas of Reference:None  Suicidal Thoughts:Suicidal Thoughts: No  Homicidal Thoughts:Homicidal Thoughts: No   Sensorium  Memory: Immediate Poor; Recent Poor; Remote Poor  Judgment: Poor  Insight: Poor   Executive Functions  Concentration: Poor  Attention Span: Fair  Recall: Poor  Fund of Knowledge: Fair  Language: Fair   Psychomotor Activity  Psychomotor Activity: Psychomotor Activity: Restlessness   Assets  Assets: Communication Skills; Desire for Improvement   Sleep  Sleep: Sleep: Poor Number of Hours of Sleep: 7   Nutritional Assessment (For OBS and FBC admissions only) Has the patient had a weight loss or gain of 10 pounds or more in the last 3 months?: No Has the patient had a decrease in food intake/or appetite?: No Does the patient have dental problems?: No Does the patient have eating habits or behaviors that may be indicators of an eating disorder including binging or inducing vomiting?: No Has the patient recently lost weight without trying?: 0 Has the patient been eating poorly because of a  decreased appetite?: 0 Malnutrition Screening Tool Score: 0    Physical Exam Vitals reviewed.  HENT:     Head: Normocephalic and atraumatic.     Right Ear: Tympanic membrane normal.     Left Ear: Tympanic membrane normal.     Nose: Nose normal.  Eyes:     Extraocular Movements: Extraocular movements intact.     Pupils: Pupils are equal, round, and reactive to light.  Cardiovascular:     Rate and Rhythm: Normal rate.     Pulses: Normal pulses.  Pulmonary:     Effort: Pulmonary effort is normal.  Musculoskeletal:        General: Normal range of motion.  Neurological:     General: No focal deficit present.     Mental Status: He is alert.    Review of Systems  Constitutional: Negative.   HENT: Negative.    Eyes: Negative.   Respiratory: Negative.    Cardiovascular: Negative.   Gastrointestinal: Negative.   Genitourinary: Negative.   Musculoskeletal: Negative.   Skin: Negative.   Neurological: Negative.   Endo/Heme/Allergies: Negative.   Psychiatric/Behavioral:  Positive for hallucinations.     Blood pressure (!) 149/103, pulse 77, temperature 98.2 F (36.8 C), temperature source Oral, resp. rate 18, SpO2 98%. There is no height or weight on file to calculate BMI.  Past Psychiatric History: Schizophrenia, Depression   Is the patient at risk to self? No  Has the patient been a risk to self in the past 6 months? No .    Has the patient been a risk to self within the distant past? No   Is the patient a risk to others? No   Has the patient been a risk to others in the past 6 months? No   Has the patient been a risk to others within the distant past? No   Past Medical History: NA  Family History: NA  Social History: NA  Last Labs:  Admission on 04/15/2024, Discharged on 04/16/2024  Component Date Value Ref Range Status   Alcohol, Ethyl (B) 04/15/2024 <15  <15 mg/dL Final   Comment: (NOTE) For medical purposes only. Performed at [facility], [address], [place], [place] [address]    TSH 04/15/2024 0.945  0.350 - 4.500 uIU/mL Final   Comment: Performed by a 3rd Generation assay with a functional sensitivity of <=0.01 uIU/mL. Performed at [facility], [address] [place], [place] [address]    Sodium 04/15/2024 139  135 - 145 mmol/L Final   Potassium 04/15/2024 4.3  3.5 - 5.1 mmol/L Final   Chloride 04/15/2024 105  98 - 111 mmol/L Final   CO2 04/15/2024 26  22 - 32 mmol/L Final   Glucose, Bld 04/15/2024 89  70 - 99 mg/dL Final   Glucose reference range applies only to samples taken after fasting for at least 8 hours.   BUN 04/15/2024 20  6 - 20 mg/dL Final   Creatinine, Ser 04/15/2024 1.23  0.61 - 1.24 mg/dL Final   Calcium 89/85/7974 8.7 (L)  8.9 - 10.3 mg/dL Final   Total Protein 89/85/7974 5.1 (L)  6.5 - 8.1 g/dL Final   Albumin 89/85/7974 3.1 (L)  3.5 - 5.0 g/dL Final   AST 89/85/7974 29  15 - 41 U/L Final   ALT 04/15/2024 21  0 - 44 U/L Final   Alkaline Phosphatase 04/15/2024 55  38 - 126 U/L Final   Total Bilirubin 04/15/2024 0.3  0.0 -  1.2 mg/dL Final   GFR, Estimated 04/15/2024 >60  >60 mL/min Final   Comment: (NOTE) Calculated using the CKD-EPI Creatinine Equation (2021)    Anion gap 04/15/2024 8  5 - 15 Final   Performed at [facility], [address] [place], [place] [address]   WBC 04/15/2024 7.6  4.0 - 10.5 K/uL Final   RBC 04/15/2024 4.97  4.22 - 5.81 MIL/uL Final   Hemoglobin 04/15/2024 12.9 (L)  13.0 - 17.0 g/dL Final   HCT 89/85/7974 40.7  39.0 - 52.0 % Final   MCV 04/15/2024 81.9  80.0 - 100.0 fL Final   MCH 04/15/2024 26.0  26.0 - 34.0 pg Final   MCHC 04/15/2024 31.7  30.0 - 36.0 g/dL Final   RDW 89/85/7974 13.7  11.5 - 15.5 % Final   Platelets 04/15/2024 239  150 - 400 K/uL Final   nRBC 04/15/2024 0.0  0.0 - 0.2 % Final   Neutrophils Relative % 04/15/2024 51  % Final   Neutro Abs 04/15/2024 3.8  1.7 - 7.7 K/uL Final   Lymphocytes Relative 04/15/2024 32  % Final   Lymphs Abs 04/15/2024  2.4  0.7 - 4.0 K/uL Final   Monocytes Relative 04/15/2024 9  % Final   Monocytes Absolute 04/15/2024 0.7  0.1 - 1.0 K/uL Final   Eosinophils Relative 04/15/2024 7  % Final   Eosinophils Absolute 04/15/2024 0.6 (H)  0.0 - 0.5 K/uL Final   Basophils Relative 04/15/2024 1  % Final   Basophils Absolute 04/15/2024 0.1  0.0 - 0.1 K/uL Final   Immature Granulocytes 04/15/2024 0  % Final   Abs Immature Granulocytes 04/15/2024 0.02  0.00 - 0.07 K/uL Final   Performed at [facility], [address] [place], [place] [address]   POC Amphetamine UR 04/15/2024 None Detected  NONE DETECTED (Cut Off Level 1000 ng/mL) Final   POC Secobarbital (BAR) 04/15/2024 None Detected  NONE DETECTED (Cut Off Level 300 ng/mL) Final   POC Buprenorphine (BUP) 04/15/2024 None Detected  NONE DETECTED (Cut Off Level 10 ng/mL) Final   POC Oxazepam (BZO) 04/15/2024 None Detected (A)  NONE DETECTED (Cut Off Level 300 ng/mL) Final   POC Cocaine UR 04/15/2024 Positive (A)  NONE DETECTED (Cut Off Level 300 ng/mL) Final   POC Methamphetamine UR 04/15/2024 None Detected  NONE DETECTED (Cut Off Level 1000 ng/mL) Final   POC Morphine 04/15/2024 None Detected  NONE DETECTED (Cut Off Level 300 ng/mL) Final   POC Methadone UR 04/15/2024 None Detected  NONE DETECTED (Cut Off Level 300 ng/mL) Final   POC Oxycodone UR 04/15/2024 None Detected  NONE DETECTED (Cut Off Level 100 ng/mL) Final   POC Marijuana UR 04/15/2024 None Detected  NONE DETECTED (Cut Off Level 50 ng/mL) Final  Admission on 04/06/2024, Discharged on 04/06/2024  Component Date Value Ref Range Status   Sodium 04/06/2024 137  135 - 145 mmol/L Final   Potassium 04/06/2024 4.2  3.5 - 5.1 mmol/L Final   Chloride 04/06/2024 101  98 - 111 mmol/L Final   CO2 04/06/2024 24  22 - 32 mmol/L Final   Glucose, Bld 04/06/2024 70  70 - 99 mg/dL Final   Glucose reference range applies only to samples taken after fasting for at least 8 hours.   BUN 04/06/2024 17  6 - 20 mg/dL Final    Creatinine, Ser 04/06/2024 1.13  0.61 - 1.24 mg/dL Final   Calcium 89/94/7974 9.3  8.9 - 10.3 mg/dL Final  Total Protein 04/06/2024 6.1 (L)  6.5 - 8.1 g/dL Final   Albumin 89/94/7974 4.1  3.5 - 5.0 g/dL Final   AST 89/94/7974 29  15 - 41 U/L Final   ALT 04/06/2024 18  0 - 44 U/L Final   Alkaline Phosphatase 04/06/2024 58  38 - 126 U/L Final   Total Bilirubin 04/06/2024 0.8  0.0 - 1.2 mg/dL Final   GFR, Estimated 04/06/2024 >60  >60 mL/min Final   Comment: (NOTE) Calculated using the CKD-EPI Creatinine Equation (2021)    Anion gap 04/06/2024 13  5 - 15 Final   Performed at [facility], [address] [place], [place] [address]   Opiates 04/06/2024 NEGATIVE  NEGATIVE Final   Cocaine 04/06/2024 POSITIVE (A)  NEGATIVE Final   Benzodiazepines 04/06/2024 NEGATIVE  NEGATIVE Final   Amphetamines 04/06/2024 NEGATIVE  NEGATIVE Final   Tetrahydrocannabinol 04/06/2024 NEGATIVE  NEGATIVE Final   Barbiturates 04/06/2024 NEGATIVE  NEGATIVE Final   Methadone Scn, Ur 04/06/2024 NEGATIVE  NEGATIVE Final   Fentanyl  04/06/2024 NEGATIVE  NEGATIVE Final   Comment: (NOTE) Drug screen is for Medical Purposes only. Positive results are preliminary only. If confirmation is needed, notify lab within 5 days.  Drug Class                 Cutoff (ng/mL) Amphetamine and metabolites 1000 Barbiturate and metabolites 200 Benzodiazepine              200 Opiates and metabolites     300 Cocaine and metabolites     300 THC                         50 Fentanyl                     5 Methadone                   300  Trazodone  is metabolized in vivo to several metabolites,  including pharmacologically active m-CPP, which is excreted in the  urine.  Immunoassay screens for amphetamines and MDMA have potential  cross-reactivity with these compounds and may provide false positive  result.  Performed at [facility], [address], [place], [place] [address]    Salicylate  Lvl 04/06/2024 <7.0 (L)  7.0 - 30.0 mg/dL Final   Performed at [facility], [address], [place], [place] [address]   Acetaminophen  (Tylenol ), Serum 04/06/2024 <10 (L)  10 - 30 ug/mL Final   Comment: (NOTE) Toxic concentrations can be more effectively related to post dose interval; > 200, > 100, and > 50 ug/mL serum concentrations correspond to toxic concentrations at 4, 8, and 12 hours post dose, respectively.  Performed at [facility], [address] [place], [place] [address]    WBC 04/06/2024 10.8 (H)  4.0 - 10.5 K/uL Final   RBC 04/06/2024 5.69  4.22 - 5.81 MIL/uL Final   Hemoglobin 04/06/2024 14.3  13.0 - 17.0 g/dL Final   HCT 89/94/7974 46.6  39.0 - 52.0 % Final   MCV 04/06/2024 81.9  80.0 - 100.0 fL Final   MCH 04/06/2024 25.1 (L)  26.0 - 34.0 pg Final   MCHC 04/06/2024 30.7  30.0 - 36.0 g/dL Final   RDW 89/94/7974 13.6  11.5 - 15.5 % Final   Platelets 04/06/2024 272  150 - 400 K/uL Final   nRBC 04/06/2024 0.0  0.0 - 0.2 % Final  Neutrophils Relative % 04/06/2024 68  % Final   Neutro Abs 04/06/2024 7.4  1.7 - 7.7 K/uL Final   Lymphocytes Relative 04/06/2024 20  % Final   Lymphs Abs 04/06/2024 2.2  0.7 - 4.0 K/uL Final   Monocytes Relative 04/06/2024 9  % Final   Monocytes Absolute 04/06/2024 1.0  0.1 - 1.0 K/uL Final   Eosinophils Relative 04/06/2024 1  % Final   Eosinophils Absolute 04/06/2024 0.1  0.0 - 0.5 K/uL Final   Basophils Relative 04/06/2024 1  % Final   Basophils Absolute 04/06/2024 0.1  0.0 - 0.1 K/uL Final   Immature Granulocytes 04/06/2024 1  % Final   Abs Immature Granulocytes 04/06/2024 0.05  0.00 - 0.07 K/uL Final   Performed at [facility], [address], [place], [place] [address]   Alcohol, Ethyl (B) 04/06/2024 <15  <15 mg/dL Final   Comment: (NOTE) For medical purposes only. Performed at [facility], [address] [place], [place] [address]   Admission on  03/15/2024, Discharged on 03/16/2024  Component Date Value Ref Range Status   Sodium 03/15/2024 139  135 - 145 mmol/L Final   Potassium 03/15/2024 4.0  3.5 - 5.1 mmol/L Final   Chloride 03/15/2024 104  98 - 111 mmol/L Final   CO2 03/15/2024 23  22 - 32 mmol/L Final   Glucose, Bld 03/15/2024 83  70 - 99 mg/dL Final   Glucose reference range applies only to samples taken after fasting for at least 8 hours.   BUN 03/15/2024 14  6 - 20 mg/dL Final   Creatinine, Ser 03/15/2024 1.07  0.61 - 1.24 mg/dL Final   Calcium 90/86/7974 9.5  8.9 - 10.3 mg/dL Final   Total Protein 90/86/7974 6.3 (L)  6.5 - 8.1 g/dL Final   Albumin 90/86/7974 4.1  3.5 - 5.0 g/dL Final   AST 90/86/7974 30  15 - 41 U/L Final   HEMOLYSIS AT THIS LEVEL MAY AFFECT RESULT   ALT 03/15/2024 23  0 - 44 U/L Final   Alkaline Phosphatase 03/15/2024 58  38 - 126 U/L Final   Total Bilirubin 03/15/2024 0.5  0.0 - 1.2 mg/dL Final   GFR, Estimated 03/15/2024 >60  >60 mL/min Final   Comment: (NOTE) Calculated using the CKD-EPI Creatinine Equation (2021)    Anion gap 03/15/2024 12  5 - 15 Final   Performed at [facility], [address], [place], [place] [address]   Alcohol, Ethyl (B) 03/15/2024 <15  <15 mg/dL Final   Comment: (NOTE) For medical purposes only. Performed at [facility], [address] [place], [place] [address]    Opiates 03/15/2024 NEGATIVE  NEGATIVE Final   Cocaine 03/15/2024 POSITIVE (A)  NEGATIVE Final   Benzodiazepines 03/15/2024 NEGATIVE  NEGATIVE Final   Amphetamines 03/15/2024 NEGATIVE  NEGATIVE Final   Tetrahydrocannabinol 03/15/2024 POSITIVE (A)  NEGATIVE Final   Barbiturates 03/15/2024 NEGATIVE  NEGATIVE Final   Methadone Scn, Ur 03/15/2024 NEGATIVE  NEGATIVE Final   Fentanyl  03/15/2024 NEGATIVE  NEGATIVE Final   Comment: (NOTE) Drug screen is for Medical Purposes only. Positive results are preliminary only. If confirmation is needed, notify lab within  5 days.  Drug Class                 Cutoff (ng/mL) Amphetamine and metabolites 1000 Barbiturate and metabolites 200 Benzodiazepine              200 Opiates and metabolites  300 Cocaine and metabolites     300 THC                         50 Fentanyl                     5 Methadone                   300  Trazodone  is metabolized in vivo to several metabolites,  including pharmacologically active m-CPP, which is excreted in the  urine.  Immunoassay screens for amphetamines and MDMA have potential  cross-reactivity with these compounds and may provide false positive  result.  Performed at [facility], [address] [place], [place] [address]    WBC 03/15/2024 7.4  4.0 - 10.5 K/uL Final   RBC 03/15/2024 5.15  4.22 - 5.81 MIL/uL Final   Hemoglobin 03/15/2024 13.2  13.0 - 17.0 g/dL Final   HCT 90/86/7974 43.9  39.0 - 52.0 % Final   MCV 03/15/2024 85.2  80.0 - 100.0 fL Final   MCH 03/15/2024 25.6 (L)  26.0 - 34.0 pg Final   MCHC 03/15/2024 30.1  30.0 - 36.0 g/dL Final   RDW 90/86/7974 13.8  11.5 - 15.5 % Final   Platelets 03/15/2024 228  150 - 400 K/uL Final   nRBC 03/15/2024 0.0  0.0 - 0.2 % Final   Performed at [facility], [address], [place], [place] [address]  Admission on 03/14/2024, Discharged on 03/15/2024  Component Date Value Ref Range Status   Sodium 03/15/2024 141  135 - 145 mmol/L Final   Potassium 03/15/2024 4.1  3.5 - 5.1 mmol/L Final   Chloride 03/15/2024 104  98 - 111 mmol/L Final   BUN 03/15/2024 12  6 - 20 mg/dL Final   Creatinine, Ser 03/15/2024 1.10  0.61 - 1.24 mg/dL Final   Glucose, Bld 90/86/7974 93  70 - 99 mg/dL Final   Glucose reference range applies only to samples taken after fasting for at least 8 hours.   Calcium, Ion 03/15/2024 1.19  1.15 - 1.40 mmol/L Final   TCO2 03/15/2024 26  22 - 32 mmol/L Final   Hemoglobin 03/15/2024 13.9  13.0 - 17.0 g/dL Final   HCT 90/86/7974 41.0  39.0 - 52.0 % Final   Admission on 03/06/2024, Discharged on 03/07/2024  Component Date Value Ref Range Status   Sodium 03/06/2024 140  135 - 145 mmol/L Final   Potassium 03/06/2024 3.7  3.5 - 5.1 mmol/L Final   Chloride 03/06/2024 104  98 - 111 mmol/L Final   CO2 03/06/2024 28  22 - 32 mmol/L Final   Glucose, Bld 03/06/2024 99  70 - 99 mg/dL Final   Glucose reference range applies only to samples taken after fasting for at least 8 hours.   BUN 03/06/2024 9  6 - 20 mg/dL Final   Creatinine, Ser 03/06/2024 1.02  0.61 - 1.24 mg/dL Final   Calcium 90/95/7974 9.0  8.9 - 10.3 mg/dL Final   Total Protein 90/95/7974 5.9 (L)  6.5 - 8.1 g/dL Final   Albumin 90/95/7974 3.5  3.5 - 5.0 g/dL Final   AST 90/95/7974 28  15 - 41 U/L Final   ALT 03/06/2024 19  0 - 44 U/L Final   Alkaline Phosphatase 03/06/2024 57  38 - 126 U/L Final   Total Bilirubin 03/06/2024 0.4  0.0 - 1.2 mg/dL Final   GFR, Estimated 03/06/2024 >  60  >60 mL/min Final   Comment: (NOTE) Calculated using the CKD-EPI Creatinine Equation (2021)    Anion gap 03/06/2024 8  5 - 15 Final   Performed at [facility], [address], [place], [place] [address]   Alcohol, Ethyl (B) 03/06/2024 <15  <15 mg/dL Final   Comment: (NOTE) For medical purposes only. Performed at [facility], [address], [place], [place] [address]    WBC 03/06/2024 7.2  4.0 - 10.5 K/uL Final   RBC 03/06/2024 5.41  4.22 - 5.81 MIL/uL Final   Hemoglobin 03/06/2024 14.1  13.0 - 17.0 g/dL Final   HCT 90/95/7974 45.6  39.0 - 52.0 % Final   MCV 03/06/2024 84.3  80.0 - 100.0 fL Final   MCH 03/06/2024 26.1  26.0 - 34.0 pg Final   MCHC 03/06/2024 30.9  30.0 - 36.0 g/dL Final   RDW 90/95/7974 13.9  11.5 - 15.5 % Final   Platelets 03/06/2024 284  150 - 400 K/uL Final   nRBC 03/06/2024 0.0  0.0 - 0.2 % Final   Performed at [facility], [address] [place], [place] [address]  Admission on 02/02/2024, Discharged on 02/03/2024  Component Date Value Ref Range Status    Sodium 02/02/2024 140  135 - 145 mmol/L Final   Potassium 02/02/2024 3.7  3.5 - 5.1 mmol/L Final   Chloride 02/02/2024 108  98 - 111 mmol/L Final   CO2 02/02/2024 23  22 - 32 mmol/L Final   Glucose, Bld 02/02/2024 131 (H)  70 - 99 mg/dL Final   Glucose reference range applies only to samples taken after fasting for at least 8 hours.   BUN 02/02/2024 18  6 - 20 mg/dL Final   Creatinine, Ser 02/02/2024 1.37 (H)  0.61 - 1.24 mg/dL Final   Calcium 91/97/7974 8.8 (L)  8.9 - 10.3 mg/dL Final   Total Protein 91/97/7974 6.1 (L)  6.5 - 8.1 g/dL Final   Albumin 91/97/7974 3.5  3.5 - 5.0 g/dL Final   AST 91/97/7974 38  15 - 41 U/L Final   ALT 02/02/2024 28  0 - 44 U/L Final   Alkaline Phosphatase 02/02/2024 83  38 - 126 U/L Final   Total Bilirubin 02/02/2024 0.6  0.0 - 1.2 mg/dL Final   GFR, Estimated 02/02/2024 >60  >60 mL/min Final   Comment: (NOTE) Calculated using the CKD-EPI Creatinine Equation (2021)    Anion gap 02/02/2024 9  5 - 15 Final   Performed at [facility], [address], [place], [place] [address]   Alcohol, Ethyl (B) 02/02/2024 <15  <15 mg/dL Final   Comment: (NOTE) For medical purposes only. Performed at [facility], [address], [place], [place] [address]    WBC 02/02/2024 6.8  4.0 - 10.5 K/uL Final   RBC 02/02/2024 4.64  4.22 - 5.81 MIL/uL Final   Hemoglobin 02/02/2024 12.3 (L)  13.0 - 17.0 g/dL Final   HCT 91/97/7974 39.1  39.0 - 52.0 % Final   MCV 02/02/2024 84.3  80.0 - 100.0 fL Final   MCH 02/02/2024 26.5  26.0 - 34.0 pg Final   MCHC 02/02/2024 31.5  30.0 - 36.0 g/dL Final   RDW 91/97/7974 14.1  11.5 - 15.5 % Final   Platelets 02/02/2024 230  150 - 400 K/uL Final   nRBC 02/02/2024 0.0  0.0 - 0.2 % Final   Performed at [facility], [address]  [place], [place] [address]   Opiates 02/03/2024 NONE DETECTED  NONE DETECTED Final   Cocaine 02/03/2024 POSITIVE (A)  NONE DETECTED Final   Benzodiazepines  02/03/2024 NONE DETECTED  NONE DETECTED Final   Amphetamines 02/03/2024 NONE DETECTED  NONE DETECTED Final   Tetrahydrocannabinol 02/03/2024 POSITIVE (A)  NONE DETECTED Final   Barbiturates 02/03/2024 NONE DETECTED  NONE DETECTED Final   Comment: (NOTE) DRUG SCREEN FOR MEDICAL PURPOSES ONLY.  IF CONFIRMATION IS NEEDED FOR ANY PURPOSE, NOTIFY LAB WITHIN 5 DAYS.  LOWEST DETECTABLE LIMITS FOR URINE DRUG SCREEN Drug Class                     Cutoff (ng/mL) Amphetamine and metabolites    1000 Barbiturate and metabolites    200 Benzodiazepine                 200 Opiates and metabolites        300 Cocaine and metabolites        300 THC                            50 Performed at [facility], [address] [place], [place] [address]   Admission on 01/14/2024, Discharged on 01/17/2024  Component Date Value Ref Range Status   Sodium 01/14/2024 134 (L)  135 - 145 mmol/L Final   Potassium 01/14/2024 3.1 (L)  3.5 - 5.1 mmol/L Final   Chloride 01/14/2024 99  98 - 111 mmol/L Final   CO2 01/14/2024 25  22 - 32 mmol/L Final   Glucose, Bld 01/14/2024 169 (H)  70 - 99 mg/dL Final   Glucose reference range applies only to samples taken after fasting for at least 8 hours.   BUN 01/14/2024 13  6 - 20 mg/dL Final   Creatinine, Ser 01/14/2024 1.39 (H)  0.61 - 1.24 mg/dL Final   Calcium 92/85/7974 9.2  8.9 - 10.3 mg/dL Final   Total Protein 92/85/7974 7.0  6.5 - 8.1 g/dL Final   Albumin 92/85/7974 3.9  3.5 - 5.0 g/dL Final   AST 92/85/7974 52 (H)  15 - 41 U/L Final   ALT 01/14/2024 36  0 - 44 U/L Final   Alkaline Phosphatase 01/14/2024 66  38 - 126 U/L Final   Total Bilirubin 01/14/2024 0.8  0.0 - 1.2 mg/dL Final   GFR, Estimated 01/14/2024 >60  >60 mL/min Final   Comment: (NOTE) Calculated using the CKD-EPI Creatinine Equation (2021)    Anion gap 01/14/2024 10  5 - 15 Final   Performed at [facility], [address] [place], [place] [address]   Alcohol,  Ethyl (B) 01/14/2024 <15  <15 mg/dL Final   Comment: (NOTE) For medical purposes only. Performed at [facility], [address], [place], [place] [address]    Opiates 01/14/2024 NONE DETECTED  NONE DETECTED Final   Cocaine 01/14/2024 POSITIVE (A)  NONE DETECTED Final   Benzodiazepines 01/14/2024 NONE DETECTED  NONE DETECTED Final   Amphetamines 01/14/2024 NONE DETECTED  NONE DETECTED Final   Tetrahydrocannabinol 01/14/2024 NONE DETECTED  NONE DETECTED Final   Barbiturates 01/14/2024 NONE DETECTED  NONE DETECTED Final   Comment: (NOTE) DRUG SCREEN FOR MEDICAL PURPOSES ONLY.  IF CONFIRMATION IS NEEDED FOR ANY PURPOSE, NOTIFY LAB WITHIN 5 DAYS.  LOWEST DETECTABLE LIMITS FOR URINE DRUG SCREEN Drug Class  Cutoff (ng/mL) Amphetamine and metabolites    1000 Barbiturate and metabolites    200 Benzodiazepine                 200 Opiates and metabolites        300 Cocaine and metabolites        300 THC                            50 Performed at [facility], [address], [place], [place] [address]    WBC 01/14/2024 9.2  4.0 - 10.5 K/uL Final   RBC 01/14/2024 5.18  4.22 - 5.81 MIL/uL Final   Hemoglobin 01/14/2024 13.7  13.0 - 17.0 g/dL Final   HCT 92/85/7974 43.4  39.0 - 52.0 % Final   MCV 01/14/2024 83.8  80.0 - 100.0 fL Final   MCH 01/14/2024 26.4  26.0 - 34.0 pg Final   MCHC 01/14/2024 31.6  30.0 - 36.0 g/dL Final   RDW 92/85/7974 13.5  11.5 - 15.5 % Final   Platelets 01/14/2024 274  150 - 400 K/uL Final   nRBC 01/14/2024 0.0  0.0 - 0.2 % Final   Neutrophils Relative % 01/14/2024 56  % Final   Neutro Abs 01/14/2024 5.1  1.7 - 7.7 K/uL Final   Lymphocytes Relative 01/14/2024 24  % Final   Lymphs Abs 01/14/2024 2.2  0.7 - 4.0 K/uL Final   Monocytes Relative 01/14/2024 9  % Final   Monocytes Absolute 01/14/2024 0.8  0.1 - 1.0 K/uL Final   Eosinophils Relative 01/14/2024 10  % Final   Eosinophils Absolute 01/14/2024 1.0  (H)  0.0 - 0.5 K/uL Final   Basophils Relative 01/14/2024 1  % Final   Basophils Absolute 01/14/2024 0.1  0.0 - 0.1 K/uL Final   Immature Granulocytes 01/14/2024 0  % Final   Abs Immature Granulocytes 01/14/2024 0.03  0.00 - 0.07 K/uL Final   Performed at [facility], [address], [place], [place] [address]   Acetaminophen  (Tylenol ), Serum 01/14/2024 12  10 - 30 ug/mL Final   Comment: (NOTE) Therapeutic concentrations vary significantly. A range of 10-30 ug/mL  may be an effective concentration for many patients. However, some  are best treated at concentrations outside of this range. Acetaminophen  concentrations >150 ug/mL at 4 hours after ingestion  and >50 ug/mL at 12 hours after ingestion are often associated with  toxic reactions.  Performed at [facility], [address], [place], [place] [address]    Salicylate Lvl 01/14/2024 <7.0 (L)  7.0 - 30.0 mg/dL Final   Performed at [facility], [address] [place], [place] [address]  Admission on 01/10/2024, Discharged on 01/11/2024  Component Date Value Ref Range Status   Sodium 01/10/2024 134 (L)  135 - 145 mmol/L Final   Potassium 01/10/2024 3.6  3.5 - 5.1 mmol/L Final   Chloride 01/10/2024 101  98 - 111 mmol/L Final   CO2 01/10/2024 22  22 - 32 mmol/L Final   Glucose, Bld 01/10/2024 103 (H)  70 - 99 mg/dL Final   Glucose reference range applies only to samples taken after fasting for at least 8 hours.   BUN 01/10/2024 20  6 - 20 mg/dL Final   Creatinine, Ser 01/10/2024 1.27 (H)  0.61 - 1.24 mg/dL Final   Calcium 92/89/7974 9.1  8.9 - 10.3 mg/dL Final   Total Protein 92/89/7974 6.6  6.5 - 8.1 g/dL Final   Albumin 92/89/7974 4.0  3.5 - 5.0 g/dL Final   AST 92/89/7974 84 (H)  15 - 41 U/L Final   ALT 01/10/2024 43  0 - 44 U/L Final   Alkaline Phosphatase 01/10/2024 79  38 - 126 U/L Final   Total Bilirubin 01/10/2024 0.6  0.0 - 1.2 mg/dL Final   GFR, Estimated  01/10/2024 >60  >60 mL/min Final   Comment: (NOTE) Calculated using the CKD-EPI Creatinine Equation (2021)    Anion gap 01/10/2024 11  5 - 15 Final   Performed at [facility], [address] [place], [place] [address]   Alcohol, Ethyl (B) 01/10/2024 <15  <15 mg/dL Final   Comment: (NOTE) For medical purposes only. Performed at [facility], [address] [place], [place] [address]    WBC 01/10/2024 9.4  4.0 - 10.5 K/uL Final   RBC 01/10/2024 5.06  4.22 - 5.81 MIL/uL Final   Hemoglobin 01/10/2024 13.3  13.0 - 17.0 g/dL Final   HCT 92/89/7974 42.4  39.0 - 52.0 % Final   MCV 01/10/2024 83.8  80.0 - 100.0 fL Final   MCH 01/10/2024 26.3  26.0 - 34.0 pg Final   MCHC 01/10/2024 31.4  30.0 - 36.0 g/dL Final   RDW 92/89/7974 13.2  11.5 - 15.5 % Final   Platelets 01/10/2024 280  150 - 400 K/uL Final   nRBC 01/10/2024 0.0  0.0 - 0.2 % Final   Performed at [facility], [address], [place], [place] [address]   Opiates 01/10/2024 NONE DETECTED  NONE DETECTED Final   Cocaine 01/10/2024 POSITIVE (A)  NONE DETECTED Final   Benzodiazepines 01/10/2024 NONE DETECTED  NONE DETECTED Final   Amphetamines 01/10/2024 NONE DETECTED  NONE DETECTED Final   Tetrahydrocannabinol 01/10/2024 POSITIVE (A)  NONE DETECTED Final   Barbiturates 01/10/2024 NONE DETECTED  NONE DETECTED Final   Comment: (NOTE) DRUG SCREEN FOR MEDICAL PURPOSES ONLY.  IF CONFIRMATION IS NEEDED FOR ANY PURPOSE, NOTIFY LAB WITHIN 5 DAYS.  LOWEST DETECTABLE LIMITS FOR URINE DRUG SCREEN Drug Class                     Cutoff (ng/mL) Amphetamine and metabolites    1000 Barbiturate and metabolites    200 Benzodiazepine                 200 Opiates and metabolites        300 Cocaine and metabolites        300 THC                            50 Performed at [facility], [address], [place], [place] [address]   Admission on 01/09/2024, Discharged on 01/10/2024  Component Date Value Ref Range Status    Sodium 01/09/2024 134 (L)  135 - 145 mmol/L Final   Potassium 01/09/2024 3.2 (L)  3.5 - 5.1 mmol/L Final   Chloride 01/09/2024 97 (L)  98 - 111 mmol/L Final   CO2 01/09/2024 25  22 - 32 mmol/L Final   Glucose, Bld 01/09/2024 110 (H)  70 - 99 mg/dL Final   Glucose reference range applies only to samples taken after fasting for at least 8 hours.   BUN 01/09/2024 24 (H)  6 - 20 mg/dL Final   Creatinine, Ser 01/09/2024 1.38 (H)  0.61 - 1.24 mg/dL Final  Calcium 01/09/2024 9.4  8.9 - 10.3 mg/dL Final   Total Protein 92/90/7974 7.8  6.5 - 8.1 g/dL Final   Albumin 92/90/7974 4.6  3.5 - 5.0 g/dL Final   AST 92/90/7974 122 (H)  15 - 41 U/L Final   ALT 01/09/2024 49 (H)  0 - 44 U/L Final   Alkaline Phosphatase 01/09/2024 65  38 - 126 U/L Final   Total Bilirubin 01/09/2024 1.1  0.0 - 1.2 mg/dL Final   GFR, Estimated 01/09/2024 >60  >60 mL/min Final   Comment: (NOTE) Calculated using the CKD-EPI Creatinine Equation (2021)    Anion gap 01/09/2024 12  5 - 15 Final   Performed at [facility], [address], [place], [place] [address]   Alcohol, Ethyl (B) 01/09/2024 <15  <15 mg/dL Final   Comment: (NOTE) For medical purposes only. Performed at [facility], [address] [place], [place] [address]    WBC 01/09/2024 12.7 (H)  4.0 - 10.5 K/uL Final   RBC 01/09/2024 5.26  4.22 - 5.81 MIL/uL Final   Hemoglobin 01/09/2024 13.9  13.0 - 17.0 g/dL Final   HCT 92/90/7974 42.9  39.0 - 52.0 % Final   MCV 01/09/2024 81.6  80.0 - 100.0 fL Final   MCH 01/09/2024 26.4  26.0 - 34.0 pg Final   MCHC 01/09/2024 32.4  30.0 - 36.0 g/dL Final   RDW 92/90/7974 13.2  11.5 - 15.5 % Final   Platelets 01/09/2024 278  150 - 400 K/uL Final   nRBC 01/09/2024 0.0  0.0 - 0.2 % Final   Performed at [facility], [address] [place], [place] [address]  Admission on 01/05/2024, Discharged on 01/07/2024  Component Date Value Ref Range Status   Sodium 01/05/2024  136  135 - 145 mmol/L Final   Potassium 01/05/2024 3.5  3.5 - 5.1 mmol/L Final   Chloride 01/05/2024 100  98 - 111 mmol/L Final   CO2 01/05/2024 21 (L)  22 - 32 mmol/L Final   Glucose, Bld 01/05/2024 118 (H)  70 - 99 mg/dL Final   Glucose reference range applies only to samples taken after fasting for at least 8 hours.   BUN 01/05/2024 28 (H)  6 - 20 mg/dL Final   Creatinine, Ser 01/05/2024 1.28 (H)  0.61 - 1.24 mg/dL Final   Calcium 92/94/7974 9.9  8.9 - 10.3 mg/dL Final   Total Protein 92/94/7974 8.0  6.5 - 8.1 g/dL Final   Albumin 92/94/7974 4.6  3.5 - 5.0 g/dL Final   AST 92/94/7974 68 (H)  15 - 41 U/L Final   ALT 01/05/2024 35  0 - 44 U/L Final   Alkaline Phosphatase 01/05/2024 66  38 - 126 U/L Final   Total Bilirubin 01/05/2024 1.3 (H)  0.0 - 1.2 mg/dL Final   GFR, Estimated 01/05/2024 >60  >60 mL/min Final   Comment: (NOTE) Calculated using the CKD-EPI Creatinine Equation (2021)    Anion gap 01/05/2024 15  5 - 15 Final   Performed at [facility], [address], [place], [place] [address]   Alcohol, Ethyl (B) 01/05/2024 <15  <15 mg/dL Final   Comment: (NOTE) For medical purposes only. Performed at [facility], [address] [place], [place] [address]    WBC 01/05/2024 14.5 (H)  4.0 - 10.5 K/uL Final   RBC 01/05/2024 5.46  4.22 - 5.81 MIL/uL Final   Hemoglobin 01/05/2024 14.2  13.0 - 17.0 g/dL Final  HCT 01/05/2024 44.9  39.0 - 52.0 % Final   MCV 01/05/2024 82.2  80.0 - 100.0 fL Final   MCH 01/05/2024 26.0  26.0 - 34.0 pg Final   MCHC 01/05/2024 31.6  30.0 - 36.0 g/dL Final   RDW 92/94/7974 13.5  11.5 - 15.5 % Final   Platelets 01/05/2024 291  150 - 400 K/uL Final   nRBC 01/05/2024 0.0  0.0 - 0.2 % Final   Performed at [facility], [address], [place], [place] [address]   Opiates 01/05/2024 NONE DETECTED  NONE DETECTED Final   Cocaine 01/05/2024 POSITIVE (A)  NONE DETECTED Final   Benzodiazepines  01/05/2024 NONE DETECTED  NONE DETECTED Final   Amphetamines 01/05/2024 NONE DETECTED  NONE DETECTED Final   Tetrahydrocannabinol 01/05/2024 NONE DETECTED  NONE DETECTED Final   Barbiturates 01/05/2024 NONE DETECTED  NONE DETECTED Final   Comment: (NOTE) DRUG SCREEN FOR MEDICAL PURPOSES ONLY.  IF CONFIRMATION IS NEEDED FOR ANY PURPOSE, NOTIFY LAB WITHIN 5 DAYS.  LOWEST DETECTABLE LIMITS FOR URINE DRUG SCREEN Drug Class                     Cutoff (ng/mL) Amphetamine and metabolites    1000 Barbiturate and metabolites    200 Benzodiazepine                 200 Opiates and metabolites        300 Cocaine and metabolites        300 THC                            50 Performed at [facility], [address] [place], [place] [address]   There may be more visits with results that are not included.    Allergies: Patient has no known allergies.  Medications:  Facility Ordered Medications  Medication   acetaminophen  (TYLENOL ) tablet 650 mg   alum & mag hydroxide-simeth (MAALOX/MYLANTA) 200-200-20 MG/5ML suspension 30 mL   magnesium  hydroxide (MILK OF MAGNESIA) suspension 30 mL   haloperidol  lactate (HALDOL ) injection 5 mg   And   diphenhydrAMINE  (BENADRYL ) injection 50 mg   And   LORazepam  (ATIVAN ) injection 2 mg   haloperidol  lactate (HALDOL ) injection 10 mg   And   diphenhydrAMINE  (BENADRYL ) injection 50 mg   And   LORazepam  (ATIVAN ) injection 2 mg   traZODone  (DESYREL ) tablet 50 mg   haloperidol  (HALDOL ) tablet 5 mg   And   diphenhydrAMINE  (BENADRYL ) capsule 50 mg   risperiDONE  (RISPERDAL ) tablet 1 mg   [COMPLETED] cloNIDine  (CATAPRES ) tablet 0.1 mg   PTA Medications  Medication Sig   loperamide  (IMODIUM ) 2 MG capsule Take 1 capsule (2 mg total) by mouth 4 (four) times daily as needed for diarrhea or loose stools.      Medical Decision Making  Admission to Observation unit. Reevaluate in AM to determine disposition Agitation protocol Maalox 30 ml  PO Q 4 PRN Acetaminophen  650 mg PO Q 6 PRN   Milk of Magnesia 30 ml PO Daily PRN Trazodone  50 MP PO HS PRN Risperidone  1 mg PO BID  Recommendations  Based on my evaluation the patient does not appear to have an emergency medical condition.  [name], NP 04/17/24  10:35 PM

## 2024-04-17 NOTE — ED Notes (Signed)
 Asked pt about chief complaint of abdominal pain, he stated he doesn't have abdominal pain
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
## 2024-04-17 NOTE — ED Provider Notes (Signed)
 [place] EMERGENCY DEPARTMENT AT [facility] Provider Note   CSN: [id number] Arrival date & time: 04/16/24  2322     Patient presents with: Abdominal Pain   Aaron Rubio is a 43 y.o. male.   HPI     This is a 43 year old male who presented initially with concerns he had food poisoning.  Patient reports he has had diarrhea and thinks he may have eaten some bad [place] food.  No abdominal pain or nausea or vomiting.  No known sick contacts or anyone else sick that he knows of.  This is his third visit in less than 24 hours.  Currently he is homeless.  Prior to Admission medications   Medication Sig Start Date End Date Taking? Authorizing Provider  loperamide  (IMODIUM ) 2 MG capsule Take 1 capsule (2 mg total) by mouth 4 (four) times daily as needed for diarrhea or loose stools. 04/17/24  Yes Decklyn Hyder, Charmaine FALCON, MD    Allergies: Patient has no known allergies.    Review of Systems  Constitutional:  Negative for fever.  Respiratory:  Negative for shortness of breath.   Cardiovascular:  Negative for chest pain.  Gastrointestinal:  Positive for diarrhea. Negative for abdominal pain, nausea and vomiting.  All other systems reviewed and are negative.   Updated Vital Signs BP (!) 157/108 (BP Location: Left Arm)   Pulse 86   Temp 98.4 F (36.9 C) (Oral)   Resp 18   SpO2 99%   Physical Exam Vitals and nursing note reviewed.  Constitutional:      Appearance: He is well-developed.  HENT:     Head: Normocephalic and atraumatic.     Mouth/Throat:     Mouth: Mucous membranes are dry.     Comments: Chapped lips Eyes:     Pupils: Pupils are equal, round, and reactive to light.  Cardiovascular:     Rate and Rhythm: Normal rate and regular rhythm.     Heart sounds: Normal heart sounds. No murmur heard. Pulmonary:     Effort: Pulmonary effort is normal. No respiratory distress.     Breath sounds: Normal breath sounds. No wheezing.  Abdominal:     General:  Bowel sounds are normal.     Palpations: Abdomen is soft.     Tenderness: There is no abdominal tenderness. There is no rebound.  Musculoskeletal:     Cervical back: Neck supple.  Lymphadenopathy:     Cervical: No cervical adenopathy.  Skin:    General: Skin is warm and dry.  Neurological:     Mental Status: He is alert and oriented to person, place, and time.  Psychiatric:        Mood and Affect: Mood normal.     (all labs ordered are listed, but only abnormal results are displayed) Labs Reviewed - No data to display  EKG: None  Radiology: No results found.   Procedures   Medications Ordered in the ED - No data to display                                  Medical Decision Making Risk Prescription drug management.   This patient presents to the ED for concern of diarrhea, this involves an extensive number of treatment options, and is a complaint that carries with it a high risk of complications and morbidity.  I considered the following differential and admission for this acute, potentially life threatening condition.  The differential diagnosis includes foodborne illness, viral illness, less likely colitis  MDM:    This is a 43 year old male who presents complaining of diarrhea.  Nontoxic and vital signs reassuring with exception of a blood pressure 157/108.  He is currently homeless.  He was seen and evaluated twice earlier in the last 24 hours for other complaints.  At that time lab work was reviewed and is largely reassuring.  His abdominal exam is benign.  He is requesting food.  Do not feel he needs further workup at this time given benign exam and recent normal lab work.  (Labs, imaging, consults)  Labs: I Ordered, and personally interpreted labs.  The pertinent results include: None  Imaging Studies ordered: I ordered imaging studies including none I independently visualized and interpreted imaging. I agree with the radiologist interpretation  Additional  history obtained from chart review.  External records from outside source obtained and reviewed including prior evaluations  Cardiac Monitoring: The patient was not maintained on a cardiac monitor.  If on the cardiac monitor, I personally viewed and interpreted the cardiac monitored which showed an underlying rhythm of: N/A  Reevaluation: After the interventions noted above, I reevaluated the patient and found that they have :stayed the same  Social Determinants of Health:  homeless  Disposition: Discharge  Co morbidities that complicate the patient evaluation  Past Medical History:  Diagnosis Date   Hypertension    Schizophrenia (HCC)      Medicines Meds ordered this encounter  Medications   loperamide  (IMODIUM ) 2 MG capsule    Sig: Take 1 capsule (2 mg total) by mouth 4 (four) times daily as needed for diarrhea or loose stools.    Dispense:  12 capsule    Refill:  0    I have reviewed the patients home medicines and have made adjustments as needed  Problem List / ED Course: Problem List Items Addressed This Visit   None Visit Diagnoses       Diarrhea, unspecified type    -  Primary                Final diagnoses:  Diarrhea, unspecified type    ED Discharge Orders          Ordered    loperamide  (IMODIUM ) 2 MG capsule  4 times daily PRN        04/17/24 0233               [name], MD 04/17/24 [phone]

## 2024-04-17 NOTE — Progress Notes (Signed)
   04/17/24 1944  [facility] Triage Screening (Walk-ins at [facility] only)  How Did You Hear About Us ? Self  What Is the Reason for Your Visit/Call Today? Patient reports he came here for assistance to get to [place] . Patient was not making any sense in his explanation of going to [place] . He states Its a musuem problem, and a problem with my brother, It's like a Transport planner issue. Patient was able to answer safety questions. He reports passive SI/HI but denies plans or intent to harm himself or anyone else. Patient is well known to the behavioral health system and was seen today by a provider at [facility]. He has been seen at various hospitals for various chief complaints every day this week. Patient appears to be at his baseline. He reports chronic auditory hallucinations. He is not compliant with medicaitons and is not established with outpatient therapy or psychiatry services per his report. Patient is homeless and reports smoking synthetic marijuana from the smoke store.  How Long Has This Been Causing You Problems? > than 6 months  Have You Recently Had Any Thoughts About Hurting Yourself? Yes  How long ago did you have thoughts about hurting yourself? this week, unable to pinpoint exact date  Are You Planning to Commit Suicide/Harm Yourself At This time? No  Have you Recently Had Thoughts About Hurting Someone [name]? Yes  How long ago did you have thoughts of harming others? this week  Are You Planning To Harm Someone At This Time? No  Explanation: Pt denies plan or intent to harm anyone  Physical Abuse Denies  Verbal Abuse Denies  Sexual Abuse Denies  Exploitation of patient/patient's resources Denies  Self-Neglect Denies  Possible abuse reported to: Other (Comment) (n/a)  Are you currently experiencing any auditory, visual or other hallucinations? Yes  Please explain the hallucinations you are currently experiencing: reports chronic auditory hallucinations  Have You Used Any  Alcohol or Drugs in the Past 24 Hours? Yes  What Did You Use and How Much? synthethic marijuana from the smoke store yesterday  Do you have any current medical co-morbidities that require immediate attention? No  Clinician description of patient physical appearance/behavior: responding to internal stimuli, calm, cooperative  What Do You Feel Would Help You the Most Today? Housing Assistance;Social Support;Treatment for Depression or other mood problem  If access to [facility] was not available, would you have sought care in the Emergency Department? Yes  Determination of Need Urgent (48 hours)  Options For Referral Medication Management;Outpatient Therapy  Determination of Need filed? Yes
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
## 2024-04-17 NOTE — Discharge Instructions (Signed)
 You are seen today for diarrhea.  Your labs from earlier today were reassuring.  Take Imodium  if needed.

## 2024-04-17 NOTE — ED Notes (Signed)
 PO challenge complete with no issues, PT drank over 3 oz of water with no reports of nausea

## 2024-04-17 NOTE — ED Notes (Signed)
 Pt resting at this hour. No apparent distress. RR even and unlabored. Monitored for safety.

## 2024-04-18 ENCOUNTER — Emergency Department (HOSPITAL_COMMUNITY)
Admission: EM | Admit: 2024-04-18 | Discharge: 2024-04-19 | Disposition: A | Discharge status: Home / Self Care | Attending: Emergency Medicine | Admitting: Emergency Medicine

## 2024-04-18 ENCOUNTER — Other Ambulatory Visit (HOSPITAL_COMMUNITY): Payer: Self-pay

## 2024-04-18 DIAGNOSIS — R45851 Suicidal ideations: Secondary | ICD-10-CM | POA: Diagnosis not present

## 2024-04-18 DIAGNOSIS — Z5900 Homelessness unspecified: Secondary | ICD-10-CM | POA: Insufficient documentation

## 2024-04-18 DIAGNOSIS — Z8659 Personal history of other mental and behavioral disorders: Secondary | ICD-10-CM

## 2024-04-18 DIAGNOSIS — I10 Essential (primary) hypertension: Secondary | ICD-10-CM | POA: Diagnosis not present

## 2024-04-18 DIAGNOSIS — R443 Hallucinations, unspecified: Secondary | ICD-10-CM | POA: Diagnosis present

## 2024-04-18 DIAGNOSIS — R440 Auditory hallucinations: Secondary | ICD-10-CM | POA: Diagnosis present

## 2024-04-18 DIAGNOSIS — F259 Schizoaffective disorder, unspecified: Secondary | ICD-10-CM | POA: Diagnosis not present

## 2024-04-18 DIAGNOSIS — F1220 Cannabis dependence, uncomplicated: Secondary | ICD-10-CM | POA: Diagnosis not present

## 2024-04-18 DIAGNOSIS — F1420 Cocaine dependence, uncomplicated: Secondary | ICD-10-CM | POA: Diagnosis not present

## 2024-04-18 MED ORDER — RISPERIDONE 1 MG PO TABS
1.0000 mg | ORAL_TABLET | Freq: Two times a day (BID) | ORAL | 0 refills | Status: DC
Start: 2024-04-18 — End: 2024-05-02

## 2024-04-18 NOTE — ED Notes (Signed)
 PT report seeing his brother and hold friends, was not able to explain the content of hallucinations. Flight of ideas, rapid change of topics. Calm and cooperative. Requested pill that dissolve under tongue.

## 2024-04-18 NOTE — Discharge Summary (Addendum)
 Aaron Rubio to be discharged Home per MD order. Pt refused Tylenol  when offered. An After Visit Summary was printed and given to the patient. All belongings returned. Patient escorted out and discharged home via private auto.  [name]  04/18/2024 10:30 AM

## 2024-04-18 NOTE — Discharge Instructions (Addendum)
 Base on the information you have provided and the presenting issue, outpatient services and resources for have been recommended.  It is imperative that you follow through with treatment recommendations within 5-7 days from the of discharge to mitigate further risk to your safety and mental well-being. A list of referrals has been provided below to get you started.  You are not limited to the list provided.  In case of an urgent crisis, you may contact the Mobile Crisis Unit with [facility] at [phone].    [facility] [address], [place], [address] [phone] phone   New Patient Assessment/Therapy Walk-Ins:  Monday and Wednesday: 8 am until slots are full. Every 1st and 2nd Fridays of the month: 1 pm - 5 pm.  NO ASSESSMENT/THERAPY WALK-INS ON TUESDAYS OR THURSDAYS  New Patient Assessment/Medication Management Walk-Ins:  Monday - Friday:  8 am - 11 am.  For all walk-ins, we ask that you arrive by 7:00 am because patients will be seen in the order of arrival.  Availability is limited; therefore, you may not be seen on the same day that you walk-in.  Our goal is to serve and meet the needs of our community to the best of our ability.

## 2024-04-18 NOTE — ED Triage Notes (Addendum)
 The pt is hearing and seeing things

## 2024-04-18 NOTE — ED Notes (Signed)
 Pt asleep at this hour. No apparent distress. RR even and unlabored. Monitored for safety.

## 2024-04-18 NOTE — Progress Notes (Addendum)
 Pt is currently asleep. Respirations are even and unlabored. No signs of acute distress noted. Pt woke up and had breakfast. Refused AM medication.  Pt is demanding and argumentative. Staff will monitor for pt's safety.

## 2024-04-18 NOTE — ED Provider Notes (Signed)
 FBC/OBS ASAP Discharge Summary  Date and Time: 04/18/2024 1:47 PM  Name: Aaron Rubio  MRN:  [id number]   Discharge Diagnoses:  Final diagnoses:  Schizophrenia, paranoid (HCC)  Malingering    Aaron Rubio is a 43 y.o. male patient with a documented history significant for cocaine use, paranoid schizophrenia, MDD, substance-induced mood disorder, homelessness and malingering who presented to the  [facility] voluntarily, unaccompanied , complaining of HI, without plan or intent and VH. He reported  experiencing jerking like movement and talking to himself. Pt reported  VH and sees an indvidual sitting with their legs folded. Patient rambles about various topics and could not stay focused on some of the questions posed. He reported being prescribed medication Risperdal  and Trazadone, but does not remember the last time he has taken medication. He was admitted to the observation unit for reassessment.   Subjective:   On morning assessment, patient was approached on the obs unit.  Patient reports some discharge this morning.  Reports that he came in last night because his thoughts were disorganized and he needed a place to stay.  Patient denies suicidal ideations, homicidal ideations or auditory visual hallucinations.  Patient reports right medicine sent to [facility] on [facility].  Voiced understanding about medication management and appointments at the [facility] behavioral health center.  During my interview, patient did not appear to be responding to internal stimuli.  On the unit, patiently cooperative with provider, and intermittently irritable and demanding with food to nursing staff.    Stay Summary:  Patient was admitted overnight on 10/16 voluntarily for symptoms consistent with psychosis.  Patient was reassessed on 10/17 where he denied auditory visual hallucinations.  Patient was discharged with outpatient resources and a short course of Risperdal  for medication.  Patient  voiced understanding about access to resources.  Medication sent to preferred pharmacy.  While on the unit, the patient did not require any as needed agitation protocol.  Patient was intermittently irritable and demanding with staff, however was not physically aggressive or attempting to elope.  Total Time spent with patient: 30 minutes  Past Psychiatric History: a history of cocaine use, paranoid schizophrenia, MDD, substance-induced mood disorder, homelessness and malingering. Last inpatient psych hospitalization on 12/23/23.   Previous Psychotropic Medications: Yes  Prozac , Seroquel , Zyprexa , Trazodone , Hydroxyzine , Gabapentin     Is the patient at risk to self? No  Has the patient been a risk to self in the past 6 months? No .    Has the patient been a risk to self within the distant past? Yes  Is the patient a risk to others? No   Has the patient been a risk to others in the past 6 months? No   Has the patient been a risk to others within the distant past? No    Past Medical History: a documented history of HTN    Family History: No known history reported.    Social History: Homelessness. Unemployed. Cocaine use.  Tobacco Cessation:  A prescription for an FDA-approved tobacco cessation medication was offered at discharge and the patient refused  Current Medications:  Current Facility-Administered Medications  Medication Dose Route Frequency Provider Last Rate Last Admin   acetaminophen  (TYLENOL ) tablet 650 mg  650 mg Oral Q6H PRN Randall Starlyn HERO, NP       alum & mag hydroxide-simeth (MAALOX/MYLANTA) 200-200-20 MG/5ML suspension 30 mL  30 mL Oral Q4H PRN Randall, Veronique M, NP       haloperidol  (HALDOL ) tablet 5 mg  5 mg Oral TID PRN Randall Starlyn HERO, NP       And   diphenhydrAMINE  (BENADRYL ) capsule 50 mg  50 mg Oral TID PRN Randall Starlyn HERO, NP       haloperidol  lactate (HALDOL ) injection 5 mg  5 mg Intramuscular TID PRN Randall Starlyn HERO, NP       And    diphenhydrAMINE  (BENADRYL ) injection 50 mg  50 mg Intramuscular TID PRN Randall Starlyn HERO, NP       And   LORazepam  (ATIVAN ) injection 2 mg  2 mg Intramuscular TID PRN Randall Starlyn HERO, NP       haloperidol  lactate (HALDOL ) injection 10 mg  10 mg Intramuscular TID PRN Randall Starlyn HERO, NP       And   diphenhydrAMINE  (BENADRYL ) injection 50 mg  50 mg Intramuscular TID PRN Randall Starlyn HERO, NP       And   LORazepam  (ATIVAN ) injection 2 mg  2 mg Intramuscular TID PRN Randall Starlyn HERO, NP       magnesium  hydroxide (MILK OF MAGNESIA) suspension 30 mL  30 mL Oral Daily PRN Randall, Veronique M, NP       risperiDONE  (RISPERDAL ) tablet 1 mg  1 mg Oral BID Byungura, Veronique M, NP   1 mg at 04/17/24 2113   traZODone  (DESYREL ) tablet 50 mg  50 mg Oral QHS PRN Byungura, Veronique M, NP   50 mg at 04/17/24 2113   Current Outpatient Medications  Medication Sig Dispense Refill   risperiDONE  (RISPERDAL ) 1 MG tablet Take 1 tablet (1 mg total) by mouth 2 (two) times daily. 7 tablet 0    PTA Medications:  PTA Medications  Medication Sig   risperiDONE  (RISPERDAL ) 1 MG tablet Take 1 tablet (1 mg total) by mouth 2 (two) times daily.   Facility Ordered Medications  Medication   acetaminophen  (TYLENOL ) tablet 650 mg   alum & mag hydroxide-simeth (MAALOX/MYLANTA) 200-200-20 MG/5ML suspension 30 mL   magnesium  hydroxide (MILK OF MAGNESIA) suspension 30 mL   haloperidol  lactate (HALDOL ) injection 5 mg   And   diphenhydrAMINE  (BENADRYL ) injection 50 mg   And   LORazepam  (ATIVAN ) injection 2 mg   haloperidol  lactate (HALDOL ) injection 10 mg   And   diphenhydrAMINE  (BENADRYL ) injection 50 mg   And   LORazepam  (ATIVAN ) injection 2 mg   traZODone  (DESYREL ) tablet 50 mg   haloperidol  (HALDOL ) tablet 5 mg   And   diphenhydrAMINE  (BENADRYL ) capsule 50 mg   risperiDONE  (RISPERDAL ) tablet 1 mg   [COMPLETED] cloNIDine  (CATAPRES ) tablet 0.1 mg       04/17/2024    8:17 PM 04/16/2024     9:59 AM  Depression screen PHQ 2/9  Decreased Interest -- 1  Down, Depressed, Hopeless -- 1  PHQ - 2 Score  2  Altered sleeping  1  Tired, decreased energy  0  Change in appetite  0  Feeling bad or failure about yourself   0  Trouble concentrating  1  Moving slowly or fidgety/restless  0  Suicidal thoughts  0  PHQ-9 Score  4  Difficult doing work/chores  Somewhat difficult    Flowsheet Row ED from 04/17/2024 in [facility] Most recent reading at 04/17/2024 11:12 PM ED from 04/16/2024 in [facility] Emergency Department at [facility] Most recent reading at 04/16/2024 11:43 PM ED from 04/16/2024 in [facility] Emergency Department at [facility] Most recent reading at 04/16/2024  8:06 PM  C-SSRS RISK CATEGORY Low Risk No Risk No Risk    Musculoskeletal  Strength & Muscle Tone: within normal limits Gait & Station: normal Patient leans: N/A  Psychiatric Specialty Exam  Presentation  General Appearance:  Appropriate for Environment  Eye Contact: Fair  Speech: Clear and Coherent  Speech Volume: Normal  Handedness: Right   Mood and Affect  Mood: Euthymic  Affect: Labile   Thought Process  Thought Processes: Coherent  Descriptions of Associations:Intact  Orientation:Full (Time, Place and Person)  Thought Content:Logical  Diagnosis of Schizophrenia or Schizoaffective disorder in past: Yes  Duration of Psychotic Symptoms: Greater than six months   Hallucinations:Hallucinations: None  Ideas of Reference:None  Suicidal Thoughts:Suicidal Thoughts: No  Homicidal Thoughts:Homicidal Thoughts: No   Sensorium  Memory: Immediate Poor; Recent Poor  Judgment: Poor  Insight: Poor   Executive Functions  Concentration: Fair  Attention Span: Fair  Recall: Fair  Fund of Knowledge: Fair  Language: Fair   Psychomotor Activity  Psychomotor Activity: Psychomotor Activity: Normal   Assets   Assets: Desire for Improvement   Sleep  Sleep: Sleep: Fair  No Safety Checks orders active in given range  Nutritional Assessment (For OBS and FBC admissions only) Has the patient had a weight loss or gain of 10 pounds or more in the last 3 months?: No Has the patient had a decrease in food intake/or appetite?: No Does the patient have dental problems?: No Does the patient have eating habits or behaviors that may be indicators of an eating disorder including binging or inducing vomiting?: No Has the patient recently lost weight without trying?: 0 Has the patient been eating poorly because of a decreased appetite?: 0 Malnutrition Screening Tool Score: 0    Physical Exam  Physical Exam Constitutional:      Appearance: Normal appearance.  Pulmonary:     Effort: Pulmonary effort is normal.  Neurological:     Mental Status: He is oriented to person, place, and time.    Review of Systems  Constitutional:  Negative for chills, diaphoresis and fever.  Respiratory:  Negative for cough.   Gastrointestinal:  Negative for nausea and vomiting.  Psychiatric/Behavioral:  Negative for depression, hallucinations, substance abuse and suicidal ideas. The patient is not nervous/anxious and does not have insomnia.    Blood pressure 109/66, pulse 68, temperature 98.4 F (36.9 C), temperature source Oral, resp. rate 18, SpO2 100%. There is no height or weight on file to calculate BMI.  Demographic Factors:  Male, Divorced or widowed, Low socioeconomic status, and Living alone   Loss Factors: Decline in physical health and Financial problems/change in socioeconomic status   Historical Factors: History of polysubstance use    Risk Reduction Factors:   NA   Continued Clinical Symptoms:  Alcohol/Substance Abuse/Dependencies Schizophrenia:   Paranoid or undifferentiated type Unstable or Poor Therapeutic Relationship Previous Psychiatric Diagnoses and Treatments   Cognitive Features  That Contribute To Risk:  Lack of insight    Suicide Risk:  Minimal: No identifiable suicidal ideation.  Patients presenting with no risk factors but with morbid ruminations; may be classified as minimal risk based on the severity of the depressive symptoms   Plan Of Care/Follow-up recommendations:    Activity: as tolerated   Diet: heart healthy   Other: -Follow-up with your outpatient psychiatric provider -instructions on appointment date, time, and address (location) are provided to you in discharge paperwork.   -Take your psychiatric medications as prescribed at discharge - instructions are provided to you in the discharge  paperwork   -Follow-up with outpatient primary care doctor and other specialists -for management of preventative medicine and chronic medical disease   -Testing: Follow-up with outpatient provider for abnormal lab results:  UDS:   -If you are prescribed an atypical antipsychotic medication, we recommend that your outpatient psychiatrist follow routine screening for side effects within 3 months of discharge, including monitoring: AIMS scale, height, weight, blood pressure, fasting lipid panel, HbA1c, and fasting blood sugar.    -Recommend total abstinence from alcohol, tobacco, and other illicit drug use at discharge.    -If your psychiatric symptoms recur, worsen, or if you have side effects to your psychiatric medications, call your outpatient psychiatric provider, 911, [phone] or go to the nearest emergency department.   -If suicidal thoughts occur, immediately call your outpatient psychiatric provider, 911, [phone] or go to the nearest emergency department.     Disposition: Self care   PATTI OLDEN, MD 04/18/2024, 1:47 PM

## 2024-04-19 ENCOUNTER — Encounter (HOSPITAL_COMMUNITY): Payer: Self-pay | Admitting: Emergency Medicine

## 2024-04-19 ENCOUNTER — Emergency Department (EMERGENCY_DEPARTMENT_HOSPITAL)
Admission: EM | Admit: 2024-04-19 | Discharge: 2024-04-21 | Disposition: A | Discharge status: Home / Self Care | Source: Home / Self Care | Attending: Emergency Medicine | Admitting: Emergency Medicine

## 2024-04-19 DIAGNOSIS — I10 Essential (primary) hypertension: Secondary | ICD-10-CM | POA: Insufficient documentation

## 2024-04-19 DIAGNOSIS — F1420 Cocaine dependence, uncomplicated: Secondary | ICD-10-CM | POA: Insufficient documentation

## 2024-04-19 DIAGNOSIS — F250 Schizoaffective disorder, bipolar type: Secondary | ICD-10-CM | POA: Diagnosis present

## 2024-04-19 DIAGNOSIS — R45851 Suicidal ideations: Secondary | ICD-10-CM | POA: Insufficient documentation

## 2024-04-19 DIAGNOSIS — F1220 Cannabis dependence, uncomplicated: Secondary | ICD-10-CM | POA: Insufficient documentation

## 2024-04-19 DIAGNOSIS — Z5900 Homelessness unspecified: Secondary | ICD-10-CM | POA: Insufficient documentation

## 2024-04-19 DIAGNOSIS — F259 Schizoaffective disorder, unspecified: Secondary | ICD-10-CM | POA: Diagnosis present

## 2024-04-19 DIAGNOSIS — F1410 Cocaine abuse, uncomplicated: Secondary | ICD-10-CM | POA: Diagnosis present

## 2024-04-19 NOTE — ED Triage Notes (Signed)
 Pt here multiple times fro same , hearing voices mostly at night he states , NO SI/HI , pt is homeless

## 2024-04-19 NOTE — ED Provider Notes (Signed)
 [place] EMERGENCY DEPARTMENT AT [facility] Provider Note   CSN: [id number] Arrival date & time: 04/18/24  2016     Patient presents with: seeing things   Aaron Rubio is a 43 y.o. male patient with past medical history of cocaine use disorder with dependence, polysubstance abuse, homelessness, malingering, schizoaffective disorder presents to emergency room with complaint of hallucinations.  Patient reports that he always has hallucinations and he has not had any acute changes.  He denies SI or HI.  He does not want to speak to behavioral health about this and he reports he has been taking his medication.  He has no other complaints and he is requesting to be discharged without further workup after resting in the ER waiting room last night.    HPI     Prior to Admission medications   Medication Sig Start Date End Date Taking? Authorizing Provider  risperiDONE  (RISPERDAL ) 1 MG tablet Take 1 tablet (1 mg total) by mouth 2 (two) times daily. 04/18/24   Lenard Calin, MD    Allergies: Patient has no known allergies.    Review of Systems  Constitutional:  Positive for activity change.    Updated Vital Signs BP (!) 158/107   Pulse 65   Temp 97.9 F (36.6 C)   Resp 18   SpO2 99%   Physical Exam Vitals and nursing note reviewed.  Constitutional:      General: He is not in acute distress.    Appearance: He is not toxic-appearing.  HENT:     Head: Normocephalic and atraumatic.  Eyes:     General: No scleral icterus.    Conjunctiva/sclera: Conjunctivae normal.  Cardiovascular:     Rate and Rhythm: Normal rate and regular rhythm.     Pulses: Normal pulses.     Heart sounds: Normal heart sounds.  Pulmonary:     Effort: Pulmonary effort is normal. No respiratory distress.     Breath sounds: Normal breath sounds.  Abdominal:     General: Abdomen is flat. Bowel sounds are normal.     Palpations: Abdomen is soft.     Tenderness: There is no abdominal  tenderness.  Musculoskeletal:     Right lower leg: No edema.     Left lower leg: No edema.  Skin:    General: Skin is warm and dry.     Findings: No lesion.  Neurological:     General: No focal deficit present.     Mental Status: He is alert and oriented to person, place, and time. Mental status is at baseline.     (all labs ordered are listed, but only abnormal results are displayed) Labs Reviewed - No data to display  EKG: None  Radiology: No results found.   Procedures   Medications Ordered in the ED - No data to display                                  Medical Decision Making  This patient presents to the ED for concern of hallucinations, this involves an extensive number of treatment options, and is a complaint that carries with it a high risk of complications and morbidity.  The differential diagnosis includes acute psychosis, hallucinations, medication side effect, depression, anxiety, suicidal ideation, homicidal ideation   Co morbidities that complicate the patient evaluation  Schizophrenia   Additional history obtained:  Additional history obtained from I did review patient's  behavioral health urgent care visit from 2 days ago.  Patient was evaluated at behavioral health urgent care and he had similar complaints.  He said no change since their evaluation and he was deemed psychiatrically cleared.  At that time patient was recommended to follow-up with outpatient psychiatry.    Lab Tests:  I personally interpreted labs.  The pertinent results include:   N/A   Problem List / ED Course / Critical interventions / Medication management  Patient presented to emergency room with complaint of having hallucinations.  Patient reports this is not new for him.  He denies any suicidal ideation or homicidal ideation.  He denies any visual hallucination.  On my exam he does not seem to be responding to any internal stimuli.  He is cooperative with exam.  As he is not  having any acute psychosis, suicidal ideation or homicidal ideation I feel that patient is stable for discharge home without further workup.  He does not want to speak to psychiatry and is agreeable to outpatient follow-up. I have reviewed the patients home medicines and have made adjustments as needed. Patient is hemodynamically stable and in no acute distress.  I feel he is appropriate for discharge with outpatient follow-up.  He was given return precautions.        Final diagnoses:  History of schizophrenia    ED Discharge Orders     None          [name], PA-C 04/19/24 1048    Rogelia Jerilynn RAMAN, MD 04/19/24 1133

## 2024-04-19 NOTE — Discharge Instructions (Signed)
 Please make sure you are taking your medications as prescribed. I would recommend following up with behavioral health urgent care if needed. Return to emergency room with any new or worsening symptoms.

## 2024-04-20 DIAGNOSIS — F259 Schizoaffective disorder, unspecified: Secondary | ICD-10-CM | POA: Diagnosis not present

## 2024-04-20 DIAGNOSIS — F1220 Cannabis dependence, uncomplicated: Secondary | ICD-10-CM | POA: Diagnosis not present

## 2024-04-20 DIAGNOSIS — F1420 Cocaine dependence, uncomplicated: Secondary | ICD-10-CM | POA: Diagnosis not present

## 2024-04-20 LAB — CBC WITH DIFFERENTIAL/PLATELET
Abs Immature Granulocytes: 0.03 K/uL (ref 0.00–0.07)
Basophils Absolute: 0.1 K/uL (ref 0.0–0.1)
Basophils Relative: 1 %
Eosinophils Absolute: 0.4 K/uL (ref 0.0–0.5)
Eosinophils Relative: 5 %
HCT: 47.2 % (ref 39.0–52.0)
Hemoglobin: 14.9 g/dL (ref 13.0–17.0)
Immature Granulocytes: 0 %
Lymphocytes Relative: 24 %
Lymphs Abs: 1.9 K/uL (ref 0.7–4.0)
MCH: 25.9 pg — ABNORMAL LOW (ref 26.0–34.0)
MCHC: 31.6 g/dL (ref 30.0–36.0)
MCV: 82.1 fL (ref 80.0–100.0)
Monocytes Absolute: 0.7 K/uL (ref 0.1–1.0)
Monocytes Relative: 9 %
Neutro Abs: 4.8 K/uL (ref 1.7–7.7)
Neutrophils Relative %: 61 %
Platelets: 266 K/uL (ref 150–400)
RBC: 5.75 MIL/uL (ref 4.22–5.81)
RDW: 13.6 % (ref 11.5–15.5)
WBC: 7.9 K/uL (ref 4.0–10.5)
nRBC: 0 % (ref 0.0–0.2)

## 2024-04-20 LAB — COMPREHENSIVE METABOLIC PANEL WITH GFR
ALT: 19 U/L (ref 0–44)
AST: 25 U/L (ref 15–41)
Albumin: 3.4 g/dL — ABNORMAL LOW (ref 3.5–5.0)
Alkaline Phosphatase: 50 U/L (ref 38–126)
Anion gap: 9 (ref 5–15)
BUN: 12 mg/dL (ref 6–20)
CO2: 26 mmol/L (ref 22–32)
Calcium: 8.9 mg/dL (ref 8.9–10.3)
Chloride: 101 mmol/L (ref 98–111)
Creatinine, Ser: 1.26 mg/dL — ABNORMAL HIGH (ref 0.61–1.24)
GFR, Estimated: >60 mL/min (ref >60)
Glucose, Bld: 86 mg/dL (ref 70–99)
Potassium: 3.9 mmol/L (ref 3.5–5.1)
Sodium: 136 mmol/L (ref 135–145)
Total Bilirubin: 0.7 mg/dL (ref 0.0–1.2)
Total Protein: 5.8 g/dL — ABNORMAL LOW (ref 6.5–8.1)

## 2024-04-20 LAB — RAPID URINE DRUG SCREEN, HOSP PERFORMED
Amphetamines: NOT DETECTED
Barbiturates: NOT DETECTED
Benzodiazepines: NOT DETECTED
Cocaine: POSITIVE — AB
Opiates: NOT DETECTED
Tetrahydrocannabinol: NOT DETECTED

## 2024-04-20 LAB — ETHANOL: Alcohol, Ethyl (B): <15 mg/dL (ref <15)

## 2024-04-20 MED ORDER — RISPERIDONE 1 MG PO TABS
1.0000 mg | ORAL_TABLET | Freq: Two times a day (BID) | ORAL | Status: DC
  Administered 2024-04-20 – 2024-04-21 (×3): 1 mg via ORAL
  Filled 2024-04-20 (×3): qty 1

## 2024-04-20 MED ORDER — HALOPERIDOL LACTATE 5 MG/ML IJ SOLN: 5.0000 mg | Freq: Two times a day (BID) | INTRAMUSCULAR | Status: DC | PRN

## 2024-04-20 MED ORDER — LORAZEPAM 2 MG/ML IJ SOLN
1.0000 mg | Freq: Two times a day (BID) | INTRAMUSCULAR | Status: DC | PRN
  Administered 2024-04-20: 1 mg via INTRAMUSCULAR
  Filled 2024-04-20: qty 1

## 2024-04-20 NOTE — ED Notes (Signed)
 Pt to rm 47 for TTS.

## 2024-04-20 NOTE — ED Notes (Signed)
 Pt back to hall 20 from TTS.

## 2024-04-20 NOTE — ED Provider Notes (Signed)
 [place] EMERGENCY DEPARTMENT AT [facility] Provider Note   CSN: [id number] Arrival date & time: 04/19/24  1935     Patient presents with: No chief complaint on file.   Aaron Rubio is a 43 y.o. male patient with past medical history of homelessness, cocaine abuse, malingering, schizoaffective disorder presents emergency room with complaint of suicidal ideation.  When asked to elaborate patient reports that he started feeling suicidal yesterday and asked a friend for a gun.  He reports he has access to this gun and he is going to leave and shoot himself in the head.  He also reports that he is homicidal and he is angry that people are making more money than him.  He endorses visual and auditory hallucinations and he is observed speaking to himself in the hallway.   HPI     Prior to Admission medications   Medication Sig Start Date End Date Taking? Authorizing Provider  risperiDONE  (RISPERDAL ) 1 MG tablet Take 1 tablet (1 mg total) by mouth 2 (two) times daily. 04/18/24   Lenard Calin, MD    Allergies: Patient has no known allergies.    Review of Systems  Eyes:  Positive for visual disturbance.    Updated Vital Signs BP (!) 145/97 (BP Location: Right Arm)   Pulse 60   Temp 98.1 F (36.7 C)   Resp 17   SpO2 100%   Physical Exam Vitals and nursing note reviewed.  Constitutional:      General: He is not in acute distress.    Appearance: He is not toxic-appearing.  HENT:     Head: Normocephalic and atraumatic.  Eyes:     General: No scleral icterus.    Conjunctiva/sclera: Conjunctivae normal.  Cardiovascular:     Rate and Rhythm: Normal rate and regular rhythm.     Pulses: Normal pulses.     Heart sounds: Normal heart sounds.  Pulmonary:     Effort: Pulmonary effort is normal. No respiratory distress.     Breath sounds: Normal breath sounds.  Abdominal:     General: Abdomen is flat. Bowel sounds are normal.     Palpations: Abdomen is soft.      Tenderness: There is no abdominal tenderness.  Musculoskeletal:     Right lower leg: No edema.     Left lower leg: No edema.  Skin:    General: Skin is warm and dry.     Findings: No lesion.  Neurological:     General: No focal deficit present.     Mental Status: He is alert and oriented to person, place, and time. Mental status is at baseline.     (all labs ordered are listed, but only abnormal results are displayed) Labs Reviewed  COMPREHENSIVE METABOLIC PANEL WITH GFR  ETHANOL  RAPID URINE DRUG SCREEN, HOSP PERFORMED  CBC WITH DIFFERENTIAL/PLATELET    EKG: None  Radiology: No results found.   Procedures   Medications Ordered in the ED - No data to display                                  Medical Decision Making Amount and/or Complexity of Data Reviewed Labs: ordered.   This patient presents to the ED for concern of suicidal ideation, this involves an extensive number of treatment options, and is a complaint that carries with it a high risk of complications and morbidity.  The differential diagnosis includes  suicidal ideation, homicidal ideation, acute psychosis, malingering   Co morbidities that complicate the patient evaluation  Malingering, homelessness, schizoaffective disorder   Additional history obtained:  Additional history obtained from patient is well-known to emergency department and recently evaluated by behavioral health on the 16th of this month Patient was seen by me yesterday in the emergency room   Lab Tests:  I personally interpreted labs.  The pertinent results include:   Basic labs obtained for medical clearance   Cardiac Monitoring: / EKG:  The patient was maintained on a cardiac monitor.  I personally viewed and interpreted the cardiac monitored which showed an underlying rhythm of: Sinus with first-degree AV block   Consultations Obtained:  I requested consultation with the behavioral health,  and discussed lab and imaging  findings as well as pertinent plan.  Patient medically cleared to speak to behavioral health   Problem List / ED Course / Critical interventions / Medication management  Patient presenting to emergency room with complaint of hallucinations of both auditory and visual.  He reports he has been ongoing for some time.  He does note that he has suicidal ideation.  Medically stable and well-appearing.  He is cooperative with exam.  Does not appear withdrawn.  Was observed speaking to himself in the hallway prior to evaluation.  Vitals are stable.  Lab work has been obtained EKG obtained for medical clearance.  I have reviewed the patients home medicines and have made adjustments as needed. Patient is expressing suicidal ideation with plan and he is concerned that he has an access to a gun.  I have placed consult to behavioral health.  He is agreeable to stay and he is requesting treatment.  Medically cleared.        Final diagnoses:  Suicidal ideation    ED Discharge Orders     None          [name], PA-C 04/20/24 [id number]    [name], MD 04/23/24 1102

## 2024-04-20 NOTE — ED Notes (Signed)
 Pt is ambulatory in department.  No distress, used bathroom.

## 2024-04-20 NOTE — Consult Note (Signed)
 [facility] Psychiatric Consult Initial  Patient Name: .Aaron Rubio  MRN: [id number]  DOB: 16-Jun-1981  Consult Order details:  Orders (From admission, onward)     Start     Ordered   04/20/24 0821  CONSULT TO CALL ACT TEAM       Ordering Provider: [name], PA-C  Provider:  (Not yet assigned)  Question:  Reason for Consult?  Answer:  Psych consult   04/20/24 0821             Mode of Visit: Tele-visit Virtual Statement:TELE PSYCHIATRY ATTESTATION & CONSENT As the provider for this telehealth consult, I attest that I verified the patient's identity using two separate identifiers, introduced myself to the patient, provided my credentials, disclosed my location, and performed this encounter via a HIPAA-compliant, real-time, face-to-face, two-way, interactive audio and video platform and with the full consent and agreement of the patient (or guardian as applicable.) Patient physical location: [facility] Emergency Department. Telehealth provider physical location: home office in state of [place].   Video start time: 1245 Video end time: 1330    Psychiatry Consult Evaluation  Service Date: April 20, 2024 LOS:  LOS: 0 days  Chief Complaint I don't know what the religion and sexuality has to do with it.  Primary Psychiatric Diagnoses  Schizoaffective Disorder 2.  Cocaine use disorder, severe dependence 3.  Cannabis use disorder, severe dependence  Assessment  Aaron Rubio is a 43 y.o. male admitted: Presented to the EDfor 04/19/2024  8:15 PM for  Per ED Provider Note dated 04/18/2024@2016 : Patient presents with: seeing things   Aaron Rubio is a 43 y.o. male patient with past medical history of cocaine use disorder with dependence, polysubstance abuse, homelessness, malingering, schizoaffective disorder presents to emergency room with complaint of hallucinations.  Patient reports that he always has hallucinations and he has not had any acute changes.  He  denies SI or HI. He does not want to speak to behavioral health about this and he reports he has been taking his medication.  He has no other complaints and he is requesting to be discharged without further workup after resting in the ER waiting room last night.   He carries the psychiatric diagnoses of cocaine use disorder, cannabis use disorder, schizoaffective disorder, malingering, MDD, homelessness and has a past medical history of HTN.   His current presentation of word salad, non-sensible speech, disorientation is most consistent with decompensated schizoaffective disorder, exacerbated by polysubstance usage. He meets criteria for overnight observation based on above.  Current outpatient psychotropic medications include risperidone  and historically he has had a good response to these medications. He was non compliant with medications prior to admission as evidenced by patient reports.   On initial examination, patient is a poor historian and is severely limited by his mental decline. He endorses suicidal ideations and plans to use his friend's gun to end his life.   Recommend overnight observations to allow for metabolism of cocaine, illicit substance; restart psychotropic medications for mood stability. Pending no concerns overnight, can plan to discharge and outpatient follow up.   Please see plan below for detailed recommendations.   Diagnoses:  Active Hospital problems: Principal Problem:   Schizoaffective disorder (HCC) Active Problems:   Cocaine use disorder, severe, dependence (HCC)   Cannabis use disorder, severe, dependence (HCC)    Plan   ## Psychiatric Medication Recommendations:  Restart home medications -Risperidone  1mg  po BID  Agitation Protocol  -Haldol  5mg  IM BID prn and  Ativan  1mg  po BID prn severe anxiety  ## Medical Decision Making Capacity: Not specifically addressed in this encounter  ## Further Work-up:  -- Deferred to ED Provider While pt on Qtc  prolonging medications, please monitor & replete K+ to 4 and Mg2+ to 2 -- most recent EKG on 04/20/2024 had QtC of 416/416 -- Pertinent labwork reviewed earlier this admission includes: CMP, CBC, UDS   ## Disposition:-- Patient is a high utilizer of emergency services but given his presentation for decompensated schizophrenia, medication non-compliance and polysubstance abuse, he is not appropriate for discharge today.  Recommend restarting psychotropic medications and overnight observations and plan for AM discharge with safety plan.    He would benefit from referral to long term drug rehab facility, will discuss with him tomorrow.     ## Behavioral / Environmental: - No specific recommendations at this time.     ## Safety and Observation Level:  - Based on my clinical evaluation, I estimate the patient to be at mow risk of self harm in the current setting. - At this time, we recommend  routine. This decision is based on my review of the chart including patient's history and current presentation, interview of the patient, mental status examination, and consideration of suicide risk including evaluating suicidal ideation, plan, intent, suicidal or self-harm behaviors, risk factors, and protective factors. This judgment is based on our ability to directly address suicide risk, implement suicide prevention strategies, and develop a safety plan while the patient is in the clinical setting. Please contact our team if there is a concern that risk level has changed.  CSSR Risk Category:C-SSRS RISK CATEGORY: High Risk  Suicide Risk Assessment: Patient has following modifiable risk factors for suicide: recklessness, medication noncompliance, triggering events, and recent psychiatric hospitalization, which we are addressing by recommending overnight observation and restarting psychotropic medications. Patient has following non-modifiable or demographic risk factors for suicide: male gender and psychiatric  hospitalization Patient has the following protective factors against suicide: Access to outpatient mental health care  Thank you for this consult request. Recommendations have been communicated to the primary team.  We will sign off  at this time.   Aaron FORBES Barefoot, NP       History of Present Illness  Relevant Aspects of Hospital ED Course:  Admitted on 04/19/2024 for   Per RN Triage Note dated 04/19/2024@2022 : Pt here multiple times fro same , hearing voices mostly at night he states , NO SI/HI , pt is homeless   Patient Report:  Patient observed seated on bed, hob elevated.  He's fairly groomed and dressed in hospital scrubs; He appears tired, and withdrawn but is cooperative.  Patient's speech is non-sensible and at times slurred.  He is disoriented and has difficulty providing historical information.  He does appears preoccupied with religion, and his sexuality.  Patient does report being prescribed risperidone  and trazodone  but states he's homeless so cannot take it.  When asked about use of street drugs he is guarded but does nod his head yes when asked about marijuana and cocaine usage.  He also reports alcohol use but does not elaborate.  Patient is severely mentally decompensated, and has trouble participating in assessment. Considering this, additional questions deferred for now.   Psych ROS:  Depression: yes Anxiety:  yes Mania (lifetime and current): no Psychosis: (lifetime and current): past and present  Collateral information:  deferred  Review of Systems  Constitutional: Negative.   HENT: Negative.    Eyes: Negative.  Respiratory: Negative.    Cardiovascular: Negative.   Gastrointestinal: Negative.   Genitourinary: Negative.   Musculoskeletal: Negative.   Skin: Negative.   Neurological: Negative.   Endo/Heme/Allergies: Negative.   Psychiatric/Behavioral:  Positive for substance abuse. The patient has insomnia.      Psychiatric and Social History   Psychiatric History:  Information collected from patient and chart review  Prev Dx/Sx: depression, polysubstance abuse Current Psych Provider: Merrily, had appt pending for 04/18/2024 but he did not attend. Home Meds (current): as listed above Previous Med Trials: unknown Therapy: no  Prior Psych Hospitalization: yes, most recent one month prior at [facility]  Prior Self Harm: denies Prior Violence: denies  Family Psych History: unknown Family Hx suicide: unknown  Social History:  Living Situation: lives in a group home Spiritual Hx: unknown Access to weapons/lethal means: reports he has access to firearms via his friend.   Substance History Alcohol: pt endorses use but he's unable to elaborate d/t mental decline  Illicit drugs: cocaine and THC  Prescription drug abuse: denies Rehab hx: unknown  Exam Findings  Physical Exam:  Vital Signs:  Temp:  [97.8 F (36.6 C)-98.6 F (37 C)] 98.6 F (37 C) (10/19 1546) Pulse Rate:  [60-67] 63 (10/19 1546) Resp:  [17-20] 18 (10/19 1546) BP: (145-168)/(97-110) 149/103 (10/19 1546) SpO2:  [100 %] 100 % (10/19 1546) Blood pressure (!) 149/103, pulse 63, temperature 98.6 F (37 C), resp. rate 18, SpO2 100%. There is no height or weight on file to calculate BMI.  Physical Exam Cardiovascular:     Rate and Rhythm: Normal rate.  Musculoskeletal:        General: Normal range of motion.     Cervical back: Normal range of motion.  Neurological:     Mental Status: He is alert. He is disoriented.  Psychiatric:        Mood and Affect: Mood is anxious and depressed. Affect is blunt.        Speech: Speech is delayed.        Behavior: Behavior is slowed.        Thought Content: Thought content includes suicidal ideation. Thought content includes suicidal plan.        Cognition and Memory: Cognition is impaired. He exhibits impaired recent memory.        Judgment: Judgment is impulsive and inappropriate.     Mental Status Exam: General  Appearance: Fairly Groomed  Orientation:  Other:  oriented to self only  Memory:  Immediate;   Poor Recent;   Poor Remote;   Poor  Concentration:  Concentration: Poor and Attention Span: Poor  Recall:  Poor  Attention  Fair  Eye Contact:  Fair  Speech:  Slow and Slurred  Language:  Fair  Volume:  Normal  Mood: Dysphoric, tired  Affect:  Blunt and Congruent  Thought Process:  Disorganized and Irrelevant  Thought Content:  Illogical  Suicidal Thoughts:  Yes.  with intent/plan  Homicidal Thoughts:  No  Judgement:  Poor  Insight:  Lacking  Psychomotor Activity:  Normal  Akathisia:  No  Fund of Knowledge:  Poor      Assets:  Desire for Improvement  Cognition:  Impaired,  Mild  ADL's:  Intact  AIMS (if indicated):        Other History   These have been pulled in through the EMR, reviewed, and updated if appropriate.  Family History:  The patient's family history is not on file.  Medical History: Past Medical History:  Diagnosis Date   Hypertension    Schizophrenia [facility])     Surgical History: History reviewed. No pertinent surgical history.   Medications:   Current Facility-Administered Medications:    haloperidol  lactate (HALDOL ) injection 5 mg, 5 mg, Intramuscular, BID PRN **AND** LORazepam  (ATIVAN ) injection 1 mg, 1 mg, Intramuscular, BID PRN, Sufyaan Palma E, NP   risperiDONE  (RISPERDAL ) tablet 1 mg, 1 mg, Oral, BID, Stark Aguinaga E, NP, 1 mg at 04/20/24 1451  Current Outpatient Medications:    risperiDONE  (RISPERDAL ) 1 MG tablet, Take 1 tablet (1 mg total) by mouth 2 (two) times daily., Disp: 7 tablet, Rfl: 0  Allergies: No Known Allergies  Aaron FORBES Barefoot, NP

## 2024-04-20 NOTE — Consult Note (Signed)
 Patient evaluated by psychiatry for suicidal ideations and plan involving a firearm.   Patient is a high utilizer of emergency services but given his presentation for decompensated schizophrenia, medication non-compliance and polysubstance abuse, he is not appropriate for discharge today.  Recommend restarting psychotropic medications and overnight observations and plan for AM discharge with safety plan.     Complete psychiatric consult note to follow.

## 2024-04-20 NOTE — ED Notes (Signed)
 One bag of belongings including pants, shirt, and shoes placed into small locker #2.

## 2024-04-20 NOTE — ED Notes (Signed)
Pt was given a sandwich and ginger ale  

## 2024-04-21 DIAGNOSIS — F1220 Cannabis dependence, uncomplicated: Secondary | ICD-10-CM

## 2024-04-21 DIAGNOSIS — F1420 Cocaine dependence, uncomplicated: Secondary | ICD-10-CM

## 2024-04-21 DIAGNOSIS — F259 Schizoaffective disorder, unspecified: Secondary | ICD-10-CM

## 2024-04-21 NOTE — ED Provider Notes (Signed)
 Emergency Medicine Observation Re-evaluation Note  Wilmon Laiken Nohr is a 43 y.o. male, seen on rounds today.  Pt initially presented to the ED for complaints of No chief complaint on file. Currently, the patient is not having any complaints.  Requesting food.  Physical Exam  BP (!) 153/102 (BP Location: Right Arm)   Pulse 73   Temp 97.6 F (36.4 C) (Oral)   Resp 18   SpO2 100%  Physical Exam General: Resting comfortably in stretcher, clinically sober Lungs: Normal work of breathing Psych: Calm  ED Course / MDM  EKG:   I have reviewed the labs performed to date as well as medications administered while in observation.  Recent changes in the last 24 hours include psychiatry recommended over night observation last night.  Peers improved this morning.  They have evaluated him and Moishe NP has cleared for discharge.  Plan  Current plan is for discharge with psychiatric resources.    Yolande Lamar BROCKS, MD 04/21/24 [phone]

## 2024-04-21 NOTE — Consult Note (Signed)
 [facility] Psychiatric Consult Follow-up  Patient Name: .Aaron Rubio  MRN: [id number]  DOB: 1981-06-30  Consult Order details:  Orders (From admission, onward)     Start     Ordered   04/20/24 0821  CONSULT TO CALL ACT TEAM       Ordering Provider: [name], PA-C  Provider:  (Not yet assigned)  Question:  Reason for Consult?  Answer:  Psych consult   04/20/24 0821             Mode of Visit: Tele-visit Virtual Statement:TELE PSYCHIATRY ATTESTATION & CONSENT As the provider for this telehealth consult, I attest that I verified the patient's identity using two separate identifiers, introduced myself to the patient, provided my credentials, disclosed my location, and performed this encounter via a HIPAA-compliant, real-time, face-to-face, two-way, interactive audio and video platform and with the full consent and agreement of the patient (or guardian as applicable.) Patient physical location: [facility] Emergency Department. Telehealth provider physical location: home office in state of [place].   Video start time: 0830 Video end time: 0856    Psychiatry Consult Evaluation  Service Date: April 21, 2024 LOS:  LOS: 0 days  Chief Complaint I know you offered me rehab but I can't do that right now. I have a court date coming up.  Primary Psychiatric Diagnoses  Schizoaffective Disorder 2.  Cocaine use disorder, severe dependence 3.  Cannabis use disorder, severe dependence  Assessment  Aaron Rubio is a 43 y.o. male admitted: Presented to the EDfor 04/19/2024  8:15 PM for  Per ED Provider Note dated 04/18/2024@2016 : Patient presents with: seeing things   Aaron Rubio is a 43 y.o. male patient with past medical history of cocaine use disorder with dependence, polysubstance abuse, homelessness, malingering, schizoaffective disorder presents to emergency room with complaint of hallucinations.  Patient reports that he always has hallucinations and he has not had  any acute changes.  He denies SI or HI. He does not want to speak to behavioral health about this and he reports he has been taking his medication.  He has no other complaints and he is requesting to be discharged without further workup after resting in the ER waiting room last night.   He carries the psychiatric diagnoses of cocaine use disorder, cannabis use disorder, schizoaffective disorder, malingering, MDD, homelessness and has a past medical history of HTN.   April 21, 2024: Since admission, he risperidone  was restarted, and he tolerated medication well.  He reports sleep and appetite are good.  His thoughts are now clear and coherent and he's able to engage in coherent discussion. He is alert and oriented, no longer endorses suicidal ideations and now denies access for firearms or other lethal means. He declines SA resources d/t pending court date on October 28th.   Please see plan below for detailed recommendations.   Diagnoses:  Active Hospital problems: Principal Problem:   Schizoaffective disorder (HCC) Active Problems:   Cocaine use disorder, severe, dependence (HCC)   Cannabis use disorder, severe, dependence (HCC)    Plan   ## Psychiatric Medication Recommendations:  Continue home medications -Risperidone  1mg  po BID   ## Medical Decision Making Capacity: Not specifically addressed in this encounter  ## Further Work-up:  -- Deferred  While pt on Qtc prolonging medications, please monitor & replete K+ to 4 and Mg2+ to 2 -- most recent EKG on 04/20/2024 had QtC of 416/416 -- Pertinent labwork reviewed earlier this admission includes: CMP, CBC, UDS   ##  Disposition:--   As per above, patient does not meet criteria for involuntary psychiatric admissions and states he is not interested in voluntary admissions.  He is psych cleared and recommended he follow up with outpatient resources [place]) for mental health and med management.     We reviewed the importance of  substance abuse abstinence; potential negative impact substance abuse can have on relationships and level of functioning.  Also reviewed the importance of medication compliance.   ## Behavioral / Environmental: - No specific recommendations at this time.     ## Safety and Observation Level:  - Based on my clinical evaluation, I estimate the patient to be at low risk of self harm in the current setting. - At this time, we recommend  routine. This decision is based on my review of the chart including patient's history and current presentation, interview of the patient, mental status examination, and consideration of suicide risk including evaluating suicidal ideation, plan, intent, suicidal or self-harm behaviors, risk factors, and protective factors. This judgment is based on our ability to directly address suicide risk, implement suicide prevention strategies, and develop a safety plan while the patient is in the clinical setting. Please contact our team if there is a concern that risk level has changed.  CSSR Risk Category:C-SSRS RISK CATEGORY: High Risk  Suicide Risk Assessment: Patient has following modifiable risk factors for suicide: recklessness, medication noncompliance, triggering events, and recent psychiatric hospitalization, which we are addressing by recommending overnight observation and restarting psychotropic medications. Patient has following non-modifiable or demographic risk factors for suicide: male gender and psychiatric hospitalization Patient has the following protective factors against suicide: Access to outpatient mental health care  Thank you for this consult request. Recommendations have been communicated to the primary team.  We will sign off  at this time.   Bernadette FORBES Barefoot, NP       History of Present Illness  Relevant Aspects of Hospital ED Course:  Admitted on 04/19/2024 for   Per RN Triage Note dated 04/19/2024@2022 : Pt here multiple times fro same , hearing  voices mostly at night he states , NO SI/HI , pt is homeless   Interval HPI 04/21/2024: Patient presents dangled at the bedside; he's alert and oriented and no longer appears mentally decompensated.  He reports tolerating risperidone  well, and denies side effects. He states he slept well, appetite is good.  He no longer endorses suicidal ideations, denies access to firearms; he denies HI.  His thoughts are coherent and devoid or psychosis, delusion or paranoia.  He denies concerns this Clinical research associate could address today.     Patient Report: 04/20/2024: Patient observed seated on bed, hob elevated.  He's fairly groomed and dressed in hospital scrubs; He appears tired, and withdrawn but is cooperative.  Patient's speech is non-sensible and at times slurred.  He is disoriented and has difficulty providing historical information.  He does appears preoccupied with religion, and his sexuality.  Patient does report being prescribed risperidone  and trazodone  but states he's homeless so cannot take it.  When asked about use of street drugs he is guarded but does nod his head yes when asked about marijuana and cocaine usage.  He also reports alcohol use but does not elaborate.  Patient is severely mentally decompensated, and has trouble participating in assessment. Considering this, additional questions deferred for now.   Psych ROS:  Depression: yes Anxiety:  yes Mania (lifetime and current): no Psychosis: (lifetime and current): past and present  Collateral information:  deferred  Review of Systems  Constitutional: Negative.   HENT: Negative.    Eyes: Negative.   Respiratory: Negative.    Cardiovascular: Negative.   Gastrointestinal: Negative.   Genitourinary: Negative.   Musculoskeletal: Negative.   Skin: Negative.   Neurological: Negative.   Endo/Heme/Allergies: Negative.   Psychiatric/Behavioral:  Positive for substance abuse. Negative for hallucinations and suicidal ideas. The patient is not  nervous/anxious and does not have insomnia.      Psychiatric and Social History  Psychiatric History:  Information collected from patient and chart review  Prev Dx/Sx: depression, polysubstance abuse Current Psych Provider: Merrily, had appt pending for 04/18/2024 but he did not attend. Home Meds (current): as listed above Previous Med Trials: unknown Therapy: no  Prior Psych Hospitalization: yes, most recent one month prior at [facility]  Prior Self Harm: denies Prior Violence: denies  Family Psych History: unknown Family Hx suicide: unknown  Social History:  Living Situation: lives in a group home Spiritual Hx: unknown Access to weapons/lethal means: denies  Substance History Alcohol: pt endorses use but he's unable to elaborate d/t mental decline  Illicit drugs: cocaine and THC  Prescription drug abuse: denies Rehab hx: unknown  Exam Findings  Physical Exam:  Vital Signs:  Temp:  [97.6 F (36.4 C)-98.6 F (37 C)] 97.6 F (36.4 C) (10/20 0937) Pulse Rate:  [63-73] 67 (10/20 0937) Resp:  [17-18] 17 (10/20 0937) BP: (134-153)/(94-103) 134/94 (10/20 0937) SpO2:  [100 %] 100 % (10/20 0937) Blood pressure (!) 134/94, pulse 67, temperature 97.6 F (36.4 C), temperature source Oral, resp. rate 17, SpO2 100%. There is no height or weight on file to calculate BMI.  Physical Exam Constitutional:      Appearance: Normal appearance.  Cardiovascular:     Rate and Rhythm: Normal rate.  Musculoskeletal:        General: Normal range of motion.     Cervical back: Normal range of motion.  Neurological:     Mental Status: He is alert and oriented to person, place, and time. Mental status is at baseline.  Psychiatric:        Attention and Perception: Attention and perception normal.        Mood and Affect: Mood normal.        Speech: Speech normal.        Behavior: Behavior is cooperative.        Thought Content: Thought content is not paranoid or delusional. Thought content does  not include homicidal or suicidal ideation. Thought content does not include homicidal or suicidal plan.        Cognition and Memory: Cognition and memory normal.        Judgment: Judgment normal.     Mental Status Exam: General Appearance: Fairly Groomed  Orientation:  Full (Time, Place, and Person)  Memory:  Immediate;   Good Recent;   Good Remote;   Good  Concentration:  Concentration: Good and Attention Span: Good  Recall:  Good  Attention  Good  Eye Contact:  Good  Speech:  Clear and Coherent  Language:  Good  Volume:  Normal  Mood: I'm good  Affect:  Appropriate and Congruent  Thought Process:  Coherent and Goal Directed  Thought Content:  Logical  Suicidal Thoughts:  No  Homicidal Thoughts:  No  Judgement:  Fair  Insight:  Lacking  Psychomotor Activity:  Normal  Akathisia:  No  Fund of Knowledge:  Fair      Assets:  Manufacturing systems engineer  Cognition:  WNL  ADL's:  Intact  AIMS (if indicated):   n/a     Other History   These have been pulled in through the EMR, reviewed, and updated if appropriate.  Family History:  The patient's family history is not on file.  Medical History: Past Medical History:  Diagnosis Date   Hypertension    Schizophrenia [facility])     Surgical History: History reviewed. No pertinent surgical history.   Medications:  No current facility-administered medications for this encounter.  Current Outpatient Medications:    risperiDONE  (RISPERDAL ) 1 MG tablet, Take 1 tablet (1 mg total) by mouth 2 (two) times daily., Disp: 7 tablet, Rfl: 0  Allergies: No Known Allergies  Bernadette FORBES Barefoot, NP

## 2024-04-22 ENCOUNTER — Other Ambulatory Visit: Payer: Self-pay

## 2024-04-22 ENCOUNTER — Emergency Department (HOSPITAL_COMMUNITY)
Admission: EM | Admit: 2024-04-22 | Discharge: 2024-04-24 | Disposition: A | Discharge status: Other Acute Inpatient Hospital

## 2024-04-22 DIAGNOSIS — F259 Schizoaffective disorder, unspecified: Secondary | ICD-10-CM

## 2024-04-22 DIAGNOSIS — R45850 Homicidal ideations: Secondary | ICD-10-CM | POA: Insufficient documentation

## 2024-04-22 DIAGNOSIS — F329 Major depressive disorder, single episode, unspecified: Secondary | ICD-10-CM | POA: Insufficient documentation

## 2024-04-22 DIAGNOSIS — F1910 Other psychoactive substance abuse, uncomplicated: Secondary | ICD-10-CM | POA: Diagnosis not present

## 2024-04-22 DIAGNOSIS — F250 Schizoaffective disorder, bipolar type: Secondary | ICD-10-CM | POA: Diagnosis present

## 2024-04-22 DIAGNOSIS — I10 Essential (primary) hypertension: Secondary | ICD-10-CM | POA: Insufficient documentation

## 2024-04-22 DIAGNOSIS — R45851 Suicidal ideations: Secondary | ICD-10-CM | POA: Diagnosis not present

## 2024-04-22 LAB — CBC WITH DIFFERENTIAL/PLATELET
Abs Immature Granulocytes: 0.05 K/uL (ref 0.00–0.07)
Basophils Absolute: 0.1 K/uL (ref 0.0–0.1)
Basophils Relative: 1 %
Eosinophils Absolute: 0 K/uL (ref 0.0–0.5)
Eosinophils Relative: 0 %
HCT: 44.8 % (ref 39.0–52.0)
Hemoglobin: 14.2 g/dL (ref 13.0–17.0)
Immature Granulocytes: 0 %
Lymphocytes Relative: 18 %
Lymphs Abs: 2.3 K/uL (ref 0.7–4.0)
MCH: 25.8 pg — ABNORMAL LOW (ref 26.0–34.0)
MCHC: 31.7 g/dL (ref 30.0–36.0)
MCV: 81.3 fL (ref 80.0–100.0)
Monocytes Absolute: 1.3 K/uL — ABNORMAL HIGH (ref 0.1–1.0)
Monocytes Relative: 10 %
Neutro Abs: 9.3 K/uL — ABNORMAL HIGH (ref 1.7–7.7)
Neutrophils Relative %: 71 %
Platelets: 284 K/uL (ref 150–400)
RBC: 5.51 MIL/uL (ref 4.22–5.81)
RDW: 13.6 % (ref 11.5–15.5)
WBC: 13.1 K/uL — ABNORMAL HIGH (ref 4.0–10.5)
nRBC: 0 % (ref 0.0–0.2)

## 2024-04-22 LAB — BASIC METABOLIC PANEL WITH GFR
Anion gap: 15 (ref 5–15)
BUN: 20 mg/dL (ref 6–20)
CO2: 23 mmol/L (ref 22–32)
Calcium: 10.2 mg/dL (ref 8.9–10.3)
Chloride: 99 mmol/L (ref 98–111)
Creatinine, Ser: 1.46 mg/dL — ABNORMAL HIGH (ref 0.61–1.24)
GFR, Estimated: >60 mL/min (ref >60)
Glucose, Bld: 73 mg/dL (ref 70–99)
Potassium: 4.4 mmol/L (ref 3.5–5.1)
Sodium: 138 mmol/L (ref 135–145)

## 2024-04-22 LAB — ETHANOL: Alcohol, Ethyl (B): <15 mg/dL (ref <15)

## 2024-04-22 NOTE — ED Triage Notes (Addendum)
 Pt reports concern for bilateral feet pain and wants help with his mental health issues. Pt rambling in triage. Ambulatory on arrival.

## 2024-04-22 NOTE — ED Provider Notes (Addendum)
 [place] EMERGENCY DEPARTMENT AT [facility] Provider Note   CSN: [id number] Arrival date & time: 04/22/24  2151     Patient presents with: Foot Pain (BIlateral) and Mental Health Problem   Aaron Rubio is a 43 y.o. male past medical history significant for polysubstance abuse, homelessness, malingering, schizophrenia, and hypertension presents today for suicidal and homicidal ideations.  Patient also reporting hallucinations and speaking in word salad with nonsensical speech.  Patient does report his right great toe hurts after it fell off and grew back.  Patient also reporting that he has not slept in a unreasonable amount of time.    Foot Pain  Mental Health Problem Presenting symptoms: hallucinations and suicidal thoughts        Prior to Admission medications   Medication Sig Start Date End Date Taking? Authorizing Provider  risperiDONE  (RISPERDAL ) 1 MG tablet Take 1 tablet (1 mg total) by mouth 2 (two) times daily. 04/18/24   Lenard Calin, MD    Allergies: Patient has no known allergies.    Review of Systems  Psychiatric/Behavioral:  Positive for behavioral problems, hallucinations, sleep disturbance and suicidal ideas. The patient is hyperactive.     Updated Vital Signs BP (!) 143/112 (BP Location: Left Arm)   Pulse 93   Temp 98.3 F (36.8 C)   Resp 20   SpO2 100%   Physical Exam Vitals and nursing note reviewed.  Constitutional:      General: He is not in acute distress.    Appearance: He is well-developed. He is not toxic-appearing.  HENT:     Head: Normocephalic and atraumatic.     Right Ear: External ear normal.     Left Ear: External ear normal.  Eyes:     Conjunctiva/sclera: Conjunctivae normal.  Cardiovascular:     Rate and Rhythm: Normal rate and regular rhythm.     Heart sounds: No murmur heard. Pulmonary:     Effort: Pulmonary effort is normal. No respiratory distress.     Breath sounds: Normal breath sounds.   Abdominal:     Palpations: Abdomen is soft.     Tenderness: There is no abdominal tenderness.  Musculoskeletal:        General: No swelling.     Cervical back: Neck supple.     Comments: Patient with unkempt toenails, no swelling, deformity, or wound present on exam.  Patient neurovascularly intact.  Skin:    General: Skin is warm and dry.     Capillary Refill: Capillary refill takes less than 2 seconds.  Neurological:     Mental Status: He is alert.  Psychiatric:        Attention and Perception: He is inattentive.        Mood and Affect: Mood is elated. Affect is inappropriate.        Speech: Speech is rapid and pressured and tangential.        Behavior: Behavior is hyperactive.        Thought Content: Thought content is delusional. Thought content includes homicidal and suicidal ideation. Thought content includes homicidal and suicidal plan.     (all labs ordered are listed, but only abnormal results are displayed) Labs Reviewed  CBC WITH DIFFERENTIAL/PLATELET - Abnormal; Notable for the following components:      Result Value   WBC 13.1 (*)    MCH 25.8 (*)    Neutro Abs 9.3 (*)    Monocytes Absolute 1.3 (*)    All other components within normal limits  BASIC METABOLIC PANEL WITH GFR - Abnormal; Notable for the following components:   Creatinine, Ser 1.46 (*)    All other components within normal limits  ETHANOL  URINE DRUG SCREEN    EKG: None  Radiology: No results found.   Procedures   Medications Ordered in the ED - No data to display                                  Medical Decision Making Amount and/or Complexity of Data Reviewed Labs: ordered.   This patient presents to the ED for concern of homicidal ideation, suicidal ideation, hallucinations differential diagnosis includes malingering, intoxication, homicidal ideation, suicidal ideation, schizophrenia    Additional history obtained Additional history obtained from Electronic Medical  Record External records from outside source obtained and reviewed including behavioral health consultations   Lab Tests:  I Ordered, and personally interpreted labs.  The pertinent results include: Leukocytosis at 13.1, ethanol <15, elevated creatinine 1.46 which is not uncommon per historical values EKG which showed normal sinus rhythm, nonspecific T wave abnormality CT   Problem List / ED Course:  Consulted TTS who will determine patient disposition.        Final diagnoses:  None    ED Discharge Orders     None          Francis Ileana SAILOR, PA-C 04/22/24 2319    Francis Ileana SAILOR, PA-C 04/22/24 EMMY Land, April, MD 04/22/24 2329    Francis Ileana SAILOR, PA-C 04/23/24 9688    Land, April, MD 04/23/24 [phone]

## 2024-04-23 LAB — URINE DRUG SCREEN
Amphetamines: NEGATIVE
Barbiturates: NEGATIVE
Benzodiazepines: NEGATIVE
Cocaine: POSITIVE — AB
Fentanyl: NEGATIVE
Methadone Scn, Ur: NEGATIVE
Opiates: NEGATIVE
Tetrahydrocannabinol: NEGATIVE

## 2024-04-23 MED ORDER — LORAZEPAM 1 MG PO TABS: 1.0000 mg | ORAL_TABLET | ORAL | Status: DC | PRN

## 2024-04-23 MED ORDER — RISPERIDONE 0.5 MG PO TBDP
2.0000 mg | ORAL_TABLET | Freq: Three times a day (TID) | ORAL | Status: DC | PRN
  Administered 2024-04-23: 2 mg via ORAL
  Filled 2024-04-23: qty 4

## 2024-04-23 MED ORDER — QUETIAPINE FUMARATE 50 MG PO TABS
50.0000 mg | ORAL_TABLET | Freq: Every day | ORAL | Status: DC
  Administered 2024-04-23: 50 mg via ORAL
  Filled 2024-04-23: qty 1

## 2024-04-23 MED ORDER — ZIPRASIDONE MESYLATE 20 MG IM SOLR: 20.0000 mg | INTRAMUSCULAR | Status: DC | PRN

## 2024-04-23 MED ORDER — ONDANSETRON 4 MG PO TBDP: 4.0000 mg | ORAL_TABLET | ORAL | Status: DC | PRN

## 2024-04-23 MED ORDER — SERTRALINE HCL 50 MG PO TABS
50.0000 mg | ORAL_TABLET | Freq: Every day | ORAL | Status: DC
  Administered 2024-04-23 – 2024-04-24 (×2): 50 mg via ORAL
  Filled 2024-04-23 (×2): qty 1

## 2024-04-23 NOTE — ED Notes (Addendum)
 Pt has been accepted to [facility] on 04/23/2024 . Unit assignment: [address]   Pt meets inpatient criteria per Karna Pizza, NP   Attending Physician will be Dr. Oliva Charleston  Report can be called to: - [phone]  Pt can arrive tomorrow   Care Team Notified: Regino Browner, RN, Karna Pizza, NP, Chesley Holt

## 2024-04-23 NOTE — ED Notes (Signed)
PT given snack

## 2024-04-23 NOTE — BH Assessment (Signed)
 Clinician spoke to Aaron Rubio with [facility] to complete pt's TTS assessment. Clinician provided pt's name, MRN, location. Secure message completed.    [facility] coordinator to update secure chat when assessment time and provider are assigned.   Jackson JONETTA Broach, MS, [facility], [facility] Triage Specialist [phone]
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
## 2024-04-23 NOTE — ED Notes (Signed)
 This tech woke the patient up and asked him to dress out into his purple scrubs.Patient belongings have been p[laced in cabinets 19[address]22 ( hall D ) with 3 belonging bags

## 2024-04-23 NOTE — ED Notes (Signed)
 Pt belongings put in locker 31
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
## 2024-04-23 NOTE — ED Notes (Signed)
Patient is resting at the moment

## 2024-04-23 NOTE — ED Notes (Signed)
 Pt was given their dinner tray.

## 2024-04-23 NOTE — Progress Notes (Signed)
 Inpatient Psychiatric Referral  Patient was recommended inpatient per Aaron Pizza, NP . There are no available beds at [facility], per [facility] . Patient was referred to the following out of network facilities:  Destination  Service Provider Address Phone Fax  [facility]  [address] [place] [address] [phone]  [facility]  [address] [place] [address] [phone]  [facility]  [address], [place] [address] [phone]  [facility]  [address] [place] [address] [phone]  [facility] EFAX  [address] [place], [place] [phone]  [facility]  [address] [place] [address] [phone]  [facility]  [address] [place] [address] [phone]    Situation ongoing, CSW to continue following and update chart as more information becomes available.   Aaron Rubio MSW, LCSWA 04/23/2024  2:55PM

## 2024-04-23 NOTE — ED Notes (Signed)
 Report given to Ranger, Charity fundraiser.

## 2024-04-23 NOTE — ED Notes (Signed)
 Techs asked this patient to dress out into his purple scrubs.

## 2024-04-23 NOTE — ED Notes (Addendum)
 Pt had been recommended for overnight observation and reassessment this morning by provider. Pt has been referred to [facility] for disposition. [facility] consult scheduled for 1:00 pm.  Chesley Holt, [facility]  04/23/24

## 2024-04-23 NOTE — Progress Notes (Signed)
 Pt has been accepted to [facility] on 04/23/2024 . Unit assignment: [address]   Pt meets inpatient criteria per Karna Pizza, NP   Attending Physician will be Dr. Oliva Charleston  Report can be called to: - [phone]  Pt can arrive tomorrow   Care Team Notified: Regino Browner, RN, Karna Pizza, NP, Chesley Holt

## 2024-04-23 NOTE — ED Notes (Signed)
 Tried to clean pt's over bed table in hall D.   Pt wanted his trash.

## 2024-04-24 NOTE — ED Provider Notes (Signed)
 Emergency Medicine Observation Re-evaluation Note  Aaron Rubio is a 43 y.o. male, seen on rounds today.  Pt initially presented to the ED for complaints of Foot Pain (BIlateral), Mental Health Problem, and Suicidal Currently, the patient is resting.  Physical Exam  BP 127/82   Pulse 67   Temp 97.8 F (36.6 C) (Oral)   Resp 18   SpO2 100%  Physical Exam General: nad   ED Course / MDM  EKG:EKG Interpretation Date/Time:  Tuesday April 22 2024 23:05:33 EDT Ventricular Rate:  92 PR Interval:  154 QRS Duration:  76 QT Interval:  374 QTC Calculation: 462 R Axis:   -24  Text Interpretation: Normal sinus rhythm Nonspecific T wave abnormality Abnormal ECG When compared with ECG of 20-Apr-2024 08:29, PREVIOUS ECG IS PRESENT Confirmed by Theadore Sharper [id number]) on 04/23/2024 9:15:02 PM  I have reviewed the labs performed to date as well as medications administered while in observation.  Recent changes in the last 24 hours include accepted at [facility] .  Plan  Current plan [facility] for [facility] to [facility].    Elnor Savant A, DO 04/24/24 0800

## 2024-04-24 NOTE — Discharge Instructions (Addendum)
 RESOURCE GUIDE  Chronic Pain Problems: Contact [facility]  [phone] Patients need to be referred by their primary care doctor.  Insufficient Money for Medicine: Contact [employer]:  call [phone]  No Primary Care Doctor: Call Health Connect  [phone] - can help you locate a primary care doctor that  accepts your insurance, provides certain services, etc. Physician Referral Service- [phone]  Agencies that provide inexpensive medical care: [facility]  [phone] [facility]  [phone] [facility]  [phone] [facility]  [phone] [facility]  [phone] [facility]  [phone]  Medicaid-accepting [facility] Providers: [facility]- [address]  [phone], Mon-Fri 9am-7pm, Sat 9am-1pm [facility]- [address] [place], Suite [place]  [phone] [facility]- [address], [address] [place]  [phone] [facility]- [address]  [phone] Renaye Rakers- [address] [place], [address], [phone]  Only accepts [place] patients after they have their name  applied to their card  Self Pay (no insurance) in [facility]: Sickle Cell Patients - [facility]  [address] [place], [phone] [facility] Urgent Care- [address] [place]  [phone]       [facility] [place]- [address]       -     [facility]- see information above (Speak to [employer] if you do not have insurance)       -  [facility]- [address],  [phone]       -  [facility]- [address] [phone]       -  Dr Julio Sicks-  [address], [place], [phone]       -  [facility] - [address] [phone]       -  [facility]- [address] [phone], also [address],  [phone]       -     [facility]- [address] [place], [phone], 1st & 3rd Saturday         every month, 10am-1pm  -     [facility]   [address] [place], [place].   Phone:  [phone], Fax:  [phone]. Hours of Operation:  9 am - 6 pm, M-F.  -     [facility]   [address], [place]   Phone: [phone], Fax: [phone]. Hours of Operation:  8:30 am - 5:30 pm, M-F.    Dental Assistance If unable to pay or uninsured, contact:  [facility]. to become qualified for the adult dental clinic.  Patients with Medicaid: [facility] [phone] [address], [phone] [address], [phone]  If unable to pay, or uninsured, contact [facility] [phone] in [place], [phone] in [facility]) to become qualified for the adult dental clinic  [facility] [address] [place], [place] [address] [phone] [email or web address]  Other Proofreader Services: [facility]- [address] [place], [place], [place], [address], [phone], Ext. [phone], 2nd and 4th Thursday of the month at 6:30am.  10 clients each day by appointment, can sometimes see walk-in patients if someone does not show for an appointment.  [facility]- [address] [place], [place], [address], [phone] [facility] [address] [place], [place], [address], [phone] [facility] Health Department- [phone] [facility] Health Department- [phone] [facility] [phone]

## 2024-04-29 ENCOUNTER — Emergency Department (HOSPITAL_COMMUNITY)
Admission: EM | Admit: 2024-04-29 | Discharge: 2024-04-30 | Disposition: A | Discharge status: Home / Self Care | Source: Home / Self Care | Attending: Emergency Medicine | Admitting: Emergency Medicine

## 2024-04-29 ENCOUNTER — Emergency Department (HOSPITAL_COMMUNITY)
Admission: EM | Admit: 2024-04-29 | Discharge: 2024-04-29 | Disposition: A | Discharge status: Home / Self Care | Attending: Emergency Medicine | Admitting: Emergency Medicine

## 2024-04-29 ENCOUNTER — Other Ambulatory Visit: Payer: Self-pay

## 2024-04-29 ENCOUNTER — Ambulatory Visit (HOSPITAL_COMMUNITY): Admission: EM | Admit: 2024-04-29 | Discharge: 2024-04-29 | Disposition: A | Discharge status: Home / Self Care

## 2024-04-29 ENCOUNTER — Encounter (HOSPITAL_COMMUNITY): Payer: Self-pay | Admitting: Emergency Medicine

## 2024-04-29 DIAGNOSIS — Z5900 Homelessness unspecified: Secondary | ICD-10-CM | POA: Insufficient documentation

## 2024-04-29 DIAGNOSIS — F1420 Cocaine dependence, uncomplicated: Secondary | ICD-10-CM

## 2024-04-29 DIAGNOSIS — M79674 Pain in right toe(s): Secondary | ICD-10-CM | POA: Diagnosis present

## 2024-04-29 DIAGNOSIS — B351 Tinea unguium: Secondary | ICD-10-CM | POA: Insufficient documentation

## 2024-04-29 DIAGNOSIS — F259 Schizoaffective disorder, unspecified: Secondary | ICD-10-CM

## 2024-04-29 MED ORDER — ACETAMINOPHEN 325 MG PO TABS
650.0000 mg | ORAL_TABLET | Freq: Four times a day (QID) | ORAL | Status: AC | PRN
  Administered 2024-04-29: 650 mg via ORAL
  Filled 2024-04-29: qty 2

## 2024-04-29 NOTE — ED Triage Notes (Signed)
 Pt states he has been walking in the cold and his feet are painful and swollen; also c/o rhinorrhea

## 2024-04-29 NOTE — ED Provider Notes (Signed)
 [facility]-EMERGENCY DEPT [facility] Emergency Department Provider Note MRN:  [id number]  Arrival date & time: 04/30/24     Chief Complaint   Toenail problem  History of Present Illness   Aaron Rubio is a 43 y.o. year-old male presents to the ED with chief complaint of toe pain.  He states that his right great toenail is lifting away and about to fall off.  He also states that he is cold and needs somewhere to warm up.  He denies any other new complaints tonight.  History provided by patient.   Review of Systems  Pertinent positive and negative review of systems noted in HPI.    Physical Exam   Vitals:   04/29/24 1823  BP: (!) 149/91  Pulse: 92  Resp: 18  Temp: 97.8 F (36.6 C)    CONSTITUTIONAL:  non toxic-appearing, NAD NEURO:  Alert and oriented x 3, CN 3-12 grossly intact EYES:  eyes equal and reactive ENT/NECK:  Supple, no stridor  CARDIO:  normal rate, appears well-perfused  PULM:  No respiratory distress,  GI/GU:  non-distended,  MSK/SPINE:  No gross deformities, no edema, moves all extremities  SKIN:  no rash, onychomycosis of bilateral lower extremities, right great toenail has split and is partially avulsed   *Additional and/or pertinent findings included in MDM below  Diagnostic and Interventional Summary    EKG Interpretation Date/Time:    Ventricular Rate:    PR Interval:    QRS Duration:    QT Interval:    QTC Calculation:   R Axis:      Text Interpretation:         Labs Reviewed - No data to display  No orders to display    Medications  acetaminophen  (TYLENOL ) tablet 650 mg (650 mg Oral Given 04/29/24 1845)     Procedures  /  Critical Care Nail Removal  Date/Time: 04/29/2024 10:39 PM  Performed by: Vicky Charleston, PA-C Authorized by: Vicky Charleston, PA-C   Consent:    Consent obtained:  Verbal   Consent given by:  Patient   Risks, benefits, and alternatives were discussed: yes     Risks discussed:  Incomplete  removal   Alternatives discussed:  No treatment Universal protocol:    Procedure explained and questions answered to patient or proxy's satisfaction: yes     Relevant documents present and verified: yes     Test results available: yes     Imaging studies available: yes     Required blood products, implants, devices, and special equipment available: yes     Site/side marked: yes     Immediately prior to procedure, a time out was called: yes     Patient identity confirmed:  Verbally with patient Procedure details:    Location:  Foot   Foot location:  R big toe Anesthesia:    Anesthesia method:  None Nail Removal:    Nail removed:  Partial   Nail removed location: superior portion. Post-procedure details:    Procedure completion:  Tolerated well, no immediate complications Comments:     The superficial portion of the right toenail had avulsed from the rest of the nail and was only hanging on by a small piece of nail.  I trimmed this back and removed the superficial portion.  No incisions, or penetration of the soft tissues was needed.     ED Course and Medical Decision Making  I have reviewed the triage vital signs, the nursing notes, and pertinent available records from  the EMR.  Social Determinants Affecting Complexity of Care: Patient has no clinically significant social determinants affecting this chief complaint..   ED Course:    Medical Decision Making Patient here with onychomycosis of bilateral toenails.  He also states that he is cold.  It has been cold and rainy today.  Patient is homeless.  He is given a lunch bag and soda.  Let him warm up.  I advised PCP followup for treatment of his onychomycosis.   Risk OTC drugs.         Consultants: No consultations were needed in caring for this patient.   Treatment and Plan: Emergency department workup does not suggest an emergent condition requiring admission or immediate intervention beyond  what has been  performed at this time. The patient is safe for discharge and has  been instructed to return immediately for worsening symptoms, change in  symptoms or any other concerns    Final Clinical Impressions(s) / ED Diagnoses     ICD-10-CM   1. Homeless  Z59.00     2. Onychomycosis  B35.1       ED Discharge Orders     None         Discharge Instructions Discussed with and Provided to Patient:   Discharge Instructions   None      Vicky Charleston, PA-C 04/30/24 0058    Yolande Charleston BROCKS, MD 05/02/24 1901

## 2024-04-29 NOTE — ED Triage Notes (Signed)
 Patient here tonight because he's cold and needs somewhere warm to stay tonight. It is raining and cold outside.

## 2024-04-29 NOTE — ED Triage Notes (Signed)
 I visualized feet. There is no obvious infections but they do appear to have been wet for a bit and his left big toe nail appears to have a significant crack toward the base. Appears he may loose it.

## 2024-04-29 NOTE — ED Notes (Signed)
 Pt discharged in no acute distress. Received AVS and verbalized understanding of recommendations reviewed with pt. Denies SI/HI/AVH. Safety maintained.

## 2024-04-29 NOTE — ED Provider Notes (Signed)
  [facility]-EMERGENCY DEPT [facility] Emergency Department Provider Note MRN:  [id number]  Arrival date & time: 04/29/24     Chief Complaint   Homeless   History of Present Illness   Aaron Rubio is a 43 y.o. year-old male presents to the ED with chief complaint of homelessness and states that he was cold and shivering from being out in the rain.  He states that he came and checked in to get warm.  He denies any other complaints.  History provided by patient.   Review of Systems  Pertinent positive and negative review of systems noted in HPI.    Physical Exam   Vitals:   04/29/24 0038  BP: (!) 167/118  Pulse: 75  Resp: 18  SpO2: 100%    CONSTITUTIONAL:  non toxic-appearing, NAD NEURO:  Alert and oriented x 3, CN 3-12 grossly intact EYES:  eyes equal and reactive ENT/NECK:  Supple, no stridor  CARDIO:  normal rate, appears well-perfused  PULM:  No respiratory distress,  GI/GU:  non-distended,  MSK/SPINE:  No gross deformities, no edema, moves all extremities  SKIN:  no rash, atraumatic   *Additional and/or pertinent findings included in MDM below  Diagnostic and Interventional Summary    EKG Interpretation Date/Time:    Ventricular Rate:    PR Interval:    QRS Duration:    QT Interval:    QTC Calculation:   R Axis:      Text Interpretation:         Labs Reviewed - No data to display  No orders to display    Medications - No data to display   Procedures  /  Critical Care Procedures  ED Course and Medical Decision Making  I have reviewed the triage vital signs, the nursing notes, and pertinent available records from the EMR.  Social Determinants Affecting Complexity of Care: Patient is homelessness.   ED Course:    Medical Decision Making Patient here requesting to warm up from being out in the rain and cold.  He looks well.  Will let him warm up and dry off.  Will discharge.         Consultants: No consultations were needed in  caring for this patient.   Treatment and Plan: Emergency department workup does not suggest an emergent condition requiring admission or immediate intervention beyond  what has been performed at this time. The patient is safe for discharge and has  been instructed to return immediately for worsening symptoms, change in  symptoms or any other concerns    Final Clinical Impressions(s) / ED Diagnoses     ICD-10-CM   1. Homeless  Z59.00       ED Discharge Orders     None         Discharge Instructions Discussed with and Provided to Patient:   Discharge Instructions   None      Vicky Charleston, PA-C 04/29/24 9688    Carita Senior, MD 04/29/24 0500

## 2024-04-29 NOTE — Progress Notes (Signed)
   04/29/24 1418  [facility] Triage Screening (Walk-ins at [facility] only)  How Did You Hear About Us ? Self  What Is the Reason for Your Visit/Call Today? Patient is a 43 y.o. male who presents today after being discharged [facility].  He states he had to leave yesterday to make a court date today.  He states he made it to court for his misdemeanor larceny charge and has a continuance.  Patient mentions feeling a predator is coming after me, like a spirit.  He states he received discharge paperwork from [facility], however states, It must have gotten messed up in the rain.  He cannot say what pharmacy his medications were called in to.  He does recall he is Rx Zyprexa  and Trazadone.  Patient denies SI, however then states he sometimes wonders if that is the only way out.  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  He reprots he last used yesterday around 4:30pm. Patient is well known to providers due to frequent ED visits for seeking shelter.   Per EDP note from [facility] this morning:43 y.o. year-old male presents to the ED with chief complaint of homelessness and states that he was cold and shivering from being out in the rain.  He states that he came and checked in to get warm.  He denies any other complaints.  How Long Has This Been Causing You Problems? > than 6 months  Have You Recently Had Any Thoughts About Hurting Yourself? No  How long ago did you have thoughts about hurting yourself? passive SI, references only way out no plan or reported intent  Are You Planning to Commit Suicide/Harm Yourself At This time? No  Have you Recently Had Thoughts About Hurting Someone [name]? No  How long ago did you have thoughts of harming others? N/A  Are You Planning To Harm Someone At This Time? No  Explanation: N/A  Physical Abuse Denies  Verbal Abuse Denies   Sexual Abuse Denies  Exploitation of patient/patient's resources Denies  Self-Neglect Denies  Possible abuse reported to:  (N/A)  Are you currently experiencing any auditory, visual or other hallucinations? No  Please explain the hallucinations you are currently experiencing: Chronic AH per chart review - denies current, likely due to being stabilized at [facility], d/c yest.  Have You Used Any Alcohol or Drugs in the Past 24 Hours? Yes  What Did You Use and How Much? Cocaine  - amt unknown  Do you have any current medical co-morbidities that require immediate attention? No  Clinician description of patient physical appearance/behavior: Patient is calm, cooperative, AAOx4  What Do You Feel Would Help You the Most Today? Treatment for Depression or other mood problem;Medication(s)  If access to [facility] was not available, would you have sought care in the Emergency Department? No  Determination of Need Routine (7 days)  Options For Referral Medication Management;Outpatient Therapy

## 2024-04-29 NOTE — Discharge Instructions (Addendum)
 Get help right away if: You have thoughts about hurting yourself or others. Get help right away if you feel like you may hurt yourself or others, or have thoughts about taking your own life. Go to your nearest emergency room or: Call 911. Call the National Suicide Prevention Lifeline at (440) 727-2827 or 988 in the [place] This is open 24 hours a day. If you're a Veteran: Call 988 and press 1. This is open 24 hours a day. Text the [employer] at 312 111 7564. Summary Mental health is not just the absence of mental illness. It involves understanding your emotions and behaviors, and taking steps to manage them in a healthy way. If you have symptoms of mental or emotional distress, get help from family, friends, a health care provider, or a mental health professional. Practice good mental health behaviors such as stress management skills, self-calming skills, exercise, healthy sleeping and eating, and supportive relationships. This information is not intended to replace advice given to you by your health care provider. Make sure you discuss any questions you have with your health care provider.  Education provided on the fact that if experiencing worsening of psychiatry symptoms including suicidal ideations, homicidal ideations, or having auditory/visual hallucinations, etc, to call 911, 988, come back to this location, or go to the nearest ER. Pt verbalized understanding.  [facility]  [employer] [address] [place], [place], [address] [phone] phone  Offers food and emergency or transitional housing to men, women, or families in need. Clients participate in programs and workshops developed to promote self-sufficiency and personal development.Call or walk in. Applications are accepted Monday, Wednesday, and Friday by appointment only. Need photo ID and proof of income.  [facility] [address] [place], [place] [address] [phone] Population  served: Adult men & women (53 years old and older, able to perform activities for daily living) Documents required: Valid ID & Social Security Card  [facility] [address] [place], [place]  [address] [phone] Population served: Families with children  [facility] [address] [place], [place]  [address] [phone] Population served: Single women 18+ without dependents Documents required:  Valid ID & Social Security Card  [facility] [address], [place], [place]  [address] [phone] Population served: Male veterans 18+ with substance abuse/mental health issues Eligibility: By referral only  [facility] [address], [place], [place] [address] [phone] Population served: Males 18+ Documents required: Valid ID & Social Security Card  [facility] at [employer] [facility] [employer]. [address] [place] [address] [phone] or [phone] Population served: Pregnant women with or without children  Documents required: Valid ID & Social Security [employer] [address] [place], [place] [address] [phone] Population Served: Families with children  [facility] [address] [place], [place] [address] [phone] Population served: Men 18+, preference for disabled and/or veterans Eligibility: By referral only

## 2024-04-29 NOTE — ED Provider Notes (Signed)
 [facility] Medical Screening Exam  Patient Name: Aaron Rubio MRN: [id number] Date of Evaluation: 04/29/24 Chief Complaint:  My nerves are bad, hearing voices Diagnosis:  Final diagnoses:  Homelessness  Schizoaffective disorder, unspecified type (HCC)  Cocaine use disorder, severe, dependence (HCC)    History of Present illness: Aaron Rubio is a 43 y.o. male. Patient reports that his nerves are bad and that he is hearing voices. I'm waiting on things to happen. In an incoherent manner he says that things were not going well at [facility] and he needed to go to court today but it was continued. The nurses were having the same clothes and racial issues. I'm endangering other emotions and it is like a ditch. Younger age keep attacking me. I don't know what is going on. Pt went to the ER today for shelter and to warm up. He is not willing to take medication. I'm out in the cold. I am a walking zombie.  I am waiting on things to happen. I want a better day.  Pt does not believe that medication helps him that the medication might be poisoning him. He does not take the medication. He will not agree to [facility] as he says he has something against injectables. Pt has been admitted multiple times without a noticeable benefit. He does not believe that he has a mental illness.  I don't think it was real. People gotta go home at the end of the day.   Review of the chart reveals multiple ED visits and multiple observation stays and psychiatric hospitalizations. Pt has been to the ED today and now presents at [facility]. Pt is at a chronic risk of violent and self destructive behavior and is vulnerable to abuse in the community. Another hospitalization today would not change his chronic risk and there is not evidence of him being an imminent risk of harm to himself or others.   Flowsheet Row ED from 04/29/2024 in [facility] Most recent  reading at 04/29/2024  2:28 PM ED from 04/29/2024 in [facility] Emergency Department at [facility] Most recent reading at 04/29/2024 12:38 AM ED from 04/19/2024 in [facility] Emergency Department at [facility] Most recent reading at 04/20/2024  5:20 PM  C-SSRS RISK CATEGORY High Risk No Risk High Risk    Psychiatric Specialty Exam  Presentation  General Appearance:Appropriate for Environment  Eye Contact:Good  Speech:Normal Rate  Speech Volume:Normal  Handedness:Right   Mood and Affect  Mood: Dysphoric  Affect: Constricted   Thought Process  Thought Processes: Irrevelant  Descriptions of Associations:Loose  Orientation:Full (Time, Place and Person)  Thought Content:Illogical; Scattered  Diagnosis of Schizophrenia or Schizoaffective disorder in past: No  Duration of Psychotic Symptoms: Greater than six months  Hallucinations:Visual; Auditory He seems to have been talking to himself at some point during the interviw. glare coming off of things  Ideas of Reference:Percusatory  Suicidal Thoughts:Yes, Passive Without Intent  Homicidal Thoughts:No   Sensorium  Memory: Immediate Fair; Recent Fair; Remote Fair  Judgment: Fair  Insight: Lacking   Executive Functions  Concentration: Fair  Attention Span: Fair  Recall: [employer] of Knowledge: Fair  Language: Fair   Psychomotor Activity  Psychomotor Activity: Normal   Assets  Assets: Physical Health   Sleep  Sleep: Fair  Number of hours:  5   Physical Exam: Physical Exam Constitutional:      Appearance: He is not ill-appearing.  HENT:  Head: Normocephalic and atraumatic.     Nose: Nose normal.  Eyes:     Conjunctiva/sclera: Conjunctivae normal.  Pulmonary:     Effort: Pulmonary effort is normal.  Musculoskeletal:        General: Normal range of motion.  Skin:    General: Skin is warm.  Neurological:     Mental Status: He is alert and oriented  to person, place, and time.    Review of Systems  Constitutional:  Negative for chills and fever.  Eyes:  Negative for blurred vision.  Respiratory:  Negative for cough.   Cardiovascular:  Negative for chest pain.  Gastrointestinal:  Negative for heartburn, nausea and vomiting.  Musculoskeletal:  Negative for myalgias.  Skin:  Negative for rash.  Neurological:  Positive for dizziness and sensory change.  Psychiatric/Behavioral:  Positive for depression, hallucinations, substance abuse and suicidal ideas. The patient has insomnia.    Blood pressure (!) 140/89, pulse 72, temperature 97.8 F (36.6 C), temperature source Oral, resp. rate 16, SpO2 100%. There is no height or weight on file to calculate BMI.  Musculoskeletal: Strength & Muscle Tone: within normal limits Gait & Station: normal Patient leans: N/A   [facility] MSE Discharge Disposition for Follow up and Recommendations: Based on my evaluation the patient does not appear to have an emergency medical condition and can be discharged with resources and follow up care in outpatient services for schizophrenia and drug use. Shelter and outpatient psychiatry. Pt is homeless and does not f/u outpatient.    Garvin JINNY Gaines, MD 04/29/2024, 4:30 PM

## 2024-04-30 ENCOUNTER — Other Ambulatory Visit: Payer: Self-pay

## 2024-04-30 ENCOUNTER — Emergency Department (HOSPITAL_COMMUNITY)
Admission: EM | Admit: 2024-04-30 | Discharge: 2024-04-30 | Disposition: A | Discharge status: Home / Self Care | Source: Home / Self Care

## 2024-04-30 DIAGNOSIS — B351 Tinea unguium: Secondary | ICD-10-CM | POA: Insufficient documentation

## 2024-04-30 MED ORDER — ACETAMINOPHEN 325 MG PO TABS
650.0000 mg | ORAL_TABLET | Freq: Once | ORAL | Status: AC
  Administered 2024-04-30: 650 mg via ORAL
  Filled 2024-04-30: qty 2

## 2024-04-30 NOTE — ED Provider Notes (Signed)
  [place] EMERGENCY DEPARTMENT AT [facility] Provider Note   CSN: [id number] Arrival date & time: 04/30/24  9841     Patient presents with: No chief complaint on file.   Aaron Rubio is a 43 y.o. male.  Patient presents the emergency room complaining of needing a warm place to stay.  He also complains of fungal infection to his toenails.  He denies other complaints   HPI     Prior to Admission medications   Medication Sig Start Date End Date Taking? Authorizing Provider  risperiDONE  (RISPERDAL ) 1 MG tablet Take 1 tablet (1 mg total) by mouth 2 (two) times daily. Patient not taking: Reported on 04/23/2024 04/18/24   Lenard Calin, MD    Allergies: Patient has no known allergies.    Review of Systems  Updated Vital Signs BP (!) 141/94   Pulse 87   Temp 98.1 F (36.7 C)   Resp 16   SpO2 100%   Physical Exam HENT:     Head: Normocephalic and atraumatic.  Eyes:     Pupils: Pupils are equal, round, and reactive to light.  Pulmonary:     Effort: Pulmonary effort is normal. No respiratory distress.  Musculoskeletal:        General: No signs of injury.     Cervical back: Normal range of motion.     Comments: Onychomycosis of toenails of left foot  Skin:    General: Skin is dry.  Neurological:     Mental Status: He is alert.  Psychiatric:        Speech: Speech normal.        Behavior: Behavior normal.     (all labs ordered are listed, but only abnormal results are displayed) Labs Reviewed - No data to display  EKG: None  Radiology: No results found.   Procedures   Medications Ordered in the ED  acetaminophen  (TYLENOL ) tablet 650 mg (has no administration in time range)                                    Medical Decision Making  Patient well-known to this department with 45 visits in the past 6 months.  He was seen earlier this evening for a similar complaint including both feet at that time.  He is homeless.  He was provided a  large bag, a drink, and Tylenol .  He was allowed to warm up for a period of time.  No indication for further emergent workup.  He should follow-up with a primary care provider for further treatment of his onychomycosis.  He is stable for discharge.  Return precautions provided.     Final diagnoses:  Onychomycosis    ED Discharge Orders     None          Logan Ubaldo KATHEE DEVONNA 04/30/24 9485    Griselda Norris, MD 04/30/24 [phone]

## 2024-04-30 NOTE — ED Triage Notes (Signed)
 Patient just discharged several minutes ago at this ER , checked back in - states  it's cold outside .

## 2024-04-30 NOTE — ED Notes (Signed)
 Aaron Rubio. PA evaluating /speaking with patient at this time .

## 2024-04-30 NOTE — Discharge Instructions (Addendum)
Return to the emergency department if you develop any life-threatening symptoms.

## 2024-05-01 ENCOUNTER — Other Ambulatory Visit: Payer: Self-pay

## 2024-05-01 ENCOUNTER — Encounter (HOSPITAL_COMMUNITY): Payer: Self-pay

## 2024-05-01 ENCOUNTER — Emergency Department (HOSPITAL_COMMUNITY): Admission: EM | Admit: 2024-05-01 | Discharge: 2024-05-02 | Disposition: A | Discharge status: Home / Self Care

## 2024-05-01 DIAGNOSIS — F1490 Cocaine use, unspecified, uncomplicated: Secondary | ICD-10-CM | POA: Insufficient documentation

## 2024-05-01 DIAGNOSIS — D72829 Elevated white blood cell count, unspecified: Secondary | ICD-10-CM | POA: Diagnosis not present

## 2024-05-01 DIAGNOSIS — F259 Schizoaffective disorder, unspecified: Secondary | ICD-10-CM | POA: Diagnosis present

## 2024-05-01 DIAGNOSIS — Z765 Malingerer [conscious simulation]: Secondary | ICD-10-CM | POA: Insufficient documentation

## 2024-05-01 DIAGNOSIS — F250 Schizoaffective disorder, bipolar type: Secondary | ICD-10-CM | POA: Diagnosis present

## 2024-05-01 DIAGNOSIS — I10 Essential (primary) hypertension: Secondary | ICD-10-CM | POA: Insufficient documentation

## 2024-05-01 DIAGNOSIS — N179 Acute kidney failure, unspecified: Secondary | ICD-10-CM | POA: Insufficient documentation

## 2024-05-01 DIAGNOSIS — Z5900 Homelessness unspecified: Secondary | ICD-10-CM | POA: Diagnosis not present

## 2024-05-01 DIAGNOSIS — F1410 Cocaine abuse, uncomplicated: Secondary | ICD-10-CM | POA: Diagnosis present

## 2024-05-01 DIAGNOSIS — R45851 Suicidal ideations: Secondary | ICD-10-CM | POA: Diagnosis not present

## 2024-05-01 LAB — CBC WITH DIFFERENTIAL/PLATELET
Abs Immature Granulocytes: 0.05 K/uL (ref 0.00–0.07)
Basophils Absolute: 0.1 K/uL (ref 0.0–0.1)
Basophils Relative: 1 %
Eosinophils Absolute: 0.4 K/uL (ref 0.0–0.5)
Eosinophils Relative: 3 %
HCT: 40.9 % (ref 39.0–52.0)
Hemoglobin: 13 g/dL (ref 13.0–17.0)
Immature Granulocytes: 1 %
Lymphocytes Relative: 25 %
Lymphs Abs: 2.7 K/uL (ref 0.7–4.0)
MCH: 25.9 pg — ABNORMAL LOW (ref 26.0–34.0)
MCHC: 31.8 g/dL (ref 30.0–36.0)
MCV: 81.6 fL (ref 80.0–100.0)
Monocytes Absolute: 1.2 K/uL — ABNORMAL HIGH (ref 0.1–1.0)
Monocytes Relative: 11 %
Neutro Abs: 6.5 K/uL (ref 1.7–7.7)
Neutrophils Relative %: 59 %
Platelets: 248 K/uL (ref 150–400)
RBC: 5.01 MIL/uL (ref 4.22–5.81)
RDW: 13.9 % (ref 11.5–15.5)
WBC: 10.9 K/uL — ABNORMAL HIGH (ref 4.0–10.5)
nRBC: 0 % (ref 0.0–0.2)

## 2024-05-01 LAB — COMPREHENSIVE METABOLIC PANEL WITH GFR
ALT: 25 U/L (ref 0–44)
AST: 46 U/L — ABNORMAL HIGH (ref 15–41)
Albumin: 3.5 g/dL (ref 3.5–5.0)
Alkaline Phosphatase: 59 U/L (ref 38–126)
Anion gap: 11 (ref 5–15)
BUN: 27 mg/dL — ABNORMAL HIGH (ref 6–20)
CO2: 23 mmol/L (ref 22–32)
Calcium: 8.8 mg/dL — ABNORMAL LOW (ref 8.9–10.3)
Chloride: 103 mmol/L (ref 98–111)
Creatinine, Ser: 1.36 mg/dL — ABNORMAL HIGH (ref 0.61–1.24)
GFR, Estimated: >60 mL/min (ref >60)
Glucose, Bld: 98 mg/dL (ref 70–99)
Potassium: 4 mmol/L (ref 3.5–5.1)
Sodium: 137 mmol/L (ref 135–145)
Total Bilirubin: 0.7 mg/dL (ref 0.0–1.2)
Total Protein: 5.8 g/dL — ABNORMAL LOW (ref 6.5–8.1)

## 2024-05-01 LAB — ETHANOL: Alcohol, Ethyl (B): <15 mg/dL (ref <15)

## 2024-05-01 MED ORDER — ZOLPIDEM TARTRATE 5 MG PO TABS: 5.0000 mg | ORAL_TABLET | Freq: Every evening | ORAL | Status: DC | PRN

## 2024-05-01 MED ORDER — LORAZEPAM 1 MG PO TABS: 1.0000 mg | ORAL_TABLET | ORAL | Status: DC | PRN

## 2024-05-01 MED ORDER — ZIPRASIDONE MESYLATE 20 MG IM SOLR: 20.0000 mg | INTRAMUSCULAR | Status: DC | PRN

## 2024-05-01 MED ORDER — ACETAMINOPHEN 325 MG PO TABS: 650.0000 mg | ORAL_TABLET | ORAL | Status: DC | PRN

## 2024-05-01 MED ORDER — RISPERIDONE 1 MG PO TBDP: 2.0000 mg | ORAL_TABLET | Freq: Three times a day (TID) | ORAL | Status: DC | PRN

## 2024-05-01 MED ORDER — ALUM & MAG HYDROXIDE-SIMETH 200-200-20 MG/5ML PO SUSP: 30.0000 mL | Freq: Four times a day (QID) | ORAL | Status: DC | PRN

## 2024-05-01 MED ORDER — ONDANSETRON HCL 4 MG PO TABS: 4.0000 mg | ORAL_TABLET | Freq: Three times a day (TID) | ORAL | Status: DC | PRN

## 2024-05-01 MED ORDER — NICOTINE 21 MG/24HR TD PT24
21.0000 mg | MEDICATED_PATCH | Freq: Every day | TRANSDERMAL | Status: DC
  Filled 2024-05-01: qty 1

## 2024-05-01 NOTE — ED Notes (Signed)
 Pt walked over to purple. Pt told to dress out in purple bathroom where he could not lock himself in the bathroom. Pt is still speaking to an imaginary figure. PT is complaint with requests from ED staff.

## 2024-05-01 NOTE — ED Triage Notes (Addendum)
 Pt here multiple times for same , seeing visions & hearing voices mostly at night he states , NO SI/HI , pt is homeless

## 2024-05-01 NOTE — ED Provider Notes (Signed)
 [place] EMERGENCY DEPARTMENT AT [facility] Provider Note   CSN: [id number] Arrival date & time: 05/01/24  1945     Patient presents with: Homeless   Aaron Rubio is a 43 y.o. male.   The history is provided by the patient and medical records. No language interpreter was used.     43 year old male significant history of homelessness, malingering, polysubstance use, frequent ER visits as this is his 46 visit within the past 6 months who presenting with complaint of dehydration.  Please note patient was seen in the ED yesterday for same.  Patient also states he is homicidal and suicidal and is having hallucinations.  He states he is not taking his medication due to lack of access.  He states he needs a place to stay and to be observed.  He also needs something to eat and drink.  Prior to Admission medications   Medication Sig Start Date End Date Taking? Authorizing Provider  risperiDONE  (RISPERDAL ) 1 MG tablet Take 1 tablet (1 mg total) by mouth 2 (two) times daily. Patient not taking: Reported on 04/23/2024 04/18/24   Lenard Calin, MD    Allergies: Patient has no known allergies.    Review of Systems  Psychiatric/Behavioral:  Positive for suicidal ideas.     Updated Vital Signs BP (!) 105/90 (BP Location: Right Arm)   Pulse (!) 111   Temp 98.4 F (36.9 C)   Resp 20   Ht 6' 2 (1.88 m)   Wt 96.6 kg   SpO2 97%   BMI 27.35 kg/m   Physical Exam Constitutional:      General: He is not in acute distress.    Appearance: He is well-developed.  HENT:     Head: Atraumatic.  Eyes:     Conjunctiva/sclera: Conjunctivae normal.  Musculoskeletal:     Cervical back: Normal range of motion and neck supple.  Skin:    Findings: No rash.  Neurological:     Mental Status: He is alert.  Psychiatric:        Mood and Affect: Mood normal.        Speech: Speech is tangential.        Behavior: Behavior is cooperative.        Thought Content: Thought content  includes suicidal ideation.     Comments: Resting comfortably, in no acute discomfort.     (all labs ordered are listed, but only abnormal results are displayed) Labs Reviewed  CBC WITH DIFFERENTIAL/PLATELET - Abnormal; Notable for the following components:      Result Value   WBC 10.9 (*)    MCH 25.9 (*)    Monocytes Absolute 1.2 (*)    All other components within normal limits  ETHANOL  COMPREHENSIVE METABOLIC PANEL WITH GFR  RAPID URINE DRUG SCREEN, HOSP PERFORMED    EKG: None ED ECG REPORT   Date: 05/01/2024  Rate: 96  Rhythm: normal sinus rhythm  QRS Axis: normal  Intervals: normal  ST/T Wave abnormalities: nonspecific T wave changes  Conduction Disutrbances:none  Narrative Interpretation:   Old EKG Reviewed: unchanged  I have personally reviewed the EKG tracing and agree with the computerized printout as noted.   Radiology: No results found.   Procedures   Medications Ordered in the ED - No data to display  Medical Decision Making Amount and/or Complexity of Data Reviewed Labs: ordered.  Risk OTC drugs. Prescription drug management.   BP (!) 105/90 (BP Location: Right Arm)   Pulse (!) 111   Temp 98.4 F (36.9 C)   Resp 20   Ht 6' 2 (1.88 m)   Wt 96.6 kg   SpO2 97%   BMI 27.35 kg/m   30:26 PM  43 year old male significant history of homelessness, malingering, polysubstance use, frequent ER visits as this is his 46 visit within the past 6 months who presenting with complaint of dehydration.  Please note patient was seen in the ED yesterday for same.  Patient also states he is homicidal and suicidal and is having hallucination.  He states he is not taking his medication.  He states he needs a place to stay and to be observed.  He also needs something to eat and drink.  He also requested for his blood work to be done.  States that he feels like he is going to kill himself and kill other people.  States he is  hearing voices and seeing visions.   patient speech is tangential but he appears to be in no acute discomfort.  Vitals are notable for blood pressure of 105/90, heart rate of 111.  No fever no hypoxia.  EMR reviewed patient has been seen and evaluated multiple times for malingering symptoms as well as having suicidal ideation.  He was seen 3 times yesterday.  However after further discussion, I felt patient would benefit from psychiatric clearance from dizziness and psychiatry.  Patient is here voluntarily.  -Labs ordered, independently viewed and interpreted by me.  Labs remarkable for WBC 10.9.  Cr 1.36 likely reflect mild AKI.  Pt can tolerates PO, therefore recommend PO fluid intake -The patient was maintained on a cardiac monitor.  I personally viewed and interpreted the cardiac monitored which showed an underlying rhythm of: sinus tachycardia -This patient presents to the ED for concern of SI, this involves an extensive number of treatment options, and is a complaint that carries with it a high risk of complications and morbidity.  The differential diagnosis includes depression, psychosis, malingering, substance induced mood instability -Co morbidities that complicate the patient evaluation includes homelessness, malingering, substance use, schizophrenia -Treatment includes supportive care -Reevaluation of the patient after these medicines showed that the patient improved -PCP office notes or outside notes reviewed -Discussion with TTS and Psych for further psychiatric assessment.  Pt is medically cleared -Escalation to admission/observation considered: patient agreeable with psych assessment.      Final diagnoses:  Suicidal ideation  Homelessness    ED Discharge Orders     None          Nivia Colon, PA-C 05/01/24 2333    [name], MD 05/02/24 [place]

## 2024-05-01 NOTE — ED Notes (Signed)
 Witnessed patient walking out of ER at this time.

## 2024-05-02 ENCOUNTER — Encounter (HOSPITAL_COMMUNITY): Payer: Self-pay | Admitting: Psychiatry

## 2024-05-02 DIAGNOSIS — F259 Schizoaffective disorder, unspecified: Secondary | ICD-10-CM

## 2024-05-02 DIAGNOSIS — Z765 Malingerer [conscious simulation]: Secondary | ICD-10-CM

## 2024-05-02 DIAGNOSIS — F1490 Cocaine use, unspecified, uncomplicated: Secondary | ICD-10-CM

## 2024-05-02 DIAGNOSIS — Z5900 Homelessness unspecified: Secondary | ICD-10-CM

## 2024-05-02 MED ORDER — RISPERIDONE 1 MG PO TABS: 1.0000 mg | ORAL_TABLET | Freq: Two times a day (BID) | ORAL | 0 refills | Status: DC

## 2024-05-02 MED ORDER — RISPERIDONE 1 MG PO TBDP
1.0000 mg | ORAL_TABLET | Freq: Two times a day (BID) | ORAL | Status: DC
  Administered 2024-05-02: 1 mg via ORAL
  Filled 2024-05-02: qty 1

## 2024-05-02 MED ORDER — ZOLPIDEM TARTRATE 5 MG PO TABS
5.0000 mg | ORAL_TABLET | Freq: Every evening | ORAL | 0 refills | Status: DC | PRN
Start: 2024-05-02 — End: 2024-05-06

## 2024-05-02 NOTE — BH Assessment (Signed)
 Patient was deferred to IRIS for a telepsych assessment. The assigned care coordinator will provide updates regarding the scheduling of the assessment. IRIS coordinator can be reached at [phone] for further information on the timing of the telepsych evaluation.

## 2024-05-02 NOTE — Discharge Instructions (Addendum)
 Aaron Rubio  Thank you for allowing us  to take care of you today.  You came to the Emergency Department today because you were having worsening of your mental health.  After observing you here in the emergency department the psychiatry team now feels that you are stable for discharge and outpatient follow-up.  We are sending your medication refills into your pharmacy.  We recommend to continue to follow-up with your outpatient team.   To-Do: 1. Please follow-up with your primary doctor within 7 days / as soon as possible.   Please return to the Emergency Department or call 911 if you experience have worsening of your symptoms, or do not get better, chest pain, shortness of breath, severe or significantly worsening pain, high fever, severe confusion, pass out or have any reason to think that you need emergency medical care.   We hope you feel better soon.   Mitzie Later, MD Department of Emergency Medicine [facility] [place]
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
## 2024-05-02 NOTE — ED Provider Notes (Incomplete Revision)
  [place] EMERGENCY DEPARTMENT AT [facility] Emergency Medicine Observation Re-evaluation Note  Aaron Rubio is a 43 y.o. male, seen on rounds today.  Pt initially presented on 05/01/24 at 1945 to the ED for complaints of  Chief Complaint  Patient presents with   Homeless  Patient with history of frequent ED visits felt to have a component of secondary gain, presented on 10/30, and complained of SI, HI, and hallucinations, which he attributed to barriers to obtaining his outpatient medications PMHx: Housing instability, polysubstance use disorder Currently, the patient is eating breakfast.  Physical Exam  BP (!) 104/57 (BP Location: Right Arm)   Pulse 94   Temp 98.6 F (37 C) (Oral)   Resp 16   Ht 6' 2 (1.88 m)   Wt 96.6 kg   SpO2 97%   BMI 27.34 kg/m  Physical Exam General: NAD Lungs: Normal effort Psych: Currently calm  ED Course / MDM  EKG:EKG Interpretation Date/Time:  Thursday May 01 2024 22:58:18 EDT Ventricular Rate:  96 PR Interval:  150 QRS Duration:  86 QT Interval:  366 QTC Calculation: 462 R Axis:   -13  Text Interpretation: Normal sinus rhythm Nonspecific T wave abnormality Prolonged QT Abnormal ECG When compared with ECG of 22-Apr-2024 23:05, No significant change was found Reconfirmed by Raford Lenis ([id number]) on 05/01/2024 11:35:09 PM  I have reviewed the labs performed to date as well as medications administered while in observation.  Recent changes in the last 24 hours include patient was evaluated by psychiatry overnight who recommended inpatient hospitalization. Home medications: Reordered by prior team Diet: Reordered by prior team  Plan  Current plan is for awaiting placement.   Rogelia Jerilynn RAMAN, MD 05/02/24 [phone]

## 2024-05-02 NOTE — ED Provider Notes (Addendum)
  [place] EMERGENCY DEPARTMENT AT [facility] Emergency Medicine Observation Re-evaluation Note  Aaron Rubio is a 43 y.o. male, seen on rounds today.  Pt initially presented on 05/01/24 at 1945 to the ED for complaints of  Chief Complaint  Patient presents with   Homeless  Patient with history of frequent ED visits felt to have a component of secondary gain, presented on 10/30, and complained of SI, HI, and hallucinations, which he attributed to barriers to obtaining his outpatient medications PMHx: Housing instability, polysubstance use disorder Currently, the patient is eating breakfast.  Physical Exam  BP (!) 104/57 (BP Location: Right Arm)   Pulse 94   Temp 98.6 F (37 C) (Oral)   Resp 16   Ht 6' 2 (1.88 m)   Wt 96.6 kg   SpO2 97%   BMI 27.34 kg/m  Physical Exam General: NAD Lungs: Normal effort Psych: Currently calm  ED Course / MDM  EKG:EKG Interpretation Date/Time:  Thursday May 01 2024 22:58:18 EDT Ventricular Rate:  96 PR Interval:  150 QRS Duration:  86 QT Interval:  366 QTC Calculation: 462 R Axis:   -13  Text Interpretation: Normal sinus rhythm Nonspecific T wave abnormality Prolonged QT Abnormal ECG When compared with ECG of 22-Apr-2024 23:05, No significant change was found Reconfirmed by Raford Lenis ([id number]) on 05/01/2024 11:35:09 PM  I have reviewed the labs performed to date as well as medications administered while in observation.  Recent changes in the last 24 hours include patient was evaluated by psychiatry overnight who recommended inpatient hospitalization. Home medications: Reordered by prior team Diet: Reordered by prior team  Plan  Current plan is for awaiting placement.  Patient was reevaluated by psychiatry today who recommended that he is stable for discharge and outpatient management, recommended continuation of his twice daily risperidone , as well as nightly as needed Ambien .  Patient was reevaluated, patient is  amenable to this plan, discharged and these prescriptions were sent to the patient's preferred outpatient pharmacy.   Rogelia Jerilynn RAMAN, MD 05/02/24 [id number]    Rogelia Jerilynn RAMAN, MD 05/03/24 [phone]

## 2024-05-02 NOTE — ED Notes (Signed)
 Assuming pt care, pt laying in bed, speaking to self, loose thoughts. Denies SI/HI, pt wanded by security, room arranged for safety, all belongings taken to designated area, pt changed in to paper scrubs

## 2024-05-02 NOTE — Consult Note (Signed)
 [facility] Psychiatric Consult Follow-up  Patient Name: .Aaron Rubio  MRN: [id number]  DOB: [birth year]  Consult Order details:  Orders (From admission, onward)     Start     Ordered   05/01/24 2252  CONSULT TO CALL ACT TEAM       Ordering Provider: Nivia Colon, PA-C  Provider:  (Not yet assigned)  Question:  Reason for Consult?  Answer:  Psych consult   05/01/24 2251             Mode of Visit: In person    Psychiatry Consult Evaluation  Service Date: May 02, 2024 LOS:  LOS: 0 days  Chief Complaint   Primary Psychiatric Diagnoses  Schizoaffective Disorder 2.  Cocaine Use Disorder 3.  Homelessness/Malingering  Assessment  Aaron Rubio is a 43 y.o. male admitted: Presented to the EDfor 05/01/2024  7:48 PM. He carries the psychiatric diagnoses of Schizoaffective Disorder, polysubstance use and homelessness  and has a past medical history of  Hypertension.   His current presentation of hallucinations  is most consistent with his past symptoms  which were previously addressed  and related treatment was recommended. He meets criteria for ACTT services  based on past and current  symptoms.  Current outpatient psychotropic medications include Risperidone  and Ambien   and historically he has had a therapeutic  response to these medications. He was not  compliant with medications prior to admission as evidenced by his increased hallucinations. On initial examination, patient is talking to himself but has no sign of distress. Please see plan below for detailed recommendations.   Diagnoses:  Active Hospital problems: Active Problems:   Cocaine use disorder (HCC)   Schizoaffective disorder (HCC)    Plan   ## Psychiatric Medication Recommendations:  We recommend to continue home medications:  Risperidone  1 mg PO BID Ambien  5 mg PO HS PRN  We recommend ACTT services as patient continues to experience medication non-adherence  and his psychotic symptoms continue  to increase.   ## Medical Decision Making Capacity: Not specifically addressed in this encounter  ## Further Work-up:  -- NA UDS -- most recent EKG (see chart) -- Pertinent labwork reviewed earlier this admission includes: All labs reviewed   ## Disposition:-- There are no psychiatric contraindications to discharge at this time  ## Behavioral / Environmental: - No specific recommendations at this time.     ## Safety and Observation Level:  - Based on my clinical evaluation, I estimate the patient to be at low risk of self harm in the current setting. - At this time, we recommend  routine. This decision is based on my review of the chart including patient's history and current presentation, interview of the patient, mental status examination, and consideration of suicide risk including evaluating suicidal ideation, plan, intent, suicidal or self-harm behaviors, risk factors, and protective factors. This judgment is based on our ability to directly address suicide risk, implement suicide prevention strategies, and develop a safety plan while the patient is in the clinical setting. Please contact our team if there is a concern that risk level has changed.  CSSR Risk Category:C-SSRS RISK CATEGORY: Error: Q3, 4, or 5 should not be populated when Q2 is No  Suicide Risk Assessment: Patient has following modifiable risk factors for suicide: active mental illness (to encompass adhd, tbi, mania, psychosis, trauma reaction) and recent psychiatric hospitalization, which we are addressing by recommending ACTT services . Patient has following non-modifiable or demographic risk factors for suicide: male gender and  psychiatric hospitalization Patient has the following protective factors against suicide: no history of suicide attempts  Thank you for this consult request. Recommendations have been communicated to the primary team.  We will recommend ACTT services at this time, for medication management and  community support  [name], NP       History of Present Illness  Relevant Aspects of Hospital ED Course:  Admitted on 05/01/2024.   Patient Report:   Per previous evaluation:   Patient presented with acute psychosis ,  and was  responding to internal stimuli with  thought blocking. He was  incoherent and disorganized,and  laughing inappropriately. He was concerned about why provider asked  for his name, he was easily distracted difficulty with abstract thinking; appeared  to have limited insight and judgment.  denied symptoms of depression with anergia, anhedonia, amotivation, no anxiety, He was  poor historian during  encounter. Due to  his  psychosis state,  the evaluating provider suggested considering  involvement of family or APS as concerns regarding pt ability to care for self and may need guardianship.   Upon reassessment today 05/02/2024:     Patient is seen in bed awake. He is talking to himself. Upon approach, patient states I have talked to a psychologist, what do you want again ? [name]  He is not conveying meaningful conversation; speech is disorganized with flight of ideas and word salad. However, he is able to state that they wanted me to go home, I don't want to go home. When asked why he does not want to go home, patient becomes agitated saying you don't even speak English, you don't know what I mean.  Patient does not appear to be in any acute distress. He denies SI/HI. Chart review indicates frequent visits to this system for homelessness. Has had multiple hospitalizations and would request to be discharged before treatment is completed. He presents no risk for self harm.  Dr Merilee is consulted and, from out observation, another hospitalization today would not make any change to his behaviors. We recommend ACTT services to address his medications non-compliance and his need of community support services.   Psych ROS:  Depression: NA Anxiety:  NA Mania  (lifetime and current): NA Psychosis: (lifetime and current): reported  Collateral information:  Contacted : NA  Review of Systems  Constitutional: Negative.   HENT: Negative.    Eyes: Negative.   Respiratory: Negative.    Cardiovascular: Negative.   Gastrointestinal: Negative.   Genitourinary: Negative.   Musculoskeletal: Negative.   Skin: Negative.   Neurological: Negative.   Endo/Heme/Allergies: Negative.   Psychiatric/Behavioral:  Positive for hallucinations and substance abuse.      Psychiatric and Social History  Psychiatric History:  Information collected from patient, chart, nursing  Prev Dx/Sx: Schisophrenia, substance abuse Current Psych Provider: NA Home Meds (current): Risperidone , Ambien  Previous Med Trials: NA Therapy: NA  Prior Psych Hospitalization: multiple inpatients Reported  Prior Self Harm: NA Prior Violence: Reported  Family Psych History: NA Family Hx suicide: NA  Social History:  Developmental Hx: NA Educational Hx: NA Occupational Hx: NA Legal Hx: Reported Living Situation: Homeless Spiritual Hx: NA Access to weapons/lethal means: na   Substance History Alcohol: : Unknown  Type of alcohol : NA Last Drink : NA Number of drinks per day : NA History of alcohol withdrawal seizures : NA History of DT's : NA Tobacco: NA Illicit drugs: Crack/cocaine Prescription drug abuse: NA Rehab hx: NA  Exam Findings  Physical Exam:  Vital Signs:  Temp:  [98.4 F (36.9 C)-98.6 F (37 C)] 98.6 F (37 C) (10/31 0328) Pulse Rate:  [94-111] 94 (10/31 0328) Resp:  [16-20] 16 (10/31 0328) BP: (104-105)/(57-90) 104/57 (10/31 0328) SpO2:  [97 %] 97 % (10/31 0328) Weight:  [96.6 kg] 96.6 kg (10/31 0328) Blood pressure (!) 104/57, pulse 94, temperature 98.6 F (37 C), temperature source Oral, resp. rate 16, height 6' 2 (1.88 m), weight 96.6 kg, SpO2 97%. Body mass index is 27.34 kg/m.  Physical Exam Vitals and nursing note reviewed.  HENT:      Head: Normocephalic and atraumatic.     Right Ear: Tympanic membrane normal.     Left Ear: Tympanic membrane normal.     Nose: Nose normal.  Eyes:     Extraocular Movements: Extraocular movements intact.     Pupils: Pupils are equal, round, and reactive to light.  Cardiovascular:     Rate and Rhythm: Normal rate.     Pulses: Normal pulses.  Pulmonary:     Effort: Pulmonary effort is normal.  Musculoskeletal:        General: Normal range of motion.     Cervical back: Normal range of motion and neck supple.  Neurological:     General: No focal deficit present.     Mental Status: He is alert.     Mental Status Exam: General Appearance: Bizarre and Casual  Orientation:  Other:  partial  Memory:  Recent;   Poor Remote;   Poor  Concentration:  Concentration: Poor and Attention Span: Poor  Recall:  Poor  Attention  Fair  Eye Contact:  Fair  Speech:  Garbled  Language:  Fair  Volume:  Normal  Mood: UTA  Affect:  Labile  Thought Process:  Disorganized  Thought Content:  Illogical  Suicidal Thoughts:  No  Homicidal Thoughts:  No  Judgement:  Fair  Insight:  Shallow  Psychomotor Activity:  At baseline  Akathisia:  NA  Fund of Knowledge:  Fair      Assets:  Desire for Improvement Physical Health  Cognition:  Impaired,  Mild  ADL's:  Intact  AIMS (if indicated):   NA     Other History   These have been pulled in through the EMR, reviewed, and updated if appropriate.  Family History:  The patient's family history is not on file.  Medical History: Past Medical History:  Diagnosis Date   Hypertension    Schizophrenia [facility])     Surgical History: History reviewed. No pertinent surgical history.   Medications:   Current Facility-Administered Medications:    acetaminophen  (TYLENOL ) tablet 650 mg, 650 mg, Oral, Q4H PRN, Tran, Bowie, PA-C   alum & mag hydroxide-simeth (MAALOX/MYLANTA) 200-200-20 MG/5ML suspension 30 mL, 30 mL, Oral, Q6H PRN, Tran, Bowie, PA-C    risperiDONE  (RISPERDAL  M-TABS) disintegrating tablet 2 mg, 2 mg, Oral, Q8H PRN **AND** LORazepam  (ATIVAN ) tablet 1 mg, 1 mg, Oral, PRN **AND** ziprasidone  (GEODON ) injection 20 mg, 20 mg, Intramuscular, PRN, Tran, Bowie, PA-C   nicotine (NICODERM CQ - dosed in mg/24 hours) patch 21 mg, 21 mg, Transdermal, Daily, Tran, Bowie, PA-C   ondansetron  (ZOFRAN ) tablet 4 mg, 4 mg, Oral, Q8H PRN, Tran, Bowie, PA-C   risperiDONE  (RISPERDAL  M-TABS) disintegrating tablet 1 mg, 1 mg, Oral, BID, Moore, Virginia  G, NP   zolpidem  (AMBIEN ) tablet 5 mg, 5 mg, Oral, QHS PRN, Tran, Bowie, PA-C  Current Outpatient Medications:    lisinopril (ZESTRIL) 5 MG tablet, Take 5 mg by mouth  daily., Disp: , Rfl:    risperiDONE  (RISPERDAL ) 1 MG tablet, Take 1 tablet (1 mg total) by mouth 2 (two) times daily. (Patient not taking: Reported on 04/23/2024), Disp: 7 tablet, Rfl: 0  Allergies: No Known Allergies  [name], NP

## 2024-05-03 ENCOUNTER — Encounter (HOSPITAL_COMMUNITY): Payer: Self-pay

## 2024-05-03 ENCOUNTER — Emergency Department (HOSPITAL_COMMUNITY)

## 2024-05-03 ENCOUNTER — Emergency Department (HOSPITAL_COMMUNITY)
Admission: EM | Admit: 2024-05-03 | Discharge: 2024-05-04 | Disposition: A | Discharge status: Home / Self Care | Source: Home / Self Care | Attending: Emergency Medicine | Admitting: Emergency Medicine

## 2024-05-03 ENCOUNTER — Other Ambulatory Visit: Payer: Self-pay

## 2024-05-03 ENCOUNTER — Emergency Department (HOSPITAL_COMMUNITY)
Admission: EM | Admit: 2024-05-03 | Discharge: 2024-05-03 | Disposition: A | Discharge status: Home / Self Care | Attending: Emergency Medicine | Admitting: Emergency Medicine

## 2024-05-03 ENCOUNTER — Encounter (HOSPITAL_COMMUNITY): Payer: Self-pay | Admitting: *Deleted

## 2024-05-03 DIAGNOSIS — R6889 Other general symptoms and signs: Secondary | ICD-10-CM | POA: Diagnosis not present

## 2024-05-03 DIAGNOSIS — J069 Acute upper respiratory infection, unspecified: Secondary | ICD-10-CM | POA: Diagnosis not present

## 2024-05-03 DIAGNOSIS — Z79899 Other long term (current) drug therapy: Secondary | ICD-10-CM | POA: Diagnosis not present

## 2024-05-03 DIAGNOSIS — I10 Essential (primary) hypertension: Secondary | ICD-10-CM | POA: Insufficient documentation

## 2024-05-03 DIAGNOSIS — R059 Cough, unspecified: Secondary | ICD-10-CM | POA: Diagnosis present

## 2024-05-03 DIAGNOSIS — M79671 Pain in right foot: Secondary | ICD-10-CM | POA: Insufficient documentation

## 2024-05-03 DIAGNOSIS — M79672 Pain in left foot: Secondary | ICD-10-CM | POA: Insufficient documentation

## 2024-05-03 DIAGNOSIS — M25572 Pain in left ankle and joints of left foot: Secondary | ICD-10-CM | POA: Diagnosis not present

## 2024-05-03 LAB — RESP PANEL BY RT-PCR (RSV, FLU A&B, COVID)  RVPGX2
Influenza A by PCR: NEGATIVE
Influenza B by PCR: NEGATIVE
Resp Syncytial Virus by PCR: NEGATIVE
SARS Coronavirus 2 by RT PCR: NEGATIVE

## 2024-05-03 NOTE — ED Triage Notes (Signed)
 Patient states he is he dehydrated, hypothermic, and needs to a mental health evaluation.

## 2024-05-03 NOTE — ED Triage Notes (Signed)
 Pt reports that he came down with a cold after being here 2 nights in a row. Runny nose noted.

## 2024-05-03 NOTE — Discharge Instructions (Signed)
 Chest x-ray is normal.  No pneumonia.  Keep yourself hydrated.  Use Tylenol  or Motrin  as needed for aches and for fevers.  Return to the ED with difficulty breathing, chest pain, or other concerns.

## 2024-05-03 NOTE — ED Provider Notes (Signed)
 [place] EMERGENCY DEPARTMENT AT [facility] Provider Note   CSN: [id number] Arrival date & time: 05/03/24  0151     Patient presents with: URI   Aaron Rubio is a 43 y.o. male.   Patient well-known to the ED.  History of hypertension and schizophrenia here with URI.  States he was here 24 hours ago and since then has had a cough, congestion, chills but no fever.  Cough is nonproductive.  No chest pain or shortness of breath.  Poor appetite but no vomiting or nausea or diarrhea.  Some headache and body aches and chills but has not checked his temperature.  No travel or sick contacts.  The history is provided by the patient.  URI Presenting symptoms: congestion, cough, fatigue and rhinorrhea   Presenting symptoms: no fever   Associated symptoms: no arthralgias, no headaches and no myalgias        Prior to Admission medications   Medication Sig Start Date End Date Taking? Authorizing Provider  lisinopril (ZESTRIL) 5 MG tablet Take 5 mg by mouth daily. Patient not taking: Reported on 05/02/2024    [provider]  risperiDONE  (RISPERDAL ) 1 MG tablet Take 1 tablet (1 mg total) by mouth 2 (two) times daily. 05/02/24   Rogelia Jerilynn RAMAN, MD  zolpidem  (AMBIEN ) 5 MG tablet Take 1 tablet (5 mg total) by mouth at bedtime as needed for up to 7 doses for sleep. 05/02/24   Rogelia Jerilynn RAMAN, MD    Allergies: Patient has no known allergies.    Review of Systems  Constitutional:  Positive for activity change, appetite change and fatigue. Negative for fever.  HENT:  Positive for congestion and rhinorrhea.   Respiratory:  Positive for cough. Negative for chest tightness and shortness of breath.   Cardiovascular:  Negative for chest pain.  Gastrointestinal:  Negative for diarrhea and vomiting.  Genitourinary:  Negative for dysuria and hematuria.  Musculoskeletal:  Negative for arthralgias and myalgias.  Skin:  Negative for rash.  Neurological:  Negative for  weakness and headaches.   all other systems are negative except as noted in the HPI and PMH.    Updated Vital Signs BP (!) 150/90 (BP Location: Right Arm)   Pulse 86   Temp 97.6 F (36.4 C)   Resp 18   SpO2 100%   Physical Exam Vitals and nursing note reviewed.  Constitutional:      General: He is not in acute distress.    Appearance: He is well-developed. He is not ill-appearing.  HENT:     Head: Normocephalic and atraumatic.     Nose: Congestion present.     Mouth/Throat:     Pharynx: No oropharyngeal exudate.  Eyes:     Conjunctiva/sclera: Conjunctivae normal.     Pupils: Pupils are equal, round, and reactive to light.  Neck:     Comments: No meningismus. Cardiovascular:     Rate and Rhythm: Normal rate and regular rhythm.     Heart sounds: Normal heart sounds. No murmur heard. Pulmonary:     Effort: Pulmonary effort is normal. No respiratory distress.     Breath sounds: Normal breath sounds.  Abdominal:     Palpations: Abdomen is soft.     Tenderness: There is no abdominal tenderness. There is no guarding or rebound.  Musculoskeletal:        General: No tenderness. Normal range of motion.     Cervical back: Normal range of motion and neck supple.  Skin:  General: Skin is warm.  Neurological:     Mental Status: He is alert and oriented to person, place, and time.     Cranial Nerves: No cranial nerve deficit.     Motor: No abnormal muscle tone.     Coordination: Coordination normal.     Comments:  5/5 strength throughout. CN 2-12 intact.Equal grip strength.   Psychiatric:        Behavior: Behavior normal.     (all labs ordered are listed, but only abnormal results are displayed) Labs Reviewed  RESP PANEL BY RT-PCR (RSV, FLU A&B, COVID)  [id number]    EKG: None  Radiology: DG Chest 2 View Result Date: 05/03/2024 CLINICAL DATA:  Cough. EXAM: CHEST - 2 VIEW COMPARISON:  05/19/2023 FINDINGS: The heart size and mediastinal contours are within normal limits.  Both lungs are clear. The visualized skeletal structures are unremarkable. IMPRESSION: No active cardiopulmonary disease. Electronically Signed   By: Norleen DELENA Kil M.D.   On: 05/03/2024 06:16     Procedures   Medications Ordered in the ED - No data to display                                  Medical Decision Making Amount and/or Complexity of Data Reviewed Labs: ordered. Decision-making details documented in ED Course. Radiology: ordered and independent interpretation performed. Decision-making details documented in ED Course. ECG/medicine tests: ordered and independent interpretation performed. Decision-making details documented in ED Course.   2 days of URI symptoms.  Some nonproductive cough.  No chest pain or shortness of breath.  Clear lungs.  Patient is well-appearing.  No hypoxia.  No increased work of breathing or tachypnea.  No wheezing.  Chest x-ray is negative for pneumonia.  Suspect likely viral URI.  Low concern for ACS, PE, aortic dissection.  COVID and flu swab is pending. Care discussed with patient including antipyretics, p.o. hydration and PCP follow-up.     Final diagnoses:  Viral URI with cough    ED Discharge Orders     None          Noretta Frier, Garnette, MD 05/03/24 [phone]

## 2024-05-04 ENCOUNTER — Encounter (HOSPITAL_COMMUNITY): Payer: Self-pay | Admitting: *Deleted

## 2024-05-04 ENCOUNTER — Other Ambulatory Visit: Payer: Self-pay

## 2024-05-04 ENCOUNTER — Emergency Department (HOSPITAL_COMMUNITY)
Admission: EM | Admit: 2024-05-04 | Discharge: 2024-05-04 | Disposition: A | Discharge status: Home / Self Care | Attending: Emergency Medicine | Admitting: Emergency Medicine

## 2024-05-04 DIAGNOSIS — Z5900 Homelessness unspecified: Secondary | ICD-10-CM | POA: Diagnosis not present

## 2024-05-04 DIAGNOSIS — M25572 Pain in left ankle and joints of left foot: Secondary | ICD-10-CM | POA: Diagnosis present

## 2024-05-04 DIAGNOSIS — M25571 Pain in right ankle and joints of right foot: Secondary | ICD-10-CM | POA: Diagnosis not present

## 2024-05-04 DIAGNOSIS — M79671 Pain in right foot: Secondary | ICD-10-CM

## 2024-05-04 MED ORDER — ACETAMINOPHEN 325 MG PO TABS
650.0000 mg | ORAL_TABLET | Freq: Once | ORAL | Status: AC
  Administered 2024-05-04: 650 mg via ORAL
  Filled 2024-05-04: qty 2

## 2024-05-04 MED ORDER — ADULT MULTIVITAMIN W/MINERALS CH
1.0000 | ORAL_TABLET | Freq: Once | ORAL | Status: AC
  Administered 2024-05-04: 1 via ORAL
  Filled 2024-05-04: qty 1

## 2024-05-04 MED ORDER — ACETAMINOPHEN 500 MG PO TABS
1000.0000 mg | ORAL_TABLET | Freq: Once | ORAL | Status: AC
  Administered 2024-05-04: 1000 mg via ORAL
  Filled 2024-05-04: qty 2

## 2024-05-04 NOTE — ED Provider Notes (Signed)
 [place] EMERGENCY DEPARTMENT AT [facility] Provider Note   CSN: [id number] Arrival date & time: 05/03/24  2025     Patient presents with: No chief complaint on file.   Aaron Rubio is a 43 y.o. male.   43 year old male presents with concern for feeling cold, feels like he may need iron supplementation.  Also reports pain in both of his feet without injury.       Prior to Admission medications   Medication Sig Start Date End Date Taking? Authorizing Provider  lisinopril (ZESTRIL) 5 MG tablet Take 5 mg by mouth daily. Patient not taking: Reported on 05/02/2024    [provider]  risperiDONE  (RISPERDAL ) 1 MG tablet Take 1 tablet (1 mg total) by mouth 2 (two) times daily. 05/02/24   Rogelia Jerilynn RAMAN, MD  zolpidem  (AMBIEN ) 5 MG tablet Take 1 tablet (5 mg total) by mouth at bedtime as needed for up to 7 doses for sleep. 05/02/24   Rogelia Jerilynn RAMAN, MD    Allergies: Patient has no known allergies.    Review of Systems Negative except as per HPI Updated Vital Signs BP 130/80 (BP Location: Left Arm)   Pulse 74   Temp 97.8 F (36.6 C)   Resp 15   Ht 6' 2 (1.88 m)   Wt 96.6 kg   SpO2 100%   BMI 27.34 kg/m   Physical Exam Vitals and nursing note reviewed.  Constitutional:      General: He is not in acute distress.    Appearance: He is well-developed. He is not diaphoretic.  HENT:     Head: Normocephalic and atraumatic.  Cardiovascular:     Pulses: Normal pulses.  Pulmonary:     Effort: Pulmonary effort is normal.  Musculoskeletal:        General: Tenderness present. No swelling, deformity or signs of injury.     Right lower leg: No edema.     Left lower leg: No edema.  Skin:    General: Skin is warm and dry.     Findings: No erythema or rash.  Neurological:     Mental Status: He is alert and oriented to person, place, and time.     Sensory: No sensory deficit.  Psychiatric:        Behavior: Behavior normal.     (all labs  ordered are listed, but only abnormal results are displayed) Labs Reviewed - No data to display  EKG: None  Radiology: DG Chest 2 View Result Date: 05/03/2024 CLINICAL DATA:  Cough. EXAM: CHEST - 2 VIEW COMPARISON:  05/19/2023 FINDINGS: The heart size and mediastinal contours are within normal limits. Both lungs are clear. The visualized skeletal structures are unremarkable. IMPRESSION: No active cardiopulmonary disease. Electronically Signed   By: Norleen DELENA Kil M.D.   On: 05/03/2024 06:16     Procedures   Medications Ordered in the ED  acetaminophen  (TYLENOL ) tablet 650 mg (has no administration in time range)  multivitamin with minerals tablet 1 tablet (has no administration in time range)                                    Medical Decision Making  43 year old male presents emergency room with complaint of pain in his feet without injury, the feet are normal in appearance, DP pulses present, 2+ bilaterally, skin is intact, feet are warm, pink, dry.  Provided with Tylenol  for pain in  his feet.  In regards to feeling cold/chilled and request for iron supplement, provided with multivitamin.     Final diagnoses:  Foot pain, bilateral  Cold feeling    ED Discharge Orders     None          Anhelica Fowers A, PA-C 05/04/24 0315    Theadore Ozell HERO, MD 05/04/24 [phone]

## 2024-05-04 NOTE — ED Provider Notes (Signed)
  [place] EMERGENCY DEPARTMENT AT [facility] Provider Note   CSN: [id number] Arrival date & time: 05/04/24  1617     Patient presents with: cold hands   Aaron Rubio is a 43 y.o. male.   Patient to ED for evaluation of bilateral foot soreness from walking.   The history is provided by the patient. No language interpreter was used.       Prior to Admission medications   Medication Sig Start Date End Date Taking? Authorizing Provider  lisinopril (ZESTRIL) 5 MG tablet Take 5 mg by mouth daily. Patient not taking: Reported on 05/02/2024    [provider]  risperiDONE  (RISPERDAL ) 1 MG tablet Take 1 tablet (1 mg total) by mouth 2 (two) times daily. 05/02/24   Rogelia Jerilynn RAMAN, MD  zolpidem  (AMBIEN ) 5 MG tablet Take 1 tablet (5 mg total) by mouth at bedtime as needed for up to 7 doses for sleep. 05/02/24   Rogelia Jerilynn RAMAN, MD    Allergies: Patient has no known allergies.    Review of Systems  Updated Vital Signs BP (!) 150/96   Pulse 70   Temp 98.3 F (36.8 C)   Resp 14   Ht 6' 2 (1.88 m)   Wt 96.6 kg   SpO2 96%   BMI 27.34 kg/m   Physical Exam Constitutional:      Appearance: He is well-developed.  Pulmonary:     Effort: Pulmonary effort is normal.  Musculoskeletal:        General: Normal range of motion.     Cervical back: Normal range of motion.     Comments: Feet are not swollen. No redness. No skin breakdown. Nail intact. Warm with normal Cap RF. No deformity.   Skin:    General: Skin is warm and dry.  Neurological:     Mental Status: He is alert and oriented to person, place, and time.     (all labs ordered are listed, but only abnormal results are displayed) Labs Reviewed - No data to display  EKG: None  Radiology: DG Chest 2 View Result Date: 05/03/2024 CLINICAL DATA:  Cough. EXAM: CHEST - 2 VIEW COMPARISON:  05/19/2023 FINDINGS: The heart size and mediastinal contours are within normal limits. Both lungs are  clear. The visualized skeletal structures are unremarkable. IMPRESSION: No active cardiopulmonary disease. Electronically Signed   By: Norleen DELENA Kil M.D.   On: 05/03/2024 06:16     Procedures   Medications Ordered in the ED  acetaminophen  (TYLENOL ) tablet 1,000 mg (has no administration in time range)    Clinical Course as of 05/04/24 1734  Sun May 04, 2024  1733 Patient with foot pain from walking. Reassured there is no infection, vascular compromise or injury. He endorses checking back into the department after being seen x 2 yesterday because he wants to be out of the cold.  [SU]    Clinical Course User Index [SU] Odell Balls, PA-C                                 Medical Decision Making Risk OTC drugs.        Final diagnoses:  Bilateral foot pain  Homeless    ED Discharge Orders     None          Odell Balls, PA-C 05/04/24 1734    Pamella Ozell DELENA, DO 05/06/24 0117

## 2024-05-04 NOTE — Discharge Instructions (Signed)
 Take Tylenol  for foot soreness.

## 2024-05-04 NOTE — ED Notes (Signed)
 No answer in the waiting room

## 2024-05-04 NOTE — ED Triage Notes (Signed)
 Frequent vivitor  he was here x 2 yesterday and was still in the dept at 0400am this am   he's here to sleep

## 2024-05-05 ENCOUNTER — Emergency Department (HOSPITAL_COMMUNITY)
Admission: EM | Admit: 2024-05-05 | Discharge: 2024-05-05 | Disposition: A | Discharge status: Home / Self Care | Attending: Emergency Medicine | Admitting: Emergency Medicine

## 2024-05-05 DIAGNOSIS — Z711 Person with feared health complaint in whom no diagnosis is made: Secondary | ICD-10-CM | POA: Insufficient documentation

## 2024-05-05 DIAGNOSIS — Z5900 Homelessness unspecified: Secondary | ICD-10-CM | POA: Diagnosis present

## 2024-05-05 NOTE — ED Provider Notes (Signed)
  [place] EMERGENCY DEPARTMENT AT [facility] Provider Note   CSN: [id number] Arrival date & time: 05/05/24  0406     Patient presents with: No complaint and Homeless   Aaron Rubio is a 43 y.o. male.   43 year old male presents emergency room without complaint.  He requests to use the restroom, is ambulatory to the restroom without difficulty.       Prior to Admission medications   Medication Sig Start Date End Date Taking? Authorizing Provider  lisinopril (ZESTRIL) 5 MG tablet Take 5 mg by mouth daily. Patient not taking: Reported on 05/02/2024    [provider]  risperiDONE  (RISPERDAL ) 1 MG tablet Take 1 tablet (1 mg total) by mouth 2 (two) times daily. 05/02/24   Rogelia Jerilynn RAMAN, MD  zolpidem  (AMBIEN ) 5 MG tablet Take 1 tablet (5 mg total) by mouth at bedtime as needed for up to 7 doses for sleep. 05/02/24   Rogelia Jerilynn RAMAN, MD    Allergies: Patient has no known allergies.    Review of Systems Negative Updated Vital Signs BP (!) 155/96 (BP Location: Right Arm)   Pulse 61   Temp (!) 97.5 F (36.4 C)   Resp 16   SpO2 100%   Physical Exam Vitals and nursing note reviewed.  Constitutional:      General: He is not in acute distress.    Appearance: He is well-developed. He is not diaphoretic.  HENT:     Head: Normocephalic and atraumatic.  Pulmonary:     Effort: Pulmonary effort is normal.  Neurological:     Mental Status: He is alert and oriented to person, place, and time.     Gait: Gait normal.  Psychiatric:        Behavior: Behavior normal.     (all labs ordered are listed, but only abnormal results are displayed) Labs Reviewed - No data to display  EKG: None  Radiology: No results found.    Procedures   Medications Ordered in the ED - No data to display                                  Medical Decision Making  43 year old maleSeen in the ER earlier today twice, was lingering in the lobby and ultimately  checked in again.  He does not have complaints.     Final diagnoses:  Homeless    ED Discharge Orders     None          Beverley Leita DELENA DEVONNA 05/05/24 [id number]    Aaron Ozell HERO, MD 05/05/24 [phone]

## 2024-05-05 NOTE — ED Triage Notes (Signed)
 Pt never left ER and was told to leave by security due to pt lingering in lobby and then pt checked back in. Pt refusing to give reason for wanting to be seen. Pt stating I have the right to be here and be seen I pay my bills

## 2024-05-05 NOTE — ED Notes (Signed)
 No complaint.  Pt slept while in H18

## 2024-05-06 ENCOUNTER — Ambulatory Visit (HOSPITAL_COMMUNITY)
Admission: EM | Admit: 2024-05-06 | Discharge: 2024-05-07 | Disposition: A | Discharge status: Other Acute Inpatient Hospital | Attending: Nurse Practitioner | Admitting: Nurse Practitioner

## 2024-05-06 ENCOUNTER — Emergency Department (HOSPITAL_COMMUNITY)
Admission: EM | Admit: 2024-05-06 | Discharge: 2024-05-06 | Disposition: A | Discharge status: Home / Self Care | Attending: Emergency Medicine | Admitting: Emergency Medicine

## 2024-05-06 ENCOUNTER — Encounter (HOSPITAL_COMMUNITY): Payer: Self-pay

## 2024-05-06 ENCOUNTER — Other Ambulatory Visit: Payer: Self-pay

## 2024-05-06 DIAGNOSIS — F1994 Other psychoactive substance use, unspecified with psychoactive substance-induced mood disorder: Secondary | ICD-10-CM

## 2024-05-06 DIAGNOSIS — Z79899 Other long term (current) drug therapy: Secondary | ICD-10-CM | POA: Diagnosis not present

## 2024-05-06 DIAGNOSIS — I10 Essential (primary) hypertension: Secondary | ICD-10-CM | POA: Diagnosis not present

## 2024-05-06 DIAGNOSIS — R443 Hallucinations, unspecified: Secondary | ICD-10-CM | POA: Diagnosis present

## 2024-05-06 DIAGNOSIS — R45851 Suicidal ideations: Secondary | ICD-10-CM

## 2024-05-06 DIAGNOSIS — F1914 Other psychoactive substance abuse with psychoactive substance-induced mood disorder: Secondary | ICD-10-CM | POA: Diagnosis not present

## 2024-05-06 DIAGNOSIS — F209 Schizophrenia, unspecified: Secondary | ICD-10-CM | POA: Diagnosis not present

## 2024-05-06 DIAGNOSIS — Z5900 Homelessness unspecified: Secondary | ICD-10-CM | POA: Diagnosis not present

## 2024-05-06 DIAGNOSIS — F200 Paranoid schizophrenia: Secondary | ICD-10-CM | POA: Diagnosis not present

## 2024-05-06 LAB — COMPREHENSIVE METABOLIC PANEL WITH GFR
ALT: 20 U/L (ref 0–44)
AST: 28 U/L (ref 15–41)
Albumin: 3.6 g/dL (ref 3.5–5.0)
Alkaline Phosphatase: 54 U/L (ref 38–126)
Anion gap: 9 (ref 5–15)
BUN: 12 mg/dL (ref 6–20)
CO2: 25 mmol/L (ref 22–32)
Calcium: 8.9 mg/dL (ref 8.9–10.3)
Chloride: 104 mmol/L (ref 98–111)
Creatinine, Ser: 1.25 mg/dL — ABNORMAL HIGH (ref 0.61–1.24)
GFR, Estimated: >60 mL/min (ref >60)
Glucose, Bld: 89 mg/dL (ref 70–99)
Potassium: 3.7 mmol/L (ref 3.5–5.1)
Sodium: 138 mmol/L (ref 135–145)
Total Bilirubin: 0.6 mg/dL (ref 0.0–1.2)
Total Protein: 6.3 g/dL — ABNORMAL LOW (ref 6.5–8.1)

## 2024-05-06 LAB — RAPID URINE DRUG SCREEN, HOSP PERFORMED
Amphetamines: NOT DETECTED
Barbiturates: NOT DETECTED
Benzodiazepines: NOT DETECTED
Cocaine: POSITIVE — AB
Opiates: NOT DETECTED
Tetrahydrocannabinol: NOT DETECTED

## 2024-05-06 LAB — ETHANOL: Alcohol, Ethyl (B): <15 mg/dL (ref <15)

## 2024-05-06 LAB — CBC
HCT: 41 % (ref 39.0–52.0)
Hemoglobin: 12.9 g/dL — ABNORMAL LOW (ref 13.0–17.0)
MCH: 25.6 pg — ABNORMAL LOW (ref 26.0–34.0)
MCHC: 31.5 g/dL (ref 30.0–36.0)
MCV: 81.5 fL (ref 80.0–100.0)
Platelets: 265 K/uL (ref 150–400)
RBC: 5.03 MIL/uL (ref 4.22–5.81)
RDW: 13.5 % (ref 11.5–15.5)
WBC: 9.6 K/uL (ref 4.0–10.5)
nRBC: 0 % (ref 0.0–0.2)

## 2024-05-06 MED ORDER — LISINOPRIL 5 MG PO TABS: 5.0000 mg | ORAL_TABLET | Freq: Once | ORAL | Status: DC

## 2024-05-06 MED ORDER — TRAZODONE HCL 50 MG PO TABS: 50.0000 mg | ORAL_TABLET | Freq: Every evening | ORAL | Status: DC | PRN

## 2024-05-06 MED ORDER — HYDROXYZINE HCL 25 MG PO TABS: 25.0000 mg | ORAL_TABLET | Freq: Once | ORAL | Status: DC

## 2024-05-06 MED ORDER — RISPERIDONE 1 MG PO TABS: 1.0000 mg | ORAL_TABLET | Freq: Two times a day (BID) | ORAL | Status: DC

## 2024-05-06 MED ORDER — RISPERIDONE 1 MG PO TABS
1.0000 mg | ORAL_TABLET | Freq: Once | ORAL | Status: AC
  Administered 2024-05-06: 1 mg via ORAL
  Filled 2024-05-06: qty 1

## 2024-05-06 NOTE — ED Notes (Signed)
 Pt has been cleared by Psych Team and belongings returned.
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
## 2024-05-06 NOTE — ED Notes (Signed)
 Pt c/o chronic BLE pain.  Sts it's the same ol' shit.  Pt reports SI, HI, and AVH.  Pt is vague regarding SI and reports HI towards his brothers.  Pt started getting very agitated when this writer attempted to assess him.  Denies previously seeking help for mental health.
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
## 2024-05-06 NOTE — ED Notes (Addendum)
 Pt verbalized understanding of discharge instructions. Opportunity for questions provided.  Pt provided a bus pass.   Pt offered a sandwich bag and drink to go and refused.  Refused d/c vitals.

## 2024-05-06 NOTE — ED Provider Notes (Signed)
 Behavioral Health Urgent Care Medical Screening Exam  Patient Name: Aaron Rubio MRN: [id number] Date of Evaluation: 05/06/24 Chief Complaint:   I'm suicidal I feel depressed and lost Diagnosis:  Final diagnoses:  Schizophrenia, unspecified type (HCC)  Other psychoactive substance-induced mood disorder (HCC)  Suicidal ideation  Hallucinations  Homelessness    History of Present illness: Aaron Rubio is a 43 y.o. male.  Psychiatric history of schizophrenia, homelessness, MDD, malingering, substance-induced mood disorder, schizoaffective disorder, hallucinations and polysubstance abuse-cannabis, cocaine, methamphetamine, who presented voluntarily as a walk-in to [facility], with complaints of homelessness, SI, HI, hallucinations and medication noncompliance.  Patient was seen face-to-face by this provider and chart reviewed.  Per chart review, patient has a history of multiple ED/urgent care visits due to homelessness and seeking hospitalization for secondary gain of shelter.  Patient has had 4 inpatient admissions within the past 6 months and a total of 50 ED visits. Patient is well-known to the behavioral health service line and was recently admitted at [facility] a month ago for psychiatric care.  He makes almost daily visits to the ED/urgent care centers when not hospitalized inpatient.  Tonight at the [facility], patient is observed responding to internal stimuli.  He is talking obscenities and laughing to himself with rapid and pressured speech.  Patient is also disheveled with a strong odor. On approach, patient has an inappropriate affect, smiling oddly, but remained composed.  Surprisingly his speech is now clear, coherent with normal tone.  He is calm and cooperative and reports I'm suicidal but I've not thought about a plan, I'm working against it, I need to take my medications.  Patient is not established with an outpatient psychiatrist or therapist and remains  noncompliant with his medications when not hospitalized inpatient due to chronic homelessness. He is unsure if he has an ACT team and would benefit from SW referral.  Patient reports he takes Risperdal  but has not taken his nighttime dose.  He reports feeling depressed and lost because of not being able to take showers, brush my teeth, hair, I've been homeless most of my adult life. Patient reports he was for hospitalized 4 weeks ago at [facility].  He reports being diagnosed with ADHD, schizophrenia, paranoia, bipolar and shortness of memory.  Patient endorses homicidal ideations no plan.  He is currently endorsing auditory hallucinations and states, the voices are saying I need to be committed. Patient endorses visual hallucinations of shadows and strange beings.  Patient reports daily marijuana and cocaine use.  He reports using cocaine for a long time now and used within the last 24 hours.  Patient is unable to contract for safety.  On evaluation, patient is alert, oriented x 2, and cooperative. Speech is clear, coherent. Pt appears disheveled. Eye contact is fair. Mood is anxious and depressed, affect is congruent with mood. Thought process is  coherent and thought content is WDL. Pt endorses SI/HI/AVH. There is objective indication that the patient is responding to internal stimuli as he was earlier also talking with rapid and pressured speech, laughing and smiling inappropriately to himself. No delusions elicited during this assessment.    Discussed recommendation for inpatient psychiatric admission for stabilization and treatment.  Discussed inpatient milieu and expectations.  Patient is provided with opportunity for questions. Discussed recommendation for transfer to [facility] for psych hold//Safety monitoring pending transfer to an inpatient unit as there are no available beds at the [facility] tonight. Patient is provided with opportunity for questions.  He verbalized understanding and  is  in agreement.  Flowsheet Row ED from 05/06/2024 in [facility] Most recent reading at 05/06/2024  7:28 PM ED from 05/06/2024 in [facility] Emergency Department at [facility] Most recent reading at 05/06/2024  2:52 AM ED from 05/04/2024 in [facility] Emergency Department at [facility] Most recent reading at 05/04/2024  4:29 PM  C-SSRS RISK CATEGORY High Risk No Risk No Risk    Psychiatric Specialty Exam  Presentation  General Appearance:Disheveled  Eye Contact:Fair  Speech:Clear and Coherent  Speech Volume:Normal  Handedness:Right   Mood and Affect  Mood: Depressed; Anxious  Affect: Blunt; Inappropriate   Thought Process  Thought Processes: Coherent  Descriptions of Associations:Intact  Orientation:Full (Time, Place and Person)  Thought Content:WDL  Diagnosis of Schizophrenia or Schizoaffective disorder in past: Yes  Duration of Psychotic Symptoms: Greater than six months  Hallucinations:Auditory; Visual Patient reports hearing voices saying he needs to be committed Patient reports seeing shadows and strange things  Ideas of Reference:None  Suicidal Thoughts:Yes, Active With Means to Carry Out; Without Plan Without Intent  Homicidal Thoughts:Yes, Active Without Plan   Sensorium  Memory: Immediate Fair  Judgment: Poor  Insight: Shallow   Executive Functions  Concentration: Fair  Attention Span: Fair  Recall: [employer] of Knowledge: Fair  Language: Fair   Psychomotor Activity  Psychomotor Activity: Mannerisms   Assets  Assets: Communication Skills; Desire for Improvement   Sleep  Sleep: Poor  Number of hours:  5   Physical Exam: Physical Exam Constitutional:      General: He is not in acute distress.    Appearance: He is not diaphoretic.  Cardiovascular:     Rate and Rhythm: Normal rate.  Pulmonary:     Effort: No respiratory distress.  Chest:     Chest wall: No  tenderness.  Neurological:     Mental Status: He is alert. Mental status is at baseline.  Psychiatric:        Attention and Perception: He perceives auditory and visual hallucinations.        Mood and Affect: Affect is inappropriate.        Speech: Speech normal.        Behavior: Behavior is actively hallucinating. Behavior is cooperative.        Thought Content: Thought content includes homicidal and suicidal ideation.    Review of Systems  Constitutional:  Negative for chills, diaphoresis and fever.  HENT:  Negative for congestion.   Eyes:  Negative for discharge.  Respiratory:  Negative for cough, shortness of breath and wheezing.   Cardiovascular:  Negative for chest pain and palpitations.  Gastrointestinal:  Negative for diarrhea, nausea and vomiting.  Neurological:  Negative for dizziness, seizures, loss of consciousness, weakness and headaches.  Psychiatric/Behavioral:  Positive for hallucinations, substance abuse and suicidal ideas. The patient is nervous/anxious.    Blood pressure 133/89, pulse 77, temperature 98.6 F (37 C), temperature source Oral, resp. rate 14, SpO2 100%. There is no height or weight on file to calculate BMI.  Musculoskeletal: Strength & Muscle Tone: within normal limits Gait & Station: normal Patient leans: N/A   [facility] Discharge Disposition for Follow up and Recommendations: Based on my recommendation, the patient does not appear to have an emergency medical condition but is recommended for transfer to [facility] for psych hold/safety monitoring pending inpatient psychiatric hospitalization due to lack of available beds at the [facility] tonight.  Recommend inpatient psychiatric admission for stabilization and treatment. TTS  and SW to seek bed placement. -Recommend continuation of Risperdal  1 mg p.o. twice daily for AH/mood stabilization -Recommend continuation of lisinopril 5 mg p.o. daily for HTN -Recommend trazodone  50 mg p.o. nightly as needed  insomnia - Recommend hydroxyzine  25 mg P0 3 times daily for anxiety  Recommend as needed COWS protocol-SUD/working abuse  I spoke with Dr.Kristie Horton at [facility] and the provider has agreed to accept the patient. EMTALA completed  Patient transferred in stable condition.  Aaron LULLA Ivans, NP 05/06/2024, 11:53 PM

## 2024-05-06 NOTE — ED Notes (Signed)
 A regular tray ordered for the patient.

## 2024-05-06 NOTE — ED Notes (Signed)
 Pt called to triage x 1. Pt witnessed leaving area will try to call in a little bit

## 2024-05-06 NOTE — ED Notes (Signed)
Pt noted to be responding to internal stimuli.  

## 2024-05-06 NOTE — ED Notes (Signed)
 ED Provider at bedside.

## 2024-05-06 NOTE — Consult Note (Addendum)
 [facility] Psychiatric Consult Initial  Patient Name: .Aaron Rubio  MRN: [id number]  DOB: [birth year]  Consult Order details:  Orders (From admission, onward)     Start     Ordered   05/06/24 0824  CONSULT TO CALL ACT TEAM       Ordering Provider: Hildegard Loge, PA-C  Provider:  (Not yet assigned)  Question:  Reason for Consult?  Answer:  SI and hallucinations   05/06/24 0824             Mode of Visit: In person    Psychiatry Consult Evaluation  Service Date: May 06, 2024 LOS:  LOS: 0 days  Chief Complaint concern for suicidal ideation and hallucinations  Primary Psychiatric Diagnoses  Hallucinations Suicidal ideations homelessness 2.  Paranoid schizophrenia 3.  Substance induced mood disorder  Assessment  Aaron Rubio is a 43 y.o. male admitted: Presented to the EDfor 05/06/2024  1:57 AM for suicidal ideations and hallucinations. He carries the psychiatric diagnoses of history of cocaine use, paranoid schizophrenia, MDD, substance-induced mood disorder, homelessness and malingering and has a past medical history of hypertension.  Patient presents to the ED reporting dissatisfaction with his current life circumstances, expressing that he feels disadvantaged is a black man and has limited opportunities.  Reports this and being homeless, living on the streets in the cold has contributed to his depression and suicidal thoughts.  He denies any specific plan or intent to harm self. 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  He reports chronic hallucinations commanding him to kill himself but states that he recognizes the voices are not real and denies any intent to act on them.he denies visual hallucinations.  He does not currently appear to be responding to internal/external stimuli.  He also reports vague intermittent homicidal thoughts towards his brothers, which he identifies as chronic and without intent or plan.  He again denied any  access to weapons/firearms and reports no contact with any of his family.  He denies any recent violent behavior.  Discussed UDS findings positive for cocaine.  Patient unwilling to discuss his cocaine use.   Patient declines all forms of psychiatric medications stating he does not believe they are effective.  He also declines outpatient or residential substance abuse treatment, which was offered.  Despite discussion of available community and housing resources, the patient was not receptive and remained focused on obtaining long-term living arrangement from the emergency department.  Of note, he was previously asked to leave the ED yesterday after malingering and sleeping.    Review of the chart reveals multiple ED visits and multiple observation stays and psychiatric hospitalizations.  Patient reports most recently being admitted to [facility], in addition he was admitted to [facility] 04/07/2024. while patient continues to endorse chronic suicidal/homicidal ideations and AH, there is no evidence of acute decompensation, plan, or intent to harm others or self.  He remains future oriented towards securing housing.  Another hospitalization today would not change his chronic risk and there is not evidence of him being an imminent risk of harm to himself or others at this time.  Discussed safety planning: Patient aware to return to ED or call 911 if thoughts of self-harm or harm to others intensify.  Resources given for shelter, housing, substance abuse outpatient and residential.  Provided resources for medication management and therapy.  Also provided resources for food bank and ACTT services.   Diagnoses:  Active Hospital problems: Active Problems:  Hallucinations    Plan   ## Psychiatric Medication Recommendations:  -pt refuses   ## Medical Decision Making Capacity: Not specifically addressed in this encounter  ## Further Work-up:  -- None at this time  -- most recent EKG on 05/02/2024 had  QtC of 462 -- Pertinent labwork reviewed earlier this admission includes: CBC CMP, glucose, COVID PCR negative, BAL<15, UDS positive for cocaine. Chest x-ray completed   ## Disposition:-- There are no psychiatric contraindications to discharge at this time  ## Behavioral / Environmental: - No specific recommendations at this time.     ## Safety and Observation Level:  - Based on my clinical evaluation, I estimate the patient to be at low risk of self harm in the current setting. - At this time, we recommend  routine. This decision is based on my review of the chart including patient's history and current presentation, interview of the patient, mental status examination, and consideration of suicide risk including evaluating suicidal ideation, plan, intent, suicidal or self-harm behaviors, risk factors, and protective factors. This judgment is based on our ability to directly address suicide risk, implement suicide prevention strategies, and develop a safety plan while the patient is in the clinical setting. Please contact our team if there is a concern that risk level has changed.  CSSR Risk Category:C-SSRS RISK CATEGORY: No Risk  Suicide Risk Assessment: Patient has following modifiable risk factors for suicide: medication noncompliance and recent psychiatric hospitalization, which we are addressing by providing resources for community care. Patient has following non-modifiable or demographic risk factors for suicide: male gender and psychiatric hospitalization Patient has the following protective factors against suicide: Access to outpatient mental health care  Thank you for this consult request. Recommendations have been communicated to the primary team.  We will sign off at this time at this time.   Aaron VEAR Batter, NP       History of Present Illness  Relevant Aspects of Hospital ED Course:  Admitted on 05/06/2024 for suicidal ideations and hallucinations. He carries the psychiatric  diagnoses of history of cocaine use, paranoid schizophrenia, MDD, substance-induced mood disorder, homelessness and malingering and has a past medical history of hypertension.  Patient Report:  I need a place I can stay long-term.  Psych ROS:  Depression: Endorses but states it is because of his situation Anxiety: Denies Mania (lifetime and current): Denies Psychosis: (lifetime and current): Endorses  Collateral information:   States he is not in contact with any of his family and denies any collateral to be called  Review of Systems  Constitutional:  Negative for chills and fever.  Respiratory:  Negative for cough.   Cardiovascular:  Negative for chest pain.  Neurological:  Negative for tremors.  Psychiatric/Behavioral:  Positive for depression, hallucinations, substance abuse and suicidal ideas.      Psychiatric and Social History  Psychiatric History:  Information collected from chart review and patient  Prev Dx/Sx: history of cocaine use, paranoid schizophrenia, MDD, substance-induced mood disorder, homelessness and malingering  Current Psych Provider: None-patient refuses to take medications or participate in therapy Home Meds (current): None Previous Med Trials: Prozac , Seroquel , Zyprexa , Trazodone , Hydroxyzine , Gabapentin   Therapy: Denies  Prior Psych Hospitalization: Multiple inpatient psychiatric admissions throughout time in multiple this year most recently at [facility] and [facility] this month per patient. Prior Self Harm: Denies Prior Violence: Denies  Family Psych History: Father has history of heroin use Family Hx suicide: Denies  Social History:  Developmental Hx: Reports history  of ADHD Educational Hx: 12th grade Occupational Hx: Unemployed working on receiving SSI Legal Hx: Denies any current does have a history of incarceration Living Situation: Homeless has 2 children whom he has no contact with Spiritual Hx: Not addressed Access to weapons/lethal  means: Denies  Substance History Alcohol: Denies History of alcohol withdrawal seizures denies History of DT's denies Tobacco: Denies Illicit drugs: UDS positive for cocaine but patient will not elaborate on use Prescription drug abuse: Denies Rehab hx: Denies  Exam Findings  Physical Exam:  Vital Signs:  Temp:  [97.5 F (36.4 C)-98.5 F (36.9 C)] 97.5 F (36.4 C) (11/04 1002) Pulse Rate:  [53-88] 53 (11/04 1002) Resp:  [18-20] 18 (11/04 1002) BP: (140-162)/(95-117) 162/117 (11/04 1002) SpO2:  [95 %-100 %] 100 % (11/04 1002) Weight:  [96.6 kg] 96.6 kg (11/04 0248) Blood pressure (!) 162/117, pulse (!) 53, temperature (!) 97.5 F (36.4 C), temperature source Oral, resp. rate 18, height 6' 2 (1.88 m), weight 96.6 kg, SpO2 100%. Body mass index is 27.34 kg/m.  Physical Exam Constitutional:      Appearance: Normal appearance.  Pulmonary:     Effort: No respiratory distress.  Musculoskeletal:        General: Normal range of motion.     Cervical back: Normal range of motion.  Neurological:     Mental Status: He is alert and oriented to person, place, and time.  Psychiatric:        Attention and Perception: Attention normal.        Mood and Affect: Mood is depressed.        Speech: Speech normal.        Behavior: Behavior is hallucinating. Behavior is cooperative.        Thought Content: Thought content includes homicidal and suicidal ideation. Thought content does not include homicidal or suicidal plan.        Cognition and Memory: Cognition normal.     Mental Status Exam: General Appearance: Casual  Orientation:  Full (Time, Place, and Person)  Memory:  Immediate;   Fair Recent;   Fair Remote;   Fair  Concentration:  Concentration: Good and Attention Span: Good  Recall:  Fair  Attention  Good  Eye Contact:  Fair  Speech:  Clear and Coherent and Normal Rate  Language:  Fair  Volume:  Normal  Mood: irritable with questions   Affect:  Congruent  Thought Process:   Coherent and Descriptions of Associations: Loose -  Thought Content:  AH  Suicidal Thoughts:  Yes.  without intent/plan  Homicidal Thoughts:  Yes.  without intent/plan  Judgement:  Fair  Insight:  Fair  Psychomotor Activity:  Normal  Akathisia:  No  Fund of Knowledge:  Fair      Assets:  Manufacturing Systems Engineer Desire for Improvement Financial Resources/Insurance Physical Health Resilience  Cognition:  WNL  ADL's:  Intact  AIMS (if indicated):        Other History   These have been pulled in through the EMR, reviewed, and updated if appropriate.  Family History:  The patient's family history is not on file.  Medical History: Past Medical History:  Diagnosis Date   Hypertension    Schizophrenia [facility])     Surgical History: History reviewed. No pertinent surgical history.   Medications:  No current facility-administered medications for this encounter.  Current Outpatient Medications:    lisinopril (ZESTRIL) 5 MG tablet, Take 5 mg by mouth daily. (Patient not taking: Reported on 05/02/2024), Disp: , Rfl:  risperiDONE  (RISPERDAL ) 1 MG tablet, Take 1 tablet (1 mg total) by mouth 2 (two) times daily., Disp: 60 tablet, Rfl: 0   zolpidem  (AMBIEN ) 5 MG tablet, Take 1 tablet (5 mg total) by mouth at bedtime as needed for up to 7 doses for sleep., Disp: 7 tablet, Rfl: 0  Allergies: No Known Allergies  Aaron VEAR Batter, NP

## 2024-05-06 NOTE — ED Notes (Signed)
 Psych at bedside.

## 2024-05-06 NOTE — ED Provider Notes (Signed)
 [place] EMERGENCY DEPARTMENT AT [facility] Provider Note   CSN: [id number] Arrival date & time: 05/06/24  9887     Patient presents with: Hallucinations   Aaron Rubio is a 43 y.o. male.   42 year old male with past medical history significant for schizoaffective disorder presents today for concern of suicidal ideation, and hallucinations.  Not significantly compliant with the interview.  States the voices are telling her to harm himself.  No other complaints.  Has been frequently been seen in the emergency department with his last psych eval about a week ago.  The history is provided by the patient. No language interpreter was used.       Prior to Admission medications   Medication Sig Start Date End Date Taking? Authorizing Provider  lisinopril (ZESTRIL) 5 MG tablet Take 5 mg by mouth daily. Patient not taking: Reported on 05/02/2024    [provider]  risperiDONE  (RISPERDAL ) 1 MG tablet Take 1 tablet (1 mg total) by mouth 2 (two) times daily. 05/02/24   Rogelia Jerilynn RAMAN, MD  zolpidem  (AMBIEN ) 5 MG tablet Take 1 tablet (5 mg total) by mouth at bedtime as needed for up to 7 doses for sleep. 05/02/24   Rogelia Jerilynn RAMAN, MD    Allergies: Patient has no known allergies.    Review of Systems  Psychiatric/Behavioral:  Positive for hallucinations and suicidal ideas. Negative for self-injury and sleep disturbance.   All other systems reviewed and are negative.   Updated Vital Signs BP (!) 140/95 (BP Location: Left Arm)   Pulse 88   Temp 98.5 F (36.9 C) (Oral)   Resp 20   Ht 6' 2 (1.88 m)   Wt 96.6 kg   SpO2 95%   BMI 27.34 kg/m   Physical Exam Vitals and nursing note reviewed.  Constitutional:      General: He is not in acute distress.    Appearance: Normal appearance. He is not ill-appearing.  HENT:     Head: Normocephalic and atraumatic.     Nose: Nose normal.  Eyes:     Conjunctiva/sclera: Conjunctivae normal.   Cardiovascular:     Rate and Rhythm: Normal rate and regular rhythm.  Pulmonary:     Effort: Pulmonary effort is normal. No respiratory distress.  Musculoskeletal:        General: No deformity. Normal range of motion.     Cervical back: Normal range of motion.  Skin:    Findings: No rash.  Neurological:     Mental Status: He is alert.  Psychiatric:        Mood and Affect: Affect is flat.        Thought Content: Thought content includes suicidal ideation. Thought content does not include homicidal ideation. Thought content does not include suicidal plan.     (all labs ordered are listed, but only abnormal results are displayed) Labs Reviewed  COMPREHENSIVE METABOLIC PANEL WITH GFR - Abnormal; Notable for the following components:      Result Value   Creatinine, Ser 1.25 (*)    Total Protein 6.3 (*)    All other components within normal limits  CBC - Abnormal; Notable for the following components:   Hemoglobin 12.9 (*)    MCH 25.6 (*)    All other components within normal limits  RAPID URINE DRUG SCREEN, HOSP PERFORMED - Abnormal; Notable for the following components:   Cocaine POSITIVE (*)    All other components within normal limits  ETHANOL  EKG: None  Radiology: No results found.   Procedures   Medications Ordered in the ED  risperiDONE  (RISPERDAL ) tablet 1 mg (has no administration in time range)                                    Medical Decision Making Amount and/or Complexity of Data Reviewed Labs: ordered.  Risk Prescription drug management.   43 year old male presents today for concern of suicidal ideation, and hallucinations.  Has history of schizoaffective disorder. CBC, CMP without acute concern.  Creatinine of 1.25 but normal BUN.  Cocaine positive on UDS.  Ethanol within normal. Medically cleared for psych eval.  Seen by psychiatry.  He is stable for discharge from their standpoint. Patient also stable for discharge from medical  standpoint. Discharged in stable condition.   Final diagnoses:  Hallucinations    ED Discharge Orders     None          Hildegard Loge, PA-C 05/06/24 1050    [name], MD 05/06/24 1527

## 2024-05-06 NOTE — ED Notes (Signed)
 Pt reports he has not had his medications since he was in the hospital.  Sts he doesn't trust people.

## 2024-05-06 NOTE — ED Triage Notes (Signed)
 Pt states that he is hearing voices and they are telling him to kill himself.

## 2024-05-06 NOTE — Discharge Instructions (Addendum)
 Substance Abuse Treatment Programs  Intensive Outpatient Programs [facility]     [address]      [place], [place]                   [phone]       [facility] [address] [place], [place] [phone]  [facility]     (Inpatient and outpatient)     [address]           [phone]    [facility] [phone] (Suboxone and Methadone)  [address]      [place], [place] [address]      [phone]       [address] [place], [place] [phone]  [facility] (Outpatient/Inpatient, Chemical)    (insurance only) [phone]             [facility] (Groups & Residential) [place], [place] [phone]     [facility]     [address]     [place], [place]      [phone]       [facility] (for caregivers and family) [phone] [place], [place] [phone]      Residential Treatment Programs [facility]      [address] [place], [place] [address]  [phone]       [facility] [address], [place], [place] [address] [phone]  [facility] [place]        [phone]       [facility] [phone]  [facility] ([facility]             [address]                                         [place], [place]                                                [phone] or [phone]                               [facility] [place] [address] [place], [address] [phone]  [facility]    [address]      [place], [place]     [phone]       [facility] [address] [place], [place] [phone]  [facility]   [address] [place], [place] [address]     [phone]      Admissions: 8am-3pm M-F  [facility] ([facility]) [address] [place],  [place] [phone]  [facility]: Residential Program [phone] Days)   [place], [place]      [phone] or [phone]     [facility]: [facility] [place], [place] (Walk in Hours over the weekend or by referral)  [facility] [address] [place], [place], [place] [address] [phone]  Crisis Mobile: [facility]:  [phone] (for crisis response 24 hours a day) [facility] (Mid[facility]) Hotline:      [phone] Outpatient Psychiatry and Counseling  [facility]: Mobile Crisis  Management 24 hours:  [phone]  [facility] [employer] sliding scale fee and walk in schedule: M-F 8am-12pm/1pm-3pm [address]  [place], [place] [address] [phone]  [facility] [address] [place], [place] [address] [phone]  [facility] ([facility] [employer])- new patient walk-in appointments available Monday - Friday 8am -3pm.          [address] [place], [place] [address] [phone] or crisis line- [phone]  [facility] Outpatient Services/ Intensive Outpatient Therapy Program [address] [place], [place] [address] [phone]  [facility]                  Crisis Services      [phone] [address]     [place], [place] [address]                 [facility]   [facility] [phone]. [address] [place], [place] [address]   [facility]          [address] [place]  [place], [place] [address]       [phone]  [facility] [address] [place], [place] [address] [phone]  [facility]    [address]    [place], [place] [address]     [phone]       Ima Anon, MD     [address]     [place] [address]     [phone]       [facility] [address] [place] [address] [place] [address]  [facility]     [address]     [place], [place]      [phone]       [facility] Rocky [place], MD [address] [place], [place] [address] [phone]  [facility]     [address]     [place], [place] [address]     [phone]       [facility] [address] [place], [place] [address] [phone] Resources for Temporary Residential Assistance/Crisis Centers  DAY CENTERS [facility]) M-F 8am-3pm   [address]  [place], [place] [address]   [phone] Services include: laundry, barbering, support groups, case management, phone  & computer access, showers, AA/NA mtgs, mental health/substance abuse nurse, job skills class, disability information, VA assistance, spiritual classes, etc.   HOMELESS SHELTERS  [facility]     [facility]   [address], [place]     [phone]              [employer] (women and children)       [address] [place], [place] [address] [phone] [email or web address] for application and process Application Required  [facility]   [address]    [place] [address]     [phone]                    [facility] [place] [address] [place], [place] [address] [phone] application appt.) Application Required  [facility] (women only)    [address]     [place], [place] [address]     [phone]  Intake starts 6pm daily Need valid ID, SSC, & Police report [employer] [address] [place], [place] [phone] Application Required  [employer] (men only)     [address]      [place], [place]     [phone]       [facility] [place] (Pregnant women only) [address] [place], [place] [phone]  [facility]      [address]      [place], [place] [address]     [phone]             [facility] [address] [place], [place] [phone] 90 day commitment/SA/Application process  [facility](men only)     [address]     [place], [place]     [phone]       Check-in at [facility] [address] [place], [place] [address] [phone] Men/Women/Women and Children must be there by 7 pm  [facility] [place], [place] [phone]                   [facility] [address] [place], [place], [address] [phone] phone  Offers food and emergency or transitional housing to men, women, or families in need. Clients participate in programs and workshops developed to promote self-sufficiency and personal development.Call or walk in. Applications are accepted Monday, Wednesday, and Friday by appointment only. Need photo ID and proof of income.  [facility] - [facility] [address] [place], [place] [address] [phone] Population served: Adult men & women (10 years old and older, able to perform activities for daily living) Documents required: Valid ID & Social Security Card  [facility] [place] Evetta Hick [address] [place], [place]  [address] [phone] Population served: Male veterans 18+ with substance abuse/mental health issues Eligibility: By referral only  [facility] [address] [place], [place] [address] [phone] Population served: Males 18+ Documents required: Valid ID & Social Security Card   [facility] [place] [address], [place], [place] [address] [phone] Population served: Men 18+, preference for disabled and/or veterans Eligibility: By referral only

## 2024-05-07 ENCOUNTER — Other Ambulatory Visit: Payer: Self-pay

## 2024-05-07 ENCOUNTER — Emergency Department (HOSPITAL_COMMUNITY)
Admission: EM | Admit: 2024-05-07 | Discharge: 2024-05-07 | Disposition: A | Discharge status: Home / Self Care | Attending: Emergency Medicine | Admitting: Emergency Medicine

## 2024-05-07 ENCOUNTER — Encounter (HOSPITAL_COMMUNITY): Payer: Self-pay | Admitting: *Deleted

## 2024-05-07 DIAGNOSIS — I10 Essential (primary) hypertension: Secondary | ICD-10-CM | POA: Diagnosis not present

## 2024-05-07 DIAGNOSIS — F1420 Cocaine dependence, uncomplicated: Secondary | ICD-10-CM | POA: Diagnosis not present

## 2024-05-07 DIAGNOSIS — F1220 Cannabis dependence, uncomplicated: Secondary | ICD-10-CM | POA: Diagnosis present

## 2024-05-07 DIAGNOSIS — Z5900 Homelessness unspecified: Secondary | ICD-10-CM

## 2024-05-07 DIAGNOSIS — F1994 Other psychoactive substance use, unspecified with psychoactive substance-induced mood disorder: Secondary | ICD-10-CM

## 2024-05-07 DIAGNOSIS — F320 Major depressive disorder, single episode, mild: Secondary | ICD-10-CM

## 2024-05-07 DIAGNOSIS — F259 Schizoaffective disorder, unspecified: Secondary | ICD-10-CM

## 2024-05-07 DIAGNOSIS — F250 Schizoaffective disorder, bipolar type: Secondary | ICD-10-CM | POA: Diagnosis present

## 2024-05-07 DIAGNOSIS — R45851 Suicidal ideations: Secondary | ICD-10-CM | POA: Diagnosis not present

## 2024-05-07 MED ORDER — ONDANSETRON 4 MG PO TBDP: 4.0000 mg | ORAL_TABLET | Freq: Four times a day (QID) | ORAL | Status: DC | PRN

## 2024-05-07 MED ORDER — METHOCARBAMOL 500 MG PO TABS: 500.0000 mg | ORAL_TABLET | Freq: Three times a day (TID) | ORAL | Status: DC | PRN

## 2024-05-07 MED ORDER — LISINOPRIL 10 MG PO TABS
5.0000 mg | ORAL_TABLET | Freq: Once | ORAL | Status: AC
  Administered 2024-05-07: 5 mg via ORAL
  Filled 2024-05-07: qty 1

## 2024-05-07 MED ORDER — LOPERAMIDE HCL 2 MG PO CAPS: 2.0000 mg | ORAL_CAPSULE | ORAL | Status: DC | PRN

## 2024-05-07 MED ORDER — TRAZODONE HCL 50 MG PO TABS: 50.0000 mg | ORAL_TABLET | Freq: Every day | ORAL | Status: DC

## 2024-05-07 MED ORDER — RISPERIDONE 1 MG PO TABS
1.0000 mg | ORAL_TABLET | Freq: Two times a day (BID) | ORAL | Status: DC
  Administered 2024-05-07 (×2): 1 mg via ORAL
  Filled 2024-05-07: qty 1
  Filled 2024-05-07: qty 2

## 2024-05-07 MED ORDER — DICYCLOMINE HCL 20 MG PO TABS: 20.0000 mg | ORAL_TABLET | Freq: Four times a day (QID) | ORAL | Status: DC | PRN

## 2024-05-07 MED ORDER — NAPROXEN 500 MG PO TABS: 500.0000 mg | ORAL_TABLET | Freq: Two times a day (BID) | ORAL | Status: DC | PRN

## 2024-05-07 MED ORDER — HYDROXYZINE HCL 25 MG PO TABS
25.0000 mg | ORAL_TABLET | Freq: Three times a day (TID) | ORAL | Status: DC | PRN
Start: 2024-05-07 — End: 2024-05-07

## 2024-05-07 NOTE — ED Triage Notes (Signed)
 Sent from [facility] c/o suicidal with a plan. Pt does not elaborate on plan at this time. C/o leg pain and feeling tired. Belongings placed in cabinet for 23-25

## 2024-05-07 NOTE — Discharge Instructions (Signed)
Follow-up as instructed by behavioral health 

## 2024-05-07 NOTE — ED Notes (Signed)
 Staff secured 2 bags of belongings in locker #35

## 2024-05-07 NOTE — ED Notes (Signed)
Pt changed into burgundy scrubs °

## 2024-05-07 NOTE — Consult Note (Signed)
 [facility] Psychiatric Consult Initial  Patient Name: .Aaron Rubio  MRN: [id number]  DOB: 1980-11-19  Consult Order details:  Orders (From admission, onward)     Start     Ordered   05/06/24 0824  CONSULT TO CALL ACT TEAM       Ordering Provider: Hildegard Loge, PA-C  Provider:  (Not yet assigned)  Question:  Reason for Consult?  Answer:  SI and hallucinations   05/06/24 0824             Mode of Visit: Tele-visit Virtual Statement:TELE PSYCHIATRY ATTESTATION & CONSENT As the provider for this telehealth consult, I attest that I verified the patient's identity using two separate identifiers, introduced myself to the patient, provided my credentials, disclosed my location, and performed this encounter via a HIPAA-compliant, real-time, face-to-face, two-way, interactive audio and video platform and with the full consent and agreement of the patient (or guardian as applicable.) Patient physical location: [facility]. Telehealth provider physical location: home office in state of [place].   Video start time: 0955 Video end time: 1040    Psychiatry Consult Evaluation  Service Date: May 07, 2024 LOS:  LOS: 0 days  Chief Complaint things are moving along.   Primary Psychiatric Diagnoses   Substance Induced Mood disorder 2.  Passive Suicidal Ideation 3. Cocaine use disorder, severe, dependence 4. Schizoaffective Disorder 5. Homelessness Assessment  Aaron Rubio is a 43 y.o. male admitted: Presented to the EDfor 05/07/2024  1:22 AM for   Per ED Provider Admission Assessment 05/07/2024@0120 : Patient presents with: Suicidal   Aaron Rubio is a 43 y.o. male. The history is provided by the patient and medical records.  Aaron Rubio is a 43 y.o. male who presents to the Emergency Department complaining of psychiatric evaluation.  Patient was transferred to the emergency department from behavioral health urgent care pending psychiatric  evaluation.  He has a history of substance-induced mood disorder and presented for suicidal ideation.  He was evaluated at behavioral health urgent care with plan to monitor for safety pending inpatient psychiatric hospitalization.  Currently there were no beds available at that facility, which prompted ED transfer.  Patient does report ongoing SI.  He also reports bilateral feet pain chronically.  Psychiatric Assessment: He carries the psychiatric diagnoses of history of cocaine use, paranoid schizophrenia,substance-induced mood disorder, Major Depressive Disorder, homelessness and malingering and has a past medical history of hypertension. He was medically cleared prior to psych assessment.   Patient was recently evaluated at [facility] on 05/06/2024, declined all resources, psychiatric medications, outpatient and residential substance abuse treatment.  At that time, patient had declined all housing resources and was focused on obtaining long-term living arrangements from the emergency department. Patient does have a hx of malingering behaviors and was asked to leave the emergency department after malingering and sleeping.    Per chart review patient has had multiple emergency room visits and subsequent observation stays and psychiatric hospitalizations.  Patient is usually admitted for stabilization, restarted on medications and d/c with outpatient follow plans.  However, patient usually stops taking prescribed psychotropic medications and begins self-medicating with poly substances.  Per patient, he usually returns to the emergency department when has has no place to sleep, which provokes suicidal ideations which are chronic, and hallucinations exacerbated by street drug abuse and medication non adherence.  On assessments today, patient is alert and oriented to current situation; he's appropriately engaged in the visits, and there's no evidence of acute  mental decline.  Since admission, he was restarted on  his psychotropic medications, no longer hallucinating and no apparent distress observed during assessment.  He endorses suicidal ideations, that have improved, now passive with no clear plan or intent for self-harm.  Patient contract for safety and verbalizes the ability to return to the emergency department should thing change. He now accepts peer support for substance abuse and resources for housing, shelters.   Diagnoses:  Active Hospital problems: Principal Problem:   Substance induced mood disorder (HCC) Active Problems:   Passive suicidal ideations   Homelessness   Cannabis use disorder, severe, dependence (HCC)   Schizoaffective disorder (HCC)    Plan   ## Psychiatric Medication Recommendations:  -Discussed changing from risperidone  to Abilify d/t prolonged QT interval but patient refused.  He also refused starting other medications for mood stability.  ## Medical Decision Making Capacity: Not specifically addressed in this encounter  ## Further Work-up:  -- Deferred to ED provider -- most recent EKG on 05/02/2024 had QtC of 366/462, prolonged QT intervals -- Pertinent labwork reviewed earlier this admission includes: CBC CMP, Glucose, COVID PCR negative, BAL<15, UDS positive for cocaine   ## Disposition:-- There are no psychiatric contraindications to discharge at this time TOC entered to discuss getting set up with [facility] Peer support ordered  ## Behavioral / Environmental: - No specific recommendations at this time.     ## Safety and Observation Level:  - Based on my clinical evaluation, I estimate the patient to be at Low risk of self harm in the current setting. - At this time, we recommend  routine. This decision is based on my review of the chart including patient's history and current presentation, interview of the patient, mental status examination, and consideration of suicide risk including evaluating suicidal ideation, plan, intent, suicidal or self-harm behaviors,  risk factors, and protective factors. This judgment is based on our ability to directly address suicide risk, implement suicide prevention strategies, and develop a safety plan while the patient is in the clinical setting. Please contact our team if there is a concern that risk level has changed.  CSSR Risk Category:C-SSRS RISK CATEGORY: Moderate Risk  Suicide Risk Assessment: Patient has following modifiable risk factors for suicide: hx of chronic medication noncompliance and recent psychiatric hospitalization, which we are addressing by providing resources for community care. Patient has following non-modifiable or demographic risk factors for suicide: male gender and psychiatric hospitalization Patient has the following protective factors against suicide: Access to outpatient mental health care  Thank you for this consult request. Recommendations have been communicated to the primary team.  We will sign off at this time at this time.   Bernadette FORBES Barefoot, NP       History of Present Illness  Relevant Aspects of Hospital ED Course:  Admitted on 05/07/2024 for suicidal ideations and hallucinations. He carries the psychiatric diagnoses of history of cocaine use, paranoid schizophrenia, MDD, substance-induced mood disorder, homelessness and malingering and has a past medical history of hypertension.  Patient Report:  Patient observed asleep in bed but wakes up when prompted by nurse technician.  He's appropriately groomed and wearing burgundy hospital scrubs, he appears tired and observed wiping his eyes but no apparent distress.  Patient is greeted by this clinical research associate and given anticipatory guidance, he agrees to continued.  He reports, I'm doing fine since being admitted and restarting his psychotropic medications.  He reports he slept good last night, but has not had breakfast today.  He  is alert and oriented, makes good eye contact.  His speech is fluent, not pressured or rushed.  His previously  reports AVH have improved and patient states, feels like I'm moving right along.  He endorses passive suicidal ideations, but no clear plan or intent for self harm. Of note, patient has chronic suicidal ideations but does not consistently take is prescribed risperidone  or trazodone  but self medicates with cocaine and other polysubstance's.  This was discussed with him today, and he agrees to talk with peer support for resources.  His previously reports homicidal ideation has improved.   Patient tells me his goal to obtain a place to stat and I guess get help with other stuff.  He reports he previously reached out to [facility] for [facility] treatment but did not follow up.  Today he tells me he's interested in follow up.  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    Patient does not appear to be responding to internal stimulus; his thoughts are not delusional, paranoid or of psychosis.  He's observed asking nursing services breakfast and a sprite; additionally heard briefly discussing his care-demonstrates ability to comprehend and restate tx plan, and able to verbally make is needs known.   Pt denies GI concerns, or drug withdrawal sx.  However, does demonstrate some annoyance and intermittent irritability which could be related to cocaine usage.   Psych ROS:  Depression: Endorse feeling depressed d/t chronic homelessness Anxiety: Denies Mania (lifetime and current): Denies Psychosis: (lifetime and current): has improved since restarting risperidone .  Collateral information:  Deferred for now as patient denies contact with family and states he has no one to call for collateral.  Review of Systems  Constitutional: Negative.   HENT: Negative.    Eyes: Negative.   Respiratory: Negative.    Cardiovascular: Negative.   Gastrointestinal: Negative.   Genitourinary: Negative.   Musculoskeletal: Negative.   Skin:  Negative.   Neurological:  Negative for dizziness, tremors, weakness and headaches.  Endo/Heme/Allergies: Negative.   Psychiatric/Behavioral:  Positive for depression, substance abuse and suicidal ideas (has improved to passive with no plan or intent verbalized today since restarting psychotropic medications). Negative for hallucinations (has improved since restarting psychotropic medication).      Psychiatric and Social History  Psychiatric History:  Information collected from chart review and patient  Prev Dx/Sx: history of paranoid schizophrenia, MDD, cocaine use, substance-induced mood disorder, homelessness and malingering  Current Psych Provider: None-patient refuses to take medications or participate in therapy Home Meds (current): None Previous Med Trials: Prozac , Seroquel , Zyprexa , Trazodone , Hydroxyzine , Gabapentin   Therapy: Denies  Prior Psych Hospitalization: Multiple inpatient psychiatric admissions throughout time in multiple this year most recently at [facility] this month per patient. Prior Self Harm: Denies Prior Violence: Denies  Family Psych History: Father has history of heroin use Family Hx suicide: Denies  Social History:  Developmental Hx: Reports history of ADHD Educational Hx: High School Diploma Occupational Hx: He is not working but reports receiving SSI Legal Hx: Hx hx of being locked up but he denies current legal charges.  Living Situation: Homeless has 2 children whom he reports being estranged from but does not ellaborate Spiritual Hx: Not addressed Access to weapons/lethal means: patient denies   Substance History Alcohol: Patient Denies History of alcohol withdrawal seizures denies History of DT's denies Tobacco: he denies Illicit drugs: UDS positive for cocaine use on  11/4, 10/22, 10/19, 10/5, positive results since May 2025 but patient is guarded and does not elaborate regarding specifics of usage.  Prescription drug abuse:  Denies Rehab hx: he is unsure if he was admitted for rehab but has been to [facility] for substance tx  Exam Findings  Physical Exam:  Vital Signs:  Temp:  [97.9 F (36.6 C)-98.6 F (37 C)] 97.9 F (36.6 C) (11/05 0132) Pulse Rate:  [75-96] 75 (11/05 0132) Resp:  [14-16] 16 (11/05 0132) BP: (133-152)/(89-106) 152/106 (11/05 0132) SpO2:  [100 %] 100 % (11/05 0132) Blood pressure (!) 152/106, pulse 75, temperature 97.9 F (36.6 C), resp. rate 16, SpO2 100%. There is no height or weight on file to calculate BMI.  Physical Exam Constitutional:      Appearance: Normal appearance.  Pulmonary:     Effort: No respiratory distress.  Musculoskeletal:        General: Normal range of motion.     Cervical back: Normal range of motion.  Neurological:     Mental Status: He is alert and oriented to person, place, and time.  Psychiatric:        Attention and Perception: Attention normal.        Mood and Affect: Mood is depressed.        Speech: Speech normal.        Behavior: Behavior is hallucinating. Behavior is cooperative.        Thought Content: Thought content includes homicidal and suicidal ideation. Thought content does not include homicidal or suicidal plan.        Cognition and Memory: Cognition normal.     Mental Status Exam: General Appearance: Casual  Orientation:  Full (Time, Place, and Person)  Memory:  Immediate;   Fair Recent;   Fair Remote;   Fair  Concentration:  Concentration: Good and Attention Span: Good  Recall:  Fair  Attention  Good  Eye Contact:  Good  Speech:  Clear and Coherent and Normal Rate  Language:  Good  Volume:  Normal  Mood: he appears annoyed (rolling his eyes at this clinical research associate) but is actively engaged in assessment  Affect:  Congruent  Thought Process:  Coherent and Descriptions of Associations: Loose -  Thought Content:  goal oriented  Suicidal Thoughts:  Yes.  without intent/plan  Homicidal Thoughts:  No  Judgement:  Fair in the setting of chronic  cocaine and polysubstance usage and medication non-adherence  Insight:  Fair, baseline  Psychomotor Activity:  Normal  Akathisia:  No  Fund of Knowledge:  Fair      Assets:  Manufacturing Systems Engineer Desire for Improvement Financial Resources/Insurance Physical Health Resilience  Cognition:  WNL  ADL's:  Intact  AIMS (if indicated):        Other History   EMR, reviewed, and updated if appropriate.  Family History:  The patient's family history is not on file.  Medical History: Past Medical History:  Diagnosis Date   Hypertension    Schizophrenia [facility])     Surgical History: History reviewed. No pertinent surgical history.   Medications:   Current Facility-Administered Medications:    dicyclomine  (BENTYL ) tablet 20 mg, 20 mg, Oral, Q6H PRN, Griselda Norris, MD   hydrOXYzine  (ATARAX ) tablet 25 mg, 25 mg, Oral, TID PRN, Griselda Norris, MD   loperamide  (IMODIUM ) capsule 2-4 mg, 2-4 mg, Oral, PRN, Griselda Norris, MD   methocarbamol  (ROBAXIN ) tablet 500 mg, 500 mg, Oral, Q8H PRN, Griselda Norris, MD   naproxen  (NAPROSYN ) tablet 500 mg, 500 mg,  Oral, BID PRN, Griselda Norris, MD   ondansetron  (ZOFRAN -ODT) disintegrating tablet 4 mg, 4 mg, Oral, Q6H PRN, Griselda Norris, MD   risperiDONE  (RISPERDAL ) tablet 1 mg, 1 mg, Oral, BID, Griselda Norris, MD, 1 mg at 05/07/24 1114   traZODone  (DESYREL ) tablet 50 mg, 50 mg, Oral, QHS, Griselda Norris, MD  Current Outpatient Medications:    lisinopril (ZESTRIL) 5 MG tablet, Take 5 mg by mouth daily. (Patient not taking: Reported on 05/02/2024), Disp: , Rfl:    risperiDONE  (RISPERDAL ) 1 MG tablet, Take 1 tablet (1 mg total) by mouth 2 (two) times daily., Disp: 60 tablet, Rfl: 0  Allergies: No Known Allergies  Bernadette FORBES Barefoot, NP

## 2024-05-07 NOTE — Progress Notes (Signed)
 ICM consulted to address pt's needs such as homelessness, polysubstance abuse, and mental health needs. Per chart review, [facility] has outreached to Peer Support Specialist to provide support to pt in the community. CSW to attach shelter and substances resources. No further ICM needs. ICM signing off.

## 2024-05-07 NOTE — ED Provider Notes (Signed)
 [place] EMERGENCY DEPARTMENT AT [facility] Provider Note   CSN: [id number] Arrival date & time: 05/07/24  0120     Patient presents with: Suicidal   Aaron Rubio is a 43 y.o. male.   The history is provided by the patient and medical records.  Aaron Rubio is a 43 y.o. male who presents to the Emergency Department complaining of psychiatric evaluation.  Patient was transferred to the emergency department from behavioral health urgent care pending psychiatric evaluation.  He has a history of substance-induced mood disorder and presented for suicidal ideation.  He was evaluated at behavioral health urgent care with plan to monitor for safety pending inpatient psychiatric hospitalization.  Currently there were no beds available at that facility, which prompted ED transfer.  Patient does report ongoing SI.  He also reports bilateral feet pain chronically.     Prior to Admission medications   Medication Sig Start Date End Date Taking? Authorizing Provider  lisinopril (ZESTRIL) 5 MG tablet Take 5 mg by mouth daily. Patient not taking: Reported on 05/02/2024    [provider]  risperiDONE  (RISPERDAL ) 1 MG tablet Take 1 tablet (1 mg total) by mouth 2 (two) times daily. 05/02/24   Rogelia Jerilynn RAMAN, MD    Allergies: Patient has no known allergies.    Review of Systems  All other systems reviewed and are negative.   Updated Vital Signs BP (!) 152/106   Pulse 75   Temp 97.9 F (36.6 C)   Resp 16   SpO2 100%   Physical Exam Vitals and nursing note reviewed.  Constitutional:      Appearance: He is well-developed.  HENT:     Head: Normocephalic and atraumatic.  Cardiovascular:     Rate and Rhythm: Normal rate and regular rhythm.  Pulmonary:     Effort: Pulmonary effort is normal. No respiratory distress.  Musculoskeletal:        General: No tenderness.     Comments: 2+ DP pulses bilaterally.  There is mild redness to bilateral feet without  any focal evidence of infection  Skin:    General: Skin is warm and dry.  Neurological:     Mental Status: He is alert and oriented to person, place, and time.  Psychiatric:     Comments: Disorganized, quiet speech, reports SI     (all labs ordered are listed, but only abnormal results are displayed) Labs Reviewed - No data to display  EKG: None  Radiology: No results found.   Procedures   Medications Ordered in the ED  risperiDONE  (RISPERDAL ) tablet 1 mg (1 mg Oral Given 05/07/24 0256)  traZODone  (DESYREL ) tablet 50 mg (has no administration in time range)  hydrOXYzine  (ATARAX ) tablet 25 mg (has no administration in time range)  dicyclomine  (BENTYL ) tablet 20 mg (has no administration in time range)  loperamide  (IMODIUM ) capsule 2-4 mg (has no administration in time range)  methocarbamol  (ROBAXIN ) tablet 500 mg (has no administration in time range)  naproxen  (NAPROSYN ) tablet 500 mg (has no administration in time range)  ondansetron  (ZOFRAN -ODT) disintegrating tablet 4 mg (has no administration in time range)  lisinopril (ZESTRIL) tablet 5 mg (5 mg Oral Given 05/07/24 0256)                                    Medical Decision Making Risk Prescription drug management.   Patient with history of substance use disorder,  hypertension, schizoaffective disorder and here for evaluation of suicidal ideation, referred from behavioral health urgent care due to lack of capacity at their facility.  Patient is without acute complaints, has baseline SI.  Patient care transferred pending availability of psychiatric bed.  Patient is voluntary at this time.     Final diagnoses:  None    ED Discharge Orders     None          Griselda Norris, MD 05/07/24 [phone]

## 2024-05-07 NOTE — ED Notes (Addendum)
 [facility] spoke with pt about connecting him to the Peer Support Specialist, Aaron Rubio in the [employer] of the city of [place] for assistance with substance abuse treatment, being without a residence, and other needs he may have. Pt agreed to speak with the PSS. [facility] called Aaron Rubio and left a HIPAA compliant message to return the call.  [facility] received a call back from the PSS. She is at [facility] currently and will come over to the [facility] ED when she finishes there. [facility] agreed to contact her in the event pt ends up discharging before she arrives.   Aaron Rubio, Peer Support Specialist met with pt. PSS and pt created a work plan that includes establishing a community team, securing housing, and addressing other outstanding needs. Per PSS, pt has SNAP benefits, Medicaid, and identification. Pt is in need of a social security card which the PSS will assist with. Pt provided the PSS with a working cell phone number to stay in contact and begin working on his plan.  Aaron Rubio, [facility]  05/07/24

## 2024-06-02 ENCOUNTER — Encounter (HOSPITAL_COMMUNITY): Payer: Self-pay

## 2024-06-02 ENCOUNTER — Emergency Department (HOSPITAL_COMMUNITY)
Admission: EM | Admit: 2024-06-02 | Discharge: 2024-06-03 | Disposition: A | Attending: Emergency Medicine | Admitting: Emergency Medicine

## 2024-06-02 ENCOUNTER — Other Ambulatory Visit: Payer: Self-pay

## 2024-06-02 DIAGNOSIS — I10 Essential (primary) hypertension: Secondary | ICD-10-CM | POA: Insufficient documentation

## 2024-06-02 DIAGNOSIS — Z79899 Other long term (current) drug therapy: Secondary | ICD-10-CM | POA: Insufficient documentation

## 2024-06-02 DIAGNOSIS — R197 Diarrhea, unspecified: Secondary | ICD-10-CM | POA: Insufficient documentation

## 2024-06-02 DIAGNOSIS — R109 Unspecified abdominal pain: Secondary | ICD-10-CM | POA: Insufficient documentation

## 2024-06-02 LAB — CBC
HCT: 43.3 % (ref 39.0–52.0)
Hemoglobin: 13.5 g/dL (ref 13.0–17.0)
MCH: 25.9 pg — ABNORMAL LOW (ref 26.0–34.0)
MCHC: 31.2 g/dL (ref 30.0–36.0)
MCV: 83 fL (ref 80.0–100.0)
Platelets: 220 K/uL (ref 150–400)
RBC: 5.22 MIL/uL (ref 4.22–5.81)
RDW: 13.8 % (ref 11.5–15.5)
WBC: 8.1 K/uL (ref 4.0–10.5)
nRBC: 0 % (ref 0.0–0.2)

## 2024-06-02 LAB — COMPREHENSIVE METABOLIC PANEL WITH GFR
ALT: 18 U/L (ref 0–44)
AST: 26 U/L (ref 15–41)
Albumin: 3.7 g/dL (ref 3.5–5.0)
Alkaline Phosphatase: 59 U/L (ref 38–126)
Anion gap: 7 (ref 5–15)
BUN: 14 mg/dL (ref 6–20)
CO2: 27 mmol/L (ref 22–32)
Calcium: 8.7 mg/dL — ABNORMAL LOW (ref 8.9–10.3)
Chloride: 107 mmol/L (ref 98–111)
Creatinine, Ser: 1.16 mg/dL (ref 0.61–1.24)
GFR, Estimated: >60 mL/min (ref >60)
Glucose, Bld: 82 mg/dL (ref 70–99)
Potassium: 3.6 mmol/L (ref 3.5–5.1)
Sodium: 141 mmol/L (ref 135–145)
Total Bilirubin: 0.7 mg/dL (ref 0.0–1.2)
Total Protein: 6.3 g/dL — ABNORMAL LOW (ref 6.5–8.1)

## 2024-06-02 LAB — RAPID URINE DRUG SCREEN, HOSP PERFORMED
Amphetamines: NOT DETECTED
Barbiturates: NOT DETECTED
Benzodiazepines: NOT DETECTED
Cocaine: NOT DETECTED
Opiates: NOT DETECTED
Tetrahydrocannabinol: NOT DETECTED

## 2024-06-02 LAB — ETHANOL: Alcohol, Ethyl (B): <15 mg/dL (ref <15)

## 2024-06-02 NOTE — ED Triage Notes (Signed)
 Patient states he's having stomach pain and diarrhea and dizziness. Denies nausea and vomiting,

## 2024-06-02 NOTE — ED Notes (Signed)
 Pt belongings in locker #5 in purple.

## 2024-06-02 NOTE — ED Triage Notes (Signed)
 Pt originally stated that he had stomach cramps, when this RN told the pt that he would need to go to the lobby he then said he was suicidal and could not go back to the lobby

## 2024-06-03 ENCOUNTER — Encounter (HOSPITAL_COMMUNITY): Payer: Self-pay

## 2024-06-03 ENCOUNTER — Other Ambulatory Visit: Payer: Self-pay

## 2024-06-03 ENCOUNTER — Emergency Department (HOSPITAL_COMMUNITY)
Admission: EM | Admit: 2024-06-03 | Discharge: 2024-06-03 | Discharge status: Against Medical Advice | Attending: Emergency Medicine | Admitting: Emergency Medicine

## 2024-06-03 ENCOUNTER — Emergency Department (HOSPITAL_COMMUNITY)
Admission: EM | Admit: 2024-06-03 | Discharge: 2024-06-04 | Disposition: A | Attending: Emergency Medicine | Admitting: Emergency Medicine

## 2024-06-03 DIAGNOSIS — I10 Essential (primary) hypertension: Secondary | ICD-10-CM | POA: Insufficient documentation

## 2024-06-03 DIAGNOSIS — T699XXA Effect of reduced temperature, unspecified, initial encounter: Secondary | ICD-10-CM | POA: Insufficient documentation

## 2024-06-03 DIAGNOSIS — X31XXXA Exposure to excessive natural cold, initial encounter: Secondary | ICD-10-CM | POA: Insufficient documentation

## 2024-06-03 DIAGNOSIS — Z5900 Homelessness unspecified: Secondary | ICD-10-CM | POA: Insufficient documentation

## 2024-06-03 DIAGNOSIS — Z5329 Procedure and treatment not carried out because of patient's decision for other reasons: Secondary | ICD-10-CM | POA: Insufficient documentation

## 2024-06-03 NOTE — ED Provider Notes (Signed)
 [place] EMERGENCY DEPARTMENT AT [facility] Provider Note   CSN: [id number] Arrival date & time: 06/03/24  9691     Patient presents with: Cold Exposure   Aaron Rubio is a 43 y.o. male with a history of schizophrenia and hypertension, who has had recurrent emergency department visits in the last 24 hours. He presents to the ED with cold exposure that began this morning after being discharged at 0200. Patient currently has no physical complaints, in no acute distress, and is sitting on the hospital bed eating a meal.   HPI     Prior to Admission medications   Medication Sig Start Date End Date Taking? Authorizing Provider  lisinopril  (ZESTRIL ) 5 MG tablet Take 5 mg by mouth daily. Patient not taking: Reported on 05/02/2024    [provider]  risperiDONE  (RISPERDAL ) 1 MG tablet Take 1 tablet (1 mg total) by mouth 2 (two) times daily. 05/02/24   Rogelia Jerilynn RAMAN, MD    Allergies: Patient has no known allergies.    Review of Systems  Allergic/Immunologic:       Cold exposure     Updated Vital Signs BP (!) 149/98 (BP Location: Right Arm)   Pulse 66   Temp (!) 97.5 F (36.4 C)   Resp 20   Ht 6' 2 (1.88 m)   Wt 96.6 kg   SpO2 100%   BMI 27.34 kg/m   Physical Exam Vitals and nursing note reviewed.  Constitutional:      General: He is not in acute distress.    Appearance: Normal appearance.  HENT:     Head: Normocephalic and atraumatic.  Eyes:     Extraocular Movements: Extraocular movements intact.     Conjunctiva/sclera: Conjunctivae normal.     Pupils: Pupils are equal, round, and reactive to light.  Cardiovascular:     Rate and Rhythm: Normal rate and regular rhythm.     Pulses: Normal pulses.  Pulmonary:     Effort: Pulmonary effort is normal. No respiratory distress.  Musculoskeletal:        General: Normal range of motion.     Cervical back: Normal range of motion.  Skin:    General: Skin is warm and dry.     Capillary  Refill: Capillary refill takes less than 2 seconds.  Neurological:     General: No focal deficit present.     Mental Status: He is alert. Mental status is at baseline.  Psychiatric:        Mood and Affect: Mood normal.     (all labs ordered are listed, but only abnormal results are displayed) Labs Reviewed - No data to display  EKG: None  Radiology: No results found.   Procedures   Medications Ordered in the ED - No data to display                                Medical Decision Making  Patient presents to the ED for: cold exposure This involves an extensive number of treatment options Differential diagnosis includes: Environmental exposure Co-morbid conditions: Schizophrenia, hypertension  Additional history/records obtained and reviewed: Additional history obtained from  outside medical records External records from outside source obtained and reviewed including several emergency department visits reviewed, and records from social services.  Clinical Course as of 06/03/24 0846  Tue Jun 03, 2024  0652 Temp(!): 97.5 F (36.4 C) Patient afebrile, vitals stable, in no acute  distress, eating on exam.  [ML]  0800 Notified that patient eloped [ML]    Clinical Course User Index [ML] Willma Duwaine CROME, PA   ED Course / Reassessments: Problem List: cold exposure 43 year old male presented for cold exposure. Initial assessment included history, physical exam, and review of prior medical records.  The patient was seen in this emergency department a few hours prior with full laboratory workup that was grossly unremarkable, and then became aggressive with staff upon discharge.  The patient has now returned stating that he had a cold exposure while waiting for the bus outside.  Patient was given a warm blanket, meal, and the patient vital signs were obtained and monitored, and the patient remained stable throughout the stay.  Patient eloped after eating, before further evaluation or  workup could be completed.  Social determinants impacting care: mental health/psychiatric barriers   Disposition: Disposition: Eloped  This note was produced using Electronics Engineer. While the provider has reviewed and verified all clinical information, transcription errors may remain.      Final diagnoses:  None    ED Discharge Orders     None          Willma Duwaine CROME, [place] 06/03/24 9152    Franklyn Sid SAILOR, MD 06/03/24 1049

## 2024-06-03 NOTE — ED Notes (Signed)
 Pt became extremely aggressive upon discharge

## 2024-06-03 NOTE — ED Triage Notes (Signed)
 Pt arrived via POV c/o cold exposure. Pt states that it's cold outside and he would like a multi vitamin

## 2024-06-03 NOTE — ED Provider Notes (Signed)
 [place] EMERGENCY DEPARTMENT AT [facility] Provider Note   CSN: [id number] Arrival date & time: 06/02/24  2047     Patient presents with: Diarrhea   Aaron Rubio is a 43 y.o. male.   The history is provided by the patient and medical records.  Diarrhea  43 year old male with history of schizophrenia, hypertension, substance abuse, presenting to the ED with complaint of diarrhea.  States he was downtown today when he started having some stomach cramps and loose, watery diarrhea.  He denies any nausea or vomiting.  No fever or chills.  Denies any sick contacts with similar.  Of note, triage RN attempted to place patient back into lobby after lab draw but stated he was suicidal so he could not go to the lobby.  He made no comment about SI/HI/AVH to me during any portion of my history and physical exam.  Prior to Admission medications   Medication Sig Start Date End Date Taking? Authorizing Provider  lisinopril  (ZESTRIL ) 5 MG tablet Take 5 mg by mouth daily. Patient not taking: Reported on 05/02/2024    [provider]  risperiDONE  (RISPERDAL ) 1 MG tablet Take 1 tablet (1 mg total) by mouth 2 (two) times daily. 05/02/24   Rogelia Jerilynn RAMAN, MD    Allergies: Patient has no known allergies.    Review of Systems  Gastrointestinal:  Positive for diarrhea.  All other systems reviewed and are negative.   Updated Vital Signs BP (!) 161/105 (BP Location: Right Arm)   Pulse 72   Temp 97.7 F (36.5 C)   Resp 20   Ht 6' 2 (1.88 m)   Wt 96.6 kg   SpO2 100%   BMI 27.34 kg/m   Physical Exam Vitals and nursing note reviewed.  Constitutional:      Appearance: He is well-developed.     Comments: Sleeping in triage, awoken for exam  HENT:     Head: Normocephalic and atraumatic.  Eyes:     Conjunctiva/sclera: Conjunctivae normal.     Pupils: Pupils are equal, round, and reactive to light.  Cardiovascular:     Rate and Rhythm: Normal rate and regular  rhythm.     Heart sounds: Normal heart sounds.  Pulmonary:     Effort: Pulmonary effort is normal.     Breath sounds: Normal breath sounds.  Abdominal:     General: Bowel sounds are normal.     Palpations: Abdomen is soft.     Tenderness: There is no abdominal tenderness. There is no rebound.  Musculoskeletal:        General: Normal range of motion.     Cervical back: Normal range of motion.  Skin:    General: Skin is warm and dry.  Neurological:     Mental Status: He is oriented to person, place, and time.  Psychiatric:     Comments: No voiced SI/HI/AVH     (all labs ordered are listed, but only abnormal results are displayed) Labs Reviewed  COMPREHENSIVE METABOLIC PANEL WITH GFR - Abnormal; Notable for the following components:      Result Value   Calcium 8.7 (*)    Total Protein 6.3 (*)    All other components within normal limits  CBC - Abnormal; Notable for the following components:   MCH 25.9 (*)    All other components within normal limits  ETHANOL  RAPID URINE DRUG SCREEN, HOSP PERFORMED    EKG: None  Radiology: No results found.   Procedures  Medications Ordered in the ED - No data to display                                  Medical Decision Making  43 year old male here with stomach cramps and diarrhea.    He is afebrile and nontoxic in appearance here.  He is sleeping and in no acute distress, awoken for exam.  Abdomen is soft and benign.  Labs are grossly reassuring without leukocytosis or electrolyte derangement.  Patient made no report of SI/HI/AVH during any portion of my encounter with him.  It appears he said this to triage RN only after he was asked to go to the lobby.  He has a longstanding history of similar behavior.  I do not feel he poses an immediate threat to himself or others at this time and do not feel he needs emergent psychiatric evaluation.  He can follow-up outpatient if needed.  Can return here for new concerns.  Final  diagnoses:  Diarrhea, unspecified type    ED Discharge Orders     None          Jarold Olam HERO, PA-C 06/03/24 0147    Theadore Ozell HERO, MD 06/03/24 [phone]

## 2024-06-03 NOTE — Discharge Instructions (Signed)
 Your labs today are normal. You can follow-up with behavioral health outpatient if you have additional concerns. Return here for new concerns.

## 2024-06-03 NOTE — ED Triage Notes (Signed)
 Pt arrived POV c/o being cold

## 2024-06-04 DIAGNOSIS — X31XXXA Exposure to excessive natural cold, initial encounter: Secondary | ICD-10-CM | POA: Insufficient documentation

## 2024-06-04 DIAGNOSIS — T699XXA Effect of reduced temperature, unspecified, initial encounter: Secondary | ICD-10-CM | POA: Insufficient documentation

## 2024-06-04 DIAGNOSIS — Z5900 Homelessness unspecified: Secondary | ICD-10-CM | POA: Insufficient documentation

## 2024-06-04 MED ORDER — ADULT MULTIVITAMIN W/MINERALS CH
1.0000 | ORAL_TABLET | Freq: Once | ORAL | Status: AC
  Administered 2024-06-04: 1 via ORAL
  Filled 2024-06-04: qty 1

## 2024-06-04 NOTE — ED Notes (Signed)
 Patient has refused his vitals being taken.

## 2024-06-04 NOTE — ED Notes (Signed)
 Patient's temp did not read. Attempted 3x.

## 2024-06-04 NOTE — ED Triage Notes (Signed)
 POV/ homeless/ c/o being cold and fingers are numb/ pt is ambulatory/ A&OX4

## 2024-06-04 NOTE — ED Provider Notes (Signed)
  [facility] Emergency Department Provider Note MRN:  [id number]  Arrival date & time: 06/04/24     Chief Complaint   Cold Exposure   History of Present Illness   Aaron Rubio is a 43 y.o. year-old male presents to the ED with chief complaint of cold exposure.  States that he has been cold and is outside.  Requesting a multivitamin.  Denies any other complaints.  Was seen earlier this morning for the same.[name]  History provided by patient.   Review of Systems  Pertinent positive and negative review of systems noted in HPI.    Physical Exam   Vitals:   06/03/24 1929 06/03/24 2345  BP: (!) 140/80 (!) 151/109  Pulse: 78 71  Resp: 16 15  Temp: 98.4 F (36.9 C) 97.9 F (36.6 C)  SpO2: 99% 100%    CONSTITUTIONAL:  non toxic-appearing, NAD NEURO:  Alert and oriented x 3, CN 3-12 grossly intact EYES:  eyes equal and reactive ENT/NECK:  Supple, no stridor  CARDIO:  normal rate, regular rhythm, appears well-perfused  PULM:  No respiratory distress, CTAB GI/GU:  non-distended,  MSK/SPINE:  No gross deformities, no edema, moves all extremities  SKIN:  no rash, , atraumatic   *Additional and/or pertinent findings included in MDM below  Diagnostic and Interventional Summary    EKG Interpretation Date/Time:    Ventricular Rate:    PR Interval:    QRS Duration:    QT Interval:    QTC Calculation:   R Axis:      Text Interpretation:         Labs Reviewed - No data to display  No orders to display    Medications  multivitamin with minerals tablet 1 tablet (1 tablet Oral Given 06/04/24 0443)     Procedures  /  Critical Care Procedures  ED Course and Medical Decision Making  I have reviewed the triage vital signs, the nursing notes, and pertinent available records from the EMR.  Social Determinants Affecting Complexity of Care: Patient is homelessness.   ED Course:    Medical Decision Making        Consultants: No  consultations were needed in caring for this patient.   Treatment and Plan: Emergency department workup does not suggest an emergent condition requiring admission or immediate intervention beyond  what has been performed at this time. The patient is safe for discharge and has  been instructed to return immediately for worsening symptoms, change in  symptoms or any other concerns    Final Clinical Impressions(s) / ED Diagnoses     ICD-10-CM   1. Homelessness  Z59.00       ED Discharge Orders     None         Discharge Instructions Discussed with and Provided to Patient:   Discharge Instructions   None      Vicky Charleston, PA-C 06/04/24 9447    Haze Lonni PARAS, MD 06/05/24 [phone]

## 2024-06-05 ENCOUNTER — Encounter (HOSPITAL_COMMUNITY): Payer: Self-pay | Admitting: *Deleted

## 2024-06-05 ENCOUNTER — Emergency Department (HOSPITAL_COMMUNITY)
Admission: EM | Admit: 2024-06-05 | Discharge: 2024-06-06 | Disposition: A | Attending: Emergency Medicine | Admitting: Emergency Medicine

## 2024-06-05 ENCOUNTER — Other Ambulatory Visit: Payer: Self-pay

## 2024-06-05 ENCOUNTER — Emergency Department (HOSPITAL_COMMUNITY)
Admission: EM | Admit: 2024-06-05 | Discharge: 2024-06-05 | Disposition: A | Attending: Emergency Medicine | Admitting: Emergency Medicine

## 2024-06-05 DIAGNOSIS — Z5900 Homelessness unspecified: Secondary | ICD-10-CM | POA: Insufficient documentation

## 2024-06-05 DIAGNOSIS — Z79899 Other long term (current) drug therapy: Secondary | ICD-10-CM | POA: Insufficient documentation

## 2024-06-05 DIAGNOSIS — I10 Essential (primary) hypertension: Secondary | ICD-10-CM | POA: Insufficient documentation

## 2024-06-05 DIAGNOSIS — T699XXA Effect of reduced temperature, unspecified, initial encounter: Secondary | ICD-10-CM | POA: Insufficient documentation

## 2024-06-05 DIAGNOSIS — X31XXXA Exposure to excessive natural cold, initial encounter: Secondary | ICD-10-CM | POA: Insufficient documentation

## 2024-06-05 DIAGNOSIS — X398XXA Other exposure to forces of nature, initial encounter: Secondary | ICD-10-CM

## 2024-06-05 MED ORDER — ADULT MULTIVITAMIN W/MINERALS CH
1.0000 | ORAL_TABLET | Freq: Once | ORAL | Status: AC
  Administered 2024-06-05: 1 via ORAL
  Filled 2024-06-05: qty 1

## 2024-06-05 MED ORDER — ACETAMINOPHEN 325 MG PO TABS
650.0000 mg | ORAL_TABLET | Freq: Once | ORAL | Status: AC
  Administered 2024-06-05: 650 mg via ORAL
  Filled 2024-06-05: qty 2

## 2024-06-05 NOTE — Discharge Instructions (Addendum)
 Please use the resources provided for local shelters during this cold weather.

## 2024-06-05 NOTE — ED Provider Notes (Signed)
 [place] EMERGENCY DEPARTMENT AT [facility] Provider Note   CSN: [id number] Arrival date & time: 06/04/24  2352     Patient presents with: Cold Exposure   Aaron Rubio is a 43 y.o. male.   Aaron Rubio is a 44 y.o. male who presents to the ED with a chief complaint of cold exposure.  Patient reports he is homeless and has been outside in the cold.  He reports some pain in his feet and legs from standing and walking in the cold.  Has been wearing 2 pairs of socks and tissues.  He is requesting a multivitamin and a Tylenol  as well as a bus pass to help him get to social services.  No other complaints.   The history is provided by the patient and medical records.       Prior to Admission medications   Medication Sig Start Date End Date Taking? Authorizing Provider  lisinopril  (ZESTRIL ) 5 MG tablet Take 5 mg by mouth daily. Patient not taking: Reported on 05/02/2024    [provider]  risperiDONE  (RISPERDAL ) 1 MG tablet Take 1 tablet (1 mg total) by mouth 2 (two) times daily. 05/02/24   Rogelia Jerilynn RAMAN, MD    Allergies: Patient has no known allergies.    Review of Systems  Constitutional:  Negative for chills and fever.  Musculoskeletal:  Positive for myalgias.  All other systems reviewed and are negative.   Updated Vital Signs BP (!) 167/106 (BP Location: Left Arm)   Pulse 73   Temp 97.9 F (36.6 C)   Resp 16   Ht 6' 2 (1.88 m)   Wt 96.6 kg   SpO2 100%   BMI 27.34 kg/m   Physical Exam Vitals and nursing note reviewed.  Constitutional:      General: He is not in acute distress.    Appearance: Normal appearance. He is well-developed. He is not ill-appearing or diaphoretic.  HENT:     Head: Normocephalic and atraumatic.  Eyes:     General:        Right eye: No discharge.        Left eye: No discharge.  Cardiovascular:     Rate and Rhythm: Normal rate and regular rhythm.  Pulmonary:     Effort: Pulmonary effort is normal.  No respiratory distress.     Breath sounds: Normal breath sounds.  Musculoskeletal:     Comments: Bilateral hands and feet without wounds, erythema or signs of frostbite, radial and DP pulses 2+ with brisk cap refill in all extremities.  Skin:    General: Skin is warm and dry.  Neurological:     Mental Status: He is alert and oriented to person, place, and time.     Coordination: Coordination normal.  Psychiatric:        Mood and Affect: Mood normal.        Behavior: Behavior normal.     (all labs ordered are listed, but only abnormal results are displayed) Labs Reviewed - No data to display  EKG: None  Radiology: No results found.   Procedures   Medications Ordered in the ED  acetaminophen  (TYLENOL ) tablet 650 mg (has no administration in time range)  multivitamin with minerals tablet 1 tablet (has no administration in time range)  Medical Decision Making  Patient presenting with cold exposure, reassuring evaluation in the ED with body temp of 97.9 patient is alert and mentating well with no wounds or erythema to the extremities to suggest frostbite.  Patient given meal and bus passes as well as resources for shelters.  Social determinants of health: Homelessness  At this time there does not appear to be any evidence of an acute emergency medical condition requiring further emergent evaluation and the patient appears stable for discharge with appropriate outpatient follow up. Diagnosis and return precautions discussed with patient who verbalizes understanding and is agreeable to discharge.        Final diagnoses:  Exposure to weather condition, initial encounter    ED Discharge Orders     None          Lothar Prehn F, PA-C 06/05/24 9157    Pamella Ozell LABOR, DO 06/05/24 1553

## 2024-06-05 NOTE — ED Provider Triage Note (Signed)
 Emergency Medicine Provider Triage Evaluation Note  Markey Deady , a 43 y.o. male  was evaluated in triage.  Pt complains of wanting to sleep, cold outside.  Review of Systems  Positive:  Negative:   Physical Exam  BP (!) 144/86   Pulse 91   Temp 98.1 F (36.7 C)   Resp 18   SpO2 100%  Gen:   Sleeping, but awakens easily and wants to go back to sleep.  Resp:  Normal effort  MSK:   Moves extremities without difficulty  Other:    Medical Decision Making  Medically screening exam initiated at 9:34 PM.  Appropriate orders placed.  Syaire Saber was informed that the remainder of the evaluation will be completed by another provider, this initial triage assessment does not replace that evaluation, and the importance of remaining in the ED until their evaluation is complete.  Patient is experiencing homelessness and sought shelter in the ER.   Bernis Ernst, [place] 06/05/24 2137

## 2024-06-05 NOTE — ED Triage Notes (Signed)
 Pt reports that he is experiencing homelessness and has gotten very cold while outside and is concerned about hypothermia.

## 2024-06-06 ENCOUNTER — Ambulatory Visit (HOSPITAL_COMMUNITY): Admission: EM | Admit: 2024-06-06 | Discharge: 2024-06-06 | Disposition: A | Discharge status: Home / Self Care

## 2024-06-06 ENCOUNTER — Encounter (HOSPITAL_COMMUNITY): Payer: Self-pay

## 2024-06-06 ENCOUNTER — Emergency Department (HOSPITAL_COMMUNITY)
Admission: EM | Admit: 2024-06-06 | Discharge: 2024-06-06 | Disposition: A | Discharge status: Home / Self Care | Attending: Emergency Medicine | Admitting: Emergency Medicine

## 2024-06-06 ENCOUNTER — Other Ambulatory Visit: Payer: Self-pay

## 2024-06-06 DIAGNOSIS — Z765 Malingerer [conscious simulation]: Secondary | ICD-10-CM

## 2024-06-06 DIAGNOSIS — M79671 Pain in right foot: Secondary | ICD-10-CM | POA: Diagnosis present

## 2024-06-06 DIAGNOSIS — M79672 Pain in left foot: Secondary | ICD-10-CM | POA: Insufficient documentation

## 2024-06-06 DIAGNOSIS — Z5900 Homelessness unspecified: Secondary | ICD-10-CM

## 2024-06-06 DIAGNOSIS — R45851 Suicidal ideations: Secondary | ICD-10-CM

## 2024-06-06 MED ORDER — ACETAMINOPHEN 500 MG PO TABS
1000.0000 mg | ORAL_TABLET | ORAL | Status: AC
  Administered 2024-06-06: 1000 mg via ORAL
  Filled 2024-06-06: qty 2

## 2024-06-06 MED ORDER — IBUPROFEN 800 MG PO TABS
800.0000 mg | ORAL_TABLET | Freq: Once | ORAL | Status: DC
  Filled 2024-06-06: qty 1

## 2024-06-06 NOTE — ED Provider Notes (Signed)
  EMERGENCY DEPARTMENT AT [facility] Provider Note   CSN: [id number] Arrival date & time: 06/06/24  1738     Patient presents with: Legs/Feet hurt   Aaron Rubio is a 43 y.o. male.   43 yo M with a chief complaint of bilateral foot pain.  This been going on for some time.  Worse to the pads of the feet bilaterally.  He has tried changing shoes he tried changing socks but nothing seems to work for very long.  He denies injury to the area.  Denies fevers.        Prior to Admission medications   Medication Sig Start Date End Date Taking? Authorizing Provider  lisinopril  (ZESTRIL ) 5 MG tablet Take 5 mg by mouth daily. Patient not taking: Reported on 05/02/2024    [provider]  risperiDONE  (RISPERDAL ) 1 MG tablet Take 1 tablet (1 mg total) by mouth 2 (two) times daily. 05/02/24   Rogelia Jerilynn RAMAN, MD    Allergies: Patient has no known allergies.    Review of Systems  Updated Vital Signs BP (!) 142/91 (BP Location: Right Arm)   Pulse (!) 59   Temp 98.1 F (36.7 C)   Resp 16   SpO2 100%   Physical Exam Vitals and nursing note reviewed.  Constitutional:      Appearance: He is well-developed.  HENT:     Head: Normocephalic and atraumatic.  Eyes:     Pupils: Pupils are equal, round, and reactive to light.  Neck:     Vascular: No JVD.  Cardiovascular:     Rate and Rhythm: Normal rate and regular rhythm.     Heart sounds: No murmur heard.    No friction rub. No gallop.  Pulmonary:     Effort: No respiratory distress.     Breath sounds: No wheezing.  Abdominal:     General: There is no distension.     Tenderness: There is no abdominal tenderness. There is no guarding or rebound.  Musculoskeletal:        General: Normal range of motion.     Cervical back: Normal range of motion and neck supple.     Comments: The patient's feet appear fairly well taken care of.  I do not appreciate any obvious signs of athlete's foot.  There is  no erythema or warmth.  Pulse motor and sensation are intact.  No obvious pain on palpation about the pad of the foot where he has discomfort.   Skin:    Coloration: Skin is not pale.     Findings: No rash.  Neurological:     Mental Status: He is alert and oriented to person, place, and time.  Psychiatric:        Behavior: Behavior normal.     (all labs ordered are listed, but only abnormal results are displayed) Labs Reviewed - No data to display  EKG: None  Radiology: No results found.   Procedures   Medications Ordered in the ED  ibuprofen  (ADVIL ) tablet 800 mg (has no administration in time range)                                    Medical Decision Making Risk Prescription drug management.   44 yo M with a chief complaints of bilateral foot pain.  This has been going on for a long time.  He has no obvious finding on  physical exam.  Most likely overuse injury as the patient is homeless.  11:06 PM:  I have discussed the diagnosis/risks/treatment options with the patient.  Evaluation and diagnostic testing in the emergency department does not suggest an emergent condition requiring admission or immediate intervention beyond what has been performed at this time.  They will follow up with PCP, podiatry. We also discussed returning to the ED immediately if new or worsening sx occur. We discussed the sx which are most concerning (e.g., sudden worsening pain, fever, inability to tolerate by mouth) that necessitate immediate return. Medications administered to the patient during their visit and any new prescriptions provided to the patient are listed below.  Medications given during this visit Medications  ibuprofen  (ADVIL ) tablet 800 mg (has no administration in time range)     The patient appears reasonably screen and/or stabilized for discharge and I doubt any other medical condition or other [facility] requiring further screening, evaluation, or treatment in the ED at this time  prior to discharge.       Final diagnoses:  Foot pain, bilateral    ED Discharge Orders     None          Emil Share, DO 06/06/24 2306
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
## 2024-06-06 NOTE — ED Triage Notes (Signed)
 Patient reports cold weather outside , just discharge 10 mins ago here for the same complaints .

## 2024-06-06 NOTE — Discharge Instructions (Addendum)
 You might need a change of footwear.  I have given you information to follow-up with the podiatry clinic if you so choose.  Take 4 over the counter ibuprofen  tablets 3 times a day or 2 over-the-counter naproxen  tablets twice a day for pain. Also take tylenol  1000mg (2 extra strength) four times a day.

## 2024-06-06 NOTE — Discharge Instructions (Addendum)

## 2024-06-06 NOTE — ED Notes (Signed)
 Patient discharged home with resources per provider order. After Visit Summary (AVS) printed and given to patient. AVS reviewed with patient and all questions fully answered. Patient discharged in no acute distress, A& O x4 and ambulatory. Patient provided with snacks, juice, and a bus pass. Patient belongings returned to patient from locker complete and intact. Safety maintained.

## 2024-06-06 NOTE — ED Notes (Signed)
 Pt agitated stating he asked for my multi vitamin and my motrin 

## 2024-06-06 NOTE — ED Notes (Signed)
Patient verbalizes understanding of discharge instructions. Opportunity for questioning and answers were provided. Pt given food and beverage. Pt discharged from ED.

## 2024-06-06 NOTE — ED Provider Notes (Signed)
 [place] EM[facility] DEPARTMENT AT [facility] Provider Note   CSN: [id number] Arrival date & time: 06/05/24  1816     Patient presents with: Cold Exposure   Aaron Rubio is a 43 y.o. male.   HPI     This is a 43 year old male who presents with concern for COVID exposure.  Patient is homeless.  He is well-known to our emergency department.  He states that he cannot sleep outside because of how cold it was.  He does not have a jacket or head covering.  He states that he was unable to get into a shelter.  He gives vigorous reasons for this including not feeling safe.  He has no physical complaints at this time.  Prior to Admission medications   Medication Sig Start Date End Date Taking? Authorizing Provider  lisinopril  (ZESTRIL ) 5 MG tablet Take 5 mg by mouth daily. Patient not taking: Reported on 05/02/2024    [provider]  risperiDONE  (RISPERDAL ) 1 MG tablet Take 1 tablet (1 mg total) by mouth 2 (two) times daily. 05/02/24   Rogelia Jerilynn RAMAN, MD    Allergies: Patient has no known allergies.    Review of Systems  Constitutional:  Negative for fever.  Respiratory:  Negative for shortness of breath.   Cardiovascular:  Negative for chest pain.  All other systems reviewed and are negative.   Updated Vital Signs BP (!) 148/103 (BP Location: Right Arm)   Pulse 74   Temp 97.6 F (36.4 C)   Resp 18   SpO2 99%   Physical Exam Vitals and nursing note reviewed.  Constitutional:      Appearance: He is well-developed. He is not ill-appearing.  HENT:     Head: Normocephalic and atraumatic.  Eyes:     Pupils: Pupils are equal, round, and reactive to light.  Cardiovascular:     Rate and Rhythm: Normal rate and regular rhythm.  Pulmonary:     Effort: Pulmonary effort is normal. No respiratory distress.  Skin:    General: Skin is warm and dry.  Neurological:     Mental Status: He is alert and oriented to person, place, and time.  Psychiatric:         Mood and Affect: Mood normal.     (all labs ordered are listed, but only abnormal results are displayed) Labs Reviewed - No data to display  EKG: None  Radiology: No results found.   Procedures   Medications Ordered in the ED - No data to display                                  Medical Decision Making  This patient presents to the ED for concern of cold exposure, this involves an extensive number of treatment options, and is a complaint that carries with it a high risk of complications and morbidity.  I considered the following differential and admission for this acute, potentially life threatening condition.  The differential diagnosis includes homelessness, hypothermia  MDM:    This is a 43 year old male well-known to our emergency department homeless who presents with concern for cold exposure.  He was in the waiting room for greater than 10 hours.  Has no physical complaints at this time.  At this time he has been sheltered from the cold and without risk for hypothermia.  Do not feel he needs any workup.  Discussed with him that it  is very important for him to seek out, shelter resources.  These were provided to him.  The emergency department is not a shelter although I appreciate his need to feel warm given current weather environment.  (Labs, imaging, consults)  Labs: I Ordered, and personally interpreted labs.  The pertinent results include: N/A  Imaging Studies ordered: I ordered imaging studies including N/A I independently visualized and interpreted imaging. I agree with the radiologist interpretation  Additional history obtained from chart review.  External records from outside source obtained and reviewed including evaluations  Cardiac Monitoring: The patient was not maintained on a cardiac monitor.  If on the cardiac monitor, I personally viewed and interpreted the cardiac monitored which showed an underlying rhythm of: N/A  Reevaluation: After the  interventions noted above, I reevaluated the patient and found that they have :resolved  Social Determinants of Health:  lives independently  Disposition: Discharge  Co morbidities that complicate the patient evaluation  Past Medical History:  Diagnosis Date   Hypertension    Schizophrenia (HCC)      Medicines No orders of the defined types were placed in this encounter.   I have reviewed the patients home medicines and have made adjustments as needed  Problem List / ED Course: Problem List Items Addressed This Visit       Other   Homelessness - Primary             Final diagnoses:  Homelessness    ED Discharge Orders     None          Ariannie Penaloza, Charmaine FALCON, MD 06/06/24 [phone]

## 2024-06-06 NOTE — ED Notes (Signed)
 When you have time, Clayton Bosserman (sister) [phone] would like to speak to pt.

## 2024-06-06 NOTE — ED Provider Notes (Signed)
 Aaron Rubio   CSN: [id number] Arrival date & time: 06/06/24  9463     Patient presents with: [facility] Aaron Rubio is a 43 y.o. male.   HPI  Patient is a 43 year old male who is homeless and well-known to the emergency department for frequent visits.  Patient was discharged at 5 AM and then seemingly checked back in approximately 20 minutes later.  He states that his complaint is headache he states that it has been going on for the last hour it is frontal nonradiating does not seem to be worse with position changes it is not exertional he denies any numbness weakness slurred speech denies any head injuries.       Prior to Admission medications   Medication Sig Start Date End Date Taking? Authorizing Provider  lisinopril  (ZESTRIL ) 5 MG tablet Take 5 mg by mouth daily. Patient not taking: Reported on 05/02/2024    [provider]  risperiDONE  (RISPERDAL ) 1 MG tablet Take 1 tablet (1 mg total) by mouth 2 (two) times daily. 05/02/24   Rogelia Jerilynn RAMAN, MD    Allergies: Patient has no known allergies.    Review of Systems  Updated Vital Signs BP (!) 151/101 (BP Location: Right Arm)   Pulse 71   Temp 97.6 F (36.4 C)   Resp 20   SpO2 100%   Physical Exam Vitals and nursing Rubio reviewed.  Constitutional:      General: He is not in acute distress. HENT:     Head: Normocephalic and atraumatic.     Nose: Nose normal.  Eyes:     General: No scleral icterus. Cardiovascular:     Rate and Rhythm: Normal rate and regular rhythm.     Pulses: Normal pulses.     Heart sounds: Normal heart sounds.  Pulmonary:     Effort: Pulmonary effort is normal. No respiratory distress.     Breath sounds: No wheezing.  Abdominal:     Palpations: Abdomen is soft.     Tenderness: There is no abdominal tenderness.  Musculoskeletal:     Cervical back: Normal range of motion.     Right lower leg: No edema.      Left lower leg: No edema.  Skin:    General: Skin is warm and dry.     Capillary Refill: Capillary refill takes less than 2 seconds.  Neurological:     Mental Status: He is alert. Mental status is at baseline.     Comments: Alert and oriented to self, place, time and event.   Speech is fluent, clear without dysarthria or dysphasia.   Strength 5/5 in upper/lower extremities   Sensation intact in upper/lower extremities   CN I not tested  CN II grossly intact visual fields bilaterally. Did not visualize posterior eye.  CN III, IV, VI PERRLA and EOMs intact bilaterally  CN V Intact sensation to sharp and light touch to the face  CN VII facial movements symmetric  CN VIII not tested  CN IX, X no uvula deviation, symmetric rise of soft palate  CN XI 5/5 SCM and trapezius strength bilaterally  CN XII Midline tongue protrusion, symmetric L/R movements   Psychiatric:        Mood and Affect: Mood normal.        Behavior: Behavior normal.     (all labs ordered are listed, but only abnormal results are displayed) Labs Reviewed - No data to display  EKG: None  Radiology: No results found.   Procedures   Medications Ordered in the ED  acetaminophen  (TYLENOL ) tablet 1,000 mg (has no administration in time range)                                    Medical Decision Making Risk OTC drugs.   Patient is a 43 year old male who is homeless and well-known to the emergency department for frequent visits.  Patient was discharged at 5 AM and then seemingly checked back in approximately 20 minutes later.  He states that his complaint is headache he states that it has been going on for the last hour it is frontal nonradiating does not seem to be worse with position changes it is not exertional he denies any numbness weakness slurred speech denies any head injuries.  Neurologic exam is normal.  Patient is overall well-appearing.  He is requesting food and Tylenol .  I am happy to provide  this for him.  I am reassured from a physical exam and history standpoint.  His gradual onset nonexertional headache with a normal neurologic exam is overall reassuring.  I did consider diagnoses such as subarachnoid hemorrhages/other intraparenchymal hemorrhage.  I have a very low suspicion for this.  I discussed additional workup with patient who is primarily interested in having food.  I suspect that this is a visit for primarily secondary gain although these are very rudimentary needs that he is asking for.  I will discharge him after Tylenol  and will provide him with homeless resources.   Final diagnoses:  [facility]  Malingerer    ED Discharge Orders     None          Aaron Rubio, Aaron Rubio 06/06/24 0914    Aaron Ozell LABOR, DO 06/11/24 [phone]

## 2024-06-06 NOTE — Discharge Instructions (Signed)
 You were seen today for concerns for cold exposure.  I understand your concerns regarding this.  It is very important that you seek resources as the emergency department is not a shelter.

## 2024-06-06 NOTE — ED Triage Notes (Signed)
 PT report to be Anemic, Pain on feet and legs 10 out of 10. Ambulatory on triage
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
## 2024-06-07 ENCOUNTER — Emergency Department (HOSPITAL_COMMUNITY)
Admission: EM | Admit: 2024-06-07 | Discharge: 2024-06-07 | Disposition: A | Discharge status: Home / Self Care | Attending: Emergency Medicine | Admitting: Emergency Medicine

## 2024-06-07 ENCOUNTER — Other Ambulatory Visit: Payer: Self-pay

## 2024-06-07 ENCOUNTER — Encounter (HOSPITAL_COMMUNITY): Payer: Self-pay

## 2024-06-07 DIAGNOSIS — Z5900 Homelessness unspecified: Secondary | ICD-10-CM | POA: Insufficient documentation

## 2024-06-07 DIAGNOSIS — Z79899 Other long term (current) drug therapy: Secondary | ICD-10-CM | POA: Insufficient documentation

## 2024-06-07 DIAGNOSIS — M79672 Pain in left foot: Secondary | ICD-10-CM

## 2024-06-07 DIAGNOSIS — I10 Essential (primary) hypertension: Secondary | ICD-10-CM | POA: Insufficient documentation

## 2024-06-07 DIAGNOSIS — Z765 Malingerer [conscious simulation]: Secondary | ICD-10-CM | POA: Insufficient documentation

## 2024-06-07 LAB — COMPREHENSIVE METABOLIC PANEL WITH GFR
ALT: 17 U/L (ref 0–44)
AST: 26 U/L (ref 15–41)
Albumin: 3.6 g/dL (ref 3.5–5.0)
Alkaline Phosphatase: 62 U/L (ref 38–126)
Anion gap: 4 — ABNORMAL LOW (ref 5–15)
BUN: 15 mg/dL (ref 6–20)
CO2: 27 mmol/L (ref 22–32)
Calcium: 9 mg/dL (ref 8.9–10.3)
Chloride: 107 mmol/L (ref 98–111)
Creatinine, Ser: 1.39 mg/dL — ABNORMAL HIGH (ref 0.61–1.24)
GFR, Estimated: >60 mL/min (ref >60)
Glucose, Bld: 66 mg/dL — ABNORMAL LOW (ref 70–99)
Potassium: 4.1 mmol/L (ref 3.5–5.1)
Sodium: 138 mmol/L (ref 135–145)
Total Bilirubin: 0.2 mg/dL (ref 0.0–1.2)
Total Protein: 6.2 g/dL — ABNORMAL LOW (ref 6.5–8.1)

## 2024-06-07 LAB — URINALYSIS, ROUTINE W REFLEX MICROSCOPIC
Bilirubin Urine: NEGATIVE
Glucose, UA: NEGATIVE mg/dL
Hgb urine dipstick: NEGATIVE
Ketones, ur: NEGATIVE mg/dL
Leukocytes,Ua: NEGATIVE
Nitrite: NEGATIVE
Protein, ur: NEGATIVE mg/dL
Specific Gravity, Urine: 1.015 (ref 1.005–1.030)
pH: 8 (ref 5.0–8.0)

## 2024-06-07 LAB — CBC
HCT: 43 % (ref 39.0–52.0)
Hemoglobin: 13.7 g/dL (ref 13.0–17.0)
MCH: 26.1 pg (ref 26.0–34.0)
MCHC: 31.9 g/dL (ref 30.0–36.0)
MCV: 81.9 fL (ref 80.0–100.0)
Platelets: 220 K/uL (ref 150–400)
RBC: 5.25 MIL/uL (ref 4.22–5.81)
RDW: 13.8 % (ref 11.5–15.5)
WBC: 5.9 K/uL (ref 4.0–10.5)
nRBC: 0 % (ref 0.0–0.2)

## 2024-06-07 LAB — LIPASE, BLOOD: Lipase: 28 U/L (ref 11–51)

## 2024-06-07 MED ORDER — ACETAMINOPHEN 500 MG PO TABS
1000.0000 mg | ORAL_TABLET | Freq: Once | ORAL | Status: AC
  Administered 2024-06-07: 1000 mg via ORAL
  Filled 2024-06-07: qty 2

## 2024-06-07 MED ORDER — ADULT MULTIVITAMIN W/MINERALS CH
1.0000 | ORAL_TABLET | Freq: Once | ORAL | Status: AC
  Administered 2024-06-07: 1 via ORAL
  Filled 2024-06-07: qty 1

## 2024-06-07 NOTE — ED Triage Notes (Signed)
 Patient reports feet pain .

## 2024-06-07 NOTE — Discharge Instructions (Addendum)
 It was a pleasure taking care of you today. You were seen in the Emergency Department for evaluation of bilateral foot pain. Your work-up was reassuring.  You are given some Tylenol  and a multivitamin while in the emergency department. Refer to the attached documentation for further management of your symptoms.  I have also given you a referral to the [facility] health family medicine center.  Please call them to schedule an appointment to establish care and to obtain a further workup.  Their contact information is listed on your discharge paperwork.  Please return to the ER if you experience chest pain, trouble breathing, intractable nausea/vomiting or any other life threatening illnesses.

## 2024-06-07 NOTE — ED Triage Notes (Signed)
 Pt c/o abdominal pain, cough, headache.

## 2024-06-07 NOTE — ED Notes (Signed)
 States he is here because his feet are hurting and its cold outside, wants tylenol  and muti-vit,

## 2024-06-07 NOTE — ED Provider Notes (Signed)
 [place] EMERGENCY DEPARTMENT AT [facility] Provider Note   CSN: [id number] Arrival date & time: 06/07/24  9490     Patient presents with: Feet Pain    Aaron Rubio is a 43 y.o. male with past medical history of hypertension, polysubstance use, chronic foot pain who presents emergency department for evaluation of bilateral foot pain.  Patient reports this is a chronic problem and he does not like being out in the cold.  He reports the pads of his feet are worse.  He denies any numbness or tingling.  No recent injuries.  Patient is requesting multivitamin.  HPI     Prior to Admission medications   Medication Sig Start Date End Date Taking? Authorizing Provider  lisinopril  (ZESTRIL ) 5 MG tablet Take 5 mg by mouth daily. Patient not taking: Reported on 05/02/2024    [provider]  risperiDONE  (RISPERDAL ) 1 MG tablet Take 1 tablet (1 mg total) by mouth 2 (two) times daily. 05/02/24   Rogelia Jerilynn RAMAN, MD    Allergies: Patient has no known allergies.    Review of Systems  Musculoskeletal:  Positive for myalgias.    Updated Vital Signs BP (!) 151/102 (BP Location: Right Arm)   Pulse 71   Temp 97.7 F (36.5 C)   Resp 20   SpO2 100%   Physical Exam Vitals and nursing note reviewed.  Constitutional:      Appearance: Normal appearance.  HENT:     Head: Normocephalic and atraumatic.     Mouth/Throat:     Mouth: Mucous membranes are moist.  Eyes:     General: No scleral icterus.       Right eye: No discharge.        Left eye: No discharge.     Conjunctiva/sclera: Conjunctivae normal.  Cardiovascular:     Rate and Rhythm: Normal rate and regular rhythm.     Pulses: Normal pulses.  Pulmonary:     Effort: Pulmonary effort is normal.     Breath sounds: Normal breath sounds.  Abdominal:     General: There is no distension.     Tenderness: There is no abdominal tenderness.  Musculoskeletal:        General: No deformity.     Cervical back:  Normal range of motion.     Comments: No wounds or ulcerations noted to plantar or dorsal surface of patient's feet.  Sensation is intact bilaterally.  Skin:    General: Skin is warm and dry.     Capillary Refill: Capillary refill takes less than 2 seconds.  Neurological:     Mental Status: He is alert.     Motor: No weakness.  Psychiatric:        Mood and Affect: Mood normal.     (all labs ordered are listed, but only abnormal results are displayed) Labs Reviewed - No data to display  EKG: None  Radiology: No results found.   Procedures   Medications Ordered in the ED  acetaminophen  (TYLENOL ) tablet 1,000 mg (1,000 mg Oral Given 06/07/24 0723)  multivitamin with minerals tablet 1 tablet (1 tablet Oral Given 06/07/24 0723)                                 Medical Decision Making  This patient presents to the ED for concern of foot pain, this involves an extensive number of treatment options, and is a complaint that carries  with it a high risk of complications and morbidity.  Differential diagnosis includes: Neuropathy, muscle strain, muscle sprain, fracture, dislocation  Co morbidities:  polysubstance abuse, hypertension, schizoaffective disorder, chronic foot pain  Social Determinants of Health:   homelessness  Additional history: Patient frequently evaluated in the emergency department for the same complaint.  He is typically given Tylenol  and a sandwich  Lab Tests:  Not indicated  Imaging Studies:  Not indicated  Cardiac Monitoring/ECG:  The patient was maintained on a cardiac monitor.  I personally viewed and interpreted the cardiac monitored which showed an underlying rhythm of: Sinus rhythm  Medicines ordered and prescription drug management:  I ordered medication including  Medications  acetaminophen  (TYLENOL ) tablet 1,000 mg (1,000 mg Oral Given 06/07/24 0723)  multivitamin with minerals tablet 1 tablet (1 tablet Oral Given 06/07/24 0723)   for pain  management Reevaluation of the patient after these medicines showed that the patient improved I have reviewed the patients home medicines and have made adjustments as needed  Test Considered:   none  Critical Interventions:   none  Consultations Obtained: None  Problem List / ED Course:     ICD-10-CM   1. Bilateral foot pain  M79.671    M79.672       MDM: 43 year old male who is frequently evaluated in the emergency department presents today for evaluation of bilateral foot pain.  Patient is requesting Tylenol  and a multivitamin.  On physical exam, there is no evidence of ulceration, erythema or other foot wound.  Sensation is intact bilaterally.  DP pulses 2+ bilaterally.  Patient admitted that he does not like being outside in the cold, but is aware that he cannot stay in the emergency department.  I gave the patient that is a milligrams of Tylenol  and a multivitamin.  His vital signs are stable.  Patient is appropriate for discharge at this time.   Dispostion:  After consideration of the diagnostic results and the patients response to treatment, I feel that the patient would benefit from supportive care.      Final diagnoses:  Bilateral foot pain    ED Discharge Orders     None          Torrence Marry RAMAN, [place] 06/07/24 9244

## 2024-06-07 NOTE — ED Notes (Signed)
 Patient discharged by RN. Patient verbalizes understanding of instructions. Patient with bus pass ambulatory to lobby.

## 2024-06-07 NOTE — ED Provider Notes (Signed)
 [place] EMERGENCY DEPARTMENT AT [facility] Provider Note   CSN: [id number] Arrival date & time: 06/07/24  1816     Patient presents with: multiple complaints   Aaron Rubio is a 43 y.o. male.   Pt is a 43 yo male with pmhx significant for schizophrenia, htn, and polysubstance abuse.  Pt is a frequent ED visitor.  He was here this am for the same.  He complains of bilateral foot pain from walking all the time.  He is homeless and does not have anywhere to stay.  It is cold outside.         Prior to Admission medications   Medication Sig Start Date End Date Taking? Authorizing Provider  lisinopril  (ZESTRIL ) 5 MG tablet Take 5 mg by mouth daily. Patient not taking: Reported on 05/02/2024    [provider]  risperiDONE  (RISPERDAL ) 1 MG tablet Take 1 tablet (1 mg total) by mouth 2 (two) times daily. 05/02/24   Rogelia Jerilynn RAMAN, MD    Allergies: Patient has no known allergies.    Review of Systems  Musculoskeletal:        Bilateral foot pain  All other systems reviewed and are negative.   Updated Vital Signs BP (!) 163/107 (BP Location: Right Arm)   Pulse 91   Temp 98.1 F (36.7 C)   Resp 19   Ht 6' 2 (1.88 m)   Wt 99.8 kg   SpO2 100%   BMI 28.25 kg/m   Physical Exam Vitals and nursing note reviewed.  Constitutional:      Appearance: Normal appearance.  HENT:     Head: Normocephalic and atraumatic.     Right Ear: External ear normal.     Left Ear: External ear normal.     Nose: Nose normal.     Mouth/Throat:     Mouth: Mucous membranes are moist.     Pharynx: Oropharynx is clear.  Eyes:     Extraocular Movements: Extraocular movements intact.     Conjunctiva/sclera: Conjunctivae normal.     Pupils: Pupils are equal, round, and reactive to light.  Cardiovascular:     Rate and Rhythm: Normal rate and regular rhythm.     Pulses: Normal pulses.     Heart sounds: Normal heart sounds.  Pulmonary:     Effort: Pulmonary effort is  normal.     Breath sounds: Normal breath sounds.  Abdominal:     General: Abdomen is flat. Bowel sounds are normal.     Palpations: Abdomen is soft.  Musculoskeletal:     Cervical back: Normal range of motion and neck supple.  Skin:    General: Skin is warm.     Capillary Refill: Capillary refill takes less than 2 seconds.  Neurological:     General: No focal deficit present.     Mental Status: He is alert and oriented to person, place, and time.  Psychiatric:        Mood and Affect: Mood normal.     (all labs ordered are listed, but only abnormal results are displayed) Labs Reviewed  URINALYSIS, ROUTINE W REFLEX MICROSCOPIC - Abnormal; Notable for the following components:      Result Value   APPearance CLOUDY (*)    All other components within normal limits  CBC  LIPASE, BLOOD  COMPREHENSIVE METABOLIC PANEL WITH GFR    EKG: None  Radiology: No results found.   Procedures   Medications Ordered in the ED  acetaminophen  (TYLENOL ) tablet  1,000 mg (has no administration in time range)                                    Medical Decision Making Amount and/or Complexity of Data Reviewed Labs: ordered.  Risk OTC drugs.   Pt is here because it's cold outside.  His feet were fully evaluated earlier today and yesterday.  No need for further eval.  He is given shelter info.      Final diagnoses:  Malingering  Homeless    ED Discharge Orders     None          Dean Clarity, MD 06/07/24 [phone]

## 2024-06-08 ENCOUNTER — Encounter (HOSPITAL_COMMUNITY): Payer: Self-pay | Admitting: *Deleted

## 2024-06-08 ENCOUNTER — Other Ambulatory Visit: Payer: Self-pay

## 2024-06-08 ENCOUNTER — Emergency Department (HOSPITAL_COMMUNITY)
Admission: EM | Admit: 2024-06-08 | Discharge: 2024-06-09 | Disposition: A | Source: Home / Self Care | Attending: Emergency Medicine | Admitting: Emergency Medicine

## 2024-06-08 ENCOUNTER — Emergency Department (HOSPITAL_COMMUNITY)
Admission: EM | Admit: 2024-06-08 | Discharge: 2024-06-08 | Disposition: A | Discharge status: Home / Self Care | Attending: Emergency Medicine | Admitting: Emergency Medicine

## 2024-06-08 ENCOUNTER — Encounter (HOSPITAL_COMMUNITY): Payer: Self-pay

## 2024-06-08 DIAGNOSIS — Z5900 Homelessness unspecified: Secondary | ICD-10-CM | POA: Insufficient documentation

## 2024-06-08 DIAGNOSIS — Z7689 Persons encountering health services in other specified circumstances: Secondary | ICD-10-CM

## 2024-06-08 DIAGNOSIS — Z765 Malingerer [conscious simulation]: Secondary | ICD-10-CM | POA: Insufficient documentation

## 2024-06-08 NOTE — ED Triage Notes (Signed)
 Pt BIB ems for having visual hallucinations and paranoid about people trying to kill him. On going, used to take meds for these issues but doesn't have his meds and doesn't have a way to get the meds currently. Not sure what meds he was taking. No injuries or falls.

## 2024-06-08 NOTE — ED Triage Notes (Signed)
 Pt here with complaints of being cold , BL foot pain, and wants to be examined by doctor.

## 2024-06-08 NOTE — ED Provider Notes (Incomplete)
  [place] EMERGENCY DEPARTMENT AT [facility] Provider Note   CSN: [id number] Arrival date & time: 06/08/24  2254     Patient presents with: Paranoid and Hallucinations   Aaron Rubio is a 43 y.o. male.  {Add pertinent medical, surgical, social history, OB history to HPI:32947}  43 y/o male with PMHx of HTN, cocaine use, paranoid schizophrenia, substance-induced mood disorder, depression, homelessness and malingering presents to the ED for ***        Prior to Admission medications   Medication Sig Start Date End Date Taking? Authorizing Provider  lisinopril  (ZESTRIL ) 5 MG tablet Take 5 mg by mouth daily. Patient not taking: Reported on 05/02/2024    [provider]  risperiDONE  (RISPERDAL ) 1 MG tablet Take 1 tablet (1 mg total) by mouth 2 (two) times daily. 05/02/24   Rogelia Jerilynn RAMAN, MD    Allergies: Patient has no known allergies.    Review of Systems Ten systems reviewed and are negative for acute change, except as noted in the HPI.    Updated Vital Signs BP (!) 157/101 (BP Location: Right Arm)   Pulse 70   Temp 98 F (36.7 C) (Oral)   Resp 18   SpO2 100%   Physical Exam Vitals and nursing note reviewed.  Constitutional:      General: He is not in acute distress.    Appearance: He is well-developed. He is not diaphoretic.     Comments: Patient appears at known baseline. Nontoxic.  HENT:     Head: Normocephalic and atraumatic.  Eyes:     General: No scleral icterus.    Conjunctiva/sclera: Conjunctivae normal.  Pulmonary:     Effort: Pulmonary effort is normal. No respiratory distress.  Musculoskeletal:        General: Normal range of motion.     Cervical back: Normal range of motion.  Skin:    General: Skin is warm and dry.     Coloration: Skin is not pale.     Findings: No erythema or rash.  Neurological:     Mental Status: He is alert and oriented to person, place, and time.  Psychiatric:        Mood and Affect:  Affect is blunt.        Speech: Speech is tangential.        Behavior: Behavior is agitated. Behavior is not aggressive.     (all labs ordered are listed, but only abnormal results are displayed) Labs Reviewed - No data to display  EKG: None  Radiology: No results found.  {Document cardiac monitor, telemetry assessment procedure when appropriate:32947} Procedures   Medications Ordered in the ED - No data to display    {Click here for ABCD2, HEART and other calculators REFRESH Note before signing:1}                              Medical Decision Making  ***  {Document critical care time when appropriate  Document review of labs and clinical decision tools ie CHADS2VASC2, etc  Document your independent review of radiology images and any outside records  Document your discussion with family members, caretakers and with consultants  Document social determinants of health affecting pt's care  Document your decision making why or why not admission, treatments were needed:32947:::1}   Final diagnoses:  None    ED Discharge Orders     None

## 2024-06-08 NOTE — ED Provider Notes (Signed)
 [place] EMERGENCY DEPARTMENT AT [facility] Provider Note   CSN: [id number] Arrival date & time: 06/08/24  1918     Patient presents with: Foot Pain   Aaron Rubio is a 43 y.o. male.   Pt is a 43 yo male with pmhx significant for schizophrenia, htn, and polysubstance abuse.  He is in the ED frequently.  I saw him yesterday and he is back again today because it's cold.  He wants to stay here.  He also wants me to call his grandmother and tell her that he needs to stay with her.       Prior to Admission medications   Medication Sig Start Date End Date Taking? Authorizing Provider  lisinopril  (ZESTRIL ) 5 MG tablet Take 5 mg by mouth daily. Patient not taking: Reported on 05/02/2024    [provider]  risperiDONE  (RISPERDAL ) 1 MG tablet Take 1 tablet (1 mg total) by mouth 2 (two) times daily. 05/02/24   Rogelia Jerilynn RAMAN, MD    Allergies: Patient has no known allergies.    Review of Systems  All other systems reviewed and are negative.   Updated Vital Signs BP (!) 148/82 (BP Location: Right Arm)   Pulse 82   Temp 98 F (36.7 C) (Oral)   Resp 14   Ht 6' 2 (1.88 m)   Wt 99.8 kg   SpO2 100%   BMI 28.25 kg/m   Physical Exam Vitals and nursing note reviewed.  Constitutional:      Appearance: Normal appearance.  HENT:     Head: Normocephalic and atraumatic.     Right Ear: External ear normal.     Left Ear: External ear normal.     Nose: Nose normal.     Mouth/Throat:     Mouth: Mucous membranes are moist.     Pharynx: Oropharynx is clear.  Eyes:     Extraocular Movements: Extraocular movements intact.     Conjunctiva/sclera: Conjunctivae normal.     Pupils: Pupils are equal, round, and reactive to light.  Cardiovascular:     Rate and Rhythm: Normal rate and regular rhythm.     Pulses: Normal pulses.     Heart sounds: Normal heart sounds.  Pulmonary:     Effort: Pulmonary effort is normal.     Breath sounds: Normal breath sounds.   Abdominal:     General: Abdomen is flat. Bowel sounds are normal.     Palpations: Abdomen is soft.  Musculoskeletal:        General: Normal range of motion.     Cervical back: Normal range of motion and neck supple.  Skin:    General: Skin is warm.     Capillary Refill: Capillary refill takes less than 2 seconds.  Neurological:     General: No focal deficit present.     Mental Status: He is alert and oriented to person, place, and time.  Psychiatric:        Mood and Affect: Mood normal.        Behavior: Behavior normal.     (all labs ordered are listed, but only abnormal results are displayed) Labs Reviewed - No data to display  EKG: None  Radiology: No results found.   Procedures   Medications Ordered in the ED - No data to display  Medical Decision Making  There is nothing new about patient for today's visit.  He does not want to be out in the cold.  Unfortunately, the ED can't be used as a shelter.   I gave him shelter info again.     Final diagnoses:  Homeless  Malingering    ED Discharge Orders     None          Dean Clarity, MD 06/08/24 2039

## 2024-06-08 NOTE — ED Provider Notes (Signed)
 [place] EMERGENCY DEPARTMENT AT [facility] Provider Note   CSN: [id number] Arrival date & time: 06/08/24  2254     Patient presents with: Paranoid and Hallucinations   Aaron Rubio is a 43 y.o. male.    44 y/o male with PMHx of HTN, cocaine use, paranoid schizophrenia, substance-induced mood disorder, depression, homelessness and malingering presents to the ED for visual hallucinations and paranoia.  Reported in triage that people are trying to kill him, though during my bedside assessment remarks that he is Indian and people are trying to kill his family.  States that he is not compliant with his psychiatric medications.  He wants to speak to psychiatry.  No suicidal or homicidal ideations expressed.      Prior to Admission medications   Medication Sig Start Date End Date Taking? Authorizing Provider  lisinopril  (ZESTRIL ) 5 MG tablet Take 5 mg by mouth daily. Patient not taking: Reported on 05/02/2024    [provider]  risperiDONE  (RISPERDAL ) 1 MG tablet Take 1 tablet (1 mg total) by mouth 2 (two) times daily. 06/09/24   Keith Sor, PA-C    Allergies: Patient has no known allergies.    Review of Systems Ten systems reviewed and are negative for acute change, except as noted in the HPI.    Updated Vital Signs BP (!) 157/101 (BP Location: Right Arm)   Pulse 70   Temp 98 F (36.7 C) (Oral)   Resp 18   SpO2 100%   Physical Exam Vitals and nursing note reviewed.  Constitutional:      General: He is not in acute distress.    Appearance: He is well-developed. He is not diaphoretic.     Comments: Patient appears at known baseline. Nontoxic.  HENT:     Head: Normocephalic and atraumatic.  Eyes:     General: No scleral icterus.    Conjunctiva/sclera: Conjunctivae normal.  Pulmonary:     Effort: Pulmonary effort is normal. No respiratory distress.  Musculoskeletal:        General: Normal range of motion.     Cervical back: Normal range  of motion.  Skin:    General: Skin is warm and dry.     Coloration: Skin is not pale.     Findings: No erythema or rash.  Neurological:     Mental Status: He is alert and oriented to person, place, and time.  Psychiatric:        Mood and Affect: Affect is blunt.        Speech: Speech is tangential.        Behavior: Behavior is agitated. Behavior is not aggressive.     Comments: Not reacting to internal stimuli.     (all labs ordered are listed, but only abnormal results are displayed) Labs Reviewed - No data to display  EKG: None  Radiology: No results found.   Procedures   Medications Ordered in the ED  risperiDONE  (RISPERDAL ) tablet 1 mg (has no administration in time range)    Clinical Course as of 06/09/24 0030  Mon Jun 09, 2024  0027 Patient disposition changed to discharge. RN went to discharge patient and he is requesting his medication. Now reporting to be suicidal. Risperdal  ordered.   Patient with no suicidal plan. On chart review, this is his 4th visit to the ED in the past 36 hours. He was also seen at [facility] for psychiatric assessment and passive SI on 06/06/24. He expresses no suicidal plan. His behaviors are  in the act of self preservation; to avoid being subjected to the outdoors and cold temperatures. Patient is not behaving in a way that is clinically c/w being actively suicidal. Feel he remains appropriate for discharge and outpatient f/u with [facility].  [KH]    Clinical Course User Index [KH] Keith Sor, PA-C                                 Medical Decision Making  This patient presents to the ED for concern of hallucinations, this involves an extensive number of treatment options, and is a complaint that carries with it a high risk of complications and morbidity.  The differential diagnosis includes substance induced mood d/o vs schizophrenia vs acute psychosis.   Co morbidities that complicate the patient evaluation  HTN Polysubstance abuse Paranoid  schizophrenia   Additional history obtained:  Additional history obtained from EMS personnel External records from outside source obtained and reviewed including prior discharge summaries   Cardiac Monitoring:  The patient was maintained on a cardiac monitor.  I personally viewed and interpreted the cardiac monitored which showed an underlying rhythm of: NSR   Medicines ordered and prescription drug management:  I ordered medication including Risperdal  for hallucinations/psychiatric d/o  I have reviewed the patients home medicines and have made adjustments as needed   Test Considered:  UDS   Problem List / ED Course:  As above   Reevaluation:  After the interventions noted above, I reevaluated the patient and found that they have :stayed the same   Social Determinants of Health:  Housing instability   Dispostion:  After consideration of the diagnostic results and the patients response to treatment, I feel that the patent would benefit from outpatient psychiatric f/u. Given outpatient resources and information for [facility]. Return precautions discussed and provided. Patient discharged in stable condition with no unaddressed concerns.       Final diagnoses:  Encounter for psychiatric assessment    ED Discharge Orders          Ordered    risperiDONE  (RISPERDAL ) 1 MG tablet  2 times daily        06/09/24 0002               Keith Sor, PA-C 06/09/24 0056    Raford Lenis, MD 06/09/24 [phone]

## 2024-06-09 ENCOUNTER — Other Ambulatory Visit (HOSPITAL_COMMUNITY): Payer: Self-pay

## 2024-06-09 MED ORDER — RISPERIDONE 0.5 MG PO TABS
1.0000 mg | ORAL_TABLET | Freq: Once | ORAL | Status: AC
  Administered 2024-06-09: 1 mg via ORAL
  Filled 2024-06-09: qty 2

## 2024-06-09 MED ORDER — RISPERIDONE 1 MG PO TABS
1.0000 mg | ORAL_TABLET | Freq: Two times a day (BID) | ORAL | 1 refills | Status: DC
  Filled 2024-06-09: qty 30, 15d supply, fill #0

## 2024-06-09 NOTE — ED Provider Notes (Signed)
 ------------------------------------------------------------------------------- Attestation signed by Therisa KANDICE Silvan, MD at 06/10/24 1357 I attest that I am the attending provider for this patient and have reviewed the medical decision-making and plan as part of ongoing quality measures.  I was physically present in the department and available for consultation.  This case was not discussed with me during the patient's time in the ED. Social work ENGINEER, MAINTENANCE (IT)) note reviewed. Patient cleared for discharge with resources after their assessment. -------------------------------------------------------------------------------   [facility]  ED Provider Note  Tele-Medical screening initiated and orders placed by Sammi DELENA Blanch, MD. 06/09/2024 / 1:47 PM  Difficulty to obtain history, utilized chart review. 43 y.o. male presents multiple complaints with from hallucinations, depressed, feeling cold, housing and family troubles. States living 'hear and there'. States medications were taken from (seroqual, buspar ). Patient reports came to ER so that staff here can take care of him. Reports was incarerated and moved from [facility] Per Chart Review: hx of schizophrenia, mood disorder, homelessness, multiple visits to [facility] ER (5+ this month)  VSS. Alert. No acute resp distress. Tangential statements. Denies SI, but states feeling threatened.  Requested in-house provider evaluation.  Patient seen and received a tele-medical screening examination in triage. The provider performing the medical screening exam was not located at the facility and was located remotely. Patient understands that the provider is seeing them remotely and consents to the exam. Appropriate orders have been initiated based on my brief physical exam and HPI. Patient placed in appropriate area until a treatment room becomes available for further evaluation and management by the in-house provider.   This tele-medical  screening exam was electronically signed by Sammi DELENA Blanch, MD on 06/09/2024 at 1:47 PM   Aaron Rubio 43 y.o. male DOB: December 09, 1980 MRN: [id number] History   Chief Complaint  Patient presents with   Hallucinations    Pt reports he was seen recently for hallucinations and was discharged. Pt states he came here because he knows we will take care of him.    Patient is a 43 year old male with history of schizophrenia who is here for evaluation of hallucinations.  Patient has multiple ED visits for the same.  Patient will not answer questions appropriately but will then be alert and oriented at times.  He does have a history of noncompliance with medications as well as recommendations.        Past Medical History[1]  Past Surgical History[2]  Social History   Substance and Sexual Activity  Alcohol Use Yes   Tobacco Use History[3] E-Cigarettes   Vaping Use     Start Date     Cartridges/Day     Quit Date     Social History   Substance and Sexual Activity  Drug Use Not Currently         Allergies[4]  Discharge Medication List as of 06/09/2024  5:23 PM     CONTINUE these medications which have NOT CHANGED   Details  ibuprofen  (ADVIL ,MOTRIN ) 800 mg tablet Take one tablet (800 mg dose) by mouth 3 (three) times a day., Starting Mon 05/08/2022, Print        Primary Survey   Exposure   No visible chest trauma.  No visible abdominal trauma.  No visible trauma noted on back exam.      Review of Systems   Review of Systems  Constitutional:  Negative for chills and fever.  HENT:  Negative for ear pain and sore throat.   Eyes:  Negative for pain  and visual disturbance.  Respiratory:  Negative for cough and shortness of breath.   Cardiovascular:  Negative for chest pain and palpitations.  Gastrointestinal:  Negative for abdominal pain and vomiting.  Genitourinary:  Negative for dysuria and hematuria.  Musculoskeletal:  Negative for arthralgias and back pain.   Skin:  Negative for color change and rash.  Neurological:  Negative for seizures and syncope.  Psychiatric/Behavioral:  Positive for behavioral problems and hallucinations.   All other systems reviewed and are negative.   Physical Exam   ED Triage Vitals [06/09/24 1256]  BP (!) 146/93  Heart Rate 76  Resp 18  SpO2 100 %  Temp 97.6 F (36.4 C)    Physical Exam  Nursing note and vitals reviewed. Constitutional: He appears well-developed and well-nourished. He is in good hygiene. He has good hygiene. He does not appear distressed and does not appear ill. Not diaphoretic. HENT:  Head: Normocephalic and atraumatic.  Mouth/Throat: Voice normal.  Eyes: Conjunctivae are normal.  Neck: Normal range of motion and voice normal.  Cardiovascular: Normal rate and regular rhythm.  Pulmonary/Chest: Respiratory effort normal. No visible chest trauma.  Abdominal: No visible abdominal trauma.  Musculoskeletal: Normal range of motion. No visible trauma noted on back exam.     Cervical back: Normal range of motion.   Neurological: He is alert and oriented to person, place, and time.  Skin: Skin is warm. Not diaphoretic. Skin is dry.  Psychiatric: He has a normal mood and affect. Mumbling speech. Speech is tangential. His behavior is normal. He is actively hallucinating.     ED Course   Lab results:   CBC AND DIFFERENTIAL - Abnormal      Result Value   WBC 5.9     RBC 5.17     HGB 13.5 (*)    HCT 43.5     MCV 84.1     MCH 26.1     MCHC 31.0 (*)    Plt Ct 210     RDW SD 42.4     MPV 9.0 (*)    NRBC% 0.0     Absolute NRBC Count 0.00     NEUTROPHIL % 55.9     LYMPHOCYTE % 29.9     MONOCYTE % 9.2     Eosinophil % 3.8     BASOPHIL % 1.0     IG% 0.2     ABSOLUTE NEUTROPHIL COUNT 3.28     ABSOLUTE LYMPHOCYTE COUNT 1.75     Absolute Monocyte Count 0.54     Absolute Eosinophil Count 0.22     Absolute Basophil Count 0.06     Absolute Immature Granulocyte Count 0.01     COMPREHENSIVE METABOLIC PANEL - Abnormal   Na 140     Potassium 4.0     Cl 103     CO2 31     AGAP 6 (*)    Glucose 88     BUN 15     Creatinine 1.18     Ca 9.5     ALK PHOS 85     T Bili 0.4     Total Protein 6.6     Alb 4.2     GLOBULIN 2.4     ALBUMIN/GLOBULIN RATIO 1.8     BUN/CREAT RATIO 12.7     ALT 17     AST 27     eGFR 79     Comment: Normal GFR (glomerular filtration rate) > 60 mL/min/1.73 meters squared, < 60  may include impaired kidney function. Calculation based on the Chronic Kidney Disease Epidemiology Collaboration (CK-EPI)equation refit without adjustment for race.  ETHANOL - Normal   Ethanol <10     Comment: Blood Alcohol Level is for Medical Purposes Only.  LIGHT BLUE TOP  GOLD SST    Imaging: No data to display   ECG: ECG Results   None                                                                        Pre-Sedation Procedures    Medical Decision Making The patient is currently medically stable, and appropriate for behavioral health evaluation. Presentation is more consistent with functional psychiatric disorder than secondary to organic pathology or delirium. Patient would benefit from psychiatric evaluation to lend expertise and help determine the best disposition and treatment plan.  To ensure ongoing care as patient awaits BH evaluation, the following measures have been taken: -All acute medical issues identified on ED evaluation have been addressed  -Behavioral Health ED holding orders have been placed (including CIWA/COWS orders as needed) -Home Medications will be addressed through medication reconciliation process -Dietary orders, including blood sugar control, have been placed.           Provider Communication  Discharge Medication List as of 06/09/2024  5:23 PM      Discharge Medication List as of 06/09/2024  5:23 PM      Discharge Medication List as of 06/09/2024  5:23 PM       Clinical Impression Final diagnoses:  Malingering    ED Disposition     ED Disposition  Discharge   Condition  Stable   Comment  --                   Electronically signed by:        [1] Past Medical History: Diagnosis Date   ADHD    Cocaine abuse (*)    Homelessness    Malingering    Schizophrenic disorder (*)    Substance induced mood disorder (*)   [2] History reviewed. No pertinent surgical history. [3] Social History Tobacco Use  Smoking Status Every Day   Types: Cigarettes  Smokeless Tobacco Current  [4] No Known Allergies  Ashley JONELLE Sharps, FNP 06/09/24 2003

## 2024-06-09 NOTE — Progress Notes (Signed)
 [facility] -Behavioral Health Diagnostic Evaluation Behavioral Health Crisis Access Screening Virtual-Visit was conducted with the use of interactive audio and video telecommunications systems that permits real time communication between provider and patient   Patient location at time of Tele-Consult: [facility] Provider Location at time of Tele-Consult: Remote  Patient Name:  Aaron Rubio Date of Birth:  21-May-1981  Today's Date:  June 09, 2024  Today's encounter included additional complexity due to: maladaptive communication, including repeated questioning  [place] [facility] Safety planning intervention was unable to be completed due to lack of patient participation during the visit. Total time spent on safety planning was 0 minutes.   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 male with a history of Substance use D/O, Schizophrenia Spectrum, and malingering who presented to the Patient location at time of Tele-Consult: [facility]. Means at which patient arrived to ED:: Walk/Drove Self. Patient is voluntarily and presents in ED due to Pt reports he was seen recently for hallucinations and was discharged. Pt states he came here because he knows we will take care of him.  Patient reports it's the holiday season and I'm tired and I've got relatives. Patient states that he has things to do and cannot give a clear answer as to what his concerns are. Patient is disorganized with flight of ideas throughout assessment. There are times when pt is clear and can answer specific questions. There are also times where patient is completely disorganized and  appears to be a conscious simulation/ malingering. Patient denies SI, HI, and AVH currently. Denies any mental health symptoms. Patient states he has no support system. When asking if he is homeless, pt could not give an accurate response. He did not answer appropriately when asked if he could use community resources. Clinician provided crisis resources in the AVS anyway.   Upon assessing the patient and reviewing the records available, including this encounter's CSS[facility], at this time patient was felt to be safe for discharge. Upon assessment, patient was not considered to be an imminent threat to self/others in the foreseeable future, and future attempts are not predicted nor guaranteed, felt to be a low acute risk to self and other due to mental illness. Risk of suicidality and homicidality will increase if the patient does not follow up with outpatient resources, comply with outpatient psychiatry and substance abuse treatment, and abstain from alcohol and drug use. The patient has been provided with information on how to access emergency services as well as community resources information.   Provider conducted a risk assessment and there was no indication that the patient presented, with reasonable medical certainty, an imminent and substantial risk of harm to themselves or others at time of discharge.   Reviewed a safety plan for patient although he refused to participate, this included calling 911 if thoughts of harm to self arise and/or crisis hotline number: National Suicide Prevention Lifeline: 988     Based on my assessment, the recommendation is for discharge to outpatient level of care. BH Consult reviewed with ED provider, Ashley Sharps, FNP, who was informed of the disposition plan and is in agreement with the plan. ED provider confirms patient's commitment status at present to be voluntary. Based on the [facility] Consult findings, the patient's commitment status  will be remain voluntary. Next steps will  include discharge to outpatient level of care with resources provided. Legal status/disposition recommendation was reviewed with the patient.  Disposition  ED Provider Contact Name: Ashley FABIENE Sharps, FNP MD Contact Date: 06/09/24 MD Contact Time: 1700 Disposition Recommendation: Outpatient Admitted to Medical Unit?: No Outpatient Type: MH;SA Reason Not Admitted: Not Suicidal;Not Homicidal;Not Psychotic;Able to function with available [employer].     Triage Screen  ED Triage Access Screening : The patient did not arrive under IVC or TDO.   General Information  Type of Screen: If NOT Face to Face, Skip to Disposition Section): Face to Face Means at which patient arrived to ED:: Walk/Drove Self Referral Source Name and Contact Number: [facility] ED: [phone] Release Signed: No Referral Source Contacted: Yes Release for Community Providers: No Information Provided By:: Patient Court Appointed Guardian: No What brought the patient to the ED/circumstances prompting assessment: Pt reports he was seen recently for hallucinations and was discharged. Pt states he came here because he knows we will take care of him. Presenting Problems: Substance Abuse;Psychotic Symptoms/Hallucinations;Inablity to Care for Self Duration of Presenting Problems: Other (Comment) (chronic) Outside help or community services at home: None Is there anyone that you know, or are related to, on the Behavioral Health unit?: No Have you ever been in the Service?: No  Potential Risk to Self  Suicidal Thoughts: None present Suicide Plan: None present Suicidal Intent: None present History of Suicide Attempts: No previous history Lethality of Past Self-Injurious Behavior: None present Self-harm/self-injurious behaviors: Denies Access to means/weapons to harm self: Denies  Potential Risk to Others  Current Plan: Denies  Symptoms  Sleep pattern changed: No Sleeping increased: No Sleeping decreased:  No Problems: No Use sleep aid: No  Have you lost weight recently without trying?: No How much weight have you lost?: None Have you been eating poorly because of a decreased appetite?: No Behaviors indicative of an eating disorder: No  Hopelessness/Helplessness: No Crying spells/mood swings: No Low energy/fatigue: No Concentration problems: Yes Psychomotor retardation/agitation: Yes Feelings of guilt/worthlessness: No Social withdrawal: No Recurrent thoughts of death: No Deterioration in Activities of Daily Living: Yes (due to homelessness)  Rapid pressured speech: No Increase in impulsivity: No Increase in energy: No Flight of ideas/loose association: Yes  Excessive worry: No Nervousness: No Irritability: Yes Shortness of breath: No Racing heart rate: No Sweaty/Chills/Hot flashes: No Nausea/Vomiting/Diarrhea: No Chest Pain: No  Additional Symptom Information: no other symptoms reported by patient  Psychosis  Coherency (the How): Disorganized;Flight of ideas Content (the What): Preoccupation Delusions: Grandiose Hallucinations: None  Treatments  Treatments?: Yes Treatment Date:  (multiple ER visits spanning over many years within [place] as well as [place]; no actual IP psychiatric treatments in [place]) Treatment Provider/Location: multiple ER visits spanning over many years within [place] as well as [place]; no actual IP psychiatric treatments in [place] Treatment Type:  (multiple ER visits spanning over many years within [place] as well as [place]; no actual IP psychiatric treatments in [place]) Additional Treatment?: No Did you follow up with your aftercare appointment?: No Reason you did not followup: pt states there are things that come before Did you take your medication as prescribed?: No Reason you did not take your Medication: states EMS took his [facility] Medicaid card and he wants to know why  Substance Use  Substance use in past 12 months?: Yes Drug Screen: Other (comment) (not available at time  of assessment) History of Substance Use/Abuse:: Yes Marijuana Use?: Patient Reports Marijuana -  Amount/Frequency: refused to provide details, 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, crack cocaine, and prostitution addict Marijuana - Route of use:  (refused to provide details, 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, crack cocaine, and prostitution addict) Marijuana -  Age of First Use:  (unknown) Marijuana -  Duration of Current Use: unknown Marijuana -  Last Used: PTA Marijuana -  Longest Period Not Used: unknown Marijuana - How is it being acquired?:  (unknown) Cocaine/Crack Use?: Patient Reports Cocaine/Crack - Amount/Frequency: refused to provide details, 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, crack cocaine, and prostitution addict Cocaine/Crack - Route of use:  (unknown) Cocaine/Crack -  Age of First Use:  (unknown) Cocaine/Crack -  Duration of Current Use: unknown Cocaine/Crack -  Last Used: PTA Cocaine/Crack -  Longest Period Not Used: unknown Cocaine/Crack - How is it being acquired?:  (unknown) Opiates/Analgesics Use?: No Amphetamine Use?: No Sedative/Hypnotic Use?: No Club Drugs (Ecstasy) Use?: No Hallucinogen Use?: No Inhalant Use?: No Tobacco/Nicotine  Use?: No Other Drug Use?: No Withdrawal Potential: Denies Any Problems Related to Substance Use/Abuse: Denies Any Other non-substance addictive behaviors:: refused to provide details, 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, crack cocaine, and prostitution addict  Opiod Use Disorder    Clinical Opiate Withdrawal Scale Heart Rate: 65  Alcohol Use  Alcohol abuse in past 12 months?: No History of Alcohol Use/Abuse:: Patient Denies any history or Current Use   Functioning  Dressing: Independent Bathing: Independent Toileting: Independent Feeding: Independent Hearing - Right Ear: Functional Hearing - Left Ear: Functional Vision - Right Eye: Functional Vision - Left  Eye: Functional Walks in Home: Independent Patient Fall Risk Level: Low/medium Date of last yearly  physical:: unknown Possible barriers to participate in Treatment/Programming?: Yes Describe barrier to Treatment/Programming: longstanding hx of non-compliance with MH recommendations and medications per chart review Patient admitted from: Homeless/No Shelter Able to return to Current Living Arrangements?:  (unknown) Support System:: denies having any support system Are there children in the home under the age of 7?: No What is your job situation right now?: Unemployed/Not Seeking Work Problems at work?: No Financial concerns: Medications;Housing;Food Healthy coping skills: Other: (refused to answer) Recreational/Leisure activities: refused to answer Religious/Spiritual orientation: refused to answer Cultural Preferences: refused to answer  Strengths/Limitations  Strength 1: resourceful Strength 2: independent with ADLs    BH History  History of Abuse?: No Trauma: denies Neglect: denies Exploitation: denies Bereavement: denies  Legal Issues  Legal: Other (Comment) (was [facility] per pt's report, would not provide details) Engineer, Drilling?: No  Child/Adolescent Assessment     Collateral Contacts  Collateral Contact Info: Pt Unable to [employer] Assured status of living arrangements after discharge to be:: Homeless Shelter Was primary reason for hospitalization due to conflict in the home/living environment?: Yes Will the Patient return to the same environment?: Yes  Mental Status  General Appearance: poor hygiene;disheveled Motor Activity: agitated Speech: rambling Exhibited Behavior: disorganized Affect Range /Display: poor eye contact;flat Mood Range /Display: Poor Eye Contact;Flat Affect/Mood Display: Incongruent Mood: irritable Thought Process: other (comment) Thought Content: Disorganized;Flight of Ideas Insight: limited Orientation To:: Person (Yes);Place (Yes);Situation (Yes);Date (Yes)      Electronically signed: Thersia Eagles, LCSW 06/09/2024 / 5:37 PM

## 2024-06-09 NOTE — ED Notes (Signed)
 Pt talking with BH via Zoom at this time

## 2024-06-09 NOTE — Discharge Instructions (Addendum)
 We recommend restarting Risperdal  as previously prescribed to you.  We have sent a refill of this prescription to the [facility] outpatient pharmacy.  This may help with your psychiatric complaints; however, if you require additional evaluation we recommend presenting as a walk-in to the Behavioral Health Urgent Care. You may return to the ED for any other new or concerning symptoms.

## 2024-06-12 ENCOUNTER — Other Ambulatory Visit (HOSPITAL_COMMUNITY): Payer: Self-pay

## 2024-06-12 ENCOUNTER — Ambulatory Visit (HOSPITAL_COMMUNITY)
Admission: EM | Admit: 2024-06-12 | Discharge: 2024-06-12 | Disposition: A | Discharge status: Home / Self Care | Attending: Nurse Practitioner | Admitting: Nurse Practitioner

## 2024-06-12 DIAGNOSIS — Z5900 Homelessness unspecified: Secondary | ICD-10-CM

## 2024-06-12 DIAGNOSIS — Z79899 Other long term (current) drug therapy: Secondary | ICD-10-CM | POA: Diagnosis not present

## 2024-06-12 DIAGNOSIS — I444 Left anterior fascicular block: Secondary | ICD-10-CM | POA: Insufficient documentation

## 2024-06-12 DIAGNOSIS — F1910 Other psychoactive substance abuse, uncomplicated: Secondary | ICD-10-CM | POA: Diagnosis not present

## 2024-06-12 DIAGNOSIS — Z765 Malingerer [conscious simulation]: Secondary | ICD-10-CM | POA: Diagnosis not present

## 2024-06-12 DIAGNOSIS — R45851 Suicidal ideations: Secondary | ICD-10-CM

## 2024-06-12 DIAGNOSIS — Z5901 Sheltered homelessness: Secondary | ICD-10-CM | POA: Insufficient documentation

## 2024-06-12 DIAGNOSIS — F1210 Cannabis abuse, uncomplicated: Secondary | ICD-10-CM | POA: Diagnosis not present

## 2024-06-12 DIAGNOSIS — F1410 Cocaine abuse, uncomplicated: Secondary | ICD-10-CM | POA: Diagnosis not present

## 2024-06-12 DIAGNOSIS — Z91148 Patient's other noncompliance with medication regimen for other reason: Secondary | ICD-10-CM | POA: Diagnosis not present

## 2024-06-12 DIAGNOSIS — Z634 Disappearance and death of family member: Secondary | ICD-10-CM | POA: Insufficient documentation

## 2024-06-12 DIAGNOSIS — F259 Schizoaffective disorder, unspecified: Secondary | ICD-10-CM | POA: Diagnosis not present

## 2024-06-12 DIAGNOSIS — F17200 Nicotine dependence, unspecified, uncomplicated: Secondary | ICD-10-CM | POA: Insufficient documentation

## 2024-06-12 DIAGNOSIS — F1590 Other stimulant use, unspecified, uncomplicated: Secondary | ICD-10-CM | POA: Diagnosis not present

## 2024-06-12 DIAGNOSIS — Z91128 Patient's intentional underdosing of medication regimen for other reason: Secondary | ICD-10-CM | POA: Insufficient documentation

## 2024-06-12 LAB — COMPREHENSIVE METABOLIC PANEL WITH GFR
ALT: 27 U/L (ref 0–44)
AST: 49 U/L — ABNORMAL HIGH (ref 15–41)
Albumin: 4.6 g/dL (ref 3.5–5.0)
Alkaline Phosphatase: 78 U/L (ref 38–126)
Anion gap: 11 (ref 5–15)
BUN: 13 mg/dL (ref 6–20)
CO2: 25 mmol/L (ref 22–32)
Calcium: 9.7 mg/dL (ref 8.9–10.3)
Chloride: 97 mmol/L — ABNORMAL LOW (ref 98–111)
Creatinine, Ser: 1.26 mg/dL — ABNORMAL HIGH (ref 0.61–1.24)
GFR, Estimated: >60 mL/min (ref >60)
Glucose, Bld: 73 mg/dL (ref 70–99)
Potassium: 4.5 mmol/L (ref 3.5–5.1)
Sodium: 133 mmol/L — ABNORMAL LOW (ref 135–145)
Total Bilirubin: 1.5 mg/dL — ABNORMAL HIGH (ref 0.0–1.2)
Total Protein: 7.5 g/dL (ref 6.5–8.1)

## 2024-06-12 LAB — POCT URINE DRUG SCREEN - MANUAL ENTRY (I-SCREEN)
POC Amphetamine UR: POSITIVE — AB
POC Buprenorphine (BUP): NOT DETECTED
POC Cocaine UR: NOT DETECTED
POC Marijuana UR: POSITIVE — AB
POC Methadone UR: NOT DETECTED
POC Methamphetamine UR: POSITIVE — AB
POC Morphine: NOT DETECTED
POC Oxazepam (BZO): NOT DETECTED
POC Oxycodone UR: NOT DETECTED
POC Secobarbital (BAR): NOT DETECTED

## 2024-06-12 LAB — CBC WITH DIFFERENTIAL/PLATELET
Abs Immature Granulocytes: 0.04 K/uL (ref 0.00–0.07)
Basophils Absolute: 0 K/uL (ref 0.0–0.1)
Basophils Relative: 0 %
Eosinophils Absolute: 0 K/uL (ref 0.0–0.5)
Eosinophils Relative: 0 %
HCT: 45.5 % (ref 39.0–52.0)
Hemoglobin: 14.8 g/dL (ref 13.0–17.0)
Immature Granulocytes: 0 %
Lymphocytes Relative: 9 %
Lymphs Abs: 0.9 K/uL (ref 0.7–4.0)
MCH: 26.3 pg (ref 26.0–34.0)
MCHC: 32.5 g/dL (ref 30.0–36.0)
MCV: 80.8 fL (ref 80.0–100.0)
Monocytes Absolute: 0.9 K/uL (ref 0.1–1.0)
Monocytes Relative: 9 %
Neutro Abs: 8.1 K/uL — ABNORMAL HIGH (ref 1.7–7.7)
Neutrophils Relative %: 82 %
Platelets: 289 K/uL (ref 150–400)
RBC: 5.63 MIL/uL (ref 4.22–5.81)
RDW: 14.1 % (ref 11.5–15.5)
WBC: 10 K/uL (ref 4.0–10.5)
nRBC: 0 % (ref 0.0–0.2)

## 2024-06-12 LAB — ETHANOL: Alcohol, Ethyl (B): <15 mg/dL (ref <15)

## 2024-06-12 MED ORDER — NAPROXEN 500 MG PO TABS: 500.0000 mg | ORAL_TABLET | Freq: Two times a day (BID) | ORAL | Status: DC | PRN

## 2024-06-12 MED ORDER — HYDROXYZINE HCL 25 MG PO TABS: 25.0000 mg | ORAL_TABLET | Freq: Four times a day (QID) | ORAL | Status: DC | PRN

## 2024-06-12 MED ORDER — DIPHENHYDRAMINE HCL 50 MG/ML IJ SOLN: 50.0000 mg | Freq: Three times a day (TID) | INTRAMUSCULAR | Status: DC | PRN

## 2024-06-12 MED ORDER — RISPERIDONE 1 MG PO TABS: 1.0000 mg | ORAL_TABLET | Freq: Every day | ORAL | 0 refills | Status: DC

## 2024-06-12 MED ORDER — LORAZEPAM 2 MG/ML IJ SOLN: 2.0000 mg | Freq: Three times a day (TID) | INTRAMUSCULAR | Status: DC | PRN

## 2024-06-12 MED ORDER — MAGNESIUM HYDROXIDE 400 MG/5ML PO SUSP: 30.0000 mL | Freq: Every day | ORAL | Status: DC | PRN

## 2024-06-12 MED ORDER — HALOPERIDOL 5 MG PO TABS: 5.0000 mg | ORAL_TABLET | Freq: Three times a day (TID) | ORAL | Status: DC | PRN

## 2024-06-12 MED ORDER — DICYCLOMINE HCL 20 MG PO TABS: 20.0000 mg | ORAL_TABLET | Freq: Four times a day (QID) | ORAL | Status: DC | PRN

## 2024-06-12 MED ORDER — CLONIDINE HCL 0.1 MG PO TABS
0.1000 mg | ORAL_TABLET | Freq: Four times a day (QID) | ORAL | Status: DC
  Administered 2024-06-12: 0.1 mg via ORAL
  Filled 2024-06-12: qty 1

## 2024-06-12 MED ORDER — METHOCARBAMOL 500 MG PO TABS: 500.0000 mg | ORAL_TABLET | Freq: Three times a day (TID) | ORAL | Status: DC | PRN

## 2024-06-12 MED ORDER — RISPERIDONE 1 MG PO TABS
1.0000 mg | ORAL_TABLET | Freq: Every day | ORAL | Status: DC
  Administered 2024-06-12: 07:00:00 1 mg via ORAL
  Filled 2024-06-12: qty 1

## 2024-06-12 MED ORDER — CLONIDINE HCL 0.1 MG PO TABS: 0.1000 mg | ORAL_TABLET | ORAL | Status: DC

## 2024-06-12 MED ORDER — HALOPERIDOL LACTATE 5 MG/ML IJ SOLN: 10.0000 mg | Freq: Three times a day (TID) | INTRAMUSCULAR | Status: DC | PRN

## 2024-06-12 MED ORDER — ALUM & MAG HYDROXIDE-SIMETH 200-200-20 MG/5ML PO SUSP: 30.0000 mL | ORAL | Status: DC | PRN

## 2024-06-12 MED ORDER — LOPERAMIDE HCL 2 MG PO CAPS: 2.0000 mg | ORAL_CAPSULE | ORAL | Status: DC | PRN

## 2024-06-12 MED ORDER — ACETAMINOPHEN 325 MG PO TABS: 650.0000 mg | ORAL_TABLET | Freq: Four times a day (QID) | ORAL | Status: DC | PRN

## 2024-06-12 MED ORDER — ONDANSETRON 4 MG PO TBDP: 4.0000 mg | ORAL_TABLET | Freq: Four times a day (QID) | ORAL | Status: DC | PRN

## 2024-06-12 MED ORDER — DIPHENHYDRAMINE HCL 50 MG PO CAPS: 50.0000 mg | ORAL_CAPSULE | Freq: Three times a day (TID) | ORAL | Status: DC | PRN

## 2024-06-12 MED ORDER — HALOPERIDOL LACTATE 5 MG/ML IJ SOLN: 5.0000 mg | Freq: Three times a day (TID) | INTRAMUSCULAR | Status: DC | PRN

## 2024-06-12 MED ORDER — CLONIDINE HCL 0.1 MG PO TABS: 0.1000 mg | ORAL_TABLET | Freq: Every day | ORAL | Status: DC
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
## 2024-06-12 NOTE — ED Provider Notes (Signed)
 [facility] Continuous Assessment Admission H&P  Date: 06/12/2024 Patient Name: Aaron Rubio MRN: [id number] Chief Complaint: I need my medications.  Diagnoses:  Final diagnoses:  Suicidal ideations  Hallucinations, visual  Homelessness  Non compliance w medication regimen  Polysubstance abuse (HCC)  Schizoaffective disorder, unspecified type (HCC)    HPI:  15 M. [name] is a 43 y.o. male with psychiatric history of schizophrenia, homelessness, MDD, malingering, substance-induced mood disorder, schizoaffective disorder, hallucinations and polysubstance abuse-cannabis, cocaine, and methamphetamine, who presented voluntarily to [facility] via [facility], with complaints of homelessness, SI, no plan-,visual hallucinations and medication noncompliance.   Patient was seen face-to-face by this provider and chart reviewed. Per chart review, patient has a history of multiple ED/urgent care visits due to homelessness and seeking hospitalization for secondary gain of shelter. Patient has had 4 inpatient admissions and a total of 60 ED/UC visits within 6 months.  Tonight, patient reports he was found by [employer] wandering right down the street, .......I was out of my mind. He endorses visual hallucinations of seeing visions, out of place distortions due to not taking his medications for the past 2 to 3 months and states  I need my medications.   Patient reports he is prescribed Risperdal , Seroquel , Zyprexa  at the ED.  He is not established with an outpatient psychiatrist or therapist.  Patient reports he stopped taking his medications because I didn't think I would need them, ...SABRASABRAapparently I did,...... my mom dead, grandma passed away....... .  Patient then engages in tangential speech about religion and God.  Patient reports feeling suicidal, no plan and states I've been dealing with this ever since I was younger, and in the past, I had plans like jumping off a bridge.   Patient endorses  ongoing polysubstance abuse since age 34, and states I have been drinking alcohol, smoking weed, and smoking ice-you know you can chew it too. Patient reports he last used ice 3-4 days ago, last drank 4 beers yesterday afternoon and smoked marijuana yesterday.   Patient endorses homelessness and states I've been homeless since I [facility], I'm originally from [place] .  Patient then engages in tangential speech centered on religion, God, doing the right thing and saving himself........... his thought process is scattered with pressured speech.  He appears under the influence of unknown substance/s.  On evaluation, patient is alert, oriented to name, and cooperative. Speech is clear, coherent, but scattered and tangential at times. Pt appears disheveled. Eye contact is intense. Mood is anxious, affect is congruent with mood. Thought process is linear and circumstantial and thought content is scattered and tangential. Pt endorses SI, no plan. Denies HI/AH, endorses VH. There is no objective indication that the patient is responding to internal stimuli. No delusions elicited during this assessment.     Discussed recommendation for admission to the continuous observation unit for safety monitoring, stabilization and reeval in the a.m. for SI/HI/AVH or paranoia.   Patient is provided with opportunity for questions.  He verbalized understanding and is in agreement.  Total Time spent with patient: 30 minutes  Musculoskeletal  Strength & Muscle Tone: within normal limits Gait & Station: normal Patient leans: N/A  Psychiatric Specialty Exam  Presentation General Appearance:  Disheveled  Eye Contact: Other (comment) (intense)  Speech: Clear and Coherent; Pressured  Speech Volume: Normal  Handedness: Right   Mood and Affect  Mood: Anxious  Affect: Congruent   Thought Process  Thought Processes: Linear  Descriptions  of  Associations:Circumstantial  Orientation:Partial  Thought Content:Scattered; Tangential  Diagnosis of Schizophrenia or Schizoaffective disorder in past: Yes  Duration of Psychotic Symptoms: Greater than six months  Hallucinations:Hallucinations: Visual Description of Visual Hallucinations: chronic visual hallucinations-Pt reports seeing out of place distortions.  Ideas of Reference:None  Suicidal Thoughts:Suicidal Thoughts: Yes, Passive SI Passive Intent and/or Plan: Without Plan  Homicidal Thoughts:Homicidal Thoughts: No   Sensorium  Memory: Immediate Fair  Judgment: Poor  Insight: Poor   Executive Functions  Concentration: Fair  Attention Span: Fair  Recall: [employer] of Knowledge: Fair  Language: Fair   Psychomotor Activity  Psychomotor Activity: Psychomotor Activity: Normal   Assets  Assets: Manufacturing Systems Engineer; Desire for Improvement   Sleep  Sleep: Sleep: Poor   Nutritional Assessment (For OBS and FBC admissions only) Has the patient had a weight loss or gain of 10 pounds or more in the last 3 months?: No Has the patient had a decrease in food intake/or appetite?: No Does the patient have dental problems?: No Does the patient have eating habits or behaviors that may be indicators of an eating disorder including binging or inducing vomiting?: No Has the patient recently lost weight without trying?: 0 Has the patient been eating poorly because of a decreased appetite?: 0 Malnutrition Screening Tool Score: 0    Physical Exam Constitutional:      General: He is not in acute distress.    Appearance: He is not diaphoretic.  HENT:     Nose: No congestion.  Pulmonary:     Effort: No respiratory distress.  Chest:     Chest wall: No tenderness.  Neurological:     Mental Status: He is alert. Mental status is at baseline.  Psychiatric:        Attention and Perception: He perceives visual hallucinations.        Mood and Affect: Mood is  anxious. Affect is blunt.        Speech: Speech is rapid and pressured and tangential.        Behavior: Behavior is cooperative.        Thought Content: Thought content includes suicidal ideation.    Review of Systems  Constitutional:  Negative for chills, diaphoresis and fever.  HENT:  Negative for congestion.   Eyes:  Negative for discharge.  Respiratory:  Negative for cough, shortness of breath and wheezing.   Cardiovascular:  Negative for chest pain and palpitations.  Gastrointestinal:  Negative for diarrhea, nausea and vomiting.  Neurological:  Negative for dizziness, seizures and headaches.  Psychiatric/Behavioral:  Positive for hallucinations, substance abuse and suicidal ideas.     Past Psychiatric History: See H & P   Is the patient at risk to self? Yes  Has the patient been a risk to self in the past 6 months? Yes .    Has the patient been a risk to self within the distant past? Yes   Is the patient a risk to others? No   Has the patient been a risk to others in the past 6 months? No   Has the patient been a risk to others within the distant past? No   Past Medical History: See Chart  Family History: N/A  Social History: N/A  Last Labs:  Admission on 06/07/2024, Discharged on 06/07/2024  Component Date Value Ref Range Status   Lipase 06/07/2024 28  11 - 51 U/L Final   Performed at [facility], [address], [place], [place] [address]  Sodium 06/07/2024 138  135 - 145 mmol/L Final   Potassium 06/07/2024 4.1  3.5 - 5.1 mmol/L Final   Chloride 06/07/2024 107  98 - 111 mmol/L Final   CO2 06/07/2024 27  22 - 32 mmol/L Final   Glucose, Bld 06/07/2024 66 (L)  70 - 99 mg/dL Final   Glucose reference range applies only to samples taken after fasting for at least 8 hours.   BUN 06/07/2024 15  6 - 20 mg/dL Final   Creatinine, Ser 06/07/2024 1.39 (H)  0.61 - 1.24 mg/dL Final   Calcium 87/93/7974 9.0  8.9 - 10.3 mg/dL Final   Total Protein 87/93/7974 6.2 (L)  6.5  - 8.1 g/dL Final   Albumin 87/93/7974 3.6  3.5 - 5.0 g/dL Final   AST 87/93/7974 26  15 - 41 U/L Final   ALT 06/07/2024 17  0 - 44 U/L Final   Alkaline Phosphatase 06/07/2024 62  38 - 126 U/L Final   Total Bilirubin 06/07/2024 0.2  0.0 - 1.2 mg/dL Final   GFR, Estimated 06/07/2024 >60  >60 mL/min Final   Comment: (NOTE) Calculated using the CKD-EPI Creatinine Equation (2021)    Anion gap 06/07/2024 4 (L)  5 - 15 Final   Performed at [facility], [address], [place], [place] [address]   WBC 06/07/2024 5.9  4.0 - 10.5 K/uL Final   RBC 06/07/2024 5.25  4.22 - 5.81 MIL/uL Final   Hemoglobin 06/07/2024 13.7  13.0 - 17.0 g/dL Final   HCT 87/93/7974 43.0  39.0 - 52.0 % Final   MCV 06/07/2024 81.9  80.0 - 100.0 fL Final   MCH 06/07/2024 26.1  26.0 - 34.0 pg Final   MCHC 06/07/2024 31.9  30.0 - 36.0 g/dL Final   RDW 87/93/7974 13.8  11.5 - 15.5 % Final   Platelets 06/07/2024 220  150 - 400 K/uL Final   nRBC 06/07/2024 0.0  0.0 - 0.2 % Final   Performed at [facility], [address] [place], [place] [address]   Color, Urine 06/07/2024 YELLOW  YELLOW Final   APPearance 06/07/2024 CLOUDY (A)  CLEAR Final   Specific Gravity, Urine 06/07/2024 1.015  1.005 - 1.030 Final   pH 06/07/2024 8.0  5.0 - 8.0 Final   Glucose, UA 06/07/2024 NEGATIVE  NEGATIVE mg/dL Final   Hgb urine dipstick 06/07/2024 NEGATIVE  NEGATIVE Final   Bilirubin Urine 06/07/2024 NEGATIVE  NEGATIVE Final   Ketones, ur 06/07/2024 NEGATIVE  NEGATIVE mg/dL Final   Protein, ur 87/93/7974 NEGATIVE  NEGATIVE mg/dL Final   Nitrite 87/93/7974 NEGATIVE  NEGATIVE Final   Leukocytes,Ua 06/07/2024 NEGATIVE  NEGATIVE Final   Performed at [facility], [address], [place], [place] [address]  Admission on 06/02/2024, Discharged on 06/03/2024  Component Date Value Ref Range Status   Sodium 06/02/2024 141  135 - 145 mmol/L Final   Potassium 06/02/2024 3.6  3.5 - 5.1 mmol/L Final   Chloride 06/02/2024 107  98 - 111  mmol/L Final   CO2 06/02/2024 27  22 - 32 mmol/L Final   Glucose, Bld 06/02/2024 82  70 - 99 mg/dL Final   Glucose reference range applies only to samples taken after fasting for at least 8 hours.   BUN 06/02/2024 14  6 - 20 mg/dL Final   Creatinine, Ser 06/02/2024 1.16  0.61 - 1.24 mg/dL Final   Calcium 87/98/7974 8.7 (L)  8.9 - 10.3 mg/dL Final   Total Protein 87/98/7974  6.3 (L)  6.5 - 8.1 g/dL Final   Albumin 87/98/7974 3.7  3.5 - 5.0 g/dL Final   AST 87/98/7974 26  15 - 41 U/L Final   ALT 06/02/2024 18  0 - 44 U/L Final   Alkaline Phosphatase 06/02/2024 59  38 - 126 U/L Final   Total Bilirubin 06/02/2024 0.7  0.0 - 1.2 mg/dL Final   GFR, Estimated 06/02/2024 >60  >60 mL/min Final   Comment: (NOTE) Calculated using the CKD-EPI Creatinine Equation (2021)    Anion gap 06/02/2024 7  5 - 15 Final   Performed at [facility], [address], [place], [place] [address]   Alcohol, Ethyl (B) 06/02/2024 <15  <15 mg/dL Final   Comment: (NOTE) For medical purposes only. Performed at [facility], [address] [place], [place] [address]    WBC 06/02/2024 8.1  4.0 - 10.5 K/uL Final   RBC 06/02/2024 5.22  4.22 - 5.81 MIL/uL Final   Hemoglobin 06/02/2024 13.5  13.0 - 17.0 g/dL Final   HCT 87/98/7974 43.3  39.0 - 52.0 % Final   MCV 06/02/2024 83.0  80.0 - 100.0 fL Final   MCH 06/02/2024 25.9 (L)  26.0 - 34.0 pg Final   MCHC 06/02/2024 31.2  30.0 - 36.0 g/dL Final   RDW 87/98/7974 13.8  11.5 - 15.5 % Final   Platelets 06/02/2024 220  150 - 400 K/uL Final   nRBC 06/02/2024 0.0  0.0 - 0.2 % Final   Performed at [facility], [address] [place], [place] [address]   Opiates 06/02/2024 NONE DETECTED  NONE DETECTED Final   Cocaine 06/02/2024 NONE DETECTED  NONE DETECTED Final   Benzodiazepines 06/02/2024 NONE DETECTED  NONE DETECTED Final   Amphetamines 06/02/2024 NONE DETECTED  NONE DETECTED Final   Tetrahydrocannabinol 06/02/2024 NONE DETECTED  NONE DETECTED Final    Barbiturates 06/02/2024 NONE DETECTED  NONE DETECTED Final   Comment: (NOTE) DRUG SCREEN FOR MEDICAL PURPOSES ONLY.  IF CONFIRMATION IS NEEDED FOR ANY PURPOSE, NOTIFY LAB WITHIN 5 DAYS.  LOWEST DETECTABLE LIMITS FOR URINE DRUG SCREEN Drug Class                     Cutoff (ng/mL) Amphetamine and metabolites    1000 Barbiturate and metabolites    200 Benzodiazepine                 200 Opiates and metabolites        300 Cocaine and metabolites        300 THC                            50 Performed at [facility], [address], [place], [place] [address]   Admission on 05/06/2024, Discharged on 05/06/2024  Component Date Value Ref Range Status   Sodium 05/06/2024 138  135 - 145 mmol/L Final   Potassium 05/06/2024 3.7  3.5 - 5.1 mmol/L Final   Chloride 05/06/2024 104  98 - 111 mmol/L Final   CO2 05/06/2024 25  22 - 32 mmol/L Final   Glucose, Bld 05/06/2024 89  70 - 99 mg/dL Final   Glucose reference range applies only to samples taken after fasting for at least 8 hours.   BUN 05/06/2024 12  6 - 20 mg/dL Final   Creatinine, Ser 05/06/2024 1.25 (H)  0.61 - 1.24 mg/dL Final   Calcium 88/95/7974  8.9  8.9 - 10.3 mg/dL Final   Total Protein 88/95/7974 6.3 (L)  6.5 - 8.1 g/dL Final   Albumin 88/95/7974 3.6  3.5 - 5.0 g/dL Final   AST 88/95/7974 28  15 - 41 U/L Final   ALT 05/06/2024 20  0 - 44 U/L Final   Alkaline Phosphatase 05/06/2024 54  38 - 126 U/L Final   Total Bilirubin 05/06/2024 0.6  0.0 - 1.2 mg/dL Final   GFR, Estimated 05/06/2024 >60  >60 mL/min Final   Comment: (NOTE) Calculated using the CKD-EPI Creatinine Equation (2021)    Anion gap 05/06/2024 9  5 - 15 Final   Performed at [facility], [address] [place], [place] [address]   Alcohol, Ethyl (B) 05/06/2024 <15  <15 mg/dL Final   Comment: (NOTE) For medical purposes only. Performed at [facility], [address], [place], [place] [address]    WBC 05/06/2024 9.6  4.0 - 10.5 K/uL Final    RBC 05/06/2024 5.03  4.22 - 5.81 MIL/uL Final   Hemoglobin 05/06/2024 12.9 (L)  13.0 - 17.0 g/dL Final   HCT 88/95/7974 41.0  39.0 - 52.0 % Final   MCV 05/06/2024 81.5  80.0 - 100.0 fL Final   MCH 05/06/2024 25.6 (L)  26.0 - 34.0 pg Final   MCHC 05/06/2024 31.5  30.0 - 36.0 g/dL Final   RDW 88/95/7974 13.5  11.5 - 15.5 % Final   Platelets 05/06/2024 265  150 - 400 K/uL Final   nRBC 05/06/2024 0.0  0.0 - 0.2 % Final   Performed at [facility], [address], [place], [place] [address]   Opiates 05/06/2024 NONE DETECTED  NONE DETECTED Final   Cocaine 05/06/2024 POSITIVE (A)  NONE DETECTED Final   Benzodiazepines 05/06/2024 NONE DETECTED  NONE DETECTED Final   Amphetamines 05/06/2024 NONE DETECTED  NONE DETECTED Final   Tetrahydrocannabinol 05/06/2024 NONE DETECTED  NONE DETECTED Final   Barbiturates 05/06/2024 NONE DETECTED  NONE DETECTED Final   Comment: (NOTE) DRUG SCREEN FOR MEDICAL PURPOSES ONLY.  IF CONFIRMATION IS NEEDED FOR ANY PURPOSE, NOTIFY LAB WITHIN 5 DAYS.  LOWEST DETECTABLE LIMITS FOR URINE DRUG SCREEN Drug Class                     Cutoff (ng/mL) Amphetamine and metabolites    1000 Barbiturate and metabolites    200 Benzodiazepine                 200 Opiates and metabolites        300 Cocaine and metabolites        300 THC                            50 Performed at [facility], [address], [place], [place] [address]   Admission on 05/03/2024, Discharged on 05/03/2024  Component Date Value Ref Range Status   SARS Coronavirus 2 by RT PCR 05/03/2024 NEGATIVE  NEGATIVE Final   Influenza A by PCR 05/03/2024 NEGATIVE  NEGATIVE Final   Influenza B by PCR 05/03/2024 NEGATIVE  NEGATIVE Final   Comment: (NOTE) The Xpert Xpress SARS-CoV-2/FLU/RSV plus assay is intended as an aid in the diagnosis of influenza from Nasopharyngeal swab specimens and should not be used as a sole basis for treatment. Nasal washings and aspirates are unacceptable for Xpert  Xpress SARS-CoV-2/FLU/RSV testing.  Fact Sheet for Patients: bloggercourse.com  Fact Sheet for Healthcare Providers: seriousbroker.it  This test is not yet approved or cleared by the [place]  FDA and has been authorized for detection and/or diagnosis of SARS-CoV-2 by FDA under an Emergency Use Authorization (EUA). This EUA will remain in effect (meaning this test can be used) for the duration of the COVID-19 declaration under Section 564(b)(1) of the Act, 21 U.S.C. section 360bbb-3(b)(1), unless the authorization is terminated or revoked.     Resp Syncytial Virus by PCR 05/03/2024 NEGATIVE  NEGATIVE Final   Comment: (NOTE) Fact Sheet for Patients: bloggercourse.com  Fact Sheet for Healthcare Providers: seriousbroker.it  This test is not yet approved or cleared by the [place]  FDA and has been authorized for detection and/or diagnosis of SARS-CoV-2 by FDA under an Emergency Use Authorization (EUA). This EUA will remain in effect (meaning this test can be used) for the duration of the COVID-19 declaration under Section 564(b)(1) of the Act, 21 U.S.C. section 360bbb-3(b)(1), unless the authorization is terminated or revoked.  Performed at [facility], [address], [place], [place] [address]   Admission on 05/01/2024, Discharged on 05/02/2024  Component Date Value Ref Range Status   Sodium 05/01/2024 137  135 - 145 mmol/L Final   Potassium 05/01/2024 4.0  3.5 - 5.1 mmol/L Final   Chloride 05/01/2024 103  98 - 111 mmol/L Final   CO2 05/01/2024 23  22 - 32 mmol/L Final   Glucose, Bld 05/01/2024 98  70 - 99 mg/dL Final   Glucose reference range applies only to samples taken after fasting for at least 8 hours.   BUN 05/01/2024 27 (H)  6 - 20 mg/dL Final   Creatinine, Ser 05/01/2024 1.36 (H)  0.61 - 1.24 mg/dL Final   Calcium 89/69/7974 8.8 (L)  8.9 - 10.3 mg/dL  Final   Total Protein 05/01/2024 5.8 (L)  6.5 - 8.1 g/dL Final   Albumin 89/69/7974 3.5  3.5 - 5.0 g/dL Final   AST 89/69/7974 46 (H)  15 - 41 U/L Final   ALT 05/01/2024 25  0 - 44 U/L Final   Alkaline Phosphatase 05/01/2024 59  38 - 126 U/L Final   Total Bilirubin 05/01/2024 0.7  0.0 - 1.2 mg/dL Final   GFR, Estimated 05/01/2024 >60  >60 mL/min Final   Comment: (NOTE) Calculated using the CKD-EPI Creatinine Equation (2021)    Anion gap 05/01/2024 11  5 - 15 Final   Performed at [facility], [address], [place], [place] [address]   Alcohol, Ethyl (B) 05/01/2024 <15  <15 mg/dL Final   Comment: (NOTE) For medical purposes only. Performed at [facility], [address], [place], [place] [address]    WBC 05/01/2024 10.9 (H)  4.0 - 10.5 K/uL Final   RBC 05/01/2024 5.01  4.22 - 5.81 MIL/uL Final   Hemoglobin 05/01/2024 13.0  13.0 - 17.0 g/dL Final   HCT 89/69/7974 40.9  39.0 - 52.0 % Final   MCV 05/01/2024 81.6  80.0 - 100.0 fL Final   MCH 05/01/2024 25.9 (L)  26.0 - 34.0 pg Final   MCHC 05/01/2024 31.8  30.0 - 36.0 g/dL Final   RDW 89/69/7974 13.9  11.5 - 15.5 % Final   Platelets 05/01/2024 248  150 - 400 K/uL Final   nRBC 05/01/2024 0.0  0.0 - 0.2 % Final   Neutrophils Relative % 05/01/2024 59  % Final   Neutro Abs 05/01/2024 6.5  1.7 - 7.7 K/uL Final  Lymphocytes Relative 05/01/2024 25  % Final   Lymphs Abs 05/01/2024 2.7  0.7 - 4.0 K/uL Final   Monocytes Relative 05/01/2024 11  % Final   Monocytes Absolute 05/01/2024 1.2 (H)  0.1 - 1.0 K/uL Final   Eosinophils Relative 05/01/2024 3  % Final   Eosinophils Absolute 05/01/2024 0.4  0.0 - 0.5 K/uL Final   Basophils Relative 05/01/2024 1  % Final   Basophils Absolute 05/01/2024 0.1  0.0 - 0.1 K/uL Final   Immature Granulocytes 05/01/2024 1  % Final   Abs Immature Granulocytes 05/01/2024 0.05  0.00 - 0.07 K/uL Final   Performed at [facility], [address] [place], [place] [address]  Admission on  04/22/2024, Discharged on 04/24/2024  Component Date Value Ref Range Status   Opiates 04/23/2024 NEGATIVE  NEGATIVE Final   Cocaine 04/23/2024 POSITIVE (A)  NEGATIVE Final   Benzodiazepines 04/23/2024 NEGATIVE  NEGATIVE Final   Amphetamines 04/23/2024 NEGATIVE  NEGATIVE Final   Tetrahydrocannabinol 04/23/2024 NEGATIVE  NEGATIVE Final   Barbiturates 04/23/2024 NEGATIVE  NEGATIVE Final   Methadone Scn, Ur 04/23/2024 NEGATIVE  NEGATIVE Final   Fentanyl  04/23/2024 NEGATIVE  NEGATIVE Final   Comment: (NOTE) Drug screen is for Medical Purposes only. Positive results are preliminary only. If confirmation is needed, notify lab within 5 days.  Drug Class                 Cutoff (ng/mL) Amphetamine and metabolites 1000 Barbiturate and metabolites 200 Benzodiazepine              200 Opiates and metabolites     300 Cocaine and metabolites     300 THC                         50 Fentanyl                     5 Methadone                   300  Trazodone  is metabolized in vivo to several metabolites,  including pharmacologically active m-CPP, which is excreted in the  urine.  Immunoassay screens for amphetamines and MDMA have potential  cross-reactivity with these compounds and may provide false positive  result.  Performed at [facility], [address], [place], [place] [address]    WBC 04/22/2024 13.1 (H)  4.0 - 10.5 K/uL Final   RBC 04/22/2024 5.51  4.22 - 5.81 MIL/uL Final   Hemoglobin 04/22/2024 14.2  13.0 - 17.0 g/dL Final   HCT 89/78/7974 44.8  39.0 - 52.0 % Final   MCV 04/22/2024 81.3  80.0 - 100.0 fL Final   MCH 04/22/2024 25.8 (L)  26.0 - 34.0 pg Final   MCHC 04/22/2024 31.7  30.0 - 36.0 g/dL Final   RDW 89/78/7974 13.6  11.5 - 15.5 % Final   Platelets 04/22/2024 284  150 - 400 K/uL Final   nRBC 04/22/2024 0.0  0.0 - 0.2 % Final   Neutrophils Relative % 04/22/2024 71  % Final   Neutro Abs 04/22/2024 9.3 (H)  1.7 - 7.7 K/uL Final   Lymphocytes Relative  04/22/2024 18  % Final   Lymphs Abs 04/22/2024 2.3  0.7 - 4.0 K/uL Final   Monocytes Relative 04/22/2024 10  % Final   Monocytes Absolute 04/22/2024 1.3 (H)  0.1 - 1.0 K/uL Final   Eosinophils Relative 04/22/2024 0  % Final  Eosinophils Absolute 04/22/2024 0.0  0.0 - 0.5 K/uL Final   Basophils Relative 04/22/2024 1  % Final   Basophils Absolute 04/22/2024 0.1  0.0 - 0.1 K/uL Final   Immature Granulocytes 04/22/2024 0  % Final   Abs Immature Granulocytes 04/22/2024 0.05  0.00 - 0.07 K/uL Final   Performed at [facility], [address], [place], [place] [address]   Sodium 04/22/2024 138  135 - 145 mmol/L Final   Potassium 04/22/2024 4.4  3.5 - 5.1 mmol/L Final   Chloride 04/22/2024 99  98 - 111 mmol/L Final   CO2 04/22/2024 23  22 - 32 mmol/L Final   Glucose, Bld 04/22/2024 73  70 - 99 mg/dL Final   Glucose reference range applies only to samples taken after fasting for at least 8 hours.   BUN 04/22/2024 20  6 - 20 mg/dL Final   Creatinine, Ser 04/22/2024 1.46 (H)  0.61 - 1.24 mg/dL Final   Calcium 89/78/7974 10.2  8.9 - 10.3 mg/dL Final   GFR, Estimated 04/22/2024 >60  >60 mL/min Final   Comment: (NOTE) Calculated using the CKD-EPI Creatinine Equation (2021)    Anion gap 04/22/2024 15  5 - 15 Final   Performed at [facility], [address], [place], [place] [address]   Alcohol, Ethyl (B) 04/22/2024 <15  <15 mg/dL Final   Comment: (NOTE) For medical purposes only. Performed at [facility], [address] [place], [place] [address]   Admission on 04/19/2024, Discharged on 04/21/2024  Component Date Value Ref Range Status   Sodium 04/20/2024 136  135 - 145 mmol/L Final   Potassium 04/20/2024 3.9  3.5 - 5.1 mmol/L Final   Chloride 04/20/2024 101  98 - 111 mmol/L Final   CO2 04/20/2024 26  22 - 32 mmol/L Final   Glucose, Bld 04/20/2024 86  70 - 99 mg/dL Final   Glucose reference range applies only to samples taken after  fasting for at least 8 hours.   BUN 04/20/2024 12  6 - 20 mg/dL Final   Creatinine, Ser 04/20/2024 1.26 (H)  0.61 - 1.24 mg/dL Final   Calcium 89/80/7974 8.9  8.9 - 10.3 mg/dL Final   Total Protein 89/80/7974 5.8 (L)  6.5 - 8.1 g/dL Final   Albumin 89/80/7974 3.4 (L)  3.5 - 5.0 g/dL Final   AST 89/80/7974 25  15 - 41 U/L Final   ALT 04/20/2024 19  0 - 44 U/L Final   Alkaline Phosphatase 04/20/2024 50  38 - 126 U/L Final   Total Bilirubin 04/20/2024 0.7  0.0 - 1.2 mg/dL Final   GFR, Estimated 04/20/2024 >60  >60 mL/min Final   Comment: (NOTE) Calculated using the CKD-EPI Creatinine Equation (2021)    Anion gap 04/20/2024 9  5 - 15 Final   Performed at [facility], [address] [place], [place] [address]   Alcohol, Ethyl (B) 04/20/2024 <15  <15 mg/dL Final   Comment: (NOTE) For medical purposes only. Performed at [facility], [address], [place], [place] [address]    Opiates 04/20/2024 NONE DETECTED  NONE DETECTED Final   Cocaine 04/20/2024 POSITIVE (A)  NONE DETECTED Final   Benzodiazepines 04/20/2024 NONE DETECTED  NONE DETECTED Final   Amphetamines 04/20/2024 NONE DETECTED  NONE DETECTED Final   Tetrahydrocannabinol 04/20/2024 NONE DETECTED  NONE DETECTED Final   Barbiturates 04/20/2024 NONE DETECTED  NONE DETECTED Final   Comment: (NOTE) DRUG SCREEN FOR MEDICAL  PURPOSES ONLY.  IF CONFIRMATION IS NEEDED FOR ANY PURPOSE, NOTIFY LAB WITHIN 5 DAYS.  LOWEST DETECTABLE LIMITS FOR URINE DRUG SCREEN Drug Class                     Cutoff (ng/mL) Amphetamine and metabolites    1000 Barbiturate and metabolites    200 Benzodiazepine                 200 Opiates and metabolites        300 Cocaine and metabolites        300 THC                            50 Performed at [facility], [address] [place], [place] [address]    WBC 04/20/2024 7.9  4.0 - 10.5 K/uL Final   RBC 04/20/2024 5.75  4.22 - 5.81 MIL/uL Final   Hemoglobin 04/20/2024 14.9  13.0 -  17.0 g/dL Final   HCT 89/80/7974 47.2  39.0 - 52.0 % Final   MCV 04/20/2024 82.1  80.0 - 100.0 fL Final   MCH 04/20/2024 25.9 (L)  26.0 - 34.0 pg Final   MCHC 04/20/2024 31.6  30.0 - 36.0 g/dL Final   RDW 89/80/7974 13.6  11.5 - 15.5 % Final   Platelets 04/20/2024 266  150 - 400 K/uL Final   nRBC 04/20/2024 0.0  0.0 - 0.2 % Final   Neutrophils Relative % 04/20/2024 61  % Final   Neutro Abs 04/20/2024 4.8  1.7 - 7.7 K/uL Final   Lymphocytes Relative 04/20/2024 24  % Final   Lymphs Abs 04/20/2024 1.9  0.7 - 4.0 K/uL Final   Monocytes Relative 04/20/2024 9  % Final   Monocytes Absolute 04/20/2024 0.7  0.1 - 1.0 K/uL Final   Eosinophils Relative 04/20/2024 5  % Final   Eosinophils Absolute 04/20/2024 0.4  0.0 - 0.5 K/uL Final   Basophils Relative 04/20/2024 1  % Final   Basophils Absolute 04/20/2024 0.1  0.0 - 0.1 K/uL Final   Immature Granulocytes 04/20/2024 0  % Final   Abs Immature Granulocytes 04/20/2024 0.03  0.00 - 0.07 K/uL Final   Performed at [facility], [address], [place], [place] [address]  Admission on 04/15/2024, Discharged on 04/16/2024  Component Date Value Ref Range Status   Alcohol, Ethyl (B) 04/15/2024 <15  <15 mg/dL Final   Comment: (NOTE) For medical purposes only. Performed at [facility], [address], [place], [place] [address]    TSH 04/15/2024 0.945  0.350 - 4.500 uIU/mL Final   Comment: Performed by a 3rd Generation assay with a functional sensitivity of <=0.01 uIU/mL. Performed at [facility], [address], [place], [place] [address]    Sodium 04/15/2024 139  135 - 145 mmol/L Final   Potassium 04/15/2024 4.3  3.5 - 5.1 mmol/L Final   Chloride 04/15/2024 105  98 - 111 mmol/L Final   CO2 04/15/2024 26  22 - 32 mmol/L Final   Glucose, Bld 04/15/2024 89  70 - 99 mg/dL Final   Glucose reference range applies only to samples taken after fasting for at least 8 hours.   BUN 04/15/2024 20  6 - 20 mg/dL Final   Creatinine, Ser  04/15/2024 1.23  0.61 - 1.24 mg/dL Final   Calcium 89/85/7974 8.7 (L)  8.9 - 10.3 mg/dL Final   Total Protein  04/15/2024 5.1 (L)  6.5 - 8.1 g/dL Final   Albumin 89/85/7974 3.1 (L)  3.5 - 5.0 g/dL Final   AST 89/85/7974 29  15 - 41 U/L Final   ALT 04/15/2024 21  0 - 44 U/L Final   Alkaline Phosphatase 04/15/2024 55  38 - 126 U/L Final   Total Bilirubin 04/15/2024 0.3  0.0 - 1.2 mg/dL Final   GFR, Estimated 04/15/2024 >60  >60 mL/min Final   Comment: (NOTE) Calculated using the CKD-EPI Creatinine Equation (2021)    Anion gap 04/15/2024 8  5 - 15 Final   Performed at [facility], [address], [place], [place] [address]   WBC 04/15/2024 7.6  4.0 - 10.5 K/uL Final   RBC 04/15/2024 4.97  4.22 - 5.81 MIL/uL Final   Hemoglobin 04/15/2024 12.9 (L)  13.0 - 17.0 g/dL Final   HCT 89/85/7974 40.7  39.0 - 52.0 % Final   MCV 04/15/2024 81.9  80.0 - 100.0 fL Final   MCH 04/15/2024 26.0  26.0 - 34.0 pg Final   MCHC 04/15/2024 31.7  30.0 - 36.0 g/dL Final   RDW 89/85/7974 13.7  11.5 - 15.5 % Final   Platelets 04/15/2024 239  150 - 400 K/uL Final   nRBC 04/15/2024 0.0  0.0 - 0.2 % Final   Neutrophils Relative % 04/15/2024 51  % Final   Neutro Abs 04/15/2024 3.8  1.7 - 7.7 K/uL Final   Lymphocytes Relative 04/15/2024 32  % Final   Lymphs Abs 04/15/2024 2.4  0.7 - 4.0 K/uL Final   Monocytes Relative 04/15/2024 9  % Final   Monocytes Absolute 04/15/2024 0.7  0.1 - 1.0 K/uL Final   Eosinophils Relative 04/15/2024 7  % Final   Eosinophils Absolute 04/15/2024 0.6 (H)  0.0 - 0.5 K/uL Final   Basophils Relative 04/15/2024 1  % Final   Basophils Absolute 04/15/2024 0.1  0.0 - 0.1 K/uL Final   Immature Granulocytes 04/15/2024 0  % Final   Abs Immature Granulocytes 04/15/2024 0.02  0.00 - 0.07 K/uL Final   Performed at [facility], [address], [place], [place] [address]   POC Amphetamine UR 04/15/2024 None Detected  NONE DETECTED (Cut Off Level 1000 ng/mL) Final   POC Secobarbital (BAR)  04/15/2024 None Detected  NONE DETECTED (Cut Off Level 300 ng/mL) Final   POC Buprenorphine (BUP) 04/15/2024 None Detected  NONE DETECTED (Cut Off Level 10 ng/mL) Final   POC Oxazepam (BZO) 04/15/2024 None Detected (A)  NONE DETECTED (Cut Off Level 300 ng/mL) Final   POC Cocaine UR 04/15/2024 Positive (A)  NONE DETECTED (Cut Off Level 300 ng/mL) Final   POC Methamphetamine UR 04/15/2024 None Detected  NONE DETECTED (Cut Off Level 1000 ng/mL) Final   POC Morphine 04/15/2024 None Detected  NONE DETECTED (Cut Off Level 300 ng/mL) Final   POC Methadone UR 04/15/2024 None Detected  NONE DETECTED (Cut Off Level 300 ng/mL) Final   POC Oxycodone UR 04/15/2024 None Detected  NONE DETECTED (Cut Off Level 100 ng/mL) Final   POC Marijuana UR 04/15/2024 None Detected  NONE DETECTED (Cut Off Level 50 ng/mL) Final  Admission on 04/06/2024, Discharged on 04/06/2024  Component Date Value Ref Range Status   Sodium 04/06/2024 137  135 - 145 mmol/L Final   Potassium 04/06/2024 4.2  3.5 - 5.1 mmol/L Final   Chloride 04/06/2024 101  98 - 111 mmol/L Final   CO2 04/06/2024 24  22 - 32 mmol/L Final  Glucose, Bld 04/06/2024 70  70 - 99 mg/dL Final   Glucose reference range applies only to samples taken after fasting for at least 8 hours.   BUN 04/06/2024 17  6 - 20 mg/dL Final   Creatinine, Ser 04/06/2024 1.13  0.61 - 1.24 mg/dL Final   Calcium 89/94/7974 9.3  8.9 - 10.3 mg/dL Final   Total Protein 89/94/7974 6.1 (L)  6.5 - 8.1 g/dL Final   Albumin 89/94/7974 4.1  3.5 - 5.0 g/dL Final   AST 89/94/7974 29  15 - 41 U/L Final   ALT 04/06/2024 18  0 - 44 U/L Final   Alkaline Phosphatase 04/06/2024 58  38 - 126 U/L Final   Total Bilirubin 04/06/2024 0.8  0.0 - 1.2 mg/dL Final   GFR, Estimated 04/06/2024 >60  >60 mL/min Final   Comment: (NOTE) Calculated using the CKD-EPI Creatinine Equation (2021)    Anion gap 04/06/2024 13  5 - 15 Final   Performed at [facility], [address]  [place], [place] [address]   Opiates 04/06/2024 NEGATIVE  NEGATIVE Final   Cocaine 04/06/2024 POSITIVE (A)  NEGATIVE Final   Benzodiazepines 04/06/2024 NEGATIVE  NEGATIVE Final   Amphetamines 04/06/2024 NEGATIVE  NEGATIVE Final   Tetrahydrocannabinol 04/06/2024 NEGATIVE  NEGATIVE Final   Barbiturates 04/06/2024 NEGATIVE  NEGATIVE Final   Methadone Scn, Ur 04/06/2024 NEGATIVE  NEGATIVE Final   Fentanyl  04/06/2024 NEGATIVE  NEGATIVE Final   Comment: (NOTE) Drug screen is for Medical Purposes only. Positive results are preliminary only. If confirmation is needed, notify lab within 5 days.  Drug Class                 Cutoff (ng/mL) Amphetamine and metabolites 1000 Barbiturate and metabolites 200 Benzodiazepine              200 Opiates and metabolites     300 Cocaine and metabolites     300 THC                         50 Fentanyl                     5 Methadone                   300  Trazodone  is metabolized in vivo to several metabolites,  including pharmacologically active m-CPP, which is excreted in the  urine.  Immunoassay screens for amphetamines and MDMA have potential  cross-reactivity with these compounds and may provide false positive  result.  Performed at [facility], [address], [place], [place] [address]    Salicylate Lvl 04/06/2024 <7.0 (L)  7.0 - 30.0 mg/dL Final   Performed at [facility], [address], [place], [place] [address]   Acetaminophen  (Tylenol ), Serum 04/06/2024 <10 (L)  10 - 30 ug/mL Final   Comment: (NOTE) Toxic concentrations can be more effectively related to post dose interval; > 200, > 100, and > 50 ug/mL serum concentrations correspond to toxic concentrations at 4, 8, and 12 hours post dose, respectively.  Performed at [facility], [address] [place], [place] [address]    WBC 04/06/2024 10.8 (H)  4.0 - 10.5 K/uL Final   RBC 04/06/2024 5.69  4.22 - 5.81 MIL/uL Final   Hemoglobin  04/06/2024 14.3  13.0 - 17.0 g/dL Final   HCT 89/94/7974 46.6  39.0 - 52.0 % Final   MCV  04/06/2024 81.9  80.0 - 100.0 fL Final   MCH 04/06/2024 25.1 (L)  26.0 - 34.0 pg Final   MCHC 04/06/2024 30.7  30.0 - 36.0 g/dL Final   RDW 89/94/7974 13.6  11.5 - 15.5 % Final   Platelets 04/06/2024 272  150 - 400 K/uL Final   nRBC 04/06/2024 0.0  0.0 - 0.2 % Final   Neutrophils Relative % 04/06/2024 68  % Final   Neutro Abs 04/06/2024 7.4  1.7 - 7.7 K/uL Final   Lymphocytes Relative 04/06/2024 20  % Final   Lymphs Abs 04/06/2024 2.2  0.7 - 4.0 K/uL Final   Monocytes Relative 04/06/2024 9  % Final   Monocytes Absolute 04/06/2024 1.0  0.1 - 1.0 K/uL Final   Eosinophils Relative 04/06/2024 1  % Final   Eosinophils Absolute 04/06/2024 0.1  0.0 - 0.5 K/uL Final   Basophils Relative 04/06/2024 1  % Final   Basophils Absolute 04/06/2024 0.1  0.0 - 0.1 K/uL Final   Immature Granulocytes 04/06/2024 1  % Final   Abs Immature Granulocytes 04/06/2024 0.05  0.00 - 0.07 K/uL Final   Performed at [facility], [address], [place], [place] [address]   Alcohol, Ethyl (B) 04/06/2024 <15  <15 mg/dL Final   Comment: (NOTE) For medical purposes only. Performed at [facility], [address], [place], [place] [address]   Admission on 03/15/2024, Discharged on 03/16/2024  Component Date Value Ref Range Status   Sodium 03/15/2024 139  135 - 145 mmol/L Final   Potassium 03/15/2024 4.0  3.5 - 5.1 mmol/L Final   Chloride 03/15/2024 104  98 - 111 mmol/L Final   CO2 03/15/2024 23  22 - 32 mmol/L Final   Glucose, Bld 03/15/2024 83  70 - 99 mg/dL Final   Glucose reference range applies only to samples taken after fasting for at least 8 hours.   BUN 03/15/2024 14  6 - 20 mg/dL Final   Creatinine, Ser 03/15/2024 1.07  0.61 - 1.24 mg/dL Final   Calcium 90/86/7974 9.5  8.9 - 10.3 mg/dL Final   Total Protein 90/86/7974 6.3 (L)  6.5 - 8.1 g/dL Final   Albumin 90/86/7974 4.1  3.5 -  5.0 g/dL Final   AST 90/86/7974 30  15 - 41 U/L Final   HEMOLYSIS AT THIS LEVEL MAY AFFECT RESULT   ALT 03/15/2024 23  0 - 44 U/L Final   Alkaline Phosphatase 03/15/2024 58  38 - 126 U/L Final   Total Bilirubin 03/15/2024 0.5  0.0 - 1.2 mg/dL Final   GFR, Estimated 03/15/2024 >60  >60 mL/min Final   Comment: (NOTE) Calculated using the CKD-EPI Creatinine Equation (2021)    Anion gap 03/15/2024 12  5 - 15 Final   Performed at [facility], [address], [place], [place] [address]   Alcohol, Ethyl (B) 03/15/2024 <15  <15 mg/dL Final   Comment: (NOTE) For medical purposes only. Performed at [facility], [address] [place], [place] [address]    Opiates 03/15/2024 NEGATIVE  NEGATIVE Final   Cocaine 03/15/2024 POSITIVE (A)  NEGATIVE Final   Benzodiazepines 03/15/2024 NEGATIVE  NEGATIVE Final   Amphetamines 03/15/2024 NEGATIVE  NEGATIVE Final   Tetrahydrocannabinol 03/15/2024 POSITIVE (A)  NEGATIVE Final   Barbiturates 03/15/2024 NEGATIVE  NEGATIVE Final   Methadone Scn, Ur 03/15/2024 NEGATIVE  NEGATIVE Final   Fentanyl  03/15/2024 NEGATIVE  NEGATIVE Final   Comment: (NOTE) Drug screen is for Medical Purposes  only. Positive results are preliminary only. If confirmation is needed, notify lab within 5 days.  Drug Class                 Cutoff (ng/mL) Amphetamine and metabolites 1000 Barbiturate and metabolites 200 Benzodiazepine              200 Opiates and metabolites     300 Cocaine and metabolites     300 THC                         50 Fentanyl                     5 Methadone                   300  Trazodone  is metabolized in vivo to several metabolites,  including pharmacologically active m-CPP, which is excreted in the  urine.  Immunoassay screens for amphetamines and MDMA have potential  cross-reactivity with these compounds and may provide false positive  result.  Performed at [facility], [address] [place], [place] [address]    WBC 03/15/2024 7.4  4.0 - 10.5 K/uL Final   RBC 03/15/2024 5.15  4.22 - 5.81 MIL/uL Final   Hemoglobin 03/15/2024 13.2  13.0 - 17.0 g/dL Final   HCT 90/86/7974 43.9  39.0 - 52.0 % Final   MCV 03/15/2024 85.2  80.0 - 100.0 fL Final   MCH 03/15/2024 25.6 (L)  26.0 - 34.0 pg Final   MCHC 03/15/2024 30.1  30.0 - 36.0 g/dL Final   RDW 90/86/7974 13.8  11.5 - 15.5 % Final   Platelets 03/15/2024 228  150 - 400 K/uL Final   nRBC 03/15/2024 0.0  0.0 - 0.2 % Final   Performed at [facility], [address] [place], [place] [address]  There may be more visits with results that are not included.    Allergies: Patient has no known allergies.  Medications:  Facility Ordered Medications  Medication   acetaminophen  (TYLENOL ) tablet 650 mg   alum & mag hydroxide-simeth (MAALOX/MYLANTA) 200-200-20 MG/5ML suspension 30 mL   magnesium  hydroxide (MILK OF MAGNESIA) suspension 30 mL   dicyclomine  (BENTYL ) tablet 20 mg   hydrOXYzine  (ATARAX ) tablet 25 mg   loperamide  (IMODIUM ) capsule 2-4 mg   methocarbamol  (ROBAXIN ) tablet 500 mg   naproxen  (NAPROSYN ) tablet 500 mg   ondansetron  (ZOFRAN -ODT) disintegrating tablet 4 mg   haloperidol  (HALDOL ) tablet 5 mg   And   diphenhydrAMINE  (BENADRYL ) capsule 50 mg   haloperidol  lactate (HALDOL ) injection 5 mg   And   diphenhydrAMINE  (BENADRYL ) injection 50 mg   And   LORazepam  (ATIVAN ) injection 2 mg   haloperidol  lactate (HALDOL ) injection 10 mg   And   diphenhydrAMINE  (BENADRYL ) injection 50 mg   And   LORazepam  (ATIVAN ) injection 2 mg   cloNIDine  (CATAPRES ) tablet 0.1 mg   Followed by   NOREEN ON 06/14/2024] cloNIDine  (CATAPRES ) tablet 0.1 mg   Followed by   NOREEN ON 06/16/2024] cloNIDine  (CATAPRES ) tablet 0.1 mg   risperiDONE  (RISPERDAL ) tablet 1 mg   PTA Medications  Medication Sig   lisinopril  (ZESTRIL ) 5 MG tablet Take 5 mg by mouth daily. (Patient not taking: Reported on 05/02/2024)    risperiDONE  (RISPERDAL ) 1 MG tablet Take 1 tablet (1 mg total) by mouth 2 (two) times daily.      Medical Decision Making  Recommend admission to  the continuous observation unit for safety monitoring and re-eval in the am for SI/HI/AVH or paranoia. Recommend COWS protocol-SUD  Lab Orders         CBC with Differential/Platelet         Comprehensive metabolic panel         Ethanol         POCT Urine Drug Screen - (I-Screen)     EKG  Home medication continued -Risperdal  1 mg PO daily at bedtime for mood stabilization?Psychosis  Other Prns -Maalox.MOM -Agitation protocol medications   Recommendations  Based on my evaluation the patient does not appear to have an emergency medical condition.  Recommend admission to the continuous observation unit for safety monitoring and re-eval in the am for SI/HI/AVH or paranoia.  Recommend COWS protocol-SUD  Ivalene Platte V Marithza Malachi, NP 06/12/2024  4:02 AM

## 2024-06-12 NOTE — ED Notes (Signed)
 42 y/o AA admitted to [facility] . Having difficulty staying with aparticular theme.  Experiencing hallucinations, hears late mother calling him, then stated, #Ive never seen her before.  Asking nurse to please pray for him.  Rambling about going to [place] and them.  Able to anser simple short questions when redirected. Pt is is able to follow simpe short commands.  Wfill continue to monitor for safety.
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
## 2024-06-12 NOTE — Progress Notes (Signed)
 Pt is asleep. Respirations are even and unlabored. No signs of acute distress noted. Staff will monitor for pt's safety.

## 2024-06-12 NOTE — Discharge Instructions (Addendum)
 Peer Support Specialist from [facility] is [phone].   Get help right away if: You have thoughts about hurting yourself or others. Get help right away if you feel like you may hurt yourself or others, or have thoughts about taking your own life. Go to your nearest emergency room or: Call 911. Call the National Suicide Prevention Lifeline at (415)774-2787 or 988 in the [place] This is open 24 hours a day. If youre a Veteran: Call 988 and press 1. This is open 24 hours a day. Text the [employer] at 909-487-1579. Summary Mental health is not just the absence of mental illness. It involves understanding your emotions and behaviors, and taking steps to manage them in a healthy way. If you have symptoms of mental or emotional distress, get help from family, friends, a health care provider, or a mental health professional. Practice good mental health behaviors such as stress management skills, self-calming skills, exercise, healthy sleeping and eating, and supportive relationships. This information is not intended to replace advice given to you by your health care provider. Make sure you discuss any questions you have with your health care provider.  Education provided on the fact that if experiencing worsening of psychiatry symptoms including suicidal ideations, homicidal ideations, or having auditory/visual hallucinations, etc, to call 911, 988, come back to this location, or go to the nearest ER. Pt verbalized understanding.

## 2024-06-12 NOTE — ED Provider Notes (Signed)
 [name] OBS Unit Discharge Summary:  Date and Time: 06/12/2024 5:33 PM Name: Aaron Rubio MRN:  [id number]  HPI: Aaron Rubio is a 43 y.o. male with a history of Schizophrenia, MDD, & polysubstance abuse who presented to the [facility] overnight (06/12/24) with complaints of suicidal ideations, with no plan or intent to harm himself. Patient was recommended for an overnight observation, with a goal of reassessment in the morning.  Assessment Prior to discharge: Patient is seen by writer, and  reports non compliance with his psychotropic medications x 4-5 months; He denies suicidal ideations during this encounter, states that the reason for presentation today urgent care, was to see a therapist, and to get started on his medications.  He talks about wanting to speak with a case production designer, theatre/television/film, presents with flight of ideas, talks about multiple topics including time spent when he was in prison, trauma that he endured in his childhood, he has depression, etc.  Talks about being unhoused for multiple years now, since his mom passed away, but states it has been many years ago, states that he sleeps in hotels in peoples couches, and whenever I can.  Writer patient if she can assist with calling shelters for him, to see if any could take him in, but patient declines, insisting that he would like to stay here at the urgent care.  Oceanographer that he is unable to reside here at this facility, as that is not a focus of this environment, but shelters are.  Positive reinforcement given for patient to allow for a shelter to be called, but he is resistant.  The number for his peers support specialist with the [facility] provided to him, and patient made aware to call this person for support with resources, and verbalizes understanding:Peer Theatre Manager from [employer] is [phone].   All indications, owing to the fact that patient is malingering for secondary gains of food and  shelter.  Patient denies SI, and responds if I see another person talking to me about what they are talking about, I get suicidal, in response to being asked specifically if he has suicidal ideations.  Patient denies homicidal ideations, denies auditory or visual hallucinations.  He denies paranoia or delusional thinking.  There are no overt signs of psychosis.  Education provided on the need to present back to the center, to establish care for mental health related services on the second floor.  Risperdal  1 mg nightly for mood stabilization and psychosis sent to pharmacy on file, and patient educated to obtain medication by picking it up from this pharmacy, and present back to this location tomorrow morning to establish care at the open access clinic.  Shelter resources provided, bus ticket provided as well, since he is not receptive to area shelters being called for him.   Suicide Risk Assessment: Patient's risk is Chronic: Denies suicidal ideations at, this time, but has history of Frequent, intense, and enduring suicidal ideations, even though he always denies specific plans or intents to harm self. He presents with multiple risk factors such as low socioeconomic status, low vocational status, male gender & particularly a lack of social support which all render him at a chronic suicide risk. Patient is however able to identify community support available to him such as 911/988, and the fact that he can come back to this location to the ER should he start feeling suicidal or reach out to his peer support specialist.   Diagnosis:  Final diagnoses:  Homelessness  Non compliance w medication regimen  Polysubstance abuse (HCC)  Schizoaffective disorder, unspecified type (HCC)  Malingering   Total Time spent with patient: 45 minutes Additional Social History:    Pain Medications: See MAR Prescriptions: See MAR Over the Counter: See MAR History of alcohol / drug use?: No history of alcohol /  drug abuse Longest period of sobriety (when/how long): Pt denies substance use/ etoh Negative Consequences of Use: Financial (UTA) Withdrawal Symptoms: Agitation (UTA) Name of Substance 1: marijuana, alcohol and ICE 1 - Age of First Use: uta 1 - Amount (size/oz): uta 1 - Frequency: daily 1 - Duration: uta 1 - Last Use / Amount: uta 1 - Method of Aquiring: uta 1- Route of Use: uta  Sleep: Fair  Appetite:  Fair  Current Medications:  Current Facility-Administered Medications  Medication Dose Route Frequency Provider Last Rate Last Admin   acetaminophen  (TYLENOL ) tablet 650 mg  650 mg Oral Q6H PRN Onuoha, Chinwendu V, NP       alum & mag hydroxide-simeth (MAALOX/MYLANTA) 200-200-20 MG/5ML suspension 30 mL  30 mL Oral Q4H PRN Onuoha, Chinwendu V, NP       cloNIDine  (CATAPRES ) tablet 0.1 mg  0.1 mg Oral QID Onuoha, Chinwendu V, NP   0.1 mg at 06/12/24 1026   Followed by   NOREEN ON 06/14/2024] cloNIDine  (CATAPRES ) tablet 0.1 mg  0.1 mg Oral BH-qamhs Onuoha, Chinwendu V, NP       Followed by   NOREEN ON 06/16/2024] cloNIDine  (CATAPRES ) tablet 0.1 mg  0.1 mg Oral QAC breakfast Onuoha, Chinwendu V, NP       dicyclomine  (BENTYL ) tablet 20 mg  20 mg Oral Q6H PRN Onuoha, Chinwendu V, NP       haloperidol  (HALDOL ) tablet 5 mg  5 mg Oral TID PRN Onuoha, Chinwendu V, NP       And   diphenhydrAMINE  (BENADRYL ) capsule 50 mg  50 mg Oral TID PRN Onuoha, Chinwendu V, NP       haloperidol  lactate (HALDOL ) injection 5 mg  5 mg Intramuscular TID PRN Onuoha, Chinwendu V, NP       And   diphenhydrAMINE  (BENADRYL ) injection 50 mg  50 mg Intramuscular TID PRN Onuoha, Chinwendu V, NP       And   LORazepam  (ATIVAN ) injection 2 mg  2 mg Intramuscular TID PRN Onuoha, Chinwendu V, NP       haloperidol  lactate (HALDOL ) injection 10 mg  10 mg Intramuscular TID PRN Onuoha, Chinwendu V, NP       And   diphenhydrAMINE  (BENADRYL ) injection 50 mg  50 mg Intramuscular TID PRN Onuoha, Chinwendu V, NP       And    LORazepam  (ATIVAN ) injection 2 mg  2 mg Intramuscular TID PRN Onuoha, Chinwendu V, NP       hydrOXYzine  (ATARAX ) tablet 25 mg  25 mg Oral Q6H PRN Onuoha, Chinwendu V, NP       loperamide  (IMODIUM ) capsule 2-4 mg  2-4 mg Oral PRN Onuoha, Chinwendu V, NP       magnesium  hydroxide (MILK OF MAGNESIA) suspension 30 mL  30 mL Oral Daily PRN Onuoha, Chinwendu V, NP       methocarbamol  (ROBAXIN ) tablet 500 mg  500 mg Oral Q8H PRN Onuoha, Chinwendu V, NP       ondansetron  (ZOFRAN -ODT) disintegrating tablet 4 mg  4 mg Oral Q6H PRN Onuoha, Chinwendu V, NP       risperiDONE  (RISPERDAL ) tablet 1 mg  1 mg Oral  QHS Onuoha, Chinwendu V, NP   1 mg at 06/12/24 9356   Current Outpatient Medications  Medication Sig Dispense Refill   [START ON 06/13/2024] risperiDONE  (RISPERDAL ) 1 MG tablet Take 1 tablet (1 mg total) by mouth at bedtime. 30 tablet 0    Labs  Lab Results:  Admission on 06/12/2024, Discharged on 06/12/2024  Component Date Value Ref Range Status   WBC 06/12/2024 10.0  4.0 - 10.5 K/uL Final   RBC 06/12/2024 5.63  4.22 - 5.81 MIL/uL Final   Hemoglobin 06/12/2024 14.8  13.0 - 17.0 g/dL Final   HCT 87/88/7974 45.5  39.0 - 52.0 % Final   MCV 06/12/2024 80.8  80.0 - 100.0 fL Final   MCH 06/12/2024 26.3  26.0 - 34.0 pg Final   MCHC 06/12/2024 32.5  30.0 - 36.0 g/dL Final   RDW 87/88/7974 14.1  11.5 - 15.5 % Final   Platelets 06/12/2024 289  150 - 400 K/uL Final   nRBC 06/12/2024 0.0  0.0 - 0.2 % Final   Neutrophils Relative % 06/12/2024 82  % Final   Neutro Abs 06/12/2024 8.1 (H)  1.7 - 7.7 K/uL Final   Lymphocytes Relative 06/12/2024 9  % Final   Lymphs Abs 06/12/2024 0.9  0.7 - 4.0 K/uL Final   Monocytes Relative 06/12/2024 9  % Final   Monocytes Absolute 06/12/2024 0.9  0.1 - 1.0 K/uL Final   Eosinophils Relative 06/12/2024 0  % Final   Eosinophils Absolute 06/12/2024 0.0  0.0 - 0.5 K/uL Final   Basophils Relative 06/12/2024 0  % Final   Basophils Absolute 06/12/2024 0.0  0.0 - 0.1 K/uL  Final   Immature Granulocytes 06/12/2024 0  % Final   Abs Immature Granulocytes 06/12/2024 0.04  0.00 - 0.07 K/uL Final   Performed at [facility], [address] [place], [place] [address]   Sodium 06/12/2024 133 (L)  135 - 145 mmol/L Final   Potassium 06/12/2024 4.5  3.5 - 5.1 mmol/L Final   Chloride 06/12/2024 97 (L)  98 - 111 mmol/L Final   CO2 06/12/2024 25  22 - 32 mmol/L Final   Glucose, Bld 06/12/2024 73  70 - 99 mg/dL Final   Glucose reference range applies only to samples taken after fasting for at least 8 hours.   BUN 06/12/2024 13  6 - 20 mg/dL Final   Creatinine, Ser 06/12/2024 1.26 (H)  0.61 - 1.24 mg/dL Final   Calcium 87/88/7974 9.7  8.9 - 10.3 mg/dL Final   Total Protein 87/88/7974 7.5  6.5 - 8.1 g/dL Final   Albumin 87/88/7974 4.6  3.5 - 5.0 g/dL Final   AST 87/88/7974 49 (H)  15 - 41 U/L Final   ALT 06/12/2024 27  0 - 44 U/L Final   Alkaline Phosphatase 06/12/2024 78  38 - 126 U/L Final   Total Bilirubin 06/12/2024 1.5 (H)  0.0 - 1.2 mg/dL Final   GFR, Estimated 06/12/2024 >60  >60 mL/min Final   Comment: (NOTE) Calculated using the CKD-EPI Creatinine Equation (2021)    Anion gap 06/12/2024 11  5 - 15 Final   Performed at [facility], [address], [place], [place] [address]   POC Amphetamine UR 06/12/2024 Positive (A)  NONE DETECTED (Cut Off Level 1000 ng/mL) Final   POC Secobarbital (BAR) 06/12/2024 None Detected  NONE DETECTED (Cut Off Level 300 ng/mL) Final   POC Buprenorphine (BUP) 06/12/2024 None Detected  NONE DETECTED (Cut Off Level 10 ng/mL)  Final   POC Oxazepam (BZO) 06/12/2024 None Detected  NONE DETECTED (Cut Off Level 300 ng/mL) Final   POC Cocaine UR 06/12/2024 None Detected  NONE DETECTED (Cut Off Level 300 ng/mL) Final   POC Methamphetamine UR 06/12/2024 Positive (A)  NONE DETECTED (Cut Off Level 1000 ng/mL) Final   POC Morphine 06/12/2024 None Detected  NONE DETECTED (Cut Off Level 300 ng/mL) Final   POC Methadone UR 06/12/2024 None  Detected  NONE DETECTED (Cut Off Level 300 ng/mL) Final   POC Oxycodone UR 06/12/2024 None Detected  NONE DETECTED (Cut Off Level 100 ng/mL) Final   POC Marijuana UR 06/12/2024 Positive (A)  NONE DETECTED (Cut Off Level 50 ng/mL) Final   Alcohol, Ethyl (B) 06/12/2024 <15  <15 mg/dL Final   Comment: (NOTE) For medical purposes only. Performed at [facility], [address], [place], [place] [address]   Admission on 06/07/2024, Discharged on 06/07/2024  Component Date Value Ref Range Status   Lipase 06/07/2024 28  11 - 51 U/L Final   Performed at [facility], [address], [place], [place] [address]   Sodium 06/07/2024 138  135 - 145 mmol/L Final   Potassium 06/07/2024 4.1  3.5 - 5.1 mmol/L Final   Chloride 06/07/2024 107  98 - 111 mmol/L Final   CO2 06/07/2024 27  22 - 32 mmol/L Final   Glucose, Bld 06/07/2024 66 (L)  70 - 99 mg/dL Final   Glucose reference range applies only to samples taken after fasting for at least 8 hours.   BUN 06/07/2024 15  6 - 20 mg/dL Final   Creatinine, Ser 06/07/2024 1.39 (H)  0.61 - 1.24 mg/dL Final   Calcium 87/93/7974 9.0  8.9 - 10.3 mg/dL Final   Total Protein 87/93/7974 6.2 (L)  6.5 - 8.1 g/dL Final   Albumin 87/93/7974 3.6  3.5 - 5.0 g/dL Final   AST 87/93/7974 26  15 - 41 U/L Final   ALT 06/07/2024 17  0 - 44 U/L Final   Alkaline Phosphatase 06/07/2024 62  38 - 126 U/L Final   Total Bilirubin 06/07/2024 0.2  0.0 - 1.2 mg/dL Final   GFR, Estimated 06/07/2024 >60  >60 mL/min Final   Comment: (NOTE) Calculated using the CKD-EPI Creatinine Equation (2021)    Anion gap 06/07/2024 4 (L)  5 - 15 Final   Performed at [facility], [address], [place], [place] [address]   WBC 06/07/2024 5.9  4.0 - 10.5 K/uL Final   RBC 06/07/2024 5.25  4.22 - 5.81 MIL/uL Final   Hemoglobin 06/07/2024 13.7  13.0 - 17.0 g/dL Final   HCT 87/93/7974 43.0  39.0 - 52.0 % Final   MCV 06/07/2024 81.9  80.0 - 100.0 fL Final   MCH 06/07/2024 26.1  26.0  - 34.0 pg Final   MCHC 06/07/2024 31.9  30.0 - 36.0 g/dL Final   RDW 87/93/7974 13.8  11.5 - 15.5 % Final   Platelets 06/07/2024 220  150 - 400 K/uL Final   nRBC 06/07/2024 0.0  0.0 - 0.2 % Final   Performed at [facility], [address], [place], [place] [address]   Color, Urine 06/07/2024 YELLOW  YELLOW Final   APPearance 06/07/2024 CLOUDY (A)  CLEAR Final   Specific Gravity, Urine 06/07/2024 1.015  1.005 - 1.030 Final   pH 06/07/2024 8.0  5.0 - 8.0 Final   Glucose, UA 06/07/2024 NEGATIVE  NEGATIVE mg/dL Final   Hgb  urine dipstick 06/07/2024 NEGATIVE  NEGATIVE Final   Bilirubin Urine 06/07/2024 NEGATIVE  NEGATIVE Final   Ketones, ur 06/07/2024 NEGATIVE  NEGATIVE mg/dL Final   Protein, ur 87/93/7974 NEGATIVE  NEGATIVE mg/dL Final   Nitrite 87/93/7974 NEGATIVE  NEGATIVE Final   Leukocytes,Ua 06/07/2024 NEGATIVE  NEGATIVE Final   Performed at [facility], [address], [place], [place] [address]  Admission on 06/02/2024, Discharged on 06/03/2024  Component Date Value Ref Range Status   Sodium 06/02/2024 141  135 - 145 mmol/L Final   Potassium 06/02/2024 3.6  3.5 - 5.1 mmol/L Final   Chloride 06/02/2024 107  98 - 111 mmol/L Final   CO2 06/02/2024 27  22 - 32 mmol/L Final   Glucose, Bld 06/02/2024 82  70 - 99 mg/dL Final   Glucose reference range applies only to samples taken after fasting for at least 8 hours.   BUN 06/02/2024 14  6 - 20 mg/dL Final   Creatinine, Ser 06/02/2024 1.16  0.61 - 1.24 mg/dL Final   Calcium 87/98/7974 8.7 (L)  8.9 - 10.3 mg/dL Final   Total Protein 87/98/7974 6.3 (L)  6.5 - 8.1 g/dL Final   Albumin 87/98/7974 3.7  3.5 - 5.0 g/dL Final   AST 87/98/7974 26  15 - 41 U/L Final   ALT 06/02/2024 18  0 - 44 U/L Final   Alkaline Phosphatase 06/02/2024 59  38 - 126 U/L Final   Total Bilirubin 06/02/2024 0.7  0.0 - 1.2 mg/dL Final   GFR, Estimated 06/02/2024 >60  >60 mL/min Final   Comment: (NOTE) Calculated using the CKD-EPI Creatinine Equation  (2021)    Anion gap 06/02/2024 7  5 - 15 Final   Performed at [facility], [address], [place], [place] [address]   Alcohol, Ethyl (B) 06/02/2024 <15  <15 mg/dL Final   Comment: (NOTE) For medical purposes only. Performed at [facility], [address], [place], [place] [address]    WBC 06/02/2024 8.1  4.0 - 10.5 K/uL Final   RBC 06/02/2024 5.22  4.22 - 5.81 MIL/uL Final   Hemoglobin 06/02/2024 13.5  13.0 - 17.0 g/dL Final   HCT 87/98/7974 43.3  39.0 - 52.0 % Final   MCV 06/02/2024 83.0  80.0 - 100.0 fL Final   MCH 06/02/2024 25.9 (L)  26.0 - 34.0 pg Final   MCHC 06/02/2024 31.2  30.0 - 36.0 g/dL Final   RDW 87/98/7974 13.8  11.5 - 15.5 % Final   Platelets 06/02/2024 220  150 - 400 K/uL Final   nRBC 06/02/2024 0.0  0.0 - 0.2 % Final   Performed at [facility], [address], [place], [place] [address]   Opiates 06/02/2024 NONE DETECTED  NONE DETECTED Final   Cocaine 06/02/2024 NONE DETECTED  NONE DETECTED Final   Benzodiazepines 06/02/2024 NONE DETECTED  NONE DETECTED Final   Amphetamines 06/02/2024 NONE DETECTED  NONE DETECTED Final   Tetrahydrocannabinol 06/02/2024 NONE DETECTED  NONE DETECTED Final   Barbiturates 06/02/2024 NONE DETECTED  NONE DETECTED Final   Comment: (NOTE) DRUG SCREEN FOR MEDICAL PURPOSES ONLY.  IF CONFIRMATION IS NEEDED FOR ANY PURPOSE, NOTIFY LAB WITHIN 5 DAYS.  LOWEST DETECTABLE LIMITS FOR URINE DRUG SCREEN Drug Class                     Cutoff (ng/mL) Amphetamine and metabolites    1000 Barbiturate and metabolites    200 Benzodiazepine  200 Opiates and metabolites        300 Cocaine and metabolites        300 THC                            50 Performed at [facility], [address], [place], [place] [address]   Admission on 05/06/2024, Discharged on 05/06/2024  Component Date Value Ref Range Status   Sodium 05/06/2024 138  135 - 145 mmol/L Final   Potassium 05/06/2024 3.7  3.5 - 5.1 mmol/L  Final   Chloride 05/06/2024 104  98 - 111 mmol/L Final   CO2 05/06/2024 25  22 - 32 mmol/L Final   Glucose, Bld 05/06/2024 89  70 - 99 mg/dL Final   Glucose reference range applies only to samples taken after fasting for at least 8 hours.   BUN 05/06/2024 12  6 - 20 mg/dL Final   Creatinine, Ser 05/06/2024 1.25 (H)  0.61 - 1.24 mg/dL Final   Calcium 88/95/7974 8.9  8.9 - 10.3 mg/dL Final   Total Protein 88/95/7974 6.3 (L)  6.5 - 8.1 g/dL Final   Albumin 88/95/7974 3.6  3.5 - 5.0 g/dL Final   AST 88/95/7974 28  15 - 41 U/L Final   ALT 05/06/2024 20  0 - 44 U/L Final   Alkaline Phosphatase 05/06/2024 54  38 - 126 U/L Final   Total Bilirubin 05/06/2024 0.6  0.0 - 1.2 mg/dL Final   GFR, Estimated 05/06/2024 >60  >60 mL/min Final   Comment: (NOTE) Calculated using the CKD-EPI Creatinine Equation (2021)    Anion gap 05/06/2024 9  5 - 15 Final   Performed at [facility], [address] [place], [place] [address]   Alcohol, Ethyl (B) 05/06/2024 <15  <15 mg/dL Final   Comment: (NOTE) For medical purposes only. Performed at [facility], [address], [place], [place] [address]    WBC 05/06/2024 9.6  4.0 - 10.5 K/uL Final   RBC 05/06/2024 5.03  4.22 - 5.81 MIL/uL Final   Hemoglobin 05/06/2024 12.9 (L)  13.0 - 17.0 g/dL Final   HCT 88/95/7974 41.0  39.0 - 52.0 % Final   MCV 05/06/2024 81.5  80.0 - 100.0 fL Final   MCH 05/06/2024 25.6 (L)  26.0 - 34.0 pg Final   MCHC 05/06/2024 31.5  30.0 - 36.0 g/dL Final   RDW 88/95/7974 13.5  11.5 - 15.5 % Final   Platelets 05/06/2024 265  150 - 400 K/uL Final   nRBC 05/06/2024 0.0  0.0 - 0.2 % Final   Performed at [facility], [address], [place], [place] [address]   Opiates 05/06/2024 NONE DETECTED  NONE DETECTED Final   Cocaine 05/06/2024 POSITIVE (A)  NONE DETECTED Final   Benzodiazepines 05/06/2024 NONE DETECTED  NONE DETECTED Final   Amphetamines 05/06/2024 NONE DETECTED  NONE DETECTED Final   Tetrahydrocannabinol  05/06/2024 NONE DETECTED  NONE DETECTED Final   Barbiturates 05/06/2024 NONE DETECTED  NONE DETECTED Final   Comment: (NOTE) DRUG SCREEN FOR MEDICAL PURPOSES ONLY.  IF CONFIRMATION IS NEEDED FOR ANY PURPOSE, NOTIFY LAB WITHIN 5 DAYS.  LOWEST DETECTABLE LIMITS FOR URINE DRUG SCREEN Drug Class                     Cutoff (ng/mL) Amphetamine and metabolites    1000 Barbiturate and metabolites    200 Benzodiazepine  200 Opiates and metabolites        300 Cocaine and metabolites        300 THC                            50 Performed at [facility], [address], [place], [place] [address]   Admission on 05/03/2024, Discharged on 05/03/2024  Component Date Value Ref Range Status   SARS Coronavirus 2 by RT PCR 05/03/2024 NEGATIVE  NEGATIVE Final   Influenza A by PCR 05/03/2024 NEGATIVE  NEGATIVE Final   Influenza B by PCR 05/03/2024 NEGATIVE  NEGATIVE Final   Comment: (NOTE) The Xpert Xpress SARS-CoV-2/FLU/RSV plus assay is intended as an aid in the diagnosis of influenza from Nasopharyngeal swab specimens and should not be used as a sole basis for treatment. Nasal washings and aspirates are unacceptable for Xpert Xpress SARS-CoV-2/FLU/RSV testing.  Fact Sheet for Patients: bloggercourse.com  Fact Sheet for Healthcare Providers: seriousbroker.it  This test is not yet approved or cleared by the [place]  FDA and has been authorized for detection and/or diagnosis of SARS-CoV-2 by FDA under an Emergency Use Authorization (EUA). This EUA will remain in effect (meaning this test can be used) for the duration of the COVID-19 declaration under Section 564(b)(1) of the Act, 21 U.S.C. section 360bbb-3(b)(1), unless the authorization is terminated or revoked.     Resp Syncytial Virus by PCR 05/03/2024 NEGATIVE  NEGATIVE Final   Comment: (NOTE) Fact Sheet for  Patients: bloggercourse.com  Fact Sheet for Healthcare Providers: seriousbroker.it  This test is not yet approved or cleared by the [place]  FDA and has been authorized for detection and/or diagnosis of SARS-CoV-2 by FDA under an Emergency Use Authorization (EUA). This EUA will remain in effect (meaning this test can be used) for the duration of the COVID-19 declaration under Section 564(b)(1) of the Act, 21 U.S.C. section 360bbb-3(b)(1), unless the authorization is terminated or revoked.  Performed at [facility], [address] [place], [place] [address]   Admission on 05/01/2024, Discharged on 05/02/2024  Component Date Value Ref Range Status   Sodium 05/01/2024 137  135 - 145 mmol/L Final   Potassium 05/01/2024 4.0  3.5 - 5.1 mmol/L Final   Chloride 05/01/2024 103  98 - 111 mmol/L Final   CO2 05/01/2024 23  22 - 32 mmol/L Final   Glucose, Bld 05/01/2024 98  70 - 99 mg/dL Final   Glucose reference range applies only to samples taken after fasting for at least 8 hours.   BUN 05/01/2024 27 (H)  6 - 20 mg/dL Final   Creatinine, Ser 05/01/2024 1.36 (H)  0.61 - 1.24 mg/dL Final   Calcium 89/69/7974 8.8 (L)  8.9 - 10.3 mg/dL Final   Total Protein 89/69/7974 5.8 (L)  6.5 - 8.1 g/dL Final   Albumin 89/69/7974 3.5  3.5 - 5.0 g/dL Final   AST 89/69/7974 46 (H)  15 - 41 U/L Final   ALT 05/01/2024 25  0 - 44 U/L Final   Alkaline Phosphatase 05/01/2024 59  38 - 126 U/L Final   Total Bilirubin 05/01/2024 0.7  0.0 - 1.2 mg/dL Final   GFR, Estimated 05/01/2024 >60  >60 mL/min Final   Comment: (NOTE) Calculated using the CKD-EPI Creatinine Equation (2021)    Anion gap 05/01/2024 11  5 - 15 Final   Performed at [facility], [address] [place], [place] [address]  Alcohol, Ethyl (B) 05/01/2024 <15  <15 mg/dL Final   Comment: (NOTE) For medical purposes only. Performed at [facility], [address],  [place], [place] [address]    WBC 05/01/2024 10.9 (H)  4.0 - 10.5 K/uL Final   RBC 05/01/2024 5.01  4.22 - 5.81 MIL/uL Final   Hemoglobin 05/01/2024 13.0  13.0 - 17.0 g/dL Final   HCT 89/69/7974 40.9  39.0 - 52.0 % Final   MCV 05/01/2024 81.6  80.0 - 100.0 fL Final   MCH 05/01/2024 25.9 (L)  26.0 - 34.0 pg Final   MCHC 05/01/2024 31.8  30.0 - 36.0 g/dL Final   RDW 89/69/7974 13.9  11.5 - 15.5 % Final   Platelets 05/01/2024 248  150 - 400 K/uL Final   nRBC 05/01/2024 0.0  0.0 - 0.2 % Final   Neutrophils Relative % 05/01/2024 59  % Final   Neutro Abs 05/01/2024 6.5  1.7 - 7.7 K/uL Final   Lymphocytes Relative 05/01/2024 25  % Final   Lymphs Abs 05/01/2024 2.7  0.7 - 4.0 K/uL Final   Monocytes Relative 05/01/2024 11  % Final   Monocytes Absolute 05/01/2024 1.2 (H)  0.1 - 1.0 K/uL Final   Eosinophils Relative 05/01/2024 3  % Final   Eosinophils Absolute 05/01/2024 0.4  0.0 - 0.5 K/uL Final   Basophils Relative 05/01/2024 1  % Final   Basophils Absolute 05/01/2024 0.1  0.0 - 0.1 K/uL Final   Immature Granulocytes 05/01/2024 1  % Final   Abs Immature Granulocytes 05/01/2024 0.05  0.00 - 0.07 K/uL Final   Performed at [facility], [address], [place], [place] [address]  Admission on 04/22/2024, Discharged on 04/24/2024  Component Date Value Ref Range Status   Opiates 04/23/2024 NEGATIVE  NEGATIVE Final   Cocaine 04/23/2024 POSITIVE (A)  NEGATIVE Final   Benzodiazepines 04/23/2024 NEGATIVE  NEGATIVE Final   Amphetamines 04/23/2024 NEGATIVE  NEGATIVE Final   Tetrahydrocannabinol 04/23/2024 NEGATIVE  NEGATIVE Final   Barbiturates 04/23/2024 NEGATIVE  NEGATIVE Final   Methadone Scn, Ur 04/23/2024 NEGATIVE  NEGATIVE Final   Fentanyl  04/23/2024 NEGATIVE  NEGATIVE Final   Comment: (NOTE) Drug screen is for Medical Purposes only. Positive results are preliminary only. If confirmation is needed, notify lab within 5 days.  Drug Class                 Cutoff (ng/mL) Amphetamine and  metabolites 1000 Barbiturate and metabolites 200 Benzodiazepine              200 Opiates and metabolites     300 Cocaine and metabolites     300 THC                         50 Fentanyl                     5 Methadone                   300  Trazodone  is metabolized in vivo to several metabolites,  including pharmacologically active m-CPP, which is excreted in the  urine.  Immunoassay screens for amphetamines and MDMA have potential  cross-reactivity with these compounds and may provide false positive  result.  Performed at [facility], [address] [place], [place] [address]    WBC 04/22/2024 13.1 (H)  4.0 - 10.5 K/uL Final   RBC 04/22/2024 5.51  4.22 - 5.81  MIL/uL Final   Hemoglobin 04/22/2024 14.2  13.0 - 17.0 g/dL Final   HCT 89/78/7974 44.8  39.0 - 52.0 % Final   MCV 04/22/2024 81.3  80.0 - 100.0 fL Final   MCH 04/22/2024 25.8 (L)  26.0 - 34.0 pg Final   MCHC 04/22/2024 31.7  30.0 - 36.0 g/dL Final   RDW 89/78/7974 13.6  11.5 - 15.5 % Final   Platelets 04/22/2024 284  150 - 400 K/uL Final   nRBC 04/22/2024 0.0  0.0 - 0.2 % Final   Neutrophils Relative % 04/22/2024 71  % Final   Neutro Abs 04/22/2024 9.3 (H)  1.7 - 7.7 K/uL Final   Lymphocytes Relative 04/22/2024 18  % Final   Lymphs Abs 04/22/2024 2.3  0.7 - 4.0 K/uL Final   Monocytes Relative 04/22/2024 10  % Final   Monocytes Absolute 04/22/2024 1.3 (H)  0.1 - 1.0 K/uL Final   Eosinophils Relative 04/22/2024 0  % Final   Eosinophils Absolute 04/22/2024 0.0  0.0 - 0.5 K/uL Final   Basophils Relative 04/22/2024 1  % Final   Basophils Absolute 04/22/2024 0.1  0.0 - 0.1 K/uL Final   Immature Granulocytes 04/22/2024 0  % Final   Abs Immature Granulocytes 04/22/2024 0.05  0.00 - 0.07 K/uL Final   Performed at [facility], [address], [place], [place] [address]   Sodium 04/22/2024 138  135 - 145 mmol/L Final   Potassium 04/22/2024 4.4  3.5 - 5.1 mmol/L Final   Chloride 04/22/2024  99  98 - 111 mmol/L Final   CO2 04/22/2024 23  22 - 32 mmol/L Final   Glucose, Bld 04/22/2024 73  70 - 99 mg/dL Final   Glucose reference range applies only to samples taken after fasting for at least 8 hours.   BUN 04/22/2024 20  6 - 20 mg/dL Final   Creatinine, Ser 04/22/2024 1.46 (H)  0.61 - 1.24 mg/dL Final   Calcium 89/78/7974 10.2  8.9 - 10.3 mg/dL Final   GFR, Estimated 04/22/2024 >60  >60 mL/min Final   Comment: (NOTE) Calculated using the CKD-EPI Creatinine Equation (2021)    Anion gap 04/22/2024 15  5 - 15 Final   Performed at [facility], [address] [place], [place] [address]   Alcohol, Ethyl (B) 04/22/2024 <15  <15 mg/dL Final   Comment: (NOTE) For medical purposes only. Performed at [facility], [address] [place], [place] [address]   Admission on 04/19/2024, Discharged on 04/21/2024  Component Date Value Ref Range Status   Sodium 04/20/2024 136  135 - 145 mmol/L Final   Potassium 04/20/2024 3.9  3.5 - 5.1 mmol/L Final   Chloride 04/20/2024 101  98 - 111 mmol/L Final   CO2 04/20/2024 26  22 - 32 mmol/L Final   Glucose, Bld 04/20/2024 86  70 - 99 mg/dL Final   Glucose reference range applies only to samples taken after fasting for at least 8 hours.   BUN 04/20/2024 12  6 - 20 mg/dL Final   Creatinine, Ser 04/20/2024 1.26 (H)  0.61 - 1.24 mg/dL Final   Calcium 89/80/7974 8.9  8.9 - 10.3 mg/dL Final   Total Protein 89/80/7974 5.8 (L)  6.5 - 8.1 g/dL Final   Albumin 89/80/7974 3.4 (L)  3.5 - 5.0 g/dL Final   AST 89/80/7974 25  15 - 41 U/L Final   ALT 04/20/2024 19  0 - 44 U/L Final   Alkaline Phosphatase 04/20/2024  50  38 - 126 U/L Final   Total Bilirubin 04/20/2024 0.7  0.0 - 1.2 mg/dL Final   GFR, Estimated 04/20/2024 >60  >60 mL/min Final   Comment: (NOTE) Calculated using the CKD-EPI Creatinine Equation (2021)    Anion gap 04/20/2024 9  5 - 15 Final   Performed at [facility], [address],  [place], [place] [address]   Alcohol, Ethyl (B) 04/20/2024 <15  <15 mg/dL Final   Comment: (NOTE) For medical purposes only. Performed at [facility], [address] [place], [place] [address]    Opiates 04/20/2024 NONE DETECTED  NONE DETECTED Final   Cocaine 04/20/2024 POSITIVE (A)  NONE DETECTED Final   Benzodiazepines 04/20/2024 NONE DETECTED  NONE DETECTED Final   Amphetamines 04/20/2024 NONE DETECTED  NONE DETECTED Final   Tetrahydrocannabinol 04/20/2024 NONE DETECTED  NONE DETECTED Final   Barbiturates 04/20/2024 NONE DETECTED  NONE DETECTED Final   Comment: (NOTE) DRUG SCREEN FOR MEDICAL PURPOSES ONLY.  IF CONFIRMATION IS NEEDED FOR ANY PURPOSE, NOTIFY LAB WITHIN 5 DAYS.  LOWEST DETECTABLE LIMITS FOR URINE DRUG SCREEN Drug Class                     Cutoff (ng/mL) Amphetamine and metabolites    1000 Barbiturate and metabolites    200 Benzodiazepine                 200 Opiates and metabolites        300 Cocaine and metabolites        300 THC                            50 Performed at [facility], [address], [place], [place] [address]    WBC 04/20/2024 7.9  4.0 - 10.5 K/uL Final   RBC 04/20/2024 5.75  4.22 - 5.81 MIL/uL Final   Hemoglobin 04/20/2024 14.9  13.0 - 17.0 g/dL Final   HCT 89/80/7974 47.2  39.0 - 52.0 % Final   MCV 04/20/2024 82.1  80.0 - 100.0 fL Final   MCH 04/20/2024 25.9 (L)  26.0 - 34.0 pg Final   MCHC 04/20/2024 31.6  30.0 - 36.0 g/dL Final   RDW 89/80/7974 13.6  11.5 - 15.5 % Final   Platelets 04/20/2024 266  150 - 400 K/uL Final   nRBC 04/20/2024 0.0  0.0 - 0.2 % Final   Neutrophils Relative % 04/20/2024 61  % Final   Neutro Abs 04/20/2024 4.8  1.7 - 7.7 K/uL Final   Lymphocytes Relative 04/20/2024 24  % Final   Lymphs Abs 04/20/2024 1.9  0.7 - 4.0 K/uL Final   Monocytes Relative 04/20/2024 9  % Final   Monocytes Absolute 04/20/2024 0.7  0.1 - 1.0 K/uL Final   Eosinophils Relative 04/20/2024 5  % Final   Eosinophils Absolute  04/20/2024 0.4  0.0 - 0.5 K/uL Final   Basophils Relative 04/20/2024 1  % Final   Basophils Absolute 04/20/2024 0.1  0.0 - 0.1 K/uL Final   Immature Granulocytes 04/20/2024 0  % Final   Abs Immature Granulocytes 04/20/2024 0.03  0.00 - 0.07 K/uL Final   Performed at [facility], [address] [place], [place] [address]  Admission on 04/15/2024, Discharged on 04/16/2024  Component Date Value Ref Range Status   Alcohol, Ethyl (B) 04/15/2024 <15  <15 mg/dL Final   Comment: (NOTE) For medical purposes  only. Performed at [facility], [address], [place], [place] [address]    TSH 04/15/2024 0.945  0.350 - 4.500 uIU/mL Final   Comment: Performed by a 3rd Generation assay with a functional sensitivity of <=0.01 uIU/mL. Performed at [facility], [address] [place], [place] [address]    Sodium 04/15/2024 139  135 - 145 mmol/L Final   Potassium 04/15/2024 4.3  3.5 - 5.1 mmol/L Final   Chloride 04/15/2024 105  98 - 111 mmol/L Final   CO2 04/15/2024 26  22 - 32 mmol/L Final   Glucose, Bld 04/15/2024 89  70 - 99 mg/dL Final   Glucose reference range applies only to samples taken after fasting for at least 8 hours.   BUN 04/15/2024 20  6 - 20 mg/dL Final   Creatinine, Ser 04/15/2024 1.23  0.61 - 1.24 mg/dL Final   Calcium 89/85/7974 8.7 (L)  8.9 - 10.3 mg/dL Final   Total Protein 89/85/7974 5.1 (L)  6.5 - 8.1 g/dL Final   Albumin 89/85/7974 3.1 (L)  3.5 - 5.0 g/dL Final   AST 89/85/7974 29  15 - 41 U/L Final   ALT 04/15/2024 21  0 - 44 U/L Final   Alkaline Phosphatase 04/15/2024 55  38 - 126 U/L Final   Total Bilirubin 04/15/2024 0.3  0.0 - 1.2 mg/dL Final   GFR, Estimated 04/15/2024 >60  >60 mL/min Final   Comment: (NOTE) Calculated using the CKD-EPI Creatinine Equation (2021)    Anion gap 04/15/2024 8  5 - 15 Final   Performed at [facility], [address], [place], [place] [address]   WBC 04/15/2024 7.6  4.0 - 10.5 K/uL Final   RBC 04/15/2024 4.97   4.22 - 5.81 MIL/uL Final   Hemoglobin 04/15/2024 12.9 (L)  13.0 - 17.0 g/dL Final   HCT 89/85/7974 40.7  39.0 - 52.0 % Final   MCV 04/15/2024 81.9  80.0 - 100.0 fL Final   MCH 04/15/2024 26.0  26.0 - 34.0 pg Final   MCHC 04/15/2024 31.7  30.0 - 36.0 g/dL Final   RDW 89/85/7974 13.7  11.5 - 15.5 % Final   Platelets 04/15/2024 239  150 - 400 K/uL Final   nRBC 04/15/2024 0.0  0.0 - 0.2 % Final   Neutrophils Relative % 04/15/2024 51  % Final   Neutro Abs 04/15/2024 3.8  1.7 - 7.7 K/uL Final   Lymphocytes Relative 04/15/2024 32  % Final   Lymphs Abs 04/15/2024 2.4  0.7 - 4.0 K/uL Final   Monocytes Relative 04/15/2024 9  % Final   Monocytes Absolute 04/15/2024 0.7  0.1 - 1.0 K/uL Final   Eosinophils Relative 04/15/2024 7  % Final   Eosinophils Absolute 04/15/2024 0.6 (H)  0.0 - 0.5 K/uL Final   Basophils Relative 04/15/2024 1  % Final   Basophils Absolute 04/15/2024 0.1  0.0 - 0.1 K/uL Final   Immature Granulocytes 04/15/2024 0  % Final   Abs Immature Granulocytes 04/15/2024 0.02  0.00 - 0.07 K/uL Final   Performed at [facility], [address] [place], [place] [address]   POC Amphetamine UR 04/15/2024 None Detected  NONE DETECTED (Cut Off Level 1000 ng/mL) Final   POC Secobarbital (BAR) 04/15/2024 None Detected  NONE DETECTED (Cut Off Level 300 ng/mL) Final   POC Buprenorphine (BUP) 04/15/2024 None Detected  NONE DETECTED (Cut Off Level 10 ng/mL) Final   POC Oxazepam (BZO) 04/15/2024 None Detected (A)  NONE DETECTED (Cut Off Level 300 ng/mL) Final   POC Cocaine UR 04/15/2024 Positive (A)  NONE DETECTED (Cut Off Level 300 ng/mL) Final   POC Methamphetamine UR 04/15/2024 None Detected  NONE DETECTED (Cut Off Level 1000 ng/mL) Final   POC Morphine 04/15/2024 None Detected  NONE DETECTED (Cut Off Level 300 ng/mL) Final   POC Methadone UR 04/15/2024 None Detected  NONE DETECTED (Cut Off Level 300 ng/mL) Final   POC Oxycodone UR 04/15/2024 None Detected  NONE DETECTED (Cut Off Level 100  ng/mL) Final   POC Marijuana UR 04/15/2024 None Detected  NONE DETECTED (Cut Off Level 50 ng/mL) Final  Admission on 04/06/2024, Discharged on 04/06/2024  Component Date Value Ref Range Status   Sodium 04/06/2024 137  135 - 145 mmol/L Final   Potassium 04/06/2024 4.2  3.5 - 5.1 mmol/L Final   Chloride 04/06/2024 101  98 - 111 mmol/L Final   CO2 04/06/2024 24  22 - 32 mmol/L Final   Glucose, Bld 04/06/2024 70  70 - 99 mg/dL Final   Glucose reference range applies only to samples taken after fasting for at least 8 hours.   BUN 04/06/2024 17  6 - 20 mg/dL Final   Creatinine, Ser 04/06/2024 1.13  0.61 - 1.24 mg/dL Final   Calcium 89/94/7974 9.3  8.9 - 10.3 mg/dL Final   Total Protein 89/94/7974 6.1 (L)  6.5 - 8.1 g/dL Final   Albumin 89/94/7974 4.1  3.5 - 5.0 g/dL Final   AST 89/94/7974 29  15 - 41 U/L Final   ALT 04/06/2024 18  0 - 44 U/L Final   Alkaline Phosphatase 04/06/2024 58  38 - 126 U/L Final   Total Bilirubin 04/06/2024 0.8  0.0 - 1.2 mg/dL Final   GFR, Estimated 04/06/2024 >60  >60 mL/min Final   Comment: (NOTE) Calculated using the CKD-EPI Creatinine Equation (2021)    Anion gap 04/06/2024 13  5 - 15 Final   Performed at [facility], [address], [place], [place] [address]   Opiates 04/06/2024 NEGATIVE  NEGATIVE Final   Cocaine 04/06/2024 POSITIVE (A)  NEGATIVE Final   Benzodiazepines 04/06/2024 NEGATIVE  NEGATIVE Final   Amphetamines 04/06/2024 NEGATIVE  NEGATIVE Final   Tetrahydrocannabinol 04/06/2024 NEGATIVE  NEGATIVE Final   Barbiturates 04/06/2024 NEGATIVE  NEGATIVE Final   Methadone Scn, Ur 04/06/2024 NEGATIVE  NEGATIVE Final   Fentanyl  04/06/2024 NEGATIVE  NEGATIVE Final   Comment: (NOTE) Drug screen is for Medical Purposes only. Positive results are preliminary only. If confirmation is needed, notify lab within 5 days.  Drug Class                 Cutoff (ng/mL) Amphetamine and metabolites 1000 Barbiturate and metabolites  200 Benzodiazepine              200 Opiates and metabolites     300 Cocaine and metabolites     300 THC                         50 Fentanyl                     5 Methadone                   300  Trazodone  is metabolized in vivo to several metabolites,  including pharmacologically active m-CPP, which is excreted in the  urine.  Immunoassay screens for amphetamines and MDMA have potential  cross-reactivity with these compounds and may provide false positive  result.  Performed at [facility], [address], [place], [place] [address]    Salicylate Lvl 04/06/2024 <7.0 (L)  7.0 - 30.0 mg/dL Final   Performed at [facility], [address], [place], [place] [address]   Acetaminophen  (Tylenol ), Serum 04/06/2024 <10 (L)  10 - 30 ug/mL Final   Comment: (NOTE) Toxic concentrations can be more effectively related to post dose interval; > 200, > 100, and > 50 ug/mL serum concentrations correspond to toxic concentrations at 4, 8, and 12 hours post dose, respectively.  Performed at [facility], [address], [place], [place] [address]    WBC 04/06/2024 10.8 (H)  4.0 - 10.5 K/uL Final   RBC 04/06/2024 5.69  4.22 - 5.81 MIL/uL Final   Hemoglobin 04/06/2024 14.3  13.0 - 17.0 g/dL Final   HCT 89/94/7974 46.6  39.0 - 52.0 % Final   MCV 04/06/2024 81.9  80.0 - 100.0 fL Final   MCH 04/06/2024 25.1 (L)  26.0 - 34.0 pg Final   MCHC 04/06/2024 30.7  30.0 - 36.0 g/dL Final   RDW 89/94/7974 13.6  11.5 - 15.5 % Final   Platelets 04/06/2024 272  150 - 400 K/uL Final   nRBC 04/06/2024 0.0  0.0 - 0.2 % Final   Neutrophils Relative % 04/06/2024 68  % Final   Neutro Abs 04/06/2024 7.4  1.7 - 7.7 K/uL Final   Lymphocytes Relative 04/06/2024 20  % Final   Lymphs Abs 04/06/2024 2.2  0.7 - 4.0 K/uL Final   Monocytes Relative 04/06/2024 9  % Final   Monocytes Absolute 04/06/2024 1.0  0.1 - 1.0 K/uL Final   Eosinophils Relative 04/06/2024 1  %  Final   Eosinophils Absolute 04/06/2024 0.1  0.0 - 0.5 K/uL Final   Basophils Relative 04/06/2024 1  % Final   Basophils Absolute 04/06/2024 0.1  0.0 - 0.1 K/uL Final   Immature Granulocytes 04/06/2024 1  % Final   Abs Immature Granulocytes 04/06/2024 0.05  0.00 - 0.07 K/uL Final   Performed at [facility], [address], [place], [place] [address]   Alcohol, Ethyl (B) 04/06/2024 <15  <15 mg/dL Final   Comment: (NOTE) For medical purposes only. Performed at [facility], [address] [place], [place] [address]   There may be more visits with results that are not included.    Blood Alcohol level:  Lab Results  Component Value Date   [facility] <15 06/12/2024   ETH <15 06/02/2024    Metabolic Disorder Labs: Lab Results  Component Value Date   HGBA1C 5.0 10/27/2023   MPG 96.8 10/27/2023   No results found for: PROLACTIN Lab Results  Component Value Date   CHOL 152 10/19/2023   TRIG 144 10/19/2023   HDL 57 10/19/2023   CHOLHDL 2.7 10/19/2023   VLDL 29 10/19/2023   LDLCALC 66 10/19/2023    Therapeutic Lab Levels: No results found for: LITHIUM No results found for: VALPROATE No results found for: CBMZ  Physical Findings   AIMS    Flowsheet Row [facility] Ad[facility] (Discharged) [facility]  AIMS Total Score 0 0   AUDIT    Flowsheet Row Ad[facility]) [facility] Admission (Discharged) [facility]  Ad[facility] ([facility]charged) [facility] Admission (Discharged[facility]  Alcohol Use Disorder Identification Test Final Score (AUDIT) 0 0 0 0   PHQ2-9    Flowsheet Row ED from 04/15/2024 in  [facility]  PHQ-2 Total Score 2  PHQ-9 Total Score 4   Flowsheet Row ED from 06/12/2024 in [facility] ED from 06/08/2024 in [facility] ED from 06/07/2024 in [facility] Emergency Department at [facility]  C-SSRS RISK CATEGORY Error: Question 6 not populated No Risk No Risk     Musculoskeletal  Strength & Muscle Tone: within normal limits Gait & Station: normal Patient leans: N/A  Psychiatric Specialty Exam  Presentation  General Appearance:  Casual  Eye Contact: Fair  Speech: Clear and Coherent  Speech Volume: Normal  Handedness: Right   Mood and Affect  Mood: Depressed; Anxious  Affect: Congruent   Thought Process  Thought Processes: Coherent  Descriptions of Associations:Intact  Orientation:Full (Time, Place and Person)  Thought Content:Logical  Diagnosis of Schizophrenia or Schizoaffective disorder in past: No  Duration of Psychotic Symptoms: N/A   Hallucinations:Hallucinations: None Description of Visual Hallucinations: chronic visual hallucinations-Pt reports seeing out of place distortions.  Ideas of Reference:None  Suicidal Thoughts:Suicidal Thoughts: No SI Passive Intent and/or Plan: Without Plan  Homicidal Thoughts:Homicidal Thoughts: No   Sensorium  Memory: Immediate Fair  Judgment: Fair  Insight: Fair   Art Therapist  Concentration: Fair  Attention Span: Fair  Recall: [employer] of Knowledge: Fair  Language: Fair   Psychomotor Activity  Psychomotor Activity: Psychomotor Activity: Normal   Assets  Assets: Resilience   Sleep  Sleep: Sleep: Fair  No Safety Checks orders active in given range  Nutritional Assessment (For OBS and [facility] admissions only) Has the patient had a weight loss or gain of 10 pounds or more in the last 3 months?: No Has the patient had a decrease in food intake/or  appetite?: No Does the patient have dental problems?: No Does the patient have eating habits or behaviors that may be indicators of an eating disorder including binging or inducing vomiting?: No Has the patient recently lost weight without trying?: 0 Has the patient been eating poorly because of a decreased appetite?: 0 Malnutrition Screening Tool Score: 0   Physical Exam  Physical Exam Vitals and nursing note reviewed.  Constitutional:      Appearance: Normal appearance.  HENT:     Head: Normocephalic.     Nose: Nose normal.  Eyes:     Pupils: Pupils are equal, round, and reactive to light.  Pulmonary:     Effort: Pulmonary effort is normal.  Musculoskeletal:     Cervical back: Normal range of motion.  Neurological:     Mental Status: He is alert.  Psychiatric:        Mood and Affect: Mood normal.    Review of Systems  Psychiatric/Behavioral:  Positive for substance abuse. Negative for depression, hallucinations and suicidal ideas. The patient is not nervous/anxious and does not have insomnia.   All other systems reviewed and are negative.  Blood pressure 128/80, pulse (!) 109, temperature 98.3 F (36.8 C), temperature source Oral, resp. rate 18, SpO2 99%. There is no height or weight on file to calculate BMI.  Pla: Discharge and f/u outpatient. Follow up with [facility]  Walk-in hours for open access (medication management and therapy) are Monday - Friday 8 am to 11 am. Appointments are limited, so please arrive at 7:00 am. Upon arrival, please complete the form on the clipboard located at the front desk. If there are no clipboards available, all appointments have been filled for that day.  [facility] [address] [place]  [address] [phone]   Donia Snell, NP 06/12/2024 5:33 PM

## 2024-06-12 NOTE — Progress Notes (Signed)
 Pt is presently asleep. No signs of acute distress noted. Administered scheduled med per order. Pt endorses passive SI, no plan or intent. Pt verbally contracts for safety on the unit. Pt advised to notify staff when having intrusive thoughts of hurting self or others. Pt verbalized understanding. Pt endorses AVH and reported hearing music and seeing shadows. Pt denies pain and current HI. Staff will monitor for pt's safety.

## 2024-06-12 NOTE — Discharge Summary (Signed)
 Aaron Rubio to be discharged Home per NP order. An After Visit Summary was printed and given to the patient. Patient escorted out, and discharged home via private auto.  [name]  06/12/2024 12:42 PM

## 2024-06-12 NOTE — ED Notes (Signed)
 Patient was provided lunch

## 2024-06-24 NOTE — Progress Notes (Signed)
 ------------------------------------------------------------------------------- Attestation with edits by Corine Aaron Ledger, MD at 06/27/2024  2:40 PM (Updated) Saw Aaron Rubio  with resident. Discussed their case with the resident and agree with findings. Helped develop the plan of care as documented in the resident's note. Agree with note as documented by the resident. Made adjustments if/where appropriate.  On assessment: In his current condition, patient is not an imminent danger to himself or others and does not meet criteria for inpatient psychiatric hospitalization.  Patient seems calm, cooperative, and linear. They deny active or passive suicidal ideation, intent or plan. They deny active or passive homicidal ideation, intent or plan. The patient exhibits future oriented thinking demonstrated by a desire to work on his sobriety through [employer]. The patient is linear and organized on exam.They deny command auditory hallucinations and paranoia. The patient does not appear to be responding to internal stimuli and is not pressured. He denies access to firearms.   The patient expressed a preference to go to [place],   after discharge. We attempted to find a family member who would be willing to receive the patient and no one was able. He did have a family member in [place] that would be able to connect with him but the patient declined to be sent closer to his natural supports in [place]. The patient discussed a shelter that he was used to going to in [place], however they would not be open until Friday at 9 AM and couldn't guarantee a spot for him. To avoid the potential for decompensation in the absence of clear follow up plans and support, we discussed the option of discharging the patient in a few days when he would be more likely to reach the shelter he felt comfortable going to vs discharging him to crisis resources in [facility]. The patient  planned to regroup and initiate his treatment in [facility]. He was given a 30 day supply of his medication. We discussed options for follow up and patient agreed to connect with [facility] for their short term crisis bed through their [employer]. The subsequent plan is for [facility] to assist him with inpatient rehab programs or connecting with the [facility] system, depending on which the patient prefers. Discussed that if he is to follow up with the [facility] walk in clinic.   Given the lack of current suicidality, homicidality, or evidence of acute psychiatric symptoms that prevent this patient from acting on their own volition and caring for their basic needs, this patient does not appear to be at imminent risk to self or others. They decline voluntary admission. Pt does not meet criteria for involuntary inpatient psychiatric care or further psychiatric evaluation in the emergency department at this time. Discussed if they feel unsafe, they should call 911 or return to the ED.  Patient has completed a Financial Risk Analyst, which can be found under the assessment tab. This demonstrates that the patient understands warning signs, triggers, and symptoms of worsening mental health and how to access emergency mental health care if needed. Patient was instructed to call 911, [phone], or return to the emergency room if they experience any concerning symptoms after discharge. Patient voiced understanding and agreed to this.  Every intervention has risks and benefits, including hospitalization. When there are no symptoms or risk factors modifiable by an inpatient admission, the potential negative effects of hospitalization (e.g. reinforcing dependency, removing autonomy, behavior contagion, causing trauma by creating a sense of being trapped) outweigh the benefits. While  continual observation may reduce risk of harm in the moment, it does not decrease the likelihood of harm in the long term.  Acute stabilization is essential to patient safety, but must be carried out in a way that will not violate autonomy, justice, and non-maleficence. At the time of discharge it was determined that this patient had maximized the possible benefits of psychiatric evaluation.    I have personally spent 50 minutes involved in face-to-face and non-face-to-face activities for this patient on the day of the visit.  Professional time spent includes the following activities, in addition to those noted in the documentation: coordination of discharge planning and follow up  -------------------------------------------------------------------------------   Psychiatry Consult Re-Evaluation   Date of Service: 06/24/2024   Subjective/Interval History   Today, patient was seen in their room. Stated mood is trying to stay positive. Denies suicidal/homicidal ideation and auditory/visual hallucinations.. Medication compliant. Does not report medication side effects. Received 1 mg Ativan  prn overnight. Reports that he would like to discharge. Still wants to go to [place], [place]. Requests bus pass there. Does not have anywhere to stay once he arrives, but wants to go there.  Collateral: 06/24/2024: Dr. Dorien was able to reach patient's great-grandmother, Aaron Rubio at [phone]. She reports that he would not be able to stay with her when he discharges as she does not have room for him to stay and she feels endangered by him. He has never been physically aggressive towards her but he has destroyed some beds in her home previously. She then reports that she does not live in [place], but [place], which she states is 60 miles away from [place]. She states his sister (who is having surgery) is nearer in [place], but not in [place]. He has a sister in [place] who has repeatedly offered him help, but he declines. States that he has a persistent pattern of not taking his medicines and using  substances.  06/23/2024: Dr. Dorien attempted to call patient's great-grandmother, Aaron Rubio at [phone], did not receive an answer. HIPAA compliant voicemail with callback number left.   06/14/2024: Dr. Akwaowo called the following number provided by patient [phone]) but was sent to voicemail. Unable to leave voicemail as the mailbox was full.    Objective   Vitals:  Vitals:   06/24/24 0629  BP: 127/72  Pulse: 63  Resp: 16  Temp: 98.2 F (36.8 C)  SpO2: 98%    Labs:  No results found for this or any previous visit (from the past 24 hours).  Medical Review of Systems: Review of Systems See HPI.  Mental Status Examination: General Appearance normal body habitus, appears stated age, and hygiene appropriate  General Behavior cooperative and appropriate eye contact  Psychomotor Activity normoactive  Gait and Station no gait abnormalities  Speech   normal rate, fluent, normal volume, normal tone, normal prosody, and normal amount  Mood   Staying positive  Affect    constricted and stern  Thought Process Mostly linear, much improved from prior exams  Associations Mostly intact  Thought Content/Perceptual Disturbances Denies suicidal/homicidal ideation and auditory/visual hallucinations.  Cognition/Sensorium  orientation grossly intact, memory grossly intact, attention intact, language normal, and fund of knowledge grossly intact  Insight  fair  Judgment fair   Assessment   Psychiatric Diagnoses: Schizoaffective Disorder, Depressive Type (F25.1) (Primary Diagnosis) Cocaine Use Disorder, Severe (F14.20)  Psychosocial and contextual factors: Discharge Risk Assessment  During this patient's time in the emergency department, we were able to take the  following steps to mitigate the patient's risk: counseled patient to strictly avoid etOH/substance abuse, mood/psychotic symptoms responded to treatment, patient accepted for residential etOH/substance use  treatment, psychoeducation about etOH/substance abuse and mental illness, and reduced impulsivity with medication  If the patient had access to firearms, this was addressed in the following way: not applicable  Each patient also develops a safety plan with the treatment team, unless patient declines or is unable to participate.  In terms of contingency planning, the patient's risk may again increase in the event of: relapse on etOH/substance use and treatment non-adherence If the patient or his supports detect signs of decompensation, they should present to ED and collaborate with outpatient provider (if non-emergent)  Risk State: chronic risk without acute elevation  Aahil Fredin is a 43 y.o. male with a history detailed above initially seen on 06/14/2024 for psychosis.   On initial assessment: On assessment, patient is calm but impulsive and disorganized with poor eye contact. He is unkempt in appearance. Affect is constricted. Thought process is disorganized and tangential. Marinell is difficult to follow and patient several furtive glances around the room concerning for paranoia. However, patient was not observed RTIS. Most likely diagnosis at this time is schizophrenia spectrum disorder given patient's disorganized and tangential speech with paranoia, but this is limited given lack of collateral and patient engagement in interview. Must also consider substance-induced psychotic disorder as UDS is pending at this time. Patient shows limited insight and judgement into the severity of their condition and cannot currently contract for safety. Therefore, he meets criteria for inpatient psychiatric hospitalization for acute crisis stabilization, medication management, and further safety disposition planning. Will IVC to OSH given lack of available beds at this facility (1SA). Recommend starting on Risperdal  to treat psychotic symptoms.  On assessment today, [place] denies SI/HI, AVH. Appears  markedly more linear on exam than his presentation and goal-directed. Does not appear to be grossly manic or psychotic. Diagnostic impression favors Schizoaffective Disorder, Depressive Type. Differential diagnosis includes substance-induced psychosis. Patient appears to be much improved when compared to his presentation. Per chart review, he appears to be at his baseline. He clearly and convincingly denies suicidal ideation, homicidal ideation, as well as auditory and visual hallucinations. He is mostly linear and goal-oriented. He has exhibited good frustration tolerance even in light of frustration about not getting a paid trip to [place], [place] . He maintained great behavioral control the day before in the face of not being discharged that day. He has exhibited an ability to plan for alternative options and is future oriented as such. As such, he no longer is deemed to be a danger to his self or others at this time and no longer meets criteria. Patient requests discharge, so we cannot hold him against his will.  Patient given return precautions, and instructed to promptly call 911, 988, or report to their nearest emergency department should they develop suicidal ideation, homicidal ideation, or otherwise feel they are a danger to themselves or others. Patient given 66 mental health hotline number and list of outpatient resources at discharge. [place] safety plan was completed. Patient does not have access to firearms. Will be given 30 day supply of his risperidone  and hydroxyzine  prior to discharge. Patient planning to pursue housing and peer support to [facility], who will work to place him in shelter vs. Intensive outpatient resources based on his preference. Patient voiced understanding and agreement with this plan.  Recommendations   Disposition: - Pt does not meet criteria  for inpatient psychiatric care or further psychiatric evaluation at this time - thank you for the consult.  Further follow up to consider: outpatient substance use follow-up    Medication: - Risperidone  2 mg daily and 3 mg nightly - Hydroxyzine  25 mg three times daily as needed for anxiety  Labs: - Labwork reviewed. Significant findings include creatinine 1.49, UDS (collected on 12/16) negative  - Medical issues or further labwork to consider: Defer to primary team  Case discussed and seen with Dr. Marinus.  Lonni Agent [place], DO 06/24/2024  [facility] Department of Psychiatry & Behavioral Medicine

## 2024-06-25 NOTE — ED Provider Notes (Signed)
 Patient placed in First Look pathway, seen and evaluated for chief complaint of Mental health problem. Brought in by EMS outside of a facility. He initially reported leg pain with EMS however this has been going on for years. Patient discharged from Main campus for acute psychosis yesterday. Hx of schizophrenia. Patient not answering questions appropriately in triage. Unknown SI or HI.   Pertinent exam findings include Pertinent exam findings include  afebrile, VSS,  non-toxic in appearance, speech is clear, and thoughts disorganized, speech rapid and pressured. Based on initial evaluation, labs are currently indicated and radiology studies are not currently indicated as allowed for current processes and treatments as applicable in a triage setting and could be different than if patient were seen in a main treatment area or dependent on labs/imagining after results are displayed.  Patient counseled on process, plan, and necessity for staying for completing the evaluation.   This document serves as a record of services personally performed by Vernice Isaiah RIGGERS  Emergency Department Provider Note   History   Chief Complaint  Patient presents with   Leg Pain   Mental Health Problem     HPI Patient is a 43 year old male with history of housing instability, prior substance use, schizophrenia, presents for unclear reasons.  Patient is difficult to obtain history from.  He gets agitated fairly easily.  I reviewed his record and see that he was at main campus yesterday and actually there since 06/14/2024.  He was discharged yesterday to a shelter.  Patient denies any acute medical complaints at this time.  When asked about his mental health needs or substance use needs, he gets agitated and refuses to answer questions.  I have seen him and previously and chart review indicates that he has a long history of  Past Medical History Medical History[1]   Past Surgical History Surgical History[2]     Medications Current Outpatient Medications  Medication Instructions   hydrOXYzine  (ATARAX ) 25 mg, oral, 3 times daily PRN   risperiDONE  (RisperDAL ) 2 mg tab tablet Take 1.5 tablets (3 mg total) by mouth at bedtime AND 1 tablet (2 mg total) daily.   risperiDONE  (RISPERDAL ) 2 mg, oral, 2 times daily      Allergies Allergies[3]   Family History Family History[4]   Social History Social History   Socioeconomic History   Marital status: Single    Spouse name: Not on file   Number of children: Not on file   Years of education: Not on file   Highest education level: Not on file  Occupational History   Not on file  Tobacco Use   Smoking status: Former   Smokeless tobacco: Never  Vaping Use   Vaping status: Never Used  Substance and Sexual Activity   Alcohol use: Not Currently    Comment: reporting he is drinking one fifth daily but no signs showing this with withdrawals   Drug use: Yes    Types: Crack cocaine   Sexual activity: Not on file  Other Topics Concern   Not on file  Social History Narrative   Not on file   Social Drivers of Health   Living Situation: High Risk (04/07/2024)   Received from [facility]   Living Situation    In the last 12 months, was there a time when you were not able to pay the mortgage or rent on time?: Yes    In the past 12 months, how many times have you moved where you were living?: 2  At any time in the past 12 months, were you homeless or living in a shelter (including now)?: Yes  Food Insecurity: Patient Unable To Answer (06/12/2024)   Received from [facility]   Food vital sign    Within the past 12 months, you worried that your food would run out before you got money to buy more: Patient unable to answer    Within the past 12 months, the food you bought just didn't last and you didn't have money to get more: Patient unable to answer  Recent Concern: Food Insecurity - Food Insecurity Present (04/17/2024)    Received from [facility]   Food vital sign    Within the past 12 months, you worried that your food would run out before you got money to buy more: Sometimes true    Within the past 12 months, the food you bought just didn't last and you didn't have money to get more: Sometimes true  Transportation Needs: Patient Unable To Answer (06/12/2024)   Received from [facility] - [facility]    In the past 12 months, has lack of transportation kept you from medical appointments or from getting medications?: Patient unable to answer    In the past 12 months, has lack of transportation kept you from meetings, work, or from getting things needed for daily living?: Patient unable to answer  Recent Concern: Transportation Needs - Unmet Transportation Needs (04/17/2024)   Received from [facility] - Transportation    In the past 12 months, has lack of transportation kept you from medical appointments or from getting medications?: Yes    In the past 12 months, has lack of transportation kept you from meetings, work, or from getting things needed for daily living?: Yes  Utilities: Patient Unable To Answer (06/12/2024)   Received from [facility]   Utilities    In the past 12 months has the electric, gas, oil, or water company threatened to shut off services in your home?: Patient unable to answer  Safety: Not At Risk (06/12/2024)   Received from [facility]   Safety    Within the last year, have you been afraid of your partner or ex-partner?: No    Within the last year, have you been humiliated or emotionally abused in other ways by your partner or ex-partner?: No    Within the last year, have you been kicked, hit, slapped, or otherwise physically hurt by your partner or ex-partner?: No    Within the last year, have you been raped or forced to have any kind of sexual activity by your partner or ex-partner?: No  Alcohol Screening: Not At Risk (05/21/2023)   Alcohol     Audit C Alcohol risk score: 1  Tobacco Use: Medium Risk (06/08/2024)   Received from [facility]   Patient History    Smoking Tobacco Use: Former    Smokeless Tobacco Use: Unknown    Passive Exposure: Not on file  Depression: Not at risk (10/04/2023)   Received from [facility]   PHQ-2    Patient Health Questionnaire-2 Score: 2  Social Connections: Not on file  Financial Resource Strain: Not on file      Review of Systems  Review of Systems   Physical Exam   ED Triage Vitals [06/25/24 1925]  Temp 98.4 F (36.9 C)  Heart Rate (!) 114  Resp 18  BP (!) 136/92  MAP (mmHg) 107  SpO2 95 %  O2 Device  None (Room air)  O2 Flow Rate (L/min)   Weight 100 kg (220 lb 6.4 oz)    Physical Exam Constitutional:      General: He is not in acute distress.    Appearance: He is well-developed.  HENT:     Head: Atraumatic.     Mouth/Throat:     Mouth: Mucous membranes are moist.  Eyes:     Extraocular Movements: Extraocular movements intact.  Cardiovascular:     Rate and Rhythm: Normal rate and regular rhythm.  Pulmonary:     Effort: Pulmonary effort is normal. No respiratory distress.  Abdominal:     General: Abdomen is flat. There is no distension.  Musculoskeletal:        General: No deformity.     Cervical back: Normal range of motion.  Skin:    General: Skin is warm and dry.  Neurological:     Mental Status: He is alert and oriented to person, place, and time. Mental status is at baseline.  Psychiatric:     Comments: Bizarre affect, easily agitated.     Labs   Abnormal Labs Reviewed  COMPREHENSIVE METABOLIC PANEL - Abnormal; Notable for the following components:      Result Value   Anion Gap 5 (*)    All other components within normal limits  CBC WITH DIFFERENTIAL - Abnormal; Notable for the following components:   Hemoglobin 13.6 (*)    Hematocrit 41.4 (*)    Mean Corpuscular Volume (MCV) 78.8 (*)    Mean Corpuscular Hemoglobin (MCH) 25.9 (*)    Mean  Corpuscular Hemoglobin Conc (MCHC) 32.9 (*)    Monocytes # 1.00 (*)    All other components within normal limits    Labs independently reviewed by myself and considered in medical decision making.  Radiology  None  Procedure Note  Procedures  Medical Decision Making   Medical Decision Making Patient is a 43 year old male with history as above presents for evaluation of mental health concern.  See HPI for full detail.  Patient unwilling to give additional history at this time.  I reviewed his chart and he has a long history of malingering, substance use.  In fact, he was just at [facility] for 11 days.  He was discharged yesterday.  I reviewed the psychiatry note from yesterday.  There does not appear to be a significant change from his condition at that time to his condition presently.  Labs were obtained for medical screening.  I reviewed his labs.  He is medically clear.  I do not believe he has any indication for further inpatient psychiatric evaluation.  I will give him outpatient resources.  His presentation is complicated by long history of noncompliance with psychiatric medications and substance use.  Patient discharged in stable condition  Problems Addressed: Chronic psychosis    (CMD): complicated acute illness or injury Housing instability: complicated acute illness or injury  Amount and/or Complexity of Data Reviewed External Data Reviewed: notes.    Details: Reviewed psychiatry notes from 06/24/2024 as well as ED notes from that visit Labs: ordered.  Risk Diagnosis or treatment significantly limited by social determinants of health.     ED Clinical Impression   1. Housing instability   2. Chronic psychosis    (CMD)      ED Disposition     ED Disposition  Discharge   Condition  Stable   Comment  --           New Prescriptions  No medications on file      FOLLOW UP [facility] [address]  [place] [address] [phone]  Schedule an appointment as soon as possible for a visit    [facility] -  EMERGENCY DEPARTMENT [address] 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

## 2024-06-26 ENCOUNTER — Emergency Department (HOSPITAL_COMMUNITY)
Admission: EM | Admit: 2024-06-26 | Discharge: 2024-06-26 | Discharge status: Against Medical Advice | Source: Home / Self Care

## 2024-06-26 ENCOUNTER — Emergency Department (HOSPITAL_BASED_OUTPATIENT_CLINIC_OR_DEPARTMENT_OTHER)
Admission: EM | Admit: 2024-06-26 | Discharge: 2024-06-26 | Disposition: A | Discharge status: Home / Self Care | Attending: Emergency Medicine | Admitting: Emergency Medicine

## 2024-06-26 ENCOUNTER — Encounter (HOSPITAL_BASED_OUTPATIENT_CLINIC_OR_DEPARTMENT_OTHER): Payer: Self-pay | Admitting: Emergency Medicine

## 2024-06-26 ENCOUNTER — Emergency Department (HOSPITAL_BASED_OUTPATIENT_CLINIC_OR_DEPARTMENT_OTHER)
Admission: EM | Admit: 2024-06-26 | Discharge: 2024-06-26 | Disposition: A | Discharge status: Home / Self Care | Source: Home / Self Care | Attending: Emergency Medicine | Admitting: Emergency Medicine

## 2024-06-26 ENCOUNTER — Encounter (HOSPITAL_BASED_OUTPATIENT_CLINIC_OR_DEPARTMENT_OTHER): Payer: Self-pay

## 2024-06-26 ENCOUNTER — Other Ambulatory Visit: Payer: Self-pay

## 2024-06-26 DIAGNOSIS — M79669 Pain in unspecified lower leg: Secondary | ICD-10-CM | POA: Insufficient documentation

## 2024-06-26 DIAGNOSIS — F419 Anxiety disorder, unspecified: Secondary | ICD-10-CM | POA: Diagnosis present

## 2024-06-26 DIAGNOSIS — G8929 Other chronic pain: Secondary | ICD-10-CM | POA: Insufficient documentation

## 2024-06-26 DIAGNOSIS — Z5321 Procedure and treatment not carried out due to patient leaving prior to being seen by health care provider: Secondary | ICD-10-CM | POA: Insufficient documentation

## 2024-06-26 DIAGNOSIS — M549 Dorsalgia, unspecified: Secondary | ICD-10-CM | POA: Insufficient documentation

## 2024-06-26 DIAGNOSIS — M545 Low back pain, unspecified: Secondary | ICD-10-CM | POA: Diagnosis present

## 2024-06-26 MED ORDER — IBUPROFEN 400 MG PO TABS
600.0000 mg | ORAL_TABLET | Freq: Once | ORAL | Status: AC
  Administered 2024-06-26: 600 mg via ORAL
  Filled 2024-06-26: qty 1

## 2024-06-26 NOTE — ED Triage Notes (Signed)
 [place] and started having leg pain, 7/10.

## 2024-06-26 NOTE — ED Provider Notes (Signed)
 "  EMERGENCY DEPARTMENT AT [facility] Provider Note   CSN: [id number] Arrival date & time: 06/26/24  9787     Patient presents with: Back Pain   Aaron Rubio is a 43 y.o. male.   The history is provided by the patient and medical records.  Back Pain Aaron Rubio is a 43 y.o. male who presents to the Emergency Department complaining of low back pain.  He presents to the emergency department for evaluation of several days of low back pain.  Pain is in both legs as well.  He is unsure if he has had any injuries.  Denies any fevers, abdominal pain, vomiting, dysuria, numbness, weakness.     Prior to Admission medications  Medication Sig Start Date End Date Taking? Authorizing Provider  risperiDONE  (RISPERDAL ) 1 MG tablet Take 1 tablet (1 mg total) by mouth at bedtime. 06/13/24   Tex Drilling, NP    Allergies: Patient has no known allergies.    Review of Systems  Musculoskeletal:  Positive for back pain.  All other systems reviewed and are negative.   Updated Vital Signs BP (!) 134/100 (BP Location: Left Arm)   Pulse 97   Temp 97.9 F (36.6 C) (Oral)   Resp 20   Ht 6' 2 (1.88 m)   Wt 99.8 kg   SpO2 98%   BMI 28.25 kg/m   Physical Exam Vitals and nursing note reviewed.  Constitutional:      Appearance: He is well-developed.  HENT:     Head: Normocephalic and atraumatic.  Cardiovascular:     Rate and Rhythm: Normal rate and regular rhythm.  Pulmonary:     Effort: Pulmonary effort is normal. No respiratory distress.  Abdominal:     Palpations: Abdomen is soft.     Tenderness: There is no abdominal tenderness. There is no guarding or rebound.  Musculoskeletal:        General: No tenderness.     Comments: 2+ DP pulses.  No discrete bony tenderness to palpation.  No lower extremity edema.  Skin:    General: Skin is warm and dry.  Neurological:     Mental Status: He is alert and oriented to person, place, and time.      Comments: 5 out of 5 strength in all 4 extremities with sensation to light touch intact in all 4 extremities  Psychiatric:        Behavior: Behavior normal.     (all labs ordered are listed, but only abnormal results are displayed) Labs Reviewed - No data to display  EKG: None  Radiology: No results found.   Procedures   Medications Ordered in the ED  ibuprofen  (ADVIL ) tablet 600 mg (has no administration in time range)                                    Medical Decision Making  Patient here for evaluation of low back pain for several days.  History is very limited as patient does not answer many questions.  He does not have any focal neurologic deficits and is well-perfused on examination.  He was just discharged from [facility] earlier in the day, he did have labs performed at that facility. current picture is not consistent with acute cord compression, kidney stone, sepsis.  Discussed that patient may follow-up with the PCP for further evaluation of his symptoms.  Feel  he is stable for ongoing outpatient care.  Current picture is not consistent with decompensated psychiatric illness.     Final diagnoses:  Midline low back pain without sciatica, unspecified chronicity    ED Discharge Orders     None          Griselda Norris, MD 06/26/24 0510  "

## 2024-06-26 NOTE — ED Notes (Signed)
 Per ems c/o back pain, leg pain and BL feet pain starting 3 hours ago. Ambulatory on scene. Picked up from the [employer]

## 2024-06-26 NOTE — ED Provider Notes (Signed)
 " [place] EMERGENCY DEPARTMENT AT [facility] Provider Note   CSN: [id number] Arrival date & time: 06/26/24  0710     Patient presents with: Back Pain   Aaron Rubio is a 43 y.o. male.  He is presenting with complaint of worsening anxiety and feeling depressed.  He was just discharged here few hours ago after being seen for back pain.  He has a care plan in place.  He said he on medication for mental health.  He is vague if he has any suicidal thoughts.  He does endorse some chronic back pain foot pain leg pain that he has been seen for multiple times in the past including just a few hours ago.   The history is provided by the patient.  Mental Health Problem Presenting symptoms: suicidal thoughts   Onset quality:  Unable to specify Timing:  Unable to specify Associated symptoms comment:  Back pain      Prior to Admission medications  Medication Sig Start Date End Date Taking? Authorizing Provider  risperiDONE  (RISPERDAL ) 1 MG tablet Take 1 tablet (1 mg total) by mouth at bedtime. 06/13/24   Tex Drilling, NP    Allergies: Patient has no known allergies.    Review of Systems  Psychiatric/Behavioral:  Positive for suicidal ideas.     Updated Vital Signs BP 116/63 (BP Location: Left Arm)   Pulse 98   Temp 97.7 F (36.5 C) (Oral)   Resp 17   SpO2 100%   Physical Exam Vitals and nursing note reviewed.  Constitutional:      Appearance: Normal appearance. He is well-developed.  HENT:     Head: Normocephalic and atraumatic.  Eyes:     Conjunctiva/sclera: Conjunctivae normal.  Cardiovascular:     Rate and Rhythm: Normal rate and regular rhythm.  Pulmonary:     Effort: Pulmonary effort is normal.     Breath sounds: Normal breath sounds.  Musculoskeletal:     Cervical back: Neck supple.  Skin:    General: Skin is warm and dry.  Neurological:     General: No focal deficit present.     Mental Status: He is alert.     GCS: GCS eye subscore is 4.  GCS verbal subscore is 5. GCS motor subscore is 6.     (all labs ordered are listed, but only abnormal results are displayed) Labs Reviewed - No data to display  EKG: None  Radiology: No results found.   Procedures   Medications Ordered in the ED - No data to display  Clinical Course as of 06/26/24 1613  Thu Jun 26, 2024  9258 He has a care plan in place and has over 20 visits this month.  He was just evaluated a few hours ago for same.  Do not feel he needs acute mental health evaluation in the department.  He is given resources for outpatient behavioral health. [MB]    Clinical Course User Index [MB] Towana Ozell BROCKS, MD                                 Medical Decision Making  This patient complains of feeling anxious; this involves an extensive number of treatment Options and is a complaint that carries with it a high risk of complications and morbidity. The differential includes mental health crisis, malingering, anxiety Previous records obtained and reviewed in epic, multiple ED visits for similar presentations Social determinants  considered, housing insecurity Critical Interventions: None  After the interventions stated above, I reevaluated the patient and found patient to be well-appearing in no distress Admission and further testing considered, no indications for further workup at this time.  Given contact information for outpatient mental health and recommended close follow-up with his treatment providers.  Return instructions discussed      Final diagnoses:  Anxiety    ED Discharge Orders     None          Towana Ozell BROCKS, MD 06/26/24 1614  "

## 2024-06-26 NOTE — ED Triage Notes (Signed)
 Pt was having a nervous breakdown this morning. States he his feeling suicidal for the last 4-5 hours.   States that his anxiety is bad today. Also endorses lower back, leg, and feet pain.

## 2024-06-26 NOTE — ED Triage Notes (Signed)
 Lower back, hip, leg and foot pain. X 4-5 days

## 2024-06-28 ENCOUNTER — Ambulatory Visit (HOSPITAL_COMMUNITY)
Admission: EM | Admit: 2024-06-28 | Discharge: 2024-06-29 | Disposition: A | Discharge status: Home / Self Care | Attending: Nurse Practitioner | Admitting: Nurse Practitioner

## 2024-06-28 DIAGNOSIS — Z765 Malingerer [conscious simulation]: Secondary | ICD-10-CM | POA: Insufficient documentation

## 2024-06-28 DIAGNOSIS — Z5900 Homelessness unspecified: Secondary | ICD-10-CM | POA: Insufficient documentation

## 2024-06-28 DIAGNOSIS — F251 Schizoaffective disorder, depressive type: Secondary | ICD-10-CM | POA: Diagnosis present

## 2024-06-28 DIAGNOSIS — F1290 Cannabis use, unspecified, uncomplicated: Secondary | ICD-10-CM | POA: Diagnosis not present

## 2024-06-28 DIAGNOSIS — F19951 Other psychoactive substance use, unspecified with psychoactive substance-induced psychotic disorder with hallucinations: Secondary | ICD-10-CM | POA: Diagnosis not present

## 2024-06-28 DIAGNOSIS — F14151 Cocaine abuse with cocaine-induced psychotic disorder with hallucinations: Secondary | ICD-10-CM | POA: Insufficient documentation

## 2024-06-28 DIAGNOSIS — F1090 Alcohol use, unspecified, uncomplicated: Secondary | ICD-10-CM | POA: Diagnosis not present

## 2024-06-28 DIAGNOSIS — I444 Left anterior fascicular block: Secondary | ICD-10-CM | POA: Diagnosis not present

## 2024-06-28 LAB — COMPREHENSIVE METABOLIC PANEL WITH GFR
ALT: 29 U/L (ref 0–44)
AST: 38 U/L (ref 15–41)
Albumin: 4.5 g/dL (ref 3.5–5.0)
Alkaline Phosphatase: 87 U/L (ref 38–126)
Anion gap: 11 (ref 5–15)
BUN: 25 mg/dL — ABNORMAL HIGH (ref 6–20)
CO2: 27 mmol/L (ref 22–32)
Calcium: 9.7 mg/dL (ref 8.9–10.3)
Chloride: 105 mmol/L (ref 98–111)
Creatinine, Ser: 1.33 mg/dL — ABNORMAL HIGH (ref 0.61–1.24)
GFR, Estimated: >60 mL/min
Glucose, Bld: 80 mg/dL (ref 70–99)
Potassium: 4.4 mmol/L (ref 3.5–5.1)
Sodium: 142 mmol/L (ref 135–145)
Total Bilirubin: 0.5 mg/dL (ref 0.0–1.2)
Total Protein: 6.9 g/dL (ref 6.5–8.1)

## 2024-06-28 LAB — CBC WITH DIFFERENTIAL/PLATELET
Abs Immature Granulocytes: 0.03 K/uL (ref 0.00–0.07)
Basophils Absolute: 0.1 K/uL (ref 0.0–0.1)
Basophils Relative: 1 %
Eosinophils Absolute: 0.3 K/uL (ref 0.0–0.5)
Eosinophils Relative: 4 %
HCT: 43.9 % (ref 39.0–52.0)
Hemoglobin: 14.3 g/dL (ref 13.0–17.0)
Immature Granulocytes: 0 %
Lymphocytes Relative: 24 %
Lymphs Abs: 2.3 K/uL (ref 0.7–4.0)
MCH: 26.5 pg (ref 26.0–34.0)
MCHC: 32.6 g/dL (ref 30.0–36.0)
MCV: 81.4 fL (ref 80.0–100.0)
Monocytes Absolute: 0.9 K/uL (ref 0.1–1.0)
Monocytes Relative: 9 %
Neutro Abs: 6 K/uL (ref 1.7–7.7)
Neutrophils Relative %: 62 %
Platelets: 278 K/uL (ref 150–400)
RBC: 5.39 MIL/uL (ref 4.22–5.81)
RDW: 14 % (ref 11.5–15.5)
WBC: 9.6 K/uL (ref 4.0–10.5)
nRBC: 0 % (ref 0.0–0.2)

## 2024-06-28 LAB — ETHANOL: Alcohol, Ethyl (B): <15 mg/dL

## 2024-06-28 MED ORDER — LORAZEPAM 2 MG/ML IJ SOLN: 2.0000 mg | Freq: Three times a day (TID) | INTRAMUSCULAR | Status: DC | PRN

## 2024-06-28 MED ORDER — HALOPERIDOL 5 MG PO TABS: 5.0000 mg | ORAL_TABLET | Freq: Three times a day (TID) | ORAL | Status: DC | PRN

## 2024-06-28 MED ORDER — DIPHENHYDRAMINE HCL 50 MG PO CAPS: 50.0000 mg | ORAL_CAPSULE | Freq: Three times a day (TID) | ORAL | Status: DC | PRN

## 2024-06-28 MED ORDER — HALOPERIDOL LACTATE 5 MG/ML IJ SOLN: 10.0000 mg | Freq: Three times a day (TID) | INTRAMUSCULAR | Status: DC | PRN

## 2024-06-28 MED ORDER — ALUM & MAG HYDROXIDE-SIMETH 200-200-20 MG/5ML PO SUSP: 30.0000 mL | ORAL | Status: DC | PRN

## 2024-06-28 MED ORDER — HYDROXYZINE HCL 25 MG PO TABS
25.0000 mg | ORAL_TABLET | Freq: Three times a day (TID) | ORAL | Status: DC | PRN
  Administered 2024-06-28: 25 mg via ORAL
  Filled 2024-06-28: qty 1

## 2024-06-28 MED ORDER — ACETAMINOPHEN 325 MG PO TABS: 650.0000 mg | ORAL_TABLET | Freq: Four times a day (QID) | ORAL | Status: DC | PRN

## 2024-06-28 MED ORDER — DIPHENHYDRAMINE HCL 50 MG/ML IJ SOLN: 50.0000 mg | Freq: Three times a day (TID) | INTRAMUSCULAR | Status: DC | PRN

## 2024-06-28 MED ORDER — TRAZODONE HCL 50 MG PO TABS: 50.0000 mg | ORAL_TABLET | Freq: Every evening | ORAL | Status: DC | PRN

## 2024-06-28 MED ORDER — MAGNESIUM HYDROXIDE 400 MG/5ML PO SUSP: 30.0000 mL | Freq: Every day | ORAL | Status: DC | PRN

## 2024-06-28 MED ORDER — RISPERIDONE 2 MG PO TBDP
2.0000 mg | ORAL_TABLET | Freq: Every day | ORAL | Status: DC
  Administered 2024-06-28: 2 mg via ORAL
  Filled 2024-06-28: qty 1

## 2024-06-28 MED ORDER — RISPERIDONE 1 MG PO TBDP
1.0000 mg | ORAL_TABLET | Freq: Every day | ORAL | Status: DC
  Filled 2024-06-28: qty 1

## 2024-06-28 MED ORDER — HALOPERIDOL LACTATE 5 MG/ML IJ SOLN: 5.0000 mg | Freq: Three times a day (TID) | INTRAMUSCULAR | Status: DC | PRN

## 2024-06-28 NOTE — Progress Notes (Signed)
 Patient is currently resting at this time with no acute distress noted.  Respirations present, even and unlabored.  No current issues noted.  Will continue Q 15 min safety checks for safety/behavior per facility protocol.

## 2024-06-28 NOTE — ED Provider Notes (Signed)
 [facility] Continuous Assessment Admission H&P  Date: 06/29/2024 Patient Name: Aaron Rubio MRN: [id number] Chief Complaint: I want to get back on medications  Diagnoses:  Final diagnoses:  Unspecified mood (affective) disorder  Homelessness    YEP:Ojfnwu M. Beaumier is a 43 y/o male with a history of schizophrenia unspecified, homelessness, malingering, substance induced mood disorder, schizoaffective disorder, hallucinations presenting to [facility] as a walk in voluntarily unaccompanied with complaints of wanting to get back on his medications. Patient stated that he wanted to go to [facility] for treatment because he has been feeling down and out.   Patient presents requesting help with his mental health and states he wants to go to [facility]. He endorses feeling depressed. When asked about substance use, patient becomes tangential and provides disorganized responses, including references to falling stars, [place], and the [facility], rather than directly answering questions. Patient reports recent cocaine use approximately 3-4 days ago, unable to quantify amount. He also reports use of synthetic marijuana and alcohol. Patient denies currently taking any psychiatric medications and denies being followed by any outpatient mental health provider. Patient is homeless. Past psychiatric history includes schizoaffective disorder, polysubstance use, homelessness, and malingering. Patient denies any SI/HI but was unresponsive when asked if he was experiencing any AVH.  During evaluation Edon Hoadley is sitting in the assessment room in no acute distress.  He is alert, oriented x 4, calm and cooperative, mood is labile with a bizarre affect. He has normal speech, and behavior. Patient does appear to be experiencing .  Patient is able to converse coherently, goal directed thoughts, no distractibility, or pre-occupation.  He also denies suicidal/self-harm/homicidal ideation, psychosis, and  paranoia.  Patient answered question appropriately. Per chart patient has been prescribed risperdal  during previous inpatient admissions, with good response. Will restart risperidone  for patient.   Patient will be admitted to [facility] continuous observation for crisis management, safety and stabilization. Patient can be reevaluated in the morning to determine if patient can be safely discharged if thought process is less disorganized, if not possibly be admitted to [facility].   Total Time spent with patient: 20 minutes  Musculoskeletal  Strength & Muscle Tone: within normal limits Gait & Station: normal Patient leans: N/A  Psychiatric Specialty Exam  Presentation General Appearance:  Disheveled  Eye Contact: Fair  Speech: Normal Rate  Speech Volume: Normal  Handedness: Right   Mood and Affect  Mood: Labile  Affect: Flat   Thought Process  Thought Processes: Disorganized  Descriptions of Associations:Tangential  Orientation:Partial  Thought Content:Scattered; Illogical  Diagnosis of Schizophrenia or Schizoaffective disorder in past: Yes  Duration of Psychotic Symptoms: Greater than six months  Hallucinations:Hallucinations: None  Ideas of Reference:None  Suicidal Thoughts:Suicidal Thoughts: No SI Active Intent and/or Plan: Without Intent; Without Plan SI Passive Intent and/or Plan: Without Intent; Without Plan  Homicidal Thoughts:Homicidal Thoughts: No HI Active Intent and/or Plan: Without Plan; Without Intent   Sensorium  Memory: Immediate Poor; Recent Poor; Remote Poor  Judgment: Poor  Insight: Poor   Executive Functions  Concentration: Poor  Attention Span: Poor  Recall: Poor  Fund of Knowledge: Poor  Language: Poor   Psychomotor Activity  Psychomotor Activity: Psychomotor Activity: Normal   Assets  Assets: Physical Health; Resilience   Sleep  Sleep: Sleep: Fair   Nutritional Assessment (For OBS and FBC admissions  only) Has the patient had a weight loss or gain of 10 pounds or more in the last 3 months?: No Has  the patient had a decrease in food intake/or appetite?: No Does the patient have dental problems?: No Does the patient have eating habits or behaviors that may be indicators of an eating disorder including binging or inducing vomiting?: No Has the patient recently lost weight without trying?: 0 Has the patient been eating poorly because of a decreased appetite?: 0 Malnutrition Screening Tool Score: 0    Physical Exam HENT:     Head: Normocephalic.     Nose: Nose normal.  Cardiovascular:     Rate and Rhythm: Normal rate.  Abdominal:     General: Abdomen is flat.  Musculoskeletal:        General: Normal range of motion.     Cervical back: Normal range of motion.  Skin:    General: Skin is warm.  Neurological:     Mental Status: He is alert and oriented to person, place, and time.  Psychiatric:        Attention and Perception: He is inattentive.        Mood and Affect: Affect is labile.        Speech: Speech is tangential.        Behavior: Behavior is cooperative.        Thought Content: Thought content is delusional. Thought content is not paranoid. Thought content does not include homicidal or suicidal ideation. Thought content does not include homicidal or suicidal plan.        Cognition and Memory: Cognition is impaired.        Judgment: Judgment is impulsive.    Review of Systems  Constitutional: Negative.   HENT: Negative.    Eyes: Negative.   Respiratory: Negative.    Cardiovascular: Negative.   Genitourinary: Negative.   Musculoskeletal: Negative.   Skin: Negative.   Neurological: Negative.   Psychiatric/Behavioral:  Positive for depression and substance abuse.     Blood pressure (!) 140/80, pulse 88, temperature 98.3 F (36.8 C), temperature source Oral, resp. rate 18, SpO2 100%. There is no height or weight on file to calculate BMI.  Past Psychiatric History:  Schizoaffective Disorder, Depressive Type, Cocaine Use Disorder    Is the patient at risk to self? No  Has the patient been a risk to self in the past 6 months? No .    Has the patient been a risk to self within the distant past? No   Is the patient a risk to others? No   Has the patient been a risk to others in the past 6 months? No   Has the patient been a risk to others within the distant past? No   Past Medical History:  Past Medical History:  Diagnosis Date   Hypertension    Schizophrenia (HCC)      Family History: unknown  Social History:  Social History   Socioeconomic History   Marital status: Married    Spouse name: Not on file   Number of children: Not on file   Years of education: Not on file   Highest education level: Not on file  Occupational History   Not on file  Tobacco Use   Smoking status: Former    Current packs/day: 0.30    Types: Cigarettes   Smokeless tobacco: Not on file  Vaping Use   Vaping status: Every Day   Substances: Nicotine , THC  Substance and Sexual Activity   Alcohol use: Yes    Comment: occassionally   Drug use: Yes    Types: Cocaine, Marijuana  Comment: Daily; last three years   Sexual activity: Yes    Comment: Reports married with kids living in a house  Other Topics Concern   Not on file  Social History Narrative   Not on file   Social Drivers of Health   Tobacco Use: Medium Risk (06/26/2024)   Patient History    Smoking Tobacco Use: Former    Smokeless Tobacco Use: Unknown    Passive Exposure: Not on Actuary Strain: Not on file  Food Insecurity: Patient Unable To Answer (06/28/2024)   Epic    Worried About Programme Researcher, Broadcasting/film/video in the Last Year: Patient unable to answer    Ran Out of Food in the Last Year: Patient unable to answer  Recent Concern: Food Insecurity - Food Insecurity Present (04/17/2024)   Epic    Worried About Programme Researcher, Broadcasting/film/video in the Last Year: Sometimes true    Ran Out of Food  in the Last Year: Sometimes true  Transportation Needs: Patient Unable To Answer (06/12/2024)   Epic    Lack of Transportation (Medical): Patient unable to answer    Lack of Transportation (Non-Medical): Patient unable to answer  Recent Concern: Transportation Needs - Unmet Transportation Needs (04/17/2024)   Epic    Lack of Transportation (Medical): Yes    Lack of Transportation (Non-Medical): Yes  Physical Activity: Not on file  Stress: Not on file  Social Connections: Not on file  Intimate Partner Violence: Not At Risk (06/12/2024)   Epic    Fear of Current or Ex-Partner: No    Emotionally Abused: No    Physically Abused: No    Sexually Abused: No  Depression (PHQ2-9): Low Risk (04/16/2024)   Depression (PHQ2-9)    PHQ-2 Score: 4  Alcohol Screen: Low Risk (04/07/2024)   Alcohol Screen    Last Alcohol Screening Score (AUDIT): 0  Housing: High Risk (04/07/2024)   Epic    Unable to Pay for Housing in the Last Year: Yes    Number of Times Moved in the Last Year: 2    Homeless in the Last Year: Yes  Utilities: Patient Unable To Answer (06/12/2024)   Epic    Threatened with loss of utilities: Patient unable to answer  Health Literacy: Not on file     Last Labs:  Admission on 06/28/2024  Component Date Value Ref Range Status   WBC 06/28/2024 9.6  4.0 - 10.5 K/uL Final   RBC 06/28/2024 5.39  4.22 - 5.81 MIL/uL Final   Hemoglobin 06/28/2024 14.3  13.0 - 17.0 g/dL Final   HCT 87/72/7974 43.9  39.0 - 52.0 % Final   MCV 06/28/2024 81.4  80.0 - 100.0 fL Final   MCH 06/28/2024 26.5  26.0 - 34.0 pg Final   MCHC 06/28/2024 32.6  30.0 - 36.0 g/dL Final   RDW 87/72/7974 14.0  11.5 - 15.5 % Final   Platelets 06/28/2024 278  150 - 400 K/uL Final   nRBC 06/28/2024 0.0  0.0 - 0.2 % Final   Neutrophils Relative % 06/28/2024 62  % Final   Neutro Abs 06/28/2024 6.0  1.7 - 7.7 K/uL Final   Lymphocytes Relative 06/28/2024 24  % Final   Lymphs Abs 06/28/2024 2.3  0.7 - 4.0 K/uL Final    Monocytes Relative 06/28/2024 9  % Final   Monocytes Absolute 06/28/2024 0.9  0.1 - 1.0 K/uL Final   Eosinophils Relative 06/28/2024 4  % Final   Eosinophils Absolute 06/28/2024 0.3  0.0 - 0.5 K/uL Final   Basophils Relative 06/28/2024 1  % Final   Basophils Absolute 06/28/2024 0.1  0.0 - 0.1 K/uL Final   Immature Granulocytes 06/28/2024 0  % Final   Abs Immature Granulocytes 06/28/2024 0.03  0.00 - 0.07 K/uL Final   Performed at [facility], [address] [place], [place] [address]   Sodium 06/28/2024 142  135 - 145 mmol/L Final   Potassium 06/28/2024 4.4  3.5 - 5.1 mmol/L Final   Chloride 06/28/2024 105  98 - 111 mmol/L Final   CO2 06/28/2024 27  22 - 32 mmol/L Final   Glucose, Bld 06/28/2024 80  70 - 99 mg/dL Final   Glucose reference range applies only to samples taken after fasting for at least 8 hours.   BUN 06/28/2024 25 (H)  6 - 20 mg/dL Final   Creatinine, Ser 06/28/2024 1.33 (H)  0.61 - 1.24 mg/dL Final   Calcium 87/72/7974 9.7  8.9 - 10.3 mg/dL Final   Total Protein 87/72/7974 6.9  6.5 - 8.1 g/dL Final   Albumin 87/72/7974 4.5  3.5 - 5.0 g/dL Final   AST 87/72/7974 38  15 - 41 U/L Final   ALT 06/28/2024 29  0 - 44 U/L Final   Alkaline Phosphatase 06/28/2024 87  38 - 126 U/L Final   Total Bilirubin 06/28/2024 0.5  0.0 - 1.2 mg/dL Final   GFR, Estimated 06/28/2024 >60  >60 mL/min Final   Comment: (NOTE) Calculated using the CKD-EPI Creatinine Equation (2021)    Anion gap 06/28/2024 11  5 - 15 Final   Performed at [facility], [address] [place], [place] [address]   Alcohol, Ethyl (B) 06/28/2024 <15  <15 mg/dL Final   Comment: (NOTE) For medical purposes only. Performed at [facility], [address] [place], [place] [address]   Admission on 06/12/2024, Discharged on 06/12/2024  Component Date Value Ref Range Status   WBC 06/12/2024 10.0  4.0 - 10.5 K/uL Final   RBC 06/12/2024 5.63  4.22 - 5.81 MIL/uL Final   Hemoglobin 06/12/2024 14.8   13.0 - 17.0 g/dL Final   HCT 87/88/7974 45.5  39.0 - 52.0 % Final   MCV 06/12/2024 80.8  80.0 - 100.0 fL Final   MCH 06/12/2024 26.3  26.0 - 34.0 pg Final   MCHC 06/12/2024 32.5  30.0 - 36.0 g/dL Final   RDW 87/88/7974 14.1  11.5 - 15.5 % Final   Platelets 06/12/2024 289  150 - 400 K/uL Final   nRBC 06/12/2024 0.0  0.0 - 0.2 % Final   Neutrophils Relative % 06/12/2024 82  % Final   Neutro Abs 06/12/2024 8.1 (H)  1.7 - 7.7 K/uL Final   Lymphocytes Relative 06/12/2024 9  % Final   Lymphs Abs 06/12/2024 0.9  0.7 - 4.0 K/uL Final   Monocytes Relative 06/12/2024 9  % Final   Monocytes Absolute 06/12/2024 0.9  0.1 - 1.0 K/uL Final   Eosinophils Relative 06/12/2024 0  % Final   Eosinophils Absolute 06/12/2024 0.0  0.0 - 0.5 K/uL Final   Basophils Relative 06/12/2024 0  % Final   Basophils Absolute 06/12/2024 0.0  0.0 - 0.1 K/uL Final   Immature Granulocytes 06/12/2024 0  % Final   Abs Immature Granulocytes 06/12/2024 0.04  0.00 - 0.07 K/uL Final   Performed at [facility], [address] [place], [place] [address]   Sodium 06/12/2024 133 (L)  135 -  145 mmol/L Final   Potassium 06/12/2024 4.5  3.5 - 5.1 mmol/L Final   Chloride 06/12/2024 97 (L)  98 - 111 mmol/L Final   CO2 06/12/2024 25  22 - 32 mmol/L Final   Glucose, Bld 06/12/2024 73  70 - 99 mg/dL Final   Glucose reference range applies only to samples taken after fasting for at least 8 hours.   BUN 06/12/2024 13  6 - 20 mg/dL Final   Creatinine, Ser 06/12/2024 1.26 (H)  0.61 - 1.24 mg/dL Final   Calcium 87/88/7974 9.7  8.9 - 10.3 mg/dL Final   Total Protein 87/88/7974 7.5  6.5 - 8.1 g/dL Final   Albumin 87/88/7974 4.6  3.5 - 5.0 g/dL Final   AST 87/88/7974 49 (H)  15 - 41 U/L Final   ALT 06/12/2024 27  0 - 44 U/L Final   Alkaline Phosphatase 06/12/2024 78  38 - 126 U/L Final   Total Bilirubin 06/12/2024 1.5 (H)  0.0 - 1.2 mg/dL Final   GFR, Estimated 06/12/2024 >60  >60 mL/min Final   Comment: (NOTE) Calculated using the  CKD-EPI Creatinine Equation (2021)    Anion gap 06/12/2024 11  5 - 15 Final   Performed at [facility], [address] [place], [place] [address]   POC Amphetamine UR 06/12/2024 Positive (A)  NONE DETECTED (Cut Off Level 1000 ng/mL) Final   POC Secobarbital (BAR) 06/12/2024 None Detected  NONE DETECTED (Cut Off Level 300 ng/mL) Final   POC Buprenorphine (BUP) 06/12/2024 None Detected  NONE DETECTED (Cut Off Level 10 ng/mL) Final   POC Oxazepam (BZO) 06/12/2024 None Detected  NONE DETECTED (Cut Off Level 300 ng/mL) Final   POC Cocaine UR 06/12/2024 None Detected  NONE DETECTED (Cut Off Level 300 ng/mL) Final   POC Methamphetamine UR 06/12/2024 Positive (A)  NONE DETECTED (Cut Off Level 1000 ng/mL) Final   POC Morphine 06/12/2024 None Detected  NONE DETECTED (Cut Off Level 300 ng/mL) Final   POC Methadone UR 06/12/2024 None Detected  NONE DETECTED (Cut Off Level 300 ng/mL) Final   POC Oxycodone UR 06/12/2024 None Detected  NONE DETECTED (Cut Off Level 100 ng/mL) Final   POC Marijuana UR 06/12/2024 Positive (A)  NONE DETECTED (Cut Off Level 50 ng/mL) Final   Alcohol, Ethyl (B) 06/12/2024 <15  <15 mg/dL Final   Comment: (NOTE) For medical purposes only. Performed at [facility], [address], [place], [place] [address]   Admission on 06/07/2024, Discharged on 06/07/2024  Component Date Value Ref Range Status   Lipase 06/07/2024 28  11 - 51 U/L Final   Performed at [facility], [address], [place], [place] [address]   Sodium 06/07/2024 138  135 - 145 mmol/L Final   Potassium 06/07/2024 4.1  3.5 - 5.1 mmol/L Final   Chloride 06/07/2024 107  98 - 111 mmol/L Final   CO2 06/07/2024 27  22 - 32 mmol/L Final   Glucose, Bld 06/07/2024 66 (L)  70 - 99 mg/dL Final   Glucose reference range applies only to samples taken after fasting for at least 8 hours.   BUN 06/07/2024 15  6 - 20 mg/dL Final   Creatinine, Ser 06/07/2024 1.39 (H)  0.61 - 1.24 mg/dL Final   Calcium  87/93/7974 9.0  8.9 - 10.3 mg/dL Final   Total Protein 87/93/7974 6.2 (L)  6.5 - 8.1 g/dL Final   Albumin 87/93/7974 3.6  3.5 - 5.0 g/dL Final   AST  06/07/2024 26  15 - 41 U/L Final   ALT 06/07/2024 17  0 - 44 U/L Final   Alkaline Phosphatase 06/07/2024 62  38 - 126 U/L Final   Total Bilirubin 06/07/2024 0.2  0.0 - 1.2 mg/dL Final   GFR, Estimated 06/07/2024 >60  >60 mL/min Final   Comment: (NOTE) Calculated using the CKD-EPI Creatinine Equation (2021)    Anion gap 06/07/2024 4 (L)  5 - 15 Final   Performed at [facility], [address] [place], [place] [address]   WBC 06/07/2024 5.9  4.0 - 10.5 K/uL Final   RBC 06/07/2024 5.25  4.22 - 5.81 MIL/uL Final   Hemoglobin 06/07/2024 13.7  13.0 - 17.0 g/dL Final   HCT 87/93/7974 43.0  39.0 - 52.0 % Final   MCV 06/07/2024 81.9  80.0 - 100.0 fL Final   MCH 06/07/2024 26.1  26.0 - 34.0 pg Final   MCHC 06/07/2024 31.9  30.0 - 36.0 g/dL Final   RDW 87/93/7974 13.8  11.5 - 15.5 % Final   Platelets 06/07/2024 220  150 - 400 K/uL Final   nRBC 06/07/2024 0.0  0.0 - 0.2 % Final   Performed at [facility], [address] [place], [place] [address]   Color, Urine 06/07/2024 YELLOW  YELLOW Final   APPearance 06/07/2024 CLOUDY (A)  CLEAR Final   Specific Gravity, Urine 06/07/2024 1.015  1.005 - 1.030 Final   pH 06/07/2024 8.0  5.0 - 8.0 Final   Glucose, UA 06/07/2024 NEGATIVE  NEGATIVE mg/dL Final   Hgb urine dipstick 06/07/2024 NEGATIVE  NEGATIVE Final   Bilirubin Urine 06/07/2024 NEGATIVE  NEGATIVE Final   Ketones, ur 06/07/2024 NEGATIVE  NEGATIVE mg/dL Final   Protein, ur 87/93/7974 NEGATIVE  NEGATIVE mg/dL Final   Nitrite 87/93/7974 NEGATIVE  NEGATIVE Final   Leukocytes,Ua 06/07/2024 NEGATIVE  NEGATIVE Final   Performed at [facility], [address], [place], [place] [address]  Admission on 06/02/2024, Discharged on 06/03/2024  Component Date Value Ref Range Status   Sodium 06/02/2024 141  135 - 145 mmol/L Final    Potassium 06/02/2024 3.6  3.5 - 5.1 mmol/L Final   Chloride 06/02/2024 107  98 - 111 mmol/L Final   CO2 06/02/2024 27  22 - 32 mmol/L Final   Glucose, Bld 06/02/2024 82  70 - 99 mg/dL Final   Glucose reference range applies only to samples taken after fasting for at least 8 hours.   BUN 06/02/2024 14  6 - 20 mg/dL Final   Creatinine, Ser 06/02/2024 1.16  0.61 - 1.24 mg/dL Final   Calcium 87/98/7974 8.7 (L)  8.9 - 10.3 mg/dL Final   Total Protein 87/98/7974 6.3 (L)  6.5 - 8.1 g/dL Final   Albumin 87/98/7974 3.7  3.5 - 5.0 g/dL Final   AST 87/98/7974 26  15 - 41 U/L Final   ALT 06/02/2024 18  0 - 44 U/L Final   Alkaline Phosphatase 06/02/2024 59  38 - 126 U/L Final   Total Bilirubin 06/02/2024 0.7  0.0 - 1.2 mg/dL Final   GFR, Estimated 06/02/2024 >60  >60 mL/min Final   Comment: (NOTE) Calculated using the CKD-EPI Creatinine Equation (2021)    Anion gap 06/02/2024 7  5 - 15 Final   Performed at [facility], [address] [place], [place] [address]   Alcohol, Ethyl (B) 06/02/2024 <15  <15 mg/dL Final   Comment: (NOTE) For medical purposes only. Performed at [facility], [address] [place], [place] [address]    WBC 06/02/2024 8.1  4.0 - 10.5 K/uL Final   RBC 06/02/2024 5.22  4.22 - 5.81 MIL/uL Final   Hemoglobin 06/02/2024 13.5  13.0 - 17.0 g/dL Final   HCT 87/98/7974 43.3  39.0 - 52.0 % Final   MCV 06/02/2024 83.0  80.0 - 100.0 fL Final   MCH 06/02/2024 25.9 (L)  26.0 - 34.0 pg Final   MCHC 06/02/2024 31.2  30.0 - 36.0 g/dL Final   RDW 87/98/7974 13.8  11.5 - 15.5 % Final   Platelets 06/02/2024 220  150 - 400 K/uL Final   nRBC 06/02/2024 0.0  0.0 - 0.2 % Final   Performed at [facility], [address] [place], [place] [address]   Opiates 06/02/2024 NONE DETECTED  NONE DETECTED Final   Cocaine 06/02/2024 NONE DETECTED  NONE DETECTED Final   Benzodiazepines 06/02/2024 NONE DETECTED  NONE DETECTED Final   Amphetamines 06/02/2024 NONE DETECTED  NONE  DETECTED Final   Tetrahydrocannabinol 06/02/2024 NONE DETECTED  NONE DETECTED Final   Barbiturates 06/02/2024 NONE DETECTED  NONE DETECTED Final   Comment: (NOTE) DRUG SCREEN FOR MEDICAL PURPOSES ONLY.  IF CONFIRMATION IS NEEDED FOR ANY PURPOSE, NOTIFY LAB WITHIN 5 DAYS.  LOWEST DETECTABLE LIMITS FOR URINE DRUG SCREEN Drug Class                     Cutoff (ng/mL) Amphetamine and metabolites    1000 Barbiturate and metabolites    200 Benzodiazepine                 200 Opiates and metabolites        300 Cocaine and metabolites        300 THC                            50 Performed at [facility], [address] [place], [place] [address]   Admission on 05/06/2024, Discharged on 05/06/2024  Component Date Value Ref Range Status   Sodium 05/06/2024 138  135 - 145 mmol/L Final   Potassium 05/06/2024 3.7  3.5 - 5.1 mmol/L Final   Chloride 05/06/2024 104  98 - 111 mmol/L Final   CO2 05/06/2024 25  22 - 32 mmol/L Final   Glucose, Bld 05/06/2024 89  70 - 99 mg/dL Final   Glucose reference range applies only to samples taken after fasting for at least 8 hours.   BUN 05/06/2024 12  6 - 20 mg/dL Final   Creatinine, Ser 05/06/2024 1.25 (H)  0.61 - 1.24 mg/dL Final   Calcium 88/95/7974 8.9  8.9 - 10.3 mg/dL Final   Total Protein 88/95/7974 6.3 (L)  6.5 - 8.1 g/dL Final   Albumin 88/95/7974 3.6  3.5 - 5.0 g/dL Final   AST 88/95/7974 28  15 - 41 U/L Final   ALT 05/06/2024 20  0 - 44 U/L Final   Alkaline Phosphatase 05/06/2024 54  38 - 126 U/L Final   Total Bilirubin 05/06/2024 0.6  0.0 - 1.2 mg/dL Final   GFR, Estimated 05/06/2024 >60  >60 mL/min Final   Comment: (NOTE) Calculated using the CKD-EPI Creatinine Equation (2021)    Anion gap 05/06/2024 9  5 - 15 Final   Performed at [facility], [address], [place], [place] [address]   Alcohol, Ethyl (B) 05/06/2024 <15  <15 mg/dL  Final   Comment: (NOTE) For medical purposes only. Performed at [facility],  [address], [place], [place] [address]    WBC 05/06/2024 9.6  4.0 - 10.5 K/uL Final   RBC 05/06/2024 5.03  4.22 - 5.81 MIL/uL Final   Hemoglobin 05/06/2024 12.9 (L)  13.0 - 17.0 g/dL Final   HCT 88/95/7974 41.0  39.0 - 52.0 % Final   MCV 05/06/2024 81.5  80.0 - 100.0 fL Final   MCH 05/06/2024 25.6 (L)  26.0 - 34.0 pg Final   MCHC 05/06/2024 31.5  30.0 - 36.0 g/dL Final   RDW 88/95/7974 13.5  11.5 - 15.5 % Final   Platelets 05/06/2024 265  150 - 400 K/uL Final   nRBC 05/06/2024 0.0  0.0 - 0.2 % Final   Performed at [facility], [address], [place], [place] [address]   Opiates 05/06/2024 NONE DETECTED  NONE DETECTED Final   Cocaine 05/06/2024 POSITIVE (A)  NONE DETECTED Final   Benzodiazepines 05/06/2024 NONE DETECTED  NONE DETECTED Final   Amphetamines 05/06/2024 NONE DETECTED  NONE DETECTED Final   Tetrahydrocannabinol 05/06/2024 NONE DETECTED  NONE DETECTED Final   Barbiturates 05/06/2024 NONE DETECTED  NONE DETECTED Final   Comment: (NOTE) DRUG SCREEN FOR MEDICAL PURPOSES ONLY.  IF CONFIRMATION IS NEEDED FOR ANY PURPOSE, NOTIFY LAB WITHIN 5 DAYS.  LOWEST DETECTABLE LIMITS FOR URINE DRUG SCREEN Drug Class                     Cutoff (ng/mL) Amphetamine and metabolites    1000 Barbiturate and metabolites    200 Benzodiazepine                 200 Opiates and metabolites        300 Cocaine and metabolites        300 THC                            50 Performed at [facility], [address], [place], [place] [address]   Admission on 05/03/2024, Discharged on 05/03/2024  Component Date Value Ref Range Status   SARS Coronavirus 2 by RT PCR 05/03/2024 NEGATIVE  NEGATIVE Final   Influenza A by PCR 05/03/2024 NEGATIVE  NEGATIVE Final   Influenza B by PCR 05/03/2024 NEGATIVE  NEGATIVE Final   Comment: (NOTE) The Xpert Xpress SARS-CoV-2/FLU/RSV plus assay is intended as an aid in the diagnosis of influenza from Nasopharyngeal swab specimens and should not be used  as a sole basis for treatment. Nasal washings and aspirates are unacceptable for Xpert Xpress SARS-CoV-2/FLU/RSV testing.  Fact Sheet for Patients: bloggercourse.com  Fact Sheet for Healthcare Providers: seriousbroker.it  This test is not yet approved or cleared by the [place]  FDA and has been authorized for detection and/or diagnosis of SARS-CoV-2 by FDA under an Emergency Use Authorization (EUA). This EUA will remain in effect (meaning this test can be used) for the duration of the COVID-19 declaration under Section 564(b)(1) of the Act, 21 U.S.C. section 360bbb-3(b)(1), unless the authorization is terminated or revoked.     Resp Syncytial Virus by PCR 05/03/2024 NEGATIVE  NEGATIVE Final   Comment: (NOTE) Fact Sheet for Patients: bloggercourse.com  Fact Sheet for Healthcare Providers: seriousbroker.it  This test is not yet approved or cleared by the [place]  FDA and has been authorized for detection and/or diagnosis of SARS-CoV-2 by FDA under an Emergency Use  Authorization (EUA). This EUA will remain in effect (meaning this test can be used) for the duration of the COVID-19 declaration under Section 564(b)(1) of the Act, 21 U.S.C. section 360bbb-3(b)(1), unless the authorization is terminated or revoked.  Performed at [facility], [address] [place], [place] [address]   Admission on 05/01/2024, Discharged on 05/02/2024  Component Date Value Ref Range Status   Sodium 05/01/2024 137  135 - 145 mmol/L Final   Potassium 05/01/2024 4.0  3.5 - 5.1 mmol/L Final   Chloride 05/01/2024 103  98 - 111 mmol/L Final   CO2 05/01/2024 23  22 - 32 mmol/L Final   Glucose, Bld 05/01/2024 98  70 - 99 mg/dL Final   Glucose reference range applies only to samples taken after fasting for at least 8 hours.   BUN 05/01/2024 27 (H)  6 - 20 mg/dL Final   Creatinine, Ser  05/01/2024 1.36 (H)  0.61 - 1.24 mg/dL Final   Calcium 89/69/7974 8.8 (L)  8.9 - 10.3 mg/dL Final   Total Protein 89/69/7974 5.8 (L)  6.5 - 8.1 g/dL Final   Albumin 89/69/7974 3.5  3.5 - 5.0 g/dL Final   AST 89/69/7974 46 (H)  15 - 41 U/L Final   ALT 05/01/2024 25  0 - 44 U/L Final   Alkaline Phosphatase 05/01/2024 59  38 - 126 U/L Final   Total Bilirubin 05/01/2024 0.7  0.0 - 1.2 mg/dL Final   GFR, Estimated 05/01/2024 >60  >60 mL/min Final   Comment: (NOTE) Calculated using the CKD-EPI Creatinine Equation (2021)    Anion gap 05/01/2024 11  5 - 15 Final   Performed at [facility], [address] [place], [place] [address]   Alcohol, Ethyl (B) 05/01/2024 <15  <15 mg/dL Final   Comment: (NOTE) For medical purposes only. Performed at [facility], [address], [place], [place] [address]    WBC 05/01/2024 10.9 (H)  4.0 - 10.5 K/uL Final   RBC 05/01/2024 5.01  4.22 - 5.81 MIL/uL Final   Hemoglobin 05/01/2024 13.0  13.0 - 17.0 g/dL Final   HCT 89/69/7974 40.9  39.0 - 52.0 % Final   MCV 05/01/2024 81.6  80.0 - 100.0 fL Final   MCH 05/01/2024 25.9 (L)  26.0 - 34.0 pg Final   MCHC 05/01/2024 31.8  30.0 - 36.0 g/dL Final   RDW 89/69/7974 13.9  11.5 - 15.5 % Final   Platelets 05/01/2024 248  150 - 400 K/uL Final   nRBC 05/01/2024 0.0  0.0 - 0.2 % Final   Neutrophils Relative % 05/01/2024 59  % Final   Neutro Abs 05/01/2024 6.5  1.7 - 7.7 K/uL Final   Lymphocytes Relative 05/01/2024 25  % Final   Lymphs Abs 05/01/2024 2.7  0.7 - 4.0 K/uL Final   Monocytes Relative 05/01/2024 11  % Final   Monocytes Absolute 05/01/2024 1.2 (H)  0.1 - 1.0 K/uL Final   Eosinophils Relative 05/01/2024 3  % Final   Eosinophils Absolute 05/01/2024 0.4  0.0 - 0.5 K/uL Final   Basophils Relative 05/01/2024 1  % Final   Basophils Absolute 05/01/2024 0.1  0.0 - 0.1 K/uL Final   Immature Granulocytes 05/01/2024 1  % Final   Abs Immature Granulocytes 05/01/2024 0.05  0.00 - 0.07 K/uL Final    Performed at [facility], [address] [place], [place] [address]  Admission on 04/22/2024, Discharged on 04/24/2024  Component Date Value Ref Range  Status   Opiates 04/23/2024 NEGATIVE  NEGATIVE Final   Cocaine 04/23/2024 POSITIVE (A)  NEGATIVE Final   Benzodiazepines 04/23/2024 NEGATIVE  NEGATIVE Final   Amphetamines 04/23/2024 NEGATIVE  NEGATIVE Final   Tetrahydrocannabinol 04/23/2024 NEGATIVE  NEGATIVE Final   Barbiturates 04/23/2024 NEGATIVE  NEGATIVE Final   Methadone Scn, Ur 04/23/2024 NEGATIVE  NEGATIVE Final   Fentanyl  04/23/2024 NEGATIVE  NEGATIVE Final   Comment: (NOTE) Drug screen is for Medical Purposes only. Positive results are preliminary only. If confirmation is needed, notify lab within 5 days.  Drug Class                 Cutoff (ng/mL) Amphetamine and metabolites 1000 Barbiturate and metabolites 200 Benzodiazepine              200 Opiates and metabolites     300 Cocaine and metabolites     300 THC                         50 Fentanyl                     5 Methadone                   300  Trazodone  is metabolized in vivo to several metabolites,  including pharmacologically active m-CPP, which is excreted in the  urine.  Immunoassay screens for amphetamines and MDMA have potential  cross-reactivity with these compounds and may provide false positive  result.  Performed at [facility], [address] [place], [place] [address]    WBC 04/22/2024 13.1 (H)  4.0 - 10.5 K/uL Final   RBC 04/22/2024 5.51  4.22 - 5.81 MIL/uL Final   Hemoglobin 04/22/2024 14.2  13.0 - 17.0 g/dL Final   HCT 89/78/7974 44.8  39.0 - 52.0 % Final   MCV 04/22/2024 81.3  80.0 - 100.0 fL Final   MCH 04/22/2024 25.8 (L)  26.0 - 34.0 pg Final   MCHC 04/22/2024 31.7  30.0 - 36.0 g/dL Final   RDW 89/78/7974 13.6  11.5 - 15.5 % Final   Platelets 04/22/2024 284  150 - 400 K/uL Final   nRBC 04/22/2024 0.0  0.0 - 0.2 % Final   Neutrophils Relative % 04/22/2024 71  %  Final   Neutro Abs 04/22/2024 9.3 (H)  1.7 - 7.7 K/uL Final   Lymphocytes Relative 04/22/2024 18  % Final   Lymphs Abs 04/22/2024 2.3  0.7 - 4.0 K/uL Final   Monocytes Relative 04/22/2024 10  % Final   Monocytes Absolute 04/22/2024 1.3 (H)  0.1 - 1.0 K/uL Final   Eosinophils Relative 04/22/2024 0  % Final   Eosinophils Absolute 04/22/2024 0.0  0.0 - 0.5 K/uL Final   Basophils Relative 04/22/2024 1  % Final   Basophils Absolute 04/22/2024 0.1  0.0 - 0.1 K/uL Final   Immature Granulocytes 04/22/2024 0  % Final   Abs Immature Granulocytes 04/22/2024 0.05  0.00 - 0.07 K/uL Final   Performed at [facility], [address] [place], [place] [address]   Sodium 04/22/2024 138  135 - 145 mmol/L Final   Potassium 04/22/2024 4.4  3.5 - 5.1 mmol/L Final   Chloride 04/22/2024 99  98 - 111 mmol/L Final   CO2 04/22/2024 23  22 - 32 mmol/L Final   Glucose, Bld 04/22/2024 73  70 - 99 mg/dL Final   Glucose reference range applies only to samples  taken after fasting for at least 8 hours.   BUN 04/22/2024 20  6 - 20 mg/dL Final   Creatinine, Ser 04/22/2024 1.46 (H)  0.61 - 1.24 mg/dL Final   Calcium 89/78/7974 10.2  8.9 - 10.3 mg/dL Final   GFR, Estimated 04/22/2024 >60  >60 mL/min Final   Comment: (NOTE) Calculated using the CKD-EPI Creatinine Equation (2021)    Anion gap 04/22/2024 15  5 - 15 Final   Performed at [facility], [address] [place], [place] [address]   Alcohol, Ethyl (B) 04/22/2024 <15  <15 mg/dL Final   Comment: (NOTE) For medical purposes only. Performed at [facility], [address] [place], [place] [address]   Admission on 04/19/2024, Discharged on 04/21/2024  Component Date Value Ref Range Status   Sodium 04/20/2024 136  135 - 145 mmol/L Final   Potassium 04/20/2024 3.9  3.5 - 5.1 mmol/L Final   Chloride 04/20/2024 101  98 - 111 mmol/L Final   CO2 04/20/2024 26  22 - 32 mmol/L Final   Glucose, Bld 04/20/2024 86   70 - 99 mg/dL Final   Glucose reference range applies only to samples taken after fasting for at least 8 hours.   BUN 04/20/2024 12  6 - 20 mg/dL Final   Creatinine, Ser 04/20/2024 1.26 (H)  0.61 - 1.24 mg/dL Final   Calcium 89/80/7974 8.9  8.9 - 10.3 mg/dL Final   Total Protein 89/80/7974 5.8 (L)  6.5 - 8.1 g/dL Final   Albumin 89/80/7974 3.4 (L)  3.5 - 5.0 g/dL Final   AST 89/80/7974 25  15 - 41 U/L Final   ALT 04/20/2024 19  0 - 44 U/L Final   Alkaline Phosphatase 04/20/2024 50  38 - 126 U/L Final   Total Bilirubin 04/20/2024 0.7  0.0 - 1.2 mg/dL Final   GFR, Estimated 04/20/2024 >60  >60 mL/min Final   Comment: (NOTE) Calculated using the CKD-EPI Creatinine Equation (2021)    Anion gap 04/20/2024 9  5 - 15 Final   Performed at [facility], [address] [place], [place] [address]   Alcohol, Ethyl (B) 04/20/2024 <15  <15 mg/dL Final   Comment: (NOTE) For medical purposes only. Performed at [facility], [address], [place], [place] [address]    Opiates 04/20/2024 NONE DETECTED  NONE DETECTED Final   Cocaine 04/20/2024 POSITIVE (A)  NONE DETECTED Final   Benzodiazepines 04/20/2024 NONE DETECTED  NONE DETECTED Final   Amphetamines 04/20/2024 NONE DETECTED  NONE DETECTED Final   Tetrahydrocannabinol 04/20/2024 NONE DETECTED  NONE DETECTED Final   Barbiturates 04/20/2024 NONE DETECTED  NONE DETECTED Final   Comment: (NOTE) DRUG SCREEN FOR MEDICAL PURPOSES ONLY.  IF CONFIRMATION IS NEEDED FOR ANY PURPOSE, NOTIFY LAB WITHIN 5 DAYS.  LOWEST DETECTABLE LIMITS FOR URINE DRUG SCREEN Drug Class                     Cutoff (ng/mL) Amphetamine and metabolites    1000 Barbiturate and metabolites    200 Benzodiazepine                 200 Opiates and metabolites        300 Cocaine and metabolites        300 THC                            50 Performed at [facility], [address] [place], [place] [address]    WBC 04/20/2024 7.9  4.0 - 10.5 K/uL Final    RBC 04/20/2024 5.75  4.22 - 5.81 MIL/uL Final   Hemoglobin 04/20/2024 14.9  13.0 - 17.0 g/dL Final   HCT 89/80/7974 47.2  39.0 - 52.0 % Final   MCV 04/20/2024 82.1  80.0 - 100.0 fL Final   MCH 04/20/2024 25.9 (L)  26.0 - 34.0 pg Final   MCHC 04/20/2024 31.6  30.0 - 36.0 g/dL Final   RDW 89/80/7974 13.6  11.5 - 15.5 % Final   Platelets 04/20/2024 266  150 - 400 K/uL Final   nRBC 04/20/2024 0.0  0.0 - 0.2 % Final   Neutrophils Relative % 04/20/2024 61  % Final   Neutro Abs 04/20/2024 4.8  1.7 - 7.7 K/uL Final   Lymphocytes Relative 04/20/2024 24  % Final   Lymphs Abs 04/20/2024 1.9  0.7 - 4.0 K/uL Final   Monocytes Relative 04/20/2024 9  % Final   Monocytes Absolute 04/20/2024 0.7  0.1 - 1.0 K/uL Final   Eosinophils Relative 04/20/2024 5  % Final   Eosinophils Absolute 04/20/2024 0.4  0.0 - 0.5 K/uL Final   Basophils Relative 04/20/2024 1  % Final   Basophils Absolute 04/20/2024 0.1  0.0 - 0.1 K/uL Final   Immature Granulocytes 04/20/2024 0  % Final   Abs Immature Granulocytes 04/20/2024 0.03  0.00 - 0.07 K/uL Final   Performed at [facility], [address] [place], [place] [address]  Admission on 04/15/2024, Discharged on 04/16/2024  Component Date Value Ref Range Status   Alcohol, Ethyl (B) 04/15/2024 <15  <15 mg/dL Final   Comment: (NOTE) For medical purposes only. Performed at [facility], [address] [place], [place] [address]    TSH 04/15/2024 0.945  0.350 - 4.500 uIU/mL Final   Comment: Performed by a 3rd Generation assay with a functional sensitivity of <=0.01 uIU/mL. Performed at [facility], [address] [place], [place] [address]    Sodium 04/15/2024 139  135 - 145 mmol/L Final   Potassium 04/15/2024 4.3  3.5 - 5.1 mmol/L Final   Chloride 04/15/2024 105  98 - 111 mmol/L Final   CO2 04/15/2024 26  22 - 32 mmol/L Final   Glucose, Bld 04/15/2024 89  70 - 99 mg/dL Final   Glucose reference range applies only to samples taken after fasting for  at least 8 hours.   BUN 04/15/2024 20  6 - 20 mg/dL Final   Creatinine, Ser 04/15/2024 1.23  0.61 - 1.24 mg/dL Final   Calcium 89/85/7974 8.7 (L)  8.9 - 10.3 mg/dL Final   Total Protein 89/85/7974 5.1 (L)  6.5 - 8.1 g/dL Final   Albumin 89/85/7974 3.1 (L)  3.5 - 5.0 g/dL Final   AST 89/85/7974 29  15 - 41 U/L Final   ALT 04/15/2024 21  0 - 44 U/L Final   Alkaline Phosphatase 04/15/2024 55  38 - 126 U/L Final   Total Bilirubin 04/15/2024 0.3  0.0 - 1.2 mg/dL Final   GFR, Estimated 04/15/2024 >60  >60 mL/min Final   Comment: (NOTE) Calculated using the CKD-EPI Creatinine Equation (2021)    Anion gap 04/15/2024 8  5 - 15 Final   Performed at [facility], [address], [place], [place] [address]   WBC 04/15/2024 7.6  4.0 - 10.5 K/uL Final   RBC 04/15/2024 4.97  4.22 -  5.81 MIL/uL Final   Hemoglobin 04/15/2024 12.9 (L)  13.0 - 17.0 g/dL Final   HCT 89/85/7974 40.7  39.0 - 52.0 % Final   MCV 04/15/2024 81.9  80.0 - 100.0 fL Final   MCH 04/15/2024 26.0  26.0 - 34.0 pg Final   MCHC 04/15/2024 31.7  30.0 - 36.0 g/dL Final   RDW 89/85/7974 13.7  11.5 - 15.5 % Final   Platelets 04/15/2024 239  150 - 400 K/uL Final   nRBC 04/15/2024 0.0  0.0 - 0.2 % Final   Neutrophils Relative % 04/15/2024 51  % Final   Neutro Abs 04/15/2024 3.8  1.7 - 7.7 K/uL Final   Lymphocytes Relative 04/15/2024 32  % Final   Lymphs Abs 04/15/2024 2.4  0.7 - 4.0 K/uL Final   Monocytes Relative 04/15/2024 9  % Final   Monocytes Absolute 04/15/2024 0.7  0.1 - 1.0 K/uL Final   Eosinophils Relative 04/15/2024 7  % Final   Eosinophils Absolute 04/15/2024 0.6 (H)  0.0 - 0.5 K/uL Final   Basophils Relative 04/15/2024 1  % Final   Basophils Absolute 04/15/2024 0.1  0.0 - 0.1 K/uL Final   Immature Granulocytes 04/15/2024 0  % Final   Abs Immature Granulocytes 04/15/2024 0.02  0.00 - 0.07 K/uL Final   Performed at [facility], [address] [place], [place] [address]   POC Amphetamine UR 04/15/2024 None  Detected  NONE DETECTED (Cut Off Level 1000 ng/mL) Final   POC Secobarbital (BAR) 04/15/2024 None Detected  NONE DETECTED (Cut Off Level 300 ng/mL) Final   POC Buprenorphine (BUP) 04/15/2024 None Detected  NONE DETECTED (Cut Off Level 10 ng/mL) Final   POC Oxazepam (BZO) 04/15/2024 None Detected (A)  NONE DETECTED (Cut Off Level 300 ng/mL) Final   POC Cocaine UR 04/15/2024 Positive (A)  NONE DETECTED (Cut Off Level 300 ng/mL) Final   POC Methamphetamine UR 04/15/2024 None Detected  NONE DETECTED (Cut Off Level 1000 ng/mL) Final   POC Morphine 04/15/2024 None Detected  NONE DETECTED (Cut Off Level 300 ng/mL) Final   POC Methadone UR 04/15/2024 None Detected  NONE DETECTED (Cut Off Level 300 ng/mL) Final   POC Oxycodone UR 04/15/2024 None Detected  NONE DETECTED (Cut Off Level 100 ng/mL) Final   POC Marijuana UR 04/15/2024 None Detected  NONE DETECTED (Cut Off Level 50 ng/mL) Final  There may be more visits with results that are not included.    Allergies: Patient has no known allergies.  Medications:  Facility Ordered Medications  Medication   acetaminophen  (TYLENOL ) tablet 650 mg   alum & mag hydroxide-simeth (MAALOX/MYLANTA) 200-200-20 MG/5ML suspension 30 mL   magnesium  hydroxide (MILK OF MAGNESIA) suspension 30 mL   haloperidol  (HALDOL ) tablet 5 mg   And   diphenhydrAMINE  (BENADRYL ) capsule 50 mg   haloperidol  lactate (HALDOL ) injection 5 mg   And   diphenhydrAMINE  (BENADRYL ) injection 50 mg   And   LORazepam  (ATIVAN ) injection 2 mg   haloperidol  lactate (HALDOL ) injection 10 mg   And   diphenhydrAMINE  (BENADRYL ) injection 50 mg   And   LORazepam  (ATIVAN ) injection 2 mg   traZODone  (DESYREL ) tablet 50 mg   hydrOXYzine  (ATARAX ) tablet 25 mg   risperiDONE  (RISPERDAL  M-TABS) disintegrating tablet 2 mg   risperiDONE  (RISPERDAL  M-TABS) disintegrating tablet 1 mg   PTA Medications  Medication Sig   risperiDONE  (RISPERDAL ) 1 MG tablet Take 1 tablet (1 mg total) by mouth at  bedtime.  Medical Decision Making  Taniela M. Prime is a 43 y/o male with a history of schizophrenia unspecified, homelessness, malingering, substance induced mood disorder, schizoaffective disorder, hallucinations presenting to [facility] as a walk in voluntarily unaccompanied with complaints of wanting to get back on his medications.     Recommendations  Based on my evaluation the patient does not appear to have an emergency medical condition. Patient will be admitted to [facility] continuous observation for crisis management, safety and stabilization.  Sanyah Molnar E Tyechia Allmendinger, NP 06/29/2024  6:49 AM

## 2024-06-28 NOTE — Progress Notes (Signed)
" °   06/28/24 1846  [facility] Triage Screening (Walk-ins at [facility] only)  How Did You Hear About Us ? Self  What Is the Reason for Your Visit/Call Today? Aaron Rubio is a 72Y male presenting to [facility] as a vol walk-in. Pt initally refused assessment and left the building. After throwing away items Pt returned to building to engage in assessment. Pt states he is here today because he has not showered in 3 months and is tired of having to look in trash cans. Pt struggles to answer triage questions instead asks for lemonade and gatorade. Pt denies SI, HI, and substance use. When asked about AH/VH patients replies that a doozy.  How Long Has This Been Causing You Problems? <Week  Have You Recently Had Any Thoughts About Hurting Yourself? No  Are You Planning to Commit Suicide/Harm Yourself At This time? No  Have you Recently Had Thoughts About Hurting Someone [name]? No  Are You Planning To Harm Someone At This Time? No  Physical Abuse Denies  Verbal Abuse Denies  Sexual Abuse Denies  Exploitation of patient/patient's resources Denies  Are you currently experiencing any auditory, visual or other hallucinations?  (UTA)  Have You Used Any Alcohol or Drugs in the Past 24 Hours? No  Do you have any current medical co-morbidities that require immediate attention? No  What Do You Feel Would Help You the Most Today? Housing Assistance;Food Assistance;Financial Resources  If access to [facility] was not available, would you have sought care in the Emergency Department? Yes  Determination of Need Routine (7 days)  Options For Referral [facility]    "
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
## 2024-06-28 NOTE — ED Notes (Signed)
 Writer called Pt for triage assessment and informed Pt he would have to empty his pockets before triage. Pt then got up and left the building. Pt was seen by security throwing away items in his pockets. Pt shortly returned and agreed to complete assessment.

## 2024-06-28 NOTE — ED Notes (Signed)
 Patient is alert and oriented x 4 with no acute distress noted.  Patient currently denies SI/HI, however when asked about AH/VH patient did not answer.  Patient has been observed talking and laughing to himself.  Skin assessment completed during shift.  Patient has no complaints of pain or discomfort at this time.  When pt. Arrived to the unit he immediately asked for something to eat and drink, and also asked for shower supplies.  Food and drink offered during shift.  Patient requested to take a shower, and shower supplies given.  No issues noted or concerns voiced at this time.  Patient will continue Q 15 min safety checks for safety/behavior per facility protocol.

## 2024-06-29 ENCOUNTER — Emergency Department (HOSPITAL_COMMUNITY)

## 2024-06-29 ENCOUNTER — Emergency Department (HOSPITAL_COMMUNITY)
Admission: EM | Admit: 2024-06-29 | Discharge: 2024-06-30 | Disposition: A | Discharge status: Home / Self Care | Attending: Emergency Medicine | Admitting: Emergency Medicine

## 2024-06-29 ENCOUNTER — Encounter (HOSPITAL_COMMUNITY): Payer: Self-pay

## 2024-06-29 ENCOUNTER — Other Ambulatory Visit: Payer: Self-pay

## 2024-06-29 DIAGNOSIS — Z765 Malingerer [conscious simulation]: Secondary | ICD-10-CM | POA: Insufficient documentation

## 2024-06-29 DIAGNOSIS — M79671 Pain in right foot: Secondary | ICD-10-CM | POA: Insufficient documentation

## 2024-06-29 DIAGNOSIS — Z5900 Homelessness unspecified: Secondary | ICD-10-CM | POA: Diagnosis not present

## 2024-06-29 DIAGNOSIS — M79672 Pain in left foot: Secondary | ICD-10-CM | POA: Diagnosis not present

## 2024-06-29 LAB — POCT URINE DRUG SCREEN - MANUAL ENTRY (I-SCREEN)
POC Amphetamine UR: POSITIVE — AB
POC Buprenorphine (BUP): NOT DETECTED
POC Cocaine UR: POSITIVE — AB
POC Marijuana UR: POSITIVE — AB
POC Methadone UR: NOT DETECTED
POC Methamphetamine UR: NOT DETECTED
POC Morphine: NOT DETECTED
POC Oxazepam (BZO): POSITIVE — AB
POC Oxycodone UR: NOT DETECTED
POC Secobarbital (BAR): NOT DETECTED

## 2024-06-29 MED ORDER — RISPERIDONE 1 MG PO TBDP: 1.0000 mg | ORAL_TABLET | Freq: Every day | ORAL | 0 refills | Status: DC

## 2024-06-29 MED ORDER — RISPERIDONE 2 MG PO TBDP: 2.0000 mg | ORAL_TABLET | Freq: Every day | ORAL | 0 refills | Status: DC

## 2024-06-29 NOTE — Discharge Instructions (Addendum)
 Local Inpatient Options for Substance Abuse Treatment    Includes links to the following:  Short-Term: [facility] (techcelebrity.com.pt) [facility] (https://ringercenters.com/) [facility]'s [facility] Based Crisis Center (http://wilson-mayo.com/)  Medium Term: [facility] (dyecasts.nl) [facility] (Medicaid or Uninsured only) (https://www.[facility].org/locations/[place])  Longer Residential Options: [facility] (mediasuits.se) [facility] ([facility]) - [facility] (https://baker.biz/) ___ Outpatient Therapy and Psychiatry Resources for Patients: Your psychiatric needs would be well-served by consultation and regular meetings with an outpatient therapist to assist you with your mood-related conditions. Here are a series of links for finding a therapist.    Includes links to the following: [facility] (http://wilson-mayo.com/) (only for [facility] and please reserve for uninsured) [facility] (http://blankenship-martinez.net/) Psychology Today Special Educational Needs Teacher (https://www.psychologytoday.com/us lendell) Psychology Today Support Group Tax Inspector (https://www.psychologytoday.com/us /groups/) [employer] - [employer] Location (https://carolinabehavioralcare.com/staff-location/[place]/) [facility] - Support Group Finder - (recorddebt.fi) [facility] [employer] - Lexicographer  (https://fspcares.org/contact/) [employer] for Mental Health [place] - NAMI (https://namigu[place].org/support-and-education/support-groups/) [employer] - Affiliated with [facility] (https://www.Mont[place].com/lb/locations/profile/[facility]-[place]-[facility]-med[facility]-at-[address]-reed-drive/) Dept of Health and Human Services - Find a mental health facility (http://lester.info/) ____ SUBSTANCE USE TREATMENT for Medicaid and State Funded/IPRS  [facility] [address] [place], [place], [address] [phone] phone NOTE: [facility] is no longer offering IOP services.  Serves those who are low-income or have no insurance.  [facility] [address], [place], [place], [address] [phone] phone NOTE: Does have Substance Abuse-Intensive Outpatient Program [facility]) as well as transitional housing if eligible.  [facility] [address] [place], [place], [address] [phone] phone [phone] fax  [facility] [phone] [place], [place], [address] [phone] phone [phone] fax   [facility] (men only at this campus) [address] [place], [place] [address] [phone]  ((These programs listed above have a one-time application fee.))  [address], [place] [address] [phone]  [facility] [phone] [address] [place] [address] [phone] II MYRTIS CLEMENTEEN [address], [place] [address] [phone], [place] [address] [phone]  [facility] (Rehab for men only) [address] [place], [place] [address] [phone] ___ Shelters  [facility] [address], [place], [place] [address] [phone] Population served: Adult men & women (28 years old and older, able to perform  activities for daily living) Documents required: Valid ID & Social Security Card  [facility] [address], [place], [place] [address] [phone] Population served: Males 18+ Documents required: Valid ID & Social Security Card  [facility] [address], [place], [place] [address] [phone] Population served: Men 18+, preference for disabled and/or veterans Eligibility: By referral only

## 2024-06-29 NOTE — ED Notes (Signed)
 Pt called x2 fr vitals recheck. Pt could not be found.

## 2024-06-29 NOTE — ED Provider Notes (Incomplete)
 [facility] Continuous Assessment Admission H&P  Date: 06/28/2024 Patient Name: Aaron Rubio MRN: [id number] Chief Complaint:   Diagnoses:  Final diagnoses:  None    YEP:Ojfnwu M. Nims is a 43 y/o male presenting to [facility] as a walk in voluntarily unaccompanied with complaints of wanting to get back on his medications.     During evaluation Javian Nudd is ***(position) in no acute distress.  ***He/She is alert, oriented x 4, calm, cooperative and attentive.  ***His/Her mood is ***euthymic with congruent affect.  ***He/She has normal speech, and behavior.  Objectively there is no evidence of psychosis/mania or delusional thinking.  Patient is able to converse coherently, goal directed thoughts, no distractibility, or pre-occupation.  ***He/She also denies suicidal/self-harm/homicidal ideation, psychosis, and paranoia.  Patient answered question appropriately.     Total Time spent with patient: 20 minutes  Musculoskeletal  Strength & Muscle Tone: within normal limits Gait & Station: normal Patient leans: N/A  Psychiatric Specialty Exam  Presentation General Appearance:  Disheveled  Eye Contact: Fair  Speech: Normal Rate  Speech Volume: Normal  Handedness: Right   Mood and Affect  Mood: Labile  Affect: Flat   Thought Process  Thought Processes: Disorganized  Descriptions of Associations:Tangential  Orientation:Partial  Thought Content:Scattered; Illogical  Diagnosis of Schizophrenia or Schizoaffective disorder in past: Yes  Duration of Psychotic Symptoms: Greater than six months  Hallucinations:Hallucinations: None  Ideas of Reference:None  Suicidal Thoughts:Suicidal Thoughts: No SI Active Intent and/or Plan: Without Intent; Without Plan SI Passive Intent and/or Plan: Without Intent; Without Plan  Homicidal Thoughts:Homicidal Thoughts: No HI Active Intent and/or Plan: Without Plan; Without Intent   Sensorium   Memory: Immediate Poor; Recent Poor; Remote Poor  Judgment: Poor  Insight: Poor   Executive Functions  Concentration: Poor  Attention Span: Poor  Recall: Poor  Fund of Knowledge: Poor  Language: Poor   Psychomotor Activity  Psychomotor Activity: Psychomotor Activity: Normal   Assets  Assets: Physical Health; Resilience   Sleep  Sleep: Sleep: Fair   Nutritional Assessment (For OBS and FBC admissions only) Has the patient had a weight loss or gain of 10 pounds or more in the last 3 months?: No Has the patient had a decrease in food intake/or appetite?: No Does the patient have dental problems?: No Does the patient have eating habits or behaviors that may be indicators of an eating disorder including binging or inducing vomiting?: No Has the patient recently lost weight without trying?: 0 Has the patient been eating poorly because of a decreased appetite?: 0 Malnutrition Screening Tool Score: 0    Physical Exam HENT:     Head: Normocephalic.     Nose: Nose normal.  Cardiovascular:     Rate and Rhythm: Normal rate.  Abdominal:     General: Abdomen is flat.  Musculoskeletal:        General: Normal range of motion.     Cervical back: Normal range of motion.  Skin:    General: Skin is warm.  Neurological:     Mental Status: He is alert and oriented to person, place, and time.  Psychiatric:        Attention and Perception: He is inattentive.        Mood and Affect: Affect is labile.        Speech: Speech is tangential.        Behavior: Behavior is cooperative.        Thought Content: Thought content is delusional. Thought content  is not paranoid. Thought content does not include homicidal or suicidal ideation. Thought content does not include homicidal or suicidal plan.        Cognition and Memory: Cognition is impaired.        Judgment: Judgment is impulsive.    Review of Systems  Constitutional: Negative.   HENT: Negative.    Eyes:  Negative.   Respiratory: Negative.    Cardiovascular: Negative.   Genitourinary: Negative.   Musculoskeletal: Negative.   Skin: Negative.   Neurological: Negative.   Psychiatric/Behavioral:  Positive for depression and substance abuse.     Blood pressure (!) 140/80, pulse 88, temperature 98.3 F (36.8 C), temperature source Oral, resp. rate 18, SpO2 100%. There is no height or weight on file to calculate BMI.  Past Psychiatric History: Schizoaffective Disorder, Depressive Type, Cocaine Use Disorder    Is the patient at risk to self? {YES/NO:21197} Has the patient been a risk to self in the past 6 months? {YES/NO:21197}.    Has the patient been a risk to self within the distant past? {YES/NO:21197}  Is the patient a risk to others? {YES/NO:21197}  Has the patient been a risk to others in the past 6 months? {YES/NO:21197}  Has the patient been a risk to others within the distant past? {YES/NO:21197}  Past Medical History:  Past Medical History:  Diagnosis Date   Hypertension    Schizophrenia (HCC)      Family History: unknown  Social History:  Social History   Socioeconomic History   Marital status: Married    Spouse name: Not on file   Number of children: Not on file   Years of education: Not on file   Highest education level: Not on file  Occupational History   Not on file  Tobacco Use   Smoking status: Former    Current packs/day: 0.30    Types: Cigarettes   Smokeless tobacco: Not on file  Vaping Use   Vaping status: Every Day   Substances: Nicotine , THC  Substance and Sexual Activity   Alcohol use: Yes    Comment: occassionally   Drug use: Yes    Types: Cocaine, Marijuana    Comment: Daily; last three years   Sexual activity: Yes    Comment: Reports married with kids living in a house  Other Topics Concern   Not on file  Social History Narrative   Not on file   Social Drivers of Health   Tobacco Use: Medium Risk (06/26/2024)    Patient History    Smoking Tobacco Use: Former    Smokeless Tobacco Use: Unknown    Passive Exposure: Not on Actuary Strain: Not on file  Food Insecurity: Patient Unable To Answer (06/28/2024)   Epic    Worried About Programme Researcher, Broadcasting/film/video in the Last Year: Patient unable to answer    Ran Out of Food in the Last Year: Patient unable to answer  Recent Concern: Food Insecurity - Food Insecurity Present (04/17/2024)   Epic    Worried About Programme Researcher, Broadcasting/film/video in the Last Year: Sometimes true    [employer] of Food in the Last Year: Sometimes true  Transportation Needs: Patient Unable To Answer (06/12/2024)   Epic    Lack of Transportation (Medical): Patient unable to answer    Lack of Transportation (Non-Medical): Patient unable to answer  Recent Concern: Transportation Needs - Unmet Transportation Needs (04/17/2024)   Epic    Lack of Transportation (Medical): Yes  Lack of Transportation (Non-Medical): Yes  Physical Activity: Not on file  Stress: Not on file  Social Connections: Not on file  Intimate Partner Violence: Not At Risk (06/12/2024)   Epic    Fear of Current or Ex-Partner: No    Emotionally Abused: No    Physically Abused: No    Sexually Abused: No  Depression (PHQ2-9): Low Risk (04/16/2024)   Depression (PHQ2-9)    PHQ-2 Score: 4  Alcohol Screen: Low Risk (04/07/2024)   Alcohol Screen    Last Alcohol Screening Score (AUDIT): 0  Housing: High Risk (04/07/2024)   Epic    Unable to Pay for Housing in the Last Year: Yes    Number of Times Moved in the Last Year: 2    Homeless in the Last Year: Yes  Utilities: Patient Unable To Answer (06/12/2024)   Epic    Threatened with loss of utilities: Patient unable to answer  Health Literacy: Not on file     Last Labs:  Admission on 06/28/2024  Component Date Value Ref Range Status   WBC 06/28/2024 9.6  4.0 - 10.5 K/uL Final   RBC 06/28/2024 5.39  4.22 - 5.81 MIL/uL Final   Hemoglobin  06/28/2024 14.3  13.0 - 17.0 g/dL Final   HCT 87/72/7974 43.9  39.0 - 52.0 % Final   MCV 06/28/2024 81.4  80.0 - 100.0 fL Final   MCH 06/28/2024 26.5  26.0 - 34.0 pg Final   MCHC 06/28/2024 32.6  30.0 - 36.0 g/dL Final   RDW 87/72/7974 14.0  11.5 - 15.5 % Final   Platelets 06/28/2024 278  150 - 400 K/uL Final   nRBC 06/28/2024 0.0  0.0 - 0.2 % Final   Neutrophils Relative % 06/28/2024 62  % Final   Neutro Abs 06/28/2024 6.0  1.7 - 7.7 K/uL Final   Lymphocytes Relative 06/28/2024 24  % Final   Lymphs Abs 06/28/2024 2.3  0.7 - 4.0 K/uL Final   Monocytes Relative 06/28/2024 9  % Final   Monocytes Absolute 06/28/2024 0.9  0.1 - 1.0 K/uL Final   Eosinophils Relative 06/28/2024 4  % Final   Eosinophils Absolute 06/28/2024 0.3  0.0 - 0.5 K/uL Final   Basophils Relative 06/28/2024 1  % Final   Basophils Absolute 06/28/2024 0.1  0.0 - 0.1 K/uL Final   Immature Granulocytes 06/28/2024 0  % Final   Abs Immature Granulocytes 06/28/2024 0.03  0.00 - 0.07 K/uL Final   Performed at [facility], [address] [place], [place] [address]   Sodium 06/28/2024 142  135 - 145 mmol/L Final   Potassium 06/28/2024 4.4  3.5 - 5.1 mmol/L Final   Chloride 06/28/2024 105  98 - 111 mmol/L Final   CO2 06/28/2024 27  22 - 32 mmol/L Final   Glucose, Bld 06/28/2024 80  70 - 99 mg/dL Final   Glucose reference range applies only to samples taken after fasting for at least 8 hours.   BUN 06/28/2024 25 (H)  6 - 20 mg/dL Final   Creatinine, Ser 06/28/2024 1.33 (H)  0.61 - 1.24 mg/dL Final   Calcium 87/72/7974 9.7  8.9 - 10.3 mg/dL Final   Total Protein 87/72/7974 6.9  6.5 - 8.1 g/dL Final   Albumin 87/72/7974 4.5  3.5 - 5.0 g/dL Final   AST 87/72/7974 38  15 - 41 U/L Final   ALT 06/28/2024 29  0 - 44 U/L Final   Alkaline Phosphatase 06/28/2024 87  38 - 126  U/L Final   Total Bilirubin 06/28/2024 0.5  0.0 - 1.2 mg/dL Final   GFR, Estimated 06/28/2024 >60  >60 mL/min Final    Comment: (NOTE) Calculated using the CKD-EPI Creatinine Equation (2021)    Anion gap 06/28/2024 11  5 - 15 Final   Performed at [facility], [address] [place], [place] [address]   Alcohol, Ethyl (B) 06/28/2024 <15  <15 mg/dL Final   Comment: (NOTE) For medical purposes only. Performed at [facility], [address] [place], [place] [address]   Admission on 06/12/2024, Discharged on 06/12/2024  Component Date Value Ref Range Status   WBC 06/12/2024 10.0  4.0 - 10.5 K/uL Final   RBC 06/12/2024 5.63  4.22 - 5.81 MIL/uL Final   Hemoglobin 06/12/2024 14.8  13.0 - 17.0 g/dL Final   HCT 87/88/7974 45.5  39.0 - 52.0 % Final   MCV 06/12/2024 80.8  80.0 - 100.0 fL Final   MCH 06/12/2024 26.3  26.0 - 34.0 pg Final   MCHC 06/12/2024 32.5  30.0 - 36.0 g/dL Final   RDW 87/88/7974 14.1  11.5 - 15.5 % Final   Platelets 06/12/2024 289  150 - 400 K/uL Final   nRBC 06/12/2024 0.0  0.0 - 0.2 % Final   Neutrophils Relative % 06/12/2024 82  % Final   Neutro Abs 06/12/2024 8.1 (H)  1.7 - 7.7 K/uL Final   Lymphocytes Relative 06/12/2024 9  % Final   Lymphs Abs 06/12/2024 0.9  0.7 - 4.0 K/uL Final   Monocytes Relative 06/12/2024 9  % Final   Monocytes Absolute 06/12/2024 0.9  0.1 - 1.0 K/uL Final   Eosinophils Relative 06/12/2024 0  % Final   Eosinophils Absolute 06/12/2024 0.0  0.0 - 0.5 K/uL Final   Basophils Relative 06/12/2024 0  % Final   Basophils Absolute 06/12/2024 0.0  0.0 - 0.1 K/uL Final   Immature Granulocytes 06/12/2024 0  % Final   Abs Immature Granulocytes 06/12/2024 0.04  0.00 - 0.07 K/uL Final   Performed at [facility], [address], [place], [place] [address]   Sodium 06/12/2024 133 (L)  135 - 145 mmol/L Final   Potassium 06/12/2024 4.5  3.5 - 5.1 mmol/L Final   Chloride 06/12/2024 97 (L)  98 - 111 mmol/L Final   CO2 06/12/2024 25  22 - 32 mmol/L Final   Glucose, Bld 06/12/2024 73  70 - 99 mg/dL Final   Glucose reference  range applies only to samples taken after fasting for at least 8 hours.   BUN 06/12/2024 13  6 - 20 mg/dL Final   Creatinine, Ser 06/12/2024 1.26 (H)  0.61 - 1.24 mg/dL Final   Calcium 87/88/7974 9.7  8.9 - 10.3 mg/dL Final   Total Protein 87/88/7974 7.5  6.5 - 8.1 g/dL Final   Albumin 87/88/7974 4.6  3.5 - 5.0 g/dL Final   AST 87/88/7974 49 (H)  15 - 41 U/L Final   ALT 06/12/2024 27  0 - 44 U/L Final   Alkaline Phosphatase 06/12/2024 78  38 - 126 U/L Final   Total Bilirubin 06/12/2024 1.5 (H)  0.0 - 1.2 mg/dL Final   GFR, Estimated 06/12/2024 >60  >60 mL/min Final   Comment: (NOTE) Calculated using the CKD-EPI Creatinine Equation (2021)    Anion gap 06/12/2024 11  5 - 15 Final   Performed at [facility], [address], [place], [place] [address]   POC Amphetamine UR 06/12/2024  Positive (A)  NONE DETECTED (Cut Off Level 1000 ng/mL) Final   POC Secobarbital (BAR) 06/12/2024 None Detected  NONE DETECTED (Cut Off Level 300 ng/mL) Final   POC Buprenorphine (BUP) 06/12/2024 None Detected  NONE DETECTED (Cut Off Level 10 ng/mL) Final   POC Oxazepam (BZO) 06/12/2024 None Detected  NONE DETECTED (Cut Off Level 300 ng/mL) Final   POC Cocaine UR 06/12/2024 None Detected  NONE DETECTED (Cut Off Level 300 ng/mL) Final   POC Methamphetamine UR 06/12/2024 Positive (A)  NONE DETECTED (Cut Off Level 1000 ng/mL) Final   POC Morphine 06/12/2024 None Detected  NONE DETECTED (Cut Off Level 300 ng/mL) Final   POC Methadone UR 06/12/2024 None Detected  NONE DETECTED (Cut Off Level 300 ng/mL) Final   POC Oxycodone UR 06/12/2024 None Detected  NONE DETECTED (Cut Off Level 100 ng/mL) Final   POC Marijuana UR 06/12/2024 Positive (A)  NONE DETECTED (Cut Off Level 50 ng/mL) Final   Alcohol, Ethyl (B) 06/12/2024 <15  <15 mg/dL Final   Comment: (NOTE) For medical purposes only. Performed at [facility], [address] [place], [place] [address]   Admission on 06/07/2024,  Discharged on 06/07/2024  Component Date Value Ref Range Status   Lipase 06/07/2024 28  11 - 51 U/L Final   Performed at [facility], [address], [place], [place] [address]   Sodium 06/07/2024 138  135 - 145 mmol/L Final   Potassium 06/07/2024 4.1  3.5 - 5.1 mmol/L Final   Chloride 06/07/2024 107  98 - 111 mmol/L Final   CO2 06/07/2024 27  22 - 32 mmol/L Final   Glucose, Bld 06/07/2024 66 (L)  70 - 99 mg/dL Final   Glucose reference range applies only to samples taken after fasting for at least 8 hours.   BUN 06/07/2024 15  6 - 20 mg/dL Final   Creatinine, Ser 06/07/2024 1.39 (H)  0.61 - 1.24 mg/dL Final   Calcium 87/93/7974 9.0  8.9 - 10.3 mg/dL Final   Total Protein 87/93/7974 6.2 (L)  6.5 - 8.1 g/dL Final   Albumin 87/93/7974 3.6  3.5 - 5.0 g/dL Final   AST 87/93/7974 26  15 - 41 U/L Final   ALT 06/07/2024 17  0 - 44 U/L Final   Alkaline Phosphatase 06/07/2024 62  38 - 126 U/L Final   Total Bilirubin 06/07/2024 0.2  0.0 - 1.2 mg/dL Final   GFR, Estimated 06/07/2024 >60  >60 mL/min Final   Comment: (NOTE) Calculated using the CKD-EPI Creatinine Equation (2021)    Anion gap 06/07/2024 4 (L)  5 - 15 Final   Performed at [facility], [address], [place], [place] [address]   WBC 06/07/2024 5.9  4.0 - 10.5 K/uL Final   RBC 06/07/2024 5.25  4.22 - 5.81 MIL/uL Final   Hemoglobin 06/07/2024 13.7  13.0 - 17.0 g/dL Final   HCT 87/93/7974 43.0  39.0 - 52.0 % Final   MCV 06/07/2024 81.9  80.0 - 100.0 fL Final   MCH 06/07/2024 26.1  26.0 - 34.0 pg Final   MCHC 06/07/2024 31.9  30.0 - 36.0 g/dL Final   RDW 87/93/7974 13.8  11.5 - 15.5 % Final   Platelets 06/07/2024 220  150 - 400 K/uL Final   nRBC 06/07/2024 0.0  0.0 - 0.2 % Final   Performed at [facility], [address], [place], [place] [address]   Color, Urine 06/07/2024 YELLOW  YELLOW Final  APPearance 06/07/2024 CLOUDY (A)  CLEAR Final   Specific Gravity, Urine 06/07/2024  1.015  1.005 - 1.030 Final   pH 06/07/2024 8.0  5.0 - 8.0 Final   Glucose, UA 06/07/2024 NEGATIVE  NEGATIVE mg/dL Final   Hgb urine dipstick 06/07/2024 NEGATIVE  NEGATIVE Final   Bilirubin Urine 06/07/2024 NEGATIVE  NEGATIVE Final   Ketones, ur 06/07/2024 NEGATIVE  NEGATIVE mg/dL Final   Protein, ur 87/93/7974 NEGATIVE  NEGATIVE mg/dL Final   Nitrite 87/93/7974 NEGATIVE  NEGATIVE Final   Leukocytes,Ua 06/07/2024 NEGATIVE  NEGATIVE Final   Performed at [facility], [address], [place], [place] [address]  Admission on 06/02/2024, Discharged on 06/03/2024  Component Date Value Ref Range Status   Sodium 06/02/2024 141  135 - 145 mmol/L Final   Potassium 06/02/2024 3.6  3.5 - 5.1 mmol/L Final   Chloride 06/02/2024 107  98 - 111 mmol/L Final   CO2 06/02/2024 27  22 - 32 mmol/L Final   Glucose, Bld 06/02/2024 82  70 - 99 mg/dL Final   Glucose reference range applies only to samples taken after fasting for at least 8 hours.   BUN 06/02/2024 14  6 - 20 mg/dL Final   Creatinine, Ser 06/02/2024 1.16  0.61 - 1.24 mg/dL Final   Calcium 87/98/7974 8.7 (L)  8.9 - 10.3 mg/dL Final   Total Protein 87/98/7974 6.3 (L)  6.5 - 8.1 g/dL Final   Albumin 87/98/7974 3.7  3.5 - 5.0 g/dL Final   AST 87/98/7974 26  15 - 41 U/L Final   ALT 06/02/2024 18  0 - 44 U/L Final   Alkaline Phosphatase 06/02/2024 59  38 - 126 U/L Final   Total Bilirubin 06/02/2024 0.7  0.0 - 1.2 mg/dL Final   GFR, Estimated 06/02/2024 >60  >60 mL/min Final   Comment: (NOTE) Calculated using the CKD-EPI Creatinine Equation (2021)    Anion gap 06/02/2024 7  5 - 15 Final   Performed at [facility], [address] [place], [place] [address]   Alcohol, Ethyl (B) 06/02/2024 <15  <15 mg/dL Final   Comment: (NOTE) For medical purposes only. Performed at [facility], [address], [place], [place] [address]    WBC 06/02/2024 8.1  4.0 - 10.5 K/uL Final   RBC 06/02/2024 5.22  4.22 - 5.81  MIL/uL Final   Hemoglobin 06/02/2024 13.5  13.0 - 17.0 g/dL Final   HCT 87/98/7974 43.3  39.0 - 52.0 % Final   MCV 06/02/2024 83.0  80.0 - 100.0 fL Final   MCH 06/02/2024 25.9 (L)  26.0 - 34.0 pg Final   MCHC 06/02/2024 31.2  30.0 - 36.0 g/dL Final   RDW 87/98/7974 13.8  11.5 - 15.5 % Final   Platelets 06/02/2024 220  150 - 400 K/uL Final   nRBC 06/02/2024 0.0  0.0 - 0.2 % Final   Performed at [facility], [address] [place], [place] [address]   Opiates 06/02/2024 NONE DETECTED  NONE DETECTED Final   Cocaine 06/02/2024 NONE DETECTED  NONE DETECTED Final   Benzodiazepines 06/02/2024 NONE DETECTED  NONE DETECTED Final   Amphetamines 06/02/2024 NONE DETECTED  NONE DETECTED Final   Tetrahydrocannabinol 06/02/2024 NONE DETECTED  NONE DETECTED Final   Barbiturates 06/02/2024 NONE DETECTED  NONE DETECTED Final   Comment: (NOTE) DRUG SCREEN FOR MEDICAL PURPOSES ONLY.  IF CONFIRMATION IS NEEDED FOR ANY PURPOSE, NOTIFY LAB WITHIN 5 DAYS.  LOWEST DETECTABLE LIMITS FOR URINE DRUG  SCREEN Drug Class                     Cutoff (ng/mL) Amphetamine and metabolites    1000 Barbiturate and metabolites    200 Benzodiazepine                 200 Opiates and metabolites        300 Cocaine and metabolites        300 THC                            50 Performed at [facility], [address], [place], [place] [address]   Admission on 05/06/2024, Discharged on 05/06/2024  Component Date Value Ref Range Status   Sodium 05/06/2024 138  135 - 145 mmol/L Final   Potassium 05/06/2024 3.7  3.5 - 5.1 mmol/L Final   Chloride 05/06/2024 104  98 - 111 mmol/L Final   CO2 05/06/2024 25  22 - 32 mmol/L Final   Glucose, Bld 05/06/2024 89  70 - 99 mg/dL Final   Glucose reference range applies only to samples taken after fasting for at least 8 hours.   BUN 05/06/2024 12  6 - 20 mg/dL Final   Creatinine, Ser 05/06/2024 1.25 (H)  0.61 - 1.24 mg/dL Final   Calcium 88/95/7974  8.9  8.9 - 10.3 mg/dL Final   Total Protein 88/95/7974 6.3 (L)  6.5 - 8.1 g/dL Final   Albumin 88/95/7974 3.6  3.5 - 5.0 g/dL Final   AST 88/95/7974 28  15 - 41 U/L Final   ALT 05/06/2024 20  0 - 44 U/L Final   Alkaline Phosphatase 05/06/2024 54  38 - 126 U/L Final   Total Bilirubin 05/06/2024 0.6  0.0 - 1.2 mg/dL Final   GFR, Estimated 05/06/2024 >60  >60 mL/min Final   Comment: (NOTE) Calculated using the CKD-EPI Creatinine Equation (2021)    Anion gap 05/06/2024 9  5 - 15 Final   Performed at [facility], [address], [place], [place] [address]   Alcohol, Ethyl (B) 05/06/2024 <15  <15 mg/dL Final   Comment: (NOTE) For medical purposes only. Performed at [facility], [address] [place], [place] [address]    WBC 05/06/2024 9.6  4.0 - 10.5 K/uL Final   RBC 05/06/2024 5.03  4.22 - 5.81 MIL/uL Final   Hemoglobin 05/06/2024 12.9 (L)  13.0 - 17.0 g/dL Final   HCT 88/95/7974 41.0  39.0 - 52.0 % Final   MCV 05/06/2024 81.5  80.0 - 100.0 fL Final   MCH 05/06/2024 25.6 (L)  26.0 - 34.0 pg Final   MCHC 05/06/2024 31.5  30.0 - 36.0 g/dL Final   RDW 88/95/7974 13.5  11.5 - 15.5 % Final   Platelets 05/06/2024 265  150 - 400 K/uL Final   nRBC 05/06/2024 0.0  0.0 - 0.2 % Final   Performed at [facility], [address] [place], [place] [address]   Opiates 05/06/2024 NONE DETECTED  NONE DETECTED Final   Cocaine 05/06/2024 POSITIVE (A)  NONE DETECTED Final   Benzodiazepines 05/06/2024 NONE DETECTED  NONE DETECTED Final   Amphetamines 05/06/2024 NONE DETECTED  NONE DETECTED Final   Tetrahydrocannabinol 05/06/2024 NONE DETECTED  NONE DETECTED Final   Barbiturates 05/06/2024 NONE DETECTED  NONE DETECTED Final   Comment: (NOTE) DRUG SCREEN FOR MEDICAL PURPOSES ONLY.  IF CONFIRMATION IS NEEDED FOR ANY  PURPOSE, NOTIFY LAB WITHIN 5 DAYS.  LOWEST DETECTABLE LIMITS FOR URINE DRUG SCREEN Drug Class                     Cutoff  (ng/mL) Amphetamine and metabolites    1000 Barbiturate and metabolites    200 Benzodiazepine                 200 Opiates and metabolites        300 Cocaine and metabolites        300 THC                            50 Performed at [facility], [address], [place], [place] [address]   Admission on 05/03/2024, Discharged on 05/03/2024  Component Date Value Ref Range Status   SARS Coronavirus 2 by RT PCR 05/03/2024 NEGATIVE  NEGATIVE Final   Influenza A by PCR 05/03/2024 NEGATIVE  NEGATIVE Final   Influenza B by PCR 05/03/2024 NEGATIVE  NEGATIVE Final   Comment: (NOTE) The Xpert Xpress SARS-CoV-2/FLU/RSV plus assay is intended as an aid in the diagnosis of influenza from Nasopharyngeal swab specimens and should not be used as a sole basis for treatment. Nasal washings and aspirates are unacceptable for Xpert Xpress SARS-CoV-2/FLU/RSV testing.  Fact Sheet for Patients: bloggercourse.com  Fact Sheet for Healthcare Providers: seriousbroker.it  This test is not yet approved or cleared by the [place]  FDA and has been authorized for detection and/or diagnosis of SARS-CoV-2 by FDA under an Emergency Use Authorization (EUA). This EUA will remain in effect (meaning this test can be used) for the duration of the COVID-19 declaration under Section 564(b)(1) of the Act, 21 U.S.C. section 360bbb-3(b)(1), unless the authorization is terminated or revoked.     Resp Syncytial Virus by PCR 05/03/2024 NEGATIVE  NEGATIVE Final   Comment: (NOTE) Fact Sheet for Patients: bloggercourse.com  Fact Sheet for Healthcare Providers: seriousbroker.it  This test is not yet approved or cleared by the [place]  FDA and has been authorized for detection and/or diagnosis of SARS-CoV-2 by FDA under an Emergency Use Authorization (EUA). This EUA will remain in effect (meaning this test  can be used) for the duration of the COVID-19 declaration under Section 564(b)(1) of the Act, 21 U.S.C. section 360bbb-3(b)(1), unless the authorization is terminated or revoked.  Performed at [facility], [address] [place], [place] [address]   Admission on 05/01/2024, Discharged on 05/02/2024  Component Date Value Ref Range Status   Sodium 05/01/2024 137  135 - 145 mmol/L Final   Potassium 05/01/2024 4.0  3.5 - 5.1 mmol/L Final   Chloride 05/01/2024 103  98 - 111 mmol/L Final   CO2 05/01/2024 23  22 - 32 mmol/L Final   Glucose, Bld 05/01/2024 98  70 - 99 mg/dL Final   Glucose reference range applies only to samples taken after fasting for at least 8 hours.   BUN 05/01/2024 27 (H)  6 - 20 mg/dL Final   Creatinine, Ser 05/01/2024 1.36 (H)  0.61 - 1.24 mg/dL Final   Calcium 89/69/7974 8.8 (L)  8.9 - 10.3 mg/dL Final   Total Protein 89/69/7974 5.8 (L)  6.5 - 8.1 g/dL Final   Albumin 89/69/7974 3.5  3.5 - 5.0 g/dL Final   AST 89/69/7974 46 (H)  15 - 41 U/L Final   ALT 05/01/2024 25  0 - 44 U/L Final   Alkaline  Phosphatase 05/01/2024 59  38 - 126 U/L Final   Total Bilirubin 05/01/2024 0.7  0.0 - 1.2 mg/dL Final   GFR, Estimated 05/01/2024 >60  >60 mL/min Final   Comment: (NOTE) Calculated using the CKD-EPI Creatinine Equation (2021)    Anion gap 05/01/2024 11  5 - 15 Final   Performed at [facility], [address], [place], [place] [address]   Alcohol, Ethyl (B) 05/01/2024 <15  <15 mg/dL Final   Comment: (NOTE) For medical purposes only. Performed at [facility], [address], [place], [place] [address]    WBC 05/01/2024 10.9 (H)  4.0 - 10.5 K/uL Final   RBC 05/01/2024 5.01  4.22 - 5.81 MIL/uL Final   Hemoglobin 05/01/2024 13.0  13.0 - 17.0 g/dL Final   HCT 89/69/7974 40.9  39.0 - 52.0 % Final   MCV 05/01/2024 81.6  80.0 - 100.0 fL Final   MCH 05/01/2024 25.9 (L)  26.0 - 34.0 pg Final   MCHC 05/01/2024 31.8  30.0 - 36.0 g/dL  Final   RDW 89/69/7974 13.9  11.5 - 15.5 % Final   Platelets 05/01/2024 248  150 - 400 K/uL Final   nRBC 05/01/2024 0.0  0.0 - 0.2 % Final   Neutrophils Relative % 05/01/2024 59  % Final   Neutro Abs 05/01/2024 6.5  1.7 - 7.7 K/uL Final   Lymphocytes Relative 05/01/2024 25  % Final   Lymphs Abs 05/01/2024 2.7  0.7 - 4.0 K/uL Final   Monocytes Relative 05/01/2024 11  % Final   Monocytes Absolute 05/01/2024 1.2 (H)  0.1 - 1.0 K/uL Final   Eosinophils Relative 05/01/2024 3  % Final   Eosinophils Absolute 05/01/2024 0.4  0.0 - 0.5 K/uL Final   Basophils Relative 05/01/2024 1  % Final   Basophils Absolute 05/01/2024 0.1  0.0 - 0.1 K/uL Final   Immature Granulocytes 05/01/2024 1  % Final   Abs Immature Granulocytes 05/01/2024 0.05  0.00 - 0.07 K/uL Final   Performed at [facility], [address], [place], [place] [address]  Admission on 04/22/2024, Discharged on 04/24/2024  Component Date Value Ref Range Status   Opiates 04/23/2024 NEGATIVE  NEGATIVE Final   Cocaine 04/23/2024 POSITIVE (A)  NEGATIVE Final   Benzodiazepines 04/23/2024 NEGATIVE  NEGATIVE Final   Amphetamines 04/23/2024 NEGATIVE  NEGATIVE Final   Tetrahydrocannabinol 04/23/2024 NEGATIVE  NEGATIVE Final   Barbiturates 04/23/2024 NEGATIVE  NEGATIVE Final   Methadone Scn, Ur 04/23/2024 NEGATIVE  NEGATIVE Final   Fentanyl  04/23/2024 NEGATIVE  NEGATIVE Final   Comment: (NOTE) Drug screen is for Medical Purposes only. Positive results are preliminary only. If confirmation is needed, notify lab within 5 days.  Drug Class                 Cutoff (ng/mL) Amphetamine and metabolites 1000 Barbiturate and metabolites 200 Benzodiazepine              200 Opiates and metabolites     300 Cocaine and metabolites     300 THC                         50 Fentanyl                     5 Methadone                   300  Trazodone  is metabolized in vivo to several  metabolites,  including pharmacologically active  m-CPP, which is excreted in the  urine.  Immunoassay screens for amphetamines and MDMA have potential  cross-reactivity with these compounds and may provide false positive  result.  Performed at [facility], [address] [place], [place] [address]    WBC 04/22/2024 13.1 (H)  4.0 - 10.5 K/uL Final   RBC 04/22/2024 5.51  4.22 - 5.81 MIL/uL Final   Hemoglobin 04/22/2024 14.2  13.0 - 17.0 g/dL Final   HCT 89/78/7974 44.8  39.0 - 52.0 % Final   MCV 04/22/2024 81.3  80.0 - 100.0 fL Final   MCH 04/22/2024 25.8 (L)  26.0 - 34.0 pg Final   MCHC 04/22/2024 31.7  30.0 - 36.0 g/dL Final   RDW 89/78/7974 13.6  11.5 - 15.5 % Final   Platelets 04/22/2024 284  150 - 400 K/uL Final   nRBC 04/22/2024 0.0  0.0 - 0.2 % Final   Neutrophils Relative % 04/22/2024 71  % Final   Neutro Abs 04/22/2024 9.3 (H)  1.7 - 7.7 K/uL Final   Lymphocytes Relative 04/22/2024 18  % Final   Lymphs Abs 04/22/2024 2.3  0.7 - 4.0 K/uL Final   Monocytes Relative 04/22/2024 10  % Final   Monocytes Absolute 04/22/2024 1.3 (H)  0.1 - 1.0 K/uL Final   Eosinophils Relative 04/22/2024 0  % Final   Eosinophils Absolute 04/22/2024 0.0  0.0 - 0.5 K/uL Final   Basophils Relative 04/22/2024 1  % Final   Basophils Absolute 04/22/2024 0.1  0.0 - 0.1 K/uL Final   Immature Granulocytes 04/22/2024 0  % Final   Abs Immature Granulocytes 04/22/2024 0.05  0.00 - 0.07 K/uL Final   Performed at [facility], [address], [place], [place] [address]   Sodium 04/22/2024 138  135 - 145 mmol/L Final   Potassium 04/22/2024 4.4  3.5 - 5.1 mmol/L Final   Chloride 04/22/2024 99  98 - 111 mmol/L Final   CO2 04/22/2024 23  22 - 32 mmol/L Final   Glucose, Bld 04/22/2024 73  70 - 99 mg/dL Final   Glucose reference range applies only to samples taken after fasting for at least 8 hours.   BUN 04/22/2024 20  6 - 20 mg/dL Final   Creatinine, Ser 04/22/2024 1.46 (H)  0.61 - 1.24 mg/dL  Final   Calcium 89/78/7974 10.2  8.9 - 10.3 mg/dL Final   GFR, Estimated 04/22/2024 >60  >60 mL/min Final   Comment: (NOTE) Calculated using the CKD-EPI Creatinine Equation (2021)    Anion gap 04/22/2024 15  5 - 15 Final   Performed at [facility], [address] [place], [place] [address]   Alcohol, Ethyl (B) 04/22/2024 <15  <15 mg/dL Final   Comment: (NOTE) For medical purposes only. Performed at [facility], [address], [place], [place] [address]   Admission on 04/19/2024, Discharged on 04/21/2024  Component Date Value Ref Range Status   Sodium 04/20/2024 136  135 - 145 mmol/L Final   Potassium 04/20/2024 3.9  3.5 - 5.1 mmol/L Final   Chloride 04/20/2024 101  98 - 111 mmol/L Final   CO2 04/20/2024 26  22 - 32 mmol/L Final   Glucose, Bld 04/20/2024 86  70 - 99 mg/dL Final   Glucose reference range applies only to samples taken after fasting for at least 8 hours.   BUN 04/20/2024 12  6 - 20 mg/dL Final   Creatinine, Ser 04/20/2024  1.26 (H)  0.61 - 1.24 mg/dL Final   Calcium 89/80/7974 8.9  8.9 - 10.3 mg/dL Final   Total Protein 89/80/7974 5.8 (L)  6.5 - 8.1 g/dL Final   Albumin 89/80/7974 3.4 (L)  3.5 - 5.0 g/dL Final   AST 89/80/7974 25  15 - 41 U/L Final   ALT 04/20/2024 19  0 - 44 U/L Final   Alkaline Phosphatase 04/20/2024 50  38 - 126 U/L Final   Total Bilirubin 04/20/2024 0.7  0.0 - 1.2 mg/dL Final   GFR, Estimated 04/20/2024 >60  >60 mL/min Final   Comment: (NOTE) Calculated using the CKD-EPI Creatinine Equation (2021)    Anion gap 04/20/2024 9  5 - 15 Final   Performed at [facility], [address], [place], [place] [address]   Alcohol, Ethyl (B) 04/20/2024 <15  <15 mg/dL Final   Comment: (NOTE) For medical purposes only. Performed at [facility], [address], [place], [place] [address]    Opiates 04/20/2024 NONE DETECTED  NONE DETECTED Final   Cocaine 04/20/2024 POSITIVE (A)   NONE DETECTED Final   Benzodiazepines 04/20/2024 NONE DETECTED  NONE DETECTED Final   Amphetamines 04/20/2024 NONE DETECTED  NONE DETECTED Final   Tetrahydrocannabinol 04/20/2024 NONE DETECTED  NONE DETECTED Final   Barbiturates 04/20/2024 NONE DETECTED  NONE DETECTED Final   Comment: (NOTE) DRUG SCREEN FOR MEDICAL PURPOSES ONLY.  IF CONFIRMATION IS NEEDED FOR ANY PURPOSE, NOTIFY LAB WITHIN 5 DAYS.  LOWEST DETECTABLE LIMITS FOR URINE DRUG SCREEN Drug Class                     Cutoff (ng/mL) Amphetamine and metabolites    1000 Barbiturate and metabolites    200 Benzodiazepine                 200 Opiates and metabolites        300 Cocaine and metabolites        300 THC                            50 Performed at [facility], [address] [place], [place] [address]    WBC 04/20/2024 7.9  4.0 - 10.5 K/uL Final   RBC 04/20/2024 5.75  4.22 - 5.81 MIL/uL Final   Hemoglobin 04/20/2024 14.9  13.0 - 17.0 g/dL Final   HCT 89/80/7974 47.2  39.0 - 52.0 % Final   MCV 04/20/2024 82.1  80.0 - 100.0 fL Final   MCH 04/20/2024 25.9 (L)  26.0 - 34.0 pg Final   MCHC 04/20/2024 31.6  30.0 - 36.0 g/dL Final   RDW 89/80/7974 13.6  11.5 - 15.5 % Final   Platelets 04/20/2024 266  150 - 400 K/uL Final   nRBC 04/20/2024 0.0  0.0 - 0.2 % Final   Neutrophils Relative % 04/20/2024 61  % Final   Neutro Abs 04/20/2024 4.8  1.7 - 7.7 K/uL Final   Lymphocytes Relative 04/20/2024 24  % Final   Lymphs Abs 04/20/2024 1.9  0.7 - 4.0 K/uL Final   Monocytes Relative 04/20/2024 9  % Final   Monocytes Absolute 04/20/2024 0.7  0.1 - 1.0 K/uL Final   Eosinophils Relative 04/20/2024 5  % Final   Eosinophils Absolute 04/20/2024 0.4  0.0 - 0.5 K/uL Final   Basophils Relative 04/20/2024 1  % Final   Basophils Absolute 04/20/2024 0.1  0.0 - 0.1 K/uL  Final   Immature Granulocytes 04/20/2024 0  % Final   Abs Immature Granulocytes 04/20/2024 0.03  0.00 - 0.07 K/uL Final   Performed at  [facility], [address], [place], [place] [address]  Admission on 04/15/2024, Discharged on 04/16/2024  Component Date Value Ref Range Status   Alcohol, Ethyl (B) 04/15/2024 <15  <15 mg/dL Final   Comment: (NOTE) For medical purposes only. Performed at [facility], [address] [place], [place] [address]    TSH 04/15/2024 0.945  0.350 - 4.500 uIU/mL Final   Comment: Performed by a 3rd Generation assay with a functional sensitivity of <=0.01 uIU/mL. Performed at [facility], [address], [place], [place] [address]    Sodium 04/15/2024 139  135 - 145 mmol/L Final   Potassium 04/15/2024 4.3  3.5 - 5.1 mmol/L Final   Chloride 04/15/2024 105  98 - 111 mmol/L Final   CO2 04/15/2024 26  22 - 32 mmol/L Final   Glucose, Bld 04/15/2024 89  70 - 99 mg/dL Final   Glucose reference range applies only to samples taken after fasting for at least 8 hours.   BUN 04/15/2024 20  6 - 20 mg/dL Final   Creatinine, Ser 04/15/2024 1.23  0.61 - 1.24 mg/dL Final   Calcium 89/85/7974 8.7 (L)  8.9 - 10.3 mg/dL Final   Total Protein 89/85/7974 5.1 (L)  6.5 - 8.1 g/dL Final   Albumin 89/85/7974 3.1 (L)  3.5 - 5.0 g/dL Final   AST 89/85/7974 29  15 - 41 U/L Final   ALT 04/15/2024 21  0 - 44 U/L Final   Alkaline Phosphatase 04/15/2024 55  38 - 126 U/L Final   Total Bilirubin 04/15/2024 0.3  0.0 - 1.2 mg/dL Final   GFR, Estimated 04/15/2024 >60  >60 mL/min Final   Comment: (NOTE) Calculated using the CKD-EPI Creatinine Equation (2021)    Anion gap 04/15/2024 8  5 - 15 Final   Performed at [facility], [address] [place], [place] [address]   WBC 04/15/2024 7.6  4.0 - 10.5 K/uL Final   RBC 04/15/2024 4.97  4.22 - 5.81 MIL/uL Final   Hemoglobin 04/15/2024 12.9 (L)  13.0 - 17.0 g/dL Final   HCT 89/85/7974 40.7  39.0 - 52.0 % Final   MCV 04/15/2024 81.9  80.0 - 100.0 fL Final   MCH 04/15/2024 26.0  26.0 - 34.0 pg Final   MCHC 04/15/2024 31.7  30.0  - 36.0 g/dL Final   RDW 89/85/7974 13.7  11.5 - 15.5 % Final   Platelets 04/15/2024 239  150 - 400 K/uL Final   nRBC 04/15/2024 0.0  0.0 - 0.2 % Final   Neutrophils Relative % 04/15/2024 51  % Final   Neutro Abs 04/15/2024 3.8  1.7 - 7.7 K/uL Final   Lymphocytes Relative 04/15/2024 32  % Final   Lymphs Abs 04/15/2024 2.4  0.7 - 4.0 K/uL Final   Monocytes Relative 04/15/2024 9  % Final   Monocytes Absolute 04/15/2024 0.7  0.1 - 1.0 K/uL Final   Eosinophils Relative 04/15/2024 7  % Final   Eosinophils Absolute 04/15/2024 0.6 (H)  0.0 - 0.5 K/uL Final   Basophils Relative 04/15/2024 1  % Final   Basophils Absolute 04/15/2024 0.1  0.0 - 0.1 K/uL Final   Immature Granulocytes 04/15/2024 0  % Final   Abs Immature Granulocytes 04/15/2024 0.02  0.00 - 0.07 K/uL Final   Performed at [facility], [address], [place], [place] [address]   POC Amphetamine UR 04/15/2024 None Detected  NONE DETECTED (Cut Off Level 1000 ng/mL) Final   POC Secobarbital (BAR) 04/15/2024 None Detected  NONE DETECTED (Cut Off Level 300 ng/mL) Final   POC Buprenorphine (BUP) 04/15/2024 None Detected  NONE DETECTED (Cut Off Level 10 ng/mL) Final   POC Oxazepam (BZO) 04/15/2024 None Detected (A)  NONE DETECTED (Cut Off Level 300 ng/mL) Final   POC Cocaine UR 04/15/2024 Positive (A)  NONE DETECTED (Cut Off Level 300 ng/mL) Final   POC Methamphetamine UR 04/15/2024 None Detected  NONE DETECTED (Cut Off Level 1000 ng/mL) Final   POC Morphine 04/15/2024 None Detected  NONE DETECTED (Cut Off Level 300 ng/mL) Final   POC Methadone UR 04/15/2024 None Detected  NONE DETECTED (Cut Off Level 300 ng/mL) Final   POC Oxycodone UR 04/15/2024 None Detected  NONE DETECTED (Cut Off Level 100 ng/mL) Final   POC Marijuana UR 04/15/2024 None Detected  NONE DETECTED (Cut Off Level 50 ng/mL) Final  There may be more visits with results that are not included.    Allergies: Patient has no known  allergies.  Medications:  Facility Ordered Medications  Medication   acetaminophen  (TYLENOL ) tablet 650 mg   alum & mag hydroxide-simeth (MAALOX/MYLANTA) 200-200-20 MG/5ML suspension 30 mL   magnesium  hydroxide (MILK OF MAGNESIA) suspension 30 mL   haloperidol  (HALDOL ) tablet 5 mg   And   diphenhydrAMINE  (BENADRYL ) capsule 50 mg   haloperidol  lactate (HALDOL ) injection 5 mg   And   diphenhydrAMINE  (BENADRYL ) injection 50 mg   And   LORazepam  (ATIVAN ) injection 2 mg   haloperidol  lactate (HALDOL ) injection 10 mg   And   diphenhydrAMINE  (BENADRYL ) injection 50 mg   And   LORazepam  (ATIVAN ) injection 2 mg   traZODone  (DESYREL ) tablet 50 mg   hydrOXYzine  (ATARAX ) tablet 25 mg   risperiDONE  (RISPERDAL  M-TABS) disintegrating tablet 2 mg   [START ON 06/29/2024] risperiDONE  (RISPERDAL  M-TABS) disintegrating tablet 1 mg   PTA Medications  Medication Sig   risperiDONE  (RISPERDAL ) 1 MG tablet Take 1 tablet (1 mg total) by mouth at bedtime.      Medical Decision Making  ***    Recommendations  [facility] MSE Recommendations:[id number]}  Roxianne FORBES Olp, NP 06/28/2024  11:02 PM

## 2024-06-29 NOTE — Progress Notes (Signed)
 Patient is currently resting at this time.  NO acute distress noted.  Respirations present, even and unlabored.  No issues noted at this time.  Will continue Q 15 min safety checks for safety/behavior per facility protocol.

## 2024-06-29 NOTE — ED Triage Notes (Signed)
 Pt c.o bilateral feet pain and hypothermia

## 2024-06-29 NOTE — ED Provider Triage Note (Signed)
 Emergency Medicine Provider Triage Evaluation Note  Aaron Rubio , a 43 y.o. male  was evaluated in triage.  Pt complains of bilateral feet pain.  Complains of loss of toenails and feet discomfort, no specific injury or inciting event but does note exposure to the cold and loss of multiple toenails on bilateral feet that is impairing his ability to walk.  Review of Systems  Positive: As above Negative: As above  Physical Exam  BP (!) 151/95   Pulse 90   Temp (!) 97.4 F (36.3 C)   Resp 14   SpO2 95%  Gen:   Awake, no distress   Resp:  Normal effort  MSK:   Moves extremities without difficulty  Other:    Medical Decision Making  Medically screening exam initiated at 2:48 PM.  Appropriate orders placed.  Aaron Rubio was informed that the remainder of the evaluation will be completed by another provider, this initial triage assessment does not replace that evaluation, and the importance of remaining in the ED until their evaluation is complete.  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  Frequent ED visits.    Aaron Rubio SAILOR, [place] 06/29/24 1451
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
## 2024-06-29 NOTE — ED Notes (Signed)
 Pt was provided with breakfast at bedside.

## 2024-06-30 ENCOUNTER — Other Ambulatory Visit: Payer: Self-pay

## 2024-06-30 ENCOUNTER — Encounter (HOSPITAL_COMMUNITY): Payer: Self-pay

## 2024-06-30 ENCOUNTER — Emergency Department (HOSPITAL_COMMUNITY)
Admission: EM | Admit: 2024-06-30 | Discharge: 2024-06-30 | Disposition: A | Discharge status: Home / Self Care | Attending: Emergency Medicine | Admitting: Emergency Medicine

## 2024-06-30 DIAGNOSIS — I10 Essential (primary) hypertension: Secondary | ICD-10-CM | POA: Insufficient documentation

## 2024-06-30 DIAGNOSIS — Z659 Problem related to unspecified psychosocial circumstances: Secondary | ICD-10-CM | POA: Insufficient documentation

## 2024-06-30 NOTE — ED Triage Notes (Signed)
 Patient states that he was supposed to stay here to get medication.

## 2024-06-30 NOTE — ED Notes (Signed)
 Pt left without DC paperwork or DC vitals

## 2024-06-30 NOTE — ED Provider Notes (Signed)
 " [place] [facility] AT [facility] Provider Note   CSN: [id number] Arrival date & time: 06/30/24  1736     Patient presents with: Medication Refill   Aaron Rubio is a 43 y.o. male with history of schizophrenia, hypertension.  Presents to ED complaining of being lost to the system.  Reports that he is here requesting social work assistance.  He reports that he feels as if he has been lost to the system.  The patient is able to give me a exact specific complaint but just states that he is unhappy with the lack of resources he has been given as an outpatient.  He denies any SI, HI, AVH.  Denies any drug or alcohol use.  He is not responding to internal stimuli.  He was seen earlier today with complaints of pain in both feet and diagnosed with malingering.  He apparently never left the lobby.  Patient denies any acute concerns this evening.  He is not responding to internal stimuli.  He denies SI, HI, AVH.   Medication Refill      Prior to Admission medications  Medication Sig Start Date End Date Taking? Authorizing Provider  risperiDONE  (RISPERDAL  M-TABS) 1 MG disintegrating tablet Take 1 tablet (1 mg total) by mouth daily. 06/29/24   Delsie Lynwood Morene Lavone, MD  risperiDONE  (RISPERDAL  M-TABS) 2 MG disintegrating tablet Take 1 tablet (2 mg total) by mouth at bedtime. 06/29/24   Delsie Lynwood Morene Lavone, MD    Allergies: Patient has no known allergies.    Review of Systems  All other systems reviewed and are negative.   Updated Vital Signs BP (!) 165/116 (BP Location: Right Arm)   Pulse 86   Temp 98 F (36.7 C)   Resp 18   Ht 6' 2 (1.88 m)   Wt 99.9 kg   SpO2 100%   BMI 28.28 kg/m   Physical Exam Vitals and nursing note reviewed.  Constitutional:      General: He is not in acute distress.    Appearance: He is well-developed.  HENT:     Head: Normocephalic and atraumatic.  Eyes:     Conjunctiva/sclera: Conjunctivae normal.   Cardiovascular:     Rate and Rhythm: Normal rate and regular rhythm.     Heart sounds: No murmur heard. Pulmonary:     Effort: Pulmonary effort is normal. No respiratory distress.     Breath sounds: Normal breath sounds.  Abdominal:     Palpations: Abdomen is soft.     Tenderness: There is no abdominal tenderness.  Musculoskeletal:        General: No swelling.     Cervical back: Neck supple.  Skin:    General: Skin is warm and dry.     Capillary Refill: Capillary refill takes less than 2 seconds.  Neurological:     Mental Status: He is alert.  Psychiatric:        Mood and Affect: Mood normal.     (all labs ordered are listed, but only abnormal results are displayed) Labs Reviewed - No data to display  EKG: None  Radiology: DG Foot Complete Left Result Date: 06/29/2024 EXAM: 3 OR MORE VIEW(S) XRAY OF THE LEFT FOOT 06/29/2024 03:22:00 PM COMPARISON: None available. CLINICAL HISTORY: pain, cold exposure, loss of toe nails FINDINGS: BONES AND JOINTS: No cortical erosion or destruction. No acute fracture. Mild hallux valgus deformity. SOFT TISSUES: The soft tissues are unremarkable. IMPRESSION: 1. No acute osseous abnormality. Electronically signed  by: Morgane Naveau MD 06/29/2024 05:00 PM EST RP Workstation: [id number]   DG Foot Complete Right Result Date: 06/29/2024 EXAM: 3 OR MORE VIEW(S) XRAY OF THE FOOT 06/29/2024 03:22:00 PM COMPARISON: None available. CLINICAL HISTORY: pain, cold exposure, loss of toe nails FINDINGS: BONES AND JOINTS: No cortical erosion or destruction. No acute fracture. Mild hallux valgus deformity. SOFT TISSUES: The soft tissues are unremarkable. IMPRESSION: 1. No acute findings. Electronically signed by: Morgane Naveau MD 06/29/2024 04:59 PM EST RP Workstation: [id number]    Procedures   Medications Ordered in the ED - No data to display   Medical Decision Making  Patient here with complaint of lack of social assistance.  Patient was given  engineer, building services, resource guide for shelters, advised to follow-up with behavioral health if he feels the need to do so.  He denies any SI, HI, AVH here tonight.  Denies any drug or alcohol use.  Not responding to internal stimuli.  Patient given resources.  Given food, bus pass.  Discharged at this time in stable condition.    Final diagnoses:  Concerned about having social problem    ED Discharge Orders     None          Ruthell Lonni F[place], [place] 06/30/24 2313  "

## 2024-06-30 NOTE — Discharge Instructions (Addendum)
 Please follow up outpatient with PCP. Follow up at [email or web address]. Read attached guides concerning financial resources and shelter resources.

## 2024-06-30 NOTE — ED Provider Notes (Signed)
 " [place] [facility] AT [facility] Provider Note   CSN: [id number] Arrival date & time: 06/29/24  1414     Patient presents with: Foot Pain   Aaron Rubio is a 43 y.o. male who presents to the emergency department with a chief complaint of bilateral foot pain and hypothermia per triage note.  Patient would not give me any history of acute illness.  I walked into the room and patient states that his feet no longer hurt and that he was told to sit in here until he was seen.  I explained to the patient that I am a certified physician assistant and I would be the one providing care for him today, at this time the patient refused any type of further history or medical examination.  I explained to the patient that his x-rays of his bilateral feet were negative for acute process.  At this time the patient got up and walked out of the emergency department stating all you are going to do is kicking out of here like everybody else. Patient was continually offered further care and a medical screening exam but refused.     Foot Pain       Prior to Admission medications  Medication Sig Start Date End Date Taking? Authorizing Provider  risperiDONE  (RISPERDAL  M-TABS) 1 MG disintegrating tablet Take 1 tablet (1 mg total) by mouth daily. 06/29/24   Delsie Lynwood Morene Lavone, MD  risperiDONE  (RISPERDAL  M-TABS) 2 MG disintegrating tablet Take 1 tablet (2 mg total) by mouth at bedtime. 06/29/24   Delsie Lynwood Morene Lavone, MD    Allergies: Patient has no known allergies.    Review of Systems  Musculoskeletal:  Positive for arthralgias (bilateral foot pain).    Updated Vital Signs BP (!) 157/113 (BP Location: Right Arm)   Pulse 81   Temp (!) 97.5 F (36.4 C)   Resp 17   SpO2 91%   Physical Exam Vitals and nursing note reviewed.  Constitutional:      General: He is awake. He is not in acute distress.    Appearance: Normal appearance. He is not  ill-appearing, toxic-appearing or diaphoretic.  HENT:     Head: Normocephalic and atraumatic.  Eyes:     General: No scleral icterus. Pulmonary:     Effort: Pulmonary effort is normal. No respiratory distress.  Musculoskeletal:        General: Normal range of motion.     Comments: Grossly normal ROM of all 4 extremities, patient ambulatory without assistance  Skin:    Capillary Refill: Capillary refill takes less than 2 seconds.     Comments: Patient refused foot exam, would not take off his socks or shoes  Neurological:     General: No focal deficit present.     Mental Status: He is alert.     (all labs ordered are listed, but only abnormal results are displayed) Labs Reviewed - No data to display  EKG: None  Radiology: DG Foot Complete Left Result Date: 06/29/2024 EXAM: 3 OR MORE VIEW(S) XRAY OF THE LEFT FOOT 06/29/2024 03:22:00 PM COMPARISON: None available. CLINICAL HISTORY: pain, cold exposure, loss of toe nails FINDINGS: BONES AND JOINTS: No cortical erosion or destruction. No acute fracture. Mild hallux valgus deformity. SOFT TISSUES: The soft tissues are unremarkable. IMPRESSION: 1. No acute osseous abnormality. Electronically signed by: Morgane Naveau MD 06/29/2024 05:00 PM EST RP Workstation: [id number]   DG Foot Complete Right Result Date: 06/29/2024 EXAM: 3 OR MORE  VIEW(S) XRAY OF THE FOOT 06/29/2024 03:22:00 PM COMPARISON: None available. CLINICAL HISTORY: pain, cold exposure, loss of toe nails FINDINGS: BONES AND JOINTS: No cortical erosion or destruction. No acute fracture. Mild hallux valgus deformity. SOFT TISSUES: The soft tissues are unremarkable. IMPRESSION: 1. No acute findings. Electronically signed by: Morgane Naveau MD 06/29/2024 04:59 PM EST RP Workstation: [id number]     Procedures   Medications Ordered in the ED - No data to display                                  Medical Decision Making  Patient presents to the ED for concern of foot pain,  this involves an extensive number of treatment options, and is a complaint that carries with it a high risk of complications and morbidity.  The differential diagnosis includes chronic pain, fracture, dislocation, soft tissue injury, etc.   Co morbidities that complicate the patient evaluation  Per medical chart past medical history significant for severe cannabis use disorder, polysubstance abuse, homelessness, major depressive disorder, malingering, substance-induced mood disorder, hypertension, etc.   Additional history obtained:  Reviewed previous emergency department visits for patient was also seen for bilateral foot pain and cold exposure, including visits as recent as this month, also reviewed ED care plan   Imaging Studies ordered:  I ordered imaging studies including x-rays of bilateral feet I independently visualized and interpreted imaging which showed no acute findings I agree with the radiologist interpretation   Problem List / ED Course:  43 year old male, well-known to the emergency department with care plan in place, presents for bilateral foot pain and also concern of hypothermia per triage note Vitals signs at this time are stable When I entered the room patient refused to give me any history or allow for a physical examination, I repeatedly asked the patient if he was here for foot pain, patient currently denies foot pain at this time.  I asked the patient to perform a physical exam of his feet, patient states they already took x-rays you do not need to look at my feet.  I attempted to explain to the patient that I cannot see the skin on his feet with an x-ray and I want to rule out any possibility of soft tissue injury or infection.  Patient continued to refuse medical exam.  At this time patient also stated that he no longer had a medical complaint. Patient states that you are not old enough to be the doctor, go get the doctor.  I explained to the patient that I am  a certified physician assistant and I would be the one providing his care today.  Patient then continued to refuse to give a history or allow for a physical examination, at this time patient got up and walked out of the emergency department in no acute distress with normal gait.  I was not able to perform a physical examination or get a proper history from this patient however doubt acute life-threatening illness Suspicious that this visit is similar to previous visits where patient was seen for chronic lower extremity pain and cold exposure, and activity is consistent with possible malingering I was unable to give return precautions or further evaluate this patient Patient left the emergency department despite being offered physical exam  Social Determinants of Health:  Homelessness, ED care plan, concerns for malingering   Dispostion:  I was unable to fully evaluate this patient or  obtain a proper history, patient left the emergency department     Final diagnoses:  Pain in both feet  Malingering    ED Discharge Orders     None          Janetta Terrall [place], [place] 06/30/24 1925  "

## 2024-07-01 ENCOUNTER — Emergency Department (HOSPITAL_COMMUNITY)
Admission: EM | Admit: 2024-07-01 | Discharge: 2024-07-02 | Discharge status: Against Medical Advice | Attending: Emergency Medicine | Admitting: Emergency Medicine

## 2024-07-01 DIAGNOSIS — H538 Other visual disturbances: Secondary | ICD-10-CM | POA: Insufficient documentation

## 2024-07-01 DIAGNOSIS — Z5321 Procedure and treatment not carried out due to patient leaving prior to being seen by health care provider: Secondary | ICD-10-CM | POA: Diagnosis not present

## 2024-07-01 DIAGNOSIS — X31XXXA Exposure to excessive natural cold, initial encounter: Secondary | ICD-10-CM | POA: Insufficient documentation

## 2024-07-01 NOTE — ED Triage Notes (Signed)
 Quick triage note, pt here stating he is cold, just left hospital lobby.

## 2024-07-02 ENCOUNTER — Ambulatory Visit (HOSPITAL_COMMUNITY)
Admission: EM | Admit: 2024-07-02 | Discharge: 2024-07-03 | Disposition: A | Discharge status: Other Acute Inpatient Hospital | Attending: Psychiatry | Admitting: Psychiatry

## 2024-07-02 DIAGNOSIS — Z91148 Patient's other noncompliance with medication regimen for other reason: Secondary | ICD-10-CM | POA: Diagnosis not present

## 2024-07-02 DIAGNOSIS — F259 Schizoaffective disorder, unspecified: Secondary | ICD-10-CM | POA: Insufficient documentation

## 2024-07-02 DIAGNOSIS — R4182 Altered mental status, unspecified: Secondary | ICD-10-CM | POA: Insufficient documentation

## 2024-07-02 DIAGNOSIS — R4189 Other symptoms and signs involving cognitive functions and awareness: Secondary | ICD-10-CM | POA: Diagnosis not present

## 2024-07-02 LAB — POCT URINE DRUG SCREEN - MANUAL ENTRY (I-SCREEN)
POC Amphetamine UR: NOT DETECTED
POC Buprenorphine (BUP): NOT DETECTED
POC Cocaine UR: NOT DETECTED
POC Marijuana UR: NOT DETECTED
POC Methadone UR: NOT DETECTED
POC Methamphetamine UR: NOT DETECTED
POC Morphine: NOT DETECTED
POC Oxazepam (BZO): NOT DETECTED
POC Oxycodone UR: NOT DETECTED
POC Secobarbital (BAR): NOT DETECTED

## 2024-07-02 MED ORDER — ALUM & MAG HYDROXIDE-SIMETH 200-200-20 MG/5ML PO SUSP: 30.0000 mL | ORAL | Status: DC | PRN

## 2024-07-02 MED ORDER — DIPHENHYDRAMINE HCL 50 MG/ML IJ SOLN: 50.0000 mg | Freq: Three times a day (TID) | INTRAMUSCULAR | Status: DC | PRN

## 2024-07-02 MED ORDER — OLANZAPINE 10 MG PO TBDP
10.0000 mg | ORAL_TABLET | Freq: Two times a day (BID) | ORAL | Status: DC
  Administered 2024-07-02: 10 mg via ORAL
  Filled 2024-07-02: qty 1

## 2024-07-02 MED ORDER — LORAZEPAM 2 MG/ML IJ SOLN: 2.0000 mg | Freq: Three times a day (TID) | INTRAMUSCULAR | Status: DC | PRN

## 2024-07-02 MED ORDER — DIPHENHYDRAMINE HCL 50 MG PO CAPS
50.0000 mg | ORAL_CAPSULE | Freq: Three times a day (TID) | ORAL | Status: DC | PRN
  Administered 2024-07-02: 50 mg via ORAL
  Filled 2024-07-02 (×3): qty 1

## 2024-07-02 MED ORDER — OLANZAPINE 10 MG PO TBDP
10.0000 mg | ORAL_TABLET | Freq: Once | ORAL | Status: AC
  Administered 2024-07-02: 10 mg via ORAL
  Filled 2024-07-02: qty 1

## 2024-07-02 MED ORDER — ACETAMINOPHEN 325 MG PO TABS: 650.0000 mg | ORAL_TABLET | Freq: Four times a day (QID) | ORAL | Status: DC | PRN

## 2024-07-02 MED ORDER — MAGNESIUM HYDROXIDE 400 MG/5ML PO SUSP: 30.0000 mL | Freq: Every day | ORAL | Status: DC | PRN

## 2024-07-02 MED ORDER — HALOPERIDOL LACTATE 5 MG/ML IJ SOLN: 5.0000 mg | Freq: Three times a day (TID) | INTRAMUSCULAR | Status: DC | PRN

## 2024-07-02 MED ORDER — HALOPERIDOL LACTATE 5 MG/ML IJ SOLN: 10.0000 mg | Freq: Three times a day (TID) | INTRAMUSCULAR | Status: DC | PRN

## 2024-07-02 MED ORDER — HALOPERIDOL 5 MG PO TABS
5.0000 mg | ORAL_TABLET | Freq: Three times a day (TID) | ORAL | Status: DC | PRN
  Administered 2024-07-02: 5 mg via ORAL
  Filled 2024-07-02 (×3): qty 1

## 2024-07-02 NOTE — Care Management (Signed)
 [facility] Care Management   Writer referred patient to ACTT services with [facility].  Writer referred patient to [facility]

## 2024-07-02 NOTE — Progress Notes (Signed)
" °   07/02/24 0948  [facility] Triage Screening (Walk-ins at [facility] only)  What Is the Reason for Your Visit/Call Today? PT Jeromiah Ohalloran 15y male escorted by [employer]. Per BHRT the pt was pushing shopping carts in and out of [employer]. PT is uncooperative, tangential thought and speech, refusing to answer triage questions directly. PT denies SI, HI, AVH. Unable to obtain answer from pt for alcohol and substance use.  Have You Recently Had Any Thoughts About Hurting Yourself? No  Are You Planning to Commit Suicide/Harm Yourself At This time? No  Have you Recently Had Thoughts About Hurting Someone [name]? No  Are You Planning To Harm Someone At This Time? No  Physical Abuse  (PT refused to answer)  Verbal Abuse  (PT refused to answer)  Sexual Abuse  (PT refused to answer)  Exploitation of patient/patient's resources  (PT unable to provide answer)  Self-Neglect  (PT unable to answer)  Are you currently experiencing any auditory, visual or other hallucinations? No (PT would not answer the question directly, but named what he saw and heard in the assessment room, based off of what he stated the pt is not experiencing hallucinations)  Have You Used Any Alcohol or Drugs in the Past 24 Hours?  (PT would not answer the question)  Do you have any current medical co-morbidities that require immediate attention?  (PT would not answer the question)  Clinician description of patient physical appearance/behavior: uncooperative, tangential, unable to answer questions  What Do You Feel Would Help You the Most Today?  (PT could not answer rationally, mentioning frequencies and having power in his right arm)  Determination of Need Urgent (48 hours)  Options For Referral Intensive Outpatient Therapy;BH Urgent Care;Inpatient Hospitalization;Medication Management  Determination of Need filed? Yes    "

## 2024-07-02 NOTE — ED Notes (Signed)
 Patient has been escorted out by police.

## 2024-07-02 NOTE — ED Notes (Signed)
 Pt asleep at this time. Psnacks provided. Pt use bathroom. No signs of acute distress. Continues to be irritable while awake.

## 2024-07-02 NOTE — ED Notes (Signed)
 Pt sleeping at present, no distress noted.  Monitoring for safety.
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
## 2024-07-02 NOTE — ED Provider Notes (Signed)
 [facility] Continuous Assessment Admission H&P  Date: 07/02/2024 Patient Name: Aaron Rubio MRN: [id number] Chief Complaint: My license, ID, car, drive, ..  Diagnoses:  Final diagnoses:  Disorganized thought process    HPI: Per triage: PT Aaron Rubio 43y male escorted by [employer]/BHRT. Per BHRT the pt was pushing shopping carts in and out of [employer]. PT is uncooperative, tangential thought and speech, refusing to answer triage questions directly. PT denies SI, HI, AVH. Unable to obtain answer from pt for alcohol and substance use.     Per chart review, patient is known to the system for his frequent visits/malingering, but also has a hx of polysubstance abuse, MDD, Schizoaffective Disorder. His medication compliance is unclear.   His most recent psychiatric evaluation on 06/29/2024 indicates that Pt is disorganized and has been noncompliant with treatment chronically. He speaks of a child psychotherapist and a deceased Financial Planner. He is disorganized and does not comply with medication offered. He is not violent but is paranoid and hostile at times. A long term placement with forced medication and abstinence from substances would be required to improve symptoms. Since long-term psychiatric care with involuntary medication is not available we are unable to recommend short-term hospitalization as there is not an expectancy of benefit and he will likely be discharged in the same condition as he is currently  On  face-to-face assessment today: patient presents with similar symptoms but more acute: presents with increased delusions and paranoid thinking. He is tangential and intrusive. When asked why he is here, he responds my license, ID, drive, passenger seat. He is disorganized and unable to make up a meaningful statement.  He is talking about red roaches, [employer], toxins in the environment, etc. He is making funny faces. He is rude and intrusive.  Not very cooperative with the  assessment.  He refuses EKG but willing to be admitted to observation unit stating give me those medicines....   Based on the above presenting symptoms, admission to Observation unit is recommended for overnight monitoring.  Provider coordinated with SW for possible referrals to ACTT services. We will reevaluate in AM to determine disposition.     Total Time spent with patient: 45 minutes  Musculoskeletal  Strength & Muscle Tone: within normal limits Gait & Station: normal Patient leans: N/A  Psychiatric Specialty Exam  Presentation General Appearance:  Bizarre; Disheveled  Eye Contact: Other (comment) (intense)  Speech: Pressured  Speech Volume: Increased  Handedness: Right   Mood and Affect  Mood: Labile  Affect: Labile   Thought Process  Thought Processes: Disorganized; Irrevelant  Descriptions of Associations:Tangential  Orientation:Partial  Thought Content:Illogical; Obsessions; Delusions; Paranoid Ideation; Scattered; Tangential  Diagnosis of Schizophrenia or Schizoaffective disorder in past: Yes  Duration of Psychotic Symptoms: Greater than six months  Hallucinations:Hallucinations: None  Ideas of Reference:Paranoia; Percusatory; Other (comment) (grandeur)  Suicidal Thoughts:Suicidal Thoughts: No  Homicidal Thoughts:Homicidal Thoughts: No   Sensorium  Memory: Immediate Poor; Recent Poor; Remote Poor  Judgment: Impaired  Insight: Poor   Executive Functions  Concentration: Poor  Attention Span: Poor  Recall: Poor  Fund of Knowledge: Poor  Language: Fair   Psychomotor Activity  Psychomotor Activity: Psychomotor Activity: Restlessness   Assets  Assets: Physical Health; Resilience   Sleep  Sleep: Sleep: -- (UTA)   Nutritional Assessment (For OBS and FBC admissions only) Has the patient had a weight loss or gain of 10 pounds or more in the last 3 months?: -- (UTA)    Physical Exam ROS  Blood pressure (!)  156/101, temperature 98.1 F (36.7 C), temperature source Oral, resp. rate 16. There is no height or weight on file to calculate BMI.  Past Psychiatric History: Polysubstance abuse, homelessness, malingering,  Schizoaffective Disorder  Is the patient at risk to self? No  Has the patient been a risk to self in the past 6 months? No .    Has the patient been a risk to self within the distant past? No   Is the patient a risk to others? No   Has the patient been a risk to others in the past 6 months? No   Has the patient been a risk to others within the distant past? No   Past Medical History: NA  Family History: NA  Social History: Homeless  Last Labs:  Admission on 06/28/2024, Discharged on 06/29/2024  Component Date Value Ref Range Status   WBC 06/28/2024 9.6  4.0 - 10.5 K/uL Final   RBC 06/28/2024 5.39  4.22 - 5.81 MIL/uL Final   Hemoglobin 06/28/2024 14.3  13.0 - 17.0 g/dL Final   HCT 87/72/7974 43.9  39.0 - 52.0 % Final   MCV 06/28/2024 81.4  80.0 - 100.0 fL Final   MCH 06/28/2024 26.5  26.0 - 34.0 pg Final   MCHC 06/28/2024 32.6  30.0 - 36.0 g/dL Final   RDW 87/72/7974 14.0  11.5 - 15.5 % Final   Platelets 06/28/2024 278  150 - 400 K/uL Final   nRBC 06/28/2024 0.0  0.0 - 0.2 % Final   Neutrophils Relative % 06/28/2024 62  % Final   Neutro Abs 06/28/2024 6.0  1.7 - 7.7 K/uL Final   Lymphocytes Relative 06/28/2024 24  % Final   Lymphs Abs 06/28/2024 2.3  0.7 - 4.0 K/uL Final   Monocytes Relative 06/28/2024 9  % Final   Monocytes Absolute 06/28/2024 0.9  0.1 - 1.0 K/uL Final   Eosinophils Relative 06/28/2024 4  % Final   Eosinophils Absolute 06/28/2024 0.3  0.0 - 0.5 K/uL Final   Basophils Relative 06/28/2024 1  % Final   Basophils Absolute 06/28/2024 0.1  0.0 - 0.1 K/uL Final   Immature Granulocytes 06/28/2024 0  % Final   Abs Immature Granulocytes 06/28/2024 0.03  0.00 - 0.07 K/uL Final   Performed at [facility] Lab, [address] [place], [place] [address]    Sodium 06/28/2024 142  135 - 145 mmol/L Final   Potassium 06/28/2024 4.4  3.5 - 5.1 mmol/L Final   Chloride 06/28/2024 105  98 - 111 mmol/L Final   CO2 06/28/2024 27  22 - 32 mmol/L Final   Glucose, Bld 06/28/2024 80  70 - 99 mg/dL Final   Glucose reference range applies only to samples taken after fasting for at least 8 hours.   BUN 06/28/2024 25 (H)  6 - 20 mg/dL Final   Creatinine, Ser 06/28/2024 1.33 (H)  0.61 - 1.24 mg/dL Final   Calcium 87/72/7974 9.7  8.9 - 10.3 mg/dL Final   Total Protein 87/72/7974 6.9  6.5 - 8.1 g/dL Final   Albumin 87/72/7974 4.5  3.5 - 5.0 g/dL Final   AST 87/72/7974 38  15 - 41 U/L Final   ALT 06/28/2024 29  0 - 44 U/L Final   Alkaline Phosphatase 06/28/2024 87  38 - 126 U/L Final   Total Bilirubin 06/28/2024 0.5  0.0 - 1.2 mg/dL Final   GFR, Estimated 06/28/2024 >60  >60 mL/min Final   Comment: (NOTE) Calculated using the  CKD-EPI Creatinine Equation (2021)    Anion gap 06/28/2024 11  5 - 15 Final   Performed at [facility] Lab, [address], [place], [place] [address]   Alcohol, Ethyl (B) 06/28/2024 <15  <15 mg/dL Final   Comment: (NOTE) For medical purposes only. Performed at [facility] Lab, [address], [place], [place] [address]    POC Amphetamine UR 06/29/2024 Positive (A)  NONE DETECTED (Cut Off Level 1000 ng/mL) Final   POC Secobarbital (BAR) 06/29/2024 None Detected  NONE DETECTED (Cut Off Level 300 ng/mL) Final   POC Buprenorphine (BUP) 06/29/2024 None Detected  NONE DETECTED (Cut Off Level 10 ng/mL) Final   POC Oxazepam (BZO) 06/29/2024 Positive (A)  NONE DETECTED (Cut Off Level 300 ng/mL) Final   POC Cocaine UR 06/29/2024 Positive (A)  NONE DETECTED (Cut Off Level 300 ng/mL) Final   POC Methamphetamine UR 06/29/2024 None Detected  NONE DETECTED (Cut Off Level 1000 ng/mL) Final   POC Morphine 06/29/2024 None Detected  NONE DETECTED (Cut Off Level 300 ng/mL) Final   POC Methadone UR 06/29/2024 None Detected  NONE DETECTED (Cut Off  Level 300 ng/mL) Final   POC Oxycodone UR 06/29/2024 None Detected  NONE DETECTED (Cut Off Level 100 ng/mL) Final   POC Marijuana UR 06/29/2024 Positive (A)  NONE DETECTED (Cut Off Level 50 ng/mL) Final  Admission on 06/12/2024, Discharged on 06/12/2024  Component Date Value Ref Range Status   WBC 06/12/2024 10.0  4.0 - 10.5 K/uL Final   RBC 06/12/2024 5.63  4.22 - 5.81 MIL/uL Final   Hemoglobin 06/12/2024 14.8  13.0 - 17.0 g/dL Final   HCT 87/88/7974 45.5  39.0 - 52.0 % Final   MCV 06/12/2024 80.8  80.0 - 100.0 fL Final   MCH 06/12/2024 26.3  26.0 - 34.0 pg Final   MCHC 06/12/2024 32.5  30.0 - 36.0 g/dL Final   RDW 87/88/7974 14.1  11.5 - 15.5 % Final   Platelets 06/12/2024 289  150 - 400 K/uL Final   nRBC 06/12/2024 0.0  0.0 - 0.2 % Final   Neutrophils Relative % 06/12/2024 82  % Final   Neutro Abs 06/12/2024 8.1 (H)  1.7 - 7.7 K/uL Final   Lymphocytes Relative 06/12/2024 9  % Final   Lymphs Abs 06/12/2024 0.9  0.7 - 4.0 K/uL Final   Monocytes Relative 06/12/2024 9  % Final   Monocytes Absolute 06/12/2024 0.9  0.1 - 1.0 K/uL Final   Eosinophils Relative 06/12/2024 0  % Final   Eosinophils Absolute 06/12/2024 0.0  0.0 - 0.5 K/uL Final   Basophils Relative 06/12/2024 0  % Final   Basophils Absolute 06/12/2024 0.0  0.0 - 0.1 K/uL Final   Immature Granulocytes 06/12/2024 0  % Final   Abs Immature Granulocytes 06/12/2024 0.04  0.00 - 0.07 K/uL Final   Performed at [facility] Lab, [address], [place], [place] [address]   Sodium 06/12/2024 133 (L)  135 - 145 mmol/L Final   Potassium 06/12/2024 4.5  3.5 - 5.1 mmol/L Final   Chloride 06/12/2024 97 (L)  98 - 111 mmol/L Final   CO2 06/12/2024 25  22 - 32 mmol/L Final   Glucose, Bld 06/12/2024 73  70 - 99 mg/dL Final   Glucose reference range applies only to samples taken after fasting for at least 8 hours.   BUN 06/12/2024 13  6 - 20 mg/dL Final   Creatinine, Ser 06/12/2024 1.26 (H)  0.61 - 1.24 mg/dL  Final   Calcium 06/12/2024 9.7   8.9 - 10.3 mg/dL Final   Total Protein 87/88/7974 7.5  6.5 - 8.1 g/dL Final   Albumin 87/88/7974 4.6  3.5 - 5.0 g/dL Final   AST 87/88/7974 49 (H)  15 - 41 U/L Final   ALT 06/12/2024 27  0 - 44 U/L Final   Alkaline Phosphatase 06/12/2024 78  38 - 126 U/L Final   Total Bilirubin 06/12/2024 1.5 (H)  0.0 - 1.2 mg/dL Final   GFR, Estimated 06/12/2024 >60  >60 mL/min Final   Comment: (NOTE) Calculated using the CKD-EPI Creatinine Equation (2021)    Anion gap 06/12/2024 11  5 - 15 Final   Performed at [facility] Lab, [address], [place], [place] [address]   POC Amphetamine UR 06/12/2024 Positive (A)  NONE DETECTED (Cut Off Level 1000 ng/mL) Final   POC Secobarbital (BAR) 06/12/2024 None Detected  NONE DETECTED (Cut Off Level 300 ng/mL) Final   POC Buprenorphine (BUP) 06/12/2024 None Detected  NONE DETECTED (Cut Off Level 10 ng/mL) Final   POC Oxazepam (BZO) 06/12/2024 None Detected  NONE DETECTED (Cut Off Level 300 ng/mL) Final   POC Cocaine UR 06/12/2024 None Detected  NONE DETECTED (Cut Off Level 300 ng/mL) Final   POC Methamphetamine UR 06/12/2024 Positive (A)  NONE DETECTED (Cut Off Level 1000 ng/mL) Final   POC Morphine 06/12/2024 None Detected  NONE DETECTED (Cut Off Level 300 ng/mL) Final   POC Methadone UR 06/12/2024 None Detected  NONE DETECTED (Cut Off Level 300 ng/mL) Final   POC Oxycodone UR 06/12/2024 None Detected  NONE DETECTED (Cut Off Level 100 ng/mL) Final   POC Marijuana UR 06/12/2024 Positive (A)  NONE DETECTED (Cut Off Level 50 ng/mL) Final   Alcohol, Ethyl (B) 06/12/2024 <15  <15 mg/dL Final   Comment: (NOTE) For medical purposes only. Performed at [facility] Lab, [address], [place], [place] [address]   Admission on 06/07/2024, Discharged on 06/07/2024  Component Date Value Ref Range Status   Lipase 06/07/2024 28  11 - 51 U/L Final   Performed at [facility] Lab, [address] [place], [place] [address]   Sodium 06/07/2024 138  135 - 145 mmol/L  Final   Potassium 06/07/2024 4.1  3.5 - 5.1 mmol/L Final   Chloride 06/07/2024 107  98 - 111 mmol/L Final   CO2 06/07/2024 27  22 - 32 mmol/L Final   Glucose, Bld 06/07/2024 66 (L)  70 - 99 mg/dL Final   Glucose reference range applies only to samples taken after fasting for at least 8 hours.   BUN 06/07/2024 15  6 - 20 mg/dL Final   Creatinine, Ser 06/07/2024 1.39 (H)  0.61 - 1.24 mg/dL Final   Calcium 87/93/7974 9.0  8.9 - 10.3 mg/dL Final   Total Protein 87/93/7974 6.2 (L)  6.5 - 8.1 g/dL Final   Albumin 87/93/7974 3.6  3.5 - 5.0 g/dL Final   AST 87/93/7974 26  15 - 41 U/L Final   ALT 06/07/2024 17  0 - 44 U/L Final   Alkaline Phosphatase 06/07/2024 62  38 - 126 U/L Final   Total Bilirubin 06/07/2024 0.2  0.0 - 1.2 mg/dL Final   GFR, Estimated 06/07/2024 >60  >60 mL/min Final   Comment: (NOTE) Calculated using the CKD-EPI Creatinine Equation (2021)    Anion gap 06/07/2024 4 (L)  5 - 15 Final   Performed at [facility] Lab, [address],  [place], [place] [address]   WBC 06/07/2024 5.9  4.0 - 10.5 K/uL Final   RBC 06/07/2024 5.25  4.22 - 5.81 MIL/uL Final   Hemoglobin 06/07/2024 13.7  13.0 - 17.0 g/dL Final   HCT 87/93/7974 43.0  39.0 - 52.0 % Final   MCV 06/07/2024 81.9  80.0 - 100.0 fL Final   MCH 06/07/2024 26.1  26.0 - 34.0 pg Final   MCHC 06/07/2024 31.9  30.0 - 36.0 g/dL Final   RDW 87/93/7974 13.8  11.5 - 15.5 % Final   Platelets 06/07/2024 220  150 - 400 K/uL Final   nRBC 06/07/2024 0.0  0.0 - 0.2 % Final   Performed at [facility] Lab, [address], [place], [place] [address]   Color, Urine 06/07/2024 YELLOW  YELLOW Final   APPearance 06/07/2024 CLOUDY (A)  CLEAR Final   Specific Gravity, Urine 06/07/2024 1.015  1.005 - 1.030 Final   pH 06/07/2024 8.0  5.0 - 8.0 Final   Glucose, UA 06/07/2024 NEGATIVE  NEGATIVE mg/dL Final   Hgb urine dipstick 06/07/2024 NEGATIVE  NEGATIVE Final   Bilirubin Urine 06/07/2024 NEGATIVE  NEGATIVE Final   Ketones, ur 06/07/2024  NEGATIVE  NEGATIVE mg/dL Final   Protein, ur 87/93/7974 NEGATIVE  NEGATIVE mg/dL Final   Nitrite 87/93/7974 NEGATIVE  NEGATIVE Final   Leukocytes,Ua 06/07/2024 NEGATIVE  NEGATIVE Final   Performed at [facility] Lab, [address], [place], [place] [address]  Admission on 06/02/2024, Discharged on 06/03/2024  Component Date Value Ref Range Status   Sodium 06/02/2024 141  135 - 145 mmol/L Final   Potassium 06/02/2024 3.6  3.5 - 5.1 mmol/L Final   Chloride 06/02/2024 107  98 - 111 mmol/L Final   CO2 06/02/2024 27  22 - 32 mmol/L Final   Glucose, Bld 06/02/2024 82  70 - 99 mg/dL Final   Glucose reference range applies only to samples taken after fasting for at least 8 hours.   BUN 06/02/2024 14  6 - 20 mg/dL Final   Creatinine, Ser 06/02/2024 1.16  0.61 - 1.24 mg/dL Final   Calcium 87/98/7974 8.7 (L)  8.9 - 10.3 mg/dL Final   Total Protein 87/98/7974 6.3 (L)  6.5 - 8.1 g/dL Final   Albumin 87/98/7974 3.7  3.5 - 5.0 g/dL Final   AST 87/98/7974 26  15 - 41 U/L Final   ALT 06/02/2024 18  0 - 44 U/L Final   Alkaline Phosphatase 06/02/2024 59  38 - 126 U/L Final   Total Bilirubin 06/02/2024 0.7  0.0 - 1.2 mg/dL Final   GFR, Estimated 06/02/2024 >60  >60 mL/min Final   Comment: (NOTE) Calculated using the CKD-EPI Creatinine Equation (2021)    Anion gap 06/02/2024 7  5 - 15 Final   Performed at [facility] Lab, [address], [place], [place] [address]   Alcohol, Ethyl (B) 06/02/2024 <15  <15 mg/dL Final   Comment: (NOTE) For medical purposes only. Performed at [facility] Lab, [address], [place], [place] [address]    WBC 06/02/2024 8.1  4.0 - 10.5 K/uL Final   RBC 06/02/2024 5.22  4.22 - 5.81 MIL/uL Final   Hemoglobin 06/02/2024 13.5  13.0 - 17.0 g/dL Final   HCT 87/98/7974 43.3  39.0 - 52.0 % Final   MCV 06/02/2024 83.0  80.0 - 100.0 fL Final   MCH 06/02/2024 25.9 (L)  26.0 - 34.0 pg Final   MCHC 06/02/2024 31.2  30.0 - 36.0 g/dL Final  RDW 06/02/2024 13.8  11.5 -  15.5 % Final   Platelets 06/02/2024 220  150 - 400 K/uL Final   nRBC 06/02/2024 0.0  0.0 - 0.2 % Final   Performed at [facility] Lab, [address], [place], [place] [address]   Opiates 06/02/2024 NONE DETECTED  NONE DETECTED Final   Cocaine 06/02/2024 NONE DETECTED  NONE DETECTED Final   Benzodiazepines 06/02/2024 NONE DETECTED  NONE DETECTED Final   Amphetamines 06/02/2024 NONE DETECTED  NONE DETECTED Final   Tetrahydrocannabinol 06/02/2024 NONE DETECTED  NONE DETECTED Final   Barbiturates 06/02/2024 NONE DETECTED  NONE DETECTED Final   Comment: (NOTE) DRUG SCREEN FOR MEDICAL PURPOSES ONLY.  IF CONFIRMATION IS NEEDED FOR ANY PURPOSE, NOTIFY LAB WITHIN 5 DAYS.  LOWEST DETECTABLE LIMITS FOR URINE DRUG SCREEN Drug Class                     Cutoff (ng/mL) Amphetamine and metabolites    1000 Barbiturate and metabolites    200 Benzodiazepine                 200 Opiates and metabolites        300 Cocaine and metabolites        300 THC                            50 Performed at [facility] Lab, [address] [place], [place] [address]   Admission on 05/06/2024, Discharged on 05/06/2024  Component Date Value Ref Range Status   Sodium 05/06/2024 138  135 - 145 mmol/L Final   Potassium 05/06/2024 3.7  3.5 - 5.1 mmol/L Final   Chloride 05/06/2024 104  98 - 111 mmol/L Final   CO2 05/06/2024 25  22 - 32 mmol/L Final   Glucose, Bld 05/06/2024 89  70 - 99 mg/dL Final   Glucose reference range applies only to samples taken after fasting for at least 8 hours.   BUN 05/06/2024 12  6 - 20 mg/dL Final   Creatinine, Ser 05/06/2024 1.25 (H)  0.61 - 1.24 mg/dL Final   Calcium 88/95/7974 8.9  8.9 - 10.3 mg/dL Final   Total Protein 88/95/7974 6.3 (L)  6.5 - 8.1 g/dL Final   Albumin 88/95/7974 3.6  3.5 - 5.0 g/dL Final   AST 88/95/7974 28  15 - 41 U/L Final   ALT 05/06/2024 20  0 - 44 U/L Final   Alkaline Phosphatase 05/06/2024 54  38 - 126 U/L Final   Total Bilirubin 05/06/2024 0.6  0.0  - 1.2 mg/dL Final   GFR, Estimated 05/06/2024 >60  >60 mL/min Final   Comment: (NOTE) Calculated using the CKD-EPI Creatinine Equation (2021)    Anion gap 05/06/2024 9  5 - 15 Final   Performed at [facility] Lab, [address], [place], [place] [address]   Alcohol, Ethyl (B) 05/06/2024 <15  <15 mg/dL Final   Comment: (NOTE) For medical purposes only. Performed at [facility] Lab, [address] [place], [place] [address]    WBC 05/06/2024 9.6  4.0 - 10.5 K/uL Final   RBC 05/06/2024 5.03  4.22 - 5.81 MIL/uL Final   Hemoglobin 05/06/2024 12.9 (L)  13.0 - 17.0 g/dL Final   HCT 88/95/7974 41.0  39.0 - 52.0 % Final   MCV 05/06/2024 81.5  80.0 - 100.0 fL Final   MCH 05/06/2024 25.6 (L)  26.0 - 34.0 pg  Final   MCHC 05/06/2024 31.5  30.0 - 36.0 g/dL Final   RDW 88/95/7974 13.5  11.5 - 15.5 % Final   Platelets 05/06/2024 265  150 - 400 K/uL Final   nRBC 05/06/2024 0.0  0.0 - 0.2 % Final   Performed at [facility] Lab, [address], [place], [place] [address]   Opiates 05/06/2024 NONE DETECTED  NONE DETECTED Final   Cocaine 05/06/2024 POSITIVE (A)  NONE DETECTED Final   Benzodiazepines 05/06/2024 NONE DETECTED  NONE DETECTED Final   Amphetamines 05/06/2024 NONE DETECTED  NONE DETECTED Final   Tetrahydrocannabinol 05/06/2024 NONE DETECTED  NONE DETECTED Final   Barbiturates 05/06/2024 NONE DETECTED  NONE DETECTED Final   Comment: (NOTE) DRUG SCREEN FOR MEDICAL PURPOSES ONLY.  IF CONFIRMATION IS NEEDED FOR ANY PURPOSE, NOTIFY LAB WITHIN 5 DAYS.  LOWEST DETECTABLE LIMITS FOR URINE DRUG SCREEN Drug Class                     Cutoff (ng/mL) Amphetamine and metabolites    1000 Barbiturate and metabolites    200 Benzodiazepine                 200 Opiates and metabolites        300 Cocaine and metabolites        300 THC                            50 Performed at [facility] Lab, [address], [place], [place] [address]   Admission on 05/03/2024, Discharged on  05/03/2024  Component Date Value Ref Range Status   SARS Coronavirus 2 by RT PCR 05/03/2024 NEGATIVE  NEGATIVE Final   Influenza A by PCR 05/03/2024 NEGATIVE  NEGATIVE Final   Influenza B by PCR 05/03/2024 NEGATIVE  NEGATIVE Final   Comment: (NOTE) The Xpert Xpress SARS-CoV-2/FLU/RSV plus assay is intended as an aid in the diagnosis of influenza from Nasopharyngeal swab specimens and should not be used as a sole basis for treatment. Nasal washings and aspirates are unacceptable for Xpert Xpress SARS-CoV-2/FLU/RSV testing.  Fact Sheet for Patients: bloggercourse.com  Fact Sheet for Healthcare Providers: seriousbroker.it  This test is not yet approved or cleared by the [place]  FDA and has been authorized for detection and/or diagnosis of SARS-CoV-2 by FDA under an Emergency Use Authorization (EUA). This EUA will remain in effect (meaning this test can be used) for the duration of the COVID-19 declaration under Section 564(b)(1) of the Act, 21 U.S.C. section 360bbb-3(b)(1), unless the authorization is terminated or revoked.     Resp Syncytial Virus by PCR 05/03/2024 NEGATIVE  NEGATIVE Final   Comment: (NOTE) Fact Sheet for Patients: bloggercourse.com  Fact Sheet for Healthcare Providers: seriousbroker.it  This test is not yet approved or cleared by the [place]  FDA and has been authorized for detection and/or diagnosis of SARS-CoV-2 by FDA under an Emergency Use Authorization (EUA). This EUA will remain in effect (meaning this test can be used) for the duration of the COVID-19 declaration under Section 564(b)(1) of the Act, 21 U.S.C. section 360bbb-3(b)(1), unless the authorization is terminated or revoked.  Performed at [facility] Lab, [address] [place], [place] [address]   Admission on 05/01/2024, Discharged on 05/02/2024  Component Date Value Ref Range  Status   Sodium 05/01/2024 137  135 - 145 mmol/L Final   Potassium 05/01/2024 4.0  3.5 - 5.1 mmol/L Final  Chloride 05/01/2024 103  98 - 111 mmol/L Final   CO2 05/01/2024 23  22 - 32 mmol/L Final   Glucose, Bld 05/01/2024 98  70 - 99 mg/dL Final   Glucose reference range applies only to samples taken after fasting for at least 8 hours.   BUN 05/01/2024 27 (H)  6 - 20 mg/dL Final   Creatinine, Ser 05/01/2024 1.36 (H)  0.61 - 1.24 mg/dL Final   Calcium 89/69/7974 8.8 (L)  8.9 - 10.3 mg/dL Final   Total Protein 89/69/7974 5.8 (L)  6.5 - 8.1 g/dL Final   Albumin 89/69/7974 3.5  3.5 - 5.0 g/dL Final   AST 89/69/7974 46 (H)  15 - 41 U/L Final   ALT 05/01/2024 25  0 - 44 U/L Final   Alkaline Phosphatase 05/01/2024 59  38 - 126 U/L Final   Total Bilirubin 05/01/2024 0.7  0.0 - 1.2 mg/dL Final   GFR, Estimated 05/01/2024 >60  >60 mL/min Final   Comment: (NOTE) Calculated using the CKD-EPI Creatinine Equation (2021)    Anion gap 05/01/2024 11  5 - 15 Final   Performed at [facility] Lab, [address] [place], [place] [address]   Alcohol, Ethyl (B) 05/01/2024 <15  <15 mg/dL Final   Comment: (NOTE) For medical purposes only. Performed at [facility] Lab, [address], [place], [place] [address]    WBC 05/01/2024 10.9 (H)  4.0 - 10.5 K/uL Final   RBC 05/01/2024 5.01  4.22 - 5.81 MIL/uL Final   Hemoglobin 05/01/2024 13.0  13.0 - 17.0 g/dL Final   HCT 89/69/7974 40.9  39.0 - 52.0 % Final   MCV 05/01/2024 81.6  80.0 - 100.0 fL Final   MCH 05/01/2024 25.9 (L)  26.0 - 34.0 pg Final   MCHC 05/01/2024 31.8  30.0 - 36.0 g/dL Final   RDW 89/69/7974 13.9  11.5 - 15.5 % Final   Platelets 05/01/2024 248  150 - 400 K/uL Final   nRBC 05/01/2024 0.0  0.0 - 0.2 % Final   Neutrophils Relative % 05/01/2024 59  % Final   Neutro Abs 05/01/2024 6.5  1.7 - 7.7 K/uL Final   Lymphocytes Relative 05/01/2024 25  % Final   Lymphs Abs 05/01/2024 2.7  0.7 - 4.0 K/uL Final   Monocytes Relative 05/01/2024  11  % Final   Monocytes Absolute 05/01/2024 1.2 (H)  0.1 - 1.0 K/uL Final   Eosinophils Relative 05/01/2024 3  % Final   Eosinophils Absolute 05/01/2024 0.4  0.0 - 0.5 K/uL Final   Basophils Relative 05/01/2024 1  % Final   Basophils Absolute 05/01/2024 0.1  0.0 - 0.1 K/uL Final   Immature Granulocytes 05/01/2024 1  % Final   Abs Immature Granulocytes 05/01/2024 0.05  0.00 - 0.07 K/uL Final   Performed at [facility], [address] [place], [place] [address]  Admission on 04/22/2024, Discharged on 04/24/2024  Component Date Value Ref Range Status   Opiates 04/23/2024 NEGATIVE  NEGATIVE Final   Cocaine 04/23/2024 POSITIVE (A)  NEGATIVE Final   Benzodiazepines 04/23/2024 NEGATIVE  NEGATIVE Final   Amphetamines 04/23/2024 NEGATIVE  NEGATIVE Final   Tetrahydrocannabinol 04/23/2024 NEGATIVE  NEGATIVE Final   Barbiturates 04/23/2024 NEGATIVE  NEGATIVE Final   Methadone Scn, Ur 04/23/2024 NEGATIVE  NEGATIVE Final   Fentanyl  04/23/2024 NEGATIVE  NEGATIVE Final   Comment: (NOTE) Drug screen is for Medical Purposes only. Positive results are preliminary only. If confirmation is needed, notify lab within 5  days.  Drug Class                 Cutoff (ng/mL) Amphetamine and metabolites 1000 Barbiturate and metabolites 200 Benzodiazepine              200 Opiates and metabolites     300 Cocaine and metabolites     300 THC                         50 Fentanyl                     5 Methadone                   300  Trazodone  is metabolized in vivo to several metabolites,  including pharmacologically active m-CPP, which is excreted in the  urine.  Immunoassay screens for amphetamines and MDMA have potential  cross-reactivity with these compounds and may provide false positive  result.  Performed at [facility], [address] [place], [place] [address]    WBC 04/22/2024 13.1 (H)  4.0 - 10.5 K/uL Final   RBC 04/22/2024 5.51  4.22 - 5.81 MIL/uL Final   Hemoglobin  04/22/2024 14.2  13.0 - 17.0 g/dL Final   HCT 89/78/7974 44.8  39.0 - 52.0 % Final   MCV 04/22/2024 81.3  80.0 - 100.0 fL Final   MCH 04/22/2024 25.8 (L)  26.0 - 34.0 pg Final   MCHC 04/22/2024 31.7  30.0 - 36.0 g/dL Final   RDW 89/78/7974 13.6  11.5 - 15.5 % Final   Platelets 04/22/2024 284  150 - 400 K/uL Final   nRBC 04/22/2024 0.0  0.0 - 0.2 % Final   Neutrophils Relative % 04/22/2024 71  % Final   Neutro Abs 04/22/2024 9.3 (H)  1.7 - 7.7 K/uL Final   Lymphocytes Relative 04/22/2024 18  % Final   Lymphs Abs 04/22/2024 2.3  0.7 - 4.0 K/uL Final   Monocytes Relative 04/22/2024 10  % Final   Monocytes Absolute 04/22/2024 1.3 (H)  0.1 - 1.0 K/uL Final   Eosinophils Relative 04/22/2024 0  % Final   Eosinophils Absolute 04/22/2024 0.0  0.0 - 0.5 K/uL Final   Basophils Relative 04/22/2024 1  % Final   Basophils Absolute 04/22/2024 0.1  0.0 - 0.1 K/uL Final   Immature Granulocytes 04/22/2024 0  % Final   Abs Immature Granulocytes 04/22/2024 0.05  0.00 - 0.07 K/uL Final   Performed at [facility], [address] [place], [place] [address]   Sodium 04/22/2024 138  135 - 145 mmol/L Final   Potassium 04/22/2024 4.4  3.5 - 5.1 mmol/L Final   Chloride 04/22/2024 99  98 - 111 mmol/L Final   CO2 04/22/2024 23  22 - 32 mmol/L Final   Glucose, Bld 04/22/2024 73  70 - 99 mg/dL Final   Glucose reference range applies only to samples taken after fasting for at least 8 hours.   BUN 04/22/2024 20  6 - 20 mg/dL Final   Creatinine, Ser 04/22/2024 1.46 (H)  0.61 - 1.24 mg/dL Final   Calcium 89/78/7974 10.2  8.9 - 10.3 mg/dL Final   GFR, Estimated 04/22/2024 >60  >60 mL/min Final   Comment: (NOTE) Calculated using the CKD-EPI Creatinine Equation (2021)    Anion gap 04/22/2024 15  5 - 15 Final   Performed at [facility], [address], [place], [place] [address]  Alcohol, Ethyl (B) 04/22/2024 <15  <15 mg/dL Final   Comment: (NOTE) For medical purposes  only. Performed at [facility], [address], [place], [place] [address]   Admission on 04/19/2024, Discharged on 04/21/2024  Component Date Value Ref Range Status   Sodium 04/20/2024 136  135 - 145 mmol/L Final   Potassium 04/20/2024 3.9  3.5 - 5.1 mmol/L Final   Chloride 04/20/2024 101  98 - 111 mmol/L Final   CO2 04/20/2024 26  22 - 32 mmol/L Final   Glucose, Bld 04/20/2024 86  70 - 99 mg/dL Final   Glucose reference range applies only to samples taken after fasting for at least 8 hours.   BUN 04/20/2024 12  6 - 20 mg/dL Final   Creatinine, Ser 04/20/2024 1.26 (H)  0.61 - 1.24 mg/dL Final   Calcium 89/80/7974 8.9  8.9 - 10.3 mg/dL Final   Total Protein 89/80/7974 5.8 (L)  6.5 - 8.1 g/dL Final   Albumin 89/80/7974 3.4 (L)  3.5 - 5.0 g/dL Final   AST 89/80/7974 25  15 - 41 U/L Final   ALT 04/20/2024 19  0 - 44 U/L Final   Alkaline Phosphatase 04/20/2024 50  38 - 126 U/L Final   Total Bilirubin 04/20/2024 0.7  0.0 - 1.2 mg/dL Final   GFR, Estimated 04/20/2024 >60  >60 mL/min Final   Comment: (NOTE) Calculated using the CKD-EPI Creatinine Equation (2021)    Anion gap 04/20/2024 9  5 - 15 Final   Performed at [facility], [address], [place], [place] [address]   Alcohol, Ethyl (B) 04/20/2024 <15  <15 mg/dL Final   Comment: (NOTE) For medical purposes only. Performed at [facility], [address] [place], [place] [address]    Opiates 04/20/2024 NONE DETECTED  NONE DETECTED Final   Cocaine 04/20/2024 POSITIVE (A)  NONE DETECTED Final   Benzodiazepines 04/20/2024 NONE DETECTED  NONE DETECTED Final   Amphetamines 04/20/2024 NONE DETECTED  NONE DETECTED Final   Tetrahydrocannabinol 04/20/2024 NONE DETECTED  NONE DETECTED Final   Barbiturates 04/20/2024 NONE DETECTED  NONE DETECTED Final   Comment: (NOTE) DRUG SCREEN FOR MEDICAL PURPOSES ONLY.  IF CONFIRMATION IS NEEDED FOR ANY PURPOSE, NOTIFY LAB WITHIN 5 DAYS.  LOWEST DETECTABLE  LIMITS FOR URINE DRUG SCREEN Drug Class                     Cutoff (ng/mL) Amphetamine and metabolites    1000 Barbiturate and metabolites    200 Benzodiazepine                 200 Opiates and metabolites        300 Cocaine and metabolites        300 THC                            50 Performed at [facility], [address], [place], [place] [address]    WBC 04/20/2024 7.9  4.0 - 10.5 K/uL Final   RBC 04/20/2024 5.75  4.22 - 5.81 MIL/uL Final   Hemoglobin 04/20/2024 14.9  13.0 - 17.0 g/dL Final   HCT 89/80/7974 47.2  39.0 - 52.0 % Final   MCV 04/20/2024 82.1  80.0 - 100.0 fL Final   MCH 04/20/2024 25.9 (L)  26.0 - 34.0 pg Final   MCHC 04/20/2024 31.6  30.0 - 36.0 g/dL Final   RDW  04/20/2024 13.6  11.5 - 15.5 % Final   Platelets 04/20/2024 266  150 - 400 K/uL Final   nRBC 04/20/2024 0.0  0.0 - 0.2 % Final   Neutrophils Relative % 04/20/2024 61  % Final   Neutro Abs 04/20/2024 4.8  1.7 - 7.7 K/uL Final   Lymphocytes Relative 04/20/2024 24  % Final   Lymphs Abs 04/20/2024 1.9  0.7 - 4.0 K/uL Final   Monocytes Relative 04/20/2024 9  % Final   Monocytes Absolute 04/20/2024 0.7  0.1 - 1.0 K/uL Final   Eosinophils Relative 04/20/2024 5  % Final   Eosinophils Absolute 04/20/2024 0.4  0.0 - 0.5 K/uL Final   Basophils Relative 04/20/2024 1  % Final   Basophils Absolute 04/20/2024 0.1  0.0 - 0.1 K/uL Final   Immature Granulocytes 04/20/2024 0  % Final   Abs Immature Granulocytes 04/20/2024 0.03  0.00 - 0.07 K/uL Final   Performed at [facility], [address] [place], [place] [address]  Admission on 04/15/2024, Discharged on 04/16/2024  Component Date Value Ref Range Status   Alcohol, Ethyl (B) 04/15/2024 <15  <15 mg/dL Final   Comment: (NOTE) For medical purposes only. Performed at [facility], [address], [place], [place] [address]    TSH 04/15/2024 0.945  0.350 - 4.500 uIU/mL Final   Comment: Performed by a 3rd Generation assay with a functional  sensitivity of <=0.01 uIU/mL. Performed at [facility], [address] [place], [place] [address]    Sodium 04/15/2024 139  135 - 145 mmol/L Final   Potassium 04/15/2024 4.3  3.5 - 5.1 mmol/L Final   Chloride 04/15/2024 105  98 - 111 mmol/L Final   CO2 04/15/2024 26  22 - 32 mmol/L Final   Glucose, Bld 04/15/2024 89  70 - 99 mg/dL Final   Glucose reference range applies only to samples taken after fasting for at least 8 hours.   BUN 04/15/2024 20  6 - 20 mg/dL Final   Creatinine, Ser 04/15/2024 1.23  0.61 - 1.24 mg/dL Final   Calcium 89/85/7974 8.7 (L)  8.9 - 10.3 mg/dL Final   Total Protein 89/85/7974 5.1 (L)  6.5 - 8.1 g/dL Final   Albumin 89/85/7974 3.1 (L)  3.5 - 5.0 g/dL Final   AST 89/85/7974 29  15 - 41 U/L Final   ALT 04/15/2024 21  0 - 44 U/L Final   Alkaline Phosphatase 04/15/2024 55  38 - 126 U/L Final   Total Bilirubin 04/15/2024 0.3  0.0 - 1.2 mg/dL Final   GFR, Estimated 04/15/2024 >60  >60 mL/min Final   Comment: (NOTE) Calculated using the CKD-EPI Creatinine Equation (2021)    Anion gap 04/15/2024 8  5 - 15 Final   Performed at [facility], [address] [place], [place] [address]   WBC 04/15/2024 7.6  4.0 - 10.5 K/uL Final   RBC 04/15/2024 4.97  4.22 - 5.81 MIL/uL Final   Hemoglobin 04/15/2024 12.9 (L)  13.0 - 17.0 g/dL Final   HCT 89/85/7974 40.7  39.0 - 52.0 % Final   MCV 04/15/2024 81.9  80.0 - 100.0 fL Final   MCH 04/15/2024 26.0  26.0 - 34.0 pg Final   MCHC 04/15/2024 31.7  30.0 - 36.0 g/dL Final   RDW 89/85/7974 13.7  11.5 - 15.5 % Final   Platelets 04/15/2024 239  150 - 400 K/uL Final   nRBC 04/15/2024 0.0  0.0 - 0.2 % Final  Neutrophils Relative % 04/15/2024 51  % Final   Neutro Abs 04/15/2024 3.8  1.7 - 7.7 K/uL Final   Lymphocytes Relative 04/15/2024 32  % Final   Lymphs Abs 04/15/2024 2.4  0.7 - 4.0 K/uL Final   Monocytes Relative 04/15/2024 9  % Final   Monocytes Absolute 04/15/2024 0.7  0.1 - 1.0 K/uL Final   Eosinophils Relative  04/15/2024 7  % Final   Eosinophils Absolute 04/15/2024 0.6 (H)  0.0 - 0.5 K/uL Final   Basophils Relative 04/15/2024 1  % Final   Basophils Absolute 04/15/2024 0.1  0.0 - 0.1 K/uL Final   Immature Granulocytes 04/15/2024 0  % Final   Abs Immature Granulocytes 04/15/2024 0.02  0.00 - 0.07 K/uL Final   Performed at [facility], [address] [place], [place] [address]   POC Amphetamine UR 04/15/2024 None Detected  NONE DETECTED (Cut Off Level 1000 ng/mL) Final   POC Secobarbital (BAR) 04/15/2024 None Detected  NONE DETECTED (Cut Off Level 300 ng/mL) Final   POC Buprenorphine (BUP) 04/15/2024 None Detected  NONE DETECTED (Cut Off Level 10 ng/mL) Final   POC Oxazepam (BZO) 04/15/2024 None Detected (A)  NONE DETECTED (Cut Off Level 300 ng/mL) Final   POC Cocaine UR 04/15/2024 Positive (A)  NONE DETECTED (Cut Off Level 300 ng/mL) Final   POC Methamphetamine UR 04/15/2024 None Detected  NONE DETECTED (Cut Off Level 1000 ng/mL) Final   POC Morphine 04/15/2024 None Detected  NONE DETECTED (Cut Off Level 300 ng/mL) Final   POC Methadone UR 04/15/2024 None Detected  NONE DETECTED (Cut Off Level 300 ng/mL) Final   POC Oxycodone UR 04/15/2024 None Detected  NONE DETECTED (Cut Off Level 100 ng/mL) Final   POC Marijuana UR 04/15/2024 None Detected  NONE DETECTED (Cut Off Level 50 ng/mL) Final  There may be more visits with results that are not included.    Allergies: Patient has no known allergies.  Medications:  Facility Ordered Medications  Medication   acetaminophen  (TYLENOL ) tablet 650 mg   alum & mag hydroxide-simeth (MAALOX/MYLANTA) 200-200-20 MG/5ML suspension 30 mL   magnesium  hydroxide (MILK OF MAGNESIA) suspension 30 mL   haloperidol  (HALDOL ) tablet 5 mg   And   diphenhydrAMINE  (BENADRYL ) capsule 50 mg   haloperidol  lactate (HALDOL ) injection 5 mg   And   diphenhydrAMINE  (BENADRYL ) injection 50 mg   And   LORazepam  (ATIVAN ) injection 2 mg   haloperidol  lactate (HALDOL )  injection 10 mg   And   diphenhydrAMINE  (BENADRYL ) injection 50 mg   And   LORazepam  (ATIVAN ) injection 2 mg   OLANZapine  zydis (ZYPREXA ) disintegrating tablet 10 mg   PTA Medications  Medication Sig   risperiDONE  (RISPERDAL  M-TABS) 1 MG disintegrating tablet Take 1 tablet (1 mg total) by mouth daily.   risperiDONE  (RISPERDAL  M-TABS) 2 MG disintegrating tablet Take 1 tablet (2 mg total) by mouth at bedtime.      Medical Decision Making  Admission to Observation unit for overnight monitoring. Reevaluation in AM to determine disposition  ACTT services recommended.  Agitation protocol EKG Acetaminophen  650 mg PO Q 6 PRN for mild to moderate pain Maalox 30 ml PO Q 4 for indigestion Milk of magnesia 30 ml PO Daily PRN for mild constipation Olanzapine  ODT 10 mg PO BID for acute psychosis  UDS    Recommendations  Based on my evaluation the patient does not appear to have an emergency medical condition.  [name], NP 07/02/2024  10:41 AM

## 2024-07-02 NOTE — ED Notes (Signed)
 Patient admitted to observation unit . Upon arrival, patient exhibited significant agitation and disorganized behavior; Zyprexa  10 mg administered with minimal immediate effect noted. Patient unable to participate meaningfully in suicide risk assessment at this time due to severe confusion and lack of cooperation. Patient required extensive redirection but was ultimately able to change into hospital scrubs with staff assistance. Throughout attempted assessment, patient remained uncooperative and unable to answer basic questions. Speech was continuous, pressured, and markedly disorganized, with rapid shifts between unrelated topics; content largely unintelligible. Patient appeared alert but  confused, with impaired attention, concentration, and thought process. Thought process observed to be tangential and incoherent. Insight and judgment appear significantly impaired. Patient unable to demonstrate understanding of situation or respond appropriately to staff. At this time, patient is considered unable to engage in assessment; continued close observation indicated for safety

## 2024-07-03 ENCOUNTER — Inpatient Hospital Stay (HOSPITAL_COMMUNITY)
Admission: AD | Admit: 2024-07-03 | Discharge: 2024-07-14 | DRG: 885 | Disposition: A | Discharge status: Home / Self Care | Source: Intra-hospital

## 2024-07-03 DIAGNOSIS — Z5900 Homelessness unspecified: Secondary | ICD-10-CM | POA: Diagnosis not present

## 2024-07-03 DIAGNOSIS — F1410 Cocaine abuse, uncomplicated: Secondary | ICD-10-CM | POA: Diagnosis present

## 2024-07-03 DIAGNOSIS — F259 Schizoaffective disorder, unspecified: Principal | ICD-10-CM | POA: Diagnosis present

## 2024-07-03 DIAGNOSIS — F1510 Other stimulant abuse, uncomplicated: Secondary | ICD-10-CM | POA: Diagnosis present

## 2024-07-03 DIAGNOSIS — Z9151 Personal history of suicidal behavior: Secondary | ICD-10-CM | POA: Diagnosis not present

## 2024-07-03 DIAGNOSIS — Z79899 Other long term (current) drug therapy: Secondary | ICD-10-CM | POA: Diagnosis not present

## 2024-07-03 DIAGNOSIS — I10 Essential (primary) hypertension: Secondary | ICD-10-CM | POA: Diagnosis present

## 2024-07-03 DIAGNOSIS — F17210 Nicotine dependence, cigarettes, uncomplicated: Secondary | ICD-10-CM | POA: Diagnosis present

## 2024-07-03 DIAGNOSIS — Z91148 Patient's other noncompliance with medication regimen for other reason: Secondary | ICD-10-CM | POA: Diagnosis not present

## 2024-07-03 DIAGNOSIS — F1210 Cannabis abuse, uncomplicated: Secondary | ICD-10-CM | POA: Diagnosis present

## 2024-07-03 DIAGNOSIS — F17290 Nicotine dependence, other tobacco product, uncomplicated: Secondary | ICD-10-CM | POA: Diagnosis present

## 2024-07-03 DIAGNOSIS — Z5982 Transportation insecurity: Secondary | ICD-10-CM

## 2024-07-03 DIAGNOSIS — F250 Schizoaffective disorder, bipolar type: Principal | ICD-10-CM | POA: Diagnosis present

## 2024-07-03 MED ORDER — HYDROXYZINE HCL 50 MG PO TABS
50.0000 mg | ORAL_TABLET | Freq: Three times a day (TID) | ORAL | Status: DC | PRN
  Administered 2024-07-04 – 2024-07-13 (×6): 50 mg via ORAL
  Filled 2024-07-03 (×11): qty 1

## 2024-07-03 MED ORDER — HALOPERIDOL LACTATE 5 MG/ML IJ SOLN: 10.0000 mg | Freq: Three times a day (TID) | INTRAMUSCULAR | Status: DC | PRN

## 2024-07-03 MED ORDER — TRAZODONE HCL 50 MG PO TABS
50.0000 mg | ORAL_TABLET | Freq: Every evening | ORAL | Status: DC | PRN
  Administered 2024-07-05 – 2024-07-13 (×7): 50 mg via ORAL
  Filled 2024-07-03 (×8): qty 1

## 2024-07-03 MED ORDER — HALOPERIDOL 5 MG PO TABS
5.0000 mg | ORAL_TABLET | Freq: Three times a day (TID) | ORAL | Status: DC | PRN
  Administered 2024-07-04: 5 mg via ORAL
  Filled 2024-07-03: qty 1

## 2024-07-03 MED ORDER — DIPHENHYDRAMINE HCL 50 MG/ML IJ SOLN: 50.0000 mg | Freq: Three times a day (TID) | INTRAMUSCULAR | Status: DC | PRN

## 2024-07-03 MED ORDER — OLANZAPINE 15 MG PO TBDP
15.0000 mg | ORAL_TABLET | Freq: Two times a day (BID) | ORAL | Status: DC
  Administered 2024-07-03: 15 mg via ORAL
  Filled 2024-07-03: qty 1

## 2024-07-03 MED ORDER — ACETAMINOPHEN 325 MG PO TABS
650.0000 mg | ORAL_TABLET | Freq: Four times a day (QID) | ORAL | Status: DC | PRN
  Filled 2024-07-03: qty 2

## 2024-07-03 MED ORDER — ALUM & MAG HYDROXIDE-SIMETH 200-200-20 MG/5ML PO SUSP: 30.0000 mL | ORAL | Status: DC | PRN

## 2024-07-03 MED ORDER — MAGNESIUM HYDROXIDE 400 MG/5ML PO SUSP: 30.0000 mL | Freq: Every day | ORAL | Status: DC | PRN

## 2024-07-03 MED ORDER — LORAZEPAM 2 MG/ML IJ SOLN: 2.0000 mg | Freq: Three times a day (TID) | INTRAMUSCULAR | Status: DC | PRN

## 2024-07-03 MED ORDER — HALOPERIDOL LACTATE 5 MG/ML IJ SOLN: 5.0000 mg | Freq: Three times a day (TID) | INTRAMUSCULAR | Status: DC | PRN

## 2024-07-03 MED ORDER — OLANZAPINE 15 MG PO TBDP
15.0000 mg | ORAL_TABLET | Freq: Two times a day (BID) | ORAL | Status: DC
  Administered 2024-07-03 – 2024-07-09 (×12): 15 mg via ORAL
  Filled 2024-07-03 (×12): qty 1

## 2024-07-03 MED ORDER — OLANZAPINE 15 MG PO TBDP
ORAL_TABLET | ORAL | Status: AC
  Filled 2024-07-03: qty 1

## 2024-07-03 MED ORDER — DIPHENHYDRAMINE HCL 25 MG PO CAPS
50.0000 mg | ORAL_CAPSULE | Freq: Three times a day (TID) | ORAL | Status: DC | PRN
  Administered 2024-07-04: 50 mg via ORAL
  Filled 2024-07-03: qty 2

## 2024-07-03 NOTE — Plan of Care (Signed)
   Problem: Education: Goal: Knowledge of [facility] Education information/materials will improve Outcome: Progressing Goal: Emotional status will improve Outcome: Progressing Goal: Mental status will improve Outcome: Progressing Goal: Verbalization of understanding the information provided will improve Outcome: Progressing   Problem: Activity: Goal: Interest or engagement in activities will improve Outcome: Progressing

## 2024-07-03 NOTE — ED Notes (Addendum)
 Pt picked up by [facility], transport to [facility]. Report given to [place]

## 2024-07-03 NOTE — ED Notes (Signed)
 Pt took zyprexa  15mg  at this time.

## 2024-07-03 NOTE — ED Notes (Signed)
 Pt sleeping at present, no distress noted.  Monitoring for safety.

## 2024-07-03 NOTE — Progress Notes (Signed)
 Pt has been accepted to [facility] on 1/01/2025Bed assignment: 503-01  Pt meets inpatient criteria per: Starlyn Patron NP  Attending Physician will be: Dr. Raliegh MD   Report can be called to Adult unit: [phone]  Pt can arrive after RN WILL UPDATE   Care Team Notified: [facility] Bretta Qua RN,

## 2024-07-03 NOTE — ED Notes (Signed)
 This writer attempted to give pt medications, pt agitated threw medication at this writer and states I don not want your medication non compliant. Pt refuses to engage in med education.

## 2024-07-03 NOTE — ED Notes (Signed)
Pt resting quietly with eyes closed.  No pain or discomfort noted/voiced.  Breathing is even and unlabored.  Will continue to monitor for safety.  

## 2024-07-03 NOTE — ED Provider Notes (Signed)
 FBC/OBS ASAP Discharge Summary  Date and Time: 07/03/2024 10:46 AM  Name: Aaron Rubio  MRN:  [id number]   Discharge Diagnoses:  Final diagnoses:  Disorganized thought process  Schizoaffective disorder, unspecified type [facility])    Subjective: Aaron Rubio 43y male arrived on 07/02/2024 escorted by [employer]/BHRT. Per BHRT the pt was pushing shopping carts in and out of [employer]. PT is uncooperative, tangential thought and speech, refusing to answer triage questions directly. PT denies SI, HI, AVH.   Based on the above presenting symptoms, admission to Observation unit was recommended for overnight monitoring.   Patient was seen and reassessed this morning in the flex unit of the Observation unit. Patient remained frankly disorganized, labile, inappropriate and unable to provide a more coherent interval or prior to admission history.  He reported that the police had brought him because he could not get his license, papers, passport, life together but was unable to provide details about why he was acting bizarrely in the parking lot of [employer] yesterday.  Patient reported he slept well overnight.  Preoccupied with obtaining orange juice this morning.  Patient was unable to cooperate with most questions during examination.   Stay Summary: Admitted 07/02/2024 with acutely altered mental status.  Required agitation protocols twice.  Patient reassessed on 07/03/2024, placed under IVC in preparation for transfer to behavioral health Hospital for acute stabilization.  Total Time spent with patient: 30 minutes  Past Psychiatric History: Information collected from chart review:  Previous Psych Diagnoses: Major depressive disorder, schizophrenia, schizoaffective disorder, polysubstance abuse, and malingering. Prior inpatient treatment: Multiple prior psychiatric hospitalizations at [facility] in April, May, June, September, and October 2025.    Current/prior outpatient treatment: Denies  Prior rehab tx:  Denies Psychotherapy tx: Denies History of suicide: Endorses passive suicidal ideation without intent or plan; denies prior suicide attempts. History of homicide: Denies homicidal ideation or history of violent behavior.   Psychiatric medication history: Prozac , Seroquel , Zyprexa , Trazodone , Hydroxyzine , Risperidone , and Gabapentin . Patient reports good effect from zyprexa  in past, reports willingness to be on that medication. Psychiatric medication compliance history: Reports noncompliance with current and past psychiatric medications.   Neuromodulation history: None reported. Current Psychiatrist: None. Current therapist: None.   Substance Abuse Hx: Alcohol: Denies current alcohol use. Tobacco: Denies tobacco use. Illicit drugs: Cocaine use Rx drug abuse: Denies prescription drug misuse. Rehab: Denies prior rehabilitation for substance use.   Past Medical History: Medical Diagnoses: Hypertension. Home Rx: None  Head trauma, LOC, concussions, seizures: Denies  Allergies: Denies medication and food allergies. LMP: Not applicable (male). Contraception: Not applicable. PCP: None currently.   Family Psych History: Diagnoses: Denies family history of psychiatric illness. SA/HA: Denies family history of suicide attempts or completions. Substance use family hx: Reports father had a history of heroin use. Tobacco Cessation:  N/A, patient does not currently use tobacco products  Additional Social History: Marital status: Single Are you sexually active?: No What is your sexual orientation?: Heterosexual Has your sexual activity been affected by drugs, alcohol, medication, or emotional stress?: No Does patient have children?: Yes How many children?: 2 How is patient's relationship with their children?: UTA - I don't know     Childhood: Denies adverse or traumatic childhood experiences. Abuse: Denies emotional, physical, or sexual abuse. Marital Status: Single. Sexual  orientation: Heterosexual. Children: Two sons, ages 21 and 44. Employment: Works intermittently at [employer]. Claims to be employed at [employer]: Completed 12th grade. Peer Group: Limited social supports;  identifies paternal cousins as local contacts. Housing: Currently unhoused. Finances: Receives Medicaid and EBT benefits.   Legal: Incarcerated for 90 days due to assault on an officer at [facility]; currently on probation until April 17, 2024; upcoming court date on October 28 for misdemeanor larceny.   Military: Denies military history.   Current Medications:  Current Facility-Administered Medications  Medication Dose Route Frequency Provider Last Rate Last Admin   acetaminophen  (TYLENOL ) tablet 650 mg  650 mg Oral Q6H PRN Randall Starlyn HERO, NP       alum & mag hydroxide-simeth (MAALOX/MYLANTA) 200-200-20 MG/5ML suspension 30 mL  30 mL Oral Q4H PRN Randall Starlyn HERO, NP       haloperidol  (HALDOL ) tablet 5 mg  5 mg Oral TID PRN Randall Starlyn HERO, NP   5 mg at 07/02/24 2006   And   diphenhydrAMINE  (BENADRYL ) capsule 50 mg  50 mg Oral TID PRN Randall Starlyn HERO, NP   50 mg at 07/02/24 2006   haloperidol  lactate (HALDOL ) injection 5 mg  5 mg Intramuscular TID PRN Randall Starlyn HERO, NP       And   diphenhydrAMINE  (BENADRYL ) injection 50 mg  50 mg Intramuscular TID PRN Randall Starlyn HERO, NP       And   LORazepam  (ATIVAN ) injection 2 mg  2 mg Intramuscular TID PRN Randall Starlyn HERO, NP       haloperidol  lactate (HALDOL ) injection 10 mg  10 mg Intramuscular TID PRN Randall Starlyn HERO, NP       And   diphenhydrAMINE  (BENADRYL ) injection 50 mg  50 mg Intramuscular TID PRN Randall Starlyn HERO, NP       And   LORazepam  (ATIVAN ) injection 2 mg  2 mg Intramuscular TID PRN Randall Starlyn HERO, NP       magnesium  hydroxide (MILK OF MAGNESIA) suspension 30 mL  30 mL Oral Daily PRN Randall Starlyn HERO, NP       OLANZapine  zydis (ZYPREXA )  disintegrating tablet 15 mg  15 mg Oral BID Delsie Lynwood Morene Lavone, MD       No current outpatient medications on file.    PTA Medications:  Facility Ordered Medications  Medication   acetaminophen  (TYLENOL ) tablet 650 mg   alum & mag hydroxide-simeth (MAALOX/MYLANTA) 200-200-20 MG/5ML suspension 30 mL   magnesium  hydroxide (MILK OF MAGNESIA) suspension 30 mL   haloperidol  (HALDOL ) tablet 5 mg   And   diphenhydrAMINE  (BENADRYL ) capsule 50 mg   haloperidol  lactate (HALDOL ) injection 5 mg   And   diphenhydrAMINE  (BENADRYL ) injection 50 mg   And   LORazepam  (ATIVAN ) injection 2 mg   haloperidol  lactate (HALDOL ) injection 10 mg   And   diphenhydrAMINE  (BENADRYL ) injection 50 mg   And   LORazepam  (ATIVAN ) injection 2 mg   [COMPLETED] OLANZapine  zydis (ZYPREXA ) disintegrating tablet 10 mg   OLANZapine  zydis (ZYPREXA ) disintegrating tablet 15 mg       07/02/2024   10:41 AM 04/17/2024    8:17 PM 04/16/2024    9:59 AM  Depression screen PHQ 2/9  Decreased Interest -- -- 1  Down, Depressed, Hopeless -- -- 1  PHQ - 2 Score   2  Altered sleeping   1  Tired, decreased energy   0  Change in appetite   0  Feeling bad or failure about yourself    0  Trouble concentrating   1  Moving slowly or fidgety/restless   0  Suicidal thoughts   0  PHQ-9 Score  4   Difficult doing work/chores   Somewhat difficult     Data saved with a previous flowsheet row definition    Flowsheet Row ED from 07/02/2024 in [facility] ED from 07/01/2024 in [facility] Emergency Department at [facility] ED from 06/30/2024 in [facility] Emergency Department at [facility]  C-SSRS RISK CATEGORY Error: Question 6 not populated No Risk No Risk    Musculoskeletal  Strength & Muscle Tone: within normal limits Gait & Station: normal Patient leans: N/A  Psychiatric Specialty Exam  Mental Status Exam:  Appearance: African-American male of athletic  build, appears older than stated age.  Wearing hospital scrubs laying in bed  Behavior: Intense eye contact, muttering to self, inappropriate smile and laughter  Attitude: Hostile, uncooperative  Speech: Loud, rapid  Mood:  I feel terrible man  Affect: Paranoid, agitated  Thought Process: Frankly disorganized, paranoid, word salad  Thought Content:   SI/HI: Denies  Perceptions: Denies  Judgment: Lacking  Insight: Lacking  Fund of Knowledge: WNL     Nutritional Assessment (For OBS and FBC admissions only) Has the patient had a weight loss or gain of 10 pounds or more in the last 3 months?: -- (UTA)    Physical Exam  Physical Exam Vitals and nursing note reviewed.  Constitutional:      Appearance: He is normal weight.  Neurological:     Mental Status: He is alert.  Psychiatric:        Attention and Perception: He is inattentive. He perceives auditory hallucinations.        Mood and Affect: Affect is angry and inappropriate.        Speech: Speech is tangential.        Behavior: Behavior is uncooperative and agitated.        Thought Content: Thought content is paranoid and delusional. Thought content does not include homicidal or suicidal ideation.        Cognition and Memory: Cognition is impaired. Memory is impaired.        Judgment: Judgment is impulsive and inappropriate.    Review of Systems  Constitutional:  Negative for chills, fever, malaise/fatigue and weight loss.  Respiratory:  Negative for cough.   Cardiovascular:  Negative for chest pain.  Gastrointestinal:  Negative for abdominal pain, constipation, diarrhea, nausea and vomiting.  Neurological:  Negative for seizures and headaches.  Psychiatric/Behavioral:  Positive for depression and hallucinations. Negative for substance abuse and suicidal ideas. The patient is nervous/anxious and has insomnia.    Blood pressure 138/80, pulse 97, temperature 98 F (36.7 C), temperature source Oral, resp. rate 18, SpO2  100%. There is no height or weight on file to calculate BMI.  Suicide Risk Assessment:  Suicidal ideation/thoughts:   []  Current         [x]  Recent         [x]  Denies        []  Remote   [x]  Chronic  Intention to act or plan:        []  Current     [x]  Recent [x]  Denies  []  Remote  Preparatory behavior:  []  Recent    [x]  Denies Recent      []  Remote Instance  Suicide attempts:  []  Immediately prior to this admission     []  During this admission    []  Recent    [x]  Multiple   []  Remote    []  Denies Ever     Risk Factors  Protective Factors  Acute  Acute distress state, Anger/rage, Recent impulsivity or acting recklessly, Recent Discharge from inpatient psych unit (within last two weeks), and Currently psychotic AcuteSuicideProtectiveFactors: No access to highly lethal means, Denies current SI or Intent, No recent suicide attempts, and No pattern of escalating substance use  Chronic Previous suicide attempt, Previous self-harm behaviors, Major psychiatric disorder, Cluster B personality disorder/traits, Emotional lability, Poor coping or problem solving skills, Pattern of impulsivity/recklessness, History of violence, Single, Separated, or Divorced, Chronic unemployment, Chronic homelessness, Lack of social support, Lives alone, and Barriers to accessing healthcare Good physical health, Age (25-59), and Willingness to seek help   Potential future factors that may impact risk: FutureSuicideFactors : Criminal proceedings  Summary: While it is impossible to accurately predict with absolute certainty future events and human behaviors, an assessment of current suicidal indicators, risk factors, and protective factors suggests that this patient's:   Acute suicide risk is: moderate in degree.    Chronic suicide risk is: moderate in degree.   Suicide risk increases with substance/alcohol use and acute intoxication.   Plan Of Care/Follow-up recommendations:  Patient is well-known to the  hospital system.  While typically we prefer to manage his care outpatient, at this time due to the acuity of his symptoms he is recommended for acute stabilization in an inpatient setting.  He grew acutely agitated when offered medications this morning, decision was made to IVC him and administer agitation meds in preparation for his transition to the behavioral health hospital.  Currently have him on olanzapine  15 mg twice daily.  Was previously on risperidone  though noncompliant with medication.  May benefit from transition to paliperidone along with long-acting injectable.  Disposition: To [facility] BICU  Lynwood Morene Lavone Delsie, MD 07/03/2024, 10:46 AM

## 2024-07-03 NOTE — Progress Notes (Signed)
 Patient ID: Aaron Rubio, male   DOB: Dec 03, 1980, 44 y.o.   MRN: [id number] Pt arrived to facility in paper scrubs was flat in affect and mild distraction/wandering from entrance to search room and from search room to unit. Pt was only coherent half of the interview d/t going off on tangents not relevant to the questions asked and when the questions were worded differently he was getting agitated/defensive. Cooperative with skin check and signing of documents. Cut interview short d/t aggressiveness. Pt was RTIS during the interview in between questions. Had severe restlessness and irritability.

## 2024-07-03 NOTE — BHH Group Notes (Signed)
 BHH Group Notes:  (Nursing/MHT/Case Management/Adjunct)  Date:  07/03/2024  Time:  8:39 PM  Type of Therapy:  Wrap   Participation Level:  Active  Participation Quality:  Appropriate  Affect:  Appropriate  Cognitive:  Appropriate  Insight:  Appropriate  Engagement in Group:  Engaged  Modes of Intervention:  Education  Summary of Progress/Problems:Goal to feel great. Rated day 5/10.  Aaron Rubio Essex 07/03/2024, 8:39 PM

## 2024-07-03 NOTE — Discharge Instructions (Signed)
To [facility]

## 2024-07-03 NOTE — ED Notes (Signed)
 Patient resting in lounger with eyes closed, respirations even and unlabored. Patient in no apparent acute distress. Environment secured. Safety checks in place per facility protocol.

## 2024-07-04 ENCOUNTER — Encounter (HOSPITAL_COMMUNITY): Payer: Self-pay

## 2024-07-04 DIAGNOSIS — F250 Schizoaffective disorder, bipolar type: Secondary | ICD-10-CM | POA: Diagnosis not present

## 2024-07-04 LAB — LIPID PANEL
Cholesterol: 103 mg/dL (ref 0–200)
HDL: 47 mg/dL
LDL Cholesterol: 32 mg/dL (ref 0–99)
Total CHOL/HDL Ratio: 2.2 ratio
Triglycerides: 123 mg/dL
VLDL: 25 mg/dL (ref 0–40)

## 2024-07-04 NOTE — BHH Suicide Risk Assessment (Signed)
 [facility] Admission Suicide Risk Assessment   Nursing information obtained from:  Patient Demographic factors:  Male, Low socioeconomic status, Living alone, Unemployed Current Mental Status:  NA Loss Factors:  Decrease in vocational status, Financial problems / change in socioeconomic status Historical Factors:  NA Risk Reduction Factors:  NA  Total Time spent with patient: 1 hour Principal Problem: Schizoaffective disorder, bipolar type (HCC) Diagnosis:  Principal Problem:   Schizoaffective disorder, bipolar type (HCC)  Subjective Data:  Aaron Rubio is a 44 yr old male who presented on 12/31 to [facility] via [facility] for bizarre behavior and disorganized/tangential thoughts and speech, he was admitted to [facility] on 1/2.  PPHx is significant for Schizoaffective Disorder, Depression, Polysubstance Abuse (Meth, Cocaine, THC), and Medication Non-Compliance, 1 Suicide Attempt (wrapped towel around neck 2008), and Multiple Prior Psychiatric Hospitalizations (last- [facility] 06/2024), and no history Self Injurious Behavior.   During the interview patient was disorganized and mumbling.   When asked what led to his hospitalization he reports he had a problem talking to people and being unable to express himself.  He reports that he is trying to find his way.  He then reports this is exquisite and advanced.  He reports that he came to the hospital because he was trying to get his license.  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     When asked about prior psychiatric hospitalization he reports stuff is been said that he does not agree with it (per chart review- Schizoaffective Disorder, Depression, Polysubstance Abuse (Meth, Cocaine, THC), and Medication Non-Compliance).  He reports no significant past medical history.  He reports past surgical history significant for right hand surgery.  He reports no  history of head trauma.  He reports a seizure in the past after whippet use.  He reports NKDA.   He reports he is currently unhoused.  He reports he was working for the [employer].  He reports drinking alcohol occasionally.  He reports that he does vape.  He denies substance use other than whippet's (per chart review and UDS- Meth, Cocaine, THC).  He reports no access to firearms.   Discussed that he was restarted on Zyprexa  while at the [facility].  Discussed we would continue with this for now and he was agreeable with this.  Continued Clinical Symptoms:    The Alcohol Use Disorders Identification Test, Guidelines for Use in Primary Care, Second Edition.  World Science Writer [facility]). Score between 0-7:  no or low risk or alcohol related problems. Score between 8-15:  moderate risk of alcohol related problems. Score between 16-19:  high risk of alcohol related problems. Score 20 or above:  warrants further diagnostic evaluation for alcohol dependence and treatment.   CLINICAL FACTORS:   Currently Psychotic Unstable or Poor Therapeutic Relationship Previous Psychiatric Diagnoses and Treatments Schizoaffective Disorder  Musculoskeletal: Strength & Muscle Tone: within normal limits Gait & Station: normal Patient leans: N/A  Psychiatric Specialty Exam:  Presentation  General Appearance:  Bizarre; Disheveled  Eye Contact: Poor  Speech: Garbled; Slurred  Speech Volume: Normal  Handedness: Right   Mood and Affect  Mood: Labile  Affect: Labile   Thought Process  Thought Processes: Disorganized; Irrevelant  Descriptions of Associations:Tangential  Orientation:Partial  Thought Content:Delusions; Illusions  History of Schizophrenia/Schizoaffective disorder:Yes  Duration of Psychotic Symptoms:Greater than six months  Hallucinations:Hallucinations: Other (comment) (Responding to internal  stimuli)  Ideas of Reference:Percusatory; Delusions  Suicidal  Thoughts:Suicidal Thoughts: -- (Could Not Assess)  Homicidal Thoughts:Homicidal Thoughts: -- (Could Not Assess)   Sensorium  Memory: Immediate Poor  Judgment: Impaired  Insight: Poor   Executive Functions  Concentration: Poor  Attention Span: Poor  Recall: Poor  Fund of Knowledge: Poor  Language: Poor   Psychomotor Activity  Psychomotor Activity: Psychomotor Activity: Restlessness   Assets  Assets: Resilience; Physical Health   Sleep  Sleep: Sleep: -- (Unable to assess)    Physical Exam: Physical Exam Vitals and nursing note reviewed.  Constitutional:      General: He is not in acute distress.    Appearance: Normal appearance. He is normal weight. He is not ill-appearing or toxic-appearing.  HENT:     Head: Normocephalic and atraumatic.  Pulmonary:     Effort: Pulmonary effort is normal.  Musculoskeletal:        General: Normal range of motion.  Neurological:     General: No focal deficit present.     Mental Status: He is alert.    Review of Systems  Unable to perform ROS: Psychiatric disorder   Blood pressure (!) 159/103, pulse 63, temperature 98.3 F (36.8 C), temperature source Oral, resp. rate 16, SpO2 100%. There is no height or weight on file to calculate BMI.   COGNITIVE FEATURES THAT CONTRIBUTE TO RISK:  Loss of executive function    SUICIDE RISK:   Mild:  Suicidal ideation of limited frequency, intensity, duration, and specificity.  There are no identifiable plans, no associated intent, mild dysphoria and related symptoms, good self-control (both objective and subjective assessment), few other risk factors, and identifiable protective factors, including available and accessible social support.  PLAN OF CARE:  Aaron Rubio is a 44 yr old male who presented on 12/31 to [facility] via [facility] for bizarre behavior and disorganized/tangential thoughts and speech, he was admitted to [facility] on 1/2.  PPHx is significant for Schizoaffective  Disorder, Depression, Polysubstance Abuse (Meth, Cocaine, THC), and Medication Non-Compliance, 1 Suicide Attempt (wrapped towel around neck 2008), and Multiple Prior Psychiatric Hospitalizations (last- [facility] 06/2024), and no history Self Injurious Behavior.     Cayde has a known history of Schizoaffective disorder and is currently psychotic as he is having conversations with himself in his room.  He has responded to Zyprexa  in the past and has been restarted on so we will continue with it for now.       Schizoaffective Disorder, Bipolar Type: -Continue Zyprexa  15 mg BID for psychosis and mood stability -Continue Agitation Protocol: Haldol /Ativan /Benadryl      -Continue PRN's: Tylenol , Maalox, Atarax , Milk of Magnesia, Trazodone    I certify that inpatient services furnished can reasonably be expected to improve the patient's condition.   Marsa GORMAN Rosser, DO 07/04/2024, 3:43 PM

## 2024-07-04 NOTE — Group Note (Addendum)
 Recreation Therapy Group Note   Group Topic:General Recreation  Group Date: 07/04/2024 Start Time: 1040 End Time: 1105 Facilitators: Hannan Tetzlaff-McCall, LRT,CTRS Location: [address] Dayroom   Group Topic/Focus: General Recreation   Goal Area(s) Addresses:  Patient will use appropriate interactions in play with peers.   Patient will follow directions on first prompt.  Behavioral Response:   Intervention: Play and Mental Exercise  Activity: Patients were allowed free play during recreation therapy group session today. Patients had to option of playing Jenga, [place], listen to music or watch a movie.    Affect/Mood: N/A   Participation Level: Did Not Attend    Clinical Observations/Individualized Feedback: Pt started talking to himself when TV was turned off to explain group. Pt was called out of group before group started and didn't return.     Plan: Continue to engage patient in RT group sessions 2-3x/week.   Alphonza Tramell-McCall, LRT,CTRS  07/04/2024 1:36 PM

## 2024-07-04 NOTE — BHH Group Notes (Signed)
 Adult Psychoeducational Group Note  Date:  07/04/2024 Time:  10:08 AM  Group Topic/Focus:  Goals Group:   The focus of this group is to help patients establish daily goals to achieve during treatment and discuss how the patient can incorporate goal setting into their daily lives to aide in recovery. Orientation:   The focus of this group is to educate the patient on the purpose and policies of crisis stabilization and provide a format to answer questions about their admission.  The group details unit policies and expectations of patients while admitted.  Participation Level:    Participation Quality:    Affect:    Cognitive:    Insight:   Engagement in Group:    Modes of Intervention:    Additional Comments:  Pt attended the goals group.  Amanada Philbrick O 07/04/2024, 10:08 AM

## 2024-07-04 NOTE — BH IP Treatment Plan (Signed)
 Interdisciplinary Treatment and Diagnostic Plan Update  07/04/2024 Time of Session: 10:30 AM Aaron Rubio MRN: [id number]  Principal Diagnosis: Schizoaffective disorder, bipolar type (HCC)  Secondary Diagnoses: Principal Problem:   Schizoaffective disorder, bipolar type (HCC)   Current Medications:  Current Facility-Administered Medications  Medication Dose Route Frequency Provider Last Rate Last Admin   acetaminophen  (TYLENOL ) tablet 650 mg  650 mg Oral Q6H PRN Wise, James Benjamin Stallworth, MD       alum & mag hydroxide-simeth (MAALOX/MYLANTA) 200-200-20 MG/5ML suspension 30 mL  30 mL Oral Q4H PRN Delsie Lynwood Morene Lavone, MD       haloperidol  (HALDOL ) tablet 5 mg  5 mg Oral TID PRN Delsie Lynwood Morene Lavone, MD   5 mg at 07/04/24 1028   And   diphenhydrAMINE  (BENADRYL ) capsule 50 mg  50 mg Oral TID PRN Delsie Lynwood Morene Lavone, MD   50 mg at 07/04/24 1027   haloperidol  lactate (HALDOL ) injection 5 mg  5 mg Intramuscular TID PRN Delsie Lynwood Morene Lavone, MD       And   diphenhydrAMINE  (BENADRYL ) injection 50 mg  50 mg Intramuscular TID PRN Delsie Lynwood Morene Lavone, MD       And   LORazepam  (ATIVAN ) injection 2 mg  2 mg Intramuscular TID PRN Delsie Lynwood Morene Lavone, MD       haloperidol  lactate (HALDOL ) injection 10 mg  10 mg Intramuscular TID PRN Delsie Lynwood Morene Lavone, MD       And   diphenhydrAMINE  (BENADRYL ) injection 50 mg  50 mg Intramuscular TID PRN Delsie Lynwood Morene Lavone, MD       And   LORazepam  (ATIVAN ) injection 2 mg  2 mg Intramuscular TID PRN Delsie Lynwood Morene Lavone, MD       hydrOXYzine  (ATARAX ) tablet 50 mg  50 mg Oral TID PRN Delsie Lynwood Morene Lavone, MD       magnesium  hydroxide (MILK OF MAGNESIA) suspension 30 mL  30 mL Oral Daily PRN Delsie Lynwood Morene Lavone, MD       OLANZapine  zydis (ZYPREXA ) disintegrating tablet 15 mg  15 mg Oral BID Delsie Lynwood Morene Lavone, MD   15  mg at 07/04/24 1656   traZODone  (DESYREL ) tablet 50 mg  50 mg Oral QHS PRN,MR X 1 Delsie Lynwood Morene Lavone, MD       PTA Medications: No medications prior to admission.    Patient Stressors:    Patient Strengths:    Treatment Modalities: Medication Management, Group therapy, Case management,  1 to 1 session with clinician, Psychoeducation, Recreational therapy.   Physician Treatment Plan for Primary Diagnosis: Schizoaffective disorder, bipolar type (HCC) Long Term Goal(s): Improvement in symptoms so as ready for discharge   Short Term Goals: Ability to identify changes in lifestyle to reduce recurrence of condition will improve Ability to maintain clinical measurements within normal limits will improve Compliance with prescribed medications will improve Ability to identify triggers associated with substance abuse/mental health issues will improve  Medication Management: Evaluate patient's response, side effects, and tolerance of medication regimen.  Therapeutic Interventions: 1 to 1 sessions, Unit Group sessions and Medication administration.  Evaluation of Outcomes: Not Progressing  Physician Treatment Plan for Secondary Diagnosis: Principal Problem:   Schizoaffective disorder, bipolar type (HCC)  Long Term Goal(s): Improvement in symptoms so as ready for discharge   Short Term Goals: Ability to identify changes in lifestyle to reduce recurrence of condition will improve Ability to maintain clinical measurements within normal limits will improve  Compliance with prescribed medications will improve Ability to identify triggers associated with substance abuse/mental health issues will improve     Medication Management: Evaluate patient's response, side effects, and tolerance of medication regimen.  Therapeutic Interventions: 1 to 1 sessions, Unit Group sessions and Medication administration.  Evaluation of Outcomes: Not Progressing   RN Treatment Plan for Primary  Diagnosis: Schizoaffective disorder, bipolar type (HCC) Long Term Goal(s): Knowledge of disease and therapeutic regimen to maintain health will improve  Short Term Goals: Ability to remain free from injury will improve, Ability to verbalize frustration and anger appropriately will improve, Ability to demonstrate self-control, Ability to participate in decision making will improve, Ability to verbalize feelings will improve, Ability to disclose and discuss suicidal ideas, Ability to identify and develop effective coping behaviors will improve, and Compliance with prescribed medications will improve  Medication Management: RN will administer medications as ordered by provider, will assess and evaluate patient's response and provide education to patient for prescribed medication. RN will report any adverse and/or side effects to prescribing provider.  Therapeutic Interventions: 1 on 1 counseling sessions, Psychoeducation, Medication administration, Evaluate responses to treatment, Monitor vital signs and CBGs as ordered, Perform/monitor CIWA, COWS, AIMS and Fall Risk screenings as ordered, Perform wound care treatments as ordered.  Evaluation of Outcomes: Not Progressing   LCSW Treatment Plan for Primary Diagnosis: Schizoaffective disorder, bipolar type (HCC) Long Term Goal(s): Safe transition to appropriate next level of care at discharge, Engage patient in therapeutic group addressing interpersonal concerns.  Short Term Goals: Engage patient in aftercare planning with referrals and resources, Increase social support, Increase ability to appropriately verbalize feelings, Increase emotional regulation, Facilitate acceptance of mental health diagnosis and concerns, Facilitate patient progression through stages of change regarding substance use diagnoses and concerns, Identify triggers associated with mental health/substance abuse issues, and Increase skills for wellness and recovery  Therapeutic  Interventions: Assess for all discharge needs, 1 to 1 time with Social worker, Explore available resources and support systems, Assess for adequacy in community support network, Educate family and significant other(s) on suicide prevention, Complete Psychosocial Assessment, Interpersonal group therapy.  Evaluation of Outcomes: Not Progressing   Progress in Treatment: Attending groups: No. Participating in groups: No. Taking medication as prescribed: Yes. Toleration medication: Yes. Family/Significant other contact made: No, will contact:  consents are pending Patient understands diagnosis: No. Discussing patient identified problems/goals with staff: No. Medical problems stabilized or resolved: Yes. Denies suicidal/homicidal ideation: Yes. Issues/concerns per patient self-inventory: No.  New problem(s) identified:  No  New Short Term/Long Term Goal(s):    medication stabilization, elimination of SI thoughts, development of comprehensive mental wellness plan.    Patient Goals:  I want to apply for Social Security benefits.  Discharge Plan or Barriers:  Patient recently admitted. CSW will continue to follow and assess for appropriate referrals and possible discharge planning.    Reason for Continuation of Hospitalization: Hallucinations Medication stabilization  Estimated Length of Stay:  5 - 7 days  Last 3 [place] Suicide Severity Risk Score: Flowsheet Row [facility] (Current) [facility] from 07[facility]02/2024 in [facility] ED from 07/01/2024 in [facility] Department at [facility]  C-SSRS RISK CATEGORY Low Risk Error: Question 6 not populated No Risk    Last [facility] 2/9 Scores:    07/02/2024   10:41 AM 04/17/2024    8:17 PM 04/16/2024    9:59 AM  Depression screen PHQ 2/9  Decreased Interest -- --  1  Down, Depressed, Hopeless -- -- 1  PHQ - 2 Score   2  Altered sleeping   1   Tired, decreased energy   0  Change in appetite   0  Feeling bad or failure about yourself    0  Trouble concentrating   1  Moving slowly or fidgety/restless   0  Suicidal thoughts   0  PHQ-9 Score   4   Difficult doing work/chores   Somewhat difficult     Data saved with a previous flowsheet row definition    Scribe for Treatment Team: Whitman Meinhardt O Laron Angelini, LCSWA 07/04/2024 5:45 PM

## 2024-07-04 NOTE — B[facility] Group Notes (Signed)
 Adult Psychoeducational Group Note  Date:  07/04/2024 Time:  8:12 PM  Group Topic/Focus: Physical Wellness  Participation Level:  Did Not Attend  Participation Quality:    Affect:    Cognitive:    Insight:   Engagement in Group:    Modes of Intervention:    Additional Comments:    Nicolet Griffy O 07/04/2024, 8:12 PM
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
## 2024-07-04 NOTE — H&P (Signed)
 " Psychiatric Admission Assessment Adult  Patient Identification: Aaron Rubio MRN:  [id number] Date of Evaluation:  07/04/2024 Chief Complaint:  Schizoaffective disorder (HCC) [F25.9] Principal Diagnosis: Schizoaffective disorder (HCC) Diagnosis:  Principal Problem:   Schizoaffective disorder (HCC)  History of Present Illness:  Aaron Rubio is a 44 yr old male who presented on 12/31 to [facility] via [facility] for bizarre behavior and disorganized/tangential thoughts and speech, he was admitted to [facility] on 1/2.  PPHx is significant for Schizoaffective Disorder, Depression, Polysubstance Abuse (Meth, Cocaine, THC), and Medication Non-Compliance, 1 Suicide Attempt (wrapped towel around neck 2008), and Multiple Prior Psychiatric Hospitalizations (last- [facility] 06/2024), and no history Self Injurious Behavior.  During the interview patient was disorganized and mumbling.  When asked what led to his hospitalization he reports he had a problem talking to people and being unable to express himself.  He reports that he is trying to find his way.  He then reports this is exquisite and advanced.  He reports that he came to the hospital because he was trying to get his license.  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    When asked about prior psychiatric hospitalization he reports stuff is been said that he does not agree with it (per chart review- Schizoaffective Disorder, Depression, Polysubstance Abuse (Meth, Cocaine, THC), and Medication Non-Compliance).  He reports no significant past medical history.  He reports past surgical history significant for right hand surgery.  He reports no history of head trauma.  He reports a seizure in the past after whippet use.  He reports NKDA.  He reports he is currently unhoused.  He reports he was working for the [employer].  He reports drinking alcohol  occasionally.  He reports that he does vape.  He denies substance use other than whippet's (per chart review and UDS- Meth, Cocaine, THC).  He reports no access to firearms.  Discussed that he was restarted on Zyprexa  while at the [facility].  Discussed we would continue with this for now and he was agreeable with this.   Associated Signs/Symptoms: Depression Symptoms:  Unable to Assess (Hypo) Manic Symptoms:  Unable to Assess Anxiety Symptoms:  Unable to Assess Psychotic Symptoms:  Unable to Assess fully but visibly responding to internal stimuli having conversations with himself PTSD Symptoms: Unable to Assess Total Time spent with patient: 1 hour  Past Psychiatric History:  Schizoaffective Disorder, Depression, Polysubstance Abuse (Meth, Cocaine, THC), and Medication Non-Compliance, 1 Suicide Attempt (wrapped towel around neck 2008), and Multiple Prior Psychiatric Hospitalizations (last- [facility] 06/2024), and no history Self Injurious Behavior.  Is the patient at risk to self? Yes.    Has the patient been a risk to self in the past 6 months? Yes.    Has the patient been a risk to self within the distant past? Yes.    Is the patient a risk to others? No.  Has the patient been a risk to others in the past 6 months? No.  Has the patient been a risk to others within the distant past? No.   Columbia Scale:  Flowsheet Row [facility] (Current[facility] from 07/02/2024 in [facility] ED from 07/01/2024 in [facility] Emergency Department at [facility]  C-SSRS RISK CATEGORY Low Risk Error: Question 6 not populated No Risk  Prior Inpatient Therapy: Yes.   If yes, describe [facility] 06/2024  Prior Outpatient Therapy: No. If yes, describe N/A   Alcohol Screening:   Substance Abuse History in the last 12 months:  Yes.   Consequences of Substance Abuse: Medical Consequences:  Lead to  hospitalizations Previous Psychotropic Medications: Yes  Prozac , Seroquel , Zyprexa , Trazodone , Hydroxyzine , Gabapentin , Risperdal  Psychological Evaluations: No  Past Medical History:  Past Medical History:  Diagnosis Date   Hypertension    Schizophrenia (HCC)    No past surgical history on file. Family History: No family history on file. Family Psychiatric  History:  Reports No Known Diagnosis', Substance Abuse, or Suicides   Tobacco Screening: Tobacco Use History[1]  BH Tobacco Counseling     Are you interested in Tobacco Cessation Medications?  No, patient refused Counseled patient on smoking cessation:  Refused/Declined practical counseling Reason Tobacco Screening Not Completed: Patient Refused Screening       Social History:  Social History   Substance and Sexual Activity  Alcohol Use Yes   Comment: occassionally     Social History   Substance and Sexual Activity  Drug Use Yes   Types: Cocaine, Marijuana   Comment: Daily; last three years    Additional Social History: Marital status: Other (comment) (I'm dependable.) Are you sexually active?: No What is your sexual orientation?: I was baptized and I was told to Aaron Rubio something else. Has your sexual activity been affected by drugs, alcohol, medication, or emotional stress?: no Does patient have children?: No How is patient's relationship with their children?: PT REPORTS HAVING OFFSPRINGS, BUT COULD NOT ELABORATE per chart                         Allergies:  Allergies[2] Lab Results:  Results for orders placed or performed during the hospital encounter of 07/03/24 (from the past 48 hours)  Lipid panel     Status: None   Collection Time: 07/04/24  8:10 AM  Result Value Ref Range   Cholesterol 103 0 - 200 mg/dL    Comment:        ATP III CLASSIFICATION:  <200     mg/dL   Desirable  799-760  mg/dL   Borderline High  >=759    mg/dL   High           Triglycerides 123 <150 mg/dL   HDL 47 >59  mg/dL   Total CHOL/HDL Ratio 2.2 RATIO   VLDL 25 0 - 40 mg/dL   LDL Cholesterol 32 0 - 99 mg/dL    Comment:        Total Cholesterol/HDL:CHD Risk Coronary Heart Disease Risk Table                     Men   Women  1/2 Average Risk   3.4   3.3  Average Risk       5.0   4.4  2 X Average Risk   9.6   7.1  3 X Average Risk  23.4   11.0        Use the calculated Patient Ratio above and the CHD Risk Table to determine the patient's CHD Risk.        ATP III CLASSIFICATION (LDL):  <100     mg/dL   Optimal  899-870  mg/dL   Near or Above  Optimal  130-159  mg/dL   Borderline  839-810  mg/dL   High  >809     mg/dL   Very High Performed at [facility], [address], [place], [place] [address]     Blood Alcohol level:  Lab Results  Component Value Date   [facility] <15 06/28/2024   ETH <15 06/12/2024    Metabolic Disorder Labs:  Lab Results  Component Value Date   HGBA1C 5.0 10/27/2023   MPG 96.8 10/27/2023   No results found for: PROLACTIN Lab Results  Component Value Date   CHOL 103 07/04/2024   TRIG 123 07/04/2024   HDL 47 07/04/2024   CHOLHDL 2.2 07/04/2024   VLDL 25 07/04/2024   LDLCALC 32 07/04/2024   LDLCALC 66 10/19/2023    Current Medications: Current Facility-Administered Medications  Medication Dose Route Frequency Provider Last Rate Last Admin   acetaminophen  (TYLENOL ) tablet 650 mg  650 mg Oral Q6H PRN Wise, James Benjamin Stallworth, MD       alum & mag hydroxide-simeth (MAALOX/MYLANTA) 200-200-20 MG/5ML suspension 30 mL  30 mL Oral Q4H PRN Delsie Lynwood Morene Lavone, MD       haloperidol  (HALDOL ) tablet 5 mg  5 mg Oral TID PRN Delsie Lynwood Morene Lavone, MD   5 mg at 07/04/24 1028   And   diphenhydrAMINE  (BENADRYL ) capsule 50 mg  50 mg Oral TID PRN Delsie Lynwood Morene Lavone, MD   50 mg at 07/04/24 1027   haloperidol  lactate (HALDOL ) injection 5 mg  5 mg Intramuscular TID PRN Delsie Lynwood Morene Lavone, MD       And   diphenhydrAMINE  (BENADRYL ) injection 50 mg  50 mg Intramuscular TID PRN Delsie Lynwood Morene Lavone, MD       And   LORazepam  (ATIVAN ) injection 2 mg  2 mg Intramuscular TID PRN Delsie Lynwood Morene Lavone, MD       haloperidol  lactate (HALDOL ) injection 10 mg  10 mg Intramuscular TID PRN Delsie Lynwood Morene Lavone, MD       And   diphenhydrAMINE  (BENADRYL ) injection 50 mg  50 mg Intramuscular TID PRN Delsie Lynwood Morene Lavone, MD       And   LORazepam  (ATIVAN ) injection 2 mg  2 mg Intramuscular TID PRN Delsie Lynwood Morene Lavone, MD       hydrOXYzine  (ATARAX ) tablet 50 mg  50 mg Oral TID PRN Delsie Lynwood Morene Lavone, MD       magnesium  hydroxide (MILK OF MAGNESIA) suspension 30 mL  30 mL Oral Daily PRN Delsie Lynwood Morene Lavone, MD       OLANZapine  zydis (ZYPREXA ) disintegrating tablet 15 mg  15 mg Oral BID Delsie Lynwood Morene Lavone, MD   15 mg at 07/04/24 9247   traZODone  (DESYREL ) tablet 50 mg  50 mg Oral QHS PRN,MR X 1 Delsie Lynwood Morene Lavone, MD       PTA Medications: No medications prior to admission.    AIMS:  ,  ,  ,  ,  ,  ,    Musculoskeletal: Strength & Muscle Tone: within normal limits Gait & Station: normal Patient leans: N/A            Psychiatric Specialty Exam:  Presentation  General Appearance:  Bizarre; Disheveled  Eye Contact: Poor  Speech: Garbled; Slurred  Speech Volume: Normal  Handedness: Right   Mood and Affect  Mood: Labile  Affect: Labile   Thought Process  Thought Processes: Disorganized; Irrevelant  Duration  of Psychotic Symptoms:>6 months Past Diagnosis of Schizophrenia or Psychoactive disorder: Yes  Descriptions of Associations:Tangential  Orientation:Partial  Thought Content:Delusions; Illusions  Hallucinations:Hallucinations: Other (comment) (Responding to internal stimuli)  Ideas of Reference:Percusatory; Delusions  Suicidal  Thoughts:Suicidal Thoughts: -- (Could Not Assess)  Homicidal Thoughts:Homicidal Thoughts: -- (Could Not Assess)   Sensorium  Memory: Immediate Poor  Judgment: Impaired  Insight: Poor   Executive Functions  Concentration: Poor  Attention Span: Poor  Recall: Poor  Fund of Knowledge: Poor  Language: Poor   Psychomotor Activity  Psychomotor Activity: Psychomotor Activity: Restlessness   Assets  Assets: Resilience; Physical Health   Sleep  Sleep: Sleep: -- (Unable to assess)  Estimated Sleeping Duration (Last 24 Hours): 3.50-4.25 hours   Physical Exam: Physical Exam Vitals and nursing note reviewed.  Constitutional:      General: He is not in acute distress.    Appearance: Normal appearance. He is normal weight. He is not ill-appearing or toxic-appearing.  HENT:     Head: Normocephalic and atraumatic.  Pulmonary:     Effort: Pulmonary effort is normal.  Musculoskeletal:        General: Normal range of motion.  Neurological:     General: No focal deficit present.     Mental Status: He is alert.    Review of Systems  Unable to perform ROS: Psychiatric disorder   Blood pressure (!) 159/103, pulse 63, temperature 98.3 F (36.8 C), temperature source Oral, resp. rate 16, SpO2 100%. There is no height or weight on file to calculate BMI.  Treatment Plan Summary: Daily contact with patient to assess and evaluate symptoms and progress in treatment and Medication management  Aaron Rubio is a 44 yr old male who presented on 12/31 to [facility] via [facility] for bizarre behavior and disorganized/tangential thoughts and speech, he was admitted to [facility] on 1/2.  PPHx is significant for Schizoaffective Disorder, Depression, Polysubstance Abuse (Meth, Cocaine, THC), and Medication Non-Compliance, 1 Suicide Attempt (wrapped towel around neck 2008), and Multiple Prior Psychiatric Hospitalizations (last- [facility] 06/2024), and no history Self Injurious  Behavior.   Gotti has a known history of Schizoaffective disorder and is currently psychotic as he is having conversations with himself in his room.  He has responded to Zyprexa  in the past and has been restarted on so we will continue with it for now.     Schizoaffective Disorder, Bipolar Type: -Continue Zyprexa  15 mg BID for psychosis and mood stability -Continue Agitation Protocol: Haldol /Ativan /Benadryl    -Continue PRN's: Tylenol , Maalox, Atarax , Milk of Magnesia, Trazodone     Observation Level/Precautions:  15 minute checks  Laboratory:  CMP: WNL except BUN: 25, Creat: 1.33,  CBC: WNL,  UDS(12/28): [place], Benzo, Cocaine positive, Lipid Panel: Neg,  EKG: NSR w/ Sinus Arrhythmia w/ Qtc: 479 A1c ordered  Psychotherapy:    Medications:  Zyprexa   Consultations:    Discharge Concerns:    Estimated LOS: 4-6 days  Other:     Physician Treatment Plan for Primary Diagnosis: Schizoaffective disorder (HCC) Long Term Goal(s): Improvement in symptoms so as ready for discharge  Short Term Goals: Ability to identify changes in lifestyle to reduce recurrence of condition will improve, Ability to maintain clinical measurements within normal limits will improve, Compliance with prescribed medications will improve, and Ability to identify triggers associated with substance abuse/mental health issues will improve  Physician Treatment Plan for Secondary Diagnosis: Principal Problem:   Schizoaffective disorder (HCC)  Long Term Goal(s): Improvement in symptoms so as ready for discharge  Short Term Goals: Ability to identify changes in lifestyle to reduce recurrence of condition will improve, Ability to maintain clinical measurements within normal limits will improve, Compliance with prescribed medications will improve, and Ability to identify triggers associated with substance abuse/mental health issues will improve  I certify that inpatient services furnished can reasonably be expected to  improve the patient's condition.    Aaron GORMAN Rosser, Aaron Rubio 1/2/20263:41 PM     [1]  Social History Tobacco Use  Smoking Status Former   Current packs/day: 0.30   Types: Cigarettes  Smokeless Tobacco Not on file  [2] No Known Allergies  "

## 2024-07-04 NOTE — [facility] Group Notes (Signed)
 [facility] Group Notes:  (Nursing/MHT/Case Management/Adjunct)  Date:  07/04/2024  Time:  9:04 PM  Type of Therapy:  Wrap up group  Participation Level:  Did Not Attend  Participation Quality:    Affect:    Cognitive:    Insight:    Engagement in Group:    Modes of Intervention:    Summary of Progress/Problems:  Grayce LITTIE Essex 07/04/2024, 9:04 PM

## 2024-07-04 NOTE — Progress Notes (Signed)
(  Sleep Hours) - 4 (Any PRNs that were needed, meds refused, or side effects to meds)- none (Any disturbances and when (visitation, over night)- none reported (Concerns raised by the patient)-  (SI/HI/AVH)- Denies all  Pt confused and has to be redirected multiple times to be redirect.

## 2024-07-04 NOTE — [facility] Group Notes (Signed)
 Adult Psychoeducational Group Note  Date:  07/04/2024 Time:  1:33 PM  Group Topic/Focus: Recreation Therapy  Participation Level:  Did Not Attend  Participation Quality:    Affect:    Cognitive:    Insight:   Engagement in Group:    Modes of Intervention:    Additional Comments:    Kaylum Shrum O 07/04/2024, 1:33 PM

## 2024-07-05 DIAGNOSIS — F250 Schizoaffective disorder, bipolar type: Secondary | ICD-10-CM | POA: Diagnosis not present

## 2024-07-05 LAB — HEMOGLOBIN A1C
Hgb A1c MFr Bld: 5.3 % (ref 4.8–5.6)
Mean Plasma Glucose: 105.41 mg/dL

## 2024-07-05 NOTE — Plan of Care (Signed)
   Problem: Education: Goal: Emotional status will improve Outcome: Not Progressing Goal: Mental status will improve Outcome: Not Progressing

## 2024-07-05 NOTE — Group Note (Signed)
 Date:  07/05/2024 Time:  10:56 AM  Group Topic/Focus:  Intellectual Wellness: Focused on cognitive stimulation and creative expression through listening to music and coloring. Members demonstrated sustained attention to task, followed directions, and made independent choices regarding colors and materials. Music was used to support focus and processing, and participants were able to remain on task for the duration of the group with minimal redirection.    Participation Level:  Active  Participation Quality:  Appropriate and Attentive  Affect:  Appropriate  Cognitive:  Alert and Appropriate  Insight: Appropriate and Good  Engagement in Group:  Engaged  Modes of Intervention:  Activity  Additional Comments:  N/A  Lauris JONELLE Morales 07/05/2024, 10:56 AM

## 2024-07-05 NOTE — Progress Notes (Signed)
 [facility] MD Progress Note  07/05/2024 3:02 PM Aaron Rubio  MRN:  [id number] Subjective:  Aaron Rubio is a 44 yr old male who presented on 12/31 to [facility] via [facility] for bizarre behavior and disorganized/tangential thoughts and speech, he was admitted to [facility] on 1/2.  PPHx is significant for Schizoaffective Disorder, Depression, Polysubstance Abuse (Meth, Cocaine, THC), and Medication Non-Compliance, 1 Suicide Attempt (wrapped towel around neck 2008), and Multiple Prior Psychiatric Hospitalizations (last- [facility] 06/2024), and no history Self Injurious Behavior.    Case was discussed in the multidisciplinary team. MAR was reviewed and patient was compliant with medications.  He received PRN Agitation Haldol  and Benadryl  yesterday.  He received PRN Hydroxyzine  yesterday.   Psychiatric Team made the following recommendations yesterday: -Continue Zyprexa  15 mg BID for psychosis and mood stability    On interview today patient reports he slept fair last night.  He reports his appetite is doing good.  He reports no SI, HI, or AVH.  He reports no Paranoia or Ideas of Reference.  He reports no issues with his medications.  When asked how he was feeling he reports I should I feel?  When asked about having any physical symptoms he reports eating some clothes or a book bag.  He reports that last night he needed something to drink but could not get anything.  He reports no other concerns at present.  Principal Problem: Schizoaffective disorder, bipolar type (HCC) Diagnosis: Principal Problem:   Schizoaffective disorder, bipolar type (HCC)  Total Time spent with patient:  I personally spent 35 minutes on the unit in direct patient care. The direct patient care time included face-to-face time with the patient, reviewing the patient's chart, communicating with other professionals, and coordinating care.    Past Psychiatric History:  Schizoaffective Disorder, Depression, Polysubstance Abuse (Meth,  Cocaine, THC), and Medication Non-Compliance, 1 Suicide Attempt (wrapped towel around neck 2008), and Multiple Prior Psychiatric Hospitalizations (last- [facility] 06/2024), and no history Self Injurious Behavior.   Past Medical History:  Past Medical History:  Diagnosis Date   Hypertension    Schizophrenia (HCC)    No past surgical history on file. Family History: No family history on file. Family Psychiatric  History:  Reports No Known Diagnosis', Substance Abuse, or Suicides   Social History:  Social History   Substance and Sexual Activity  Alcohol Use Yes   Comment: occassionally     Social History   Substance and Sexual Activity  Drug Use Yes   Types: Cocaine, Marijuana   Comment: Daily; last three years    Social History   Socioeconomic History   Marital status: Married    Spouse name: Not on file   Number of children: Not on file   Years of education: Not on file   Highest education level: Not on file  Occupational History   Not on file  Tobacco Use   Smoking status: Former    Current packs/day: 0.30    Types: Cigarettes   Smokeless tobacco: Not on file  Vaping Use   Vaping status: Every Day   Substances: Nicotine , THC  Substance and Sexual Activity   Alcohol use: Yes    Comment: occassionally   Drug use: Yes    Types: Cocaine, Marijuana    Comment: Daily; last three years   Sexual activity: Yes    Comment: Reports married with kids living in a house  Other Topics Concern   Not on file  Social History Narrative  Not on file   Social Drivers of Health   Tobacco Use: Medium Risk (06/30/2024)   Patient History    Smoking Tobacco Use: Former    Smokeless Tobacco Use: Unknown    Passive Exposure: Not on file  Financial Resource Strain: Not on file  Food Insecurity: Patient Unable To Answer (07/02/2024)   Epic    Worried About Programme Researcher, Broadcasting/film/video in the Last Year: Patient unable to answer    Ran Out of Food in the Last Year: Patient unable to  answer  Recent Concern: Food Insecurity - Food Insecurity Present (04/17/2024)   Epic    Worried About Programme Researcher, Broadcasting/film/video in the Last Year: Sometimes true    [employer] of Food in the Last Year: Sometimes true  Transportation Needs: Patient Unable To Answer (07/02/2024)   Epic    Lack of Transportation (Medical): Patient unable to answer    Lack of Transportation (Non-Medical): Patient unable to answer  Recent Concern: Transportation Needs - Unmet Transportation Needs (04/17/2024)   Epic    Lack of Transportation (Medical): Yes    Lack of Transportation (Non-Medical): Yes  Physical Activity: Not on file  Stress: Not on file  Social Connections: Not on file  Depression (PHQ2-9): Low Risk (04/16/2024)   Depression (PHQ2-9)    PHQ-2 Score: 4  Alcohol Screen: Low Risk (04/07/2024)   Alcohol Screen    Last Alcohol Screening Score (AUDIT): 0  Housing: High Risk (04/07/2024)   Epic    Unable to Pay for Housing in the Last Year: Yes    Number of Times Moved in the Last Year: 2    Homeless in the Last Year: Yes  Utilities: Patient Unable To Answer (07/02/2024)   Epic    Threatened with loss of utilities: Patient unable to answer  Health Literacy: Not on file   Additional Social History:                         Sleep: Good Estimated Sleeping Duration (Last 24 Hours): 4.75-5.50 hours  Appetite:  Good  Current Medications: Current Facility-Administered Medications  Medication Dose Route Frequency Provider Last Rate Last Admin   acetaminophen  (TYLENOL ) tablet 650 mg  650 mg Oral Q6H PRN Delsie Lynwood Morene Lavone, MD       alum & mag hydroxide-simeth (MAALOX/MYLANTA) 200-200-20 MG/5ML suspension 30 mL  30 mL Oral Q4H PRN Delsie Lynwood Morene Lavone, MD       haloperidol  (HALDOL ) tablet 5 mg  5 mg Oral TID PRN Delsie Lynwood Morene Lavone, MD   5 mg at 07/04/24 1028   And   diphenhydrAMINE  (BENADRYL ) capsule 50 mg  50 mg Oral TID PRN Delsie Lynwood Morene Lavone, MD   50 mg at 07/04/24 1027   haloperidol  lactate (HALDOL ) injection 5 mg  5 mg Intramuscular TID PRN Delsie Lynwood Morene Lavone, MD       And   diphenhydrAMINE  (BENADRYL ) injection 50 mg  50 mg Intramuscular TID PRN Delsie Lynwood Morene Lavone, MD       And   LORazepam  (ATIVAN ) injection 2 mg  2 mg Intramuscular TID PRN Delsie Lynwood Morene Lavone, MD       haloperidol  lactate (HALDOL ) injection 10 mg  10 mg Intramuscular TID PRN Delsie Lynwood Morene Lavone, MD       And   diphenhydrAMINE  (BENADRYL ) injection 50 mg  50 mg Intramuscular TID PRN Delsie Lynwood Morene Lavone, MD  And   LORazepam  (ATIVAN ) injection 2 mg  2 mg Intramuscular TID PRN Delsie Lynwood Morene Lavone, MD       hydrOXYzine  (ATARAX ) tablet 50 mg  50 mg Oral TID PRN Delsie Lynwood Morene Lavone, MD   50 mg at 07/05/24 9286   magnesium  hydroxide (MILK OF MAGNESIA) suspension 30 mL  30 mL Oral Daily PRN Delsie Lynwood Morene Lavone, MD       OLANZapine  zydis (ZYPREXA ) disintegrating tablet 15 mg  15 mg Oral BID Delsie Lynwood Morene Lavone, MD   15 mg at 07/05/24 9286   traZODone  (DESYREL ) tablet 50 mg  50 mg Oral QHS PRN,MR X 1 Delsie Lynwood Morene Lavone, MD        Lab Results:  Results for orders placed or performed during the hospital encounter of 07/03/24 (from the past 48 hours)  Lipid panel     Status: None   Collection Time: 07/04/24  8:10 AM  Result Value Ref Range   Cholesterol 103 0 - 200 mg/dL    Comment:        ATP III CLASSIFICATION:  <200     mg/dL   Desirable  799-760  mg/dL   Borderline High  >=759    mg/dL   High           Triglycerides 123 <150 mg/dL   HDL 47 >59 mg/dL   Total CHOL/HDL Ratio 2.2 RATIO   VLDL 25 0 - 40 mg/dL   LDL Cholesterol 32 0 - 99 mg/dL    Comment:        Total Cholesterol/HDL:CHD Risk Coronary Heart Disease Risk Table                     Men   Women  1/2 Average Risk   3.4   3.3  Average Risk       5.0   4.4  2 X  Average Risk   9.6   7.1  3 X Average Risk  23.4   11.0        Use the calculated Patient Ratio above and the CHD Risk Table to determine the patient's CHD Risk.        ATP III CLASSIFICATION (LDL):  <100     mg/dL   Optimal  899-870  mg/dL   Near or Above                    Optimal  130-159  mg/dL   Borderline  839-810  mg/dL   High  >809     mg/dL   Very High Performed at [facility], [address] [place], [place] [address]   Hemoglobin A1c     Status: None   Collection Time: 07/05/24  6:31 AM  Result Value Ref Range   Hgb A1c MFr Bld 5.3 4.8 - 5.6 %    Comment: (NOTE) Diagnosis of Diabetes The following HbA1c ranges recommended by the American Diabetes Association (ADA) may be used as an aid in the diagnosis of diabetes mellitus.  Hemoglobin             Suggested A1C NGSP%              Diagnosis  <5.7                   Non Diabetic  5.7-6.4                Pre-Diabetic  >6.4  Diabetic  <7.0                   Glycemic control for                       adults with diabetes.     Mean Plasma Glucose 105.41 mg/dL    Comment: Performed at [facility], [address], [place], [place] [address]    Blood Alcohol level:  Lab Results  Component Value Date   [facility] <15 06/28/2024   ETH <15 06/12/2024    Metabolic Disorder Labs: Lab Results  Component Value Date   HGBA1C 5.3 07/05/2024   MPG 105.41 07/05/2024   MPG 96.8 10/27/2023   No results found for: PROLACTIN Lab Results  Component Value Date   CHOL 103 07/04/2024   TRIG 123 07/04/2024   HDL 47 07/04/2024   CHOLHDL 2.2 07/04/2024   VLDL 25 07/04/2024   LDLCALC 32 07/04/2024   LDLCALC 66 10/19/2023    Physical Findings: AIMS:  ,  ,  ,  ,  ,  ,   CIWA:    COWS:     Musculoskeletal: Strength & Muscle Tone: within normal limits Gait & Station: normal Patient leans: N/A  Psychiatric Specialty Exam:  Presentation  General Appearance:  Bizarre  Eye  Contact: Poor  Speech: Garbled; Slurred  Speech Volume: Normal  Handedness: Right   Mood and Affect  Mood: -- (how should I feel)  Affect: Labile   Thought Process  Thought Processes: Disorganized; Irrevelant  Descriptions of Associations:Tangential  Orientation:Partial  Thought Content:Delusions; Illogical  History of Schizophrenia/Schizoaffective disorder:Yes  Duration of Psychotic Symptoms:Greater than six months  Hallucinations:Hallucinations: -- (responding to internal stimuli)  Ideas of Reference:Percusatory  Suicidal Thoughts:Suicidal Thoughts: No  Homicidal Thoughts:Homicidal Thoughts: No   Sensorium  Memory: Immediate Poor  Judgment: Impaired  Insight: Poor   Executive Functions  Concentration: Poor  Attention Span: Poor  Recall: Poor  Fund of Knowledge: Poor  Language: Poor   Psychomotor Activity  Psychomotor Activity: Psychomotor Activity: Restlessness   Assets  Assets: Resilience; Physical Health   Sleep  Sleep: Sleep: Fair    Physical Exam: Physical Exam Vitals and nursing note reviewed.  Constitutional:      General: He is not in acute distress.    Appearance: Normal appearance. He is normal weight. He is not ill-appearing or toxic-appearing.  HENT:     Head: Normocephalic and atraumatic.  Pulmonary:     Effort: Pulmonary effort is normal.  Musculoskeletal:        General: Normal range of motion.  Neurological:     General: No focal deficit present.     Mental Status: He is alert.    Review of Systems  Unable to perform ROS: Psychiatric disorder   Blood pressure (!) 154/80, pulse 85, temperature 98.3 F (36.8 C), temperature source Oral, resp. rate 16, SpO2 100%. There is no height or weight on file to calculate BMI.   Treatment Plan Summary: Daily contact with patient to assess and evaluate symptoms and progress in treatment and Medication management  Aaron Rubio is a 44 yr old male  who presented on 12/31 to [facility] via [facility] for bizarre behavior and disorganized/tangential thoughts and speech, he was admitted to [facility] on 1/2.  PPHx is significant for Schizoaffective Disorder, Depression, Polysubstance Abuse (Meth, Cocaine, THC), and Medication Non-Compliance, 1 Suicide Attempt (wrapped towel around neck 2008), and Multiple Prior Psychiatric Hospitalizations (last- [facility] 06/2024), and no history Self Injurious Behavior.     Aaron Rubio continues to be delusional and disorganized but is not as labile as he was at [facility].  If he continues to show minimal improvement we will need to consider starting a second agent tomorrow.  We will not make any changes to his medications at this time.  We will continue to monitor.     Schizoaffective Disorder, Bipolar Type: -Continue Zyprexa  15 mg BID for psychosis and mood stability -Continue Agitation Protocol: Haldol /Ativan /Benadryl      -Continue PRN's: Tylenol , Maalox, Atarax , Milk of Magnesia, Trazodone    --  The risks/benefits/side-effects/alternatives to medications were discussed in detail with the patient and time was given for questions. The patient consents to medication trials.                -- Metabolic profile and EKG monitoring obtained while on an atypical antipsychotic (BMI: 28.28, Lipid Panel: WNL, HbgA1c: 5.3, EKG: NSR w/ Sinus Arrhythmia w/QTc: 479)              -- Encouraged patient to participate in unit milieu and in scheduled group therapies              -- Short Term Goals: Ability to identify changes in lifestyle to reduce recurrence of condition will improve, Ability to verbalize feelings will improve, Ability to disclose and discuss suicidal ideas, Ability to demonstrate self-control will improve, Ability to identify and develop effective coping behaviors will improve, Ability to maintain clinical measurements within normal limits will improve, Compliance with prescribed medications will improve, and Ability to identify  triggers associated with substance abuse/mental health issues will improve             -- Long Term Goals: Improvement in symptoms so as ready for discharge   Safety and Monitoring:             -- Involuntary admission to inpatient psychiatric unit for safety, stabilization and treatment             -- Daily contact with patient to assess and evaluate symptoms and progress in treatment             -- Patient's case to be discussed in multi-disciplinary team meeting             -- Observation Level : q15 minute checks             -- Vital signs:  q12 hours             -- Precautions: suicide, elopement, and assault  Discharge Planning:              -- Social work and case management to assist with discharge planning and identification of hospital follow-up needs prior to discharge             -- Estimated LOS: 5-7 more days             -- Discharge Concerns: Need to establish a safety plan; Medication compliance and effectiveness             -- Discharge Goals: Return home with outpatient referrals for mental health follow-up including medication management/psychotherapy  Aaron GORMAN Rosser, DO 07/05/2024, 3:02 PM

## 2024-07-05 NOTE — Group Note (Signed)
 Date:  07/05/2024 Time:  11:05 AM  Group Topic/Focus: Social Work      Pt did attend social work group  Kalila Adkison R Seham Gardenhire 07/05/2024, 11:05 AM

## 2024-07-05 NOTE — Progress Notes (Signed)
(  Sleep Hours) -5 (Any PRNs that were needed, meds refused, or side effects to meds)- Atarx (Any disturbances and when (visitation, over night)-none (Concerns raised by the patient)-  (SI/HI/AVH)-denied AVH and SIHI,but was observed by writer responding to internal stimuli.

## 2024-07-05 NOTE — Group Note (Signed)
 Date:  07/05/2024 Time:  8:44 PM  Group Topic/Focus:  Wrap-Up Group:   The focus of this group is to help patients review their daily goal of treatment and discuss progress on daily workbooks.    Participation Level:  Did Not Attend   Aaron Rubio 07/05/2024, 8:44 PM

## 2024-07-05 NOTE — Progress Notes (Signed)
" °   07/05/24 1450  Psych Admission Type (Psych Patients Only)  Admission Status Involuntary  Psychosocial Assessment  Patient Complaints Anxiety  Eye Contact Fair  Facial Expression Flat  Affect Preoccupied  Speech Tangential  Interaction Assertive  Motor Activity Other (Comment) (WDL)  Appearance/Hygiene Unremarkable  Behavior Characteristics Cooperative  Mood Preoccupied;Anxious  Thought Process  Coherency Disorganized  Content Preoccupation  Delusions Paranoid  Perception Hallucinations  Hallucination Auditory  Judgment Impaired  Confusion Mild  Danger to Self  Current suicidal ideation? Denies  Self-Injurious Behavior No self-injurious ideation or behavior indicators observed or expressed   Danger to Others  Danger to Others None reported or observed    "

## 2024-07-05 NOTE — Group Note (Signed)
 Date:  07/05/2024 Time:  9:51 AM  Group Topic/Focus:  Goals Group:   The focus of this group is to help patients establish daily goals to achieve during treatment and discuss how the patient can incorporate goal setting into their daily lives to aide in recovery.    Participation Level:  Did Not Attend   [name] [name] 07/05/2024, 9:51 AM

## 2024-07-05 NOTE — Group Note (Signed)
"                                                 [facility] LCSW Group Therapy Note    Group Date: 07/05/2024 Start Time: 1015 End Time: 1045  Type of Therapy and Topic:  Group Therapy:  Overcoming Obstacles  Participation Level:  [facility] PARTICIPATION LEVEL: Active   Description of Group:   In this group patients will be encouraged to explore what they see as obstacles to their own wellness and recovery. They will be guided to discuss their thoughts, feelings, and behaviors related to these obstacles. The group will process together ways to cope with barriers, with attention given to specific choices patients can make. Each patient will be challenged to identify changes they are motivated to make in order to overcome their obstacles. This group will be process-oriented, with patients participating in exploration of their own experiences as well as giving and receiving support and challenge from other group members.  Therapeutic Goals: 1. Patient will identify personal and current obstacles as they relate to admission. 2. Patient will identify barriers that currently interfere with their wellness or overcoming obstacles.  3. Patient will identify feelings, thought process and behaviors related to these barriers. 4. Patient will identify two changes they are willing to make to overcome these obstacles:    Summary of Patient Progress Patient reported the [facility] is an obstacle in his life. Pt ask CSW to call Harlene ph: [phone] when asked who to call for help.    Therapeutic Modalities:   Cognitive Behavioral Therapy Solution Focused Therapy Motivational Interviewing Relapse Prevention Therapy   Camelia Olden, LCSW "

## 2024-07-05 NOTE — Plan of Care (Signed)
   Problem: Health Behavior/Discharge Planning: Goal: Compliance with treatment plan for underlying cause of condition will improve Outcome: Progressing   Problem: Safety: Goal: Periods of time without injury will increase Outcome: Progressing

## 2024-07-05 NOTE — Group Note (Signed)
 Date:  07/05/2024 Time:  3:06 PM  Group Topic/Focus:  Emotional Wellness:   The focus of this group is to discuss what feelings/emotions are, and how they are experienced.    Participation Level:  Active  Participation Quality:  Appropriate and Attentive  Affect:  Appropriate  Cognitive:  Appropriate  Insight: Appropriate  Engagement in Group:  Engaged  Modes of Intervention:  Discussion  Additional Comments:  n/a  Mourad Cwikla R Lailoni Baquera 07/05/2024, 3:06 PM

## 2024-07-05 NOTE — Group Note (Signed)
 Date:  07/05/2024 Time:  12:47 PM  Group Topic/Focus: Physical Wellness Explore and reflect on the importance of physical activity and wellness, as discussed in the TED Talk. Aim to understand how regular movement can positively impact both physical and mental health, and identify small, achievable steps that can be incorporated into daily routines to enhance overall well-being.    Participation Level:  Did Not Attend   Aaron Rubio R Latamara Melder 07/05/2024, 12:47 PM

## 2024-07-06 DIAGNOSIS — F250 Schizoaffective disorder, bipolar type: Secondary | ICD-10-CM | POA: Diagnosis not present

## 2024-07-06 MED ORDER — AMLODIPINE BESYLATE 5 MG PO TABS
5.0000 mg | ORAL_TABLET | Freq: Every day | ORAL | Status: DC
  Administered 2024-07-06 – 2024-07-09 (×4): 5 mg via ORAL
  Filled 2024-07-06 (×4): qty 1

## 2024-07-06 MED ORDER — HALOPERIDOL 5 MG PO TABS
5.0000 mg | ORAL_TABLET | Freq: Two times a day (BID) | ORAL | Status: DC
  Administered 2024-07-06 – 2024-07-08 (×4): 5 mg via ORAL
  Filled 2024-07-06 (×4): qty 1

## 2024-07-06 NOTE — [facility] Group Notes (Signed)
 Adult Psychoeducational Group Note  Date:  07/06/2024 Time:  2:46 PM  Group Topic/Focus: Intellectual Wellness  Participation Level:  Did Not Attend  Participation Quality:    Affect:    Cognitive:    Insight:   Engagement in Group:    Modes of Intervention:    Additional Comments:    Ziza Hastings O 07/06/2024, 2:46 PM

## 2024-07-06 NOTE — Group Note (Signed)
 Date:  07/06/2024 Time:  4:41 PM  Group Topic/Focus:  Building Self Esteem:   The Focus of this group is helping patients become aware of the effects of self-esteem on their lives, the things they and others do that enhance or undermine their self-esteem, seeing the relationship between their level of self-esteem and the choices they make and learning ways to enhance self-esteem. Wellness Toolbox:   The focus of this group is to discuss various aspects of wellness, balancing those aspects and exploring ways to increase the ability to experience wellness.  Patients will create a wellness toolbox for use upon discharge.    Participation Level:  Did Not Attend   Aaron Rubio Lowers 07/06/2024, 4:41 PM

## 2024-07-06 NOTE — Plan of Care (Signed)
   Problem: Education: Goal: Knowledge of [facility] Education information/materials will improve Outcome: Progressing Goal: Emotional status will improve Outcome: Progressing Goal: Mental status will improve Outcome: Progressing Goal: Verbalization of understanding the information provided will improve Outcome: Progressing   Problem: Activity: Goal: Interest or engagement in activities will improve Outcome: Progressing Goal: Sleeping patterns will improve Outcome: Progressing   Problem: Coping: Goal: Ability to verbalize frustrations and anger appropriately will improve Outcome: Progressing

## 2024-07-06 NOTE — [facility] Group Notes (Signed)
 Adult Psychoeducational Group Note  Date:  07/06/2024 Time:  9:59 AM  Group Topic/Focus:  Goals Group:   The focus of this group is to help patients establish daily goals to achieve during treatment and discuss how the patient can incorporate goal setting into their daily lives to aide in recovery. Orientation:   The focus of this group is to educate the patient on the purpose and policies of crisis stabilization and provide a format to answer questions about their admission.  The group details unit policies and expectations of patients while admitted.  Participation Level:  Minimal  Participation Quality:  Attentive  Affect:  Flat  Cognitive:  Disorganized  Insight: Lacking  Engagement in Group:  Engaged  Modes of Intervention:  Discussion  Additional Comments:  Pt attended the goals group  Bita Cartwright O 07/06/2024, 9:59 AM

## 2024-07-06 NOTE — Plan of Care (Signed)
   Problem: Education: Goal: Emotional status will improve Outcome: Not Progressing Goal: Mental status will improve Outcome: Not Progressing

## 2024-07-06 NOTE — [facility] Group Notes (Signed)
 Adult Psychoeducational Group Note  Date:  07/06/2024 Time:  2:46 PM  Group Topic/Focus: Emotional Wellness  Participation Level:  Did Not Attend  Participation Quality:    Affect:    Cognitive:    Insight:   Engagement in Group:    Modes of Intervention:    Additional Comments:    Aaron Rubio O 07/06/2024, 2:46 PM

## 2024-07-06 NOTE — Progress Notes (Signed)
(  Sleep Hours) -4.75 (Any PRNs that were needed, meds refused, or side effects to meds)- Trazodone   (Any disturbances and when (visitation, over night)- pt became upset when writer told her he could not have an ensure. Pt stated he has htn and anemia and needs the ensure.  (Concerns raised by the patient)-  (SI/HI/AVH)-denied SIHI and endorses AH

## 2024-07-06 NOTE — Progress Notes (Signed)
" °   07/06/24 1100  Psych Admission Type (Psych Patients Only)  Admission Status Involuntary  Psychosocial Assessment  Patient Complaints Irritability  Eye Contact Fair  Facial Expression Animated  Affect Preoccupied;Irritable  Speech Tangential  Interaction Superficial  Motor Activity Other (Comment)  Appearance/Hygiene Unremarkable  Behavior Characteristics Cooperative  Mood Preoccupied  Thought Process  Coherency Disorganized  Content Preoccupation  Delusions Paranoid  Perception Hallucinations  Hallucination Auditory  Judgment Poor  Confusion Mild  Danger to Self  Current suicidal ideation? Denies    "

## 2024-07-06 NOTE — Group Note (Signed)
 Date:  07/06/2024 Time:  8:46 PM  Group Topic/Focus:  Wrap-Up Group:   The focus of this group is to help patients review their daily goal of treatment and discuss progress on daily workbooks.    Participation Level:  Active  Participation Quality:  Appropriate  Affect:  Appropriate  Cognitive:  Appropriate  Insight: Appropriate  Engagement in Group:  Engaged  Modes of Intervention:  Education and Exploration  Additional Comments:  Patient attended and participated in group tonight. He reports that he feel good good about himself.  Gwenn Chillington Dacosta 07/06/2024, 8:46 PM

## 2024-07-06 NOTE — Progress Notes (Signed)
 [facility] MD Progress Note  07/06/2024 2:29 PM Aaron Rubio  MRN:  [id number] Subjective:  Aaron Rubio is a 44 yr old male who presented on 12/31 to [facility] via [facility] for bizarre behavior and disorganized/tangential thoughts and speech, he was admitted to [facility] on 1/2.  PPHx is significant for Schizoaffective Disorder, Depression, Polysubstance Abuse (Meth, Cocaine, THC), and Medication Non-Compliance, 1 Suicide Attempt (wrapped towel around neck 2008), and Multiple Prior Psychiatric Hospitalizations (last- [facility] 06/2024), and no history Self Injurious Behavior.    Case was discussed in the multidisciplinary team. MAR was reviewed and patient was compliant with medications.  He received PRN Hydroxyzine  and Trazodone  yesterday.   Psychiatric Team made the following recommendations yesterday: -Continue Zyprexa  15 mg BID for psychosis and mood stability    On interview today patient reports he slept good last night.  He reports his appetite is doing good.  He reports no SI, HI, or AVH.  He reports no Paranoia or Ideas of Reference.  He reports no issues with his medications.  He reports that he needs Ensure because it was one of his medications last time.  He reports someone must be looking through his files and changing things.  He then reports he does not want to talk anymore because he will start cursing.  He reports no other concerns at present.   Principal Problem: Schizoaffective disorder, bipolar type (HCC) Diagnosis: Principal Problem:   Schizoaffective disorder, bipolar type (HCC)  Total Time spent with patient:  I personally spent 35 minutes on the unit in direct patient care. The direct patient care time included face-to-face time with the patient, reviewing the patient's chart, communicating with other professionals, and coordinating care.    Past Psychiatric History:  Schizoaffective Disorder, Depression, Polysubstance Abuse (Meth, Cocaine, THC), and Medication  Non-Compliance, 1 Suicide Attempt (wrapped towel around neck 2008), and Multiple Prior Psychiatric Hospitalizations (last- [facility] 06/2024), and no history Self Injurious Behavior.   Past Medical History:  Past Medical History:  Diagnosis Date   Hypertension    Schizophrenia (HCC)    No past surgical history on file. Family History: No family history on file. Family Psychiatric  History:  Reports No Known Diagnosis', Substance Abuse, or Suicides   Social History:  Social History   Substance and Sexual Activity  Alcohol Use Yes   Comment: occassionally     Social History   Substance and Sexual Activity  Drug Use Yes   Types: Cocaine, Marijuana   Comment: Daily; last three years    Social History   Socioeconomic History   Marital status: Married    Spouse name: Not on file   Number of children: Not on file   Years of education: Not on file   Highest education level: Not on file  Occupational History   Not on file  Tobacco Use   Smoking status: Former    Current packs/day: 0.30    Types: Cigarettes   Smokeless tobacco: Not on file  Vaping Use   Vaping status: Every Day   Substances: Nicotine , THC  Substance and Sexual Activity   Alcohol use: Yes    Comment: occassionally   Drug use: Yes    Types: Cocaine, Marijuana    Comment: Daily; last three years   Sexual activity: Yes    Comment: Reports married with kids living in a house  Other Topics Concern   Not on file  Social History Narrative   Not on file   Social Drivers  of Health   Tobacco Use: Medium Risk (06/30/2024)   Patient History    Smoking Tobacco Use: Former    Smokeless Tobacco Use: Unknown    Passive Exposure: Not on file  Financial Resource Strain: Not on file  Food Insecurity: Patient Unable To Answer (07/05/2024)   Epic    Worried About Programme Researcher, Broadcasting/film/video in the Last Year: Patient unable to answer    Ran Out of Food in the Last Year: Patient unable to answer  Recent Concern: Food  Insecurity - Food Insecurity Present (04/17/2024)   Epic    Worried About Programme Researcher, Broadcasting/film/video in the Last Year: Sometimes true    Ran Out of Food in the Last Year: Sometimes true  Transportation Needs: Patient Declined (07/05/2024)   Epic    Lack of Transportation (Medical): Patient declined    Lack of Transportation (Non-Medical): Patient declined  Recent Concern: Transportation Needs - Unmet Transportation Needs (04/17/2024)   Epic    Lack of Transportation (Medical): Yes    Lack of Transportation (Non-Medical): Yes  Physical Activity: Not on file  Stress: Not on file  Social Connections: Not on file  Depression (PHQ2-9): Low Risk (04/16/2024)   Depression (PHQ2-9)    PHQ-2 Score: 4  Alcohol Screen: Low Risk (04/07/2024)   Alcohol Screen    Last Alcohol Screening Score (AUDIT): 0  Housing: Patient Declined (07/05/2024)   Epic    Unable to Pay for Housing in the Last Year: Patient declined    Number of Times Moved in the Last Year: Not on file    Homeless in the Last Year: Patient declined  Recent Concern: Housing - High Risk (04/07/2024)   Epic    Unable to Pay for Housing in the Last Year: Yes    Number of Times Moved in the Last Year: 2    Homeless in the Last Year: Yes  Utilities: Patient Declined (07/05/2024)   Epic    Threatened with loss of utilities: Patient declined  Health Literacy: Not on file   Additional Social History:                         Sleep: Good Estimated Sleeping Duration (Last 24 Hours): 2.75-3.25 hours  Appetite:  Good  Current Medications: Current Facility-Administered Medications  Medication Dose Route Frequency Provider Last Rate Last Admin   acetaminophen  (TYLENOL ) tablet 650 mg  650 mg Oral Q6H PRN Delsie Lynwood Morene Lavone, MD       alum & mag hydroxide-simeth (MAALOX/MYLANTA) 200-200-20 MG/5ML suspension 30 mL  30 mL Oral Q4H PRN Delsie Lynwood Morene Lavone, MD       amLODipine  (NORVASC ) tablet 5 mg  5 mg Oral Daily  Rik Wadel S, DO       haloperidol  (HALDOL ) tablet 5 mg  5 mg Oral TID PRN Delsie Lynwood Morene Lavone, MD   5 mg at 07/04/24 1028   And   diphenhydrAMINE  (BENADRYL ) capsule 50 mg  50 mg Oral TID PRN Delsie Lynwood Morene Lavone, MD   50 mg at 07/04/24 1027   haloperidol  lactate (HALDOL ) injection 5 mg  5 mg Intramuscular TID PRN Delsie Lynwood Morene Lavone, MD       And   diphenhydrAMINE  (BENADRYL ) injection 50 mg  50 mg Intramuscular TID PRN Delsie Lynwood Morene Lavone, MD       And   LORazepam  (ATIVAN ) injection 2 mg  2 mg Intramuscular TID PRN Delsie Lynwood Morene Lavone,  MD       haloperidol  lactate (HALDOL ) injection 10 mg  10 mg Intramuscular TID PRN Delsie Lynwood Morene Lavone, MD       And   diphenhydrAMINE  (BENADRYL ) injection 50 mg  50 mg Intramuscular TID PRN Delsie Lynwood Morene Lavone, MD       And   LORazepam  (ATIVAN ) injection 2 mg  2 mg Intramuscular TID PRN Delsie Lynwood Morene Lavone, MD       haloperidol  (HALDOL ) tablet 5 mg  5 mg Oral BID Donyetta Ogletree S, DO       hydrOXYzine  (ATARAX ) tablet 50 mg  50 mg Oral TID PRN Delsie Lynwood Morene Lavone, MD   50 mg at 07/05/24 9286   magnesium  hydroxide (MILK OF MAGNESIA) suspension 30 mL  30 mL Oral Daily PRN Delsie Lynwood Morene Lavone, MD       OLANZapine  zydis (ZYPREXA ) disintegrating tablet 15 mg  15 mg Oral BID Delsie Lynwood Morene Lavone, MD   15 mg at 07/06/24 0830   traZODone  (DESYREL ) tablet 50 mg  50 mg Oral QHS PRN,MR X 1 Delsie Lynwood Morene Lavone, MD   50 mg at 07/05/24 2037    Lab Results:  Results for orders placed or performed during the hospital encounter of 07/03/24 (from the past 48 hours)  Hemoglobin A1c     Status: None   Collection Time: 07/05/24  6:31 AM  Result Value Ref Range   Hgb A1c MFr Bld 5.3 4.8 - 5.6 %    Comment: (NOTE) Diagnosis of Diabetes The following HbA1c ranges recommended by the American Diabetes Association (ADA) may be  used as an aid in the diagnosis of diabetes mellitus.  Hemoglobin             Suggested A1C NGSP%              Diagnosis  <5.7                   Non Diabetic  5.7-6.4                Pre-Diabetic  >6.4                   Diabetic  <7.0                   Glycemic control for                       adults with diabetes.     Mean Plasma Glucose 105.41 mg/dL    Comment: Performed at [facility], [address] [place], [place] [address]    Blood Alcohol level:  Lab Results  Component Value Date   [facility] <15 06/28/2024   ETH <15 06/12/2024    Metabolic Disorder Labs: Lab Results  Component Value Date   HGBA1C 5.3 07/05/2024   MPG 105.41 07/05/2024   MPG 96.8 10/27/2023   No results found for: PROLACTIN Lab Results  Component Value Date   CHOL 103 07/04/2024   TRIG 123 07/04/2024   HDL 47 07/04/2024   CHOLHDL 2.2 07/04/2024   VLDL 25 07/04/2024   LDLCALC 32 07/04/2024   LDLCALC 66 10/19/2023    Physical Findings: AIMS:  ,  ,  ,  ,  ,  ,   CIWA:    COWS:     Musculoskeletal: Strength & Muscle Tone: within normal limits Gait & Station: normal Patient leans: N/A  Psychiatric Specialty  Exam:  Presentation  General Appearance:  Casual  Eye Contact: Poor  Speech: Slurred; Garbled  Speech Volume: Normal  Handedness: Right   Mood and Affect  Mood: -- (fine)  Affect: Labile   Thought Process  Thought Processes: Disorganized; Irrevelant  Descriptions of Associations:Tangential  Orientation:Partial  Thought Content:Delusions; Illogical  History of Schizophrenia/Schizoaffective disorder:Yes  Duration of Psychotic Symptoms:Greater than six months  Hallucinations:Hallucinations: -- (responding to internal stimuli)  Ideas of Reference:Paranoia; Percusatory  Suicidal Thoughts:Suicidal Thoughts: No  Homicidal Thoughts:Homicidal Thoughts: No   Sensorium  Memory: Immediate  Poor  Judgment: Impaired  Insight: Lacking   Executive Functions  Concentration: Poor  Attention Span: Poor  Recall: Poor  Fund of Knowledge: Poor  Language: Poor   Psychomotor Activity  Psychomotor Activity: Psychomotor Activity: Restlessness   Assets  Assets: Resilience; Physical Health   Sleep  Sleep: Sleep: Good    Physical Exam: Physical Exam Vitals and nursing note reviewed.  Constitutional:      General: He is not in acute distress.    Appearance: Normal appearance. He is normal weight. He is not ill-appearing or toxic-appearing.  HENT:     Head: Normocephalic and atraumatic.  Pulmonary:     Effort: Pulmonary effort is normal.  Musculoskeletal:        General: Normal range of motion.  Neurological:     General: No focal deficit present.     Mental Status: He is alert.    Review of Systems  Unable to perform ROS: Psychiatric disorder   Blood pressure (!) 153/109, pulse 64, temperature 97.7 F (36.5 C), temperature source Oral, resp. rate 16, SpO2 99%. There is no height or weight on file to calculate BMI.   Treatment Plan Summary: Daily contact with patient to assess and evaluate symptoms and progress in treatment and Medication management  Aaron Rubio is a 44 yr old male who presented on 12/31 to [facility] via [facility] for bizarre behavior and disorganized/tangential thoughts and speech, he was admitted to [facility] on 1/2.  PPHx is significant for Schizoaffective Disorder, Depression, Polysubstance Abuse (Meth, Cocaine, THC), and Medication Non-Compliance, 1 Suicide Attempt (wrapped towel around neck 2008), and Multiple Prior Psychiatric Hospitalizations (last- [facility] 06/2024), and no history Self Injurious Behavior.     Aaron Rubio continues to be delusional/disorganized and irritable/labile.  During his last admission he required dual agents so we will start Haldol .  His blood pressure continues to be elevated so we will restart Amlodipine  which he  was on during his last admission.  We will continue to monitor.      Schizoaffective Disorder, Bipolar Type: -Start Haldol  5 mg BID for psychosis and mood stability  -Continue Zyprexa  15 mg BID for psychosis and mood stability -Continue Agitation Protocol: Haldol /Ativan /Benadryl      HTN: -Start Amlodipine  5 mg daily   -Continue PRN's: Tylenol , Maalox, Atarax , Milk of Magnesia, Trazodone    --  The risks/benefits/side-effects/alternatives to medications were discussed in detail with the patient and time was given for questions. The patient consents to medication trials.                -- Metabolic profile and EKG monitoring obtained while on an atypical antipsychotic (BMI: 28.28, Lipid Panel: WNL, HbgA1c: 5.3, EKG: NSR w/ Sinus Arrhythmia w/QTc: 479)              -- Encouraged patient to participate in unit milieu and in scheduled group therapies              --  Short Term Goals: Ability to identify changes in lifestyle to reduce recurrence of condition will improve, Ability to verbalize feelings will improve, Ability to disclose and discuss suicidal ideas, Ability to demonstrate self-control will improve, Ability to identify and develop effective coping behaviors will improve, Ability to maintain clinical measurements within normal limits will improve, Compliance with prescribed medications will improve, and Ability to identify triggers associated with substance abuse/mental health issues will improve             -- Long Term Goals: Improvement in symptoms so as ready for discharge   Safety and Monitoring:             -- Involuntary admission to inpatient psychiatric unit for safety, stabilization and treatment             -- Daily contact with patient to assess and evaluate symptoms and progress in treatment             -- Patient's case to be discussed in multi-disciplinary team meeting             -- Observation Level : q15 minute checks             -- Vital signs:  q12 hours              -- Precautions: suicide, elopement, and assault  Discharge Planning:              -- Social work and case management to assist with discharge planning and identification of hospital follow-up needs prior to discharge             -- Estimated LOS: 5-7 more days             -- Discharge Concerns: Need to establish a safety plan; Medication compliance and effectiveness             -- Discharge Goals: Return home with outpatient referrals for mental health follow-up including medication management/psychotherapy  Marsa GORMAN Rosser, DO 07/06/2024, 2:29 PM

## 2024-07-06 NOTE — Plan of Care (Signed)
   Problem: Education: Goal: Emotional status will improve Outcome: Progressing Goal: Mental status will improve Outcome: Progressing Goal: Verbalization of understanding the information provided will improve Outcome: Progressing   Problem: Activity: Goal: Interest or engagement in activities will improve Outcome: Progressing

## 2024-07-07 DIAGNOSIS — F250 Schizoaffective disorder, bipolar type: Secondary | ICD-10-CM | POA: Diagnosis not present

## 2024-07-07 NOTE — Progress Notes (Signed)
 Collateral contact - Harlene (Peer Support) from [place] [facility], [phone]  Peer Support explained that they help patients with basic needs, such as obtaining food stamps, Medicaid and housing.  Peer Support said that patient has Medicaid and they completed a housing assessment.  She is also helping him with obtaining temporary housing for winder [employer]).    Peer Support will visit patient on Tuesday, 07/08/2024 at 12:45 PM.  She said patient is on the go and sometimes is difficult to locate.   Catilyn Boggus, LCSWA 07/08/2023

## 2024-07-07 NOTE — Progress Notes (Signed)
(  Sleep Hours) - 2.75 (Any PRNs that were needed, meds refused, or side effects to meds)-  no  (Any disturbances and when (visitation, over night)-none reported (Concerns raised by the patient)- none reported (SI/HI/AVH)- denies all  Isolative in room

## 2024-07-07 NOTE — Progress Notes (Signed)
 Conversation with patient:  CSW offered inpatient substance use inpatient program treatments, but patient stated that his drug tests have not been dirty.   Nakiyah Beverley, LCSWA 07/08/2023

## 2024-07-07 NOTE — Progress Notes (Signed)
 [facility] MD Progress Note  07/07/2024 1:40 PM Aaron Rubio  MRN:  [id number] Subjective:  Aaron Rubio is a 44 yr old male who presented on 12/31 to [facility] via [facility] for bizarre behavior and disorganized/tangential thoughts and speech, he was admitted to [facility] on 1/2.  PPHx is significant for Schizoaffective Disorder, Depression, Polysubstance Abuse (Meth, Cocaine, THC), and Medication Non-Compliance, 1 Suicide Attempt (wrapped towel around neck 2008), and Multiple Prior Psychiatric Hospitalizations (last- [facility] 06/2024), and no history Self Injurious Behavior.    Case was discussed in the multidisciplinary team. MAR was reviewed and patient was compliant with medications.  He received no PRN medications yesterday.   Psychiatric Team made the following recommendations yesterday: -Start Haldol  5 mg BID for psychosis and mood stability  -Continue Zyprexa  15 mg BID for psychosis and mood stability    On interview today patient reports he slept good last night.  He reports his appetite is doing good.  When asked about SI he reports he does have SI because this interview is stopping him for going to the bathroom  He reports no HI or AVH (appears to be responding to internal stimuli).  He reports no Paranoia or Ideas of Reference.  He reports no issues with his medications.  He reports that he needs sweat pants and boots and wants to know if these can be given to him.  He then states he needs to use the bathroom and ended the interview.  He reports no other concerns at present.   Principal Problem: Schizoaffective disorder, bipolar type (HCC) Diagnosis: Principal Problem:   Schizoaffective disorder, bipolar type (HCC)  Total Time spent with patient:  I personally spent 35 minutes on the unit in direct patient care. The direct patient care time included face-to-face time with the patient, reviewing the patient's chart, communicating with other professionals, and coordinating care.    Past  Psychiatric History:  Schizoaffective Disorder, Depression, Polysubstance Abuse (Meth, Cocaine, THC), and Medication Non-Compliance, 1 Suicide Attempt (wrapped towel around neck 2008), and Multiple Prior Psychiatric Hospitalizations (last- [facility] 06/2024), and no history Self Injurious Behavior.   Past Medical History:  Past Medical History:  Diagnosis Date   Hypertension    Schizophrenia (HCC)    No past surgical history on file. Family History: No family history on file. Family Psychiatric  History:  Reports No Known Diagnosis', Substance Abuse, or Suicides   Social History:  Social History   Substance and Sexual Activity  Alcohol Use Yes   Comment: occassionally     Social History   Substance and Sexual Activity  Drug Use Yes   Types: Cocaine, Marijuana   Comment: Daily; last three years    Social History   Socioeconomic History   Marital status: Married    Spouse name: Not on file   Number of children: Not on file   Years of education: Not on file   Highest education level: Not on file  Occupational History   Not on file  Tobacco Use   Smoking status: Former    Current packs/day: 0.30    Types: Cigarettes   Smokeless tobacco: Not on file  Vaping Use   Vaping status: Every Day   Substances: Nicotine , THC  Substance and Sexual Activity   Alcohol use: Yes    Comment: occassionally   Drug use: Yes    Types: Cocaine, Marijuana    Comment: Daily; last three years   Sexual activity: Yes    Comment: Reports married  with kids living in a house  Other Topics Concern   Not on file  Social History Narrative   Not on file   Social Drivers of Health   Tobacco Use: Medium Risk (06/30/2024)   Patient History    Smoking Tobacco Use: Former    Smokeless Tobacco Use: Unknown    Passive Exposure: Not on file  Financial Resource Strain: Not on file  Food Insecurity: Patient Unable To Answer (07/05/2024)   Epic    Worried About Programme Researcher, Broadcasting/film/video in the Last  Year: Patient unable to answer    Ran Out of Food in the Last Year: Patient unable to answer  Recent Concern: Food Insecurity - Food Insecurity Present (04/17/2024)   Epic    Worried About Programme Researcher, Broadcasting/film/video in the Last Year: Sometimes true    Ran Out of Food in the Last Year: Sometimes true  Transportation Needs: Patient Declined (07/05/2024)   Epic    Lack of Transportation (Medical): Patient declined    Lack of Transportation (Non-Medical): Patient declined  Recent Concern: Transportation Needs - Unmet Transportation Needs (04/17/2024)   Epic    Lack of Transportation (Medical): Yes    Lack of Transportation (Non-Medical): Yes  Physical Activity: Not on file  Stress: Not on file  Social Connections: Not on file  Depression (PHQ2-9): Low Risk (04/16/2024)   Depression (PHQ2-9)    PHQ-2 Score: 4  Alcohol Screen: Low Risk (04/07/2024)   Alcohol Screen    Last Alcohol Screening Score (AUDIT): 0  Housing: Patient Declined (07/05/2024)   Epic    Unable to Pay for Housing in the Last Year: Patient declined    Number of Times Moved in the Last Year: Not on file    Homeless in the Last Year: Patient declined  Recent Concern: Housing - High Risk (04/07/2024)   Epic    Unable to Pay for Housing in the Last Year: Yes    Number of Times Moved in the Last Year: 2    Homeless in the Last Year: Yes  Utilities: Patient Declined (07/05/2024)   Epic    Threatened with loss of utilities: Patient declined  Health Literacy: Not on file   Additional Social History:                         Sleep: Good Estimated Sleeping Duration (Last 24 Hours): 1.75-2.75 hours  Appetite:  Good  Current Medications: Current Facility-Administered Medications  Medication Dose Route Frequency Provider Last Rate Last Admin   acetaminophen  (TYLENOL ) tablet 650 mg  650 mg Oral Q6H PRN Delsie Lynwood Morene Lavone, MD       alum & mag hydroxide-simeth (MAALOX/MYLANTA) 200-200-20 MG/5ML suspension 30 mL   30 mL Oral Q4H PRN Delsie Lynwood Morene Lavone, MD       amLODipine  (NORVASC ) tablet 5 mg  5 mg Oral Daily Roni Scow S, DO   5 mg at 07/07/24 9176   haloperidol  (HALDOL ) tablet 5 mg  5 mg Oral TID PRN Delsie Lynwood Morene Lavone, MD   5 mg at 07/04/24 1028   And   diphenhydrAMINE  (BENADRYL ) capsule 50 mg  50 mg Oral TID PRN Delsie Lynwood Morene Lavone, MD   50 mg at 07/04/24 1027   haloperidol  lactate (HALDOL ) injection 5 mg  5 mg Intramuscular TID PRN Delsie Lynwood Morene Lavone, MD       And   diphenhydrAMINE  (BENADRYL ) injection 50 mg  50 mg Intramuscular  TID PRN Delsie Lynwood Morene Lavone, MD       And   LORazepam  (ATIVAN ) injection 2 mg  2 mg Intramuscular TID PRN Delsie Lynwood Morene Lavone, MD       haloperidol  lactate (HALDOL ) injection 10 mg  10 mg Intramuscular TID PRN Delsie Lynwood Morene Lavone, MD       And   diphenhydrAMINE  (BENADRYL ) injection 50 mg  50 mg Intramuscular TID PRN Delsie Lynwood Morene Lavone, MD       And   LORazepam  (ATIVAN ) injection 2 mg  2 mg Intramuscular TID PRN Delsie Lynwood Morene Lavone, MD       haloperidol  (HALDOL ) tablet 5 mg  5 mg Oral BID Elois Averitt S, DO   5 mg at 07/07/24 9177   hydrOXYzine  (ATARAX ) tablet 50 mg  50 mg Oral TID PRN Delsie Lynwood Morene Lavone, MD   50 mg at 07/05/24 9286   magnesium  hydroxide (MILK OF MAGNESIA) suspension 30 mL  30 mL Oral Daily PRN Delsie Lynwood Morene Lavone, MD       OLANZapine  zydis (ZYPREXA ) disintegrating tablet 15 mg  15 mg Oral BID Delsie Lynwood Morene Lavone, MD   15 mg at 07/07/24 9176   traZODone  (DESYREL ) tablet 50 mg  50 mg Oral QHS PRN,MR X 1 Delsie Lynwood Morene Lavone, MD   50 mg at 07/05/24 2037    Lab Results:  No results found for this or any previous visit (from the past 48 hours).   Blood Alcohol level:  Lab Results  Component Value Date   [facility] <15 06/28/2024   ETH <15 06/12/2024    Metabolic Disorder Labs: Lab  Results  Component Value Date   HGBA1C 5.3 07/05/2024   MPG 105.41 07/05/2024   MPG 96.8 10/27/2023   No results found for: PROLACTIN Lab Results  Component Value Date   CHOL 103 07/04/2024   TRIG 123 07/04/2024   HDL 47 07/04/2024   CHOLHDL 2.2 07/04/2024   VLDL 25 07/04/2024   LDLCALC 32 07/04/2024   LDLCALC 66 10/19/2023    Physical Findings: AIMS:  ,  ,  ,  ,  ,  ,   CIWA:    COWS:     Musculoskeletal: Strength & Muscle Tone: within normal limits Gait & Station: normal Patient leans: N/A  Psychiatric Specialty Exam:  Presentation  General Appearance:  Casual  Eye Contact: Poor  Speech: Garbled  Speech Volume: Normal  Handedness: Right   Mood and Affect  Mood: -- renard)  Affect: Labile   Thought Process  Thought Processes: Disorganized  Descriptions of Associations:Tangential  Orientation:Partial  Thought Content:Illogical  History of Schizophrenia/Schizoaffective disorder:Yes  Duration of Psychotic Symptoms:Greater than six months  Hallucinations:Hallucinations: -- (appears to be responding to internal stimuli)  Ideas of Reference:Paranoia  Suicidal Thoughts:Suicidal Thoughts: -- (reports SI because not able to use the bathroom)  Homicidal Thoughts:Homicidal Thoughts: No   Sensorium  Memory: Immediate Poor  Judgment: Impaired  Insight: Lacking   Executive Functions  Concentration: Poor  Attention Span: Poor  Recall: Poor  Fund of Knowledge: Poor  Language: Poor   Psychomotor Activity  Psychomotor Activity: Psychomotor Activity: Restlessness   Assets  Assets: Physical Health; Resilience   Sleep  Sleep: Sleep: Good    Physical Exam: Physical Exam Vitals and nursing note reviewed.  Constitutional:      General: He is not in acute distress.    Appearance: Normal appearance. He is normal weight. He is not ill-appearing or toxic-appearing.  HENT:     Head: Normocephalic and atraumatic.   Pulmonary:     Effort: Pulmonary effort is normal.  Musculoskeletal:        General: Normal range of motion.  Neurological:     General: No focal deficit present.     Mental Status: He is alert.    Review of Systems  Unable to perform ROS: Psychiatric disorder   Blood pressure (!) 143/102, pulse 91, temperature 98.3 F (36.8 C), temperature source Oral, resp. rate 16, SpO2 100%. There is no height or weight on file to calculate BMI.   Treatment Plan Summary: Daily contact with patient to assess and evaluate symptoms and progress in treatment and Medication management  Aaron Rubio is a 44 yr old male who presented on 12/31 to [facility] via GPD for bizarre behavior and disorganized/tangential thoughts and speech, he was admitted to [facility] on 1/2.  PPHx is significant for Schizoaffective Disorder, Depression, Polysubstance Abuse (Meth, Cocaine, THC), and Medication Non-Compliance, 1 Suicide Attempt (wrapped towel around neck 2008), and Multiple Prior Psychiatric Hospitalizations (last- [facility] 06/2024), and no history Self Injurious Behavior.     Aaron Rubio continues to be delusional and disorganized but does seem to be less labile with the start of Haldol .  Most likely will need further titration of his Haldol  tomorrow.  His blood pressure continues to be elevated and so may need further titration tomorrow.  We will continue to monitor.      Schizoaffective Disorder, Bipolar Type: -Continue Haldol  5 mg BID for psychosis and mood stability  -Continue Zyprexa  15 mg BID for psychosis and mood stability -Continue Agitation Protocol: Haldol /Ativan /Benadryl      HTN: -Continue Amlodipine  5 mg daily   -Continue PRN's: Tylenol , Maalox, Atarax , Milk of Magnesia, Trazodone    --  The risks/benefits/side-effects/alternatives to medications were discussed in detail with the patient and time was given for questions. The patient consents to medication trials.                -- Metabolic profile  and EKG monitoring obtained while on an atypical antipsychotic (BMI: 28.28, Lipid Panel: WNL, HbgA1c: 5.3, EKG: NSR w/ Sinus Arrhythmia w/QTc: 479)              -- Encouraged patient to participate in unit milieu and in scheduled group therapies              -- Short Term Goals: Ability to identify changes in lifestyle to reduce recurrence of condition will improve, Ability to verbalize feelings will improve, Ability to disclose and discuss suicidal ideas, Ability to demonstrate self-control will improve, Ability to identify and develop effective coping behaviors will improve, Ability to maintain clinical measurements within normal limits will improve, Compliance with prescribed medications will improve, and Ability to identify triggers associated with substance abuse/mental health issues will improve             -- Long Term Goals: Improvement in symptoms so as ready for discharge   Safety and Monitoring:             -- Involuntary admission to inpatient psychiatric unit for safety, stabilization and treatment             -- Daily contact with patient to assess and evaluate symptoms and progress in treatment             -- Patient's case to be discussed in multi-disciplinary team meeting             --  Observation Level : q15 minute checks             -- Vital signs:  q12 hours             -- Precautions: suicide, elopement, and assault  Discharge Planning:              -- Social work and case management to assist with discharge planning and identification of hospital follow-up needs prior to discharge             -- Estimated LOS: 5-7 more days             -- Discharge Concerns: Need to establish a safety plan; Medication compliance and effectiveness             -- Discharge Goals: Return home with outpatient referrals for mental health follow-up including medication management/psychotherapy  Marsa GORMAN Rosser, DO 07/07/2024, 1:40 PM

## 2024-07-07 NOTE — Plan of Care (Signed)
   Problem: Education: Goal: Emotional status will improve Outcome: Not Progressing Goal: Mental status will improve Outcome: Not Progressing
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
## 2024-07-07 NOTE — Group Note (Signed)
 Recreation Therapy Group Note   Group Topic:Coping Skills  Group Date: 07/07/2024 Start Time: 1025 End Time: 1045 Facilitators: Talonda Artist-McCall, LRT,CTRS Location: [address] Dayroom   Group Topic: Coping Skills   Goal Area(s) Addresses: Patient will define what a coping skill is. Patient will create a list of healthy coping skills beginning with each letter of the alphabet. Patient will successfully identify positive coping skills they can use post d/c.  Patient will acknowledge benefit(s) of using learned coping skills post d/c.  Behavioral Response:    Intervention: Worksheet   Activity: Coping A to Z. Patient asked to identify what a coping skill is and when they use them. Patients with clinical research associate discussed healthy versus unhealthy coping skills. Next patients were given a blank worksheet titled Coping Skills A-Z. Patients were instructed to come up with at least one positive coping skill per letter of the alphabet. Patients were given 15 minutes to brainstorm before ideas were presented to the large group. Patients and LRT debriefed on the importance of coping skill selection based on situation and back-up plans when a skill tried is not effective. At the end of group, patients were given an handout of alphabetized strategies to keep for future reference.   Education: Pharmacologist, Scientist, Physiological, Discharge Planning.    Education Outcome: Acknowledges education/Verbalizes understanding/In group clarification offered/Additional education needed   Affect/Mood: N/A   Participation Level: Did not attend    Clinical Observations/Individualized Feedback:      Plan: Continue to engage patient in RT group sessions 2-3x/week.   Modell Fendrick-McCall, LRT,CTRS 07/07/2024 1:49 PM

## 2024-07-07 NOTE — Group Note (Signed)
 LCSW Group Therapy Note   Group Date: 07/07/2024 Start Time: 1300 End Time: 1400   Participation:  patient was present and actively participated in the discussion.  Type of Therapy:  Group Therapy  Topic:  Speaking from the Heart: Communicating with Understanding and Empathy  Objective:  To help participants develop effective communication skills to express themselves clearly, listen actively, and navigate conflicts in a healthy way.  Goals: Increase awareness of verbal and non-verbal communication skills. Practice using I statements and active listening techniques. Learn coping strategies for managing communication stress.  Summary:  Participants explored the importance of communication, discussed challenges, and practiced skills such as active listening and assertive expression. They reflected on past experiences and identified ways to improve communication in their daily lives.  Therapeutic Modalities: Cognitive-Behavioral Therapy (CBT): Restructuring negative thought patterns in communication. Mindfulness: Staying present and calm during conversations. Psychoeducation: Learning about effective communication techniques.   Tyshell Ramberg O Paizleigh Wilds, LCSWA 07/07/2024  2:17 PM

## 2024-07-07 NOTE — Group Note (Signed)
 Date:  07/07/2024 Time:  9:03 PM  Group Topic/Focus:  Wrap-Up Group:   The focus of this group is to help patients review their daily goal of treatment and discuss progress on daily workbooks.    Participation Level:  Active  Participation Quality:  Appropriate  Affect:  Appropriate  Cognitive:  Appropriate  Insight: Appropriate  Engagement in Group:  Engaged  Modes of Intervention:  Education  Additional Comments:  Patient attended and participated in group tonight. He reports that his goal today was to stop trying to outsmart people but just to listen and follow directions.  Gwenn Chillington Dacosta 07/07/2024, 9:03 PM

## 2024-07-07 NOTE — Progress Notes (Signed)
(  Sleep Hours) -7.5 as of 0530 (Any PRNs that were needed, meds refused, or side effects to meds)- none (Any disturbances and when (visitation, over night)-none (Concerns raised by the patient)- none (SI/HI/AVH)- denies all

## 2024-07-08 MED ORDER — HALOPERIDOL 5 MG PO TABS
10.0000 mg | ORAL_TABLET | Freq: Every day | ORAL | Status: DC
  Administered 2024-07-08 – 2024-07-13 (×5): 10 mg via ORAL
  Filled 2024-07-08 (×5): qty 2

## 2024-07-08 MED ORDER — HALOPERIDOL 5 MG PO TABS
5.0000 mg | ORAL_TABLET | Freq: Every day | ORAL | Status: DC
  Administered 2024-07-09 – 2024-07-14 (×6): 5 mg via ORAL
  Filled 2024-07-08 (×6): qty 1

## 2024-07-08 NOTE — BHH Group Notes (Signed)
 Adult Psychoeducational Group Note  Date:  07/08/2024 Time:  9:18 AM  Group Topic/Focus: Goals Group Goals Group:   The focus of this group is to help patients establish daily goals to achieve during treatment and discuss how the patient can incorporate goal setting into their daily lives to aide in recovery.  Participation Level:  Did Not Attend    Aaron Rubio 07/08/2024, 9:18 AM

## 2024-07-08 NOTE — Progress Notes (Addendum)
 Collateral contact - Harlene (Peer Support) from [employer], [phone]   Peer Support visited patient in person, and they met in the conference room.  Peer Support completed the application for a temporary housing during winter months.  She said it will take a couple of days to process the application.  She hopes that the home will be available at discharge.  Harlene said that she will pick up patient on Monday, 07/14/2024 at 10 AM.    Collateral contact - [facility] (Disability Advocacy) [address] [place], [place] [address], [phone]   CSW emailed the referral to [email or web address], DENISE [email or web address]>, [email or web address], [email or web address]>, [email or web address] via secure email.   Yancy Hascall, LCSWA 07/08/2024

## 2024-07-08 NOTE — Progress Notes (Signed)
(  Sleep Hours) -2 (Any PRNs that were needed, meds refused, or side effects to meds)- Trazodone   (Any disturbances and when (visitation, over night)-none (Concerns raised by the patient)- none  (SI/HI/AVH) observed attending to unseen others

## 2024-07-08 NOTE — B[facility] Group Notes (Signed)
 Adult Psychoeducational Group Note  Date:  07/08/2024 Time:  1:16 PM  Group Topic/Focus:pharmacy Group    Participation Level:  Did Not Attend    Naida Rome Ruth 07/08/2024, 1:16 PM

## 2024-07-08 NOTE — Plan of Care (Signed)
   Problem: Education: Goal: Emotional status will improve Outcome: Progressing Goal: Mental status will improve Outcome: Progressing

## 2024-07-08 NOTE — Plan of Care (Signed)
  Problem: Coping: Goal: Ability to demonstrate self-control will improve Outcome: Progressing   Problem: Health Behavior/Discharge Planning: Goal: Identification of resources available to assist in meeting health care needs will improve Outcome: Not Progressing

## 2024-07-08 NOTE — Progress Notes (Signed)
 Adult Psychoeducational Group Note  Date:  07/08/2024 Time:  8:36 PM  Group Topic/Focus:  Wrap-Up Group:   The focus of this group is to help patients review their daily goal of treatment and discuss progress on daily workbooks.  Participation Level:  Active  Participation Quality:  Appropriate  Affect:  Appropriate  Cognitive:  Appropriate  Insight: Appropriate  Engagement in Group:  Engaged  Modes of Intervention:  Discussion  Additional Comments:  Pt goal for the day was to keep his head high. Pt met goal.  Daine Pillar D 07/08/2024, 8:36 PM

## 2024-07-08 NOTE — Group Note (Signed)
 Recreation Therapy Group Note   Group Topic:Animal Assisted Therapy   Group Date: 07/08/2024 Start Time: 0950 End Time: 1030 Facilitators: Gailyn Crook-McCall, LRT,CTRS Location: [address] Dayroom   Animal-Assisted Activity (AAA) Program Checklist/Progress Notes Patient Eligibility Criteria Checklist & Daily Group note for Rec Tx Intervention  AAA/T Program Assumption of Risk Form signed by Patient/ or Parent Legal Guardian Yes  Patient understands his/her participation is voluntary Yes  Behavioral Response:    Education: Charity Fundraiser, Appropriate Animal Interaction   Education Outcome: Acknowledges education.    Affect/Mood: N/A   Participation Level: Did not attend    Clinical Observations/Individualized Feedback:     Plan: Continue to engage patient in RT group sessions 2-3x/week.   Shalawn Wynder-McCall, LRT,CTRS 07/08/2024 1:17 PM

## 2024-07-08 NOTE — Group Note (Signed)
 Date:  07/08/2024 Time:  3:50 PM  Group Topic/Focus:  Developing a Wellness Toolbox:   The focus of this group is to help patients develop a wellness toolbox with skills and strategies to promote recovery upon discharge.  Identifying Support Systems  Participation Level:  Did Not Attend  [name] 07/08/2024, 3:50 PM

## 2024-07-08 NOTE — BHH Group Notes (Signed)
 Adult Psychoeducational Group Note  Date:  07/08/2024 Time:  10:53 AM  Group Topic/Focus: Mental Health Video   Participation Level:  Did Not Attend    Aaron Rubio Ruth 07/08/2024, 10:53 AM

## 2024-07-08 NOTE — B[facility] Group Notes (Signed)
 Adult Psychoeducational Group Note  Date:  07/08/2024 Time:  9:59 AM  Group Topic/Focus:Pet Group    Participation Level:  Did Not Attend   Aaron Rubio 07/08/2024, 9:59 AM

## 2024-07-08 NOTE — Progress Notes (Signed)
 [facility] MD Progress Note  07/08/2024 8:19 AM Aaron Rubio  MRN:  [id number]   Principal Problem: Schizoaffective disorder, bipolar type (HCC) Diagnosis: Principal Problem:   Schizoaffective disorder, bipolar type (HCC)  Total Time spent with patient:  I personally spent 35 minutes on the unit in direct patient care. The direct patient care time included face-to-face time with the patient, reviewing the patient's chart, communicating with other professionals, and coordinating care.   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    Psychiatric history is significant for prior diagnoses including Schizoaffective Disorder, Depression, Polysubstance Abuse (Meth, Cocaine, THC), and Medication Non-Compliance, 1 Suicide Attempt (wrapped towel around neck 2008), and Multiple Prior Psychiatric Hospitalizations (last- [facility] 06/2024), and no history Self Injurious Behavior.    Interval events: Patient has been documented to be endorsing AH, denying SI and HI, participating in selective groups. He only slept 2 hrs overnight. Peer support specialist to meet patient this afternoon. BP (!) 144/110   Pulse 84   Temp 98.3 F (36.8 C) (Oral)   Resp 16   SpO2 96%     Interview today: Today the patient states he is alright. Denies current AH, and states these have gotten better. A few times during interview discusses unclear topics regarding childhood and how this relates to adult life. Is able to be redirected back to topics and denies SI and HI today. Attributes poor sleep to being an after-effect of incarceration, where he needed to stay vigilant. Denies being triggered by sounds here in the hospital. Does feel tired today. No additional concerns reported  except to wonder when he will leave and where he will go.     Past Medical History:  Past Medical History:  Diagnosis Date   Hypertension    Schizophrenia (HCC)    No past surgical history on file. Family History: No family history on file. Family Psychiatric  History:  Reports No Known Diagnosis', Substance Abuse, or Suicides   Social History:  Social History   Substance and Sexual Activity  Alcohol Use Yes   Comment: occassionally     Social History   Substance and Sexual Activity  Drug Use Yes   Types: Cocaine, Marijuana   Comment: Daily; last three years    Social History   Socioeconomic History   Marital status: Married    Spouse name: Not on file   Number of children: Not on file   Years of education: Not on file   Highest education level: Not on file  Occupational History   Not on file  Tobacco Use   Smoking status: Former    Current packs/day: 0.30    Types: Cigarettes   Smokeless tobacco: Not on file  Vaping Use   Vaping status: Every Day   Substances: Nicotine , THC  Substance and Sexual Activity   Alcohol use: Yes    Comment: occassionally   Drug use: Yes    Types: Cocaine, Marijuana    Comment: Daily; last three years   Sexual activity: Yes    Comment: Reports married with kids living in a house  Other Topics Concern   Not on file  Social History Narrative   Not on file   Social Drivers of Health   Tobacco Use: Medium Risk (06/30/2024)  Patient History    Smoking Tobacco Use: Former    Smokeless Tobacco Use: Unknown    Passive Exposure: Not on Actuary Strain: Not on file  Food Insecurity: Patient Unable To Answer (07/05/2024)   Epic    Worried About Programme Researcher, Broadcasting/film/video in the Last Year: Patient unable to answer    Ran Out of Food in the Last Year: Patient unable to answer  Recent Concern: Food Insecurity - Food Insecurity Present (04/17/2024)   Epic    Worried About Programme Researcher, Broadcasting/film/video in the Last Year: Sometimes  true    Ran Out of Food in the Last Year: Sometimes true  Transportation Needs: Patient Declined (07/05/2024)   Epic    Lack of Transportation (Medical): Patient declined    Lack of Transportation (Non-Medical): Patient declined  Recent Concern: Transportation Needs - Unmet Transportation Needs (04/17/2024)   Epic    Lack of Transportation (Medical): Yes    Lack of Transportation (Non-Medical): Yes  Physical Activity: Not on file  Stress: Not on file  Social Connections: Not on file  Depression (PHQ2-9): Low Risk (04/16/2024)   Depression (PHQ2-9)    PHQ-2 Score: 4  Alcohol Screen: Low Risk (04/07/2024)   Alcohol Screen    Last Alcohol Screening Score (AUDIT): 0  Housing: Patient Declined (07/05/2024)   Epic    Unable to Pay for Housing in the Last Year: Patient declined    Number of Times Moved in the Last Year: Not on file    Homeless in the Last Year: Patient declined  Recent Concern: Housing - High Risk (04/07/2024)   Epic    Unable to Pay for Housing in the Last Year: Yes    Number of Times Moved in the Last Year: 2    Homeless in the Last Year: Yes  Utilities: Patient Declined (07/05/2024)   Epic    Threatened with loss of utilities: Patient declined  Health Literacy: Not on file     Current Medications: Current Facility-Administered Medications  Medication Dose Route Frequency Provider Last Rate Last Admin   acetaminophen  (TYLENOL ) tablet 650 mg  650 mg Oral Q6H PRN Delsie Lynwood Morene Lavone, MD       alum & mag hydroxide-simeth (MAALOX/MYLANTA) 200-200-20 MG/5ML suspension 30 mL  30 mL Oral Q4H PRN Delsie Lynwood Morene Lavone, MD       amLODipine  (NORVASC ) tablet 5 mg  5 mg Oral Daily Pashayan, Alexander S, DO   5 mg at 07/08/24 9288   haloperidol  (HALDOL ) tablet 5 mg  5 mg Oral TID PRN Delsie Lynwood Morene Lavone, MD   5 mg at 07/04/24 1028   And   diphenhydrAMINE  (BENADRYL ) capsule 50 mg  50 mg Oral TID PRN Delsie Lynwood Morene Lavone, MD   50 mg at  07/04/24 1027   haloperidol  lactate (HALDOL ) injection 5 mg  5 mg Intramuscular TID PRN Delsie Lynwood Morene Lavone, MD       And   diphenhydrAMINE  (BENADRYL ) injection 50 mg  50 mg Intramuscular TID PRN Delsie Lynwood Morene Lavone, MD       And   LORazepam  (ATIVAN ) injection 2 mg  2 mg Intramuscular TID PRN Delsie Lynwood Morene Lavone, MD       haloperidol  lactate (HALDOL ) injection 10 mg  10 mg Intramuscular TID PRN Delsie Lynwood Morene Lavone, MD       And   diphenhydrAMINE  (BENADRYL ) injection 50 mg  50 mg Intramuscular TID PRN Delsie Lynwood Morene Lavone,  MD       And   LORazepam  (ATIVAN ) injection 2 mg  2 mg Intramuscular TID PRN Delsie Lynwood Morene Lavone, MD       haloperidol  (HALDOL ) tablet 5 mg  5 mg Oral BID Pashayan, Alexander S, DO   5 mg at 07/08/24 9288   hydrOXYzine  (ATARAX ) tablet 50 mg  50 mg Oral TID PRN Delsie Lynwood Morene Lavone, MD   50 mg at 07/05/24 9286   magnesium  hydroxide (MILK OF MAGNESIA) suspension 30 mL  30 mL Oral Daily PRN Delsie Lynwood Morene Lavone, MD       OLANZapine  zydis (ZYPREXA ) disintegrating tablet 15 mg  15 mg Oral BID Delsie Lynwood Morene Lavone, MD   15 mg at 07/08/24 9288   traZODone  (DESYREL ) tablet 50 mg  50 mg Oral QHS PRN,MR X 1 Delsie Lynwood Morene Lavone, MD   50 mg at 07/07/24 2038    Lab Results:  No results found for this or any previous visit (from the past 48 hours).   Blood Alcohol level:  Lab Results  Component Value Date   [facility] <15 06/28/2024   ETH <15 06/12/2024    Metabolic Disorder Labs: Lab Results  Component Value Date   HGBA1C 5.3 07/05/2024   MPG 105.41 07/05/2024   MPG 96.8 10/27/2023   No results found for: PROLACTIN Lab Results  Component Value Date   CHOL 103 07/04/2024   TRIG 123 07/04/2024   HDL 47 07/04/2024   CHOLHDL 2.2 07/04/2024   VLDL 25 07/04/2024   LDLCALC 32 07/04/2024   LDLCALC 66 10/19/2023    Physical Findings: AIMS:  ,  ,  ,  ,  ,  ,    CIWA:    COWS:     Mental Status exam: Appearance: black male of average BMI, tattooes on arms, appropriately groomed  Eye contact: good  Attitude towards examiner coopearative with questions  Psychomotor: no agitation or retardation  Speech: normal in rate and prosody, sometimes rambling Language: no delays  Mood: alright Affect: restricted  Thought content: denying SI and HI, no overt delusional content expressed today  Thought Process: mildly disorganized - tangential; largely redirectable  Perception: denying AVH during interview (reporting voices less), not RTIS  Insight: fair  Judgement: fair   Orientation: x3 Attention/Concentration: fair to good - distractible but redirectable  Memory/Cognition: grossly intact on conversation   Progress Energy of Knowledge: Average    Musculoskeletal: Strength & Muscle Tone: within normal limits Gait & Station: normal Patient leans: N/A Physical Exam Vitals and nursing note reviewed.  Constitutional:      General: He is not in acute distress.    Appearance: Normal appearance. He is normal weight. He is not ill-appearing or toxic-appearing.  HENT:     Head: Normocephalic and atraumatic.  Pulmonary:     Effort: Pulmonary effort is normal.  Musculoskeletal:        General: Normal range of motion.  Neurological:     General: No focal deficit present.     Mental Status: He is alert.    Review of Systems  Unable to perform ROS: Psychiatric disorder   Blood pressure (!) 144/110, pulse 84, temperature 98.3 F (36.8 C), temperature source Oral, resp. rate 16, SpO2 96%. There is no height or weight on file to calculate BMI.   Treatment Plan Summary: Daily contact with patient to assess and evaluate symptoms and progress in treatment and Medication management  Assessment/plan Aaron Rubio is a 44 yr old  male with a psychiatric history most consistent with SAD-BT. He has a history of both psychotic symptoms (hallucinations,  disorganization in thought and behavior) and recurrent affective symptoms including both depression (low mood, anhedonia, SI with prior suicide attempts) and mania (reduced sleep, increased energy, grandiosity, etc.). On this admission the patient presented on 12/31 to [facility] via [facility] for bizarre behavior and disorganized/tangential thoughts and speech.   Patient was initially continued on olanzapine , however due to ongoing mood labiltiy and psychotic symptoms including disorganization prior team began haldol . Today the patient appeared calmer on interview, improved eye contact. Does appear to have improvements in lability, however remains tangential and with significantly reduced sleep (2-3 hrs over the last 2 nights). Will increase haldol  to 5 mg in the morning + 10 mg at night. Given ongoing significantly poor sleep, will conitnue zyprexa  at 15 mg at bedtime for now, however will attempt to taper and utilize haldol  as monotherapy if possible prior to discharge.     Schizoaffective Disorder, Bipolar Type: -Increase Haldol  to 5 mg daily + 10 mg at bedtime or psychosis and mood stability  -Continue Zyprexa  15 mg BID for psychosis and mood stability -Continue Agitation Protocol: Haldol /Ativan /Benadryl      HTN: -Continue Amlodipine  5 mg daily   -Continue PRN's: Tylenol , Maalox, Atarax , Milk of Magnesia, Trazodone    --  The risks/benefits/side-effects/alternatives to medications were discussed in detail with the patient and time was given for questions. The patient consents to medication trials.                -- Metabolic profile and EKG monitoring obtained while on an atypical antipsychotic (BMI: 28.28, Lipid Panel: WNL, HbgA1c: 5.3, EKG: NSR w/ Sinus Arrhythmia w/QTc: 479)              -- Encouraged patient to participate in unit milieu and in scheduled group therapies              -- Short Term Goals: Ability to identify changes in lifestyle to reduce recurrence of condition will improve, Ability  to verbalize feelings will improve, Ability to disclose and discuss suicidal ideas, Ability to demonstrate self-control will improve, Ability to identify and develop effective coping behaviors will improve, Ability to maintain clinical measurements within normal limits will improve, Compliance with prescribed medications will improve, and Ability to identify triggers associated with substance abuse/mental health issues will improve             -- Long Term Goals: Improvement in symptoms so as ready for discharge   Safety and Monitoring:             -- Involuntary admission to inpatient psychiatric unit for safety, stabilization and treatment             -- Daily contact with patient to assess and evaluate symptoms and progress in treatment             -- Patient's case to be discussed in multi-disciplinary team meeting             -- Observation Level : q15 minute checks             -- Vital signs:  q12 hours             -- Precautions: suicide, elopement, and assault  Discharge Planning:              -- Social work and case management to assist with discharge planning and identification of hospital follow-up needs prior to  discharge             -- Estimated LOS: 5-7 more days             -- Discharge Concerns: Need to establish a safety plan; Medication compliance and effectiveness             -- Discharge Goals: Return home with outpatient referrals for mental health follow-up including medication management/psychotherapy  Leita LOISE Arts, MD 07/08/2024, 8:19 AM

## 2024-07-09 ENCOUNTER — Encounter (HOSPITAL_COMMUNITY): Payer: Self-pay

## 2024-07-09 MED ORDER — OLANZAPINE 15 MG PO TBDP: 15.0000 mg | ORAL_TABLET | Freq: Every day | ORAL | Status: DC

## 2024-07-09 MED ORDER — OLANZAPINE 15 MG PO TBDP
15.0000 mg | ORAL_TABLET | Freq: Two times a day (BID) | ORAL | Status: DC
  Administered 2024-07-09 – 2024-07-10 (×2): 15 mg via ORAL
  Filled 2024-07-09 (×2): qty 1

## 2024-07-09 MED ORDER — AMLODIPINE BESYLATE 10 MG PO TABS
10.0000 mg | ORAL_TABLET | Freq: Every day | ORAL | Status: DC
  Administered 2024-07-10 – 2024-07-14 (×5): 10 mg via ORAL
  Filled 2024-07-09 (×5): qty 1

## 2024-07-09 NOTE — Group Note (Signed)
 Date:  07/09/2024 Time:  1:22 PM  Group Topic/Focus: Emotional Wellness This group focuses on healthy boundaries and emotional energy. We encourage self-awareness, respect, and personal responsibility to create a supportive and balanced space.   Participation Level:  Did Not Attend  Participation Quality:  NA  Affect:  NA  Cognitive:  NA  Insight: NA  Engagement in Group:  NA  Modes of Intervention:  NA  Additional Comments:  Patient did not attend Emotional Wellness Group.  Rosaleen BIRCH Sylvia Kondracki 07/09/2024, 1:22 PM

## 2024-07-09 NOTE — Group Note (Signed)
 Date:  07/09/2024 Time:  9:00 AM  Group Topic/Focus:  Goals Group:   The focus of this group is to help patients establish daily goals to achieve during treatment and discuss how the patient can incorporate goal setting into their daily lives to aide in recovery.    Participation Level:  Did Not Attend  Participation Quality:  NA   Affect:  NA  Cognitive:  NA  Insight: NA  Engagement in Group:  NA  Modes of Intervention:  NA  Additional Comments:  Patient did not attend Goals Group.  Nadirah Socorro D Enrigue Hashimi 07/09/2024, 9:00 AM

## 2024-07-09 NOTE — Plan of Care (Signed)

## 2024-07-09 NOTE — Group Note (Signed)
 Date:  07/09/2024 Time:  3:53 PM  Group Topic/Focus:  Dimensions of Wellness:   The focus of this group is to introduce the topic of wellness and discuss the role each dimension of wellness plays in total health. Overcoming Stress:   The focus of this group is to define stress and help patients assess their triggers.    Participation Level:  Did Not Attend   [name] 07/09/2024, 3:53 PM

## 2024-07-09 NOTE — Group Note (Signed)
 Date:  07/09/2024 Time:  11:55 AM  Group Topic/Focus: Physical Wellness      Participation Level:  Minimal  Participation Quality:  Appropriate  Affect:  Appropriate  Cognitive:  Appropriate  Insight: Appropriate  Engagement in Group:  Limited  Modes of Intervention:  Activity  Additional Comments:  Patient attended group with minimal activity.  Aaron Rubio Kathi Dohn 07/09/2024, 11:55 AM

## 2024-07-09 NOTE — Plan of Care (Signed)
   Problem: Education: Goal: Emotional status will improve Outcome: Progressing Goal: Mental status will improve Outcome: Progressing Goal: Verbalization of understanding the information provided will improve Outcome: Progressing

## 2024-07-09 NOTE — BH IP Treatment Plan (Addendum)
 Interdisciplinary Treatment and Diagnostic Plan Update  07/09/2024 Time of Session: 11:35 AM - UPDATE Aaron Rubio MRN: [id number]  Principal Diagnosis: Schizoaffective disorder, bipolar type (HCC)  Secondary Diagnoses: Principal Problem:   Schizoaffective disorder, bipolar type (HCC)   Current Medications:  Current Facility-Administered Medications  Medication Dose Route Frequency Provider Last Rate Last Admin   acetaminophen  (TYLENOL ) tablet 650 mg  650 mg Oral Q6H PRN Wise, James Benjamin Stallworth, MD       alum & mag hydroxide-simeth (MAALOX/MYLANTA) 200-200-20 MG/5ML suspension 30 mL  30 mL Oral Q4H PRN Delsie Lynwood Morene Lavone, MD       [START ON 07/10/2024] amLODipine  (NORVASC ) tablet 10 mg  10 mg Oral Daily Towana Leita SAILOR, MD       haloperidol  (HALDOL ) tablet 5 mg  5 mg Oral TID PRN Delsie Lynwood Morene Lavone, MD   5 mg at 07/04/24 1028   And   diphenhydrAMINE  (BENADRYL ) capsule 50 mg  50 mg Oral TID PRN Delsie Lynwood Morene Lavone, MD   50 mg at 07/04/24 1027   haloperidol  lactate (HALDOL ) injection 5 mg  5 mg Intramuscular TID PRN Delsie Lynwood Morene Lavone, MD       And   diphenhydrAMINE  (BENADRYL ) injection 50 mg  50 mg Intramuscular TID PRN Delsie Lynwood Morene Lavone, MD       And   LORazepam  (ATIVAN ) injection 2 mg  2 mg Intramuscular TID PRN Delsie Lynwood Morene Lavone, MD       haloperidol  lactate (HALDOL ) injection 10 mg  10 mg Intramuscular TID PRN Delsie Lynwood Morene Lavone, MD       And   diphenhydrAMINE  (BENADRYL ) injection 50 mg  50 mg Intramuscular TID PRN Delsie Lynwood Morene Lavone, MD       And   LORazepam  (ATIVAN ) injection 2 mg  2 mg Intramuscular TID PRN Delsie Lynwood Morene Lavone, MD       haloperidol  (HALDOL ) tablet 5 mg  5 mg Oral Daily Towana Leita SAILOR, MD   5 mg at 07/09/24 9270   And   haloperidol  (HALDOL ) tablet 10 mg  10 mg Oral QHS Butler, Laura N, MD   10 mg at 07/08/24 2048   hydrOXYzine   (ATARAX ) tablet 50 mg  50 mg Oral TID PRN Delsie Lynwood Morene Lavone, MD   50 mg at 07/08/24 2048   magnesium  hydroxide (MILK OF MAGNESIA) suspension 30 mL  30 mL Oral Daily PRN Delsie Lynwood Morene Lavone, MD       [START ON 07/10/2024] OLANZapine  zydis (ZYPREXA ) disintegrating tablet 15 mg  15 mg Oral QHS Towana Leita SAILOR, MD       traZODone  (DESYREL ) tablet 50 mg  50 mg Oral QHS PRN,MR X 1 Delsie Lynwood Morene Lavone, MD   50 mg at 07/08/24 2048   PTA Medications: No medications prior to admission.    Patient Stressors:    Patient Strengths:    Treatment Modalities: Medication Management, Group therapy, Case management,  1 to 1 session with clinician, Psychoeducation, Recreational therapy.   Physician Treatment Plan for Primary Diagnosis: Schizoaffective disorder, bipolar type (HCC) Long Term Goal(s): Improvement in symptoms so as ready for discharge   Short Term Goals: Ability to identify changes in lifestyle to reduce recurrence of condition will improve Ability to maintain clinical measurements within normal limits will improve Compliance with prescribed medications will improve Ability to identify triggers associated with substance abuse/mental health issues will improve  Medication Management: Evaluate patient's response, side effects, and  tolerance of medication regimen.  Therapeutic Interventions: 1 to 1 sessions, Unit Group sessions and Medication administration.  Evaluation of Outcomes: Progressing  Physician Treatment Plan for Secondary Diagnosis: Principal Problem:   Schizoaffective disorder, bipolar type (HCC)  Long Term Goal(s): Improvement in symptoms so as ready for discharge   Short Term Goals: Ability to identify changes in lifestyle to reduce recurrence of condition will improve Ability to maintain clinical measurements within normal limits will improve Compliance with prescribed medications will improve Ability to identify triggers associated  with substance abuse/mental health issues will improve     Medication Management: Evaluate patient's response, side effects, and tolerance of medication regimen.  Therapeutic Interventions: 1 to 1 sessions, Unit Group sessions and Medication administration.  Evaluation of Outcomes: Progressing   RN Treatment Plan for Primary Diagnosis: Schizoaffective disorder, bipolar type (HCC) Long Term Goal(s): Knowledge of disease and therapeutic regimen to maintain health will improve  Short Term Goals: Ability to remain free from injury will improve, Ability to verbalize frustration and anger appropriately will improve, Ability to verbalize feelings will improve, and Ability to disclose and discuss suicidal ideas  Medication Management: RN will administer medications as ordered by provider, will assess and evaluate patient's response and provide education to patient for prescribed medication. RN will report any adverse and/or side effects to prescribing provider.  Therapeutic Interventions: 1 on 1 counseling sessions, Psychoeducation, Medication administration, Evaluate responses to treatment, Monitor vital signs and CBGs as ordered, Perform/monitor CIWA, COWS, AIMS and Fall Risk screenings as ordered, Perform wound care treatments as ordered.  Evaluation of Outcomes: Progressing   LCSW Treatment Plan for Primary Diagnosis: Schizoaffective disorder, bipolar type (HCC) Long Term Goal(s): Safe transition to appropriate next level of care at discharge, Engage patient in therapeutic group addressing interpersonal concerns.  Short Term Goals: Engage patient in aftercare planning with referrals and resources, Increase ability to appropriately verbalize feelings, Facilitate acceptance of mental health diagnosis and concerns, and Identify triggers associated with mental health/substance abuse issues  Therapeutic Interventions: Assess for all discharge needs, 1 to 1 time with Social worker, Explore available  resources and support systems, Assess for adequacy in community support network, Educate family and significant other(s) on suicide prevention, Complete Psychosocial Assessment, Interpersonal group therapy.  Evaluation of Outcomes: Progressing   Progress in Treatment: Attending groups: No. Participating in groups: No. Taking medication as prescribed: Yes. Toleration medication: Yes. Family/Significant other contact made: Yes, contacted Harlene (Peer Support) from [facility], [phone]  Patient understands diagnosis: No. Discussing patient identified problems/goals with staff: No. Medical problems stabilized or resolved: Yes. Denies suicidal/homicidal ideation: Yes. Issues/concerns per patient self-inventory: No.   New problem(s) identified:  No   New Short Term/Long Term Goal(s):     medication stabilization, elimination of SI thoughts, development of comprehensive mental wellness plan.      Patient Goals:  I want to apply for Social Security benefits.   Discharge Plan or Barriers:  Patient recently admitted. CSW will continue to follow and assess for appropriate referrals and possible discharge planning.      Reason for Continuation of Hospitalization: Hallucinations Medication stabilization   Estimated Length of Stay:  4 - 6 days  Last 3 [place] Suicide Severity Risk Score: Flowsheet Row Ad[facility] (Current) [facility] from 07/02/2024 in [facility] ED from 07/01/2024 in [facility]  C-SSRS RISK CATEGORY Low Risk Error: Question 6 not  populated No Risk    Last PHQ 2/9 Scores:    07/02/2024   10:41 AM 04/17/2024    8:17 PM 04/16/2024    9:59 AM  Depression screen PHQ 2/9  Decreased Interest -- -- 1  Down, Depressed, Hopeless -- -- 1  PHQ - 2 Score   2  Altered sleeping   1  Tired, decreased energy    0  Change in appetite   0  Feeling bad or failure about yourself    0  Trouble concentrating   1  Moving slowly or fidgety/restless   0  Suicidal thoughts   0  PHQ-9 Score   4   Difficult doing work/chores   Somewhat difficult     Data saved with a previous flowsheet row definition    Scribe for Treatment Team: Imir Brumbach O Mukesh Kornegay, LCSW 07/09/2024 10:22 AM

## 2024-07-09 NOTE — [facility] Group Notes (Signed)
 Patient did not attend Recreational Therapy Group.

## 2024-07-09 NOTE — Progress Notes (Addendum)
 Collateral contact - Harlene (Peer Support) from [place] [facility] [employer], [phone]   Peer Support said that she submitted the referral to the [facility] regarding temporary housing [employer]) and they are reviewing it.  She hopes to hear from them tomorrow (Thursday).  Peer Support is attempting to arrange an appointment with Mr. Andra (Disability Advocacy) on Monday after she picks him up from the hospital.  Marinell with patient: CSW offered to send a referral for ACTT services, but patient declined.   Val Schiavo, LCSWA 07/09/2024

## 2024-07-09 NOTE — Group Note (Signed)
 Recreation Therapy Group Note   Group Topic:Communication  Group Date: 07/09/2024 Start Time: 1025 End Time: 1048 Facilitators: Madisen Ludvigsen-McCall, LRT,CTRS Location: [address] Dayroom   Group Topic: Communication, Problem Solving   Goal Area(s) Addresses:  Patient will effectively listen to complete activity.  Patient will identify communication skills used to make activity successful.  Patient will identify how skills used during activity can be used to reach post d/c goals.    Behavioral Response:    Intervention: Building Surveyor Activity - Geometric pattern cards, pencils, blank paper    Activity: Geometric Drawings.  Three volunteers from the peer group will be shown an abstract picture with a particular arrangement of geometrical shapes.  Each round, one 'speaker' will describe the pattern, as accurately as possible without revealing the image to the group.  The remaining group members will listen and draw the picture to reflect how it is described to them. Patients with the role of 'listener' cannot ask clarifying questions but, may request that the speaker repeat a direction. Once the drawings are complete, the presenter will show the rest of the group the picture and compare how close each person came to drawing the picture. LRT will facilitate a post-activity discussion regarding effective communication and the importance of planning, listening, and asking for clarification in daily interactions with others.  Education: Environmental consultant, Active listening, Support systems, Discharge planning  Education Outcome: Acknowledges understanding/In group clarification offered/Needs additional education.    Affect/Mood: N/A   Participation Level: Did not attend    Clinical Observations/Individualized Feedback:      Plan: Continue to engage patient in RT group sessions 2-3x/week.   Melani Brisbane-McCall, LRT,CTRS 07/09/2024 1:21 PM

## 2024-07-09 NOTE — BH Assessment (Signed)
(  Sleep Hours) - 9.75 (Any PRNs that were needed, meds refused, or side effects to meds)-  (Any disturbances and when (visitation, over night)- None (Concerns raised by the patient)- None (SI/HI/AVH)- Denies with mild confusion

## 2024-07-09 NOTE — Group Note (Signed)
 Date:  07/09/2024 Time:  8:14 PM  Group Topic/Focus:  Wrap-Up Group:   The focus of this group is to help patients review their daily goal of treatment and discuss progress on daily workbooks.    Participation Level:  Did Not Attend  Participation Quality:  N/A  Affect:  N/A  Cognitive:  N/A  Insight: None  Engagement in Group:  N/A  Modes of Intervention:  N/A  Additional Comments:  Patient did not attend  Eward Mace 07/09/2024, 8:14 PM

## 2024-07-09 NOTE — Progress Notes (Signed)
 [facility] MD Progress Note  07/09/2024 7:56 AM Aaron Rubio  MRN:  [id number]   Principal Problem: Schizoaffective disorder, bipolar type (HCC) Diagnosis: Principal Problem:   Schizoaffective disorder, bipolar type (HCC)  Total Time spent with patient:  I personally spent 35 minutes on the unit in direct patient care. The direct patient care time included face-to-face time with the patient, reviewing the patient's chart, communicating with other professionals, and coordinating care.   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    Psychiatric history is significant for prior diagnoses including Schizoaffective Disorder, Depression, Polysubstance Abuse (Meth, Cocaine, THC), and Medication Non-Compliance, 1 Suicide Attempt (wrapped towel around neck 2008), and Multiple Prior Psychiatric Hospitalizations (last- [facility] 06/2024), and no history Self Injurious Behavior.    Interval events: Patient's peer support worker, Harlene, visited in-person yesterday. Confirmed that she will be able to pick him up on Monday 1/12 to take him to winter shelter.  Patient largely otherwise isolative to room yesterday, but did attend wrap-up groups with good engagement. Denying SI, HI and AVH, slept 9.75 hrs overnight (significantly improved from 2-3 hrs the few nights prior).  BP (!) 154/109 (BP Location: Right Arm)   Pulse 87   Temp 97.9 F (36.6 C) (Oral)   Resp 16   SpO2 100%     Interview today: Today the patient reports he is fine. He did sleep better last night. When asked what he was watching he states a new ghostbusters, I guess. Denies any concerns. Explained plan to likely transition him from zyprexa  to haldol  to have him be on one medication before  he leaves, however patient states no, I prefer the zyprexa . Notified patient that at highest dose of zyprexa  it was not effective and that we will compromise on continuing current management for now. Denies recent AVH, stating the last time he heard anything was at the bank. Denies SI. Notified patient about the winter haven that he will be able to get into on Monday and he expresses understanding. Only other concern was getting some sort of voucher for clothing and food. Notified him that likely the winter haven and peer support specialist would be able to assist with this once discharged. No additional questions or concerns today.   Past Medical History:  Past Medical History:  Diagnosis Date   Hypertension    Schizophrenia (HCC)    No past surgical history on file. Family History: No family history on file. Family Psychiatric  History:  Reports No Known Diagnosis', Substance Abuse, or Suicides   Social History:  Social History   Substance and Sexual Activity  Alcohol Use Yes   Comment: occassionally     Social History   Substance and Sexual Activity  Drug Use Yes   Types: Cocaine, Marijuana   Comment: Daily; last three years    Social History   Socioeconomic History   Marital status: Married    Spouse name: Not on file   Number of children: Not on file   Years of education: Not on file   Highest education level: Not on file  Occupational History   Not on file  Tobacco Use   Smoking status: Former    Current packs/day: 0.30    Types: Cigarettes   Smokeless tobacco: Not  on file  Vaping Use   Vaping status: Every Day   Substances: Nicotine , THC  Substance and Sexual Activity   Alcohol use: Yes    Comment: occassionally   Drug use: Yes    Types: Cocaine, Marijuana    Comment: Daily; last three years   Sexual activity: Yes    Comment: Reports married with kids living in a house  Other Topics Concern   Not on file  Social History Narrative   Not on file    Social Drivers of Health   Tobacco Use: Medium Risk (06/30/2024)   Patient History    Smoking Tobacco Use: Former    Smokeless Tobacco Use: Unknown    Passive Exposure: Not on Actuary Strain: Not on file  Food Insecurity: Patient Unable To Answer (07/05/2024)   Epic    Worried About Programme Researcher, Broadcasting/film/video in the Last Year: Patient unable to answer    Ran Out of Food in the Last Year: Patient unable to answer  Recent Concern: Food Insecurity - Food Insecurity Present (04/17/2024)   Epic    Worried About Programme Researcher, Broadcasting/film/video in the Last Year: Sometimes true    Ran Out of Food in the Last Year: Sometimes true  Transportation Needs: Patient Declined (07/05/2024)   Epic    Lack of Transportation (Medical): Patient declined    Lack of Transportation (Non-Medical): Patient declined  Recent Concern: Transportation Needs - Unmet Transportation Needs (04/17/2024)   Epic    Lack of Transportation (Medical): Yes    Lack of Transportation (Non-Medical): Yes  Physical Activity: Not on file  Stress: Not on file  Social Connections: Not on file  Depression (PHQ2-9): Low Risk (04/16/2024)   Depression (PHQ2-9)    PHQ-2 Score: 4  Alcohol Screen: Low Risk (04/07/2024)   Alcohol Screen    Last Alcohol Screening Score (AUDIT): 0  Housing: Patient Declined (07/05/2024)   Epic    Unable to Pay for Housing in the Last Year: Patient declined    Number of Times Moved in the Last Year: Not on file    Homeless in the Last Year: Patient declined  Recent Concern: Housing - High Risk (04/07/2024)   Epic    Unable to Pay for Housing in the Last Year: Yes    Number of Times Moved in the Last Year: 2    Homeless in the Last Year: Yes  Utilities: Patient Declined (07/05/2024)   Epic    Threatened with loss of utilities: Patient declined  Health Literacy: Not on file     Current Medications: Current Facility-Administered Medications  Medication Dose Route Frequency Provider Last Rate Last  Admin   acetaminophen  (TYLENOL ) tablet 650 mg  650 mg Oral Q6H PRN Delsie Lynwood Morene Lavone, MD       alum & mag hydroxide-simeth (MAALOX/MYLANTA) 200-200-20 MG/5ML suspension 30 mL  30 mL Oral Q4H PRN Delsie Lynwood Morene Lavone, MD       amLODipine  (NORVASC ) tablet 5 mg  5 mg Oral Daily Pashayan, Alexander S, DO   5 mg at 07/09/24 9270   haloperidol  (HALDOL ) tablet 5 mg  5 mg Oral TID PRN Delsie Lynwood Morene Lavone, MD   5 mg at 07/04/24 1028   And   diphenhydrAMINE  (BENADRYL ) capsule 50 mg  50 mg Oral TID PRN Delsie Lynwood Morene Lavone, MD   50 mg at 07/04/24 1027   haloperidol  lactate (HALDOL ) injection 5 mg  5 mg Intramuscular TID PRN Delsie Lynwood  Morene Deems, MD       And   diphenhydrAMINE  (BENADRYL ) injection 50 mg  50 mg Intramuscular TID PRN Delsie Lynwood Morene Deems, MD       And   LORazepam  (ATIVAN ) injection 2 mg  2 mg Intramuscular TID PRN Delsie Lynwood Morene Deems, MD       haloperidol  lactate (HALDOL ) injection 10 mg  10 mg Intramuscular TID PRN Delsie Lynwood Morene Deems, MD       And   diphenhydrAMINE  (BENADRYL ) injection 50 mg  50 mg Intramuscular TID PRN Delsie Lynwood Morene Deems, MD       And   LORazepam  (ATIVAN ) injection 2 mg  2 mg Intramuscular TID PRN Delsie Lynwood Morene Deems, MD       haloperidol  (HALDOL ) tablet 5 mg  5 mg Oral Daily Towana Leita SAILOR, MD   5 mg at 07/09/24 9270   And   haloperidol  (HALDOL ) tablet 10 mg  10 mg Oral QHS Towana Leita SAILOR, MD   10 mg at 07/08/24 2048   hydrOXYzine  (ATARAX ) tablet 50 mg  50 mg Oral TID PRN Delsie Lynwood Morene Deems, MD   50 mg at 07/08/24 2048   magnesium  hydroxide (MILK OF MAGNESIA) suspension 30 mL  30 mL Oral Daily PRN Delsie Lynwood Morene Deems, MD       OLANZapine  zydis (ZYPREXA ) disintegrating tablet 15 mg  15 mg Oral BID Delsie Lynwood Morene Deems, MD   15 mg at 07/09/24 9270   traZODone  (DESYREL ) tablet 50 mg  50 mg Oral QHS PRN,MR X 1 Delsie Lynwood Morene Deems, MD   50 mg at 07/08/24 2048    Lab Results:  No results found for this or any previous visit (from the past 48 hours).   Blood Alcohol level:  Lab Results  Component Value Date   [facility] <15 06/28/2024   ETH <15 06/12/2024    Metabolic Disorder Labs: Lab Results  Component Value Date   HGBA1C 5.3 07/05/2024   MPG 105.41 07/05/2024   MPG 96.8 10/27/2023   No results found for: PROLACTIN Lab Results  Component Value Date   CHOL 103 07/04/2024   TRIG 123 07/04/2024   HDL 47 07/04/2024   CHOLHDL 2.2 07/04/2024   VLDL 25 07/04/2024   LDLCALC 32 07/04/2024   LDLCALC 66 10/19/2023    Physical Findings: AIMS:  ,  ,  ,  ,  ,  ,   CIWA:    COWS:     Mental Status exam: Appearance: black male of average BMI, tattooes on arms, appropriately groomed, seen watching television in the day area, does ambulate to the hall for interview   Eye contact: good  Attitude towards examiner coopearative with questions, somewhat irritable   Psychomotor: no agitation or retardation  Speech: normal in rate and prosody, sometimes rambling Language: no delays  Mood: fine Affect: restricted  Thought content: denying SI and HI, no overt delusional content expressed today  Thought Process: mildly disorganized - tangential Perception: denying AVH, not RTIS  Insight: fair  Judgement: fair   Orientation: x3 Attention/Concentration: fair to good - distractible but redirectable  Memory/Cognition: grossly intact on conversation   Progress Energy of Knowledge: Average    Musculoskeletal: Strength & Muscle Tone: within normal limits Gait & Station: normal Patient leans: N/A  Physical Exam Vitals and nursing note reviewed.  Constitutional:      General: He is not in acute distress.    Appearance: Normal appearance. He is normal weight.  He is not ill-appearing or toxic-appearing.  HENT:     Head: Normocephalic and atraumatic.  Pulmonary:     Effort: Pulmonary effort is  normal.  Musculoskeletal:        General: Normal range of motion.  Neurological:     General: No focal deficit present.     Mental Status: He is alert.    Review of Systems  Unable to perform ROS: Psychiatric disorder   Blood pressure (!) 154/109, pulse 87, temperature 97.9 F (36.6 C), temperature source Oral, resp. rate 16, SpO2 100%. There is no height or weight on file to calculate BMI.   Treatment Plan Summary: Daily contact with patient to assess and evaluate symptoms and progress in treatment and Medication management  Assessment/plan Timo Hartwig. Carre is a 44 yr old male with a psychiatric history most consistent with SAD-BT. He has a history of both psychotic symptoms (hallucinations, disorganization in thought and behavior) and recurrent affective symptoms including both depression (low mood, anhedonia, SI with prior suicide attempts) and mania (reduced sleep, increased energy, grandiosity, etc.). On this admission the patient presented on 12/31 to [facility] via [facility] for bizarre behavior and disorganized/tangential thoughts and speech.   Patient was initially continued on olanzapine , however due to ongoing mood labiltiy and psychotic symptoms including disorganization prior team began haldol . As of 1/6 the patient appeared calmer on interview, improved eye contact with improvements in lability, however remained tangential and with significantly reduced sleep (2-3 hrs over the last 2 nights). Haldol  to 5 mg in the morning + 10 mg at night with significantly improved sleep (>9 hrs) that evening. Still remains isolative, irritable, and mildly tangential but overall demonstrating improvements. Plan has been to cross-taper to monotherapy if possible, however patient was resistant to going up any more on haldol  and expressed preference for zyprexa  monotherapy, which was not fully effective. Will instead continue with current doses of both zyprexa  and haldol  given improvements and continue to discuss.  Patient's BP has also been high so will increase amlodipine  to 10 mg.     Schizoaffective Disorder, Bipolar Type: -Continue Haldol  to 5 mg daily + 10 mg at bedtime for psychosis and mood stability  -Continue Zyprexa  15 mg BID for psychosis and mood stability    -Continue Agitation Protocol: Haldol /Ativan /Benadryl      HTN: -Increase Amlodipine  to 10 mg daily for tomorrow   -Continue PRN's: Tylenol , Maalox, Atarax , Milk of Magnesia, Trazodone    --  The risks/benefits/side-effects/alternatives to medications were discussed in detail with the patient and time was given for questions. The patient consents to medication trials.                -- Metabolic profile and EKG monitoring obtained while on an atypical antipsychotic (BMI: 28.28, Lipid Panel: WNL, HbgA1c: 5.3, EKG: NSR w/ Sinus Arrhythmia w/QTc: 479)              -- Encouraged patient to participate in unit milieu and in scheduled group therapies              -- Short Term Goals: Ability to identify changes in lifestyle to reduce recurrence of condition will improve, Ability to verbalize feelings will improve, Ability to disclose and discuss suicidal ideas, Ability to demonstrate self-control will improve, Ability to identify and develop effective coping behaviors will improve, Ability to maintain clinical measurements within normal limits will improve, Compliance with prescribed medications will improve, and Ability to identify triggers associated with substance abuse/mental health issues will improve             --  Long Term Goals: Improvement in symptoms so as ready for discharge   Safety and Monitoring:             -- Involuntary admission to inpatient psychiatric unit for safety, stabilization and treatment             -- Daily contact with patient to assess and evaluate symptoms and progress in treatment             -- Patient's case to be discussed in multi-disciplinary team meeting             -- Observation Level : q15 minute  checks             -- Vital signs:  q12 hours             -- Precautions: suicide, elopement, and assault  Discharge Planning:              -- Social work and case management to assist with discharge planning and identification of hospital follow-up needs prior to discharge             -- Estimated LOS: 5-7 more days             -- Discharge Concerns: Need to establish a safety plan; Medication compliance and effectiveness             -- Discharge Goals: Return home with outpatient referrals for mental health follow-up including medication management/psychotherapy  Leita LOISE Arts, MD 07/09/2024, 7:56 AM

## 2024-07-10 MED ORDER — OLANZAPINE 10 MG PO TBDP
10.0000 mg | ORAL_TABLET | Freq: Two times a day (BID) | ORAL | Status: DC
  Administered 2024-07-10 – 2024-07-11 (×2): 10 mg via ORAL
  Filled 2024-07-10 (×2): qty 1

## 2024-07-10 NOTE — Group Note (Signed)
 Date:  07/10/2024 Time:  12:04 PM  Group Topic/Focus: Physical Wellness     Participation Level:  Active  Participation Quality:  Appropriate  Affect:  Appropriate  Cognitive:  Appropriate  Insight: Appropriate  Engagement in Group:  Engaged  Modes of Intervention:  Activity  Additional Comments:  Patient attended Physical Wellness Group.  Rosaleen BIRCH Caily Rakers 07/10/2024, 12:04 PM

## 2024-07-10 NOTE — Progress Notes (Signed)
 [facility] MD Progress Note  07/10/2024 8:35 AM Aaron Rubio  MRN:  [id number]   Principal Problem: Schizoaffective disorder, bipolar type (HCC) Diagnosis: Principal Problem:   Schizoaffective disorder, bipolar type (HCC)  Total Time spent with patient:  I personally spent 35 minutes on the unit in direct patient care. The direct patient care time included face-to-face time with the patient, reviewing the patient's chart, communicating with other professionals, and coordinating care.   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    Psychiatric history is significant for prior diagnoses including Schizoaffective Disorder, Depression, Polysubstance Abuse (Meth, Cocaine, THC), and Medication Non-Compliance, 1 Suicide Attempt (wrapped towel around neck 2008), and Multiple Prior Psychiatric Hospitalizations (last- [facility] 06/2024), and no history Self Injurious Behavior.    Interval events: Patient documented to be largely isolative to room, did attend a few groups but with limited engagement. No behavioral concerns or agitation protocol required. Denying SI, HI and VH. Patient slept 7 hrs overnight.  BP (!) 124/93 (BP Location: Right Arm)   Pulse 85   Temp 98 F (36.7 C) (Oral)   Resp 20   SpO2 100%    Interview today: Today the patient reports he is fine. States he slept well and has had no concerns on the unit. He largely remains concerned about clothing and food for when he leaves the hospital. Discussed with him that peer support specialist is working on referring him to various programs and he reports understanding that this can be a process. He otherwise denies concerns, denies SI, HI and AVH. Reports adequate energy and appetite.    Past  Medical History:  Past Medical History:  Diagnosis Date   Hypertension    Schizophrenia (HCC)    No past surgical history on file. Family History: No family history on file. Family Psychiatric  History:  Reports No Known Diagnosis', Substance Abuse, or Suicides   Social History:  Social History   Substance and Sexual Activity  Alcohol Use Yes   Comment: occassionally     Social History   Substance and Sexual Activity  Drug Use Yes   Types: Cocaine, Marijuana   Comment: Daily; last three years    Social History   Socioeconomic History   Marital status: Married    Spouse name: Not on file   Number of children: Not on file   Years of education: Not on file   Highest education level: Not on file  Occupational History   Not on file  Tobacco Use   Smoking status: Former    Current packs/day: 0.30    Types: Cigarettes   Smokeless tobacco: Not on file  Vaping Use   Vaping status: Every Day   Substances: Nicotine , THC  Substance and Sexual Activity   Alcohol use: Yes    Comment: occassionally   Drug use: Yes    Types: Cocaine, Marijuana    Comment: Daily; last three years   Sexual activity: Yes    Comment: Reports married with kids living in a house  Other Topics Concern   Not on file  Social History Narrative   Not on file   Social Drivers of Health   Tobacco Use: Medium Risk (06/30/2024)   Patient History    Smoking Tobacco Use: Former  Smokeless Tobacco Use: Unknown    Passive Exposure: Not on file  Financial Resource Strain: Not on file  Food Insecurity: Patient Unable To Answer (07/05/2024)   Epic    Worried About Programme Researcher, Broadcasting/film/video in the Last Year: Patient unable to answer    Ran Out of Food in the Last Year: Patient unable to answer  Recent Concern: Food Insecurity - Food Insecurity Present (04/17/2024)   Epic    Worried About Programme Researcher, Broadcasting/film/video in the Last Year: Sometimes true    Ran Out of Food in the Last Year: Sometimes true   Transportation Needs: Patient Declined (07/05/2024)   Epic    Lack of Transportation (Medical): Patient declined    Lack of Transportation (Non-Medical): Patient declined  Recent Concern: Transportation Needs - Unmet Transportation Needs (04/17/2024)   Epic    Lack of Transportation (Medical): Yes    Lack of Transportation (Non-Medical): Yes  Physical Activity: Not on file  Stress: Not on file  Social Connections: Not on file  Depression (PHQ2-9): Low Risk (04/16/2024)   Depression (PHQ2-9)    PHQ-2 Score: 4  Alcohol Screen: Low Risk (04/07/2024)   Alcohol Screen    Last Alcohol Screening Score (AUDIT): 0  Housing: Patient Declined (07/05/2024)   Epic    Unable to Pay for Housing in the Last Year: Patient declined    Number of Times Moved in the Last Year: Not on file    Homeless in the Last Year: Patient declined  Recent Concern: Housing - High Risk (04/07/2024)   Epic    Unable to Pay for Housing in the Last Year: Yes    Number of Times Moved in the Last Year: 2    Homeless in the Last Year: Yes  Utilities: Patient Declined (07/05/2024)   Epic    Threatened with loss of utilities: Patient declined  Health Literacy: Not on file     Current Medications: Current Facility-Administered Medications  Medication Dose Route Frequency Provider Last Rate Last Admin   acetaminophen  (TYLENOL ) tablet 650 mg  650 mg Oral Q6H PRN Delsie Lynwood Morene Lavone, MD       alum & mag hydroxide-simeth (MAALOX/MYLANTA) 200-200-20 MG/5ML suspension 30 mL  30 mL Oral Q4H PRN Delsie Lynwood Morene Lavone, MD       amLODipine  (NORVASC ) tablet 10 mg  10 mg Oral Daily Towana Leita SAILOR, MD       haloperidol  (HALDOL ) tablet 5 mg  5 mg Oral TID PRN Delsie Lynwood Morene Lavone, MD   5 mg at 07/04/24 1028   And   diphenhydrAMINE  (BENADRYL ) capsule 50 mg  50 mg Oral TID PRN Delsie Lynwood Morene Lavone, MD   50 mg at 07/04/24 1027   haloperidol  lactate (HALDOL ) injection 5 mg  5 mg Intramuscular TID  PRN Delsie Lynwood Morene Lavone, MD       And   diphenhydrAMINE  (BENADRYL ) injection 50 mg  50 mg Intramuscular TID PRN Delsie Lynwood Morene Lavone, MD       And   LORazepam  (ATIVAN ) injection 2 mg  2 mg Intramuscular TID PRN Delsie Lynwood Morene Lavone, MD       haloperidol  lactate (HALDOL ) injection 10 mg  10 mg Intramuscular TID PRN Delsie Lynwood Morene Lavone, MD       And   diphenhydrAMINE  (BENADRYL ) injection 50 mg  50 mg Intramuscular TID PRN Delsie Lynwood Morene Lavone, MD       And   LORazepam  (ATIVAN ) injection 2 mg  2 mg Intramuscular TID PRN Delsie Lynwood Morene Lavone, MD       haloperidol  (HALDOL ) tablet 5 mg  5 mg Oral Daily Khilynn Borntreger N, MD   5 mg at 07/09/24 9270   And   haloperidol  (HALDOL ) tablet 10 mg  10 mg Oral QHS Towana Leita SAILOR, MD   10 mg at 07/09/24 2134   hydrOXYzine  (ATARAX ) tablet 50 mg  50 mg Oral TID PRN Delsie Lynwood Morene Lavone, MD   50 mg at 07/09/24 2134   magnesium  hydroxide (MILK OF MAGNESIA) suspension 30 mL  30 mL Oral Daily PRN Delsie Lynwood Morene Lavone, MD       OLANZapine  zydis (ZYPREXA ) disintegrating tablet 15 mg  15 mg Oral BID Caytlin Better N, MD   15 mg at 07/09/24 1730   traZODone  (DESYREL ) tablet 50 mg  50 mg Oral QHS PRN,MR X 1 Delsie Lynwood Morene Lavone, MD   50 mg at 07/09/24 2134    Lab Results:  No results found for this or any previous visit (from the past 48 hours).   Blood Alcohol level:  Lab Results  Component Value Date   [facility] <15 06/28/2024   ETH <15 06/12/2024    Metabolic Disorder Labs: Lab Results  Component Value Date   HGBA1C 5.3 07/05/2024   MPG 105.41 07/05/2024   MPG 96.8 10/27/2023   No results found for: PROLACTIN Lab Results  Component Value Date   CHOL 103 07/04/2024   TRIG 123 07/04/2024   HDL 47 07/04/2024   CHOLHDL 2.2 07/04/2024   VLDL 25 07/04/2024   LDLCALC 32 07/04/2024   LDLCALC 66 10/19/2023    Physical Findings: AIMS:  ,  ,  ,  ,  ,  ,    CIWA:    COWS:     Mental Status exam: Appearance: black male of average BMI, tattooes on arms, appropriately groomed, seen reclining comfortably in bed  Eye contact: good  Attitude towards examiner coopearative with questions, calmer than prior day Psychomotor: no agitation or retardation  Speech: normal in rate and prosody, sometimes rambling Language: no delays  Mood: fine Affect: restricted  Thought content: denying SI and HI, no overt delusional content expressed today  Thought Process: mildly disorganized - tangential Perception: denying AVH, not RTIS  Insight: fair  Judgement: fair   Orientation: x3 Attention/Concentration: fair to good - distractible but redirectable  Memory/Cognition: grossly intact on conversation   Progress Energy of Knowledge: Average    Musculoskeletal: Strength & Muscle Tone: within normal limits Gait & Station: normal Patient leans: N/A  Physical Exam Vitals and nursing note reviewed.  Constitutional:      General: He is not in acute distress.    Appearance: Normal appearance. He is normal weight. He is not ill-appearing or toxic-appearing.  HENT:     Head: Normocephalic and atraumatic.  Pulmonary:     Effort: Pulmonary effort is normal.  Musculoskeletal:        General: Normal range of motion.  Neurological:     General: No focal deficit present.     Mental Status: He is alert.    Review of Systems  Unable to perform ROS: Psychiatric disorder   Blood pressure (!) 124/93, pulse 85, temperature 98 F (36.7 C), temperature source Oral, resp. rate 20, SpO2 100%. There is no height or weight on file to calculate BMI.   Treatment Plan Summary: Daily contact with patient to assess and evaluate symptoms and progress in treatment and Medication management  Assessment/plan Aaron Rubio is a 44 yr old male with a psychiatric history most consistent with SAD-BT. He has a history of both psychotic symptoms (hallucinations, disorganization in  thought and behavior) and recurrent affective symptoms including both depression (low mood, anhedonia, SI with prior suicide attempts) and mania (reduced sleep, increased energy, grandiosity, etc.). On this admission the patient presented on 12/31 to [facility] via [facility] for bizarre behavior and disorganized/tangential thoughts and speech.   Patient was initially continued on olanzapine , however due to ongoing mood labiltiy and psychotic symptoms including disorganization prior team began haldol . As of 1/6 the patient appeared calmer on interview, improved eye contact with improvements in lability, however remained tangential and with significantly reduced sleep (2-3 hrs over the last 2 nights). Haldol  to 5 mg in the morning + 10 mg at night with significantly improved sleep (>9 hrs) that evening and has continued to have more regular sleep since. He  remains isolative, irritable, and mildly tangential but overall demonstrating improvements. Patient has consistently expressed preference for zyprexa  monotherapy, which was not fully effective. Will instead continue with current doses of both zyprexa  and haldol  given improvements and likely he will go out on dual antipsychotic therapy as compromise   Schizoaffective Disorder, Bipolar Type: -Continue Haldol  to 5 mg daily + 10 mg at bedtime for psychosis and mood stability  -Continue Zyprexa  15 mg BID for psychosis and mood stability    -Continue Agitation Protocol: Haldol /Ativan /Benadryl      HTN: -Increase Amlodipine  to 10 mg daily for tomorrow   -Continue PRN's: Tylenol , Maalox, Atarax , Milk of Magnesia, Trazodone    --  The risks/benefits/side-effects/alternatives to medications were discussed in detail with the patient and time was given for questions. The patient consents to medication trials.                -- Metabolic profile and EKG monitoring obtained while on an atypical antipsychotic (BMI: 28.28, Lipid Panel: WNL, HbgA1c: 5.3, EKG: NSR w/ Sinus  Arrhythmia w/QTc: 479)              -- Encouraged patient to participate in unit milieu and in scheduled group therapies              -- Short Term Goals: Ability to identify changes in lifestyle to reduce recurrence of condition will improve, Ability to verbalize feelings will improve, Ability to disclose and discuss suicidal ideas, Ability to demonstrate self-control will improve, Ability to identify and develop effective coping behaviors will improve, Ability to maintain clinical measurements within normal limits will improve, Compliance with prescribed medications will improve, and Ability to identify triggers associated with substance abuse/mental health issues will improve             -- Long Term Goals: Improvement in symptoms so as ready for discharge   Safety and Monitoring:             -- Involuntary admission to inpatient psychiatric unit for safety, stabilization and treatment             -- Daily contact with patient to assess and evaluate symptoms and progress in treatment             -- Patient's case to be discussed in multi-disciplinary team meeting             -- Observation Level : q15 minute checks             -- Vital signs:  q12 hours             --  Precautions: suicide, elopement, and assault  Discharge Planning:              -- Social work and case management to assist with discharge planning and identification of hospital follow-up needs prior to discharge             -- Estimated LOS: 5-7 more days             -- Discharge Concerns: Need to establish a safety plan; Medication compliance and effectiveness             -- Discharge Goals: Return home with outpatient referrals for mental health follow-up including medication management/psychotherapy  Leita LOISE Arts, MD 07/10/2024, 8:35 AM

## 2024-07-10 NOTE — Plan of Care (Signed)

## 2024-07-10 NOTE — [facility] Group Notes (Signed)
 Adult Psychoeducational Group Note  Date:  07/10/2024 Time:  8:46 PM  Group Topic/Focus:  Wrap-Up Group:   The focus of this group is to help patients review their daily goal of treatment and discuss progress on daily workbooks.  Participation Level:  Active  Participation Quality:  Attentive  Affect:  Appropriate  Cognitive:  Alert  Insight: Appropriate  Engagement in Group:  Engaged  Modes of Intervention:  Discussion  Additional Comments:  Patient attended and participated in the Wrap-up group.  Aaron Rubio 07/10/2024, 8:46 PM

## 2024-07-10 NOTE — [facility] Group Notes (Signed)
 Adult Psychoeducational Group Note  Date:  07/10/2024 Time:  1:21 PM  Group Topic/Focus: Nutrition Group  Participation Level:  Did Not Attend  Participation Quality:    Affect:    Cognitive:    Insight:   Engagement in Group:    Modes of Intervention:    Additional Comments:    Mindy Gali O 07/10/2024, 1:21 PM

## 2024-07-10 NOTE — Group Note (Signed)
 Recreation Therapy Group Note   Group Topic:Relaxation  Group Date: 07/10/2024 Start Time: 1007 End Time: 1045 Facilitators: Dewie Ahart-McCall, LRT,CTRS Location: [address] Dayroom   Group Topic: Stress Management  Goal Area(s) Addresses:  Patient will identify how music can be used for stress management. Patient will identify benefits of using stress management post d/c.  Behavioral Response:   Intervention: Music  Activity: Music Therapy. LRT and patients discussed the benefits of music and its importance. Patients were then given the opportunity to take turns making song requests. Patients could request any song that they liked as long as it was clean and appropriate.    Education:  Music Therapy, Stress Management, Discharge Planning.   Education Outcome: Acknowledges Education   Affect/Mood: N/A   Participation Level: Did not attend    Clinical Observations/Individualized Feedback:      Plan: Continue to engage patient in RT group sessions 2-3x/week.   Bishoy Cupp-McCall, LRT,CTRS 07/10/2024 11:47 AM

## 2024-07-10 NOTE — Plan of Care (Signed)
  Problem: Education: Goal: Knowledge of [facility] Education information/materials will improve Outcome: Progressing Goal: Mental status will improve Outcome: Progressing Goal: Verbalization of understanding the information provided will improve Outcome: Progressing   Problem: Activity: Goal: Interest or engagement in activities will improve Outcome: Progressing Goal: Sleeping patterns will improve Outcome: Progressing   Problem: Coping: Goal: Ability to verbalize frustrations and anger appropriately will improve Outcome: Progressing Goal: Ability to demonstrate self-control will improve Outcome: Progressing

## 2024-07-10 NOTE — [facility] Group Notes (Signed)
 Patient did not attend Recreational Therapy Group.

## 2024-07-10 NOTE — Group Note (Signed)
 Date:  07/10/2024 Time:  8:56 AM  Group Topic/Focus:  Goals Group:   The focus of this group is to help patients establish daily goals to achieve during treatment and discuss how the patient can incorporate goal setting into their daily lives to aide in recovery.    Participation Level:  Did Not Attend  Participation Quality:  NA  Affect:  NA  Cognitive:  NA  Insight: NA  Engagement in Group:  NA  Modes of Intervention:  NA  Additional Comments:  Patient did not attend Goals Group.  Rosaleen BIRCH Jervon Ream 07/10/2024, 8:56 AM

## 2024-07-10 NOTE — BH Assessment (Signed)
(  Sleep Hours) - 7 (Any PRNs that were needed, meds refused, or side effects to meds)-  (Any disturbances and when (visitation, over night)- None (Concerns raised by the patient)- None (SI/HI/AVH)- Denies

## 2024-07-10 NOTE — Group Note (Signed)
 Date:  07/10/2024 Time:  6:25 PM  Group Topic/Focus:  Dimensions of Wellness:   The focus of this group is to introduce the topic of wellness and discuss the role each dimension of wellness plays in total health.    Participation Level:  Did Not Attend    [name] 07/10/2024, 6:25 PM

## 2024-07-11 MED ORDER — OLANZAPINE 15 MG PO TBDP: 15.0000 mg | ORAL_TABLET | Freq: Every day | ORAL | Status: DC

## 2024-07-11 MED ORDER — OLANZAPINE 10 MG PO TBDP
20.0000 mg | ORAL_TABLET | Freq: Every day | ORAL | Status: DC
  Administered 2024-07-12 – 2024-07-13 (×2): 20 mg via ORAL
  Filled 2024-07-11 (×2): qty 2

## 2024-07-11 NOTE — Progress Notes (Signed)
 Tour of Duty:  Prentice JINNY Angle, RN, 07/11/2024, Tour of Duty: 0700-1900  SI/HI/AVH: Denies  Self-Reported   Mood: Neutral  Anxiety: Denies Depression: Denies, but Observable Irritability: Denies, but Observable  Broset  Violence Prevention Guidelines *See Row Information*: Small Violence Risk interventions implemented   LBM  Last BM Date : 07/10/24   Pain: not present  Patient Refusals (including Rx): No  >>Shift Summary: Patient observed to be calm but isolative on unit. Patient able to make needs known, but mildly irritable. Patient observed to engage appropriately with staff and peers, but no initiation. Patient taking medications as prescribed. This shift, no PRN medication requested or required. No reported or observed side effects to medication. No reported or observed agitation, aggression, or other acute emotional distress. No reported or observed physical abnormalities or concerns.  Last Vitals   Vitals Weight: (not recorded) Temp: 98.5 F (36.9 C) Temp Source: Oral Pulse Rate: 79 Resp: 20 BP: (!) 129/90 Patient Position: (not recorded)  Admission Type  Psych Admission Type (Psych Patients Only) Admission Status: Involuntary Date 72 hour document signed : (not recorded) Time 72 hour document signed : (not recorded) Provider Notified (First and Last Name) (see details for LINK to note): (not recorded)   Psychosocial Assessment  Psychosocial Assessment Patient Complaints: None Eye Contact: Fair Facial Expression: Flat Affect: Flat Speech: Tangential Interaction: Minimal, Superficial Motor Activity: Other (Comment) Appearance/Hygiene: Unremarkable Behavior Characteristics: Appropriate to situation Mood: Suspicious   Aggressive Behavior  Targets: (not recorded)   Thought Process  Thought Process Coherency: Disorganized Content: Preoccupation Delusions: None reported or observed Perception: Within Defined Limits Hallucination: None reported or  observed Judgment: Limited Confusion: None  Danger to Self/Others  Danger to Self Current suicidal ideation?: Denies Description of Suicide Plan: (not recorded) Self-Injurious Behavior: (not recorded) Agreement Not to Harm Self: (not recorded) Description of Agreement: (not recorded) Danger to Others: None reported or observed

## 2024-07-11 NOTE — [facility] Group Notes (Signed)
 Adult Psychoeducational Group Note  Date:  07/11/2024 Time:  9:36 PM  Group Topic/Focus:  Wrap-Up Group:   The focus of this group is to help patients review their daily goal of treatment and discuss progress on daily workbooks.  Participation Level:  Did Not Attend  Participation Quality:  Did not attend  Affect:  Did not attend  Cognitive:  Did not attend  Insight: None  Engagement in Group:  Did not attend  Modes of Intervention:  Did not attend  Additional Comments:  Aaron Rubio did not attend  Lang Drilling Long 07/11/2024, 9:36 PM

## 2024-07-11 NOTE — Group Note (Signed)
 Recreation Therapy Group Note   Group Topic:Leisure Education  Group Date: 07/11/2024 Start Time: 1025 End Time: 1041 Facilitators: Yesha Muchow-McCall, LRT,CTRS Location: [address] Dayroom   Group Topic: Leisure Education  Goal Area(s) Addresses:  Patient will identify positive leisure activities for use post discharge. Patient will identify at least one positive benefit of participation in leisure activities.   Behavioral Response:    Intervention: Innovation, Group Presentation   Activity: In a circle, patients were given a beach ball. Patients were to toss/pass the ball to each as if playing volleyball. Patients were timed and any time the ball came to a complete stop, the time would reset.  Education:  Leisure Scientist, Physiological, Special Educational Needs Teacher, Teamwork, Discharge Planning  Education Outcome: Acknowledges education/In group clarification offered/Needs additional education.    Affect/Mood: N/A   Participation Level: Did not attend    Clinical Observations/Individualized Feedback:      Plan: Continue to engage patient in RT group sessions 2-3x/week.   Huldah Marin-McCall, LRT,CTRS 07/11/2024 11:36 AM

## 2024-07-11 NOTE — Progress Notes (Addendum)
 Collateral contact - Harlene (Peer Support) from [place] [facility], [phone]   Peer Support informed that patient's housing application was approved, and she will pick him up on Monday, 07/14/2024 at 10 AM.   Sham Alviar, LCSWA 07/11/2024

## 2024-07-11 NOTE — Progress Notes (Signed)
(  Sleep Hours) -4 (Any PRNs that were needed, meds refused, or side effects to meds)- Trazodone  (Any disturbances and when (visitation, over night)- none reported (Concerns raised by the patient)- none reported (SI/HI/AVH)- Denies all  Remains isolative in the room. Calm and cooperative during the assessment.

## 2024-07-11 NOTE — Group Note (Signed)
 Date:  07/11/2024 Time:  6:37 PM  Group Topic/Focus: The diet's effect on mental wellness. Self Care:   The focus of this group is to help patients understand the importance of self-care in order to improve or restore emotional, physical, spiritual, interpersonal, and financial health.      Participation Level:  Did Not Attend   Juliene CHRISTELLA Huddle 07/11/2024, 6:37 PM

## 2024-07-11 NOTE — [facility] Group Notes (Signed)
 Adult Psychoeducational Group Note  Date:  07/11/2024 Time:  11:59 AM  Group Topic/Focus:  Goals Group:   The focus of this group is to help patients establish daily goals to achieve during treatment and discuss how the patient can incorporate goal setting into their daily lives to aide in recovery. Orientation:   The focus of this group is to educate the patient on the purpose and policies of crisis stabilization and provide a format to answer questions about their admission.  The group details unit policies and expectations of patients while admitted.  Participation Level:  Did Not Attend  Participation Quality:    Affect:    Cognitive:    Insight:   Engagement in Group:    Modes of Intervention:    Additional Comments:    Neyah Ellerman O 07/11/2024, 11:59 AM

## 2024-07-11 NOTE — [facility] Group Notes (Signed)
 Adult Psychoeducational Group Note  Date:  07/11/2024 Time:  12:00 PM  Group Topic/Focus: Recreation Therapy  Participation Level:  Did Not Attend  Participation Quality:    Affect:    Cognitive:    Insight:   Engagement in Group:    Modes of Intervention:    Additional Comments:    Saanya Zieske O 07/11/2024, 12:00 PM

## 2024-07-11 NOTE — Progress Notes (Signed)
 Conversation with patient:  CSW offered to send a referral for ACTT services, but patient declined (second attempt).   Celestino Ackerman, LCSW 07/11/2024

## 2024-07-11 NOTE — Plan of Care (Signed)
   Problem: Education: Goal: Emotional status will improve Outcome: Progressing Goal: Mental status will improve Outcome: Progressing

## 2024-07-11 NOTE — Progress Notes (Signed)
 [facility] MD Progress Note  07/11/2024 8:12 AM Aaron Rubio Aaron Rubio  MRN:  [id number]   Principal Problem: Schizoaffective disorder, bipolar type (HCC) Diagnosis: Principal Problem:   Schizoaffective disorder, bipolar type (HCC)  Total Time spent with patient:  I personally spent 35 minutes on the unit in direct patient care. The direct patient care time included face-to-face time with the patient, reviewing the patient's chart, communicating with other professionals, and coordinating care.   Identifying Information and psychiatric history 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    Psychiatric history is significant for prior diagnoses including Schizoaffective Disorder, Depression, Polysubstance Abuse (Meth, Cocaine, THC), and Medication Non-Compliance, 1 Suicide Attempt (wrapped towel around neck 2008), and Multiple Prior Psychiatric Hospitalizations (last- [facility] 06/2024), and no history Self Injurious Behavior.    Interval events: Patient continues to isolate in room, although did come out for physical wellness group and wrap up group with good engagement. Documented to be calm and cooperative, no behavioral concerns reported, no agitation protocol required. Only slept 4 hrs overnight. BP (!) 129/94 (BP Location: Right Arm)   Pulse 85   Temp (!) 97.5 F (36.4 C) (Oral)   Resp 20   SpO2 99%    Interview today: Today the patient reports he is pretty good. When asked about sleep, states that someone else had woken him up, notes usually was sleeping better. Discussed with him plan for discharge to the winter shelter Monday as well as changing the times of his medications to better address sleep. He expressed understanding. Did not have any concerns this morning and was agreeable to move to the  other unit. No SI, HI or AVH reported. Only other concern was to request something for dry skin.    Past Medical History:  Past Medical History:  Diagnosis Date   Hypertension    Schizophrenia (HCC)    No past surgical history on file. Family History: No family history on file. Family Psychiatric  History:  Reports No Known Diagnosis', Substance Abuse, or Suicides   Social History:  Social History   Substance and Sexual Activity  Alcohol Use Yes   Comment: occassionally     Social History   Substance and Sexual Activity  Drug Use Yes   Types: Cocaine, Marijuana   Comment: Daily; last three years    Social History   Socioeconomic History   Marital status: Married    Spouse name: Not on file   Number of children: Not on file   Years of education: Not on file   Highest education level: Not on file  Occupational History   Not on file  Tobacco Use   Smoking status: Former    Current packs/day: 0.30    Types: Cigarettes   Smokeless tobacco: Not on file  Vaping Use   Vaping status: Every Day   Substances: Nicotine , THC  Substance and Sexual Activity   Alcohol use: Yes    Comment: occassionally   Drug use: Yes    Types: Cocaine, Marijuana    Comment: Daily; last three years   Sexual activity: Yes    Comment: Reports married with kids living in a house  Other Topics Concern   Not on file  Social History Narrative   Not on file   Social Drivers of Health  Tobacco Use: Medium Risk (06/30/2024)   Patient History    Smoking Tobacco Use: Former    Smokeless Tobacco Use: Unknown    Passive Exposure: Not on Actuary Strain: Not on file  Food Insecurity: Patient Unable To Answer (07/05/2024)   Epic    Worried About Programme Researcher, Broadcasting/film/video in the Last Year: Patient unable to answer    Ran Out of Food in the Last Year: Patient unable to answer  Recent Concern: Food Insecurity - Food Insecurity Present (04/17/2024)   Epic    Worried About Patent Examiner in the Last Year: Sometimes true    Ran Out of Food in the Last Year: Sometimes true  Transportation Needs: Patient Declined (07/05/2024)   Epic    Lack of Transportation (Medical): Patient declined    Lack of Transportation (Non-Medical): Patient declined  Recent Concern: Transportation Needs - Unmet Transportation Needs (04/17/2024)   Epic    Lack of Transportation (Medical): Yes    Lack of Transportation (Non-Medical): Yes  Physical Activity: Not on file  Stress: Not on file  Social Connections: Not on file  Depression (PHQ2-9): Low Risk (04/16/2024)   Depression (PHQ2-9)    PHQ-2 Score: 4  Alcohol Screen: Low Risk (04/07/2024)   Alcohol Screen    Last Alcohol Screening Score (AUDIT): 0  Housing: Patient Declined (07/05/2024)   Epic    Unable to Pay for Housing in the Last Year: Patient declined    Number of Times Moved in the Last Year: Not on file    Homeless in the Last Year: Patient declined  Recent Concern: Housing - High Risk (04/07/2024)   Epic    Unable to Pay for Housing in the Last Year: Yes    Number of Times Moved in the Last Year: 2    Homeless in the Last Year: Yes  Utilities: Patient Declined (07/05/2024)   Epic    Threatened with loss of utilities: Patient declined  Health Literacy: Not on file     Current Medications: Current Facility-Administered Medications  Medication Dose Route Frequency Provider Last Rate Last Admin   acetaminophen  (TYLENOL ) tablet 650 mg  650 mg Oral Q6H PRN Delsie Lynwood Morene Lavone, MD       alum & mag hydroxide-simeth (MAALOX/MYLANTA) 200-200-20 MG/5ML suspension 30 mL  30 mL Oral Q4H PRN Delsie Lynwood Morene Lavone, MD       amLODipine  (NORVASC ) tablet 10 mg  10 mg Oral Daily Towana Leita SAILOR, MD   10 mg at 07/10/24 9141   haloperidol  (HALDOL ) tablet 5 mg  5 mg Oral TID PRN Delsie Lynwood Morene Lavone, MD   5 mg at 07/04/24 1028   And   diphenhydrAMINE  (BENADRYL ) capsule 50 mg  50 mg Oral TID PRN Delsie Lynwood Morene Lavone, MD   50 mg at 07/04/24 1027   haloperidol  lactate (HALDOL ) injection 5 mg  5 mg Intramuscular TID PRN Delsie Lynwood Morene Lavone, MD       And   diphenhydrAMINE  (BENADRYL ) injection 50 mg  50 mg Intramuscular TID PRN Delsie Lynwood Morene Lavone, MD       And   LORazepam  (ATIVAN ) injection 2 mg  2 mg Intramuscular TID PRN Delsie Lynwood Morene Lavone, MD       haloperidol  lactate (HALDOL ) injection 10 mg  10 mg Intramuscular TID PRN Delsie Lynwood Morene Lavone, MD       And   diphenhydrAMINE  (BENADRYL ) injection 50 mg  50 mg  Intramuscular TID PRN Delsie Lynwood Morene Lavone, MD       And   LORazepam  (ATIVAN ) injection 2 mg  2 mg Intramuscular TID PRN Delsie Lynwood Morene Lavone, MD       haloperidol  (HALDOL ) tablet 5 mg  5 mg Oral Daily Towana Leita SAILOR, MD   5 mg at 07/10/24 9141   And   haloperidol  (HALDOL ) tablet 10 mg  10 mg Oral QHS Chandy Tarman N, MD   10 mg at 07/10/24 2013   hydrOXYzine  (ATARAX ) tablet 50 mg  50 mg Oral TID PRN Delsie Lynwood Morene Lavone, MD   50 mg at 07/09/24 2134   magnesium  hydroxide (MILK OF MAGNESIA) suspension 30 mL  30 mL Oral Daily PRN Delsie Lynwood Morene Lavone, MD       OLANZapine  zydis (ZYPREXA ) disintegrating tablet 10 mg  10 mg Oral BID Kristianne Albin N, MD   10 mg at 07/10/24 1729   traZODone  (DESYREL ) tablet 50 mg  50 mg Oral QHS PRN,MR X 1 Delsie Lynwood Morene Lavone, MD   50 mg at 07/10/24 2014    Lab Results:  No results found for this or any previous visit (from the past 48 hours).   Blood Alcohol level:  Lab Results  Component Value Date   [facility] <15 06/28/2024   ETH <15 06/12/2024    Metabolic Disorder Labs: Lab Results  Component Value Date   HGBA1C 5.3 07/05/2024   MPG 105.41 07/05/2024   MPG 96.8 10/27/2023   No results found for: PROLACTIN Lab Results  Component Value Date   CHOL 103 07/04/2024   TRIG 123 07/04/2024   HDL 47 07/04/2024   CHOLHDL 2.2 07/04/2024   VLDL  25 07/04/2024   LDLCALC 32 07/04/2024   LDLCALC 66 10/19/2023    Physical Findings: AIMS:  ,  ,  ,  ,  ,  ,   CIWA:    COWS:     Mental Status exam: Appearance: black male of average BMI, tattooes on arms, appropriately groomed, seen reclining comfortably in bed  Eye contact: good  Attitude towards examiner coopearative with questions, calm and polite  Psychomotor: no agitation or retardation  Speech: normal in rate and prosody, sometimes rambling Language: no delays  Mood: pretty good Affect: restricted  Thought content: denying SI and HI, no overt delusional content expressed today  Thought Process: remains mildly disorganized - tangential, but able to answer questions linearly  Perception: denying AVH, not RTIS  Insight: fair  Judgement: fair   Orientation: x3 Attention/Concentration: fair to good - distractible but redirectable  Memory/Cognition: grossly intact on conversation   [employer] of Knowledge: Average    Musculoskeletal: Strength & Muscle Tone: within normal limits Gait & Station: normal Patient leans: N/A  Physical Exam Vitals and nursing note reviewed.  Constitutional:      General: He is not in acute distress.    Appearance: Normal appearance. He is normal weight. He is not ill-appearing or toxic-appearing.  HENT:     Head: Normocephalic and atraumatic.  Pulmonary:     Effort: Pulmonary effort is normal.  Musculoskeletal:        General: Normal range of motion.  Neurological:     General: No focal deficit present.     Mental Status: He is alert.    Review of Systems  Unable to perform ROS: Psychiatric disorder   Blood pressure (!) 129/94, pulse 85, temperature (!) 97.5 F (36.4 C), temperature source Oral, resp. rate 20, SpO2  99%. There is no height or weight on file to calculate BMI.   Treatment Plan Summary: Daily contact with patient to assess and evaluate symptoms and progress in treatment and Medication  management  Assessment/plan Grayden Burley. Barsky is a 44 yr old male with a psychiatric history most consistent with SAD-BT. He has a history of both psychotic symptoms (hallucinations, disorganization in thought and behavior) and recurrent affective symptoms including both depression (low mood, anhedonia, SI with prior suicide attempts) and mania (reduced sleep, increased energy, grandiosity, etc.). On this admission the patient presented on 12/31 to [facility] via [facility] for bizarre behavior and disorganized/tangential thoughts and speech.   Patient was initially continued on olanzapine , however due to ongoing mood labiltiy and psychotic symptoms including disorganization prior team began haldol . As of 1/6 the patient appeared calmer on interview, improved eye contact with improvements in lability, however remained tangential and with significantly reduced sleep (2-3 hrs over the last 2 nights). Haldol  to 5 mg in the morning + 10 mg at night with significantly improved sleep (>9 hrs) that evening and has continued to have more regular sleep since. He  remains isolative, irritable, and mildly tangential but overall demonstrating improvements. Patient has been noted by staff to be somewhat somnolent during the day and did again have a poor sleep (4 hrs) last evening. Will reduce and consolidate zyprexa  to 20 mg at bedtime (from 15 mg BID) to address sleep and daytime sedation. Given improvements in symptoms will plan for the patient to step down with room block.    Schizoaffective Disorder, Bipolar Type: -Continue Haldol  to 5 mg daily + 10 mg at bedtime for psychosis and mood stability  -Reduce and consolidate Zyprexa  to 20 mg at night for psychosis and mood stability    -Continue Agitation Protocol: Haldol /Ativan /Benadryl      HTN: -Continue Amlodipine  to 10 mg daily    -Continue PRN's: Tylenol , Maalox, Atarax , Milk of Magnesia, Trazodone    --  The risks/benefits/side-effects/alternatives to medications were  discussed in detail with the patient and time was given for questions. The patient consents to medication trials.                -- Metabolic profile and EKG monitoring obtained while on an atypical antipsychotic (BMI: 28.28, Lipid Panel: WNL, HbgA1c: 5.3, EKG: NSR w/ Sinus Arrhythmia w/QTc: 479)              -- Encouraged patient to participate in unit milieu and in scheduled group therapies              -- Short Term Goals: Ability to identify changes in lifestyle to reduce recurrence of condition will improve, Ability to verbalize feelings will improve, Ability to disclose and discuss suicidal ideas, Ability to demonstrate self-control will improve, Ability to identify and develop effective coping behaviors will improve, Ability to maintain clinical measurements within normal limits will improve, Compliance with prescribed medications will improve, and Ability to identify triggers associated with substance abuse/mental health issues will improve             -- Long Term Goals: Improvement in symptoms so as ready for discharge   Safety and Monitoring:             -- Involuntary admission to inpatient psychiatric unit for safety, stabilization and treatment             -- Daily contact with patient to assess and evaluate symptoms and progress in treatment             --  Patient's case to be discussed in multi-disciplinary team meeting             -- Observation Level : q15 minute checks             -- Vital signs:  q12 hours             -- Precautions: suicide, elopement, and assault  Discharge Planning:              -- Social work and case management to assist with discharge planning and identification of hospital follow-up needs prior to discharge             -- Estimated LOS: 5-7 more days             -- Discharge Concerns: Need to establish a safety plan; Medication compliance and effectiveness             -- Discharge Goals: Return home with outpatient referrals for mental health follow-up  including medication management/psychotherapy  Leita LOISE Arts, MD 07/11/2024, 8:12 AM

## 2024-07-11 NOTE — [facility] Group Notes (Signed)
 Adult Psychoeducational Group Note  Date:  07/11/2024 Time:  12:14 PM  Group Topic/Focus: Physical Wellness  Participation Level:  Did Not Attend  Participation Quality:    Affect:    Cognitive:    Insight:   Engagement in Group:    Modes of Intervention:    Additional Comments:    Aaron Rubio O 07/11/2024, 12:14 PM

## 2024-07-11 NOTE — Plan of Care (Signed)

## 2024-07-12 MED ORDER — HYDROCERIN EX CREA: TOPICAL_CREAM | Freq: Two times a day (BID) | CUTANEOUS | Status: DC | PRN

## 2024-07-12 MED ORDER — SODIUM CHLORIDE 0.9 % IN NEBU
INHALATION_SOLUTION | RESPIRATORY_TRACT | Status: AC
  Administered 2024-07-12: 5 mL
  Filled 2024-07-12: qty 3

## 2024-07-12 NOTE — Progress Notes (Signed)
" °   07/12/24 1400  Psych Admission Type (Psych Patients Only)  Admission Status Involuntary  Psychosocial Assessment  Patient Complaints None  Eye Contact Fair  Facial Expression Flat  Affect Anxious  Speech Logical/coherent  Interaction Minimal  Motor Activity Other (Comment) (wnl)  Appearance/Hygiene Unremarkable  Behavior Characteristics Cooperative  Mood Preoccupied  Thought Process  Coherency Disorganized  Content Preoccupation;Paranoia  Delusions None reported or observed  Perception WDL  Hallucination None reported or observed  Judgment Limited  Confusion None  Danger to Self  Current suicidal ideation? Denies  Danger to Others  Danger to Others None reported or observed    "

## 2024-07-12 NOTE — Group Note (Signed)
 Date:  07/12/2024 Time:  9:24 AM  Group Topic/Focus:  Goals Group:   The focus of this group is to help patients establish daily goals to achieve during treatment and discuss how the patient can incorporate goal setting into their daily lives to aide in recovery. Orientation:   The focus of this group is to educate the patient on the purpose and policies of crisis stabilization and provide a format to answer questions about their admission.  The group details unit policies and expectations of patients while admitted.    Participation Level:  Did Not Attend

## 2024-07-12 NOTE — BH Assessment (Signed)
(  Sleep Hours) - 7.25 (Any PRNs that were needed, meds refused, or side effects to meds)-  (Any disturbances and when (visitation, over night)- (Concerns raised by the patient)-  (SI/HI/AVH)- Denies Making statement; Not able to talk about it. When asked about SI/HI.

## 2024-07-12 NOTE — Plan of Care (Signed)

## 2024-07-12 NOTE — Group Note (Signed)
 Date:  07/12/2024 Time:  6:18 PM  Group Topic/Focus:  Addiction and how our neurotransmitters are affected and the effects of them are manifested. Alternatives to getting illicit dopamine hits, natural ways to do so.   Participation Level:  Did Not Attend   Juliene CHRISTELLA Huddle 07/12/2024, 6:18 PM

## 2024-07-12 NOTE — Group Note (Signed)
"                                                 [facility] LCSW Group Therapy Note    Group Date: 07/12/2024 Start Time: 1000 End Time: 1100  Type of Therapy and Topic:  Group Therapy:  Overcoming Obstacles  Participation Level:  [facility] PARTICIPATION LEVEL: Did Not Attend  Mood:  Description of Group:   In this group patients will be encouraged to explore what they see as obstacles to their own wellness and recovery. They will be guided to discuss their thoughts, feelings, and behaviors related to these obstacles. The group will process together ways to cope with barriers, with attention given to specific choices patients can make. Each patient will be challenged to identify changes they are motivated to make in order to overcome their obstacles. This group will be process-oriented, with patients participating in exploration of their own experiences as well as giving and receiving support and challenge from other group members.  Therapeutic Goals: 1. Patient will identify personal and current obstacles as they relate to admission. 2. Patient will identify barriers that currently interfere with their wellness or overcoming obstacles.  3. Patient will identify feelings, thought process and behaviors related to these barriers. 4. Patient will identify two changes they are willing to make to overcome these obstacles:    Summary of Patient Progress   NA   Therapeutic Modalities:   Cognitive Behavioral Therapy Solution Focused Therapy Motivational Interviewing Relapse Prevention Therapy   Viola Kinnick O Denasia Venn, LCSWA "

## 2024-07-12 NOTE — Progress Notes (Signed)
 [facility] MD Progress Note  07/12/2024 7:53 AM Aaron Rubio  MRN:  [id number]   Principal Problem: Schizoaffective disorder, bipolar type (HCC) Diagnosis: Principal Problem:   Schizoaffective disorder, bipolar type (HCC)  Total Time spent with patient:  I personally spent 35 minutes on the unit in direct patient care. The direct patient care time included face-to-face time with the patient, reviewing the patient's chart, communicating with other professionals, and coordinating care.   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    Psychiatric history is significant for prior diagnoses including Schizoaffective Disorder, Depression, Polysubstance Abuse (Meth, Cocaine, THC), and Medication Non-Compliance, 1 Suicide Attempt (wrapped towel around neck 2008), and Multiple Prior Psychiatric Hospitalizations (last- [facility] 06/2024), and no history Self Injurious Behavior.    Interval events: Patient stepped down to 400 hall yesterday due to improved behaviors with blocked room. Patient documented to be calm but isolative, taking prescribed medications and no agitation or aggression observed. No PRNs for behaviors required. Denying SI, hI and AVH. Slept 7.25 hrs BP (!) 129/90   Pulse 79   Temp 98.5 F (36.9 C) (Oral)   Resp 20   SpO2 100%    Interview today: Today the patient reports that he is alright. He was a bit dizzy when he got up this morning when getting out of the bed. States he is feeling less dizzy now. When asked how he is doing on the new unit, he notes I just got here, so I don't know. Encouraged to try a group today and he will consider. Otherwise denies concerns today including SI, HI, and AVH.    Past Medical History:  Past  Medical History:  Diagnosis Date   Hypertension    Schizophrenia (HCC)    No past surgical history on file. Family History: No family history on file. Family Psychiatric  History:  Reports No Known Diagnosis', Substance Abuse, or Suicides   Social History:  Social History   Substance and Sexual Activity  Alcohol Use Yes   Comment: occassionally     Social History   Substance and Sexual Activity  Drug Use Yes   Types: Cocaine, Marijuana   Comment: Daily; last three years    Social History   Socioeconomic History   Marital status: Married    Spouse name: Not on file   Number of children: Not on file   Years of education: Not on file   Highest education level: Not on file  Occupational History   Not on file  Tobacco Use   Smoking status: Former    Current packs/day: 0.30    Types: Cigarettes   Smokeless tobacco: Not on file  Vaping Use   Vaping status: Every Day   Substances: Nicotine , THC  Substance and Sexual Activity   Alcohol use: Yes    Comment: occassionally   Drug use: Yes    Types: Cocaine, Marijuana    Comment: Daily; last three years   Sexual activity: Yes    Comment: Reports married with kids living in a house  Other Topics Concern   Not on file  Social History Narrative   Not on file   Social Drivers of Health   Tobacco Use: Medium Risk (06/30/2024)   Patient History    Smoking Tobacco  Use: Former    Smokeless Tobacco Use: Unknown    Passive Exposure: Not on Actuary Strain: Not on file  Food Insecurity: Patient Unable To Answer (07/05/2024)   Epic    Worried About Programme Researcher, Broadcasting/film/video in the Last Year: Patient unable to answer    Ran Out of Food in the Last Year: Patient unable to answer  Recent Concern: Food Insecurity - Food Insecurity Present (04/17/2024)   Epic    Worried About Programme Researcher, Broadcasting/film/video in the Last Year: Sometimes true    Ran Out of Food in the Last Year: Sometimes true  Transportation Needs: Patient  Declined (07/05/2024)   Epic    Lack of Transportation (Medical): Patient declined    Lack of Transportation (Non-Medical): Patient declined  Recent Concern: Transportation Needs - Unmet Transportation Needs (04/17/2024)   Epic    Lack of Transportation (Medical): Yes    Lack of Transportation (Non-Medical): Yes  Physical Activity: Not on file  Stress: Not on file  Social Connections: Not on file  Depression (PHQ2-9): Low Risk (04/16/2024)   Depression (PHQ2-9)    PHQ-2 Score: 4  Alcohol Screen: Low Risk (04/07/2024)   Alcohol Screen    Last Alcohol Screening Score (AUDIT): 0  Housing: Patient Declined (07/05/2024)   Epic    Unable to Pay for Housing in the Last Year: Patient declined    Number of Times Moved in the Last Year: Not on file    Homeless in the Last Year: Patient declined  Recent Concern: Housing - High Risk (04/07/2024)   Epic    Unable to Pay for Housing in the Last Year: Yes    Number of Times Moved in the Last Year: 2    Homeless in the Last Year: Yes  Utilities: Patient Declined (07/05/2024)   Epic    Threatened with loss of utilities: Patient declined  Health Literacy: Not on file     Current Medications: Current Facility-Administered Medications  Medication Dose Route Frequency Provider Last Rate Last Admin   acetaminophen  (TYLENOL ) tablet 650 mg  650 mg Oral Q6H PRN Delsie Lynwood Morene Lavone, MD       alum & mag hydroxide-simeth (MAALOX/MYLANTA) 200-200-20 MG/5ML suspension 30 mL  30 mL Oral Q4H PRN Delsie Lynwood Morene Lavone, MD       amLODipine  (NORVASC ) tablet 10 mg  10 mg Oral Daily Towana Leita SAILOR, MD   10 mg at 07/11/24 9144   haloperidol  (HALDOL ) tablet 5 mg  5 mg Oral TID PRN Delsie Lynwood Morene Lavone, MD   5 mg at 07/04/24 1028   And   diphenhydrAMINE  (BENADRYL ) capsule 50 mg  50 mg Oral TID PRN Delsie Lynwood Morene Lavone, MD   50 mg at 07/04/24 1027   haloperidol  lactate (HALDOL ) injection 5 mg  5 mg Intramuscular TID PRN Delsie Lynwood Morene Lavone, MD       And   diphenhydrAMINE  (BENADRYL ) injection 50 mg  50 mg Intramuscular TID PRN Delsie Lynwood Morene Lavone, MD       And   LORazepam  (ATIVAN ) injection 2 mg  2 mg Intramuscular TID PRN Delsie Lynwood Morene Lavone, MD       haloperidol  lactate (HALDOL ) injection 10 mg  10 mg Intramuscular TID PRN Delsie Lynwood Morene Lavone, MD       And   diphenhydrAMINE  (BENADRYL ) injection 50 mg  50 mg Intramuscular TID PRN Delsie Lynwood Morene Lavone, MD  And   LORazepam  (ATIVAN ) injection 2 mg  2 mg Intramuscular TID PRN Delsie Lynwood Morene Lavone, MD       haloperidol  (HALDOL ) tablet 5 mg  5 mg Oral Daily Towana Leita SAILOR, MD   5 mg at 07/11/24 9144   And   haloperidol  (HALDOL ) tablet 10 mg  10 mg Oral QHS Towana Leita SAILOR, MD   10 mg at 07/10/24 2013   hydrOXYzine  (ATARAX ) tablet 50 mg  50 mg Oral TID PRN Delsie Lynwood Morene Lavone, MD   50 mg at 07/09/24 2134   magnesium  hydroxide (MILK OF MAGNESIA) suspension 30 mL  30 mL Oral Daily PRN Delsie Lynwood Morene Lavone, MD       OLANZapine  zydis (ZYPREXA ) disintegrating tablet 20 mg  20 mg Oral QHS Towana Leita SAILOR, MD       traZODone  (DESYREL ) tablet 50 mg  50 mg Oral QHS PRN,MR X 1 Delsie Lynwood Morene Lavone, MD   50 mg at 07/10/24 2014    Lab Results:  No results found for this or any previous visit (from the past 48 hours).   Blood Alcohol level:  Lab Results  Component Value Date   [facility] <15 06/28/2024   ETH <15 06/12/2024    Metabolic Disorder Labs: Lab Results  Component Value Date   HGBA1C 5.3 07/05/2024   MPG 105.41 07/05/2024   MPG 96.8 10/27/2023   No results found for: PROLACTIN Lab Results  Component Value Date   CHOL 103 07/04/2024   TRIG 123 07/04/2024   HDL 47 07/04/2024   CHOLHDL 2.2 07/04/2024   VLDL 25 07/04/2024   LDLCALC 32 07/04/2024   LDLCALC 66 10/19/2023    Physical Findings: AIMS:  ,  ,  ,  ,  ,  ,   CIWA:    COWS:     Mental  Status exam: Appearance: black male of average BMI, tattooes on arms, appropriately groomed, seen sitting up in bed getting vitals taken.   Eye contact: good - improved Attitude towards examiner coopearative with questions, calm and polite  Psychomotor: no agitation or retardation  Speech: normal in rate and prosody, sometimes rambling Language: no delays  Mood: alright Affect: restricted  Thought content: denying SI and HI, no overt delusional content expressed today  Thought Process: remains mildly disorganized - tangential, but able to answer questions linearly  Perception: denying AVH, not RTIS  Insight: fair  Judgement: fair   Orientation: x3 Attention/Concentration: fair to good - distractible but redirectable  Memory/Cognition: grossly intact on conversation   Fund of Knowledge: Average    Musculoskeletal: Strength & Muscle Tone: within normal limits Gait & Station: normal Patient leans: N/A  Physical Exam Vitals and nursing note reviewed.  Constitutional:      General: He is not in acute distress.    Appearance: Normal appearance. He is normal weight. He is not ill-appearing or toxic-appearing.  HENT:     Head: Normocephalic and atraumatic.  Pulmonary:     Effort: Pulmonary effort is normal.  Musculoskeletal:        General: Normal range of motion.  Neurological:     General: No focal deficit present.     Mental Status: He is alert.    Review of Systems  Unable to perform ROS: Psychiatric disorder   Blood pressure (!) 129/90, pulse 79, temperature 98.5 F (36.9 C), temperature source Oral, resp. rate 20, SpO2 100%. There is no height or weight on file to calculate BMI.  Treatment Plan Summary: Daily contact with patient to assess and evaluate symptoms and progress in treatment and Medication management  Assessment/plan Aaron Rubio is a 44 yr old male with a psychiatric history most consistent with SAD-BT. He has a history of both psychotic symptoms  (hallucinations, disorganization in thought and behavior) and recurrent affective symptoms including both depression (low mood, anhedonia, SI with prior suicide attempts) and mania (reduced sleep, increased energy, grandiosity, etc.). On this admission the patient presented on 12/31 to [facility] via [facility] for bizarre behavior and disorganized/tangential thoughts and speech.   Patient was initially continued on olanzapine , however due to ongoing mood labiltiy and psychotic symptoms including disorganization prior team began haldol . As of 1/6 the patient appeared calmer on interview, improved eye contact with improvements in lability, however remained tangential and with significantly reduced sleep (2-3 hrs on average). He was started on Haldol , increased to to 5 mg in the morning + 10 mg at night with significantly improved sleep (>9 hrs) that evening and has continued to have more regular sleep since. He  remains isolative, irritable, and mildly tangential but overall demonstrating improvements. Due to reports of daytime somnolence and to reduce polypharmacy, as of 1/9 zyprexa  was reduced and consolidated to 20 mg at bedtime (from 15 mg BID).  Yesterday (1/9) stepped down to 400 hall due to ongoing good behaviors. Today he continues to present with mild tangentiality that has not significantly changed but remains calm and polite on interview, denying all concerns aside from dizziness on first waking, improved with time. Likely nearing baseline.   Schizoaffective Disorder, Bipolar Type: -Continue Haldol  to 5 mg daily + 10 mg at bedtime for psychosis and mood stability  -Continue Zyprexa  20 mg at night for psychosis and mood stability    -Continue Agitation Protocol: Haldol /Ativan /Benadryl      HTN: -Continue Amlodipine  to 10 mg daily  -BID PRN Eucerin cream for dry skin on patient request   -Continue PRN's: Tylenol , Maalox, Atarax , Milk of Magnesia, Trazodone    --  The  risks/benefits/side-effects/alternatives to medications were discussed in detail with the patient and time was given for questions. The patient consents to medication trials.                -- Metabolic profile and EKG monitoring obtained while on an atypical antipsychotic (BMI: 28.28, Lipid Panel: WNL, HbgA1c: 5.3, EKG: NSR w/ Sinus Arrhythmia w/QTc: 479)              -- Encouraged patient to participate in unit milieu and in scheduled group therapies              -- Short Term Goals: Ability to identify changes in lifestyle to reduce recurrence of condition will improve, Ability to verbalize feelings will improve, Ability to disclose and discuss suicidal ideas, Ability to demonstrate self-control will improve, Ability to identify and develop effective coping behaviors will improve, Ability to maintain clinical measurements within normal limits will improve, Compliance with prescribed medications will improve, and Ability to identify triggers associated with substance abuse/mental health issues will improve             -- Long Term Goals: Improvement in symptoms so as ready for discharge   Safety and Monitoring:             -- Involuntary admission to inpatient psychiatric unit for safety, stabilization and treatment             -- Daily contact with patient to assess and evaluate  symptoms and progress in treatment             -- Patient's case to be discussed in multi-disciplinary team meeting             -- Observation Level : q15 minute checks             -- Vital signs:  q12 hours             -- Precautions: suicide, elopement, and assault  Discharge Planning:              -- Social work and case management to assist with discharge planning and identification of hospital follow-up needs prior to discharge             -- Estimated LOS: 3-4 more days             -- Discharge Concerns: Need to establish a safety plan; Medication compliance and effectiveness             -- Discharge Goals:  Return home with outpatient referrals for mental health follow-up including medication management/psychotherapy  Leita LOISE Arts, MD 07/12/2024, 7:53 AM

## 2024-07-12 NOTE — Group Note (Signed)
 Date:  07/12/2024 Time:  8:57 PM  Group Topic/Focus:  Wrap-Up Group:   The focus of this group is to help patients review their daily goal of treatment and discuss progress on daily workbooks.    Participation Level:  None  Participation Quality:  Resistant  Affect:  Flat  Cognitive:  Lacking  Insight: Lacking  Engagement in Group:  None  Modes of Intervention:  Discussion  Additional Comments:  Patient was present during wrap up group, but did not participate.  Aaron Rubio 07/12/2024, 8:57 PM

## 2024-07-12 NOTE — Plan of Care (Signed)

## 2024-07-13 NOTE — Progress Notes (Signed)
 D:  Patient's self inventory sheet, patient denied SI and HI, contracts for safety.  Denied A/V hallucinations.  Denied pain. A:  Medications administered per MD orders.  Emotional support and encouragement given patient. R:  Safety maintained with 15 minute checks.  Patient stated he slept well last night.

## 2024-07-13 NOTE — Plan of Care (Signed)
   Problem: Education: Goal: Knowledge of [facility] Education information/materials will improve Outcome: Progressing   Problem: Activity: Goal: Interest or engagement in activities will improve Outcome: Progressing   Problem: Coping: Goal: Ability to verbalize frustrations and anger appropriately will improve Outcome: Progressing   Problem: Safety: Goal: Periods of time without injury will increase Outcome: Progressing

## 2024-07-13 NOTE — Group Note (Signed)
 Date:  07/13/2024 Time:  7:29 PM  Group Topic/Focus:  Healthy Relationships - Description of what a healthy relationship looks like and how each person needs to be active in the relationship.     Participation Level:  Did Not Attend   Aaron Rubio 07/13/2024, 7:29 PM

## 2024-07-13 NOTE — [facility] Group Notes (Signed)
 [facility] Group Notes:  (Nursing/MHT/Case Management/Adjunct)  Date:  07/13/2024  Time:  10:49 PM  Type of Therapy:  Psychoeducational Skills  Participation Level:  Did Not Attend  Participation Quality:  Did not attend  Affect:  Did not attend   Cognitive:  Did not attend   Insight:  None  Engagement in Group:  Did not attend   Modes of Intervention:  did not attend   Summary of Progress/Problems: Patient did not attend group.   Aaron Rubio 07/13/2024, 10:49 PM

## 2024-07-13 NOTE — Progress Notes (Signed)
(  Sleep Hours) -6.5 (Any PRNs that were needed, meds refused, or side effects to meds)-  (Any disturbances and when (visitation, over night)- N/A (Concerns raised by the patient)- none (SI/HI/AVH)- Denies

## 2024-07-13 NOTE — Group Note (Signed)
 Date:  07/13/2024 Time:  6:24 PM  Group Topic/Focus:  Physical Wellness:   This group focuses on introducing patients to Progressive Muscle Relaxation, a technique that involves tensing and then slowly releasing different muscle groups in the body. The goal is to help individuals become more aware of physical tension and stress in their body, and to learn how to consciously release it .   Participation Level:  Did Not Attend  Aaron Rubio 07/13/2024, 6:24 PM

## 2024-07-13 NOTE — Group Note (Signed)
 Date:  07/13/2024 Time:  7:21 PM  Group Topic/Focus:   Emotional Education:   The focus of this group is to increase self-awareness, emotional expression, autonomy, and self esteem through discussion on control and a lyric substitution activity.   Participation Level:  Did Not Attend   Aaron Rubio Mars 07/13/2024, 7:21 PM

## 2024-07-13 NOTE — Group Note (Signed)
 Date:  07/13/2024 Time:  6:18 PM  Group Topic/Focus:  Goals Group:   The focus of this group is to help patients establish daily goals to achieve during treatment and discuss how the patient can incorporate goal setting into their daily lives to aide in recovery. Orientation:   The focus of this group is to educate the patient on the purpose and policies of crisis stabilization and provide a format to answer questions about their admission.  The group details unit policies and expectations of patients while admitted.    Participation Level:  Did Not Attend   Aaron Rubio 07/13/2024, 6:18 PM

## 2024-07-13 NOTE — Progress Notes (Signed)
 [facility] MD Progress Note  07/13/2024 7:45 AM Aaron Rubio  MRN:  [id number]   Principal Problem: Schizoaffective disorder, bipolar type (HCC) Diagnosis: Principal Problem:   Schizoaffective disorder, bipolar type (HCC)  Total Time spent with patient:  I personally spent 35 minutes on the unit in direct patient care. The direct patient care time included face-to-face time with the patient, reviewing the patient's chart, communicating with other professionals, and coordinating care.   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    Psychiatric history is significant for prior diagnoses including Schizoaffective Disorder, Depression, Polysubstance Abuse (Meth, Cocaine, THC), and Medication Non-Compliance, 1 Suicide Attempt (wrapped towel around neck 2008), and Multiple Prior Psychiatric Hospitalizations (last- [facility] 06/2024), and no history Self Injurious Behavior.    Interval events: Patient documented to be cooperative and logical, but minimally engaged, not attending groups although did attend wrap up group. [place] still present, otherwise appropriate in interactions. Denying SI, HI and AVH. Medication compliant. Slept 6.5 hrs overnight. BP (!) 125/93 (BP Location: Right Arm)   Pulse 75   Temp 97.9 F (36.6 C) (Oral)   Resp 20   SpO2 98%    Interview today: Today the patient reports that he is fine, just trying to rest. States he slept okay last night but was up and down a bit. Denies additional concerns including SI, HI and AVH. Reports no side effects to his medications. States he has had no further episodes of dizziness and feels fine. Feels he will be ready to leave the hospital tomorrow. No additional questions or concerns.     Past Medical History:  Past Medical History:  Diagnosis Date   Hypertension    Schizophrenia (HCC)    No past surgical history on file. Family History: No family history on file. Family Psychiatric  History:  Reports No Known Diagnosis', Substance Abuse, or Suicides   Social History:  Social History   Substance and Sexual Activity  Alcohol Use Yes   Comment: occassionally     Social History   Substance and Sexual Activity  Drug Use Yes   Types: Cocaine, Marijuana   Comment: Daily; last three years    Social History   Socioeconomic History   Marital status: Married    Spouse name: Not on file   Number of children: Not on file   Years of education: Not on file   Highest education level: Not on file  Occupational History   Not on file  Tobacco Use   Smoking status: Former    Current packs/day: 0.30    Types: Cigarettes   Smokeless tobacco: Not on file  Vaping Use   Vaping status: Every Day   Substances: Nicotine , THC  Substance and Sexual Activity   Alcohol use: Yes    Comment: occassionally   Drug use: Yes    Types: Cocaine, Marijuana    Comment: Daily; last three years   Sexual activity: Yes    Comment: Reports married with kids living in a house  Other Topics Concern   Not on file  Social History Narrative   Not on file   Social Drivers of Health   Tobacco Use: Medium Risk (06/30/2024)   Patient History    Smoking Tobacco Use: Former    Smokeless  Tobacco Use: Unknown    Passive Exposure: Not on file  Financial Resource Strain: Not on file  Food Insecurity: Patient Unable To Answer (07/05/2024)   Epic    Worried About Programme Researcher, Broadcasting/film/video in the Last Year: Patient unable to answer    Ran Out of Food in the Last Year: Patient unable to answer  Recent Concern: Food Insecurity - Food Insecurity Present (04/17/2024)   Epic    Worried About Programme Researcher, Broadcasting/film/video in the Last Year: Sometimes true    Ran Out of Food in the Last Year: Sometimes true   Transportation Needs: Patient Declined (07/05/2024)   Epic    Lack of Transportation (Medical): Patient declined    Lack of Transportation (Non-Medical): Patient declined  Recent Concern: Transportation Needs - Unmet Transportation Needs (04/17/2024)   Epic    Lack of Transportation (Medical): Yes    Lack of Transportation (Non-Medical): Yes  Physical Activity: Not on file  Stress: Not on file  Social Connections: Not on file  Depression (PHQ2-9): Low Risk (04/16/2024)   Depression (PHQ2-9)    PHQ-2 Score: 4  Alcohol Screen: Low Risk (04/07/2024)   Alcohol Screen    Last Alcohol Screening Score (AUDIT): 0  Housing: Patient Declined (07/05/2024)   Epic    Unable to Pay for Housing in the Last Year: Patient declined    Number of Times Moved in the Last Year: Not on file    Homeless in the Last Year: Patient declined  Recent Concern: Housing - High Risk (04/07/2024)   Epic    Unable to Pay for Housing in the Last Year: Yes    Number of Times Moved in the Last Year: 2    Homeless in the Last Year: Yes  Utilities: Patient Declined (07/05/2024)   Epic    Threatened with loss of utilities: Patient declined  Health Literacy: Not on file     Current Medications: Current Facility-Administered Medications  Medication Dose Route Frequency Provider Last Rate Last Admin   acetaminophen  (TYLENOL ) tablet 650 mg  650 mg Oral Q6H PRN Delsie Lynwood Morene Lavone, MD       alum & mag hydroxide-simeth (MAALOX/MYLANTA) 200-200-20 MG/5ML suspension 30 mL  30 mL Oral Q4H PRN Delsie Lynwood Morene Lavone, MD       amLODipine  (NORVASC ) tablet 10 mg  10 mg Oral Daily Towana Leita SAILOR, MD   10 mg at 07/12/24 9052   haloperidol  (HALDOL ) tablet 5 mg  5 mg Oral TID PRN Delsie Lynwood Morene Lavone, MD   5 mg at 07/04/24 1028   And   diphenhydrAMINE  (BENADRYL ) capsule 50 mg  50 mg Oral TID PRN Delsie Lynwood Morene Lavone, MD   50 mg at 07/04/24 1027   haloperidol  lactate (HALDOL ) injection 5 mg  5  mg Intramuscular TID PRN Delsie Lynwood Morene Lavone, MD       And   diphenhydrAMINE  (BENADRYL ) injection 50 mg  50 mg Intramuscular TID PRN Delsie Lynwood Morene Lavone, MD       And   LORazepam  (ATIVAN ) injection 2 mg  2 mg Intramuscular TID PRN Delsie Lynwood Morene Lavone, MD       haloperidol  lactate (HALDOL ) injection 10 mg  10 mg Intramuscular TID PRN Delsie Lynwood Morene Lavone, MD       And   diphenhydrAMINE  (BENADRYL ) injection 50 mg  50 mg Intramuscular TID PRN Delsie Lynwood Morene Lavone, MD       And   LORazepam  (ATIVAN ) injection  2 mg  2 mg Intramuscular TID PRN Delsie Lynwood Morene Lavone, MD       haloperidol  (HALDOL ) tablet 5 mg  5 mg Oral Daily Towana Leita SAILOR, MD   5 mg at 07/12/24 9052   And   haloperidol  (HALDOL ) tablet 10 mg  10 mg Oral QHS Towana Leita SAILOR, MD   10 mg at 07/12/24 2136   hydrocerin (EUCERIN) cream   Topical BID PRN Dmauri Rosenow N, MD       hydrOXYzine  (ATARAX ) tablet 50 mg  50 mg Oral TID PRN Delsie Lynwood Morene Lavone, MD   50 mg at 07/12/24 1743   magnesium  hydroxide (MILK OF MAGNESIA) suspension 30 mL  30 mL Oral Daily PRN Delsie Lynwood Morene Lavone, MD       OLANZapine  zydis (ZYPREXA ) disintegrating tablet 20 mg  20 mg Oral QHS Towana Leita SAILOR, MD   20 mg at 07/12/24 2136   traZODone  (DESYREL ) tablet 50 mg  50 mg Oral QHS PRN,MR X 1 Delsie Lynwood Morene Lavone, MD   50 mg at 07/12/24 2137    Lab Results:  No results found for this or any previous visit (from the past 48 hours).   Blood Alcohol level:  Lab Results  Component Value Date   [facility] <15 06/28/2024   ETH <15 06/12/2024    Metabolic Disorder Labs: Lab Results  Component Value Date   HGBA1C 5.3 07/05/2024   MPG 105.41 07/05/2024   MPG 96.8 10/27/2023   No results found for: PROLACTIN Lab Results  Component Value Date   CHOL 103 07/04/2024   TRIG 123 07/04/2024   HDL 47 07/04/2024   CHOLHDL 2.2 07/04/2024   VLDL 25 07/04/2024   LDLCALC  32 07/04/2024   LDLCALC 66 10/19/2023    Physical Findings: AIMS:  ,  ,  ,  ,  ,  ,   CIWA:    COWS:     Mental Status exam: Appearance: black male of average BMI, tattooes on arms, appropriately groomed, seen resting in bed with eyes closed, does open for interview  Eye contact: good Attitude towards examiner coopearative with questions, calm and polite  Psychomotor: no agitation or retardation  Speech: normal in rate and prosody, sometimes rambling Language: no delays  Mood: fine - just trying to rest Affect: restricted  Thought content: denying SI and HI, no overt delusional content expressed today  Thought Process: largely linear/organized  Perception: denying AVH, not RTIS  Insight: fair  Judgement: fair   Orientation: x3 Attention/Concentration: fair to good - attends to interview Memory/Cognition: grossly intact on conversation   [employer] of Knowledge: Average    Musculoskeletal: Strength & Muscle Tone: within normal limits Gait & Station: normal Patient leans: N/A  Physical Exam Vitals and nursing note reviewed.  Constitutional:      General: He is not in acute distress.    Appearance: Normal appearance. He is normal weight. He is not ill-appearing or toxic-appearing.  HENT:     Head: Normocephalic and atraumatic.  Pulmonary:     Effort: Pulmonary effort is normal.  Musculoskeletal:        General: Normal range of motion.  Neurological:     General: No focal deficit present.     Mental Status: He is alert.    Review of Systems  Unable to perform ROS: Psychiatric disorder   Blood pressure (!) 125/93, pulse 75, temperature 97.9 F (36.6 C), temperature source Oral, resp. rate 20, SpO2 98%. There is no  height or weight on file to calculate BMI.   Treatment Plan Summary: Daily contact with patient to assess and evaluate symptoms and progress in treatment and Medication management  Assessment/plan Deavon Podgorski. Holster is a 44 yr old male with a psychiatric  history most consistent with SAD-BT. He has a history of both psychotic symptoms (hallucinations, disorganization in thought and behavior) and recurrent affective symptoms including both depression (low mood, anhedonia, SI with prior suicide attempts) and mania (reduced sleep, increased energy, grandiosity, etc.). On this admission the patient presented on 12/31 to [facility] via [facility] for bizarre behavior and disorganized/tangential thoughts and speech.   Patient was initially continued on olanzapine , however due to ongoing mood labiltiy and psychotic symptoms including disorganization prior team began haldol . As of 1/6 the patient appeared calmer on interview, improved eye contact with improvements in lability, however remained tangential and with significantly reduced sleep (2-3 hrs on average). He was started on Haldol , increased to to 5 mg in the morning + 10 mg at night with significantly improved sleep (>9 hrs) that evening and has continued to have more regular sleep since. He  remains isolative, irritable, and mildly tangential but overall demonstrating improvements. Due to reports of daytime somnolence and to reduce polypharmacy, as of 1/9 zyprexa  was reduced and consolidated to 20 mg at bedtime (from 15 mg BID).  Yesterday (1/9) stepped down to 400 hall due to ongoing good behaviors. Over the last few days he continues to present with mild tangentiality that has not significantly changed but remains calm and polite on interview, denying all concerns. Likely at baseline. Plan at this time is discharge tomorrow with peer support specialist [place] [facility].  Schizoaffective Disorder, Bipolar Type: -Continue Haldol  to 5 mg daily + 10 mg at bedtime for psychosis and mood stability  -Continue Zyprexa  20 mg at night for psychosis and mood stability    -Continue Agitation Protocol: Haldol /Ativan /Benadryl      HTN: -Continue Amlodipine  to 10 mg daily  -BID PRN Eucerin cream for dry skin on patient  request   -Continue PRN's: Tylenol , Maalox, Atarax , Milk of Magnesia, Trazodone    --  The risks/benefits/side-effects/alternatives to medications were discussed in detail with the patient and time was given for questions. The patient consents to medication trials.                -- Metabolic profile and EKG monitoring obtained while on an atypical antipsychotic (BMI: 28.28, Lipid Panel: WNL, HbgA1c: 5.3, EKG: NSR w/ Sinus Arrhythmia w/QTc: 479)              -- Encouraged patient to participate in unit milieu and in scheduled group therapies              -- Short Term Goals: Ability to identify changes in lifestyle to reduce recurrence of condition will improve, Ability to verbalize feelings will improve, Ability to disclose and discuss suicidal ideas, Ability to demonstrate self-control will improve, Ability to identify and develop effective coping behaviors will improve, Ability to maintain clinical measurements within normal limits will improve, Compliance with prescribed medications will improve, and Ability to identify triggers associated with substance abuse/mental health issues will improve             -- Long Term Goals: Improvement in symptoms so as ready for discharge   Safety and Monitoring:             -- Involuntary admission to inpatient psychiatric unit for safety, stabilization and treatment             --  Daily contact with patient to assess and evaluate symptoms and progress in treatment             -- Patient's case to be discussed in multi-disciplinary team meeting             -- Observation Level : q15 minute checks             -- Vital signs:  q12 hours             -- Precautions: suicide, elopement, and assault  Discharge Planning:              -- Social work and case management to assist with discharge planning and identification of hospital follow-up needs prior to discharge             -- Estimated LOS: 3-4 more days             -- Discharge Concerns: Need to  establish a safety plan; Medication compliance and effectiveness             -- Discharge Goals: Return home with outpatient referrals for mental health follow-up including medication management/psychotherapy  Leita LOISE Arts, MD 07/13/2024, 7:45 AM

## 2024-07-13 NOTE — Plan of Care (Signed)
 Nurse discussed anxiety, depression and coping skills with patient.

## 2024-07-14 MED ORDER — HALOPERIDOL 5 MG PO TABS: ORAL_TABLET | ORAL | 0 refills | Status: DC

## 2024-07-14 MED ORDER — AMLODIPINE BESYLATE 10 MG PO TABS: 10.0000 mg | ORAL_TABLET | Freq: Every day | ORAL | 0 refills | Status: DC

## 2024-07-14 MED ORDER — OLANZAPINE 20 MG PO TBDP: 20.0000 mg | ORAL_TABLET | Freq: Every day | ORAL | 0 refills | Status: DC

## 2024-07-14 NOTE — Progress Notes (Signed)
(  Sleep Hours) -7.75 (Any PRNs that were needed, meds refused, or side effects to meds)- Trazodone  (Any disturbances and when (visitation, over night)- n/a (Concerns raised by the patient)- none (SI/HI/AVH)- Denies

## 2024-07-14 NOTE — Progress Notes (Addendum)
 Patient was given return to work note.  Patient was given  - a list of shelter resources  - a list of food banks - 2 bus passes - a list of dentists that accept Medicaid in [facility], LCSWA 07/14/2024

## 2024-07-14 NOTE — [facility]HH Suicide Risk Assessment (Signed)
 [facility] Discharge Suicide Risk Assessment   Principal Problem: Schizoaffective disorder, bipolar type [facility]) Discharge Diagnoses: Principal Problem:   Schizoaffective disorder, bipolar type (HCC)   Suicide Risk:  The patient presented with acute risk factors for suicide including AMS/psychosis for which he was admitted to [facility] and restarted on medications. He carries additional chronic risk factors of male gender, substance use prior to admission, one prior suicide attempt (2008 wrapped towel around neck), history of mental illness and prior psychiatric admissions. Mitigating factors include denial of depressive symptoms and SI throughout entirety of this admission. Benefit to current medications, sobriety, community support (through peer support specialist), [place] will [place] domic[place] [facility]. Today continues to deny SI and presents as generally euthymic, expressing clear future orientation. At this time, current and short-term risk of suicide is considered low.    Follow-up Information     [facility] Follow up on 07/18/2024.   Why: You have a hospital follow up appointment for therapy and medication management services on  07/18/24 at 9:00 am .  The appointment will be Virtual, telehealth. Contact information: [address]  [address] [place] [address] [phone]         [facility] (Disability Advocacy) Follow up.   Why: A referral was sent on 07/08/2024. Please contact the provider on 07/16/2023 at 9 AM for an update on the status of the referral. Contact information: [address] [place], [place] [address] [phone]                 Leita LOISE Arts, MD 07/14/2024, 7:51 AM

## 2024-07-14 NOTE — Progress Notes (Signed)
" °   07/14/24 0800  Psych Admission Type (Psych Patients Only)  Admission Status Involuntary  Psychosocial Assessment  Patient Complaints Isolation  Eye Contact Fair  Facial Expression Flat  Affect Depressed;Preoccupied  Speech Logical/coherent  Interaction Cautious  Motor Activity Slow  Appearance/Hygiene Unremarkable  Behavior Characteristics Cooperative;Appropriate to situation  Mood Depressed  Thought Process  Coherency WDL  Content WDL  Delusions None reported or observed  Perception WDL  Hallucination None reported or observed  Judgment Poor  Confusion None  Danger to Self  Current suicidal ideation? Denies  Agreement Not to Harm Self Yes  Description of Agreement verbal  Danger to Others  Danger to Others None reported or observed    "

## 2024-07-14 NOTE — Discharge Summary (Signed)
 " Physician Discharge Summary Note  Patient:  Aaron Rubio is an 44 y.o., male MRN:  [id number] DOB:  [birth year] Patient phone:  [phone] (home)  Patient address:   [address] [place] [address],   Total Time spent with patient:  I personally spent 35 minutes on the unit in direct patient care. The direct patient care time included face-to-face time with the patient, reviewing the patient's chart, communicating with other professionals, and coordinating care.   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   Psychiatric history is significant for prior diagnoses including Schizoaffective Disorder, Depression, Polysubstance Abuse (Meth, Cocaine, THC), and Medication Non-Compliance, 1 Suicide Attempt (wrapped towel around neck 2008), and Multiple Prior Psychiatric Hospitalizations (last- [facility] 06/2024), and no history Self Injurious Behavior.   Hospital course: The patient presented on 12/31 to [facility] via [facility] for bizarre behavior and disorganized/tangential thoughts and speech, he was admitted to [facility] on 1/2.   Upon arrival, the patient was noted to be so disorganized in his speech and thought process with significant tangentiality and mood lability. He was expressing delusions and full screening was unable to be done due to these florid psychotic symptoms. Patient was started on zyprexa , initially increased to 30 mg TDD. However due to ongoing mood lability, disorganization in thoughts and frequently requiring  agitation protocol haldol  was initiated. Patient began to demonstrate some improvements with the haldol  added, calmer and more linear. Plan was to attempt to fully cross-taper from zyprexa  to haldol , however patient was largely resistant to this and specifically requested monotherapy with zyprexa . Over the next week dosages were adjusted to address mood and psychotic symptoms as well as intermittent poor sleep. Ultimately discharged on haldol  5 mg in the morning + 10 at night and zyprexa  20 mg at night.   As of 1/9 the patient was stepped down to 400 hall due to ongoing good behaviors and no agitation protocol in several days. Over the 3 days of admission he continued to present with mild tangentiality that had improved from admission. He remained isolative to his room with concern for mild paranoia, however was calm and polite on interview, denying all concerns and interacting appropriately with staff and peers. Patient was deemed likely at baseline and plan was to discharge the following [facility] to [facility] coordinated by his peer support specialist.   Interview today: Today the patient reports he is good. He slept on and off, which he notes has always been the case for him. Overall feels sleep is better now than before he came in. He is feeling ready to leave the hospital and stats I can come back if I need you all, right? Notified patient that if he is in crisis he can return to the ED and be admitted in the future. Voiced understanding. Denying SI, HI and AVH today. Reporting he has felt safe on the unit but doesn't like to be around people and will continue to keep to himself. Has no concerns about discharging to the winter shelter tonight. All questions answered.    Behavior on unit/overall:  The patient initially presented with florid psychotic symptoms, frequently requiring redirection and agitation protocol as documented above. Psychotic symptoms were severe even at maximum recommended  dose of zyprexa , and ultimately he was placed on dual antipsychotic therapy as noted above and below with recommendation to consider monotherapy with haldol , as this appeared to be more beneficial for him. He had progressive improvements after initiation of haldol  with no agitation protocol required for at least 5 days prior to discharge date and improvements in thought process, eye contact, mood lability, sleep, and resolution of AVH. Patient remained largely isolative throughout the entirety of admission - typically did not attend groups and kept to himself, however did demonstrate improvements in interactions with staff and peers. Over the last 7 days patient consistently denied SI, HI and AVH. By day of discharge he continued to present with restricted affect and very mild tangentiality but overall was considered at baseline. Discharged in stable and improved condition into the care of his peer support specialist, Harlene, who planned to pick up medications at nearby pharmacy and then bring the patient to the winter shelter he was accepted to. Follow up scheduled as noted below and patient was encourage to call 911, [phone] or present to nearest ED if in mental health crisis in the future.    Past Medical History:  Past Medical History:  Diagnosis Date   Hypertension    Schizophrenia (HCC)    No past surgical history on file. Family History: No family history on file.  Social History:  Social History   Substance and Sexual Activity  Alcohol Use Yes   Comment: occassionally     Social History   Substance and Sexual Activity  Drug Use Yes   Types: Cocaine, Marijuana   Comment: Daily; last three years    Social History   Socioeconomic History   Marital status: Married    Spouse name: Not on file   Number of children: Not on file   Years of education: Not on file   Highest education level: Not on file  Occupational History   Not on file  Tobacco Use   Smoking status: Former     Current packs/day: 0.30    Types: Cigarettes   Smokeless tobacco: Not on file  Vaping Use   Vaping status: Every Day   Substances: Nicotine , THC  Substance and Sexual Activity   Alcohol use: Yes    Comment: occassionally   Drug use: Yes    Types: Cocaine, Marijuana    Comment: Daily; last three years   Sexual activity: Yes    Comment: Reports married with kids living in a house  Other Topics Concern   Not on file  Social History Narrative   Not on file   Social Drivers of Health   Tobacco Use: Medium Risk (06/30/2024)   Patient History    Smoking Tobacco Use: Former    Smokeless Tobacco Use: Unknown    Passive Exposure: Not on Actuary Strain: Not on file  Food Insecurity: Patient Unable To Answer (07/05/2024)   Epic    Worried About Programme Researcher, Broadcasting/film/video in the Last Year: Patient unable to answer    Ran Out of Food in the Last Year: Patient unable to answer  Recent Concern: Food Insecurity - Food Insecurity Present (04/17/2024)   Epic    Worried About Programme Researcher, Broadcasting/film/video in the Last Year: Sometimes true    [employer] of Food in the Last Year: Sometimes true  Transportation Needs: Patient Declined (07/05/2024)   Epic    Lack of Transportation (Medical): Patient declined    Lack of Transportation (Non-Medical): Patient declined  Recent Concern: Transportation Needs - Unmet Transportation Needs (04/17/2024)   Epic    Lack of Transportation (Medical): Yes    Lack of Transportation (Non-Medical): Yes  Physical Activity: Not on file  Stress: Not on file  Social Connections: Not on file  Depression (PHQ2-9): Low Risk (04/16/2024)   Depression (PHQ2-9)    PHQ-2 Score: 4  Alcohol Screen: Low Risk (04/07/2024)   Alcohol Screen    Last Alcohol Screening Score (AUDIT): 0  Housing: Patient Declined (07/05/2024)   Epic    Unable to Pay for Housing in the Last Year: Patient declined    Number of Times Moved in the Last Year: Not on file    Homeless in the Last Year:  Patient declined  Recent Concern: Housing - High Risk (04/07/2024)   Epic    Unable to Pay for Housing in the Last Year: Yes    Number of Times Moved in the Last Year: 2    Homeless in the Last Year: Yes  Utilities: Patient Declined (07/05/2024)   Epic    Threatened with loss of utilities: Patient declined  Health Literacy: Not on file     Physical Findings:  Mental Status exam: Appearance: black male of average BMI, tattooes on arms, appropriately groomed, seen reclining comfortably in bed. Sits up for interview  Eye contact: good Attitude towards examiner coopearative with questions, calm and polite  Psychomotor: no agitation or retardation  Speech: normal in rate and prosody, sometimes rambling Language: no delays  Mood: good Affect: restricted  Thought content: denying SI and HI, no overt delusional content expressed today  Thought Process: largely linear/organized, less tangential than prior days  Perception: denying AVH, not RTIS  Insight: fair  Judgement: fair    Orientation: x3 Attention/Concentration: fair to good - attends to interview Memory/Cognition: grossly intact on conversation   Fund of Knowledge: Average      Musculoskeletal: Strength & Muscle Tone: within normal limits Gait & Station: normal Patient leans: N/A   Physical Exam: Physical Exam Constitutional:      Appearance: Normal appearance.  HENT:     Head: Normocephalic and atraumatic.  Pulmonary:     Effort: Pulmonary effort is normal.  Abdominal:     General: There is no distension.  Musculoskeletal:        General: Normal range of motion.  Neurological:     General: No focal deficit present.     Mental Status: He is alert.    ROS Blood pressure 104/71, pulse 87, temperature 97.9 F (36.6 C), temperature source Oral, resp. rate 20, SpO2 100%. There is no height or weight on file to calculate BMI.   Tobacco Use History[1] Tobacco Cessation:  N/A, patient does not currently use  tobacco products   Blood Alcohol level:  Lab Results  Component Value Date   [facility] <15 06/28/2024   ETH <15 06/12/2024    Metabolic Disorder Labs:  Lab Results  Component Value Date   HGBA1C 5.3 07/05/2024   MPG 105.41 07/05/2024   MPG 96.8 10/27/2023   No results found for: PROLACTIN Lab Results  Component Value Date   CHOL 103 07/04/2024   TRIG 123 07/04/2024   HDL 47 07/04/2024   CHOLHDL 2.2 07/04/2024   VLDL 25 07/04/2024   LDLCALC 32 07/04/2024   LDLCALC 66 10/19/2023  See Psychiatric Specialty Exam and Suicide Risk Assessment completed by Attending Physician prior to discharge.  Discharge destination:  To [facility] via Peer Support Specialist, Harlene  Is patient on multiple antipsychotic therapies at discharge:  Yes,   Do you recommend tapering to monotherapy for antipsychotics?  Yes   Has Patient had three or more failed trials of antipsychotic monotherapy by history:  No - failed monotherapy with olanzapine  and haldol    Recommended Plan for Multiple Antipsychotic Therapies: Taper to monotherapy as described:  recommend tapering off zyprexa  and increasing haldol  as monotherapy. Monotherapy with zyprexa  was not effective. Patient was resistant to this - voicing preference for zyprexa  and during this admission and so compromise was dual antipsychotic therapy wither lower dose zyprexa  + haldol .    Allergies as of 07/14/2024   No Known Allergies      Medication List     TAKE these medications      Indication  amLODipine  10 MG tablet Commonly known as: NORVASC  Take 1 tablet (10 mg total) by mouth daily.  Indication: High Blood Pressure   haloperidol  5 MG tablet Commonly known as: HALDOL  Take 1 tablet (5 mg total) by mouth daily AND 2 tablets (10 mg total) at bedtime.  Indication: Psychosis   OLANZapine  zydis 20 MG disintegrating tablet Commonly known as: ZYPREXA  Take 1 tablet (20 mg total) by mouth at bedtime.  Indication: Schizophrenia         Follow-up Information     [facility] Follow up on 07/18/2024.   Why: You have a hospital follow up appointment for therapy and medication management services on  07/18/24 at 9:00 am .  The appointment will be Virtual, telehealth. Contact information: [address]  [address] [place] [address] [phone]         [facility] (Disability Advocacy) Follow up.   Why: A referral was sent on 07/08/2024. Please contact the provider on 07/16/2023 at 9 AM for an update on the status of the referral. Contact information: [address] [place], [place] [address] [phone]                 Signed: Leita LOISE Arts, MD 07/14/2024, 8:19 AM       [1]  Social History Tobacco Use  Smoking Status Former   Current packs/day: 0.30   Types: Cigarettes  Smokeless Tobacco Not on file   "

## 2024-07-14 NOTE — Progress Notes (Signed)
" °  [facility] Adult Case Management Discharge Plan :  Will you be returning to the same living situation after discharge:  No. At discharge, do you have transportation home?: Yes,  Harlene (Peer Support) from [place] [facility] [employer], [phone], will pick him up at 10 AM Do you have the ability to pay for your medications: Yes,  patient has insurance  Release of information consent forms completed and in the chart;  Patient's signature needed at discharge.  Patient to Follow up at:  Follow-up Information     [facility] Follow up on 07/18/2024.   Why: You have a hospital follow up appointment for therapy and medication management services on  07/18/24 at 9:00 am .  The appointment will be Virtual, telehealth. Contact information: [address]  [address] [place] [address] [phone]         [facility] (Disability Advocacy) Follow up.   Why: A referral was sent on 07/08/2024. Please contact the provider on 07/16/2023 at 9 AM for an update on the status of the referral. Contact information: [address] [place], [place] [address] [phone]                Next level of care provider has access to [facility] Link:no  Safety Planning and Suicide Prevention discussed: Yes,  with patient  Has patient been referred to the Quitline?: Patient refused referral for treatment  Patient has been referred for addiction treatment: Patient refused referral for treatment.   Tammy Ericsson O Taegan Standage, LCSW 07/14/2024, 9:03 AM "

## 2024-07-14 NOTE — Progress Notes (Signed)
 Discharge Note:  Patient denies SI/HI/AVH at this time. Discharge instructions, AVS, prescriptions, and transition record gone over with patient. Patient agrees to adhere with medication management, follow-up visit, and outpatient therapy. Patient belongings returned to patient. Patient questions and concerns addressed and answered. Patient ambulatory off unit. Patient discharged to self care via peer support.

## 2024-07-14 NOTE — [facility] Suicide Risk Assessment (Signed)
 [facility] INPATIENT:  Family/Significant Other Suicide Prevention Education  Suicide Prevention Education:  Education Completed; with patient,  (name of family member/significant other) has been identified by the patient as the family member/significant other with whom the patient will be residing, and identified as the person(s) who will aid the patient in the event of a mental health crisis (suicidal ideations/suicide attempt).  With written consent from the patient, the family member/significant other has been provided the following suicide prevention education, prior to the and/or following the discharge of the patient.  Patient was given Suicide Prevention Information brochure.  Patient doesn't have access to firearms.    The suicide prevention education provided includes the following: Suicide risk factors Suicide prevention and interventions National Suicide Hotline telephone number [facility] assessment telephone number [facility] Emergency Assistance 911 [facility] and/or Residential Mobile Crisis Unit telephone number  Request made of family/significant other to: Remove weapons (e.g., guns, rifles, knives), all items previously/currently identified as safety concern.   Remove drugs/medications (over-the-counter, prescriptions, illicit drugs), all items previously/currently identified as a safety concern.  The family member/significant other verbalizes understanding of the suicide prevention education information provided.  The family member/significant other agrees to remove the items of safety concern listed above.  Kathrene Sinopoli O Brason Berthelot, LCSWA 07/14/2024, 9:22 AM

## 2024-07-17 ENCOUNTER — Other Ambulatory Visit: Payer: Self-pay

## 2024-07-17 ENCOUNTER — Emergency Department (HOSPITAL_COMMUNITY)
Admission: EM | Admit: 2024-07-17 | Discharge: 2024-07-18 | Disposition: A | Discharge status: Home / Self Care | Attending: Emergency Medicine | Admitting: Emergency Medicine

## 2024-07-17 DIAGNOSIS — I10 Essential (primary) hypertension: Secondary | ICD-10-CM | POA: Insufficient documentation

## 2024-07-17 DIAGNOSIS — Z760 Encounter for issue of repeat prescription: Secondary | ICD-10-CM | POA: Diagnosis present

## 2024-07-17 DIAGNOSIS — Z79899 Other long term (current) drug therapy: Secondary | ICD-10-CM | POA: Diagnosis not present

## 2024-07-17 DIAGNOSIS — Z5900 Homelessness unspecified: Secondary | ICD-10-CM | POA: Insufficient documentation

## 2024-07-17 NOTE — ED Triage Notes (Signed)
 States he has been out in the cold, anemic, has not taken his Zyprexa , is depressed and hs blood pressure is high. Also states he is SI.   States he wants to give up, Everyone is stepping up before me and speaking before my name and I believe I am becoming a waste.

## 2024-07-17 NOTE — ED Notes (Signed)
 Pt changed into paper scrubs and non-skid socks. All belongings packed in belonging bags and placed in locker 4 in purple zone. Pt wanded by security. Pt given warm blankets, sandwich bag and juice in triage 6. No other needs expressed at this time.

## 2024-07-18 LAB — CBC
HCT: 44.4 % (ref 39.0–52.0)
Hemoglobin: 14.1 g/dL (ref 13.0–17.0)
MCH: 26.3 pg (ref 26.0–34.0)
MCHC: 31.8 g/dL (ref 30.0–36.0)
MCV: 82.8 fL (ref 80.0–100.0)
Platelets: 257 K/uL (ref 150–400)
RBC: 5.36 MIL/uL (ref 4.22–5.81)
RDW: 13.7 % (ref 11.5–15.5)
WBC: 10.5 K/uL (ref 4.0–10.5)
nRBC: 0 % (ref 0.0–0.2)

## 2024-07-18 LAB — URINE DRUG SCREEN
Amphetamines: NEGATIVE
Barbiturates: NEGATIVE
Benzodiazepines: NEGATIVE
Cocaine: POSITIVE — AB
Fentanyl: NEGATIVE
Methadone Scn, Ur: NEGATIVE
Opiates: NEGATIVE
Tetrahydrocannabinol: POSITIVE — AB

## 2024-07-18 LAB — COMPREHENSIVE METABOLIC PANEL WITH GFR
ALT: 31 U/L (ref 0–44)
AST: 50 U/L — ABNORMAL HIGH (ref 15–41)
Albumin: 4.2 g/dL (ref 3.5–5.0)
Alkaline Phosphatase: 94 U/L (ref 38–126)
Anion gap: 10 (ref 5–15)
BUN: 17 mg/dL (ref 6–20)
CO2: 23 mmol/L (ref 22–32)
Calcium: 9.2 mg/dL (ref 8.9–10.3)
Chloride: 104 mmol/L (ref 98–111)
Creatinine, Ser: 1.04 mg/dL (ref 0.61–1.24)
GFR, Estimated: >60 mL/min
Glucose, Bld: 92 mg/dL (ref 70–99)
Potassium: 4 mmol/L (ref 3.5–5.1)
Sodium: 137 mmol/L (ref 135–145)
Total Bilirubin: 0.4 mg/dL (ref 0.0–1.2)
Total Protein: 6.9 g/dL (ref 6.5–8.1)

## 2024-07-18 LAB — ETHANOL: Alcohol, Ethyl (B): <15 mg/dL

## 2024-07-18 MED ORDER — HALOPERIDOL 5 MG PO TABS: ORAL_TABLET | ORAL | 0 refills | Status: AC

## 2024-07-18 MED ORDER — AMLODIPINE BESYLATE 10 MG PO TABS: 10.0000 mg | ORAL_TABLET | Freq: Every day | ORAL | 0 refills | Status: AC

## 2024-07-18 MED ORDER — OLANZAPINE 20 MG PO TBDP: 20.0000 mg | ORAL_TABLET | Freq: Every day | ORAL | 0 refills | Status: AC

## 2024-07-18 NOTE — ED Provider Notes (Signed)
 " [facility] Provider Note   CSN: [id number] Arrival date & time: 07/17/24  2244     Patient presents with: Medication Refill   Aaron Rubio is a 44 y.o. male.   44 yo male presents to the ER with concern for drug relapse. States he was trying to make new friends and now relapsed on use and this has him feeling depressed. Recently dc home from hospital for psych admission, went to the pharmacy to pick up his zyprexa  but the rx wasn't at the pharmacy. Has had difficulty sleeping for the past few months.        Prior to Admission medications  Medication Sig Start Date End Date Taking? Authorizing Provider  amLODipine  (NORVASC ) 10 MG tablet Take 1 tablet (10 mg total) by mouth daily. 07/18/24 08/17/24  Beverley Leita LABOR, PA-C  haloperidol  (HALDOL ) 5 MG tablet Take 1 tablet (5 mg total) by mouth daily AND 2 tablets (10 mg total) at bedtime. 07/18/24   Beverley Leita LABOR, PA-C  OLANZapine  zydis (ZYPREXA ) 20 MG disintegrating tablet Take 1 tablet (20 mg total) by mouth at bedtime. 07/18/24 08/17/24  Beverley Leita LABOR, PA-C    Allergies: Patient has no known allergies.    Review of Systems Negative except as per HPI Updated Vital Signs BP (!) 145/99 (BP Location: Left Arm)   Pulse 89   Temp 98.8 F (37.1 C) (Oral)   Resp 18   SpO2 100%   Physical Exam Vitals and nursing note reviewed.  Constitutional:      General: He is not in acute distress.    Appearance: He is well-developed. He is not diaphoretic.  HENT:     Head: Normocephalic and atraumatic.  Pulmonary:     Effort: Pulmonary effort is normal.  Neurological:     Mental Status: He is alert and oriented to person, place, and time.  Psychiatric:        Behavior: Behavior normal.        Thought Content: Thought content is not paranoid. Thought content does not include homicidal or suicidal ideation.     (all labs ordered are listed, but only abnormal results are  displayed) Labs Reviewed  COMPREHENSIVE METABOLIC PANEL WITH GFR - Abnormal; Notable for the following components:      Result Value   AST 50 (*)    All other components within normal limits  URINE DRUG SCREEN - Abnormal; Notable for the following components:   Cocaine POSITIVE (*)    Tetrahydrocannabinol POSITIVE (*)    All other components within normal limits  ETHANOL  CBC    EKG: None  Radiology: No results found.   Procedures   Medications Ordered in the ED - No data to display                                  Medical Decision Making Amount and/or Complexity of Data Reviewed Labs: ordered.   This patient presents to the ED for concern of med refill, this involves an extensive number of treatment options, and is a complaint that carries with it a high risk of complications and morbidity.  The differential diagnosis includes but not limited to malingering, lost rx, undomesticated    Co morbidities / Chronic conditions that complicate the patient evaluation  Polysubstance abuse, homeless, MDD, cocaine use disorder, HTN, schizophrenia    Additional history obtained:  Additional history  obtained from EMR External records from outside source obtained and reviewed including dc summary from 07/14/24   Lab Tests:  I Ordered, and personally interpreted labs.  The pertinent results include:  CBC and CMP without significant findings. Etoh negative. UDS + for cocaine and thc.    Problem List / ED Course / Critical interventions / Medication management  44 yo male presents to the ER, recently dc from behavioral health, rx sent to his pharmacy but were not there when he went to pick up. New rx sent to his pharmacy. Recommend follow up with his care team. Will provide shelter guide and outpatient substance use resources. I have reviewed the patients home medicines and have made adjustments as needed   Social Determinants of Health:  Has care team   Test / Admission  - Considered:  Stable for dc      Final diagnoses:  Medication refill    ED Discharge Orders          Ordered    OLANZapine  zydis (ZYPREXA ) 20 MG disintegrating tablet  Daily at bedtime        07/18/24 0205    haloperidol  (HALDOL ) 5 MG tablet  Multiple Frequencies        07/18/24 0205    amLODipine  (NORVASC ) 10 MG tablet  Daily        07/18/24 0205               Beverley Leita LABOR, PA-C 07/18/24 [id number]    Midge Golas, MD 07/18/24 0320  "

## 2024-07-18 NOTE — Discharge Instructions (Addendum)
 Your medications were resent to your [place] pharmacy.  Follow up with your care team.
